# Patient Record
Sex: Male | Born: 1935
Health system: Southern US, Community
[De-identification: ages and names within clinical notes are randomized; demographics above are authoritative.]

## PROBLEM LIST (undated history)

## (undated) DIAGNOSIS — J9819 Other pulmonary collapse: Secondary | ICD-10-CM

## (undated) DIAGNOSIS — T4145XA Adverse effect of unspecified anesthetic, initial encounter: Secondary | ICD-10-CM

## (undated) DIAGNOSIS — Z9289 Personal history of other medical treatment: Secondary | ICD-10-CM

## (undated) DIAGNOSIS — L97929 Non-pressure chronic ulcer of unspecified part of left lower leg with unspecified severity: Secondary | ICD-10-CM

## (undated) DIAGNOSIS — D649 Anemia, unspecified: Secondary | ICD-10-CM

## (undated) DIAGNOSIS — G629 Polyneuropathy, unspecified: Secondary | ICD-10-CM

## (undated) DIAGNOSIS — I2510 Atherosclerotic heart disease of native coronary artery without angina pectoris: Secondary | ICD-10-CM

## (undated) DIAGNOSIS — K219 Gastro-esophageal reflux disease without esophagitis: Secondary | ICD-10-CM

## (undated) DIAGNOSIS — R32 Unspecified urinary incontinence: Secondary | ICD-10-CM

## (undated) DIAGNOSIS — Z8719 Personal history of other diseases of the digestive system: Secondary | ICD-10-CM

## (undated) DIAGNOSIS — C61 Malignant neoplasm of prostate: Secondary | ICD-10-CM

## (undated) DIAGNOSIS — H3530 Unspecified macular degeneration: Secondary | ICD-10-CM

## (undated) DIAGNOSIS — M359 Systemic involvement of connective tissue, unspecified: Secondary | ICD-10-CM

## (undated) DIAGNOSIS — G709 Myoneural disorder, unspecified: Secondary | ICD-10-CM

## (undated) DIAGNOSIS — I10 Essential (primary) hypertension: Secondary | ICD-10-CM

## (undated) DIAGNOSIS — I2699 Other pulmonary embolism without acute cor pulmonale: Secondary | ICD-10-CM

## (undated) DIAGNOSIS — I499 Cardiac arrhythmia, unspecified: Secondary | ICD-10-CM

## (undated) DIAGNOSIS — I495 Sick sinus syndrome: Secondary | ICD-10-CM

## (undated) DIAGNOSIS — M5137 Other intervertebral disc degeneration, lumbosacral region: Secondary | ICD-10-CM

## (undated) DIAGNOSIS — M51379 Other intervertebral disc degeneration, lumbosacral region without mention of lumbar back pain or lower extremity pain: Secondary | ICD-10-CM

## (undated) DIAGNOSIS — I219 Acute myocardial infarction, unspecified: Secondary | ICD-10-CM

## (undated) DIAGNOSIS — I209 Angina pectoris, unspecified: Secondary | ICD-10-CM

## (undated) DIAGNOSIS — F329 Major depressive disorder, single episode, unspecified: Secondary | ICD-10-CM

## (undated) DIAGNOSIS — R600 Localized edema: Secondary | ICD-10-CM

## (undated) DIAGNOSIS — R296 Repeated falls: Secondary | ICD-10-CM

## (undated) DIAGNOSIS — IMO0001 Reserved for inherently not codable concepts without codable children: Secondary | ICD-10-CM

## (undated) DIAGNOSIS — T8859XA Other complications of anesthesia, initial encounter: Secondary | ICD-10-CM

## (undated) DIAGNOSIS — F3289 Other specified depressive episodes: Secondary | ICD-10-CM

## (undated) DIAGNOSIS — Z9989 Dependence on other enabling machines and devices: Secondary | ICD-10-CM

## (undated) DIAGNOSIS — I509 Heart failure, unspecified: Secondary | ICD-10-CM

## (undated) DIAGNOSIS — Z950 Presence of cardiac pacemaker: Secondary | ICD-10-CM

## (undated) DIAGNOSIS — E7800 Pure hypercholesterolemia, unspecified: Secondary | ICD-10-CM

## (undated) DIAGNOSIS — L97919 Non-pressure chronic ulcer of unspecified part of right lower leg with unspecified severity: Secondary | ICD-10-CM

## (undated) DIAGNOSIS — G4733 Obstructive sleep apnea (adult) (pediatric): Secondary | ICD-10-CM

## (undated) DIAGNOSIS — C8590 Non-Hodgkin lymphoma, unspecified, unspecified site: Secondary | ICD-10-CM

## (undated) HISTORY — PX: PACEMAKER INSERTION: SHX728

## (undated) HISTORY — PX: BACK SURGERY: SHX140

## (undated) HISTORY — PX: ENDARTERECTOMY: SHX5162

## (undated) HISTORY — DX: Other intervertebral disc degeneration, lumbosacral region: M51.37

## (undated) HISTORY — PX: EXPLORATORY LAPAROTOMY: SUR591

## (undated) HISTORY — DX: Major depressive disorder, single episode, unspecified: F32.9

## (undated) HISTORY — DX: Sick sinus syndrome: I49.5

## (undated) HISTORY — DX: Pure hypercholesterolemia, unspecified: E78.00

## (undated) HISTORY — DX: Essential (primary) hypertension: I10

## (undated) HISTORY — DX: Other specified depressive episodes: F32.89

## (undated) HISTORY — PX: ROTATOR CUFF REPAIR: SHX139

## (undated) HISTORY — PX: CORONARY ANGIOPLASTY WITH STENT PLACEMENT: SHX49

## (undated) HISTORY — DX: Atherosclerotic heart disease of native coronary artery without angina pectoris: I25.10

## (undated) HISTORY — PX: APPENDECTOMY: SHX54

## (undated) HISTORY — PX: TONSILLECTOMY: SUR1361

## (undated) HISTORY — PX: CORONARY ARTERY BYPASS GRAFT: SHX141

## (undated) HISTORY — PX: LUMBAR FUSION: SHX111

## (undated) HISTORY — DX: Other intervertebral disc degeneration, lumbosacral region without mention of lumbar back pain or lower extremity pain: M51.379

---

## 1997-11-20 ENCOUNTER — Other Ambulatory Visit: Admission: RE | Admit: 1997-11-20 | Discharge: 1997-11-20 | Payer: Self-pay | Admitting: Geriatric Medicine

## 1997-12-06 ENCOUNTER — Ambulatory Visit (HOSPITAL_COMMUNITY): Admission: RE | Admit: 1997-12-06 | Discharge: 1997-12-06 | Payer: Self-pay | Admitting: Neurosurgery

## 1998-01-22 ENCOUNTER — Ambulatory Visit (HOSPITAL_COMMUNITY): Admission: RE | Admit: 1998-01-22 | Discharge: 1998-01-22 | Payer: Self-pay | Admitting: Neurosurgery

## 1998-11-11 ENCOUNTER — Ambulatory Visit (HOSPITAL_COMMUNITY): Admission: AD | Admit: 1998-11-11 | Discharge: 1998-11-12 | Payer: Self-pay | Admitting: Cardiology

## 1999-11-06 ENCOUNTER — Ambulatory Visit (HOSPITAL_COMMUNITY): Admission: RE | Admit: 1999-11-06 | Discharge: 1999-11-06 | Payer: Self-pay | Admitting: Geriatric Medicine

## 1999-11-22 ENCOUNTER — Encounter: Payer: Self-pay | Admitting: *Deleted

## 1999-11-22 ENCOUNTER — Inpatient Hospital Stay (HOSPITAL_COMMUNITY): Admission: EM | Admit: 1999-11-22 | Discharge: 1999-11-26 | Payer: Self-pay | Admitting: *Deleted

## 1999-11-26 ENCOUNTER — Encounter: Payer: Self-pay | Admitting: Cardiology

## 2000-01-01 ENCOUNTER — Ambulatory Visit (HOSPITAL_COMMUNITY): Admission: RE | Admit: 2000-01-01 | Discharge: 2000-01-01 | Payer: Self-pay | Admitting: Psychiatry

## 2000-03-25 ENCOUNTER — Ambulatory Visit (HOSPITAL_COMMUNITY): Admission: RE | Admit: 2000-03-25 | Discharge: 2000-03-25 | Payer: Self-pay | Admitting: Cardiology

## 2000-03-25 ENCOUNTER — Encounter: Payer: Self-pay | Admitting: Cardiology

## 2000-04-27 ENCOUNTER — Ambulatory Visit (HOSPITAL_COMMUNITY): Admission: RE | Admit: 2000-04-27 | Discharge: 2000-04-28 | Payer: Self-pay | Admitting: Cardiology

## 2000-07-07 ENCOUNTER — Other Ambulatory Visit: Admission: RE | Admit: 2000-07-07 | Discharge: 2000-07-07 | Payer: Self-pay | Admitting: Urology

## 2000-07-07 ENCOUNTER — Encounter (INDEPENDENT_AMBULATORY_CARE_PROVIDER_SITE_OTHER): Payer: Self-pay

## 2000-08-05 ENCOUNTER — Ambulatory Visit (HOSPITAL_BASED_OUTPATIENT_CLINIC_OR_DEPARTMENT_OTHER): Admission: RE | Admit: 2000-08-05 | Discharge: 2000-08-05 | Payer: Self-pay | Admitting: Cardiology

## 2000-11-15 ENCOUNTER — Ambulatory Visit (HOSPITAL_COMMUNITY): Admission: RE | Admit: 2000-11-15 | Discharge: 2000-11-15 | Payer: Self-pay | Admitting: Cardiology

## 2001-04-01 ENCOUNTER — Encounter: Admission: RE | Admit: 2001-04-01 | Discharge: 2001-04-01 | Payer: Self-pay | Admitting: Geriatric Medicine

## 2001-04-01 ENCOUNTER — Encounter: Payer: Self-pay | Admitting: Geriatric Medicine

## 2001-09-19 ENCOUNTER — Encounter: Payer: Self-pay | Admitting: Urology

## 2001-09-19 ENCOUNTER — Encounter: Admission: RE | Admit: 2001-09-19 | Discharge: 2001-09-19 | Payer: Self-pay | Admitting: Urology

## 2002-01-13 ENCOUNTER — Encounter (INDEPENDENT_AMBULATORY_CARE_PROVIDER_SITE_OTHER): Payer: Self-pay | Admitting: *Deleted

## 2002-01-13 ENCOUNTER — Inpatient Hospital Stay (HOSPITAL_COMMUNITY): Admission: RE | Admit: 2002-01-13 | Discharge: 2002-01-14 | Payer: Self-pay | Admitting: Vascular Surgery

## 2002-01-13 ENCOUNTER — Encounter: Payer: Self-pay | Admitting: Vascular Surgery

## 2002-05-30 ENCOUNTER — Encounter: Payer: Self-pay | Admitting: Geriatric Medicine

## 2002-05-30 ENCOUNTER — Encounter: Admission: RE | Admit: 2002-05-30 | Discharge: 2002-05-30 | Payer: Self-pay | Admitting: Geriatric Medicine

## 2002-06-20 ENCOUNTER — Encounter: Payer: Self-pay | Admitting: Neurological Surgery

## 2002-06-20 ENCOUNTER — Inpatient Hospital Stay (HOSPITAL_COMMUNITY): Admission: RE | Admit: 2002-06-20 | Discharge: 2002-06-29 | Payer: Self-pay | Admitting: Neurological Surgery

## 2003-08-07 ENCOUNTER — Ambulatory Visit (HOSPITAL_COMMUNITY): Admission: RE | Admit: 2003-08-07 | Discharge: 2003-08-07 | Payer: Self-pay | Admitting: Geriatric Medicine

## 2004-01-14 ENCOUNTER — Ambulatory Visit: Admission: RE | Admit: 2004-01-14 | Discharge: 2004-02-29 | Payer: Self-pay | Admitting: Radiation Oncology

## 2004-01-17 ENCOUNTER — Ambulatory Visit (HOSPITAL_COMMUNITY): Admission: RE | Admit: 2004-01-17 | Discharge: 2004-01-17 | Payer: Self-pay | Admitting: Urology

## 2004-08-14 ENCOUNTER — Encounter: Admission: RE | Admit: 2004-08-14 | Discharge: 2004-08-14 | Payer: Self-pay | Admitting: Neurology

## 2004-11-06 ENCOUNTER — Ambulatory Visit (HOSPITAL_COMMUNITY): Admission: RE | Admit: 2004-11-06 | Discharge: 2004-11-06 | Payer: Self-pay | Admitting: Urology

## 2004-11-28 ENCOUNTER — Encounter (HOSPITAL_COMMUNITY): Admission: RE | Admit: 2004-11-28 | Discharge: 2004-12-05 | Payer: Self-pay | Admitting: Urology

## 2004-12-03 ENCOUNTER — Ambulatory Visit: Payer: Self-pay | Admitting: Oncology

## 2004-12-09 ENCOUNTER — Ambulatory Visit: Admission: RE | Admit: 2004-12-09 | Discharge: 2005-01-30 | Payer: Self-pay | Admitting: Radiation Oncology

## 2005-11-23 ENCOUNTER — Inpatient Hospital Stay (HOSPITAL_COMMUNITY): Admission: EM | Admit: 2005-11-23 | Discharge: 2005-11-24 | Payer: Self-pay | Admitting: Emergency Medicine

## 2006-04-20 ENCOUNTER — Ambulatory Visit: Payer: Self-pay | Admitting: Oncology

## 2006-04-22 LAB — CBC WITH DIFFERENTIAL/PLATELET
BASO%: 0.3 % (ref 0.0–2.0)
HCT: 32.7 % — ABNORMAL LOW (ref 38.7–49.9)
MCHC: 34.5 g/dL (ref 32.0–35.9)
MONO#: 0.5 10*3/uL (ref 0.1–0.9)
NEUT#: 3.6 10*3/uL (ref 1.5–6.5)
NEUT%: 60 % (ref 40.0–75.0)
WBC: 6 10*3/uL (ref 4.0–10.0)
lymph#: 1.6 10*3/uL (ref 0.9–3.3)

## 2006-04-23 LAB — CK: Total CK: 341 U/L — ABNORMAL HIGH (ref 7–232)

## 2006-04-26 LAB — FERRITIN: Ferritin: 43 ng/mL (ref 22–322)

## 2006-04-26 LAB — COMPREHENSIVE METABOLIC PANEL
Albumin: 4.2 g/dL (ref 3.5–5.2)
BUN: 34 mg/dL — ABNORMAL HIGH (ref 6–23)
CO2: 28 mEq/L (ref 19–32)
Glucose, Bld: 92 mg/dL (ref 70–99)
Sodium: 140 mEq/L (ref 135–145)
Total Bilirubin: 0.5 mg/dL (ref 0.3–1.2)
Total Protein: 7.1 g/dL (ref 6.0–8.3)

## 2006-04-26 LAB — IMMUNOFIXATION ELECTROPHORESIS
IgA: 584 mg/dL — ABNORMAL HIGH (ref 68–378)
Total Protein, Serum Electrophoresis: 7.1 g/dL (ref 6.0–8.3)

## 2006-04-26 LAB — LACTATE DEHYDROGENASE: LDH: 209 U/L (ref 94–250)

## 2006-05-03 ENCOUNTER — Encounter: Admission: RE | Admit: 2006-05-03 | Discharge: 2006-05-03 | Payer: Self-pay | Admitting: Geriatric Medicine

## 2006-05-06 LAB — COMPREHENSIVE METABOLIC PANEL
ALT: 24 U/L (ref 0–40)
BUN: 24 mg/dL — ABNORMAL HIGH (ref 6–23)
CO2: 29 mEq/L (ref 19–32)
Calcium: 10 mg/dL (ref 8.4–10.5)
Chloride: 106 mEq/L (ref 96–112)
Creatinine, Ser: 1.36 mg/dL (ref 0.40–1.50)
Glucose, Bld: 108 mg/dL — ABNORMAL HIGH (ref 70–99)

## 2006-05-06 LAB — CBC WITH DIFFERENTIAL/PLATELET
BASO%: 0.3 % (ref 0.0–2.0)
Basophils Absolute: 0 10*3/uL (ref 0.0–0.1)
Eosinophils Absolute: 0.3 10*3/uL (ref 0.0–0.5)
HCT: 31.1 % — ABNORMAL LOW (ref 38.7–49.9)
HGB: 10.6 g/dL — ABNORMAL LOW (ref 13.0–17.1)
MONO#: 0.5 10*3/uL (ref 0.1–0.9)
NEUT#: 3.3 10*3/uL (ref 1.5–6.5)
NEUT%: 61.6 % (ref 40.0–75.0)
Platelets: 152 10*3/uL (ref 145–400)
WBC: 5.4 10*3/uL (ref 4.0–10.0)
lymph#: 1.2 10*3/uL (ref 0.9–3.3)

## 2006-05-18 ENCOUNTER — Encounter (INDEPENDENT_AMBULATORY_CARE_PROVIDER_SITE_OTHER): Payer: Self-pay | Admitting: Specialist

## 2006-05-18 ENCOUNTER — Encounter (INDEPENDENT_AMBULATORY_CARE_PROVIDER_SITE_OTHER): Payer: Self-pay | Admitting: Interventional Radiology

## 2006-05-18 ENCOUNTER — Ambulatory Visit (HOSPITAL_COMMUNITY): Admission: RE | Admit: 2006-05-18 | Discharge: 2006-05-18 | Payer: Self-pay | Admitting: Oncology

## 2006-05-25 ENCOUNTER — Encounter (INDEPENDENT_AMBULATORY_CARE_PROVIDER_SITE_OTHER): Payer: Self-pay | Admitting: Specialist

## 2006-05-25 ENCOUNTER — Other Ambulatory Visit: Admission: RE | Admit: 2006-05-25 | Discharge: 2006-05-25 | Payer: Self-pay | Admitting: Oncology

## 2006-05-25 ENCOUNTER — Encounter: Payer: Self-pay | Admitting: Oncology

## 2006-06-07 ENCOUNTER — Ambulatory Visit: Payer: Self-pay | Admitting: Oncology

## 2006-07-06 ENCOUNTER — Encounter (INDEPENDENT_AMBULATORY_CARE_PROVIDER_SITE_OTHER): Payer: Self-pay | Admitting: Interventional Radiology

## 2006-07-06 ENCOUNTER — Encounter (INDEPENDENT_AMBULATORY_CARE_PROVIDER_SITE_OTHER): Payer: Self-pay | Admitting: Specialist

## 2006-07-06 ENCOUNTER — Ambulatory Visit (HOSPITAL_COMMUNITY): Admission: RE | Admit: 2006-07-06 | Discharge: 2006-07-06 | Payer: Self-pay | Admitting: Oncology

## 2006-07-14 LAB — COMPREHENSIVE METABOLIC PANEL
BUN: 37 mg/dL — ABNORMAL HIGH (ref 6–23)
CO2: 29 mEq/L (ref 19–32)
Calcium: 10.2 mg/dL (ref 8.4–10.5)
Chloride: 104 mEq/L (ref 96–112)
Creatinine, Ser: 1.52 mg/dL — ABNORMAL HIGH (ref 0.40–1.50)
Total Bilirubin: 0.5 mg/dL (ref 0.3–1.2)

## 2006-07-14 LAB — CBC WITH DIFFERENTIAL/PLATELET
Basophils Absolute: 0 10*3/uL (ref 0.0–0.1)
HCT: 31.4 % — ABNORMAL LOW (ref 38.7–49.9)
HGB: 10.7 g/dL — ABNORMAL LOW (ref 13.0–17.1)
LYMPH%: 29.4 % (ref 14.0–48.0)
MCH: 30.6 pg (ref 28.0–33.4)
MCHC: 34.1 g/dL (ref 32.0–35.9)
MONO#: 0.7 10*3/uL (ref 0.1–0.9)
NEUT%: 55.2 % (ref 40.0–75.0)
Platelets: 145 10*3/uL (ref 145–400)
lymph#: 1.7 10*3/uL (ref 0.9–3.3)

## 2006-07-28 ENCOUNTER — Ambulatory Visit (HOSPITAL_COMMUNITY): Admission: RE | Admit: 2006-07-28 | Discharge: 2006-07-28 | Payer: Self-pay | Admitting: Oncology

## 2006-07-29 ENCOUNTER — Ambulatory Visit: Payer: Self-pay | Admitting: Oncology

## 2006-08-03 LAB — CBC WITH DIFFERENTIAL/PLATELET
Eosinophils Absolute: 0.2 10*3/uL (ref 0.0–0.5)
HCT: 32.2 % — ABNORMAL LOW (ref 38.7–49.9)
LYMPH%: 25.8 % (ref 14.0–48.0)
MONO#: 0.5 10*3/uL (ref 0.1–0.9)
NEUT#: 3.7 10*3/uL (ref 1.5–6.5)
Platelets: 135 10*3/uL — ABNORMAL LOW (ref 145–400)
RBC: 3.61 10*6/uL — ABNORMAL LOW (ref 4.20–5.71)
WBC: 5.9 10*3/uL (ref 4.0–10.0)
lymph#: 1.5 10*3/uL (ref 0.9–3.3)

## 2006-08-03 LAB — COMPREHENSIVE METABOLIC PANEL
ALT: 16 U/L (ref 0–53)
Albumin: 4.2 g/dL (ref 3.5–5.2)
CO2: 28 mEq/L (ref 19–32)
Calcium: 10.4 mg/dL (ref 8.4–10.5)
Chloride: 102 mEq/L (ref 96–112)
Glucose, Bld: 123 mg/dL — ABNORMAL HIGH (ref 70–99)
Potassium: 4.3 mEq/L (ref 3.5–5.3)
Sodium: 139 mEq/L (ref 135–145)
Total Bilirubin: 0.4 mg/dL (ref 0.3–1.2)
Total Protein: 6.9 g/dL (ref 6.0–8.3)

## 2006-10-19 ENCOUNTER — Inpatient Hospital Stay (HOSPITAL_COMMUNITY): Admission: AD | Admit: 2006-10-19 | Discharge: 2006-10-23 | Payer: Self-pay | Admitting: Cardiology

## 2006-10-20 ENCOUNTER — Ambulatory Visit: Payer: Self-pay | Admitting: Cardiothoracic Surgery

## 2006-10-28 ENCOUNTER — Ambulatory Visit: Payer: Self-pay | Admitting: Oncology

## 2006-11-02 LAB — CBC WITH DIFFERENTIAL/PLATELET
BASO%: 0.2 % (ref 0.0–2.0)
Eosinophils Absolute: 0.2 10*3/uL (ref 0.0–0.5)
MCHC: 35.2 g/dL (ref 32.0–35.9)
MONO#: 0.6 10*3/uL (ref 0.1–0.9)
NEUT#: 2.9 10*3/uL (ref 1.5–6.5)
RBC: 3.43 10*6/uL — ABNORMAL LOW (ref 4.20–5.71)
RDW: 14.9 % — ABNORMAL HIGH (ref 11.2–14.6)
WBC: 5.1 10*3/uL (ref 4.0–10.0)
lymph#: 1.4 10*3/uL (ref 0.9–3.3)

## 2006-11-02 LAB — COMPREHENSIVE METABOLIC PANEL
ALT: 15 U/L (ref 0–53)
Albumin: 4 g/dL (ref 3.5–5.2)
CO2: 24 mEq/L (ref 19–32)
Glucose, Bld: 94 mg/dL (ref 70–99)
Potassium: 4.6 mEq/L (ref 3.5–5.3)
Sodium: 140 mEq/L (ref 135–145)
Total Bilirubin: 0.4 mg/dL (ref 0.3–1.2)
Total Protein: 6.2 g/dL (ref 6.0–8.3)

## 2007-01-27 ENCOUNTER — Ambulatory Visit: Payer: Self-pay | Admitting: Oncology

## 2007-02-01 LAB — CBC WITH DIFFERENTIAL/PLATELET
BASO%: 0.3 % (ref 0.0–2.0)
Basophils Absolute: 0 10*3/uL (ref 0.0–0.1)
EOS%: 3 % (ref 0.0–7.0)
HGB: 11.1 g/dL — ABNORMAL LOW (ref 13.0–17.1)
MCH: 31 pg (ref 28.0–33.4)
MCHC: 35.1 g/dL (ref 32.0–35.9)
MONO%: 11.3 % (ref 0.0–13.0)
RBC: 3.59 10*6/uL — ABNORMAL LOW (ref 4.20–5.71)
RDW: 15.2 % — ABNORMAL HIGH (ref 11.2–14.6)
lymph#: 1.3 10*3/uL (ref 0.9–3.3)

## 2007-02-01 LAB — BASIC METABOLIC PANEL
Chloride: 106 mEq/L (ref 96–112)
Potassium: 4.1 mEq/L (ref 3.5–5.3)
Sodium: 140 mEq/L (ref 135–145)

## 2007-02-01 LAB — LACTATE DEHYDROGENASE: LDH: 209 U/L (ref 94–250)

## 2007-03-23 ENCOUNTER — Ambulatory Visit: Payer: Self-pay | Admitting: Oncology

## 2007-03-25 LAB — COMPREHENSIVE METABOLIC PANEL
ALT: 17 U/L (ref 0–53)
CO2: 26 mEq/L (ref 19–32)
Calcium: 10.3 mg/dL (ref 8.4–10.5)
Chloride: 102 mEq/L (ref 96–112)
Creatinine, Ser: 1.66 mg/dL — ABNORMAL HIGH (ref 0.40–1.50)
Glucose, Bld: 103 mg/dL — ABNORMAL HIGH (ref 70–99)
Sodium: 138 mEq/L (ref 135–145)
Total Bilirubin: 0.6 mg/dL (ref 0.3–1.2)
Total Protein: 6.6 g/dL (ref 6.0–8.3)

## 2007-03-25 LAB — CBC WITH DIFFERENTIAL/PLATELET
BASO%: 0.3 % (ref 0.0–2.0)
Eosinophils Absolute: 0.2 10*3/uL (ref 0.0–0.5)
HCT: 32.1 % — ABNORMAL LOW (ref 38.7–49.9)
HGB: 11.2 g/dL — ABNORMAL LOW (ref 13.0–17.1)
LYMPH%: 24.5 % (ref 14.0–48.0)
MONO#: 0.5 10*3/uL (ref 0.1–0.9)
NEUT#: 3 10*3/uL (ref 1.5–6.5)
NEUT%: 61.7 % (ref 40.0–75.0)
Platelets: 125 10*3/uL — ABNORMAL LOW (ref 145–400)
WBC: 4.8 10*3/uL (ref 4.0–10.0)
lymph#: 1.2 10*3/uL (ref 0.9–3.3)

## 2007-03-30 ENCOUNTER — Ambulatory Visit (HOSPITAL_COMMUNITY): Admission: RE | Admit: 2007-03-30 | Discharge: 2007-03-30 | Payer: Self-pay | Admitting: Oncology

## 2007-05-09 ENCOUNTER — Ambulatory Visit (HOSPITAL_COMMUNITY): Admission: RE | Admit: 2007-05-09 | Discharge: 2007-05-09 | Payer: Self-pay | Admitting: Cardiology

## 2007-05-12 ENCOUNTER — Ambulatory Visit: Payer: Self-pay | Admitting: Oncology

## 2007-05-17 LAB — CBC WITH DIFFERENTIAL/PLATELET
Basophils Absolute: 0 10*3/uL (ref 0.0–0.1)
Eosinophils Absolute: 0.1 10*3/uL (ref 0.0–0.5)
HGB: 10.7 g/dL — ABNORMAL LOW (ref 13.0–17.1)
MCV: 88.7 fL (ref 81.6–98.0)
MONO#: 0.5 10*3/uL (ref 0.1–0.9)
MONO%: 9.6 % (ref 0.0–13.0)
NEUT#: 3.4 10*3/uL (ref 1.5–6.5)
Platelets: 125 10*3/uL — ABNORMAL LOW (ref 145–400)
RDW: 15.1 % — ABNORMAL HIGH (ref 11.2–14.6)
WBC: 5.2 10*3/uL (ref 4.0–10.0)

## 2007-05-17 LAB — BASIC METABOLIC PANEL
BUN: 26 mg/dL — ABNORMAL HIGH (ref 6–23)
CO2: 27 mEq/L (ref 19–32)
Glucose, Bld: 81 mg/dL (ref 70–99)
Potassium: 4.6 mEq/L (ref 3.5–5.3)
Sodium: 142 mEq/L (ref 135–145)

## 2007-06-21 ENCOUNTER — Ambulatory Visit: Payer: Self-pay | Admitting: Oncology

## 2007-06-21 LAB — COMPREHENSIVE METABOLIC PANEL
ALT: 19 U/L (ref 0–53)
Alkaline Phosphatase: 58 U/L (ref 39–117)
CO2: 31 mEq/L (ref 19–32)
Creatinine, Ser: 1.47 mg/dL (ref 0.40–1.50)
Sodium: 138 mEq/L (ref 135–145)
Total Bilirubin: 0.7 mg/dL (ref 0.3–1.2)
Total Protein: 6.3 g/dL (ref 6.0–8.3)

## 2007-06-21 LAB — CBC WITH DIFFERENTIAL/PLATELET
BASO%: 0.7 % (ref 0.0–2.0)
EOS%: 3.9 % (ref 0.0–7.0)
HCT: 32 % — ABNORMAL LOW (ref 38.7–49.9)
LYMPH%: 27.7 % (ref 14.0–48.0)
MCH: 31.1 pg (ref 28.0–33.4)
MCHC: 34.9 g/dL (ref 32.0–35.9)
MCV: 89.2 fL (ref 81.6–98.0)
MONO#: 0.5 10*3/uL (ref 0.1–0.9)
MONO%: 9.3 % (ref 0.0–13.0)
NEUT%: 58.4 % (ref 40.0–75.0)
Platelets: 116 10*3/uL — ABNORMAL LOW (ref 145–400)
RBC: 3.59 10*6/uL — ABNORMAL LOW (ref 4.20–5.71)
WBC: 5.2 10*3/uL (ref 4.0–10.0)

## 2007-06-24 LAB — LACTATE DEHYDROGENASE: LDH: 174 U/L (ref 94–250)

## 2007-06-24 LAB — COMPREHENSIVE METABOLIC PANEL
AST: 31 U/L (ref 0–37)
Alkaline Phosphatase: 58 U/L (ref 39–117)
BUN: 30 mg/dL — ABNORMAL HIGH (ref 6–23)
Calcium: 10.4 mg/dL (ref 8.4–10.5)
Chloride: 105 mEq/L (ref 96–112)
Creatinine, Ser: 1.71 mg/dL — ABNORMAL HIGH (ref 0.40–1.50)

## 2007-06-29 LAB — PTH RELATED PEPTIDE: PTH-related peptide: <0.2 pmol/L (ref <1.4)

## 2007-07-11 LAB — CBC WITH DIFFERENTIAL/PLATELET
BASO%: 0.3 % (ref 0.0–2.0)
HCT: 31.7 % — ABNORMAL LOW (ref 38.7–49.9)
MCHC: 35.3 g/dL (ref 32.0–35.9)
MONO#: 0.3 10*3/uL (ref 0.1–0.9)
NEUT%: 60.6 % (ref 40.0–75.0)
WBC: 3.2 10*3/uL — ABNORMAL LOW (ref 4.0–10.0)
lymph#: 0.9 10*3/uL (ref 0.9–3.3)

## 2007-07-20 LAB — COMPREHENSIVE METABOLIC PANEL
ALT: 27 U/L (ref 0–53)
Albumin: 4 g/dL (ref 3.5–5.2)
Alkaline Phosphatase: 57 U/L (ref 39–117)
Glucose, Bld: 101 mg/dL — ABNORMAL HIGH (ref 70–99)
Potassium: 4.4 mEq/L (ref 3.5–5.3)
Sodium: 141 mEq/L (ref 135–145)
Total Protein: 6.5 g/dL (ref 6.0–8.3)

## 2007-07-20 LAB — CBC WITH DIFFERENTIAL/PLATELET
Basophils Absolute: 0 10*3/uL (ref 0.0–0.1)
Eosinophils Absolute: 0.1 10*3/uL (ref 0.0–0.5)
HCT: 31.4 % — ABNORMAL LOW (ref 38.7–49.9)
HGB: 11 g/dL — ABNORMAL LOW (ref 13.0–17.1)
MCH: 30.8 pg (ref 28.0–33.4)
MONO#: 0.6 10*3/uL (ref 0.1–0.9)
NEUT#: 1.4 10*3/uL — ABNORMAL LOW (ref 1.5–6.5)
NEUT%: 37 % — ABNORMAL LOW (ref 40.0–75.0)
WBC: 3.8 10*3/uL — ABNORMAL LOW (ref 4.0–10.0)
lymph#: 1.6 10*3/uL (ref 0.9–3.3)

## 2007-07-22 LAB — CBC WITH DIFFERENTIAL/PLATELET
BASO%: 0.7 % (ref 0.0–2.0)
EOS%: 3.6 % (ref 0.0–7.0)
Eosinophils Absolute: 0.1 10*3/uL (ref 0.0–0.5)
LYMPH%: 33.2 % (ref 14.0–48.0)
MCH: 30.8 pg (ref 28.0–33.4)
MCHC: 34.9 g/dL (ref 32.0–35.9)
MCV: 88.2 fL (ref 81.6–98.0)
MONO%: 11.9 % (ref 0.0–13.0)
NEUT#: 2.1 10*3/uL (ref 1.5–6.5)
Platelets: 143 10*3/uL — ABNORMAL LOW (ref 145–400)
RBC: 3.42 10*6/uL — ABNORMAL LOW (ref 4.20–5.71)
RDW: 12.1 % (ref 11.2–14.6)

## 2007-08-08 ENCOUNTER — Ambulatory Visit: Payer: Self-pay | Admitting: Oncology

## 2007-08-09 ENCOUNTER — Ambulatory Visit: Payer: Self-pay | Admitting: Dentistry

## 2007-08-09 ENCOUNTER — Encounter: Admission: AD | Admit: 2007-08-09 | Discharge: 2007-08-09 | Payer: Self-pay | Admitting: Dentistry

## 2007-08-10 LAB — COMPREHENSIVE METABOLIC PANEL
ALT: 23 U/L (ref 0–53)
Albumin: 3.9 g/dL (ref 3.5–5.2)
Alkaline Phosphatase: 54 U/L (ref 39–117)
CO2: 26 mEq/L (ref 19–32)
Glucose, Bld: 136 mg/dL — ABNORMAL HIGH (ref 70–99)
Potassium: 4.2 mEq/L (ref 3.5–5.3)
Sodium: 141 mEq/L (ref 135–145)
Total Bilirubin: 0.4 mg/dL (ref 0.3–1.2)
Total Protein: 6.4 g/dL (ref 6.0–8.3)

## 2007-08-10 LAB — CBC WITH DIFFERENTIAL/PLATELET
BASO%: 0.5 % (ref 0.0–2.0)
Eosinophils Absolute: 0.2 10*3/uL (ref 0.0–0.5)
LYMPH%: 39.5 % (ref 14.0–48.0)
MCHC: 34.7 g/dL (ref 32.0–35.9)
MONO#: 0.6 10*3/uL (ref 0.1–0.9)
NEUT#: 1.6 10*3/uL (ref 1.5–6.5)
RBC: 3.6 10*6/uL — ABNORMAL LOW (ref 4.20–5.71)
RDW: 16 % — ABNORMAL HIGH (ref 11.2–14.6)
WBC: 3.9 10*3/uL — ABNORMAL LOW (ref 4.0–10.0)

## 2007-08-11 ENCOUNTER — Ambulatory Visit (HOSPITAL_COMMUNITY): Admission: RE | Admit: 2007-08-11 | Discharge: 2007-08-11 | Payer: Self-pay | Admitting: Oncology

## 2007-09-01 LAB — COMPREHENSIVE METABOLIC PANEL
ALT: 27 U/L (ref 0–53)
Albumin: 4.1 g/dL (ref 3.5–5.2)
Alkaline Phosphatase: 64 U/L (ref 39–117)
Glucose, Bld: 107 mg/dL — ABNORMAL HIGH (ref 70–99)
Potassium: 4.1 mEq/L (ref 3.5–5.3)
Sodium: 139 mEq/L (ref 135–145)
Total Protein: 6.6 g/dL (ref 6.0–8.3)

## 2007-09-01 LAB — CBC WITH DIFFERENTIAL/PLATELET
BASO%: 0.2 % (ref 0.0–2.0)
Eosinophils Absolute: 0.2 10*3/uL (ref 0.0–0.5)
MCHC: 35.8 g/dL (ref 32.0–35.9)
MONO#: 0.8 10*3/uL (ref 0.1–0.9)
MONO%: 16 % — ABNORMAL HIGH (ref 0.0–13.0)
NEUT#: 2.5 10*3/uL (ref 1.5–6.5)
RBC: 3.45 10*6/uL — ABNORMAL LOW (ref 4.20–5.71)
RDW: 17.7 % — ABNORMAL HIGH (ref 11.2–14.6)
WBC: 5.3 10*3/uL (ref 4.0–10.0)

## 2007-09-19 ENCOUNTER — Ambulatory Visit (HOSPITAL_COMMUNITY): Admission: RE | Admit: 2007-09-19 | Discharge: 2007-09-19 | Payer: Self-pay | Admitting: Oncology

## 2007-09-20 ENCOUNTER — Ambulatory Visit: Payer: Self-pay | Admitting: Oncology

## 2007-09-22 LAB — COMPREHENSIVE METABOLIC PANEL
ALT: 22 U/L (ref 0–53)
AST: 26 U/L (ref 0–37)
BUN: 26 mg/dL — ABNORMAL HIGH (ref 6–23)
Calcium: 10 mg/dL (ref 8.4–10.5)
Chloride: 102 mEq/L (ref 96–112)
Creatinine, Ser: 1.43 mg/dL (ref 0.40–1.50)
Total Bilirubin: 0.5 mg/dL (ref 0.3–1.2)

## 2007-09-22 LAB — CBC WITH DIFFERENTIAL/PLATELET
BASO%: 0.2 % (ref 0.0–2.0)
EOS%: 5.7 % (ref 0.0–7.0)
Eosinophils Absolute: 0.3 10*3/uL (ref 0.0–0.5)
LYMPH%: 26.4 % (ref 14.0–48.0)
MCH: 31.6 pg (ref 28.0–33.4)
MCHC: 35.4 g/dL (ref 32.0–35.9)
MCV: 89.3 fL (ref 81.6–98.0)
MONO%: 7.9 % (ref 0.0–13.0)
NEUT#: 3.3 10*3/uL (ref 1.5–6.5)
Platelets: 245 10*3/uL (ref 145–400)
RBC: 3.23 10*6/uL — ABNORMAL LOW (ref 4.20–5.71)
RDW: 17.2 % — ABNORMAL HIGH (ref 11.2–14.6)

## 2007-11-16 ENCOUNTER — Ambulatory Visit: Payer: Self-pay | Admitting: Oncology

## 2007-11-18 LAB — CBC WITH DIFFERENTIAL/PLATELET
Eosinophils Absolute: 0.3 10*3/uL (ref 0.0–0.5)
MONO#: 0.5 10*3/uL (ref 0.1–0.9)
NEUT#: 3.4 10*3/uL (ref 1.5–6.5)
Platelets: 163 10*3/uL (ref 145–400)
RBC: 3.08 10*6/uL — ABNORMAL LOW (ref 4.20–5.71)
RDW: 17.1 % — ABNORMAL HIGH (ref 11.2–14.6)
WBC: 5.1 10*3/uL (ref 4.0–10.0)
lymph#: 0.9 10*3/uL (ref 0.9–3.3)

## 2007-11-18 LAB — COMPREHENSIVE METABOLIC PANEL
Albumin: 4 g/dL (ref 3.5–5.2)
CO2: 24 mEq/L (ref 19–32)
Chloride: 106 mEq/L (ref 96–112)
Glucose, Bld: 107 mg/dL — ABNORMAL HIGH (ref 70–99)
Potassium: 3.9 mEq/L (ref 3.5–5.3)
Sodium: 139 mEq/L (ref 135–145)
Total Protein: 6.4 g/dL (ref 6.0–8.3)

## 2008-01-04 ENCOUNTER — Ambulatory Visit: Payer: Self-pay | Admitting: Oncology

## 2008-01-09 LAB — CBC WITH DIFFERENTIAL/PLATELET
EOS%: 5.3 % (ref 0.0–7.0)
Eosinophils Absolute: 0.2 10*3/uL (ref 0.0–0.5)
LYMPH%: 34.7 % (ref 14.0–48.0)
MCH: 30.8 pg (ref 28.0–33.4)
MCV: 88.3 fL (ref 81.6–98.0)
MONO%: 17 % — ABNORMAL HIGH (ref 0.0–13.0)
NEUT#: 1.4 10*3/uL — ABNORMAL LOW (ref 1.5–6.5)
Platelets: 138 10*3/uL — ABNORMAL LOW (ref 145–400)
RBC: 3.44 10*6/uL — ABNORMAL LOW (ref 4.20–5.71)

## 2008-01-24 ENCOUNTER — Inpatient Hospital Stay (HOSPITAL_COMMUNITY): Admission: EM | Admit: 2008-01-24 | Discharge: 2008-01-27 | Payer: Self-pay | Admitting: Emergency Medicine

## 2008-01-26 ENCOUNTER — Ambulatory Visit: Payer: Self-pay | Admitting: Oncology

## 2008-01-31 LAB — CBC WITH DIFFERENTIAL/PLATELET
BASO%: 0.8 % (ref 0.0–2.0)
EOS%: 11.2 % — ABNORMAL HIGH (ref 0.0–7.0)
HCT: 29.5 % — ABNORMAL LOW (ref 38.7–49.9)
MCH: 30.8 pg (ref 28.0–33.4)
MCHC: 35.4 g/dL (ref 32.0–35.9)
MONO#: 0.5 10*3/uL (ref 0.1–0.9)
RDW: 14.9 % — ABNORMAL HIGH (ref 11.2–14.6)
WBC: 1.9 10*3/uL — ABNORMAL LOW (ref 4.0–10.0)
lymph#: 0.9 10*3/uL (ref 0.9–3.3)

## 2008-02-08 ENCOUNTER — Ambulatory Visit: Payer: Self-pay | Admitting: Oncology

## 2008-02-10 LAB — CBC WITH DIFFERENTIAL/PLATELET
Basophils Absolute: 0 10*3/uL (ref 0.0–0.1)
EOS%: 4.1 % (ref 0.0–7.0)
Eosinophils Absolute: 0.3 10*3/uL (ref 0.0–0.5)
HCT: 31 % — ABNORMAL LOW (ref 38.7–49.9)
HGB: 10.8 g/dL — ABNORMAL LOW (ref 13.0–17.1)
MONO#: 0.7 10*3/uL (ref 0.1–0.9)
NEUT#: 4.8 10*3/uL (ref 1.5–6.5)
NEUT%: 68.8 % (ref 40.0–75.0)
RDW: 15 % — ABNORMAL HIGH (ref 11.2–14.6)
WBC: 6.9 10*3/uL (ref 4.0–10.0)
lymph#: 1.2 10*3/uL (ref 0.9–3.3)

## 2008-02-10 LAB — BASIC METABOLIC PANEL
BUN: 29 mg/dL — ABNORMAL HIGH (ref 6–23)
CO2: 25 mEq/L (ref 19–32)
Chloride: 103 mEq/L (ref 96–112)
Glucose, Bld: 105 mg/dL — ABNORMAL HIGH (ref 70–99)
Potassium: 4.4 mEq/L (ref 3.5–5.3)

## 2008-03-15 LAB — CBC WITH DIFFERENTIAL/PLATELET
BASO%: 0.1 % (ref 0.0–2.0)
EOS%: 3.8 % (ref 0.0–7.0)
HCT: 32 % — ABNORMAL LOW (ref 38.7–49.9)
LYMPH%: 18.5 % (ref 14.0–48.0)
MCH: 30.9 pg (ref 28.0–33.4)
MCHC: 34.7 g/dL (ref 32.0–35.9)
MONO#: 0.7 10*3/uL (ref 0.1–0.9)
NEUT%: 66.4 % (ref 40.0–75.0)
Platelets: 117 10*3/uL — ABNORMAL LOW (ref 145–400)
RBC: 3.6 10*6/uL — ABNORMAL LOW (ref 4.20–5.71)
WBC: 5.9 10*3/uL (ref 4.0–10.0)
lymph#: 1.1 10*3/uL (ref 0.9–3.3)

## 2008-04-13 ENCOUNTER — Ambulatory Visit: Payer: Self-pay | Admitting: Oncology

## 2008-04-17 LAB — CBC WITH DIFFERENTIAL/PLATELET
Basophils Absolute: 0 10*3/uL (ref 0.0–0.1)
Eosinophils Absolute: 0.3 10*3/uL (ref 0.0–0.5)
HCT: 32.9 % — ABNORMAL LOW (ref 38.7–49.9)
HGB: 11.5 g/dL — ABNORMAL LOW (ref 13.0–17.1)
LYMPH%: 22.5 % (ref 14.0–48.0)
MCV: 89.4 fL (ref 81.6–98.0)
MONO#: 0.6 10*3/uL (ref 0.1–0.9)
MONO%: 10.2 % (ref 0.0–13.0)
NEUT#: 3.6 10*3/uL (ref 1.5–6.5)
NEUT%: 62.7 % (ref 40.0–75.0)
Platelets: 128 10*3/uL — ABNORMAL LOW (ref 145–400)
WBC: 5.8 10*3/uL (ref 4.0–10.0)

## 2008-04-17 LAB — BASIC METABOLIC PANEL
BUN: 34 mg/dL — ABNORMAL HIGH (ref 6–23)
CO2: 26 mEq/L (ref 19–32)
Calcium: 10.3 mg/dL (ref 8.4–10.5)
Glucose, Bld: 111 mg/dL — ABNORMAL HIGH (ref 70–99)
Sodium: 140 mEq/L (ref 135–145)

## 2008-07-05 ENCOUNTER — Ambulatory Visit: Payer: Self-pay | Admitting: Oncology

## 2008-07-09 LAB — CBC WITH DIFFERENTIAL/PLATELET
BASO%: 0.3 % (ref 0.0–2.0)
Basophils Absolute: 0 10*3/uL (ref 0.0–0.1)
EOS%: 4.1 % (ref 0.0–7.0)
HGB: 11.1 g/dL — ABNORMAL LOW (ref 13.0–17.1)
MCH: 32.2 pg (ref 28.0–33.4)
MONO%: 9 % (ref 0.0–13.0)
RBC: 3.44 10*6/uL — ABNORMAL LOW (ref 4.20–5.71)
RDW: 15.6 % — ABNORMAL HIGH (ref 11.2–14.6)
lymph#: 1.4 10*3/uL (ref 0.9–3.3)

## 2008-07-09 LAB — BASIC METABOLIC PANEL
BUN: 27 mg/dL — ABNORMAL HIGH (ref 6–23)
Chloride: 108 mEq/L (ref 96–112)
Potassium: 4 mEq/L (ref 3.5–5.3)
Sodium: 140 mEq/L (ref 135–145)

## 2008-10-05 ENCOUNTER — Ambulatory Visit: Payer: Self-pay | Admitting: Oncology

## 2008-10-09 LAB — CBC WITH DIFFERENTIAL/PLATELET
BASO%: 0 % (ref 0.0–2.0)
Basophils Absolute: 0 10*3/uL (ref 0.0–0.1)
EOS%: 3.9 % (ref 0.0–7.0)
HCT: 34.2 % — ABNORMAL LOW (ref 38.4–49.9)
LYMPH%: 22.4 % (ref 14.0–49.0)
MCH: 32 pg (ref 27.2–33.4)
MCHC: 34.1 g/dL (ref 32.0–36.0)
MONO#: 0.7 10*3/uL (ref 0.1–0.9)
NEUT%: 64.2 % (ref 39.0–75.0)
Platelets: 129 10*3/uL — ABNORMAL LOW (ref 140–400)

## 2008-10-09 LAB — BASIC METABOLIC PANEL
BUN: 23 mg/dL (ref 6–23)
CO2: 28 mEq/L (ref 19–32)
Calcium: 10.5 mg/dL (ref 8.4–10.5)
Creatinine, Ser: 1.29 mg/dL (ref 0.40–1.50)

## 2009-01-03 ENCOUNTER — Ambulatory Visit: Payer: Self-pay | Admitting: Oncology

## 2009-01-07 LAB — BASIC METABOLIC PANEL
CO2: 25 mEq/L (ref 19–32)
Glucose, Bld: 105 mg/dL — ABNORMAL HIGH (ref 70–99)
Potassium: 4.2 mEq/L (ref 3.5–5.3)
Sodium: 140 mEq/L (ref 135–145)

## 2009-01-07 LAB — CBC WITH DIFFERENTIAL/PLATELET
Eosinophils Absolute: 0.2 10*3/uL (ref 0.0–0.5)
MONO#: 0.5 10*3/uL (ref 0.1–0.9)
NEUT#: 3.3 10*3/uL (ref 1.5–6.5)
Platelets: 114 10*3/uL — ABNORMAL LOW (ref 140–400)
RBC: 3.42 10*6/uL — ABNORMAL LOW (ref 4.20–5.82)
RDW: 14.3 % (ref 11.0–14.6)
WBC: 5.4 10*3/uL (ref 4.0–10.3)
lymph#: 1.5 10*3/uL (ref 0.9–3.3)

## 2009-04-05 ENCOUNTER — Ambulatory Visit: Payer: Self-pay | Admitting: Oncology

## 2009-04-09 LAB — BASIC METABOLIC PANEL
BUN: 31 mg/dL — ABNORMAL HIGH (ref 6–23)
Creatinine, Ser: 1.38 mg/dL (ref 0.40–1.50)
Potassium: 4.2 mEq/L (ref 3.5–5.3)

## 2009-04-09 LAB — CBC WITH DIFFERENTIAL/PLATELET
Basophils Absolute: 0 10*3/uL (ref 0.0–0.1)
EOS%: 4.4 % (ref 0.0–7.0)
HGB: 11.7 g/dL — ABNORMAL LOW (ref 13.0–17.1)
MCH: 32.2 pg (ref 27.2–33.4)
MCV: 93.9 fL (ref 79.3–98.0)
MONO%: 9.7 % (ref 0.0–14.0)
RDW: 15.3 % — ABNORMAL HIGH (ref 11.0–14.6)

## 2009-08-07 ENCOUNTER — Ambulatory Visit: Payer: Self-pay | Admitting: Oncology

## 2009-08-09 LAB — CBC WITH DIFFERENTIAL/PLATELET
BASO%: 0.4 % (ref 0.0–2.0)
Eosinophils Absolute: 0.3 10*3/uL (ref 0.0–0.5)
HCT: 34.1 % — ABNORMAL LOW (ref 38.4–49.9)
MCHC: 33.4 g/dL (ref 32.0–36.0)
MONO#: 0.6 10*3/uL (ref 0.1–0.9)
NEUT#: 4.2 10*3/uL (ref 1.5–6.5)
RBC: 3.62 10*6/uL — ABNORMAL LOW (ref 4.20–5.82)
WBC: 7.1 10*3/uL (ref 4.0–10.3)
lymph#: 1.9 10*3/uL (ref 0.9–3.3)

## 2009-08-09 LAB — BASIC METABOLIC PANEL
CO2: 22 mEq/L (ref 19–32)
Chloride: 104 mEq/L (ref 96–112)
Sodium: 140 mEq/L (ref 135–145)

## 2009-12-04 ENCOUNTER — Ambulatory Visit: Payer: Self-pay | Admitting: Oncology

## 2009-12-05 LAB — CBC WITH DIFFERENTIAL/PLATELET
Eosinophils Absolute: 0.3 10*3/uL (ref 0.0–0.5)
LYMPH%: 26.3 % (ref 14.0–49.0)
MCHC: 34.6 g/dL (ref 32.0–36.0)
MCV: 93.2 fL (ref 79.3–98.0)
MONO%: 9.3 % (ref 0.0–14.0)
NEUT#: 3.1 10*3/uL (ref 1.5–6.5)
Platelets: 115 10*3/uL — ABNORMAL LOW (ref 140–400)
RBC: 3.34 10*6/uL — ABNORMAL LOW (ref 4.20–5.82)

## 2010-04-02 ENCOUNTER — Ambulatory Visit: Payer: Self-pay | Admitting: Oncology

## 2010-04-07 LAB — CBC WITH DIFFERENTIAL/PLATELET
Basophils Absolute: 0 10*3/uL (ref 0.0–0.1)
Eosinophils Absolute: 0.2 10*3/uL (ref 0.0–0.5)
HCT: 33.1 % — ABNORMAL LOW (ref 38.4–49.9)
HGB: 11.4 g/dL — ABNORMAL LOW (ref 13.0–17.1)
LYMPH%: 23.2 % (ref 14.0–49.0)
MCV: 91.8 fL (ref 79.3–98.0)
MONO%: 9.2 % (ref 0.0–14.0)
NEUT#: 3.5 10*3/uL (ref 1.5–6.5)
NEUT%: 63.6 % (ref 39.0–75.0)
Platelets: 131 10*3/uL — ABNORMAL LOW (ref 140–400)

## 2010-06-17 ENCOUNTER — Ambulatory Visit: Payer: Self-pay | Admitting: Cardiology

## 2010-06-27 ENCOUNTER — Ambulatory Visit (HOSPITAL_COMMUNITY)
Admission: RE | Admit: 2010-06-27 | Discharge: 2010-06-27 | Payer: Self-pay | Source: Home / Self Care | Admitting: Geriatric Medicine

## 2010-07-03 ENCOUNTER — Inpatient Hospital Stay (HOSPITAL_COMMUNITY): Admission: EM | Admit: 2010-07-03 | Discharge: 2010-06-20 | Payer: Self-pay | Admitting: Emergency Medicine

## 2010-07-11 ENCOUNTER — Ambulatory Visit: Payer: Self-pay | Admitting: Oncology

## 2010-08-15 ENCOUNTER — Other Ambulatory Visit: Payer: Self-pay | Admitting: Oncology

## 2010-08-15 DIAGNOSIS — J984 Other disorders of lung: Secondary | ICD-10-CM

## 2010-08-16 ENCOUNTER — Encounter: Payer: Self-pay | Admitting: Urology

## 2010-09-08 ENCOUNTER — Encounter (HOSPITAL_BASED_OUTPATIENT_CLINIC_OR_DEPARTMENT_OTHER): Payer: Medicare Other | Admitting: Oncology

## 2010-09-08 ENCOUNTER — Ambulatory Visit (HOSPITAL_COMMUNITY)
Admission: RE | Admit: 2010-09-08 | Discharge: 2010-09-08 | Disposition: A | Discharge status: Home / Self Care | Payer: Medicare Other | Source: Ambulatory Visit | Attending: Oncology | Admitting: Oncology

## 2010-09-08 DIAGNOSIS — R05 Cough: Secondary | ICD-10-CM | POA: Insufficient documentation

## 2010-09-08 DIAGNOSIS — Z8546 Personal history of malignant neoplasm of prostate: Secondary | ICD-10-CM | POA: Insufficient documentation

## 2010-09-08 DIAGNOSIS — C8299 Follicular lymphoma, unspecified, extranodal and solid organ sites: Secondary | ICD-10-CM

## 2010-09-08 DIAGNOSIS — C61 Malignant neoplasm of prostate: Secondary | ICD-10-CM

## 2010-09-08 DIAGNOSIS — D696 Thrombocytopenia, unspecified: Secondary | ICD-10-CM

## 2010-09-08 DIAGNOSIS — Z87898 Personal history of other specified conditions: Secondary | ICD-10-CM | POA: Insufficient documentation

## 2010-09-08 DIAGNOSIS — R059 Cough, unspecified: Secondary | ICD-10-CM | POA: Insufficient documentation

## 2010-09-08 DIAGNOSIS — R0602 Shortness of breath: Secondary | ICD-10-CM | POA: Insufficient documentation

## 2010-09-08 DIAGNOSIS — J984 Other disorders of lung: Secondary | ICD-10-CM

## 2010-09-08 DIAGNOSIS — Z950 Presence of cardiac pacemaker: Secondary | ICD-10-CM | POA: Insufficient documentation

## 2010-10-06 LAB — GLUCOSE, CAPILLARY: Glucose-Capillary: 92 mg/dL (ref 70–99)

## 2010-10-07 LAB — CBC
HCT: 31.7 % — ABNORMAL LOW (ref 39.0–52.0)
HCT: 33.6 % — ABNORMAL LOW (ref 39.0–52.0)
Hemoglobin: 10.5 g/dL — ABNORMAL LOW (ref 13.0–17.0)
Hemoglobin: 11.1 g/dL — ABNORMAL LOW (ref 13.0–17.0)
MCH: 30.7 pg (ref 26.0–34.0)
MCH: 30.7 pg (ref 26.0–34.0)
MCHC: 33.1 g/dL (ref 30.0–36.0)
MCV: 92.1 fL (ref 78.0–100.0)
MCV: 92.7 fL (ref 78.0–100.0)
MCV: 92.8 fL (ref 78.0–100.0)
Platelets: 133 10*3/uL — ABNORMAL LOW (ref 150–400)
RBC: 3.42 MIL/uL — ABNORMAL LOW (ref 4.22–5.81)
RDW: 14.5 % (ref 11.5–15.5)
WBC: 6.8 10*3/uL (ref 4.0–10.5)
WBC: 7 10*3/uL (ref 4.0–10.5)

## 2010-10-07 LAB — DIFFERENTIAL
Basophils Absolute: 0 10*3/uL (ref 0.0–0.1)
Basophils Relative: 0 % (ref 0–1)
Eosinophils Absolute: 0.9 10*3/uL — ABNORMAL HIGH (ref 0.0–0.7)
Eosinophils Relative: 13 % — ABNORMAL HIGH (ref 0–5)
Lymphocytes Relative: 32 % (ref 12–46)

## 2010-10-07 LAB — CARDIAC PANEL(CRET KIN+CKTOT+MB+TROPI)
CK, MB: 4.3 ng/mL — ABNORMAL HIGH (ref 0.3–4.0)
CK, MB: 6.8 ng/mL (ref 0.3–4.0)
Relative Index: 3.5 — ABNORMAL HIGH (ref 0.0–2.5)
Relative Index: INVALID (ref 0.0–2.5)
Total CK: 192 U/L (ref 7–232)
Total CK: 97 U/L (ref 7–232)

## 2010-10-07 LAB — BASIC METABOLIC PANEL WITH GFR
BUN: 19 mg/dL (ref 6–23)
BUN: 28 mg/dL — ABNORMAL HIGH (ref 6–23)
CO2: 21 meq/L (ref 19–32)
CO2: 24 meq/L (ref 19–32)
Calcium: 9.2 mg/dL (ref 8.4–10.5)
Calcium: 9.7 mg/dL (ref 8.4–10.5)
Chloride: 107 meq/L (ref 96–112)
Chloride: 107 meq/L (ref 96–112)
Creatinine, Ser: 1.1 mg/dL (ref 0.4–1.5)
Creatinine, Ser: 1.22 mg/dL (ref 0.4–1.5)
GFR calc non Af Amer: 57 mL/min — ABNORMAL LOW
GFR calc non Af Amer: >60 mL/min
Glucose, Bld: 115 mg/dL — ABNORMAL HIGH (ref 70–99)
Glucose, Bld: 125 mg/dL — ABNORMAL HIGH (ref 70–99)
Potassium: 4.1 meq/L (ref 3.5–5.1)
Potassium: 4.4 meq/L (ref 3.5–5.1)
Sodium: 136 meq/L (ref 135–145)
Sodium: 137 meq/L (ref 135–145)

## 2010-10-07 LAB — POCT CARDIAC MARKERS
CKMB, poc: 3.8 ng/mL (ref 1.0–8.0)
Myoglobin, poc: 175 ng/mL (ref 12–200)
Troponin i, poc: <0.05 ng/mL (ref 0.00–0.09)

## 2010-10-07 LAB — CK TOTAL AND CKMB (NOT AT ARMC)
CK, MB: 7.6 ng/mL (ref 0.3–4.0)
Total CK: 231 U/L (ref 7–232)

## 2010-10-07 LAB — COMPREHENSIVE METABOLIC PANEL
ALT: 17 U/L (ref 0–53)
AST: 23 U/L (ref 0–37)
Calcium: 9.5 mg/dL (ref 8.4–10.5)
Creatinine, Ser: 1.21 mg/dL (ref 0.4–1.5)
GFR calc Af Amer: >60 mL/min (ref >60)
Sodium: 136 mEq/L (ref 135–145)
Total Protein: 6.2 g/dL (ref 6.0–8.3)

## 2010-10-07 LAB — D-DIMER, QUANTITATIVE

## 2010-10-07 LAB — LIPID PANEL
Cholesterol: 123 mg/dL (ref 0–200)
Total CHOL/HDL Ratio: 3.1 RATIO

## 2010-10-07 LAB — TROPONIN I

## 2010-10-07 LAB — TSH: TSH: 2.283 u[IU]/mL (ref 0.350–4.500)

## 2010-10-07 LAB — HEPARIN LEVEL (UNFRACTIONATED): Heparin Unfractionated: 0.46 IU/mL (ref 0.30–0.70)

## 2010-10-07 LAB — MRSA PCR SCREENING
MRSA by PCR: NEGATIVE
MRSA by PCR: NEGATIVE

## 2010-10-27 ENCOUNTER — Other Ambulatory Visit (HOSPITAL_COMMUNITY): Payer: Self-pay | Admitting: Urology

## 2010-10-27 DIAGNOSIS — C61 Malignant neoplasm of prostate: Secondary | ICD-10-CM

## 2010-11-06 ENCOUNTER — Encounter (HOSPITAL_COMMUNITY): Payer: Self-pay

## 2010-11-06 ENCOUNTER — Ambulatory Visit (HOSPITAL_COMMUNITY)
Admission: RE | Admit: 2010-11-06 | Discharge: 2010-11-06 | Disposition: A | Discharge status: Home / Self Care | Payer: Medicare Other | Source: Ambulatory Visit | Attending: Urology | Admitting: Urology

## 2010-11-06 ENCOUNTER — Encounter (HOSPITAL_COMMUNITY)
Admission: RE | Admit: 2010-11-06 | Discharge: 2010-11-06 | Disposition: A | Discharge status: Home / Self Care | Payer: Medicare Other | Source: Ambulatory Visit | Attending: Urology | Admitting: Urology

## 2010-11-06 DIAGNOSIS — C61 Malignant neoplasm of prostate: Secondary | ICD-10-CM | POA: Insufficient documentation

## 2010-11-06 DIAGNOSIS — M545 Low back pain, unspecified: Secondary | ICD-10-CM | POA: Insufficient documentation

## 2010-11-06 HISTORY — DX: Malignant neoplasm of prostate: C61

## 2010-11-06 MED ORDER — TECHNETIUM TC 99M MEDRONATE IV KIT
23.7000 | PACK | Freq: Once | INTRAVENOUS | Status: AC | PRN
  Administered 2010-11-06: 24 via INTRAVENOUS

## 2010-12-09 ENCOUNTER — Encounter (HOSPITAL_BASED_OUTPATIENT_CLINIC_OR_DEPARTMENT_OTHER): Payer: Medicare Other | Admitting: Oncology

## 2010-12-09 ENCOUNTER — Other Ambulatory Visit: Payer: Self-pay | Admitting: Oncology

## 2010-12-09 DIAGNOSIS — J984 Other disorders of lung: Secondary | ICD-10-CM

## 2010-12-09 DIAGNOSIS — D696 Thrombocytopenia, unspecified: Secondary | ICD-10-CM

## 2010-12-09 DIAGNOSIS — C61 Malignant neoplasm of prostate: Secondary | ICD-10-CM

## 2010-12-09 DIAGNOSIS — T451X5A Adverse effect of antineoplastic and immunosuppressive drugs, initial encounter: Secondary | ICD-10-CM

## 2010-12-09 DIAGNOSIS — C8299 Follicular lymphoma, unspecified, extranodal and solid organ sites: Secondary | ICD-10-CM

## 2010-12-09 LAB — CBC WITH DIFFERENTIAL/PLATELET
BASO%: 0.2 % (ref 0.0–2.0)
EOS%: 4 % (ref 0.0–7.0)
HCT: 32.2 % — ABNORMAL LOW (ref 38.4–49.9)
LYMPH%: 22.1 % (ref 14.0–49.0)
MCH: 31.8 pg (ref 27.2–33.4)
MCHC: 34.3 g/dL (ref 32.0–36.0)
MCV: 92.6 fL (ref 79.3–98.0)
MONO#: 0.5 10*3/uL (ref 0.1–0.9)
MONO%: 6.9 % (ref 0.0–14.0)
NEUT%: 66.8 % (ref 39.0–75.0)
Platelets: 131 10*3/uL — ABNORMAL LOW (ref 140–400)

## 2010-12-09 NOTE — Cardiovascular Report (Signed)
NAME:  Edward Woodward, Edward Woodward NO.:  [id number]   MEDICAL RECORD NO.:  [id number]         PATIENT TYPE:  CINP   LOCATION:                                 FACILITY:   PHYSICIAN:  Francisca December, M.D.  DATE OF BIRTH:  [birth year]   DATE OF PROCEDURE:  DATE OF DISCHARGE:                            CARDIAC CATHETERIZATION   PROCEDURES PERFORMED:  1. Left heart catheterization.  2. Left ventriculogram.  3. Coronary angiography.  4. Saphenous vein graft angiography.  5. Intravascular ultrasound.  6. PCI/drug-eluting stent implantation, proximal saphenous vein graft      to diagonal.   INDICATIONS:  [name] is a pleasant 77 year old man with long and  extensive history of atherosclerotic cardiovascular disease status post  CABG in 1985.  At that time, he had a saphenous vein graft placed to a  diagonal and sequentially to OM1 and OM2.  The LAD has subsequently  completely occluded and the saphenous vein graft to the diagonal  supplies retrograde filling to the LAD.   He has had multiple previous interventions to include the following;  bypass surgery as noted above in 1985.  He had a stent placed in the  right coronary in October 2001 and then a stent placed in the protected  left main extending into the left circumflex also in October 2001.  In  April 2007, he had a stent which was a bare metal device placed in the  body/midportion of the SVG to the diagonal and LAD.  In April 2008, he  had a bare metal stent placed in the proximal portion of the body of the  saphenous vein graft to the LAD diagonal.   He has represented at this time with progressive angina and a non-ST  elevated myocardial infarction by minimally elevated CK-MB and troponin.  He is brought to catheterization laboratory at this time to identify the  extent disease and provide further therapeutic options.   PROCEDURE NOTE:  Please see the previously completed electronically  generated procedure  note.  This is an addendum to that report to further  clarify the patient's history and report on intravascular ultrasound.  The diagnostic study is described in that report and demonstrated wide  patency of the stent in the midbody of the saphenous vein graft to the  diagonal, a widely patent saphenous vein graft to the obtuse marginals  sequentially, a completely occluded LAD, which is not new, diffuse  disease in the left circumflex with complete occlusion of the proximal  portion of OM1 and OM2, and finally a severe and progressive disease in  the right coronary with a 99% stenosis in the proximal portion and an  80% stenosis in the midportion.  The previously placed stent in the  right coronary has approximately 30-50% diffuse in-stent restenosis.   After completion of the diagnostic study, which was challenging due to  tortuosity in the pelvic arteries and also in the thoracic aorta, I  elected to proceed with intravascular ultrasound of the saphenous vein  graft, previously placed stent in the proximal portion, which appeared  to be 50-70% restenotic.  It should be noted that a 23-cm long 6-French stent was placed in the  right femoral artery to facilitate manipulation of the catheters.  The  left main was finally cannulated in a JR-5 device.  The right coronary  was cannulated with a standard right Judkins #4 and the saphenous vein  grafts were also successfully cannulated with right Judkins catheters.  A LCB guiding catheter though was required to cannulate the saphenous  vein graft to the diagonal for intravascular ultrasound.  The patient  did receive 40 units per kg of heparin prior to initiation of the  ultrasound.  This resulted in ACT of 260 seconds.  The Atlantis  intravascular ultrasound device was used.  A single pullback was  recorded.   Intravascular ultrasound results demonstrated a distal reference vessel  in the 4.5 x 4.25 range.  The smallest luminal diameter  within the  severely restenotic segment was 2 mm x 2.5 mm.   Following balloon dilatation and stent implantation as described in the  electronic report, there was a luminal diameter of 3.5 x 3.0 mm within  the most tightly stenotic portion of the stented segment after drug-  eluting stent implantation and high pressure balloon dilatation at 18-20  atmospheres using a 5.0 mm in diameter and 8 mm in length device.  There  was good stent strut apposition throughout the stented segment,   As mentioned, remainder of the findings of the results of the study are  reported elsewhere.  This was an addendum to that report.  The patient  tolerated the procedure well.  A total of 260 mL of contrast were  utilized.  He was transported to the recovery area in stable condition  after removal of the sheath and placement of the Angio-Seal femoral  arteriotomy occluding device.      Francisca December, M.D.  Electronically Signed     JHE/MEDQ  D:  01/27/2008  T:  01/27/2008  Job:  [id number]

## 2010-12-09 NOTE — Op Note (Signed)
NAMEDINK, CREPS              ACCOUNT NO.:  [id number]   MEDICAL RECORD NO.:  [id number]          PATIENT TYPE:  OIB   LOCATION:  2899                         FACILITY:  [facility]   PHYSICIAN:  Francisca December, M.D.  DATE OF BIRTH:  1926/09/21   DATE OF PROCEDURE:  05/09/2007  DATE OF DISCHARGE:  05/09/2007                               OPERATIVE REPORT   PROCEDURE PERFORMED:  1. Explant old pacing generator.  2. Implant new dual-chamber pacing generator.   INDICATIONS:  The implanted device is a Guidant pulse R-1 model number  1270, serial number [id number] implanted on Nov 25, 1999 now at elective  replacement interval.   PROCEDURAL NOTE:  The patient is brought to cardiac catheterization  laboratory in THE fasting state.  The left prepectoral region was  prepped and draped in the usual sterile fashion.  Local anesthesia was  obtained with the infiltration of 1% lidocaine with epinephrine over the  previous pacemaker insertion site.   A 6-7 cm incision was then made over the old incision, and this was  carried down by sharp dissection sharp-and-blunt dissection to the  prepacemaker capsule.  The capsule was incised and the pacemaker  delivered without extensive difficulty.  The leads were then detached  from the pacing generator, and each was tested for adequate pacing  parameters.  This is reported below.  The pocket was then copiously  irrigated using 1% kanamycin solution.   The leads were then attached to the attached to the new pacing generator  carefully identifying each by its serial number, and placing each into  the appropriate receptacle.  Each lead was tightened into place and  tested for security.  The device was then placed into the previous  pocket and an #O silk anchoring suture was applied to the pectoralis  muscle.  The wound was then closed using 2-0 Vicryl in a running fashion  for the subcutaneous layer.  The skin was approximated using 4-0 Vicryl  in a  running subcuticular fashion.  Steri-Strips and a sterile dressing  were applied, and the patient was transported to the recovery area in  stable condition.   PACING DATA:  The ventricular lead detected a 13.6 mV R wave.  The  pacing threshold was 1.1 volts at 0.5 milliseconds pulse width.  The  impedance was 430 ohms, resulting in a current at capture threshold of  1.6 MA.  The atrial lead detected a 2.1 mV P wave.  The pacing threshold  was 0.6 volts at 0.5 milliseconds pulse width.  The impedance was 474  ohms resulting in a current at capture threshold of 1.1 MA.   The implanted pacing generator is a [employer] 1+ DR  model number [id number], serial number [id number].      Francisca December, M.D.  Electronically Signed     JHE/MEDQ  D:  05/09/2007  T:  05/10/2007  Job:  [id number]

## 2010-12-09 NOTE — Discharge Summary (Signed)
NAMEVEARL, ALLBAUGH              ACCOUNT NO.:  [id number]   MEDICAL RECORD NO.:  [id number]          PATIENT TYPE:  INP   LOCATION:  2630                         FACILITY:  [facility]   PHYSICIAN:  Francisca December, M.D.  DATE OF BIRTH:  1926/08/22   DATE OF ADMISSION:  01/24/2008  DATE OF DISCHARGE:  01/27/2008                               DISCHARGE SUMMARY   DISCHARGE DIAGNOSES:  1. Coronary artery disease status post drug-eluting stent through the      saphenous vein graft to the diagonal, drug-eluting stent to the      right coronary artery.  2. Peripheral vascular disease status post carotid endarterectomy in      the past.  3. History of hypertension.  4. Gastroesophageal reflux disease.  5. Dyslipidemia.  6. Metastatic prostrate cancer.  7. Known coronary artery disease.  8. History of coronary artery bypass grafting in 1985.  9. History of pacemaker insertion in the past.   HOSPITAL COURSE:  Mr. Hlavaty is a 77 year old male patient with known  coronary artery disease who was having increasing angina symptoms over  the past month.  He states he is having left chest pressure that  radiates into the back and sometimes into the arm associated with  shortness of breath and besides probably with activity, but it has also  has been happening at rest.   The patient was admitted to the hospital.  He underwent cardiac  catheterization and was found to have saphenous vein graft to the  diagonal with 70% stenosis.  A drug-eluting stent was implanted.  He was  defined with a right coronary artery disease and this was done as a  staged procedure on January 26, 2008.  This vessel was implanted with a drug-  eluting stent.  On January 27, 2008, he was discharged home in stable but  improved condition.   The patient was followed by Dr. Truett Perna and the Oncology team because  of leukocytopenia/neutropenia, but this was secondary to  rituximab  given in March of 2009, and it was felt that this  should spontaneously  resolve over the next several months.   LABORATORY STUDIES:  Hemoglobin 10.3, hematocrit 29.6, white count 3.5,  platelets 107, sodium 129, potassium 4.4, BUN 12, creatinine 1.16,  maximum troponin 0.15, maximum CK 387 with an MB fraction of 10.7.   DISCHARGE MEDICATIONS:  1. Lisinopril 20 mg a day.  2. Imdur 30 mg a day.  3. Simvastatin 20 mg a day.  4. Norvasc 5 mg a day.  5. Metoprolol ER 100 mg a day.  6. Citalopram 20 mg a day.  7. Lasix 40 mg a day.  8. Fish oil 1 g twice a day.  9. Sublingual nitroglycerine p.r.n. chest pain.  10.Plavix 75 mg a day.  11.Enteric-coated aspirin 325 mg a day.   Remain on a low-sodium, heart-healthy diet.  Clean cath site gently with  soap and water.  No scrubbing.  No driving for two days.  No lifting  over 10 pounds for 1 week.  Follow up with Dr. Amil Amen in 2 weeks.  Office will  call for an appointment.      Guy Franco, P.A.      Francisca December, M.D.  Electronically Signed    LB/MEDQ  D:  01/27/2008  T:  01/27/2008  Job:  [id number]   cc:   Leighton Roach. Truett Perna, M.D.

## 2010-12-09 NOTE — H&P (Signed)
NAME:  Edward Woodward, Edward Woodward NO.:  [id number]   MEDICAL RECORD NO.:  [id number]           PATIENT TYPE:   LOCATION:                                 FACILITY:   PHYSICIAN:  Francisca December, M.D.  DATE OF BIRTH:  [birth year]   DATE OF ADMISSION:  DATE OF DISCHARGE:                              HISTORY & PHYSICAL   HISTORY AND PHYSICAL EXAMINATION   PHYSICIAN:  Francisca December, MD   CHIEF COMPLAINT:  Chest pain.   Mr. Darling is a pleasant 77 year old gentleman with known CAD status  post CABG in 1985 with multiple interventions since then.  He states  that over the last month he has had daily chest discomfort consistent  with the angina he has had in the past.  He describes it is left chest  pressure that radiates to the back and some time into the arm associated  with shortness of breath, but no sweats or nausea.  Episodes occur with  activity, but he has also noticed some rest.  Symptoms are relieved  typically with 2 nitroglycerin sprays.  Sometimes if he stops and rest  the discomfort will go away on its own.  Episodes in the last couple of  weeks have been occurring frequently throughout the day and he has had  to use more and more nitroglycerin.  This is compared with no angina  symptoms at all a few months ago.  Due to his escalating symptoms, he is  being admitted for unstable angina and possible cardiac catheterization  in the morning.   REVIEW OF SYSTEMS:  GENERAL:  Has been more fatigued in the last month.  No fever, chills, or weight loss or gain.  HEENT:  No vision problems.  He does have some hearing loss.  No epistaxis, sinus congestion, and  sore throat.  NECK:  No unusual stiffness.  CHEST:  Shortness of breath  with exertion as above.  Denies PND and orthopnea.  Denies any recent  cough or wheezing.  CARDIOVASCULAR:  As above.  No palpitations,  tachyarrhythmias, or pleuritic chest discomfort.  ABDOMEN:  No nausea,  vomiting, abdominal pain,  problems with stools, hematochezia, or melena.  GU:  History of metastatic prostate cancer with recent increase in PSA.  Denies any urinary tract infection symptomatology.  EXTREMITIES:  No  unusual joint stiffness or pain.  No peripheral edema.  NEURO:  No  numbness, tingling, headaches, seizures, memory loss, dizziness,  lightheadedness, or syncope.   PAST MEDICAL HISTORY:  1. ASCVD, three-vessel status post CABG in 1985.      a.     Status post PCI bare metal stent implantation, SVG to       diagonal on November 23, 2005.      b.     Status post recent PCI/BM stent implantation, SVG to       diagonal on October 22, 2006.      c.     Basal inferolateral, mid inferolateral, mid inferior and       basal inferior ischemia,  GSPECT adenosine low level walk, 12/  2007, LVEF 60%, question RCA or LCx.      d.     Known occluded PDA with collaterals from LAD, cath 10/2005      e.     Stress Cardiolite, August 30, 2007, mod inferior perfusion       defect due to infarct/scar with moderate peri-infarct ischemia       basal inferior, mid inferior, and basal inferolateral regions, no       change from prior stress test.  2. ASPVD, status post carotid endarterectomy.  3. Hypertension.  4. Cholecystitis.  5. Spinal stenosis, status post surgical intervention.  6. Peripheral neuropathy status post extensive workup Rowan Blase.  7. Dyslipidemia  8. GERD.  9. Sleep apnea.  10.Sick sinus syndrome, status post permanent pacemaker, Nov 25, 1999,      battery change October 2008.  11.Metastatic prostate carcinoma on Lupron.  12.Follicular lymphoma status post recent biopsy on neck lymph node.  13.Gallstones on abdominal ultrasound, January 2009.   PAST SURGICAL HISTORY:  1. Status post CABG, 1985.      a.     Multiple revascularizations as above.  2. Status post carotid endarterectomy  3. Surgical intervention for spinal stenosis.  4. Status post PTVT, Nov 25, 1999, for sick sinus syndrome with  battery      change in October 2008.   MEDICATIONS:  1. Lisinopril 20 mg a day.  2. Isosorbide 30 mg daily.  3. Simvastatin 20 mg daily.  4. Amlodipine 5 mg a day.  5. Metoprolol ER 100 mg daily.  6. Citalopram 20 mg daily.  7. Furosemide 40 mg daily.  8. Aspirin 325 mg daily.  9. Multivitamin daily.  10.Fish oil 1 g b.i.d.  11.Calcium and vitamin D daily.   ALLERGIES:  PENICILLIN causes swelling, also allergic to VYTORIN and  TUSSIONEX.   SOCIAL HISTORY:  Married.  Retired.  Has a 20-pack-year history of  smoking, discontinued 25+ years ago.  No history of alcohol use.   FAMILY HISTORY:  Mother died of an unknown cancer.  Father died of CAD.  He has one brother living with CAD and one brother deceased due to  coronary artery disease.   OBJECTIVE:  Weight is 218, pulse 76, blood pressure 130/80.  Pleasant gentleman in no acute distress.  SKIN:  Warm and dry.  HEENT:  Unremarkable.  NECK:  No JVD.  No bruit auscultated.  No adenopathy.  Thyroid gland is  not enlarged.  CHEST:  Good excursion without rales, wheezing, or rhonchi.  HEART:  Regular rate and rhythm, normal S1 and S2 without murmur.  ABDOMEN:  Soft and nondistended.  Bowel sounds present.  No abdominal  tenderness or bruits.  No hepatosplenomegaly or masses palpated.  GU/RECTAL:  Deferred.  EXTREMITIES:  MAU x4.  Peripheral pulses +2.  No clubbing, cyanosis, or  edema.  NEURO:  Nonfocal.   EKG reveals atrial pacing, incomplete right bundle branch block, heart  rate at 65 beats per minute, and no ST-segment changes.   IMPRESSION:  1. Unstable and progressive angina in a patient with known coronary      artery disease, status post coronary artery bypass graft and      multiple interventions.  2. Hypertension.  3. Metastatic prostate cancer.  4. Hyperlipidemia.  5. Sleep apnea.  6. Peripheral neuropathy.  7. Status post pacemaker insertion.   PLAN:  Admit to monitored bed.  Check cardiac markers.  Begin  Lovenox  subcu.  N.p.o. after midnight for  possible catheterization in the a.m.  add IV NTG, bedrest  Counseled regarding goals, risks, alternatives of cardiac cath/possible  PCI.  Pt states his understanding, has had questions answered, and  wishes to proceed.      Tamera C. Lianne Cure      Francisca December, M.D.  Electronically Signed    TCL/MEDQ  D:  01/24/2008  T:  01/25/2008  Job:  [id number]

## 2010-12-12 NOTE — Consult Note (Signed)
[name]  COURTNEY, FENLON NO.:  [id number]   MEDICAL RECORD NO.:  [id number]          PATIENT TYPE:  INP   LOCATION:  2022                         FACILITY:  [facility]   PHYSICIAN:  Kerin Perna, M.D.  DATE OF BIRTH:  03/09/1927   DATE OF CONSULTATION:  10/20/2006  DATE OF DISCHARGE:                                 CONSULTATION   REFERRING PHYSICIAN:  Lyn Records, M.D.   PRIMARY CARDIOLOGIST:  Francisca December, M.D.   PRIMARY CARE PHYSICIAN:  Hal T. Pete Glatter, M.D.   ONCOLOGIST:  Leighton Roach. Truett Perna, M.D.   REASON FOR CONSULTATION:  Severe recurrent coronary artery disease with  Class I stable angina and subendocardial MI.   CHIEF COMPLAINT:  Chest pain.   HISTORY OF PRESENT ILLNESS:  I was asked to evaluate this 77 year old  white male nondiabetic for potential redo coronary revascularization for  recently diagnosed severe recurrent coronary disease.  The patient had  CABG x4 in 1985 at [place], [place] with sequential saphenous vein  graft to the diagonal/LAD and a sequential saphenous vein graft to the  OM1 and OM2.  Since that time, he has had multiple percutaneous  interventions and stents to his native vessels and the saphenous vein  graft.  His most recent stent was late in 2007 when a stent to the  diagonal/LAD vein graft was placed by Dr. Amil Amen.  He was admitted on  October 18, 2005 with an episode of exertional chest pain which progressed  to resting chest pain and he presented to the emergency department where  his cardiac enzymes were mildly elevated.  He was treated with Lovenox  and was given Plavix load this morning and a diagnostic cath was  performed.  This showed a new stenosis, high-grade 90% in the proximal  vein graft to the LAD.  The vein graft to the OM1 and OM2 is patent and  the distal vein graft to the diagonal/LAD is patent.  His EF remains  normal and he has moderate disease in the native right.   PAST MEDICAL HISTORY:  1.  Metastatic carcinoma of the prostate on Lupron therapy with a low      PSA currently.  2. Status post right carotid endarterectomy.  3. Neuropathy-spinal stenosis, status post lumbar laminectomy and      fusion.  4. Hypertension.  5. Sleep apnea.  6. Allergy to penicillin.  7. Chronic anemia.  8. Subclinical lymph node by retroperitoneal lymph node biopsy, not      requiring chemotherapy.   CURRENT MEDICATIONS:  1. Norvasc 5 mg.  2. Lasix 40 mg.  3. Aspirin 325 mg.  4. Toprol XL 100 mg.  5. Melatonin 3 mg.  6. Zetia 10 mg.  7. Simvastatin 20 mg.  8. Fosinopril 20 mg.  9. Imdur 60 mg.  10.Lupron 6.5 mg implant.  11.Nitroglycerin spray p.r.n.  12.He is also on citalopram 20 mg daily.   ALLERGIES:  PENICILLIN causes rash and swelling.   SOCIAL HISTORY:  He is married and has an adult daughter who is a Engineer, civil (consulting)  at [employer].  He does not smoke or use alcohol.   FAMILY HISTORY:  Positive for coronary artery disease, cancer and  prostate cancer.   REVIEW OF SYSTEMS:  No major weight loss.  No upper respiratory  infection this winter.  He has a total upper dental plate and a partial  lower plate.  He goes to the gym 2-3 times a week, works on the  treadmill and lifts weight.  He has some difficulty walking due to his  neuropathy but does not use a walker.  He states he had no specific  difficulty with the bypass surgery in 1985 regarding hemorrhage or wound  infection.  Vein was harvested from the left thigh and no vein was  removed from the right leg.  His right carotid endarterectomy performed  by Dr. Arbie Cookey went uneventfully and he is followed by Dr. Darvin Neighbours for  his prostate cancer.  He denies stroke, seizure, diabetes or thyroid  disease.   PHYSICAL EXAMINATION:  The patient is 5 foot 11.  Weighs 218 pounds.  Blood pressure is 110/60, pulse 70, respirations 18.  GENERAL APPEARANCE:  That of an elderly, somewhat chronically ill,  debilitated appearing male in  no distress after cardiac catheterization.  HEENT:  Exam is normocephalic.  Dentition good.  Neck is without JVD,  mass or carotid bruit.  LYMPHATIC:  No palpable cervical or supraclavicular adenopathy.  Breath  sounds are clear.  He has a well-healed sternal incision.  CARDIAC:  Exam is regular without S3, gallop or murmur.  ABDOMINAL:  Exam is soft.  He has an old paramedian surgical incision  from an exploratory laparotomy in 1958 for partial colectomy due to  bleeding problems.  EXTREMITIES:  No clubbing, cyanosis, or edema.  Peripheral pulses are 1+  in the extremities and he is right-hand dominant.  NEUROLOGIC EXAM:  Alert and oriented without focal motor deficit.  His  gait is not assessed because he is at bedrest following cardiac  catheterization.   LABORATORY DATA:  I reviewed the coronary arteriogram and read Dr.  Michaelle Copas interpretation of severe recurrent coronary disease with a high-  grade 90% stenosis of the proximal saphenous vein graft to the  diagonal/LAD sequential graft.  Overall, LV function is preserved.   IMPRESSION/RECOMMENDATIONS:  The patient needs revascularization of his  left anterior descending artery graft.  Percutaneous intervention would  probably be the best direct approach.  The patient would, however, be a  potential candidate for redo bypass surgery using mammary artery and new  vein grafts but at increased risks.  At this point, the patient is  inclined to proceed with an additional percutaneous intervention.  I  would be happy to discuss redo definitive surgery with the patient in  the future if the need arises.      Kerin Perna, M.D.  Electronically Signed     PV/MEDQ  D:  10/20/2006  T:  10/21/2006  Job:  [id number]

## 2011-04-23 LAB — CBC
HCT: 30.8 — ABNORMAL LOW
HCT: 32.5 — ABNORMAL LOW
Hemoglobin: 10.6 — ABNORMAL LOW
Hemoglobin: 10.7 — ABNORMAL LOW
Hemoglobin: 11.3 — ABNORMAL LOW
MCHC: 34.3
MCHC: 34.5
MCHC: 34.6
MCHC: 34.8
MCHC: 35.2
MCV: 89.6
Platelets: 139 — ABNORMAL LOW
Platelets: 107 — ABNORMAL LOW
RBC: 3.6 — ABNORMAL LOW
RBC: 3.26 — ABNORMAL LOW
RDW: 14.7
RDW: 14.9
RDW: 15
RDW: 15.1
WBC: 1.9 — ABNORMAL LOW

## 2011-04-23 LAB — CK TOTAL AND CKMB (NOT AT ARMC)
CK, MB: 10.7 — ABNORMAL HIGH
CK, MB: 3.6
CK, MB: 8.3 — ABNORMAL HIGH
Total CK: 387 — ABNORMAL HIGH
Total CK: 109

## 2011-04-23 LAB — COMPREHENSIVE METABOLIC PANEL
ALT: 20
AST: 33
Alkaline Phosphatase: 90
CO2: 27
Calcium: 10.1
Chloride: 103
GFR calc Af Amer: >60
GFR calc non Af Amer: 56 — ABNORMAL LOW
Glucose, Bld: 112 — ABNORMAL HIGH
Potassium: 4.2
Sodium: 138
Total Bilirubin: 0.8

## 2011-04-23 LAB — DIFFERENTIAL
Band Neutrophils: 0
Basophils Absolute: 0
Basophils Absolute: 0
Basophils Relative: 0
Basophils Relative: 1
Eosinophils Absolute: 0.2
Lymphs Abs: 1
Lymphs Abs: 0.9
Metamyelocytes Relative: 0
Monocytes Absolute: 0.6
Monocytes Absolute: 0.6
Monocytes Absolute: 0.7
Monocytes Relative: 29 — ABNORMAL HIGH
Myelocytes: 0
Neutro Abs: 0.1 — ABNORMAL LOW
Neutrophils Relative %: 10 — ABNORMAL LOW
Neutrophils Relative %: 5 — ABNORMAL LOW
Neutrophils Relative %: 6 — ABNORMAL LOW
Promyelocytes Absolute: 0

## 2011-04-23 LAB — BASIC METABOLIC PANEL
BUN: 12
CO2: 26
CO2: 26
Calcium: 9.3
Creatinine, Ser: 1.16
GFR calc Af Amer: >60
Glucose, Bld: 102 — ABNORMAL HIGH
Potassium: 4.1
Sodium: 139

## 2011-04-23 LAB — B-NATRIURETIC PEPTIDE (CONVERTED LAB): Pro B Natriuretic peptide (BNP): 52

## 2011-04-23 LAB — TROPONIN I: Troponin I: 0.07 — ABNORMAL HIGH

## 2011-04-23 LAB — CARDIAC PANEL(CRET KIN+CKTOT+MB+TROPI)
CK, MB: 10.3 — ABNORMAL HIGH
Troponin I: 0.07 — ABNORMAL HIGH

## 2011-04-23 LAB — PROTIME-INR: Prothrombin Time: 13.8

## 2011-05-07 LAB — CBC
HCT: 29.2 — ABNORMAL LOW
MCHC: 34
MCV: 91.3
Platelets: 121 — ABNORMAL LOW

## 2011-05-07 LAB — BASIC METABOLIC PANEL
BUN: 20
CO2: 27
Chloride: 104
Creatinine, Ser: 1.36
Glucose, Bld: 85

## 2011-05-07 LAB — PROTIME-INR: Prothrombin Time: 14.4

## 2011-05-30 ENCOUNTER — Telehealth: Payer: Self-pay | Admitting: Oncology

## 2011-05-30 NOTE — Telephone Encounter (Signed)
Could not leave a message on this pt's phone due to no vm available. Mailed the pt a dec appt calerdar. Had to cancel the pt's nov appts due to the new system conversion

## 2011-07-03 ENCOUNTER — Telehealth: Payer: Self-pay | Admitting: *Deleted

## 2011-07-03 ENCOUNTER — Other Ambulatory Visit: Payer: Self-pay | Admitting: Oncology

## 2011-07-03 ENCOUNTER — Ambulatory Visit (HOSPITAL_BASED_OUTPATIENT_CLINIC_OR_DEPARTMENT_OTHER): Payer: Medicare Other | Admitting: Oncology

## 2011-07-03 ENCOUNTER — Other Ambulatory Visit (HOSPITAL_BASED_OUTPATIENT_CLINIC_OR_DEPARTMENT_OTHER): Payer: Medicare Other | Admitting: Lab

## 2011-07-03 VITALS — BP 116/66 | HR 67 | Temp 99.0°F | Ht 70.0 in | Wt 212.1 lb

## 2011-07-03 DIAGNOSIS — C61 Malignant neoplasm of prostate: Secondary | ICD-10-CM

## 2011-07-03 DIAGNOSIS — J984 Other disorders of lung: Secondary | ICD-10-CM

## 2011-07-03 DIAGNOSIS — C8589 Other specified types of non-Hodgkin lymphoma, extranodal and solid organ sites: Secondary | ICD-10-CM

## 2011-07-03 DIAGNOSIS — C8299 Follicular lymphoma, unspecified, extranodal and solid organ sites: Secondary | ICD-10-CM | POA: Insufficient documentation

## 2011-07-03 DIAGNOSIS — D696 Thrombocytopenia, unspecified: Secondary | ICD-10-CM

## 2011-07-03 DIAGNOSIS — D649 Anemia, unspecified: Secondary | ICD-10-CM

## 2011-07-03 LAB — CBC WITH DIFFERENTIAL/PLATELET
Basophils Absolute: 0 10*3/uL (ref 0.0–0.1)
EOS%: 5.2 % (ref 0.0–7.0)
Eosinophils Absolute: 0.4 10*3/uL (ref 0.0–0.5)
HCT: 34.9 % — ABNORMAL LOW (ref 38.4–49.9)
HGB: 11.8 g/dL — ABNORMAL LOW (ref 13.0–17.1)
MCH: 31.8 pg (ref 27.2–33.4)
MCV: 94.5 fL (ref 79.3–98.0)
MONO%: 9.4 % (ref 0.0–14.0)
NEUT#: 3.7 10*3/uL (ref 1.5–6.5)
NEUT%: 49.3 % (ref 39.0–75.0)
Platelets: 129 10*3/uL — ABNORMAL LOW (ref 140–400)
RDW: 14.8 % — ABNORMAL HIGH (ref 11.0–14.6)

## 2011-07-03 NOTE — Telephone Encounter (Signed)
S/w the pt regarding his ct scan appt in jan 2013

## 2011-07-03 NOTE — Progress Notes (Signed)
OFFICE PROGRESS NOTE   INTERVAL HISTORY:   He returns as scheduled. His chief complaint is "neuropathy" affecting the legs and feet. He has occasional shortness of breath. He denies fever, anorexia, and palpable lymph nodes. He continues followup with Dr. Earlene Plater for prostate cancer. He is currently maintained off of hormonal therapy.  Objective:  Vital signs in last 24 hours:  Blood pressure 116/66, pulse 67, temperature 99 F (37.2 C), temperature source Oral, height 5\' 10"  (1.778 m), weight 212 lb 1.6 oz (96.208 kg).    HEENT: Neck without mass Lymphatics: No cervical, supraclavicular, axillary, or inguinal lymph nodes. Prominent right greater than left axillary fat pad. Resp: Lungs clear bilaterally Cardio: Regular rate and rhythm GI: No hepatosplenomegaly Vascular: No leg edema       Lab Results:  Lab Results  Component Value Date   WBC 7.5 07/03/2011   HGB 11.8* 07/03/2011   HCT 34.9* 07/03/2011   MCV 94.5 07/03/2011   PLT 129* 07/03/2011      Medications: I have reviewed the patient's current medications.  Assessment/Plan: 1. Non-Hodgkin's lymphoma (follicular grade 3 lymphoma) - status post 6 cycles of Cytoxan/prednisone/rituximab 07/01/2007 through 10/21/2007.  He remains in clinical remission. 2. Prostate cancer - maintained on intermittent hormonal therapy.  He is followed by Dr. Earlene Plater.   3. History of a normocytic anemia secondary to non-Hodgkin's lymphoma, chemotherapy, and renal insufficiency - stable. 4. Mild thrombocytopenia - chronic. 5. History of bilateral hydronephrosis. 6. Coronary artery disease - status post coronary artery stent placement. 7. History of noncalcified lung nodules. 8. History of severe neutropenia July 2009 - likely related to delayed toxicity from rituximab. 9. Macular degeneration. 10. Right middle lobe density on a CT of the chest 06/18/2010 measuring 2.5 x 1.6 cm with mildly increased metabolic activity (SUV max 2.5)  on a PET scan  06/27/2010.  The area of increased density was less confluent and appeared smaller on a restaging CT 09/08/2010. 11. Mild increased metabolic activity associated with several lymph nodes on the PET scan 06/27/2010 - likely related to lymphoma.   Disposition:  He appears stable. He remains in clinical remission from the non-Hodgkin's lymphoma. He continues followup with Dr. Earlene Plater for treatment of prostate cancer. He will return for an office visit in 6 months.   Lucile Shutters, MD  07/03/2011  1:09 PM

## 2011-07-03 NOTE — Telephone Encounter (Signed)
Left message on voicemail for pt to call office. Dr. Truett Perna suggests non contrast CT scan to follow up previous lung nodule. Schedulers to call with appt.

## 2011-07-09 ENCOUNTER — Encounter: Payer: Self-pay | Admitting: *Deleted

## 2011-08-04 ENCOUNTER — Ambulatory Visit (HOSPITAL_COMMUNITY)
Admission: RE | Admit: 2011-08-04 | Discharge: 2011-08-04 | Disposition: A | Discharge status: Home / Self Care | Payer: Medicare Other | Source: Ambulatory Visit | Attending: Oncology | Admitting: Oncology

## 2011-08-04 DIAGNOSIS — R911 Solitary pulmonary nodule: Secondary | ICD-10-CM | POA: Diagnosis not present

## 2011-08-04 DIAGNOSIS — I712 Thoracic aortic aneurysm, without rupture, unspecified: Secondary | ICD-10-CM | POA: Insufficient documentation

## 2011-08-04 DIAGNOSIS — Z8546 Personal history of malignant neoplasm of prostate: Secondary | ICD-10-CM | POA: Insufficient documentation

## 2011-08-04 DIAGNOSIS — I2510 Atherosclerotic heart disease of native coronary artery without angina pectoris: Secondary | ICD-10-CM | POA: Insufficient documentation

## 2011-08-04 DIAGNOSIS — J984 Other disorders of lung: Secondary | ICD-10-CM | POA: Diagnosis not present

## 2011-08-04 DIAGNOSIS — I700 Atherosclerosis of aorta: Secondary | ICD-10-CM | POA: Insufficient documentation

## 2011-08-04 DIAGNOSIS — Z951 Presence of aortocoronary bypass graft: Secondary | ICD-10-CM | POA: Insufficient documentation

## 2011-08-04 DIAGNOSIS — Z950 Presence of cardiac pacemaker: Secondary | ICD-10-CM | POA: Diagnosis not present

## 2011-08-07 ENCOUNTER — Telehealth: Payer: Self-pay | Admitting: *Deleted

## 2011-08-07 NOTE — Telephone Encounter (Signed)
Called pt with CT results. Negative, per Dr. Truett Perna. Pt verbalized understanding.

## 2011-08-24 DIAGNOSIS — E782 Mixed hyperlipidemia: Secondary | ICD-10-CM | POA: Diagnosis not present

## 2011-08-24 DIAGNOSIS — Z0000 Encounter for general adult medical examination without abnormal findings: Secondary | ICD-10-CM | POA: Diagnosis not present

## 2011-08-24 DIAGNOSIS — F321 Major depressive disorder, single episode, moderate: Secondary | ICD-10-CM | POA: Diagnosis not present

## 2011-08-24 DIAGNOSIS — Z79899 Other long term (current) drug therapy: Secondary | ICD-10-CM | POA: Diagnosis not present

## 2011-08-24 DIAGNOSIS — G609 Hereditary and idiopathic neuropathy, unspecified: Secondary | ICD-10-CM | POA: Diagnosis not present

## 2011-08-25 DIAGNOSIS — E782 Mixed hyperlipidemia: Secondary | ICD-10-CM | POA: Diagnosis not present

## 2011-08-25 DIAGNOSIS — Z79899 Other long term (current) drug therapy: Secondary | ICD-10-CM | POA: Diagnosis not present

## 2011-10-07 DIAGNOSIS — C61 Malignant neoplasm of prostate: Secondary | ICD-10-CM | POA: Diagnosis not present

## 2011-10-28 DIAGNOSIS — Z950 Presence of cardiac pacemaker: Secondary | ICD-10-CM | POA: Diagnosis not present

## 2011-11-02 DIAGNOSIS — R609 Edema, unspecified: Secondary | ICD-10-CM | POA: Diagnosis not present

## 2011-11-23 DIAGNOSIS — R5381 Other malaise: Secondary | ICD-10-CM | POA: Diagnosis not present

## 2011-11-23 DIAGNOSIS — H35359 Cystoid macular degeneration, unspecified eye: Secondary | ICD-10-CM | POA: Diagnosis not present

## 2011-11-23 DIAGNOSIS — I129 Hypertensive chronic kidney disease with stage 1 through stage 4 chronic kidney disease, or unspecified chronic kidney disease: Secondary | ICD-10-CM | POA: Diagnosis not present

## 2011-11-23 DIAGNOSIS — R609 Edema, unspecified: Secondary | ICD-10-CM | POA: Diagnosis not present

## 2011-12-25 ENCOUNTER — Telehealth: Payer: Self-pay | Admitting: Oncology

## 2011-12-25 NOTE — Telephone Encounter (Signed)
called pt Edward Woodward that his appt on 06/07 was moved to 06/14 and to rtn call to confirm appt

## 2012-01-01 ENCOUNTER — Ambulatory Visit: Payer: Medicare Other | Admitting: Oncology

## 2012-01-01 ENCOUNTER — Other Ambulatory Visit: Payer: Medicare Other | Admitting: Lab

## 2012-01-04 DIAGNOSIS — E782 Mixed hyperlipidemia: Secondary | ICD-10-CM | POA: Diagnosis not present

## 2012-01-04 DIAGNOSIS — I872 Venous insufficiency (chronic) (peripheral): Secondary | ICD-10-CM | POA: Diagnosis not present

## 2012-01-04 DIAGNOSIS — I129 Hypertensive chronic kidney disease with stage 1 through stage 4 chronic kidney disease, or unspecified chronic kidney disease: Secondary | ICD-10-CM | POA: Diagnosis not present

## 2012-01-04 DIAGNOSIS — Z79899 Other long term (current) drug therapy: Secondary | ICD-10-CM | POA: Diagnosis not present

## 2012-01-04 DIAGNOSIS — J309 Allergic rhinitis, unspecified: Secondary | ICD-10-CM | POA: Diagnosis not present

## 2012-01-08 ENCOUNTER — Ambulatory Visit: Payer: Medicare Other | Admitting: Oncology

## 2012-01-08 ENCOUNTER — Telehealth: Payer: Self-pay | Admitting: *Deleted

## 2012-01-08 ENCOUNTER — Other Ambulatory Visit: Payer: Medicare Other | Admitting: Lab

## 2012-01-08 ENCOUNTER — Telehealth: Payer: Self-pay | Admitting: Oncology

## 2012-01-08 NOTE — Telephone Encounter (Signed)
called pt and r/s missed appt today to 06/27

## 2012-01-08 NOTE — Telephone Encounter (Signed)
Called patient to ensure he was OK--FTKA today, which is unusual. He was not aware of the appointment. Told him we will reschedule and call him. POF to scheduler.

## 2012-01-11 DIAGNOSIS — R972 Elevated prostate specific antigen [PSA]: Secondary | ICD-10-CM | POA: Diagnosis not present

## 2012-01-21 ENCOUNTER — Ambulatory Visit (HOSPITAL_BASED_OUTPATIENT_CLINIC_OR_DEPARTMENT_OTHER): Payer: Medicare Other | Admitting: Oncology

## 2012-01-21 ENCOUNTER — Telehealth: Payer: Self-pay | Admitting: Oncology

## 2012-01-21 ENCOUNTER — Other Ambulatory Visit: Payer: Medicare Other | Admitting: Lab

## 2012-01-21 VITALS — BP 148/82 | HR 74 | Temp 97.6°F | Ht 70.0 in | Wt 217.3 lb

## 2012-01-21 DIAGNOSIS — D696 Thrombocytopenia, unspecified: Secondary | ICD-10-CM | POA: Diagnosis not present

## 2012-01-21 DIAGNOSIS — D638 Anemia in other chronic diseases classified elsewhere: Secondary | ICD-10-CM | POA: Diagnosis not present

## 2012-01-21 DIAGNOSIS — C8589 Other specified types of non-Hodgkin lymphoma, extranodal and solid organ sites: Secondary | ICD-10-CM | POA: Diagnosis not present

## 2012-01-21 DIAGNOSIS — C61 Malignant neoplasm of prostate: Secondary | ICD-10-CM

## 2012-01-21 DIAGNOSIS — D649 Anemia, unspecified: Secondary | ICD-10-CM

## 2012-01-21 LAB — CBC WITH DIFFERENTIAL/PLATELET
Basophils Absolute: 0 10*3/uL (ref 0.0–0.1)
EOS%: 10.6 % — ABNORMAL HIGH (ref 0.0–7.0)
Eosinophils Absolute: 0.9 10*3/uL — ABNORMAL HIGH (ref 0.0–0.5)
HGB: 11.9 g/dL — ABNORMAL LOW (ref 13.0–17.1)
LYMPH%: 30.2 % (ref 14.0–49.0)
MCH: 31.7 pg (ref 27.2–33.4)
MCV: 93.6 fL (ref 79.3–98.0)
MONO%: 10 % (ref 0.0–14.0)
NEUT#: 4 10*3/uL (ref 1.5–6.5)
Platelets: 131 10*3/uL — ABNORMAL LOW (ref 140–400)
RBC: 3.76 10*6/uL — ABNORMAL LOW (ref 4.20–5.82)
RDW: 15.1 % — ABNORMAL HIGH (ref 11.0–14.6)

## 2012-01-21 NOTE — Telephone Encounter (Signed)
Gave pt appt calendar for September 2013 lab and MD °

## 2012-01-21 NOTE — Progress Notes (Signed)
   [facility]    OFFICE PROGRESS NOTE   INTERVAL HISTORY:   He returns after missing a scheduled visit. He continues androgen depravation therapy under the direction of Dr. Earlene Plater. He has hot flashes. No fever, anorexia, or night sweats. He reports decreased energy. He continues to play golf. He is walking less do 2 neuropathy. Leg swelling has improved with support stockings.  His wife reports that he has developed mild confusion.  Objective:  Vital signs in last 24 hours:  Blood pressure 148/82, pulse 74, temperature 97.6 F (36.4 C), temperature source Oral, height 5\' 10"  (1.778 m), weight 217 lb 4.8 oz (98.567 kg).    HEENT: Neck without mass Lymphatics: No cervical, supraclavicular, or inguinal nodes. Prominent right axillary fat pad versus a 2-3 cm soft mobile lymph node Resp: And inspiratory rhonchi at the right base. No respiratory distress Cardio: Regular rate and rhythm GI: Nontender, no hepatosplenomegaly Vascular: Support stockings in place bilaterally, no edema Neuro: Alert and oriented     Lab Results:  Lab Results  Component Value Date   WBC 8.1 01/21/2012   HGB 11.9* 01/21/2012   HCT 35.2* 01/21/2012   MCV 93.6 01/21/2012   PLT 131* 01/21/2012      Medications: I have reviewed the patient's current medications.  Assessment/Plan: 1. Non-Hodgkin's lymphoma (follicular grade 3 lymphoma) - status post 6 cycles of Cytoxan/prednisone/rituximab 07/01/2007 through 10/21/2007. He remains in clinical remission. 2. Prostate cancer - maintained on intermittent hormonal therapy. He is followed by Dr. Earlene Plater.  3. History of a normocytic anemia secondary to non-Hodgkin's lymphoma, chemotherapy, and renal insufficiency - stable. 4. Mild thrombocytopenia - chronic. 5. History of bilateral hydronephrosis. 6. Coronary artery disease - status post coronary artery stent placement. 7. History of noncalcified lung nodules. 8. History of severe neutropenia July  2009 - likely related to delayed toxicity from rituximab. 9. Macular degeneration. 10. Right middle lobe density on a CT of the chest 06/18/2010 measuring 2.5 x 1.6 cm with mildly increased metabolic activity (SUV max 2.5) on a PET scan 06/27/2010. The area of increased density was less confluent and appeared smaller on a restaging CT 09/08/2010. Right middle lobe "scarring "with no new or suspicious pulmonary nodule on a CT 08/04/2011. 11. Mild increased metabolic activity associated with several lymph nodes on the PET scan 06/27/2010 - likely related to lymphoma. No lymphadenopathy in the mediastinum or axillary areas on the CT 08/04/2011. 12. Malaise-likely multifactorial 13. "Confusion "-? Secondary to age related dementia versus polypharmacy  Disposition:  I have a low suspicion for clinical progression of the non-Hodgkin's lymphoma. The right axillary finding is most likely related to a prominent fat pad. He will return for an office visit and lymph node examination in 3 months.  Edward Woodward will followup with Dr. Pete Woodward for evaluation of the malaise and altered mental status. The patient or his wife will contact us in the interim if he develops new symptoms.  He has a history of hydronephrosis and the creatinine has not been checked here since November of 2011 (1.1)  Thornton Papas, MD  01/21/2012  12:29 PM

## 2012-02-01 DIAGNOSIS — E782 Mixed hyperlipidemia: Secondary | ICD-10-CM | POA: Diagnosis not present

## 2012-02-01 DIAGNOSIS — Z79899 Other long term (current) drug therapy: Secondary | ICD-10-CM | POA: Diagnosis not present

## 2012-02-01 DIAGNOSIS — J309 Allergic rhinitis, unspecified: Secondary | ICD-10-CM | POA: Diagnosis not present

## 2012-02-01 DIAGNOSIS — I129 Hypertensive chronic kidney disease with stage 1 through stage 4 chronic kidney disease, or unspecified chronic kidney disease: Secondary | ICD-10-CM | POA: Diagnosis not present

## 2012-02-08 DIAGNOSIS — Z950 Presence of cardiac pacemaker: Secondary | ICD-10-CM | POA: Diagnosis not present

## 2012-02-08 DIAGNOSIS — I10 Essential (primary) hypertension: Secondary | ICD-10-CM | POA: Diagnosis not present

## 2012-02-08 DIAGNOSIS — R0609 Other forms of dyspnea: Secondary | ICD-10-CM | POA: Diagnosis not present

## 2012-02-08 DIAGNOSIS — I495 Sick sinus syndrome: Secondary | ICD-10-CM | POA: Diagnosis not present

## 2012-02-08 DIAGNOSIS — I2510 Atherosclerotic heart disease of native coronary artery without angina pectoris: Secondary | ICD-10-CM | POA: Diagnosis not present

## 2012-02-15 DIAGNOSIS — I129 Hypertensive chronic kidney disease with stage 1 through stage 4 chronic kidney disease, or unspecified chronic kidney disease: Secondary | ICD-10-CM | POA: Diagnosis not present

## 2012-02-15 DIAGNOSIS — G609 Hereditary and idiopathic neuropathy, unspecified: Secondary | ICD-10-CM | POA: Diagnosis not present

## 2012-02-15 DIAGNOSIS — E782 Mixed hyperlipidemia: Secondary | ICD-10-CM | POA: Diagnosis not present

## 2012-04-21 ENCOUNTER — Ambulatory Visit (HOSPITAL_BASED_OUTPATIENT_CLINIC_OR_DEPARTMENT_OTHER): Payer: Medicare Other | Admitting: Oncology

## 2012-04-21 ENCOUNTER — Other Ambulatory Visit (HOSPITAL_BASED_OUTPATIENT_CLINIC_OR_DEPARTMENT_OTHER): Payer: Medicare Other | Admitting: Lab

## 2012-04-21 ENCOUNTER — Telehealth: Payer: Self-pay | Admitting: *Deleted

## 2012-04-21 ENCOUNTER — Telehealth: Payer: Self-pay | Admitting: Oncology

## 2012-04-21 VITALS — BP 137/76 | HR 77 | Temp 97.3°F | Resp 18 | Ht 70.0 in | Wt 216.5 lb

## 2012-04-21 DIAGNOSIS — C61 Malignant neoplasm of prostate: Secondary | ICD-10-CM | POA: Diagnosis not present

## 2012-04-21 DIAGNOSIS — C8589 Other specified types of non-Hodgkin lymphoma, extranodal and solid organ sites: Secondary | ICD-10-CM | POA: Diagnosis not present

## 2012-04-21 DIAGNOSIS — D696 Thrombocytopenia, unspecified: Secondary | ICD-10-CM | POA: Diagnosis not present

## 2012-04-21 DIAGNOSIS — D509 Iron deficiency anemia, unspecified: Secondary | ICD-10-CM

## 2012-04-21 LAB — CBC WITH DIFFERENTIAL/PLATELET
BASO%: 0.3 % (ref 0.0–2.0)
EOS%: 7.1 % — ABNORMAL HIGH (ref 0.0–7.0)
LYMPH%: 30.2 % (ref 14.0–49.0)
MCHC: 34.3 g/dL (ref 32.0–36.0)
MCV: 94.2 fL (ref 79.3–98.0)
MONO%: 10.4 % (ref 0.0–14.0)
Platelets: 140 10*3/uL (ref 140–400)
RBC: 3.75 10*6/uL — ABNORMAL LOW (ref 4.20–5.82)
RDW: 14.3 % (ref 11.0–14.6)

## 2012-04-21 LAB — BASIC METABOLIC PANEL (CC13)
Calcium: 10.5 mg/dL — ABNORMAL HIGH (ref 8.4–10.4)
Potassium: 4.3 mEq/L (ref 3.5–5.1)
Sodium: 138 mEq/L (ref 136–145)

## 2012-04-21 NOTE — Telephone Encounter (Signed)
gv and printed schedule for pt. °

## 2012-04-21 NOTE — Progress Notes (Signed)
    [facility]    OFFICE PROGRESS NOTE   INTERVAL HISTORY:   He returns as scheduled. No fever, night sweats, or anorexia. He reports an improved energy level. He has intermittent hot flashes.  Objective:  Vital signs in last 24 hours:  Blood pressure 137/76, pulse 77, temperature 97.3 F (36.3 C), temperature source Oral, resp. rate 18, height 5\' 10"  (1.778 m), weight 216 lb 8 oz (98.204 kg).    HEENT: Neck without mass Lymphatics: No cervical, supraclavicular, or inguinal nodes. 3 cm soft mobile right axillary node versus a prominent fat pad. No left axillary nodes Resp: End inspiratory rhonchi at the bases bilaterally, no respiratory distress Cardio: Regular rate and rhythm GI: No hepatosplenomegaly Vascular: No leg edema   Lab Results:  Lab Results  Component Value Date   WBC 7.9 04/21/2012   HGB 12.1* 04/21/2012   HCT 35.3* 04/21/2012   MCV 94.2 04/21/2012   PLT 140 04/21/2012      Medications: I have reviewed the patient's current medications.  Assessment/Plan: 1. Non-Hodgkin's lymphoma (follicular grade 3 lymphoma) - status post 6 cycles of Cytoxan/prednisone/rituximab 07/01/2007 through 10/21/2007. He remains in clinical remission. 2. Prostate cancer - maintained on intermittent hormonal therapy. He is followed by Dr. Earlene Plater.  3. History of a normocytic anemia secondary to non-Hodgkin's lymphoma, chemotherapy, and renal insufficiency - stable. 4. Mild thrombocytopenia - chronic. 5. History of bilateral hydronephrosis. 6. Coronary artery disease - status post coronary artery stent placement. 7. History of noncalcified lung nodules. 8. History of severe neutropenia July 2009 - likely related to delayed toxicity from rituximab. 9. Macular degeneration. 10. Right middle lobe density on a CT of the chest 06/18/2010 measuring 2.5 x 1.6 cm with mildly increased metabolic activity (SUV max 2.5) on a PET scan 06/27/2010. The area of increased density was  less confluent and appeared smaller on a restaging CT 09/08/2010. Right middle lobe "scarring "with no new or suspicious pulmonary nodule on a CT 08/04/2011. 11. Mild increased metabolic activity associated with several lymph nodes on the PET scan 06/27/2010 - likely related to lymphoma. No lymphadenopathy in the mediastinum or axillary areas on the CT 08/04/2011. Disposition:  He has an improved performance status compared to when he was here in June. The right axillary fullness is stable. He will contact us if this area changes.  Mr. Golliher will return for an office visit in 6 months.    Thornton Papas, MD  04/21/2012  2:07 PM

## 2012-04-21 NOTE — Telephone Encounter (Signed)
Notified patient that kidney functions are elevated, but have been in past per Dr. Truett Perna. Copy forwarded to Dr. Pete Glatter. Will recheck Cmet in 2 weeks. POF to scheduler. Instructed him to be sure he hydrates himself well.

## 2012-04-22 ENCOUNTER — Telehealth: Payer: Self-pay | Admitting: *Deleted

## 2012-04-22 NOTE — Telephone Encounter (Signed)
Patient has been given the six month appointment

## 2012-04-25 DIAGNOSIS — R3915 Urgency of urination: Secondary | ICD-10-CM | POA: Insufficient documentation

## 2012-04-25 DIAGNOSIS — R972 Elevated prostate specific antigen [PSA]: Secondary | ICD-10-CM | POA: Diagnosis not present

## 2012-04-25 DIAGNOSIS — N476 Balanoposthitis: Secondary | ICD-10-CM | POA: Diagnosis not present

## 2012-04-25 DIAGNOSIS — C61 Malignant neoplasm of prostate: Secondary | ICD-10-CM | POA: Diagnosis not present

## 2012-04-25 DIAGNOSIS — N481 Balanitis: Secondary | ICD-10-CM | POA: Insufficient documentation

## 2012-04-26 ENCOUNTER — Other Ambulatory Visit: Payer: Self-pay | Admitting: *Deleted

## 2012-04-26 ENCOUNTER — Telehealth: Payer: Self-pay | Admitting: *Deleted

## 2012-04-26 DIAGNOSIS — C61 Malignant neoplasm of prostate: Secondary | ICD-10-CM

## 2012-04-26 DIAGNOSIS — C8589 Other specified types of non-Hodgkin lymphoma, extranodal and solid organ sites: Secondary | ICD-10-CM

## 2012-04-26 NOTE — Progress Notes (Signed)
Per Dr. Truett Perna, will schedule in office with PSA in 1 month.

## 2012-04-26 NOTE — Telephone Encounter (Signed)
DR.RON DAVIS AT [facility] WOULD LIKE DR.SHERRILL TO SEE PT. FOR TREATMENT. THIS NOTE TO DR.SHERRILL.

## 2012-04-27 ENCOUNTER — Telehealth: Payer: Self-pay | Admitting: *Deleted

## 2012-04-27 DIAGNOSIS — C61 Malignant neoplasm of prostate: Secondary | ICD-10-CM | POA: Diagnosis not present

## 2012-04-27 NOTE — Telephone Encounter (Signed)
Left voice message on home phone the new date and time of the one month appointment with the midlevel

## 2012-05-02 DIAGNOSIS — R269 Unspecified abnormalities of gait and mobility: Secondary | ICD-10-CM | POA: Diagnosis not present

## 2012-05-02 DIAGNOSIS — I129 Hypertensive chronic kidney disease with stage 1 through stage 4 chronic kidney disease, or unspecified chronic kidney disease: Secondary | ICD-10-CM | POA: Diagnosis not present

## 2012-05-02 DIAGNOSIS — Z23 Encounter for immunization: Secondary | ICD-10-CM | POA: Diagnosis not present

## 2012-05-24 DIAGNOSIS — Z961 Presence of intraocular lens: Secondary | ICD-10-CM | POA: Diagnosis not present

## 2012-05-24 DIAGNOSIS — H35359 Cystoid macular degeneration, unspecified eye: Secondary | ICD-10-CM | POA: Diagnosis not present

## 2012-05-24 DIAGNOSIS — H353290 Exudative age-related macular degeneration, unspecified eye, stage unspecified: Secondary | ICD-10-CM | POA: Diagnosis not present

## 2012-05-24 DIAGNOSIS — H26499 Other secondary cataract, unspecified eye: Secondary | ICD-10-CM | POA: Diagnosis not present

## 2012-05-31 ENCOUNTER — Ambulatory Visit (HOSPITAL_BASED_OUTPATIENT_CLINIC_OR_DEPARTMENT_OTHER): Payer: Medicare Other | Admitting: Nurse Practitioner

## 2012-05-31 ENCOUNTER — Other Ambulatory Visit (HOSPITAL_BASED_OUTPATIENT_CLINIC_OR_DEPARTMENT_OTHER): Payer: Medicare Other | Admitting: Lab

## 2012-05-31 VITALS — BP 141/71 | HR 74 | Temp 97.0°F | Resp 20 | Ht 70.0 in | Wt 222.0 lb

## 2012-05-31 DIAGNOSIS — C8589 Other specified types of non-Hodgkin lymphoma, extranodal and solid organ sites: Secondary | ICD-10-CM | POA: Diagnosis not present

## 2012-05-31 DIAGNOSIS — C61 Malignant neoplasm of prostate: Secondary | ICD-10-CM

## 2012-05-31 DIAGNOSIS — R948 Abnormal results of function studies of other organs and systems: Secondary | ICD-10-CM

## 2012-05-31 DIAGNOSIS — R972 Elevated prostate specific antigen [PSA]: Secondary | ICD-10-CM | POA: Diagnosis not present

## 2012-05-31 DIAGNOSIS — Z950 Presence of cardiac pacemaker: Secondary | ICD-10-CM | POA: Diagnosis not present

## 2012-05-31 LAB — COMPREHENSIVE METABOLIC PANEL (CC13)
AST: 22 U/L (ref 5–34)
Alkaline Phosphatase: 68 U/L (ref 40–150)
BUN: 24 mg/dL (ref 7.0–26.0)
Creatinine: 1.4 mg/dL — ABNORMAL HIGH (ref 0.7–1.3)
Glucose: 85 mg/dl (ref 70–99)
Potassium: 4 mEq/L (ref 3.5–5.1)
Sodium: 141 mEq/L (ref 136–145)

## 2012-05-31 NOTE — Progress Notes (Signed)
OFFICE PROGRESS NOTE  Interval history:  Mr. Grabel returns prior to scheduled followup. Our office was contacted by Dr. Earlene Plater, urology, requesting a sooner appointment due to a recent increase in the PSA. We do not have the PSA value or Dr. Ailene Ravel note. Mr. Pecore reports the PSA was "5 or 6" and he received a Lupron injection recently.  He denies fevers, night sweats. He continues to have occasional hot flashes. He has a good appetite. He has occasional pain at the right hip. He has bilateral lower leg pain, mainly in the mornings. He attributes the lower leg pain to neuropathy.   Objective: Blood pressure 141/71, pulse 74, temperature 97 F (36.1 C), temperature source Oral, resp. rate 20, height 5\' 10"  (1.778 m), weight 222 lb (100.699 kg).  Oropharynx is without thrush or ulceration. No palpable cervical, supraclavicular or inguinal lymph nodes. Question biaxillary nodes versus prominent fat pads, right greater than left. Lungs are clear. Regular cardiac rhythm. Abdomen soft and nontender. No organomegaly. Pitting pretibial edema.  Lab Results: Lab Results  Component Value Date   WBC 7.9 04/21/2012   HGB 12.1* 04/21/2012   HCT 35.3* 04/21/2012   MCV 94.2 04/21/2012   PLT 140 04/21/2012    Chemistry:    Chemistry      Component Value Date/Time   NA 138 04/21/2012 1135   NA 136 06/19/2010 0415   K 4.3 04/21/2012 1135   K 4.1 06/19/2010 0415   CL 104 04/21/2012 1135   CL 107 06/19/2010 0415   CO2 25 04/21/2012 1135   CO2 24 06/19/2010 0415   BUN 29.0* 04/21/2012 1135   BUN 19 06/19/2010 0415   CREATININE 1.4* 04/21/2012 1135   CREATININE 1.10 06/19/2010 0415      Component Value Date/Time   CALCIUM 10.5* 04/21/2012 1135   CALCIUM 9.2 06/19/2010 0415   CALCIUM 10.4 06/24/2007 0855   ALKPHOS 64 06/18/2010 0043   AST 23 06/18/2010 0043   ALT 17 06/18/2010 0043   BILITOT 0.4 06/18/2010 0043       Studies/Results: No results found.  Medications: I have reviewed the  patient's current medications.  Assessment/Plan:  1. Non-Hodgkin's lymphoma (follicular grade 3 lymphoma) - status post 6 cycles of Cytoxan/prednisone/rituximab 07/01/2007 through 10/21/2007. He remains in clinical remission. 2. Prostate cancer - maintained on intermittent hormonal therapy. He is followed by Dr. Earlene Plater.  3. History of a normocytic anemia secondary to non-Hodgkin's lymphoma, chemotherapy, and renal insufficiency - stable on labs 04/21/2012. 4. Mild thrombocytopenia - chronic. 5. History of bilateral hydronephrosis. 6. Coronary artery disease - status post coronary artery stent placement. 7. History of noncalcified lung nodules. 8. History of severe neutropenia July 2009 - likely related to delayed toxicity from rituximab. 9. Macular degeneration. 10. Right middle lobe density on a CT of the chest 06/18/2010 measuring 2.5 x 1.6 cm with mildly increased metabolic activity (SUV max 2.5) on a PET scan 06/27/2010. The area of increased density was less confluent and appeared smaller on a restaging CT 09/08/2010. Right middle lobe "scarring "with no new or suspicious pulmonary nodule on a CT 08/04/2011. 11. Mild increased metabolic activity associated with several lymph nodes on the PET scan 06/27/2010 - likely related to lymphoma. No lymphadenopathy in the mediastinum or axillary areas on the CT 08/04/2011. 12. Recent increase in the PSA.  Disposition-we will contact Dr. Earlene Plater' office for records. Dr. Truett Perna will make a recommendation pending review. We did have a preliminary discussion regarding initiation of Casodex. We will  contact Mr. Dorris once the records are received/reviewed.  Patient seen with Dr. Truett Perna.  Lonna Cobb ANP/GNP-BC

## 2012-07-04 DIAGNOSIS — Z9221 Personal history of antineoplastic chemotherapy: Secondary | ICD-10-CM | POA: Diagnosis not present

## 2012-07-04 DIAGNOSIS — G609 Hereditary and idiopathic neuropathy, unspecified: Secondary | ICD-10-CM | POA: Diagnosis not present

## 2012-07-04 DIAGNOSIS — R29898 Other symptoms and signs involving the musculoskeletal system: Secondary | ICD-10-CM | POA: Diagnosis not present

## 2012-07-04 DIAGNOSIS — Z880 Allergy status to penicillin: Secondary | ICD-10-CM | POA: Diagnosis not present

## 2012-07-04 DIAGNOSIS — C8589 Other specified types of non-Hodgkin lymphoma, extranodal and solid organ sites: Secondary | ICD-10-CM | POA: Diagnosis not present

## 2012-07-04 DIAGNOSIS — I2510 Atherosclerotic heart disease of native coronary artery without angina pectoris: Secondary | ICD-10-CM | POA: Diagnosis not present

## 2012-07-04 DIAGNOSIS — Z87891 Personal history of nicotine dependence: Secondary | ICD-10-CM | POA: Diagnosis not present

## 2012-07-04 DIAGNOSIS — R51 Headache: Secondary | ICD-10-CM | POA: Diagnosis not present

## 2012-07-04 DIAGNOSIS — Z9889 Other specified postprocedural states: Secondary | ICD-10-CM | POA: Diagnosis not present

## 2012-07-04 DIAGNOSIS — IMO0002 Reserved for concepts with insufficient information to code with codable children: Secondary | ICD-10-CM | POA: Diagnosis not present

## 2012-07-04 DIAGNOSIS — C61 Malignant neoplasm of prostate: Secondary | ICD-10-CM | POA: Diagnosis not present

## 2012-07-04 DIAGNOSIS — G589 Mononeuropathy, unspecified: Secondary | ICD-10-CM | POA: Diagnosis not present

## 2012-07-04 DIAGNOSIS — R259 Unspecified abnormal involuntary movements: Secondary | ICD-10-CM | POA: Diagnosis not present

## 2012-07-04 DIAGNOSIS — G4733 Obstructive sleep apnea (adult) (pediatric): Secondary | ICD-10-CM | POA: Diagnosis not present

## 2012-07-04 DIAGNOSIS — Z951 Presence of aortocoronary bypass graft: Secondary | ICD-10-CM | POA: Diagnosis not present

## 2012-07-11 DIAGNOSIS — C61 Malignant neoplasm of prostate: Secondary | ICD-10-CM | POA: Diagnosis not present

## 2012-07-25 DIAGNOSIS — E291 Testicular hypofunction: Secondary | ICD-10-CM | POA: Diagnosis not present

## 2012-07-25 DIAGNOSIS — C61 Malignant neoplasm of prostate: Secondary | ICD-10-CM | POA: Diagnosis not present

## 2012-08-24 ENCOUNTER — Telehealth: Payer: Self-pay | Admitting: *Deleted

## 2012-08-24 NOTE — Telephone Encounter (Signed)
Requests appointment with Dr. Truett Perna soon. Dr. Earlene Plater said the Lupron injections no longer working and PSA is going higher. Was last seen 5-6 weeks ago with labs and last injection. Will have HIM get records from that visit and Dr. Truett Perna will review to get appointment scheduled.

## 2012-08-26 NOTE — Telephone Encounter (Signed)
Called patient to inform him appointment scheduled for 2/12 at 1030.  Pt repeated appt date and time and confirmed.

## 2012-09-01 DIAGNOSIS — M25559 Pain in unspecified hip: Secondary | ICD-10-CM | POA: Diagnosis not present

## 2012-09-06 DIAGNOSIS — G4733 Obstructive sleep apnea (adult) (pediatric): Secondary | ICD-10-CM | POA: Diagnosis not present

## 2012-09-06 DIAGNOSIS — E7800 Pure hypercholesterolemia, unspecified: Secondary | ICD-10-CM | POA: Diagnosis not present

## 2012-09-06 DIAGNOSIS — I2510 Atherosclerotic heart disease of native coronary artery without angina pectoris: Secondary | ICD-10-CM | POA: Diagnosis not present

## 2012-09-06 DIAGNOSIS — Z950 Presence of cardiac pacemaker: Secondary | ICD-10-CM | POA: Diagnosis not present

## 2012-09-06 DIAGNOSIS — I10 Essential (primary) hypertension: Secondary | ICD-10-CM | POA: Diagnosis not present

## 2012-09-06 DIAGNOSIS — I495 Sick sinus syndrome: Secondary | ICD-10-CM | POA: Diagnosis not present

## 2012-09-06 DIAGNOSIS — I209 Angina pectoris, unspecified: Secondary | ICD-10-CM | POA: Diagnosis not present

## 2012-09-07 ENCOUNTER — Telehealth: Payer: Self-pay | Admitting: Oncology

## 2012-09-07 ENCOUNTER — Ambulatory Visit (HOSPITAL_BASED_OUTPATIENT_CLINIC_OR_DEPARTMENT_OTHER): Payer: Medicare Other | Admitting: Oncology

## 2012-09-07 ENCOUNTER — Ambulatory Visit (HOSPITAL_BASED_OUTPATIENT_CLINIC_OR_DEPARTMENT_OTHER): Payer: Medicare Other | Admitting: Lab

## 2012-09-07 VITALS — BP 163/87 | HR 72 | Temp 98.7°F | Resp 18 | Ht 70.0 in | Wt 221.3 lb

## 2012-09-07 DIAGNOSIS — C8589 Other specified types of non-Hodgkin lymphoma, extranodal and solid organ sites: Secondary | ICD-10-CM

## 2012-09-07 DIAGNOSIS — M25559 Pain in unspecified hip: Secondary | ICD-10-CM | POA: Diagnosis not present

## 2012-09-07 DIAGNOSIS — C61 Malignant neoplasm of prostate: Secondary | ICD-10-CM

## 2012-09-07 DIAGNOSIS — D696 Thrombocytopenia, unspecified: Secondary | ICD-10-CM

## 2012-09-07 LAB — PSA: PSA: 5.45 ng/mL — ABNORMAL HIGH (ref <=4.00)

## 2012-09-07 NOTE — Progress Notes (Signed)
[facility]    OFFICE PROGRESS NOTE   INTERVAL HISTORY:   He returns as scheduled. When he saw Dr. Earlene Plater this fall the PSA was higher. Lupron was discontinued. He is referred to consider treatment of hormone refractory prostate cancer.  Edward Woodward he reports feeling "weak". He continues to have neuropathy symptoms in the legs. No fever, night sweats, or anorexia.  He has pain in the right "hip ". The pain is worse  with standing. The pain improved after he begins moving.He has discomfort at the right low posterior chest.  He has urinary urgency. No significant nocturia.  Objective:  Vital signs in last 24 hours:  Blood pressure 163/87, pulse 72, temperature 98.7 F (37.1 C), temperature source Oral, resp. rate 18, height 5\' 10"  (1.778 m), weight 221 lb 4.8 oz (100.381 kg).    HEENT: neck without mass Lymphatics: no cervical, supraclavicular, axillary, or inguinal nodes Resp: lungs clear bilaterally Cardio: regular rate and rhythm GI: no hepatomegaly, nontender Vascular: no leg edema   Lab Results:  Lab Results  Component Value Date   WBC 7.9 04/21/2012   HGB 12.1* 04/21/2012   HCT 35.3* 04/21/2012   MCV 94.2 04/21/2012   PLT 140 04/21/2012    PSA on 07/11/2012-6.63  Medications: I have reviewed the patient's current medications.  Assessment/Plan: 1. Non-Hodgkin's lymphoma (follicular grade 3 lymphoma) - status post 6 cycles of Cytoxan/prednisone/rituximab 07/01/2007 through 10/21/2007. He remains in clinical remission. 2. Prostate cancer - maintained on intermittent hormonal therapy. the PSA was higher in the fall of 2013. Hormonal therapy was discontinued. 3. History of a normocytic anemia secondary to non-Hodgkin's lymphoma, chemotherapy, and renal insufficiency - stable on labs 04/21/2012. 4. Mild thrombocytopenia - chronic. 5. History of bilateral hydronephrosis. 6. Coronary artery disease - status post coronary artery stent  placement. 7. History of noncalcified lung nodules. 8. History of severe neutropenia July 2009 - likely related to delayed toxicity from rituximab. 9. Macular degeneration. 10. Right middle lobe density on a CT of the chest 06/18/2010 measuring 2.5 x 1.6 cm with mildly increased metabolic activity (SUV max 2.5) on a PET scan 06/27/2010. The area of increased density was less confluent and appeared smaller on a restaging CT 09/08/2010. Right middle lobe "scarring "with no new or suspicious pulmonary nodule on a CT 08/04/2011. 11. Mild increased metabolic activity associated with several lymph nodes on the PET scan 06/27/2010 - likely related to lymphoma. No lymphadenopathy in the mediastinum or axillary areas on the CT 08/04/2011. 12. Right hip discomfort-he received a steroid injection by his primary physician without improvement. He has been referred to orthopedics   Disposition:  He has a history of low-grade non-Hodgkin,. There is no clinical evidence for progression of the lymphoma.  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 I doubt the hip discomfort is related to cancer. The pain improves with ambulation and is not constant.  We discussed treatment options for prostate cancer that is refractory to Lupron. We discussed other hormonal maneuvers and systemic chemotherapy. He appears asymptomatic from the prostate cancer present. We decided to obtain a bone scan. If the bone scan is negative we will continue an observation approach. The PSA will be checked today.  Edward Woodward is scheduled to see Dr. Earlene Plater next month. He will return for an office visit here in 2 months. He plans to followup with [facility] orthopedics to evaluate the hip discomfort.   Thornton Papas, MD  09/07/2012  7:02 PM

## 2012-09-07 NOTE — Telephone Encounter (Signed)
gv and printed appt shedule for pt for Feb, March and April

## 2012-09-08 ENCOUNTER — Telehealth: Payer: Self-pay | Admitting: *Deleted

## 2012-09-08 DIAGNOSIS — I495 Sick sinus syndrome: Secondary | ICD-10-CM | POA: Diagnosis not present

## 2012-09-08 DIAGNOSIS — Z950 Presence of cardiac pacemaker: Secondary | ICD-10-CM | POA: Diagnosis not present

## 2012-09-08 NOTE — Telephone Encounter (Signed)
Message copied by Wandalee Ferdinand on Thu Sep 08, 2012 11:22 AM ------      Message from: Thornton Papas B      Created: Thu Sep 08, 2012  8:51 AM       Please call patient, psa is stable ------

## 2012-09-10 ENCOUNTER — Other Ambulatory Visit: Payer: Self-pay

## 2012-09-14 ENCOUNTER — Other Ambulatory Visit: Payer: Medicare Other | Admitting: Lab

## 2012-09-14 DIAGNOSIS — M25559 Pain in unspecified hip: Secondary | ICD-10-CM | POA: Diagnosis not present

## 2012-09-20 DIAGNOSIS — Z79899 Other long term (current) drug therapy: Secondary | ICD-10-CM | POA: Diagnosis not present

## 2012-09-20 DIAGNOSIS — Z1331 Encounter for screening for depression: Secondary | ICD-10-CM | POA: Diagnosis not present

## 2012-09-20 DIAGNOSIS — Z0000 Encounter for general adult medical examination without abnormal findings: Secondary | ICD-10-CM | POA: Diagnosis not present

## 2012-09-20 DIAGNOSIS — E782 Mixed hyperlipidemia: Secondary | ICD-10-CM | POA: Diagnosis not present

## 2012-09-21 ENCOUNTER — Encounter (HOSPITAL_COMMUNITY)
Admission: RE | Admit: 2012-09-21 | Discharge: 2012-09-21 | Disposition: A | Discharge status: Home / Self Care | Payer: Medicare Other | Source: Ambulatory Visit | Attending: Oncology | Admitting: Oncology

## 2012-09-21 DIAGNOSIS — R972 Elevated prostate specific antigen [PSA]: Secondary | ICD-10-CM | POA: Insufficient documentation

## 2012-09-21 DIAGNOSIS — C61 Malignant neoplasm of prostate: Secondary | ICD-10-CM | POA: Insufficient documentation

## 2012-09-21 DIAGNOSIS — Z981 Arthrodesis status: Secondary | ICD-10-CM | POA: Insufficient documentation

## 2012-09-21 MED ORDER — TECHNETIUM TC 99M MEDRONATE IV KIT
25.0000 | PACK | Freq: Once | INTRAVENOUS | Status: AC | PRN
  Administered 2012-09-21: 25 via INTRAVENOUS

## 2012-09-22 ENCOUNTER — Telehealth: Payer: Self-pay | Admitting: *Deleted

## 2012-09-22 NOTE — Telephone Encounter (Signed)
Called patient and informed him per Dr Truett Perna that Bone Scan negative for osseous disease.

## 2012-09-22 NOTE — Telephone Encounter (Signed)
Message copied by Laroy Apple E on Thu Sep 22, 2012  4:11 PM ------      Message from: [place], [place] P      Created: Thu Sep 22, 2012  3:43 PM                   ----- Message -----         From: Ladene Artist, MD         Sent: 09/21/2012   8:59 PM           To: Wandalee Ferdinand, RN, Glori Luis, RN, #            Please call patient, negative for metastatic disease ------

## 2012-09-22 NOTE — Telephone Encounter (Signed)
Message copied by Laroy Apple E on Thu Sep 22, 2012  4:05 PM ------      Message from: [place], [place] P      Created: Thu Sep 22, 2012  3:43 PM                   ----- Message -----         From: Ladene Artist, MD         Sent: 09/21/2012   8:59 PM           To: Wandalee Ferdinand, RN, Glori Luis, RN, #            Please call patient, negative for metastatic disease ------

## 2012-10-14 ENCOUNTER — Emergency Department (HOSPITAL_BASED_OUTPATIENT_CLINIC_OR_DEPARTMENT_OTHER): Payer: Medicare Other

## 2012-10-14 ENCOUNTER — Emergency Department (HOSPITAL_BASED_OUTPATIENT_CLINIC_OR_DEPARTMENT_OTHER)
Admission: EM | Admit: 2012-10-14 | Discharge: 2012-10-14 | Disposition: A | Discharge status: Home / Self Care | Payer: Medicare Other | Attending: Emergency Medicine | Admitting: Emergency Medicine

## 2012-10-14 ENCOUNTER — Encounter (HOSPITAL_BASED_OUTPATIENT_CLINIC_OR_DEPARTMENT_OTHER): Payer: Self-pay

## 2012-10-14 DIAGNOSIS — Z8546 Personal history of malignant neoplasm of prostate: Secondary | ICD-10-CM | POA: Insufficient documentation

## 2012-10-14 DIAGNOSIS — M542 Cervicalgia: Secondary | ICD-10-CM | POA: Diagnosis not present

## 2012-10-14 DIAGNOSIS — Z23 Encounter for immunization: Secondary | ICD-10-CM | POA: Diagnosis not present

## 2012-10-14 DIAGNOSIS — S199XXA Unspecified injury of neck, initial encounter: Secondary | ICD-10-CM | POA: Diagnosis not present

## 2012-10-14 DIAGNOSIS — S139XXA Sprain of joints and ligaments of unspecified parts of neck, initial encounter: Secondary | ICD-10-CM | POA: Diagnosis not present

## 2012-10-14 DIAGNOSIS — W19XXXA Unspecified fall, initial encounter: Secondary | ICD-10-CM

## 2012-10-14 DIAGNOSIS — Z7902 Long term (current) use of antithrombotics/antiplatelets: Secondary | ICD-10-CM | POA: Insufficient documentation

## 2012-10-14 DIAGNOSIS — Z8669 Personal history of other diseases of the nervous system and sense organs: Secondary | ICD-10-CM | POA: Insufficient documentation

## 2012-10-14 DIAGNOSIS — S0993XA Unspecified injury of face, initial encounter: Secondary | ICD-10-CM | POA: Diagnosis not present

## 2012-10-14 DIAGNOSIS — W1809XA Striking against other object with subsequent fall, initial encounter: Secondary | ICD-10-CM | POA: Insufficient documentation

## 2012-10-14 DIAGNOSIS — S161XXA Strain of muscle, fascia and tendon at neck level, initial encounter: Secondary | ICD-10-CM

## 2012-10-14 DIAGNOSIS — S0190XA Unspecified open wound of unspecified part of head, initial encounter: Secondary | ICD-10-CM | POA: Diagnosis not present

## 2012-10-14 DIAGNOSIS — Z7982 Long term (current) use of aspirin: Secondary | ICD-10-CM | POA: Diagnosis not present

## 2012-10-14 DIAGNOSIS — S0101XA Laceration without foreign body of scalp, initial encounter: Secondary | ICD-10-CM

## 2012-10-14 DIAGNOSIS — Z79899 Other long term (current) drug therapy: Secondary | ICD-10-CM | POA: Diagnosis not present

## 2012-10-14 DIAGNOSIS — S0990XA Unspecified injury of head, initial encounter: Secondary | ICD-10-CM | POA: Insufficient documentation

## 2012-10-14 DIAGNOSIS — Y9389 Activity, other specified: Secondary | ICD-10-CM | POA: Insufficient documentation

## 2012-10-14 DIAGNOSIS — S0100XA Unspecified open wound of scalp, initial encounter: Secondary | ICD-10-CM | POA: Insufficient documentation

## 2012-10-14 DIAGNOSIS — Y92009 Unspecified place in unspecified non-institutional (private) residence as the place of occurrence of the external cause: Secondary | ICD-10-CM | POA: Insufficient documentation

## 2012-10-14 HISTORY — DX: Polyneuropathy, unspecified: G62.9

## 2012-10-14 MED ORDER — TETANUS-DIPHTH-ACELL PERTUSSIS 5-2.5-18.5 LF-MCG/0.5 IM SUSP
0.5000 mL | Freq: Once | INTRAMUSCULAR | Status: AC
  Administered 2012-10-14: 0.5 mL via INTRAMUSCULAR
  Filled 2012-10-14: qty 0.5

## 2012-10-14 MED ORDER — HYDROCODONE-ACETAMINOPHEN 5-325 MG PO TABS
1.0000 | ORAL_TABLET | Freq: Once | ORAL | Status: AC
  Administered 2012-10-14: 1 via ORAL
  Filled 2012-10-14: qty 1

## 2012-10-14 NOTE — ED Provider Notes (Signed)
History     CSN: [id number]  Arrival date & time 10/14/12  1310   First MD Initiated Contact with Patient 10/14/12 1329      Chief Complaint  Patient presents with  . Fall  . Head Laceration  . Neck Pain    (Consider location/radiation/quality/duration/timing/severity/associated sxs/prior treatment) HPI Pt presents with c/o fall last night. He states he was getting back into bed, his room was dark and he missed the bed.  Fell onto the floor, hit the right side of his head.  Today c/o neck pain as well. Had bleeding from right scalp.  Pt is on plavix.  Denies LOC, no chest pain/palpitations or sob preceding fall.  Sounds mechanical in nature.  Following fall had no LOC, no seizure activity, no hheadache, no vomiting.  Pain in neck is worse on right side- worse with movement and palpation.  Pt states his family was concerned and wanted him to be checked out.  There are no other associated systemic symptoms, there are no other alleviating or modifying factors.   Past Medical History  Diagnosis Date  . Cancer   . Prostate cancer   . Neuropathy     Past Surgical History  Procedure Laterality Date  . Coronary artery bypass graft    . Exploratory laparotomy    . Endarterectomy    . Rotator cuff repair    . Lumbar fusion    . Coronary angioplasty with stent placement    . Pacemaker insertion      No family history on file.  History  Substance Use Topics  . Smoking status: Never Smoker   . Smokeless tobacco: Not on file  . Alcohol Use: No      Review of Systems ROS reviewed and all otherwise negative except for mentioned in HPI  Allergies  Penicillins  Home Medications   Current Outpatient Rx  Name  Route  Sig  Dispense  Refill  . aspirin 325 MG tablet   Oral   Take 325 mg by mouth daily.           . ATORVASTATIN CALCIUM PO   Oral   Take by mouth daily. Unsure of dose         . b complex vitamins tablet   Oral   Take 1 tablet by mouth daily.           . carbidopa-levodopa (SINEMET IR) 25-100 MG per tablet   Oral   Take 1 tablet by mouth 3 (three) times daily.         . citalopram (CELEXA) 20 MG tablet   Oral   Take 20 mg by mouth daily.           . clopidogrel (PLAVIX) 75 MG tablet   Oral   Take 75 mg by mouth daily.           Marland Kitchen docusate sodium (COLACE) 100 MG capsule   Oral   Take 100 mg by mouth 2 (two) times daily.           . furosemide (LASIX) 40 MG tablet   Oral   Take 40 mg by mouth daily. Takes 40 mg alternates with 80mg          . gabapentin (NEURONTIN) 300 MG capsule   Oral   Take 300 mg by mouth daily.          Marland Kitchen HYDROcodone-acetaminophen (NORCO/VICODIN) 5-325 MG per tablet   Oral   Take 1 tablet by mouth as needed.         Marland Kitchen  isosorbide mononitrate (IMDUR) 60 MG 24 hr tablet   Oral   Take 30 mg by mouth daily.         . Nutritional Supplements (MELATONIN PO)   Oral   Take 1 tablet by mouth at bedtime.           Marland Kitchen UBIQUINOL PO   Oral   Take 1 tablet by mouth daily.           BP 136/67  Pulse 65  Temp(Src) 98.1 F (36.7 C) (Oral)  Resp 18  Ht 5\' 11"  (1.803 m)  Wt 211 lb (95.709 kg)  BMI 29.44 kg/m2  SpO2 95% Vitals reviewed Physical Exam Physical Examination: General appearance - alert, well appearing, and in no distress Mental status - alert, oriented to person, place, and time Eyes - pupils equal and reactive, extraocular eye movements intact Mouth - mucous membranes moist, pharynx normal without lesions Neck - mild midline cervical spine tenderness, ttp over right paraspinous muscles Chest - clear to auscultation, no wheezes, rales or rhonchi, symmetric air entry Heart - normal rate, regular rhythm, normal S1, S2, no murmurs, rubs, clicks or gallops Abdomen - soft, nontender, nondistended, no masses or organomegaly Back exam - no midline tenderness to palpation, no CVA tenderness Neurological - alert, oriented, normal speech, cranial nerves grossly intact, strength 5/5  in extremities x 4, sensation intact Musculoskeletal - no joint tenderness, deformity or swelling Extremities - peripheral pulses normal, no pedal edema, no clubbing or cyanosis Skin - normal coloration and turgor, no rashes  ED Course  Procedures (including critical care time)  Labs Reviewed - No data to display Dg Cervical Spine Complete  10/14/2012  *RADIOLOGY REPORT*  Clinical Data: 79 year old male status post fall.  Head laceration and neck pain.  CERVICAL SPINE - COMPLETE 4+ VIEW  Comparison: Neck CT 09/19/2007.  Findings: Chronic surgical clips in the right neck and left supraclavicular region.  Stable cervical vertebral height and alignment. Cervicothoracic junction alignment is within normal limits.  C1-C2 alignment and odontoid within normal limits.  AP alignment and lung apices within normal limits.  Left chest cardiac pacemaker re-identified. Bilateral posterior element alignment is within normal limits.  IMPRESSION: No acute fracture or listhesis identified in the cervical spine. Ligamentous injury is not excluded.   Original Report Authenticated By: Erskine Speed, M.D.    Ct Head Wo Contrast  10/14/2012  *RADIOLOGY REPORT*  Clinical Data: Fall, neck pain, head laceration  CT HEAD WITHOUT CONTRAST  Technique:  Contiguous axial images were obtained from the base of the skull through the vertex without contrast.  Comparison: None.  Findings: No skull fracture is noted.  Paranasal sinuses and mastoid air cells are unremarkable.  No intracranial hemorrhage, mass effect or midline shift.  Mild cerebral atrophy. Periventricular and patchy subcortical white matter decreased attenuation probable due to chronic small vessel ischemic changes. No acute infarction.  No mass lesion is noted on this unenhanced scan.  IMPRESSION:  1.  No acute intracranial abnormality.  Mild cerebral atrophy. Periventricular and patchy subcortical white matter decreased attenuation probable due to chronic small vessel  ischemic changes. No definite acute cortical infarction.   Original Report Authenticated By: Natasha Mead, M.D.      1. Head injury, initial encounter   2. Laceration of scalp, initial encounter   3. Fall, initial encounter   4. Cervical strain, initial encounter       MDM  Pt presenting with c/o fall, resultant head injury, and right sided  neck pain.  Head ct and cervical spine xrays reassuring.  Pt declines to have wound irrigated to see if staples are needed- tetanus updated.  Discharged with strict return precautions.  Pt agreeable with plan.        Ethelda Chick, MD 10/17/12 [phone]

## 2012-10-14 NOTE — ED Notes (Signed)
Patient transported to CT 

## 2012-10-17 DIAGNOSIS — C61 Malignant neoplasm of prostate: Secondary | ICD-10-CM | POA: Diagnosis not present

## 2012-10-18 ENCOUNTER — Other Ambulatory Visit (HOSPITAL_BASED_OUTPATIENT_CLINIC_OR_DEPARTMENT_OTHER): Payer: Medicare Other | Admitting: Lab

## 2012-10-18 ENCOUNTER — Ambulatory Visit (HOSPITAL_BASED_OUTPATIENT_CLINIC_OR_DEPARTMENT_OTHER): Payer: Medicare Other | Admitting: Oncology

## 2012-10-18 ENCOUNTER — Telehealth: Payer: Self-pay | Admitting: Oncology

## 2012-10-18 VITALS — BP 150/70 | HR 64 | Temp 97.2°F | Resp 20 | Ht 71.0 in | Wt 217.0 lb

## 2012-10-18 DIAGNOSIS — C8589 Other specified types of non-Hodgkin lymphoma, extranodal and solid organ sites: Secondary | ICD-10-CM

## 2012-10-18 DIAGNOSIS — D696 Thrombocytopenia, unspecified: Secondary | ICD-10-CM | POA: Diagnosis not present

## 2012-10-18 DIAGNOSIS — C61 Malignant neoplasm of prostate: Secondary | ICD-10-CM | POA: Diagnosis not present

## 2012-10-18 DIAGNOSIS — M25559 Pain in unspecified hip: Secondary | ICD-10-CM | POA: Diagnosis not present

## 2012-10-18 DIAGNOSIS — D649 Anemia, unspecified: Secondary | ICD-10-CM

## 2012-10-18 LAB — CBC WITH DIFFERENTIAL/PLATELET
BASO%: 0.4 % (ref 0.0–2.0)
EOS%: 3.3 % (ref 0.0–7.0)
HCT: 35 % — ABNORMAL LOW (ref 38.4–49.9)
LYMPH%: 26.5 % (ref 14.0–49.0)
MCH: 31.4 pg (ref 27.2–33.4)
MCHC: 34.3 g/dL (ref 32.0–36.0)
MONO#: 0.6 10*3/uL (ref 0.1–0.9)
MONO%: 7.7 % (ref 0.0–14.0)
NEUT%: 62.1 % (ref 39.0–75.0)
Platelets: 127 10*3/uL — ABNORMAL LOW (ref 140–400)
RBC: 3.83 10*6/uL — ABNORMAL LOW (ref 4.20–5.82)
WBC: 8 10*3/uL (ref 4.0–10.3)

## 2012-10-18 LAB — PSA: PSA: 5.8 ng/mL — ABNORMAL HIGH (ref <=4.00)

## 2012-10-18 NOTE — Progress Notes (Signed)
   [facility]    OFFICE PROGRESS NOTE   INTERVAL HISTORY:   He returns as scheduled. He fell while getting into bed on 10/14/2012 and suffered a scalp laceration. He was seen in the emergency room.  Edward Woodward reports intermittent pain at the low back and right hip. The pain improves with ambulation. The pain is relieved with ibuprofen. A bone scan on 09/21/2012 revealed changes of degenerative disease in the spine. No evidence of osseous metastatic disease.    Objective:  Vital signs in last 24 hours:  Blood pressure 150/70, pulse 64, temperature 97.2 F (36.2 C), temperature source Oral, resp. rate 20, height 5\' 11"  (1.803 m), weight 217 lb (98.431 kg).    HEENT: Neck without mass Lymphatics: No cervical, supraclavicular, axillary, or inguinal nodes Resp: Lungs clear bilaterally Cardio: Regular rate and rhythm GI: No hepatosplenomegaly Vascular: No leg edema Musculoskeletal: No tenderness at the spine or iliac    Portacath/PICC-without erythema  Lab Results:  Lab Results  Component Value Date   WBC 8.0 10/18/2012   HGB 12.0* 10/18/2012   HCT 35.0* 10/18/2012   MCV 91.5 10/18/2012   PLT 127* 10/18/2012   ANC 5.0 PSA on 09/07/2012-5.45   Medications: I have reviewed the patient's current medications.  Assessment/Plan: 1. Non-Hodgkin's lymphoma (follicular grade 3 lymphoma) - status post 6 cycles of Cytoxan/prednisone/rituximab 07/01/2007 through 10/21/2007. He remains in clinical remission. 2. Prostate cancer - maintained on intermittent hormonal therapy. the PSA was higher in the fall of 2013. Hormonal therapy was discontinued. The PSA was stable on 09/07/2012. 3. History of a normocytic anemia secondary to non-Hodgkin's lymphoma, chemotherapy, and renal insufficiency - stable  4. Mild thrombocytopenia - chronic. 5. History of bilateral hydronephrosis. 6. Coronary artery disease - status post coronary artery stent placement. 7. History of  noncalcified lung nodules. 8. History of severe neutropenia July 2009 - likely related to delayed toxicity from rituximab. 9. Macular degeneration. 10. Right middle lobe density on a CT of the chest 06/18/2010 measuring 2.5 x 1.6 cm with mildly increased metabolic activity (SUV max 2.5) on a PET scan 06/27/2010. The area of increased density was less confluent and appeared smaller on a restaging CT 09/08/2010. Right middle lobe "scarring "with no new or suspicious pulmonary nodule on a CT 08/04/2011. 11. Mild increased metabolic activity associated with several lymph nodes on the PET scan 06/27/2010 - likely related to lymphoma. No lymphadenopathy in the mediastinum or axillary areas on the CT 08/04/2011. 12. Right hip discomfort-he received a steroid injection by his primary physician without improvement. He saw Dr. Leslee Home and there was a question of a lytic process in the right pelvis. A bone scan on 09/21/2012 revealed no evidence of metastatic disease.  Disposition:  He remains in clinical remission from the non-Hodgkin's lymphoma. Edward Woodward has hormone refractory prostate cancer. The PSA is stable and a bone scan revealed no evidence of metastatic disease. He is comfortable with an observation approach. I suspect the pain in the low back and right hip is related to degenerative spine disease. He will contact us for increased or consistent pain. Edward Woodward will return for an office visit in 3 months. He is scheduled to see Dr. Earlene Plater next week.   Edward Papas, MD  10/18/2012  2:14 PM

## 2012-10-18 NOTE — Telephone Encounter (Signed)
Gave pt appt for June 2014 lab and MD , cancelled April 2014 appt MD only

## 2012-10-24 DIAGNOSIS — R972 Elevated prostate specific antigen [PSA]: Secondary | ICD-10-CM | POA: Diagnosis not present

## 2012-10-24 DIAGNOSIS — C61 Malignant neoplasm of prostate: Secondary | ICD-10-CM | POA: Diagnosis not present

## 2012-10-26 DIAGNOSIS — C61 Malignant neoplasm of prostate: Secondary | ICD-10-CM | POA: Diagnosis not present

## 2012-10-31 ENCOUNTER — Ambulatory Visit: Payer: Medicare Other | Admitting: Oncology

## 2012-11-22 DIAGNOSIS — H353290 Exudative age-related macular degeneration, unspecified eye, stage unspecified: Secondary | ICD-10-CM | POA: Diagnosis not present

## 2012-11-22 DIAGNOSIS — Z961 Presence of intraocular lens: Secondary | ICD-10-CM | POA: Diagnosis not present

## 2012-11-22 DIAGNOSIS — H35359 Cystoid macular degeneration, unspecified eye: Secondary | ICD-10-CM | POA: Diagnosis not present

## 2012-11-22 DIAGNOSIS — H43819 Vitreous degeneration, unspecified eye: Secondary | ICD-10-CM | POA: Diagnosis not present

## 2012-11-28 DIAGNOSIS — M25559 Pain in unspecified hip: Secondary | ICD-10-CM | POA: Diagnosis not present

## 2012-12-14 DIAGNOSIS — R29898 Other symptoms and signs involving the musculoskeletal system: Secondary | ICD-10-CM | POA: Diagnosis not present

## 2012-12-14 DIAGNOSIS — I129 Hypertensive chronic kidney disease with stage 1 through stage 4 chronic kidney disease, or unspecified chronic kidney disease: Secondary | ICD-10-CM | POA: Diagnosis not present

## 2012-12-14 DIAGNOSIS — M79609 Pain in unspecified limb: Secondary | ICD-10-CM | POA: Diagnosis not present

## 2012-12-24 DIAGNOSIS — H26499 Other secondary cataract, unspecified eye: Secondary | ICD-10-CM | POA: Diagnosis not present

## 2012-12-24 DIAGNOSIS — H353190 Nonexudative age-related macular degeneration, unspecified eye, stage unspecified: Secondary | ICD-10-CM | POA: Diagnosis not present

## 2013-01-23 ENCOUNTER — Other Ambulatory Visit (HOSPITAL_BASED_OUTPATIENT_CLINIC_OR_DEPARTMENT_OTHER): Payer: Medicare Other | Admitting: Lab

## 2013-01-23 ENCOUNTER — Ambulatory Visit (HOSPITAL_BASED_OUTPATIENT_CLINIC_OR_DEPARTMENT_OTHER): Payer: Medicare Other | Admitting: Oncology

## 2013-01-23 ENCOUNTER — Telehealth: Payer: Self-pay | Admitting: Oncology

## 2013-01-23 VITALS — BP 144/75 | HR 70 | Temp 98.7°F | Resp 20 | Ht 71.0 in | Wt 221.2 lb

## 2013-01-23 DIAGNOSIS — D649 Anemia, unspecified: Secondary | ICD-10-CM

## 2013-01-23 DIAGNOSIS — C8589 Other specified types of non-Hodgkin lymphoma, extranodal and solid organ sites: Secondary | ICD-10-CM

## 2013-01-23 DIAGNOSIS — C61 Malignant neoplasm of prostate: Secondary | ICD-10-CM

## 2013-01-23 LAB — BASIC METABOLIC PANEL (CC13)
Calcium: 9.8 mg/dL (ref 8.4–10.4)
Glucose: 97 mg/dl (ref 70–140)
Potassium: 4 mEq/L (ref 3.5–5.1)
Sodium: 139 mEq/L (ref 136–145)

## 2013-01-23 LAB — CBC WITH DIFFERENTIAL/PLATELET
BASO%: 0.4 % (ref 0.0–2.0)
Basophils Absolute: 0 10*3/uL (ref 0.0–0.1)
EOS%: 2.3 % (ref 0.0–7.0)
HGB: 11.2 g/dL — ABNORMAL LOW (ref 13.0–17.1)
MCH: 31.4 pg (ref 27.2–33.4)
MCHC: 34.4 g/dL (ref 32.0–36.0)
MCV: 91.4 fL (ref 79.3–98.0)
MONO%: 7.6 % (ref 0.0–14.0)
RBC: 3.57 10*6/uL — ABNORMAL LOW (ref 4.20–5.82)
RDW: 14.8 % — ABNORMAL HIGH (ref 11.0–14.6)
lymph#: 2.1 10*3/uL (ref 0.9–3.3)

## 2013-01-23 NOTE — Telephone Encounter (Signed)
Gave pt appt for lab and MD on November 2014 , date per pt rqst

## 2013-01-23 NOTE — Progress Notes (Signed)
   [facility]    OFFICE PROGRESS NOTE   INTERVAL HISTORY:   He returns as scheduled. He complains of increased pain in the legs. This has partially improved with an increased dose of Neurontin.  Objective:  Vital signs in last 24 hours:  Blood pressure 144/75, pulse 70, temperature 98.7 F (37.1 C), temperature source Oral, resp. rate 20, height 5\' 11"  (1.803 m), weight 221 lb 3.2 oz (100.336 kg).    HEENT: neck without mass Lymphatics: no cervical, supraclavicular, axillary, or inguinal nodes Resp: lungs clear bilaterally Cardio: regular rate and rhythm GI: no hepatosplenomegaly Vascular: trace pretibial edema bilaterally   Lab Results:  Lab Results  Component Value Date   WBC 8.9 01/23/2013   HGB 11.2* 01/23/2013   HCT 32.7* 01/23/2013   MCV 91.4 01/23/2013   PLT 141 01/23/2013  ANC 5.8 Potassium 4.0, creatinine 1.3 PSA pending  Medications: I have reviewed the patient's current medications.  Assessment/Plan: 1. Non-Hodgkin's lymphoma (follicular grade 3 lymphoma) - status post 6 cycles of Cytoxan/prednisone/rituximab 07/01/2007 through 10/21/2007. He remains in clinical remission. 2. Prostate cancer - maintained on intermittent hormonal therapy. the PSA was higher in the fall of 2013. Hormonal therapy was discontinued. The PSA was stable on 10/18/2012. 3. History of a normocytic anemia secondary to non-Hodgkin's lymphoma, chemotherapy, and renal insufficiency - stable  4. Mild thrombocytopenia - chronic. 5. History of bilateral hydronephrosis. 6. Coronary artery disease - status post coronary artery stent placement. 7. History of noncalcified lung nodules. 8. History of severe neutropenia July 2009 - likely related to delayed toxicity from rituximab. 9. Macular degeneration. 10. Right middle lobe density on a CT of the chest 06/18/2010 measuring 2.5 x 1.6 cm with mildly increased metabolic activity (SUV max 2.5) on a PET scan 06/27/2010. The area of  increased density was less confluent and appeared smaller on a restaging CT 09/08/2010. Right middle lobe "scarring "with no new or suspicious pulmonary nodule on a CT 08/04/2011. 11. Mild increased metabolic activity associated with several lymph nodes on the PET scan 06/27/2010 - likely related to lymphoma. No lymphadenopathy in the mediastinum or axillary areas on the CT 08/04/2011. 12. Right hip discomfort-he received a steroid injection by his primary physician without improvement. He saw Dr. Leslee Home and there was a question of a lytic process in the right pelvis. A bone scan on 09/21/2012 revealed no evidence of metastatic disease.   Disposition:  Edward Woodward remains in clinical remission from non-Hodgkin's lymphoma. He appears asymptomatic from prostate cancer. We will followup on the PSA from today. He is scheduled to see Dr. Earlene Plater next week. Edward Woodward will return for an office and lab visit in 4 months.  The leg pain is most likely related to neuropathy and vascular disease.   Thornton Papas, MD  01/23/2013  7:28 PM

## 2013-01-25 ENCOUNTER — Telehealth: Payer: Self-pay | Admitting: *Deleted

## 2013-01-25 ENCOUNTER — Encounter: Payer: Self-pay | Admitting: *Deleted

## 2013-01-25 NOTE — Telephone Encounter (Signed)
Called pt with lab results. He voiced understanding.

## 2013-01-25 NOTE — Telephone Encounter (Signed)
Message copied by Caleb Popp on Wed Jan 25, 2013 11:51 AM ------      Message from: Ladene Artist      Created: Tue Jan 24, 2013  8:18 AM       Please call patient, stable,please call him ------

## 2013-02-23 ENCOUNTER — Other Ambulatory Visit: Payer: Self-pay | Admitting: Dermatology

## 2013-02-23 DIAGNOSIS — L821 Other seborrheic keratosis: Secondary | ICD-10-CM | POA: Diagnosis not present

## 2013-02-23 DIAGNOSIS — D485 Neoplasm of uncertain behavior of skin: Secondary | ICD-10-CM | POA: Diagnosis not present

## 2013-02-23 DIAGNOSIS — L259 Unspecified contact dermatitis, unspecified cause: Secondary | ICD-10-CM | POA: Diagnosis not present

## 2013-02-23 DIAGNOSIS — L820 Inflamed seborrheic keratosis: Secondary | ICD-10-CM | POA: Diagnosis not present

## 2013-02-23 DIAGNOSIS — L570 Actinic keratosis: Secondary | ICD-10-CM | POA: Diagnosis not present

## 2013-02-23 DIAGNOSIS — Z85828 Personal history of other malignant neoplasm of skin: Secondary | ICD-10-CM | POA: Diagnosis not present

## 2013-03-01 ENCOUNTER — Other Ambulatory Visit: Payer: Self-pay

## 2013-03-21 DIAGNOSIS — E7800 Pure hypercholesterolemia, unspecified: Secondary | ICD-10-CM | POA: Diagnosis not present

## 2013-03-21 DIAGNOSIS — I129 Hypertensive chronic kidney disease with stage 1 through stage 4 chronic kidney disease, or unspecified chronic kidney disease: Secondary | ICD-10-CM | POA: Diagnosis not present

## 2013-03-21 DIAGNOSIS — Z79899 Other long term (current) drug therapy: Secondary | ICD-10-CM | POA: Diagnosis not present

## 2013-03-21 DIAGNOSIS — G479 Sleep disorder, unspecified: Secondary | ICD-10-CM | POA: Diagnosis not present

## 2013-05-01 DIAGNOSIS — R972 Elevated prostate specific antigen [PSA]: Secondary | ICD-10-CM | POA: Diagnosis not present

## 2013-05-01 DIAGNOSIS — C61 Malignant neoplasm of prostate: Secondary | ICD-10-CM | POA: Diagnosis not present

## 2013-05-09 DIAGNOSIS — Z23 Encounter for immunization: Secondary | ICD-10-CM | POA: Diagnosis not present

## 2013-06-01 ENCOUNTER — Other Ambulatory Visit: Payer: Self-pay

## 2013-06-02 ENCOUNTER — Telehealth: Payer: Self-pay | Admitting: Oncology

## 2013-06-02 ENCOUNTER — Ambulatory Visit (HOSPITAL_BASED_OUTPATIENT_CLINIC_OR_DEPARTMENT_OTHER): Payer: Medicare Other | Admitting: Oncology

## 2013-06-02 ENCOUNTER — Other Ambulatory Visit (HOSPITAL_BASED_OUTPATIENT_CLINIC_OR_DEPARTMENT_OTHER): Payer: Medicare Other | Admitting: Lab

## 2013-06-02 VITALS — BP 147/57 | HR 65 | Temp 97.6°F | Resp 20 | Ht 71.0 in | Wt 224.0 lb

## 2013-06-02 DIAGNOSIS — C8589 Other specified types of non-Hodgkin lymphoma, extranodal and solid organ sites: Secondary | ICD-10-CM

## 2013-06-02 DIAGNOSIS — D649 Anemia, unspecified: Secondary | ICD-10-CM

## 2013-06-02 DIAGNOSIS — M79609 Pain in unspecified limb: Secondary | ICD-10-CM

## 2013-06-02 DIAGNOSIS — C61 Malignant neoplasm of prostate: Secondary | ICD-10-CM

## 2013-06-02 DIAGNOSIS — C8299 Follicular lymphoma, unspecified, extranodal and solid organ sites: Secondary | ICD-10-CM | POA: Diagnosis not present

## 2013-06-02 DIAGNOSIS — D6481 Anemia due to antineoplastic chemotherapy: Secondary | ICD-10-CM | POA: Diagnosis not present

## 2013-06-02 DIAGNOSIS — D696 Thrombocytopenia, unspecified: Secondary | ICD-10-CM

## 2013-06-02 DIAGNOSIS — M545 Low back pain, unspecified: Secondary | ICD-10-CM | POA: Diagnosis not present

## 2013-06-02 DIAGNOSIS — R911 Solitary pulmonary nodule: Secondary | ICD-10-CM | POA: Diagnosis not present

## 2013-06-02 DIAGNOSIS — N289 Disorder of kidney and ureter, unspecified: Secondary | ICD-10-CM | POA: Diagnosis not present

## 2013-06-02 LAB — CBC WITH DIFFERENTIAL/PLATELET
BASO%: 0.5 % (ref 0.0–2.0)
Eosinophils Absolute: 0.3 10*3/uL (ref 0.0–0.5)
HCT: 34.7 % — ABNORMAL LOW (ref 38.4–49.9)
MCHC: 33.6 g/dL (ref 32.0–36.0)
MONO#: 0.5 10*3/uL (ref 0.1–0.9)
NEUT#: 4.2 10*3/uL (ref 1.5–6.5)
NEUT%: 52.2 % (ref 39.0–75.0)
RBC: 3.79 10*6/uL — ABNORMAL LOW (ref 4.20–5.82)
WBC: 8.1 10*3/uL (ref 4.0–10.3)
lymph#: 3 10*3/uL (ref 0.9–3.3)

## 2013-06-02 LAB — BASIC METABOLIC PANEL (CC13)
CO2: 25 mEq/L (ref 22–29)
Chloride: 106 mEq/L (ref 98–109)
Glucose: 112 mg/dl (ref 70–140)
Sodium: 142 mEq/L (ref 136–145)

## 2013-06-02 NOTE — Progress Notes (Signed)
   [facility]    OFFICE PROGRESS NOTE   INTERVAL HISTORY:   He returns for scheduled visit. He reports receiving a Lupron injection by Dr. Earlene Plater in October. His chief complaint is pain in the legs related to neuropathy. Chronic low back pain. He has noted a new fullness in the left posterior neck. No fever or night sweats.  Objective:  Vital signs in last 24 hours:  Blood pressure 147/57, pulse 65, temperature 97.6 F (36.4 C), temperature source Oral, resp. rate 20, height 5\' 11"  (1.803 m), weight 224 lb (101.606 kg).    HEENT: Oropharynx without visible mass, at the left low posterior neck and anterior to the trapezius musculature there is a 1-1.5 cm rounded fullness. This is deep in the cutaneous tissue overlying the deep neck musculature Lymphatics: No cervical, supraclavicular, axillary, or inguinal nodes Resp: Bilateral and inspiratory rhonchi at the bases, no respiratory distress Cardio: Regular rate and rhythm GI: No hepatomegaly Vascular: Trace pitting edema at the low leg and ankle bilaterally on the left greater than right   Lab Results:  Lab Results  Component Value Date   WBC 8.1 06/02/2013   HGB 11.7* 06/02/2013   HCT 34.7* 06/02/2013   MCV 91.6 06/02/2013   PLT 144 06/02/2013   ANC 4.2  Potassium 3.9, creatinine 1.3  PSA on 01/23/2013-7.18    Medications: I have reviewed the patient's current medications.  Assessment/Plan: 1. Non-Hodgkin's lymphoma (follicular grade 3 lymphoma) - status post 6 cycles of Cytoxan/prednisone/rituximab 07/01/2007 through 10/21/2007. He remains in clinical remission. 2. Prostate cancer - maintained on intermittent hormonal therapy. the PSA was higher in the fall of 2013. He continues hormonal therapy with Dr. Earlene Plater.  3. History of a normocytic anemia secondary to non-Hodgkin's lymphoma, chemotherapy, and renal insufficiency - stable  4. Mild thrombocytopenia - chronic. 5. History of bilateral  hydronephrosis. 6. Coronary artery disease - status post coronary artery stent placement. 7. History of noncalcified lung nodules. 8. History of severe neutropenia July 2009 - likely related to delayed toxicity from rituximab. 9. Macular degeneration. 10. Right middle lobe density on a CT of the chest 06/18/2010 measuring 2.5 x 1.6 cm with mildly increased metabolic activity (SUV max 2.5) on a PET scan 06/27/2010. The area of increased density was less confluent and appeared smaller on a restaging CT 09/08/2010. Right middle lobe "scarring "with no new or suspicious pulmonary nodule on a CT 08/04/2011. 11. Mild increased metabolic activity associated with several lymph nodes on the PET scan 06/27/2010 - likely related to lymphoma. No lymphadenopathy in the mediastinum or axillary areas on the CT 08/04/2011. 12. Right hip discomfort-he received a steroid injection by his primary physician without improvement. He saw Dr. Leslee Home and there was a question of a lytic process in the right pelvis. A bone scan on 09/21/2012 revealed no evidence of metastatic disease. He did not complain of hip pain today 13. Rounded fullness in the left low posterior neck-? Cyst,? Lymphadenopathy  Disposition:  His overall status appears unchanged. The fullness in the left lower neck could be related to a cyst or lymphoma. This could less likely represent metastatic prostate cancer or another malignancy. Mr. Kozar and his wife will monitor this area and contact us if he notes growth. He will return for an office and lab visit in 4 months. We will followup on the PSA from today.   Thornton Papas, MD  06/02/2013  9:44 AM

## 2013-06-02 NOTE — Telephone Encounter (Signed)
gv and printed appt sched and avs forpt for March 2015  °

## 2013-06-03 ENCOUNTER — Encounter: Payer: Self-pay | Admitting: *Deleted

## 2013-06-03 ENCOUNTER — Encounter: Payer: Self-pay | Admitting: Interventional Cardiology

## 2013-06-07 ENCOUNTER — Encounter: Payer: Self-pay | Admitting: Interventional Cardiology

## 2013-06-07 ENCOUNTER — Ambulatory Visit (INDEPENDENT_AMBULATORY_CARE_PROVIDER_SITE_OTHER): Payer: Medicare Other | Admitting: Interventional Cardiology

## 2013-06-07 VITALS — BP 154/80 | HR 65 | Ht 71.0 in | Wt 222.0 lb

## 2013-06-07 DIAGNOSIS — I25810 Atherosclerosis of coronary artery bypass graft(s) without angina pectoris: Secondary | ICD-10-CM | POA: Insufficient documentation

## 2013-06-07 DIAGNOSIS — E785 Hyperlipidemia, unspecified: Secondary | ICD-10-CM | POA: Insufficient documentation

## 2013-06-07 DIAGNOSIS — I739 Peripheral vascular disease, unspecified: Secondary | ICD-10-CM

## 2013-06-07 DIAGNOSIS — I2510 Atherosclerotic heart disease of native coronary artery without angina pectoris: Secondary | ICD-10-CM

## 2013-06-07 DIAGNOSIS — I10 Essential (primary) hypertension: Secondary | ICD-10-CM

## 2013-06-07 NOTE — Patient Instructions (Signed)
Your physician recommends that you continue on your current medications as directed. Please refer to the Current Medication list given to you today.  Your physician has requested that you have a lower extremity arterial exercise duplex. During this test, exercise and ultrasound are used to evaluate arterial blood flow in the legs. Allow one hour for this exam. There are no restrictions or special instructions.  Your physician wants you to follow-up in: 8-12 months You will receive a reminder letter in the mail two months in advance. If you don't receive a letter, please call our office to schedule the follow-up appointment.

## 2013-06-07 NOTE — Progress Notes (Signed)
Patient ID: TAJAI IHDE, male   DOB: 1927/05/31, 79 y.o.   MRN: [id number]    [address] [place], [place]  [address] Phone: [phone] Fax:  [phone]  Date:  06/07/2013   ID:  SAMWISE ECKARDT, DOB 05-03-1927, MRN [id number]  PCP:  Ginette Otto, MD   ASSESSMENT:  1. Coronary atherosclerotic heart disease, stable without angina 2. Bilateral lower extremity pain on exertion consistent with claudication. Previous documentation of moderate obstructive disease bilateral 2010 3. Hypertension 4 hyperlipidemia  PLAN:  1. Bilateral lower extremity Doppler study 2. Clinical followup for coronary disease in 8-12 months.   SUBJECTIVE: DEO MEHRINGER is a 79 y.o. male who is doing well with the exception of marked limitation and exertion do to bilateral lower extremity pain. He has known history of peripheral vascular disease and also spinal stenosis. He denies angina and dyspnea. He has not had syncope and falls. There is also lower extremity edema but this is been chronically present for several years. Edema has not significantly changed.   Wt Readings from Last 3 Encounters:  06/07/13 222 lb (100.699 kg)  06/02/13 224 lb (101.606 kg)  01/23/13 221 lb 3.2 oz (100.336 kg)     Past Medical History  Diagnosis Date  . Cancer   . Prostate cancer   . Neuropathy   . Hypercholesteremia   . Depressive disorder, not elsewhere classified   . Angina pectoris   . Sinoatrial node dysfunction   . Degeneration of lumbar or lumbosacral intervertebral disc   . Dyspnea   . CAD (coronary artery disease)      NYHA Class 1B, CABG 1985  . HTN (hypertension)     Current Outpatient Prescriptions  Medication Sig Dispense Refill  . aspirin 325 MG tablet Take 325 mg by mouth daily.        . ATORVASTATIN CALCIUM PO Take by mouth every other day. Unsure of dose      . b complex vitamins tablet Take 1 tablet by mouth daily.        . citalopram (CELEXA) 20 MG tablet  Take 20 mg by mouth daily.        . clopidogrel (PLAVIX) 75 MG tablet Take 75 mg by mouth daily.        Marland Kitchen docusate sodium (COLACE) 100 MG capsule Take 100 mg by mouth 2 (two) times daily.        . furosemide (LASIX) 40 MG tablet Take 40 mg by mouth daily. Takes 40 mg alternates with 80mg       . gabapentin (NEURONTIN) 300 MG capsule Take 300 mg by mouth 3 (three) times daily.       . isosorbide mononitrate (IMDUR) 60 MG 24 hr tablet Take 30 mg by mouth daily.      . Nutritional Supplements (MELATONIN PO) Take 1 tablet by mouth at bedtime.        Marland Kitchen UBIQUINOL PO Take 1 tablet by mouth daily. CoQ10      . zolpidem (AMBIEN) 5 MG tablet Take 5 mg by mouth at bedtime as needed.       No current facility-administered medications for this visit.    Allergies:    Allergies  Allergen Reactions  . Penicillins Swelling    Social History:  The patient  reports that he has never smoked. He does not have any smokeless tobacco history on file. He reports that he does not drink alcohol or use illicit drugs.   ROS:  Please see the history of present illness.   Appetite is good. No nitroglycerin use. Denies skin changes and ulcers on legs.   All other systems reviewed and negative.   OBJECTIVE: VS:  BP 154/80  Pulse 65  Ht 5\' 11"  (1.803 m)  Wt 222 lb (100.699 kg)  BMI 30.98 kg/m2  SpO2 95% Well nourished, well developed, in no acute distress, elderly and appears his stated age HEENT: normal Neck: JVD flat. Carotid bruit faint left bruit  Cardiac:  normal S1, S2; RRR; no murmur Lungs:  clear to auscultation bilaterally, no wheezing, rhonchi or rales Abd: soft, nontender, no hepatomegaly Ext: Edema 2+ bilateral ankle edema. Pulses absent pulses Skin: warm and dry Neuro:  CNs 2-12 intact, no focal abnormalities noted  EKG:  Normal sinus rhythm with incomplete right bundle branch block, LVH, nonspecific ST abnormality. Evidence of septal infarct is also noted. No change from chronic baseline        Signed, Darci Needle III, MD 06/07/2013 3:10 PM

## 2013-06-12 ENCOUNTER — Ambulatory Visit (HOSPITAL_COMMUNITY): Payer: Medicare Other | Attending: Cardiology

## 2013-06-12 ENCOUNTER — Encounter: Payer: Self-pay | Admitting: Cardiology

## 2013-06-12 DIAGNOSIS — M25569 Pain in unspecified knee: Secondary | ICD-10-CM | POA: Insufficient documentation

## 2013-06-12 DIAGNOSIS — I2510 Atherosclerotic heart disease of native coronary artery without angina pectoris: Secondary | ICD-10-CM | POA: Diagnosis not present

## 2013-06-12 DIAGNOSIS — I739 Peripheral vascular disease, unspecified: Secondary | ICD-10-CM

## 2013-06-12 DIAGNOSIS — E785 Hyperlipidemia, unspecified: Secondary | ICD-10-CM | POA: Diagnosis not present

## 2013-06-12 DIAGNOSIS — I70209 Unspecified atherosclerosis of native arteries of extremities, unspecified extremity: Secondary | ICD-10-CM | POA: Diagnosis not present

## 2013-06-12 DIAGNOSIS — I70219 Atherosclerosis of native arteries of extremities with intermittent claudication, unspecified extremity: Secondary | ICD-10-CM | POA: Insufficient documentation

## 2013-06-12 DIAGNOSIS — I10 Essential (primary) hypertension: Secondary | ICD-10-CM | POA: Insufficient documentation

## 2013-06-16 ENCOUNTER — Telehealth: Payer: Self-pay

## 2013-06-16 NOTE — Telephone Encounter (Signed)
pt given results of lower extremity dopp.Only mild lower extremity plaque build up and should not be causing symptoms.pt verbalized understanding.

## 2013-06-16 NOTE — Telephone Encounter (Signed)
Message copied by Jarvis Newcomer on Fri Jun 16, 2013  4:33 PM ------      Message from: Verdis Prime      Created: Fri Jun 16, 2013  2:07 PM       Only mild lower extremity plaque build up and should not be causing symptoms. ------

## 2013-07-10 ENCOUNTER — Inpatient Hospital Stay (HOSPITAL_COMMUNITY): Payer: Medicare Other

## 2013-07-10 ENCOUNTER — Ambulatory Visit
Admission: RE | Admit: 2013-07-10 | Discharge: 2013-07-10 | Disposition: A | Discharge status: Home / Self Care | Payer: Medicare Other | Source: Ambulatory Visit | Attending: Interventional Cardiology | Admitting: Interventional Cardiology

## 2013-07-10 ENCOUNTER — Ambulatory Visit (INDEPENDENT_AMBULATORY_CARE_PROVIDER_SITE_OTHER): Payer: Medicare Other | Admitting: Interventional Cardiology

## 2013-07-10 ENCOUNTER — Emergency Department (HOSPITAL_COMMUNITY): Payer: Medicare Other

## 2013-07-10 ENCOUNTER — Encounter (HOSPITAL_COMMUNITY): Payer: Self-pay | Admitting: Emergency Medicine

## 2013-07-10 ENCOUNTER — Telehealth: Payer: Self-pay

## 2013-07-10 ENCOUNTER — Inpatient Hospital Stay (HOSPITAL_COMMUNITY)
Admission: EM | Admit: 2013-07-10 | Discharge: 2013-07-13 | DRG: 200 | Disposition: A | Discharge status: Home Health | Payer: Medicare Other | Attending: Surgery | Admitting: Surgery

## 2013-07-10 ENCOUNTER — Encounter: Payer: Self-pay | Admitting: Interventional Cardiology

## 2013-07-10 ENCOUNTER — Telehealth: Payer: Self-pay | Admitting: Interventional Cardiology

## 2013-07-10 VITALS — BP 150/86 | HR 96 | Ht 70.0 in | Wt 210.0 lb

## 2013-07-10 DIAGNOSIS — J95811 Postprocedural pneumothorax: Secondary | ICD-10-CM | POA: Diagnosis not present

## 2013-07-10 DIAGNOSIS — R0602 Shortness of breath: Secondary | ICD-10-CM | POA: Diagnosis not present

## 2013-07-10 DIAGNOSIS — Z951 Presence of aortocoronary bypass graft: Secondary | ICD-10-CM

## 2013-07-10 DIAGNOSIS — J984 Other disorders of lung: Secondary | ICD-10-CM | POA: Diagnosis not present

## 2013-07-10 DIAGNOSIS — I739 Peripheral vascular disease, unspecified: Secondary | ICD-10-CM | POA: Diagnosis present

## 2013-07-10 DIAGNOSIS — R079 Chest pain, unspecified: Secondary | ICD-10-CM | POA: Diagnosis not present

## 2013-07-10 DIAGNOSIS — J9383 Other pneumothorax: Secondary | ICD-10-CM | POA: Diagnosis not present

## 2013-07-10 DIAGNOSIS — R0609 Other forms of dyspnea: Secondary | ICD-10-CM | POA: Diagnosis not present

## 2013-07-10 DIAGNOSIS — I2510 Atherosclerotic heart disease of native coronary artery without angina pectoris: Secondary | ICD-10-CM

## 2013-07-10 DIAGNOSIS — W19XXXA Unspecified fall, initial encounter: Secondary | ICD-10-CM

## 2013-07-10 DIAGNOSIS — M51379 Other intervertebral disc degeneration, lumbosacral region without mention of lumbar back pain or lower extremity pain: Secondary | ICD-10-CM | POA: Diagnosis present

## 2013-07-10 DIAGNOSIS — Z4682 Encounter for fitting and adjustment of non-vascular catheter: Secondary | ICD-10-CM | POA: Diagnosis not present

## 2013-07-10 DIAGNOSIS — G5790 Unspecified mononeuropathy of unspecified lower limb: Secondary | ICD-10-CM | POA: Diagnosis present

## 2013-07-10 DIAGNOSIS — E7800 Pure hypercholesterolemia, unspecified: Secondary | ICD-10-CM | POA: Diagnosis present

## 2013-07-10 DIAGNOSIS — D62 Acute posthemorrhagic anemia: Secondary | ICD-10-CM | POA: Diagnosis present

## 2013-07-10 DIAGNOSIS — F329 Major depressive disorder, single episode, unspecified: Secondary | ICD-10-CM | POA: Diagnosis present

## 2013-07-10 DIAGNOSIS — D649 Anemia, unspecified: Secondary | ICD-10-CM

## 2013-07-10 DIAGNOSIS — R0600 Dyspnea, unspecified: Secondary | ICD-10-CM

## 2013-07-10 DIAGNOSIS — G4733 Obstructive sleep apnea (adult) (pediatric): Secondary | ICD-10-CM | POA: Diagnosis not present

## 2013-07-10 DIAGNOSIS — S270XXA Traumatic pneumothorax, initial encounter: Secondary | ICD-10-CM

## 2013-07-10 DIAGNOSIS — Z7902 Long term (current) use of antithrombotics/antiplatelets: Secondary | ICD-10-CM | POA: Diagnosis not present

## 2013-07-10 DIAGNOSIS — Z9861 Coronary angioplasty status: Secondary | ICD-10-CM

## 2013-07-10 DIAGNOSIS — M5137 Other intervertebral disc degeneration, lumbosacral region: Secondary | ICD-10-CM | POA: Diagnosis present

## 2013-07-10 DIAGNOSIS — I10 Essential (primary) hypertension: Secondary | ICD-10-CM

## 2013-07-10 DIAGNOSIS — R0989 Other specified symptoms and signs involving the circulatory and respiratory systems: Secondary | ICD-10-CM | POA: Diagnosis not present

## 2013-07-10 DIAGNOSIS — Z950 Presence of cardiac pacemaker: Secondary | ICD-10-CM

## 2013-07-10 DIAGNOSIS — Z981 Arthrodesis status: Secondary | ICD-10-CM

## 2013-07-10 DIAGNOSIS — S298XXA Other specified injuries of thorax, initial encounter: Secondary | ICD-10-CM | POA: Diagnosis not present

## 2013-07-10 DIAGNOSIS — R9389 Abnormal findings on diagnostic imaging of other specified body structures: Secondary | ICD-10-CM | POA: Diagnosis not present

## 2013-07-10 DIAGNOSIS — Z8546 Personal history of malignant neoplasm of prostate: Secondary | ICD-10-CM

## 2013-07-10 DIAGNOSIS — J939 Pneumothorax, unspecified: Secondary | ICD-10-CM

## 2013-07-10 DIAGNOSIS — F3289 Other specified depressive episodes: Secondary | ICD-10-CM | POA: Diagnosis present

## 2013-07-10 HISTORY — DX: Dependence on other enabling machines and devices: Z99.89

## 2013-07-10 HISTORY — DX: Obstructive sleep apnea (adult) (pediatric): G47.33

## 2013-07-10 LAB — CBC
Hemoglobin: 11.8 g/dL — ABNORMAL LOW (ref 13.0–17.0)
MCH: 30.4 pg (ref 26.0–34.0)
MCHC: 33.3 g/dL (ref 30.0–36.0)

## 2013-07-10 LAB — COMPREHENSIVE METABOLIC PANEL
ALT: 18 U/L (ref 0–53)
Calcium: 10.2 mg/dL (ref 8.4–10.5)
GFR calc Af Amer: 58 mL/min — ABNORMAL LOW (ref >90)
Glucose, Bld: 112 mg/dL — ABNORMAL HIGH (ref 70–99)
Sodium: 140 mEq/L (ref 135–145)
Total Protein: 7.4 g/dL (ref 6.0–8.3)

## 2013-07-10 LAB — CBC WITH DIFFERENTIAL/PLATELET
Basophils Relative: 0.4 % (ref 0.0–3.0)
Eosinophils Absolute: 0.4 10*3/uL (ref 0.0–0.7)
HCT: 35.6 % — ABNORMAL LOW (ref 39.0–52.0)
Hemoglobin: 11.8 g/dL — ABNORMAL LOW (ref 13.0–17.0)
Lymphocytes Relative: 28 % (ref 12.0–46.0)
Lymphs Abs: 2.2 10*3/uL (ref 0.7–4.0)
MCHC: 33.2 g/dL (ref 30.0–36.0)
MCV: 92.3 fl (ref 78.0–100.0)
Monocytes Absolute: 0.6 10*3/uL (ref 0.1–1.0)
Neutro Abs: 4.6 10*3/uL (ref 1.4–7.7)
RBC: 3.86 Mil/uL — ABNORMAL LOW (ref 4.22–5.81)

## 2013-07-10 LAB — BASIC METABOLIC PANEL
CO2: 28 mEq/L (ref 19–32)
Chloride: 104 mEq/L (ref 96–112)
Sodium: 140 mEq/L (ref 135–145)

## 2013-07-10 MED ORDER — POTASSIUM CHLORIDE IN NACL 20-0.9 MEQ/L-% IV SOLN
INTRAVENOUS | Status: DC
  Administered 2013-07-10: 23:00:00 via INTRAVENOUS
  Filled 2013-07-10 (×2): qty 1000

## 2013-07-10 MED ORDER — POLYVINYL ALCOHOL 1.4 % OP SOLN
1.0000 [drp] | Freq: Three times a day (TID) | OPHTHALMIC | Status: DC | PRN
  Filled 2013-07-10: qty 15

## 2013-07-10 MED ORDER — FENTANYL CITRATE 0.05 MG/ML IJ SOLN
INTRAMUSCULAR | Status: AC | PRN
  Administered 2013-07-10 (×3): 25 ug via INTRAVENOUS

## 2013-07-10 MED ORDER — MIDAZOLAM HCL 2 MG/2ML IJ SOLN
INTRAMUSCULAR | Status: AC | PRN
  Administered 2013-07-10: 0.5 mg via INTRAVENOUS
  Administered 2013-07-10: 1 mg via INTRAVENOUS

## 2013-07-10 MED ORDER — PANTOPRAZOLE SODIUM 40 MG IV SOLR
40.0000 mg | Freq: Every day | INTRAVENOUS | Status: DC
  Filled 2013-07-10 (×2): qty 40

## 2013-07-10 MED ORDER — DOCUSATE SODIUM 100 MG PO CAPS
100.0000 mg | ORAL_CAPSULE | Freq: Two times a day (BID) | ORAL | Status: DC
  Administered 2013-07-10 – 2013-07-13 (×6): 100 mg via ORAL
  Filled 2013-07-10 (×5): qty 1

## 2013-07-10 MED ORDER — FENTANYL CITRATE 0.05 MG/ML IJ SOLN
INTRAMUSCULAR | Status: AC
  Filled 2013-07-10: qty 2

## 2013-07-10 MED ORDER — HYDROCODONE-ACETAMINOPHEN 5-325 MG PO TABS
0.5000 | ORAL_TABLET | ORAL | Status: DC | PRN
  Filled 2013-07-10: qty 1

## 2013-07-10 MED ORDER — FUROSEMIDE 80 MG PO TABS
80.0000 mg | ORAL_TABLET | ORAL | Status: DC
  Administered 2013-07-11 – 2013-07-13 (×2): 80 mg via ORAL
  Filled 2013-07-10: qty 1
  Filled 2013-07-10: qty 2

## 2013-07-10 MED ORDER — MIDAZOLAM HCL 2 MG/2ML IJ SOLN: 4.0000 mg | Freq: Once | INTRAMUSCULAR | Status: DC

## 2013-07-10 MED ORDER — FENTANYL CITRATE 0.05 MG/ML IJ SOLN: 100.0000 ug | Freq: Once | INTRAMUSCULAR | Status: DC

## 2013-07-10 MED ORDER — MORPHINE SULFATE 2 MG/ML IJ SOLN
1.0000 mg | INTRAMUSCULAR | Status: DC | PRN
  Administered 2013-07-10 (×2): 1 mg via INTRAVENOUS
  Filled 2013-07-10 (×2): qty 1

## 2013-07-10 MED ORDER — ONDANSETRON HCL 4 MG/2ML IJ SOLN: 4.0000 mg | Freq: Four times a day (QID) | INTRAMUSCULAR | Status: DC | PRN

## 2013-07-10 MED ORDER — MIDAZOLAM HCL 2 MG/2ML IJ SOLN
INTRAMUSCULAR | Status: AC
  Filled 2013-07-10: qty 2

## 2013-07-10 MED ORDER — ISOSORBIDE MONONITRATE ER 30 MG PO TB24
30.0000 mg | ORAL_TABLET | Freq: Every day | ORAL | Status: DC
  Administered 2013-07-10 – 2013-07-13 (×4): 30 mg via ORAL
  Filled 2013-07-10 (×4): qty 1

## 2013-07-10 MED ORDER — FUROSEMIDE 40 MG PO TABS: 40.0000 mg | ORAL_TABLET | ORAL | Status: DC

## 2013-07-10 MED ORDER — HYDROCODONE-ACETAMINOPHEN 5-325 MG PO TABS
1.0000 | ORAL_TABLET | ORAL | Status: DC | PRN
  Administered 2013-07-10 – 2013-07-11 (×2): 1 via ORAL
  Filled 2013-07-10: qty 1

## 2013-07-10 MED ORDER — ONDANSETRON HCL 4 MG PO TABS: 4.0000 mg | ORAL_TABLET | Freq: Four times a day (QID) | ORAL | Status: DC | PRN

## 2013-07-10 MED ORDER — PANTOPRAZOLE SODIUM 40 MG PO TBEC
40.0000 mg | DELAYED_RELEASE_TABLET | Freq: Every day | ORAL | Status: DC
  Administered 2013-07-10: 40 mg via ORAL
  Filled 2013-07-10: qty 1

## 2013-07-10 MED ORDER — HYPROMELLOSE (GONIOSCOPIC) 2.5 % OP SOLN: 1.0000 [drp] | Freq: Three times a day (TID) | OPHTHALMIC | Status: DC | PRN

## 2013-07-10 MED ORDER — CITALOPRAM HYDROBROMIDE 20 MG PO TABS
20.0000 mg | ORAL_TABLET | Freq: Every day | ORAL | Status: DC
  Administered 2013-07-11 – 2013-07-13 (×3): 20 mg via ORAL
  Filled 2013-07-10 (×3): qty 1

## 2013-07-10 MED ORDER — NITROGLYCERIN 0.4 MG SL SUBL: 0.4000 mg | SUBLINGUAL_TABLET | SUBLINGUAL | Status: DC | PRN

## 2013-07-10 MED ORDER — HEPARIN SODIUM (PORCINE) 5000 UNIT/ML IJ SOLN
5000.0000 [IU] | Freq: Three times a day (TID) | INTRAMUSCULAR | Status: DC
  Administered 2013-07-11: 5000 [IU] via SUBCUTANEOUS
  Filled 2013-07-10 (×4): qty 1

## 2013-07-10 MED ORDER — GABAPENTIN 300 MG PO CAPS
300.0000 mg | ORAL_CAPSULE | Freq: Three times a day (TID) | ORAL | Status: DC
Start: 2013-07-10 — End: 2013-07-13
  Administered 2013-07-10 – 2013-07-13 (×8): 300 mg via ORAL
  Filled 2013-07-10 (×11): qty 1

## 2013-07-10 NOTE — ED Notes (Signed)
Pt was having SOB and HTN and went to see cardiologist. Had CXR and was called back in stating that he had a R sided pneumothorax. Pt denies pain. Alert and oriented. Lives at home.

## 2013-07-10 NOTE — Patient Instructions (Signed)
Labs Today: Bmet, Bnp, Cbc  A chest x-ray takes a picture of the organs and structures inside the chest, including the heart, lungs, and blood vessels. This test can show several things, including, whether the heart is enlarges; whether fluid is building up in the lungs; and whether pacemaker / defibrillator leads are still in place.  Follow up pending results of chest xray and labs

## 2013-07-10 NOTE — Progress Notes (Addendum)
Right Chest Tube Insertion and IV conscious sedation (30 minutes) Procedure Note  Indications:  Clinically significant Pneumothorax -Right  Pre-operative Diagnosis: Right Pneumothorax  Post-operative Diagnosis: same  Anesthesia: 50 micrograms IV fentanyl and 1.5mg  versed and 30cc 1% xylocaine with epi  Procedure Details  Informed consent was obtained for the procedure, including sedation.  Risks of lung perforation, hemorrhage, arrhythmia, and adverse drug reaction were discussed.   After sterile skin prep, using standard technique, a 20 French tube was placed in the right lateral 4th rib space. There was a positive rush of air when I entered the pleural space.  Immediate post procedure chest x-ray Demonstrated that the chest tube was not in the thoracic cavity but in fact subcutaneous along the rib cage. The pneumothorax have improved; however, because it made a defect in the pleural lining I recommended repositioning of the chest tube into the thoracic cavity to the patient and his daughter.  The patient was redraped and prepped with ChloraPrep and Betadine. The previous sutures securing the chest tube were cut and removed. The chest tube was repositioned to go through the fourth rib space. There is condensation within the tube. The tube was secured at the skin at 18 cm. The remaining skin defect was closed with interrupted 0 silk sutures. A sterile dressing was reapplied after triple antibiotic ointment was applied.  A followup chest x-ray demonstrated that the chest tube is in the right thoracic cavity Findings: See above  Estimated Blood Loss:  Minimal         Specimens:  None              Complications:  See above         Disposition: emergency department for monitoring and then transferred to step down unit         Condition: stable  Attending Attestation: I performed the procedure.  Mary Sella. Andrey Campanile, MD, FACS General, Bariatric, & Minimally Invasive Surgery [facility]  Surgery, [place]

## 2013-07-10 NOTE — H&P (Signed)
History   MAOR MECKEL is an 79 y.o. male.   Chief Complaint: fall  79 year old Caucasian male who was at a concert on Friday night when his legs gave out from underneath him and he partially fell. Part of his right chest landed against a staircase banister. The following morning he awoke with shortness of breath. He denies LOC. He was caught by bystanders so didn't completely fall. His shortness of breath persisted so he contacted his cardiologist's office who ordered an outpatient chest x-ray which demonstrated a right pneumothorax. He was sent to [facility] long and was transferred to [facility] for chest tube placement and admission. He is on Plavix. He has neuropathy in bilateral lower extremities due to spinal stenosis. He uses CPAP at night for obstructive sleep apnea Fall The current episode started in the past 7 days. The problem has been gradually worsening. Associated symptoms include neck pain (chronic). Pertinent negatives include no abdominal pain, chest pain, chills, coughing, fever, headaches, myalgias, nausea or vomiting.    Past Medical History  Diagnosis Date  . Cancer   . Prostate cancer   . Neuropathy   . Hypercholesteremia   . Depressive disorder, not elsewhere classified   . Angina pectoris   . Sinoatrial node dysfunction   . Degeneration of lumbar or lumbosacral intervertebral disc   . Dyspnea   . CAD (coronary artery disease)      NYHA Class 1B, CABG 1985  . HTN (hypertension)   . OSA on CPAP     Past Surgical History  Procedure Laterality Date  . Coronary artery bypass graft    . Exploratory laparotomy    . Endarterectomy    . Rotator cuff repair    . Lumbar fusion    . Coronary angioplasty with stent placement      s/p bare metal stent implant SVG-diagonal 11/23/05, s/p BM stent implantation SVG diagonal 10/22/06 (feeds LAD), and 05/2010 with DES to left circumflex  . Pacemaker insertion      History reviewed. No pertinent family history. Social  History:  reports that he has never smoked. He does not have any smokeless tobacco history on file. He reports that he does not drink alcohol or use illicit drugs.  Allergies   Allergies  Allergen Reactions  . Penicillins Swelling    Home Medications   (Not in a hospital admission)  Trauma Course   Results for orders placed during the hospital encounter of 07/10/13 (from the past 48 hour(s))  CBC     Status: Abnormal   Collection Time    07/10/13  4:00 PM      Result Value Range   WBC 7.9  4.0 - 10.5 K/uL   RBC 3.88 (*) 4.22 - 5.81 MIL/uL   Hemoglobin 11.8 (*) 13.0 - 17.0 g/dL   HCT 82.9 (*) 56.2 - 13.0 %   MCV 91.2  78.0 - 100.0 fL   MCH 30.4  26.0 - 34.0 pg   MCHC 33.3  30.0 - 36.0 g/dL   RDW 86.5  78.4 - 69.6 %   Platelets 168  150 - 400 K/uL  COMPREHENSIVE METABOLIC PANEL     Status: Abnormal   Collection Time    07/10/13  4:00 PM      Result Value Range   Sodium 140  135 - 145 mEq/L   Potassium 3.5  3.5 - 5.1 mEq/L   Chloride 102  96 - 112 mEq/L   CO2 27  19 - 32 mEq/L  Glucose, Bld 112 (*) 70 - 99 mg/dL   BUN 26 (*) 6 - 23 mg/dL   Creatinine, Ser 1.61  0.50 - 1.35 mg/dL   Calcium 09.6  8.4 - 04.5 mg/dL   Total Protein 7.4  6.0 - 8.3 g/dL   Albumin 3.6  3.5 - 5.2 g/dL   AST 20  0 - 37 U/L   ALT 18  0 - 53 U/L   Alkaline Phosphatase 70  39 - 117 U/L   Total Bilirubin 0.3  0.3 - 1.2 mg/dL   GFR calc non Af Amer 50 (*) >90 mL/min   GFR calc Af Amer 58 (*) >90 mL/min   Comment: (NOTE)     The eGFR has been calculated using the CKD EPI equation.     This calculation has not been validated in all clinical situations.     eGFR's persistently <90 mL/min signify possible Chronic Kidney     Disease.   Dg Chest 2 View  07/10/2013   CLINICAL DATA:  Recent fall with right-sided pain and dyspnea. Pacemaker.  EXAM: CHEST  2 VIEW  COMPARISON:  Chest CT 08/04/2011. Most recent radiograph of 06/17/2010  FINDINGS: Prior median sternotomy. No displaced rib fracture  identified. Prior right rotator cuff repair with surgical clips in the left supraclavicular region.  Midline trachea. Normal heart size. Pacer with leads at right atrium and right ventricle. Aortic atherosclerosis. No pleural fluid. A moderate right-sided pneumothorax. Visceral pleural line maximally 3.5 cm from chest wall. Underlying calcified granulomas in the right lung base. Left lung clear.  IMPRESSION: 1. Moderate right-sided pneumothorax. Approximately 35%. These results were called by telephone at the time of interpretation on 07/10/2013 at 1:29 PM to [facility], Dr. Michaelle Copas assistant, who verbally acknowledged these results. 2. No definite rib fracture identified.   Electronically Signed   By: Jeronimo Greaves M.D.   On: 07/10/2013 13:40    Review of Systems  Constitutional: Negative for fever, chills and weight loss.  HENT: Negative for ear discharge, ear pain, hearing loss and tinnitus.   Eyes: Negative for blurred vision, double vision, photophobia and pain.  Respiratory: Positive for shortness of breath. Negative for cough and sputum production.   Cardiovascular: Positive for leg swelling. Negative for chest pain and orthopnea.  Gastrointestinal: Negative for nausea, vomiting and abdominal pain.  Genitourinary: Negative for dysuria and flank pain.  Musculoskeletal: Positive for back pain (chronic), falls (fall in Spring 2014) and neck pain (chronic). Negative for joint pain and myalgias.  Neurological: Negative for dizziness, tingling, sensory change, focal weakness, seizures, loss of consciousness and headaches.       Has neuropathy in b/l LE from spinal stenosis  Endo/Heme/Allergies: Does not bruise/bleed easily.  Psychiatric/Behavioral: Negative for depression, memory loss and substance abuse. The patient is not nervous/anxious.     Blood pressure 155/90, pulse 110, temperature 98 F (36.7 C), temperature source Oral, resp. rate 20, SpO2 100.00%. Physical Exam  Vitals  reviewed. Constitutional: He is oriented to person, place, and time. He appears well-developed and well-nourished. No distress.  Some HTN  HENT:  Head: Normocephalic and atraumatic.  Right Ear: External ear normal.  Left Ear: External ear normal.  Eyes: Conjunctivae are normal. Pupils are equal, round, and reactive to light.  Neck: Normal range of motion. Neck supple. No tracheal deviation present.  Cardiovascular: Normal rate and intact distal pulses.   pacemaker  Respiratory: Effort normal. No accessory muscle usage or stridor. Not tachypneic. No respiratory distress. He has decreased  breath sounds in the left upper field and the left middle field. He has no wheezes.    GI: Soft. Normal appearance. There is no tenderness. There is no rebound and no guarding.    Musculoskeletal: Normal range of motion. He exhibits no edema and no tenderness.  Lymphadenopathy:    He has no cervical adenopathy.  Neurological: He is alert and oriented to person, place, and time. He exhibits normal muscle tone.  Skin: Skin is warm and dry. No rash noted. He is not diaphoretic. No erythema.  Psychiatric: He has a normal mood and affect. His behavior is normal. Judgment and thought content normal.     Assessment/Plan Fall Right ptx OSA on CPAP Anticoagulation on plavix Neuropathy CAD HTN Anemia  Place R chest tube Admit SDU pulm toilet, IS O2 andCPAP at night PT/OT in am Repeat CXR in am Cont home meds except for plavix  Mary Sella. Andrey Campanile, MD, FACS General, Bariatric, & Minimally Invasive Surgery [facility] Surgery, [place]   [facility] M 07/10/2013, 8:32 PM   Procedures

## 2013-07-10 NOTE — ED Notes (Signed)
Pt arrived to bed D31 by care link from [facility] long ED.

## 2013-07-10 NOTE — Progress Notes (Signed)
Patient ID: JED KUTCH, male   DOB: 1926-08-09, 79 y.o.   MRN: [id number]    [address] [place], [place]  [address] Phone: [phone] Fax:  [phone]  Date:  07/10/2013   ID:  Edward Woodward, DOB 1927/05/31, MRN [id number]  PCP:  Ginette Otto, MD   ASSESSMENT:  1. Dyspnea since falling on his right side on Friday evening. There is a component of orthopnea. Rule out pneumothorax 2. Coronary artery disease 3. CAD, without active symptoms  PLAN:  1. Chest x-ray PA and lateral to rule out effusion and/or pneumothorax 2. BNP, Basic metabolic panel, and CBC   SUBJECTIVE: Edward Woodward is a 79 y.o. male who since Friday evening has had dyspnea on exertion and at rest with some orthopnea. Prior to developing dyspnea he fell on his right side. He has some chest soreness but no focal discomfort. There is no lower extremity swelling and he denies angina.   Wt Readings from Last 3 Encounters:  07/10/13 210 lb (95.255 kg)  06/07/13 222 lb (100.699 kg)  06/02/13 224 lb (101.606 kg)     Past Medical History  Diagnosis Date  . Cancer   . Prostate cancer   . Neuropathy   . Hypercholesteremia   . Depressive disorder, not elsewhere classified   . Angina pectoris   . Sinoatrial node dysfunction   . Degeneration of lumbar or lumbosacral intervertebral disc   . Dyspnea   . CAD (coronary artery disease)      NYHA Class 1B, CABG 1985  . HTN (hypertension)     Current Outpatient Prescriptions  Medication Sig Dispense Refill  . aspirin 325 MG tablet Take 325 mg by mouth daily.        . ATORVASTATIN CALCIUM PO Take by mouth every other day. Unsure of dose      . b complex vitamins tablet Take 1 tablet by mouth daily.        . citalopram (CELEXA) 20 MG tablet Take 20 mg by mouth daily.        . clopidogrel (PLAVIX) 75 MG tablet Take 75 mg by mouth daily.        Marland Kitchen docusate sodium (COLACE) 100 MG capsule Take 100 mg by mouth 2 (two) times daily.          . furosemide (LASIX) 40 MG tablet Take 40 mg by mouth daily. Takes 40 mg alternates with 80mg       . gabapentin (NEURONTIN) 300 MG capsule Take 300 mg by mouth 3 (three) times daily.       . isosorbide mononitrate (IMDUR) 60 MG 24 hr tablet Take 30 mg by mouth daily.      . nitroGLYCERIN (NITROSTAT) 0.4 MG SL tablet Place 0.4 mg under the tongue every 5 (five) minutes as needed for chest pain.      . Nutritional Supplements (MELATONIN PO) Take 1 tablet by mouth at bedtime.        Marland Kitchen UBIQUINOL PO Take 1 tablet by mouth daily. CoQ10      . zolpidem (AMBIEN) 5 MG tablet Take 5 mg by mouth at bedtime as needed.       No current facility-administered medications for this visit.    Allergies:    Allergies  Allergen Reactions  . Penicillins Swelling    Social History:  The patient  reports that he has never smoked. He does not have any smokeless tobacco history on file. He reports that he  does not drink alcohol or use illicit drugs.   ROS:  Please see the history of present illness.      All other systems reviewed and negative.   OBJECTIVE: VS:  BP 150/86  Pulse 96  Ht 5\' 10"  (1.778 m)  Wt 210 lb (95.255 kg)  BMI 30.13 kg/m2 Well nourished, well developed, in no acute distress, appears frightened HEENT: normal Neck: JVD difficult to assess. Carotid bruit faint the left  Cardiac:  normal S1, S2; RRR; no murmur Lungs:   markedly decreased breath sounds on the right compared to left. No dullness to percussion.  Abd: soft, nontender, no hepatomegaly Ext: Edema  Absent . Pulses 2+ bilateral  Skin: warm and dry Neuro:  CNs 2-12 intact, no focal abnormalities noted  EKG:  Normal sinus rhythm with nonspecific ST abnormality.       Signed, Darci Needle III, MD 07/10/2013 10:36 AM

## 2013-07-10 NOTE — ED Notes (Signed)
Chest tube tray and sahara at bedside per RN request

## 2013-07-10 NOTE — ED Provider Notes (Signed)
CSN: [id number]     Arrival date & time 07/10/13  1532 History   First MD Initiated Contact with Patient 07/10/13 1555     No chief complaint on file.  (Consider location/radiation/quality/duration/timing/severity/associated sxs/prior Treatment) HPI Comments: 79 year old male comes by EMS with a right-sided pneumothorax. He states that he was at a concert tonight's ago in his left leg gave out due to his neuropathy. He started to fall but was caught. He states that he does not specifically remember injuring himself but may have fallen into someone else. He is having some right sided posterior/axillary chest pain.    Past Medical History  Diagnosis Date  . Cancer   . Prostate cancer   . Neuropathy   . Hypercholesteremia   . Depressive disorder, not elsewhere classified   . Angina pectoris   . Sinoatrial node dysfunction   . Degeneration of lumbar or lumbosacral intervertebral disc   . Dyspnea   . CAD (coronary artery disease)      NYHA Class 1B, CABG 1985  . HTN (hypertension)    Past Surgical History  Procedure Laterality Date  . Coronary artery bypass graft    . Exploratory laparotomy    . Endarterectomy    . Rotator cuff repair    . Lumbar fusion    . Coronary angioplasty with stent placement      s/p bare metal stent implant SVG-diagonal 11/23/05, s/p BM stent implantation SVG diagonal 10/22/06 (feeds LAD), and 05/2010 with DES to left circumflex  . Pacemaker insertion     History reviewed. No pertinent family history. History  Substance Use Topics  . Smoking status: Never Smoker   . Smokeless tobacco: Not on file  . Alcohol Use: No    Review of Systems  Respiratory: Positive for shortness of breath.   Cardiovascular: Positive for chest pain (right sided/back).  Gastrointestinal: Negative for vomiting and abdominal pain.  Musculoskeletal: Positive for back pain.  Skin: Negative for wound.  Neurological: Negative for weakness.  All other systems reviewed and are  negative.    Allergies  Penicillins  Home Medications   Current Outpatient Rx  Name  Route  Sig  Dispense  Refill  . aspirin 325 MG tablet   Oral   Take 325 mg by mouth daily.           . ATORVASTATIN CALCIUM PO   Oral   Take by mouth every other day. Unsure of dose         . b complex vitamins tablet   Oral   Take 1 tablet by mouth daily.           . citalopram (CELEXA) 20 MG tablet   Oral   Take 20 mg by mouth daily.           . clopidogrel (PLAVIX) 75 MG tablet   Oral   Take 75 mg by mouth daily.           Marland Kitchen docusate sodium (COLACE) 100 MG capsule   Oral   Take 100 mg by mouth 2 (two) times daily.           . furosemide (LASIX) 40 MG tablet   Oral   Take 40 mg by mouth daily. Takes 40 mg alternates with 80mg          . gabapentin (NEURONTIN) 300 MG capsule   Oral   Take 300 mg by mouth 3 (three) times daily.          Marland Kitchen  isosorbide mononitrate (IMDUR) 60 MG 24 hr tablet   Oral   Take 30 mg by mouth daily.         . nitroGLYCERIN (NITROSTAT) 0.4 MG SL tablet   Sublingual   Place 0.4 mg under the tongue every 5 (five) minutes as needed for chest pain.         . Nutritional Supplements (MELATONIN PO)   Oral   Take 1 tablet by mouth at bedtime.           Marland Kitchen UBIQUINOL PO   Oral   Take 1 tablet by mouth daily. CoQ10         . zolpidem (AMBIEN) 5 MG tablet   Oral   Take 5 mg by mouth at bedtime as needed.          BP 168/77  Pulse 76  Temp(Src) 98.2 F (36.8 C) (Oral)  Resp 20  SpO2 96% Physical Exam  Nursing note and vitals reviewed. Constitutional: He is oriented to person, place, and time. He appears well-developed and well-nourished.  HENT:  Head: Normocephalic and atraumatic.  Right Ear: External ear normal.  Left Ear: External ear normal.  Nose: Nose normal.  Eyes: Right eye exhibits no discharge. Left eye exhibits no discharge.  Neck: Neck supple.  Cardiovascular: Normal rate, regular rhythm, normal heart sounds  and intact distal pulses.   Pulmonary/Chest: Effort normal. He has decreased breath sounds (right sided). He exhibits tenderness (right sided).  Abdominal: Soft. There is no tenderness.  Musculoskeletal: He exhibits no edema.  Neurological: He is alert and oriented to person, place, and time.  Skin: Skin is warm and dry.    ED Course  Procedures (including critical care time) Labs Review Labs Reviewed  CBC  COMPREHENSIVE METABOLIC PANEL   Imaging Review Dg Chest 2 View  07/10/2013   CLINICAL DATA:  Recent fall with right-sided pain and dyspnea. Pacemaker.  EXAM: CHEST  2 VIEW  COMPARISON:  Chest CT 08/04/2011. Most recent radiograph of 06/17/2010  FINDINGS: Prior median sternotomy. No displaced rib fracture identified. Prior right rotator cuff repair with surgical clips in the left supraclavicular region.  Midline trachea. Normal heart size. Pacer with leads at right atrium and right ventricle. Aortic atherosclerosis. No pleural fluid. A moderate right-sided pneumothorax. Visceral pleural line maximally 3.5 cm from chest wall. Underlying calcified granulomas in the right lung base. Left lung clear.  IMPRESSION: 1. Moderate right-sided pneumothorax. Approximately 35%. These results were called by telephone at the time of interpretation on 07/10/2013 at 1:29 PM to [facility], Dr. Michaelle Copas assistant, who verbally acknowledged these results. 2. No definite rib fracture identified.   Electronically Signed   By: Jeronimo Greaves M.D.   On: 07/10/2013 13:40    EKG Interpretation   None       MDM   1. Pneumothorax, right    Patient appears well, has mild tachypnea but does not appear as per stress. He is not hypoxic was placed on 2 L. Given the short of breath started shortly after a fall where he may pump and other people in his chest wall pain there is concern that this was a traumatic episode. I discussed with Dr. Lindie Spruce, the trauma surgeon on call, who accepts patient in transfer to [facility] ER.  This is been gone for 48 hours and there is no sign of decompensation or respiratory distress I feel that he be transferred over there or trauma to place a chest tube. Discussed this with the patient  and family as well as risks of transfer and possible worsening and they're okay with transfer for thoracostomy in the ED    Audree Camel, MD 07/10/13 [phone]

## 2013-07-10 NOTE — ED Notes (Signed)
MD (trauma surgery consult) at bedside.

## 2013-07-10 NOTE — ED Provider Notes (Signed)
CSN: [id number]     Arrival date & time 07/10/13  1532 History   First MD Initiated Contact with Patient 07/10/13 1555     No chief complaint on file.  (Consider location/radiation/quality/duration/timing/severity/associated sxs/prior Treatment) HPI Edward Woodward is a 79 y.o. male who presents to the emergency department as a transfer from [facility] for concern fo a pneuothorax.  Patient reports that last night he was at a concert and had a fall where he bumped his R chest on a railing.  He didn't fall to the ground or lose consciousness.  He had a little bit of bruising at the site but did not think anything of it.  Then, this AM at roughly 0300 he had an episode where the R sided chest pain was worse and he was SOB.  This subsided and he went back to bed.  Went in to the OSH today and was transferred here for further evaluation.  Past Medical History  Diagnosis Date  . Cancer   . Prostate cancer   . Neuropathy   . Hypercholesteremia   . Depressive disorder, not elsewhere classified   . Angina pectoris   . Sinoatrial node dysfunction   . Degeneration of lumbar or lumbosacral intervertebral disc   . Dyspnea   . CAD (coronary artery disease)      NYHA Class 1B, CABG 1985  . HTN (hypertension)    Past Surgical History  Procedure Laterality Date  . Coronary artery bypass graft    . Exploratory laparotomy    . Endarterectomy    . Rotator cuff repair    . Lumbar fusion    . Coronary angioplasty with stent placement      s/p bare metal stent implant SVG-diagonal 11/23/05, s/p BM stent implantation SVG diagonal 10/22/06 (feeds LAD), and 05/2010 with DES to left circumflex  . Pacemaker insertion     History reviewed. No pertinent family history. History  Substance Use Topics  . Smoking status: Never Smoker   . Smokeless tobacco: Not on file  . Alcohol Use: No    Review of Systems  Constitutional: Negative for fever and chills.  HENT: Negative for congestion and sore throat.    Respiratory: Positive for shortness of breath. Negative for cough.   Cardiovascular: Positive for chest pain.  Gastrointestinal: Negative for nausea, vomiting, abdominal pain, diarrhea and constipation.  Endocrine: Negative for polyuria.  Genitourinary: Negative for dysuria and hematuria.  Musculoskeletal: Negative for neck pain.  Skin: Negative for rash.  Neurological: Negative for headaches.  Psychiatric/Behavioral: Negative.   All other systems reviewed and are negative.    Allergies  Penicillins  Home Medications   Current Outpatient Rx  Name  Route  Sig  Dispense  Refill  . aspirin 325 MG tablet   Oral   Take 325 mg by mouth daily.           . ATORVASTATIN CALCIUM PO   Oral   Take by mouth every other day. Unsure of dose         . b complex vitamins tablet   Oral   Take 1 tablet by mouth daily.           . citalopram (CELEXA) 20 MG tablet   Oral   Take 20 mg by mouth daily.           . clopidogrel (PLAVIX) 75 MG tablet   Oral   Take 75 mg by mouth daily.           Marland Kitchen  docusate sodium (COLACE) 100 MG capsule   Oral   Take 100 mg by mouth 2 (two) times daily.           . furosemide (LASIX) 40 MG tablet   Oral   Take 40-80 mg by mouth daily. Take 40 mg or  80 mg every day. Alternate doses.         Marland Kitchen gabapentin (NEURONTIN) 300 MG capsule   Oral   Take 300 mg by mouth 3 (three) times daily.          . hydroxypropyl methylcellulose (ISOPTO TEARS) 2.5 % ophthalmic solution      1 drop.         . isosorbide mononitrate (IMDUR) 30 MG 24 hr tablet   Oral   Take 30 mg by mouth daily.         . nitroGLYCERIN (NITROSTAT) 0.4 MG SL tablet   Sublingual   Place 0.4 mg under the tongue every 5 (five) minutes as needed for chest pain.         . Nutritional Supplements (MELATONIN PO)   Oral   Take 1 tablet by mouth at bedtime.           Marland Kitchen UBIQUINOL PO   Oral   Take 1 tablet by mouth daily. CoQ10          BP 171/72  Pulse 74   Temp(Src) 98 F (36.7 C) (Oral)  Resp 20  SpO2 98% Physical Exam  Nursing note and vitals reviewed. Constitutional: He is oriented to person, place, and time. He appears well-developed and well-nourished. No distress.  HENT:  Head: Normocephalic and atraumatic.  Right Ear: External ear normal.  Left Ear: External ear normal.  Mouth/Throat: Oropharynx is clear and moist. No oropharyngeal exudate.  Eyes: Conjunctivae are normal. Pupils are equal, round, and reactive to light. Right eye exhibits no discharge.  Neck: Normal range of motion. Neck supple. No tracheal deviation present.  Cardiovascular: Normal rate, regular rhythm and intact distal pulses.   Pulmonary/Chest: Effort normal. No respiratory distress. He has decreased breath sounds (R sided). He has no wheezes. He has no rales.  Abdominal: Soft. He exhibits no distension. There is no tenderness. There is no rebound and no guarding.  Musculoskeletal: Normal range of motion.  Neurological: He is alert and oriented to person, place, and time.  Skin: Skin is warm and dry. No rash noted. He is not diaphoretic.  Psychiatric: He has a normal mood and affect.    ED Course  Procedures (including critical care time) Labs Review Labs Reviewed  CBC - Abnormal; Notable for the following:    RBC 3.88 (*)    Hemoglobin 11.8 (*)    HCT 35.4 (*)    All other components within normal limits  COMPREHENSIVE METABOLIC PANEL - Abnormal; Notable for the following:    Glucose, Bld 112 (*)    BUN 26 (*)    GFR calc non Af Amer 50 (*)    GFR calc Af Amer 58 (*)    All other components within normal limits   Imaging Review Dg Chest 2 View  07/10/2013   CLINICAL DATA:  Recent fall with right-sided pain and dyspnea. Pacemaker.  EXAM: CHEST  2 VIEW  COMPARISON:  Chest CT 08/04/2011. Most recent radiograph of 06/17/2010  FINDINGS: Prior median sternotomy. No displaced rib fracture identified. Prior right rotator cuff repair with surgical clips in  the left supraclavicular region.  Midline trachea. Normal heart size. Pacer with  leads at right atrium and right ventricle. Aortic atherosclerosis. No pleural fluid. A moderate right-sided pneumothorax. Visceral pleural line maximally 3.5 cm from chest wall. Underlying calcified granulomas in the right lung base. Left lung clear.  IMPRESSION: 1. Moderate right-sided pneumothorax. Approximately 35%. These results were called by telephone at the time of interpretation on 07/10/2013 at 1:29 PM to [facility], Dr. Michaelle Copas assistant, who verbally acknowledged these results. 2. No definite rib fracture identified.   Electronically Signed   By: Jeronimo Greaves M.D.   On: 07/10/2013 13:40    EKG Interpretation    Date/Time:  Monday July 10 2013 17:31:44 EST Ventricular Rate:  71 PR Interval:  215 QRS Duration: 119 QT Interval:  416 QTC Calculation: 452 R Axis:   21 Text Interpretation:  Sinus rhythm Borderline prolonged PR interval Nonspecific intraventricular conduction delay Probable anteroseptal infarct, old Borderline repolarization abnormality Subtle ST depression lateral Confirmed by ZAVITZ  MD, JOSHUA ([id number]) on 07/10/2013 5:52:59 PM            MDM   1. Pneumothorax, right    DEMETRIO LEIGHTY is a 79 y.o. male who presents to the emergency department for concern of a R sided ptx.  Clinically stable on presentation.  No tension physiology.  No distress.  Trauma surgery consulted for chest tube placement and admission.  All questions answered.  No other acute concerns.  Patient admitted.    Arloa Koh, MD 07/10/13 2027

## 2013-07-10 NOTE — Telephone Encounter (Signed)
Per Dr.Smith instructions 911 called EMS dispatched to pt home. called and spoke with pt he is alert and will open door for ems.instruction given to 911 pt to be transported to [facility] ED if safe to do so, if not pt is to be taken to [facility] ED.spoke with pt wife she was out running errands, pt left home alone,she will return home immediately.

## 2013-07-10 NOTE — ED Notes (Signed)
Dr. Criss Alvine moving pt to [facility] ED for insertion of Chest tube. Dr. Denton Lank EDP and Dr. Lindie Spruce Trauma aware. Report given to Freight forwarder at [employer].

## 2013-07-10 NOTE — Telephone Encounter (Signed)
received a call in the office form Dr.Talbot pt has right moderate pneumothorax and he is sending pt to the ED. Dr.Smith aware and agreeable with plan

## 2013-07-10 NOTE — Telephone Encounter (Signed)
Follow up     [facility] [place] [facility] [employer] [place] on [address] today please.

## 2013-07-11 ENCOUNTER — Inpatient Hospital Stay (HOSPITAL_COMMUNITY): Payer: Medicare Other

## 2013-07-11 ENCOUNTER — Encounter (HOSPITAL_COMMUNITY): Payer: Self-pay | Admitting: *Deleted

## 2013-07-11 DIAGNOSIS — I2510 Atherosclerotic heart disease of native coronary artery without angina pectoris: Secondary | ICD-10-CM

## 2013-07-11 DIAGNOSIS — R0989 Other specified symptoms and signs involving the circulatory and respiratory systems: Secondary | ICD-10-CM | POA: Diagnosis not present

## 2013-07-11 DIAGNOSIS — S270XXA Traumatic pneumothorax, initial encounter: Secondary | ICD-10-CM

## 2013-07-11 DIAGNOSIS — W19XXXA Unspecified fall, initial encounter: Secondary | ICD-10-CM

## 2013-07-11 DIAGNOSIS — D62 Acute posthemorrhagic anemia: Secondary | ICD-10-CM | POA: Diagnosis not present

## 2013-07-11 LAB — BASIC METABOLIC PANEL
CO2: 22 mEq/L (ref 19–32)
Calcium: 9.2 mg/dL (ref 8.4–10.5)
Chloride: 104 mEq/L (ref 96–112)
Creatinine, Ser: 1.08 mg/dL (ref 0.50–1.35)
GFR calc Af Amer: 71 mL/min — ABNORMAL LOW (ref >90)
GFR calc non Af Amer: 61 mL/min — ABNORMAL LOW (ref >90)
Glucose, Bld: 122 mg/dL — ABNORMAL HIGH (ref 70–99)
Potassium: 3.8 mEq/L (ref 3.5–5.1)

## 2013-07-11 LAB — CBC
Hemoglobin: 10.7 g/dL — ABNORMAL LOW (ref 13.0–17.0)
MCH: 30.5 pg (ref 26.0–34.0)
MCV: 93.2 fL (ref 78.0–100.0)
Platelets: 151 10*3/uL (ref 150–400)
RBC: 3.51 MIL/uL — ABNORMAL LOW (ref 4.22–5.81)
RDW: 14 % (ref 11.5–15.5)
WBC: 7.5 10*3/uL (ref 4.0–10.5)

## 2013-07-11 LAB — BRAIN NATRIURETIC PEPTIDE: Pro B Natriuretic peptide (BNP): 119 pg/mL — ABNORMAL HIGH (ref 0.0–100.0)

## 2013-07-11 LAB — MRSA PCR SCREENING: MRSA by PCR: NEGATIVE

## 2013-07-11 MED ORDER — ASPIRIN 325 MG PO TABS: 325.0000 mg | ORAL_TABLET | Freq: Every day | ORAL | Status: DC

## 2013-07-11 MED ORDER — MORPHINE SULFATE 2 MG/ML IJ SOLN
2.0000 mg | INTRAMUSCULAR | Status: DC | PRN
  Administered 2013-07-13: 2 mg via INTRAVENOUS
  Filled 2013-07-11: qty 1

## 2013-07-11 MED ORDER — ASPIRIN EC 325 MG PO TBEC
325.0000 mg | DELAYED_RELEASE_TABLET | Freq: Every day | ORAL | Status: DC
  Administered 2013-07-11 – 2013-07-13 (×3): 325 mg via ORAL
  Filled 2013-07-11 (×3): qty 1

## 2013-07-11 MED ORDER — TRAMADOL HCL 50 MG PO TABS
50.0000 mg | ORAL_TABLET | Freq: Four times a day (QID) | ORAL | Status: DC | PRN
  Administered 2013-07-11 – 2013-07-12 (×2): 50 mg via ORAL
  Filled 2013-07-11: qty 1
  Filled 2013-07-11: qty 2

## 2013-07-11 MED ORDER — POLYETHYLENE GLYCOL 3350 17 G PO PACK
17.0000 g | PACK | Freq: Every day | ORAL | Status: DC
  Administered 2013-07-11 – 2013-07-12 (×2): 17 g via ORAL
  Filled 2013-07-11 (×3): qty 1

## 2013-07-11 MED ORDER — ENOXAPARIN SODIUM 30 MG/0.3ML ~~LOC~~ SOLN
30.0000 mg | Freq: Two times a day (BID) | SUBCUTANEOUS | Status: DC
  Administered 2013-07-11 – 2013-07-13 (×5): 30 mg via SUBCUTANEOUS
  Filled 2013-07-11 (×7): qty 0.3

## 2013-07-11 MED ORDER — CLOPIDOGREL BISULFATE 75 MG PO TABS
75.0000 mg | ORAL_TABLET | Freq: Every day | ORAL | Status: DC
  Administered 2013-07-11 – 2013-07-13 (×3): 75 mg via ORAL
  Filled 2013-07-11 (×6): qty 1

## 2013-07-11 NOTE — Progress Notes (Signed)
Patient ID: Edward Woodward, male   DOB: Dec 21, 1926, 79 y.o.   MRN: [id number]   LOS: 1 day   Subjective: No c/o.   Objective: Vital signs in last 24 hours: Temp:  [97.5 F (36.4 C)-98.2 F (36.8 C)] 97.8 F (36.6 C) (12/16 0516) Pulse Rate:  [64-111] 65 (12/16 0516) Resp:  [11-23] 19 (12/16 0516) BP: (135-181)/(10-109) 135/64 mmHg (12/16 0516) SpO2:  [95 %-100 %] 98 % (12/16 0516) Weight:  [210 lb (95.255 kg)-215 lb 2.7 oz (97.6 kg)] 215 lb 2.7 oz (97.6 kg) (12/15 2139) Last BM Date: 07/10/13   CT No air leak Minimal OP  Laboratory  CBC  Recent Labs  07/10/13 1600 07/11/13 0545  WBC 7.9 7.5  HGB 11.8* 10.7*  HCT 35.4* 32.7*  PLT 168 151   BMET  Recent Labs  07/10/13 1600 07/11/13 0545  NA 140 139  K 3.5 3.8  CL 102 104  CO2 27 22  GLUCOSE 112* 122*  BUN 26* 22  CREATININE 1.28 1.08  CALCIUM 10.2 9.2    Radiology Results PORTABLE CHEST - 1 VIEW  COMPARISON: 07/10/2013.  FINDINGS:  Right chest tube noted in stable anatomic position. Very  questionable tiny right apical pneumothorax is present on today's  exam. Poor inspiration with basilar atelectasis. Prior CABG. Cardiac  pacer noted with lead tips in the right atrium and right ventricle.  Mild pulmonary venous congestion and a questionable tiny left  pleural effusion noted on today's exam. This merits follow-up to  exclude developing congestive heart failure. Degenerative changes  lumbar spine. Prior rotator cuff repair on right.  IMPRESSION:  1. Right chest tube in stable position. A very tiny right apical  pneumothorax cannot be excluded on today's exam.  2. Cardiomegaly with mild pulmonary venous congestion and  questionable tiny left pleural effusion. This merits follow-up to  exclude developing congestive heart failure. No pulmonary edema.  3. Prior CABG. Cardiac pacer noted.  Electronically Signed  By: Maisie Fus Register  On: 07/11/2013 07:29   Physical Exam General appearance: alert  and no distress Resp: Some mild adventitia on right, otherwise clear Cardio: regular rate and rhythm GI: normal findings: bowel sounds normal and soft, non-tender   Assessment/Plan: Fall  Right PTX s/p CT -- Continue suction today, repeat CXR in am ABL anemia -- Mild, follow Multiple medical problems -- Home meds FEN -- SL IV, change pain meds to non-narcotic given age VTE -- SCD's, change SQH to Lovenox Dispo -- Transfer to floor, PT/OT evals    Freeman Caldron, PA-C Pager: [phone] General Trauma PA Pager: [phone]   07/11/2013

## 2013-07-11 NOTE — Clinical Social Work Note (Signed)
Clinical Social Work Department BRIEF PSYCHOSOCIAL ASSESSMENT 07/11/2013  Patient:  Edward Woodward, Edward Woodward     Account Number:  [id number]     Admit date:  07/10/2013  Clinical Social Worker:  Verl Blalock  Date/Time:  07/11/2013 03:00 PM  Referred by:  Physician  Date Referred:  07/11/2013 Referred for  Psychosocial assessment   Other Referral:   Interview type:  Patient Other interview type:   Patient daughter at bedside    PSYCHOSOCIAL DATA Living Status:  WIFE Admitted from facility:   Level of care:   Primary support name:  Anvay, Tennis  [phone] / [phone] Primary support relationship to patient:  SPOUSE Degree of support available:   Strong    CURRENT CONCERNS Current Concerns  None Noted   Other Concerns:    SOCIAL WORK ASSESSMENT / PLAN Clinical Social Worker met with patient and patient daughter at bedside to offer support and discuss patient needs at discharge.  Patient states that he lives at home with his wife who is available 24 hours a day to assist as needed.  Patient states that he was at a concert on Friday night when his legs gave out from underneath him and he partially fell. Part of his right chest landed against a staircase banister.  Patient plans to return home with his wife.  CSW spoke with patient and patient daughter about possible home health needs - patient would like to further discuss with his wife and will update CM regarding agency choice.  CSW to update CM.    Clinical Social Worker unable to complete SBIRT with patient due to patient daughter presence during assessment. CSW to follow up with patient to complete SBIRT at a later time.  CSW remains available for support and to assist with discharge planning as needed.   Assessment/plan status:  Psychosocial Support/Ongoing Assessment of Needs Other assessment/ plan:   Information/referral to community resources:   Clinical Social Worker will refer patient to CM for home health  arrangements.    PATIENT'S/FAMILY'S RESPONSE TO PLAN OF CARE: Patient alert and oriented x3 sitting up in the chair. Patient with limited engagement in assessment process. Patient daughter is employed with [employer] and is going to further discuss with patient wife regarding the possibility of home health.  Patient seems to have a good family support who will assist as needed in patient care.  Patient and family understanding of social work role.

## 2013-07-11 NOTE — Progress Notes (Signed)
No air leakage.  Lung almost completely expanded.  Will keep on suction for next 24 hours and then put on waterseat.  This patient has been seen and I agree with the findings and treatment plan.  Marta Lamas. Gae Bon, MD, FACS [phone] (pager) [phone] (direct pager) Trauma Surgeon

## 2013-07-11 NOTE — Evaluation (Signed)
Physical Therapy Evaluation Patient Details Name: Edward Woodward MRN: [id number] DOB: 16-Jul-1927 Today's Date: 07/11/2013 Time: 4098-1191 PT Time Calculation (min): 27 min  PT Assessment / Plan / Recommendation History of Present Illness  Pt s/p fall sustaining R PNX. pt now with chest tube  Clinical Impression  Pt tolerating OOB mobility well. Pt with generalized weakness and deconditioning. Pt to benefit from HHPT to transition from amb with RW to cane as PTA.    PT Assessment  Patient needs continued PT services    Follow Up Recommendations  Home health PT;Supervision/Assistance - 24 hour    Does the patient have the potential to tolerate intense rehabilitation      Barriers to Discharge        Equipment Recommendations  None recommended by PT    Recommendations for Other Services     Frequency Min 3X/week    Precautions / Restrictions Precautions Precautions: Fall Precaution Comments: R chest tube Restrictions Weight Bearing Restrictions: No   Pertinent Vitals/Pain 4/10 R flank pain at chest tube site      Mobility  Bed Mobility Bed Mobility: Supine to Sit;Sitting - Scoot to Edge of Bed Supine to Sit: 4: Min assist;HOB elevated;With rails Sitting - Scoot to Edge of Bed: 4: Min assist Details for Bed Mobility Assistance: pt with sleep number at home Transfers Transfers: Sit to Stand;Stand to Sit Sit to Stand: 4: Min assist;With upper extremity assist;From bed Stand to Sit: 4: Min assist;With upper extremity assist;To chair/3-in-1 Details for Transfer Assistance: v/c's for anterior weight-shift Ambulation/Gait Ambulation/Gait Assistance: 4: Min assist Ambulation Distance (Feet): 120 Feet Assistive device: Rolling walker Ambulation/Gait Assistance Details: no episodes of LOB, mild R flank pain Gait Pattern: Step-through pattern;Decreased stride length Gait velocity: slow Stairs: No Wheelchair Mobility Wheelchair Mobility: No    Exercises     PT  Diagnosis: Difficulty walking;Generalized weakness  PT Problem List: Decreased strength;Decreased mobility;Decreased activity tolerance PT Treatment Interventions: DME instruction;Gait training;Stair training;Functional mobility training;Therapeutic exercise;Therapeutic activities     PT Goals(Current goals can be found in the care plan section) Acute Rehab PT Goals Patient Stated Goal: home PT Goal Formulation: Patient unable to participate in goal setting Time For Goal Achievement: 07/25/13 Potential to Achieve Goals: Good  Visit Information  Last PT Received On: 07/11/13 Assistance Needed: +1 History of Present Illness: Pt s/p fall sustaining R PNX. pt now with chest tube       Prior Functioning  Home Living Family/patient expects to be discharged to:: Private residence Living Arrangements: Spouse/significant other Available Help at Discharge: Family;Available 24 hours/day Type of Home: House Home Access: Stairs to enter [employer] of Steps: 1 Home Layout: One level Home Equipment: Walker - 2 wheels;Cane - single point Prior Function Level of Independence: Independent with assistive device(s) Comments: uses cane Communication Communication: No difficulties Dominant Hand: Right    Cognition  Cognition Arousal/Alertness: Awake/alert Behavior During Therapy: WFL for tasks assessed/performed Overall Cognitive Status: Within Functional Limits for tasks assessed    Extremity/Trunk Assessment Upper Extremity Assessment Upper Extremity Assessment: Generalized weakness Lower Extremity Assessment Lower Extremity Assessment: Generalized weakness Cervical / Trunk Assessment Cervical / Trunk Assessment: Normal   Balance    End of Session PT - End of Session Equipment Utilized During Treatment: Gait belt Activity Tolerance: Patient tolerated treatment well Patient left: in chair;with call bell/phone within reach;with family/visitor present Nurse Communication:  Mobility status  GP     Marcene Brawn 07/11/2013, 11:17 AM   Lewis Shock, PT, DPT  Pager #: [phone] Office #: [phone]

## 2013-07-11 NOTE — Progress Notes (Signed)
Patient arrived to 4N, patient placed on 2L 02 and RR RN provided suction tubing to floor RN for chest tube, suction set up at 20. Patient with mild soreness to ride side, alert and oriented, wife and daughter at bedside, bed alarm on and urinal and call light at bedside.

## 2013-07-11 NOTE — ED Provider Notes (Signed)
Medical screening examination/treatment/procedure(s) were conducted as a shared visit with non-physician practitioner(s) or resident  and myself.    I have personally reviewed any xrays and/ or EKG's with the provider and I agree with interpretation.   Pneumothorax transferred from [facility].  No distress.  No deviation of trachea or retractions. RRR. Trauma consulted for chest tube placement and admission. No resp distress Filed Vitals:   07/10/13 2100 07/10/13 2139 07/10/13 2230 07/10/13 2315  BP: 160/89 181/98  180/75  Pulse: 110 110 64 64  Temp:  97.5 F (36.4 C)  97.7 F (36.5 C)  TempSrc:  Oral  Oral  Resp: 11 17 19 19   Height:  5\' 10"  (1.778 m)    Weight:  215 lb 2.7 oz (97.6 kg)    SpO2: 100% 100% 99% 100%   Pneumothorax, right  Enid Skeens, MD 07/11/13 [phone]

## 2013-07-11 NOTE — Progress Notes (Signed)
Pt transferred from ED with RN on monitor, Ax4, able to move all extremities. Chest tube placed to 20cm suction, pt on 2L O2. Pt VSS, stated he is having 9/10 pain to right thoracic area near chest tube, will given pain med and reassess. Family at bedside and updated.

## 2013-07-11 NOTE — Progress Notes (Signed)
Patient has been transferred to 4N bed 13, via wheelchair. Phone report has been called to [employer]. Patient and wife are aware of the transfer.

## 2013-07-11 NOTE — Progress Notes (Signed)
UR completed.  Shanda Cadotte, RN BSN M[facility] CCM Trauma/Neuro ICU Case Manager [phone]  

## 2013-07-12 ENCOUNTER — Inpatient Hospital Stay (HOSPITAL_COMMUNITY): Payer: Medicare Other

## 2013-07-12 DIAGNOSIS — J9383 Other pneumothorax: Secondary | ICD-10-CM | POA: Diagnosis not present

## 2013-07-12 DIAGNOSIS — D62 Acute posthemorrhagic anemia: Secondary | ICD-10-CM | POA: Diagnosis not present

## 2013-07-12 DIAGNOSIS — S270XXA Traumatic pneumothorax, initial encounter: Secondary | ICD-10-CM | POA: Diagnosis not present

## 2013-07-12 LAB — CBC
HCT: 33.4 % — ABNORMAL LOW (ref 39.0–52.0)
Hemoglobin: 11.1 g/dL — ABNORMAL LOW (ref 13.0–17.0)
MCHC: 33.2 g/dL (ref 30.0–36.0)
MCV: 94.1 fL (ref 78.0–100.0)
Platelets: 143 10*3/uL — ABNORMAL LOW (ref 150–400)
RBC: 3.55 MIL/uL — ABNORMAL LOW (ref 4.22–5.81)
RDW: 13.9 % (ref 11.5–15.5)
WBC: 6.8 10*3/uL (ref 4.0–10.5)

## 2013-07-12 MED ORDER — BISACODYL 5 MG PO TBEC
10.0000 mg | DELAYED_RELEASE_TABLET | Freq: Every day | ORAL | Status: DC | PRN
  Administered 2013-07-12 – 2013-07-13 (×2): 10 mg via ORAL
  Filled 2013-07-12 (×2): qty 2

## 2013-07-12 NOTE — Progress Notes (Signed)
Patient wearing home machine with 2lpm 02 bleed in

## 2013-07-12 NOTE — Clinical Social Work Note (Signed)
Clinical Social Worker continuing to follow patient and family for support and discharge planning needs.  CSW attempted to meet with patient again regarding SBIRT completion - patient sound asleep and patient wife requested not to wake patient at this time.  CSW to attempt again to complete SBIRT with patient tomorrow.  CSW remains available for support as needed.  Macario Golds, [place] [phone]

## 2013-07-12 NOTE — Progress Notes (Signed)
This patient has been seen and I agree with the findings and treatment plan.  Anelle Parlow O. Jalyn Dutta, III, MD, FACS [phone] (pager) [phone] (direct pager) Trauma Surgeon  

## 2013-07-12 NOTE — Progress Notes (Signed)
Patient ID: Edward Woodward, male   DOB: 1927/07/13, 79 y.o.   MRN: [id number]   LOS: 2 days   Subjective: No c/o.   Objective: Vital signs in last 24 hours: Temp:  [97.9 F (36.6 C)-98.9 F (37.2 C)] 98.6 F (37 C) (12/17 0558) Pulse Rate:  [65-68] 68 (12/17 0558) Resp:  [18-20] 18 (12/17 0558) BP: (124-148)/(62-71) 148/71 mmHg (12/17 0558) SpO2:  [96 %-99 %] 97 % (12/17 0558) Last BM Date: 07/09/13   CT  No air leak  Minimal OP   Laboratory  CBC  Recent Labs  07/11/13 0545 07/12/13 0605  WBC 7.5 6.8  HGB 10.7* 11.1*  HCT 32.7* 33.4*  PLT 151 143*    Radiology Results PORTABLE CHEST - 1 VIEW  COMPARISON: July 11, 2013  FINDINGS:  Chest tube is again noted on the right. Tiny right apical  pneumothorax persists without change.  There is no edema or consolidation. There is evidence of a degree of  underlying emphysema. Heart is mildly enlarged with normal pulmonary  vascularity. Pacemaker leads are attached to the right atrium and  right ventricle. Patient is status post coronary artery bypass  grafting.  IMPRESSION:  Stable tiny apical pneumothorax on the right. Chest tube on the  right remains in place. No edema or consolidation. Evidence of a  degree of underlying emphysematous change.  Electronically Signed  By: Bretta Bang M.D.  On: 07/12/2013 08:01   Physical Exam General appearance: alert and no distress Resp: clear to auscultation bilaterally Cardio: regular rate and rhythm GI: normal findings: bowel sounds normal and soft, non-tender   Assessment/Plan: Fall  Right PTX s/p CT -- Water seal today, repeat CXR in am  ABL anemia -- Improved Multiple medical problems -- Home meds  FEN -- No issues VTE -- SCD's, Lovenox  Dispo -- CT    Freeman Caldron, PA-C Pager: [phone] General Trauma PA Pager: [phone]   07/12/2013

## 2013-07-12 NOTE — Progress Notes (Signed)
Physical Therapy Treatment Patient Details Name: Edward Woodward MRN: [id number] DOB: [birth year] Today's Date: 07/12/2013 Time: 1308-6578 PT Time Calculation (min): 28 min  PT Assessment / Plan / Recommendation  History of Present Illness Pt s/p fall sustaining R PNX. pt now with chest tube   PT Comments   Pt very motivated to increase ambulation distance.  Pt indicates only pain around chest tube.  Pt set-up with basin to wash up with wife presents and RN aware.    Follow Up Recommendations  Home health PT;Supervision/Assistance - 24 hour     Does the patient have the potential to tolerate intense rehabilitation     Barriers to Discharge        Equipment Recommendations  None recommended by PT    Recommendations for Other Services    Frequency Min 3X/week   Progress towards PT Goals Progress towards PT goals: Progressing toward goals  Plan Current plan remains appropriate    Precautions / Restrictions Precautions Precautions: Fall Precaution Comments: R chest tube Restrictions Weight Bearing Restrictions: No   Pertinent Vitals/Pain Pain around chest tube 5/10 during mobility.      Mobility  Bed Mobility Bed Mobility: Supine to Sit;Sitting - Scoot to Edge of Bed Supine to Sit: 4: Min assist;With rails;HOB elevated Sitting - Scoot to [place] of Bed: 5: Supervision Details for Bed Mobility Assistance: pt attempted to come to sitting on his own, but needed MinA to bring trunk up to sitting.   Transfers Transfers: Sit to Stand;Stand to Sit Sit to Stand: 4: Min guard;With upper extremity assist;From bed Stand to Sit: 4: Min guard;With upper extremity assist;To chair/3-in-1 Details for Transfer Assistance: cues for UE use and controlling descent to chair.   Ambulation/Gait Ambulation/Gait Assistance: 4: Min guard Ambulation Distance (Feet): 500 Feet Assistive device: Rolling walker Ambulation/Gait Assistance Details: pt very motivated to increase ambulation distance.  pt  does rely on RW, but states he feels he is moving easier today.   Gait Pattern: Step-through pattern;Decreased stride length Stairs: No Wheelchair Mobility Wheelchair Mobility: No    Exercises     PT Diagnosis:    PT Problem List:   PT Treatment Interventions:     PT Goals (current goals can now be found in the care plan section) Acute Rehab PT Goals Patient Stated Goal: home Time For Goal Achievement: 07/25/13 Potential to Achieve Goals: Good  Visit Information  Last PT Received On: 07/12/13 Assistance Needed: +1 History of Present Illness: Pt s/p fall sustaining R PNX. pt now with chest tube    Subjective Data  Patient Stated Goal: home   Cognition  Cognition Arousal/Alertness: Awake/alert Behavior During Therapy: WFL for tasks assessed/performed Overall Cognitive Status: Within Functional Limits for tasks assessed    Balance  Balance Balance Assessed: No  End of Session PT - End of Session Equipment Utilized During Treatment: Oxygen Activity Tolerance: Patient tolerated treatment well Patient left: in chair;with call bell/phone within reach;with family/visitor present Nurse Communication: Mobility status   GP     Sunny Schlein, [place] [phone] 07/12/2013, 10:17 AM

## 2013-07-12 NOTE — Evaluation (Addendum)
Occupational Therapy Evaluation Patient Details Name: Edward Woodward MRN: [id number] DOB: 1927/05/22 Today's Date: 07/12/2013 Time: 9629-5284 OT Time Calculation (min): 24 min  OT Assessment / Plan / Recommendation History of present illness Pt s/p fall sustaining R PNX. pt now with chest tube   Clinical Impression   Pt presents with below problem list. Pt will benefit from acute OT to increase independence prior to d/c. Recommending HHOT upon d/c.     OT Assessment  Patient needs continued OT Services    Follow Up Recommendations  Home health OT;Supervision/Assistance - 24 hour    Barriers to Discharge      Equipment Recommendations  may benefit from 2 wheeled walker (has rollator)   Recommendations for Other Services    Frequency  Min 2X/week    Precautions / Restrictions Precautions Precautions: Fall Precaution Comments: R chest tube Restrictions Weight Bearing Restrictions: No   Pertinent Vitals/Pain Pain 10/10 in chest area while moving. Increased activity. No pain at end of session.     ADL  Upper Body Dressing: Set up;Supervision/safety Where Assessed - Upper Body Dressing: Unsupported sitting Lower Body Dressing: Minimal assistance Where Assessed - Lower Body Dressing: Supported sit to stand Toilet Transfer: Minimal assistance Toilet Transfer Method: Sit to Barista: Comfort height toilet;Grab bars Tub/Shower Transfer Method: Not assessed Equipment Used: Gait belt;Reacher;Long-handled sponge;Long-handled shoe horn;Rolling walker;Sock aid Transfers/Ambulation Related to ADLs: Min guard for ambulation and Min A/Min guard for sit <> stand transfers. ADL Comments: Educated on AE for LB ADLs as well as dressing technique. Educated to sit to bathe and also to sit for LB dressing and to stand in front of chair/bed with walker in front when pulling up LB clothing.  Recommended wife pick up rugs in house (non skid in bathroom). Educated on not  trying to carry items but to have hands on walker (could use bag on walker to carry items). OT demonstrated shower transfer technique.    OT Diagnosis: Acute pain;Generalized weakness  OT Problem List: Decreased strength;Decreased activity tolerance;Impaired balance (sitting and/or standing);Decreased range of motion;Decreased knowledge of precautions;Decreased knowledge of use of DME or AE;Pain OT Treatment Interventions: Self-care/ADL training;DME and/or AE instruction;Therapeutic activities;Balance training;Patient/family education   OT Goals(Current goals can be found in the care plan section) Acute Rehab OT Goals Patient Stated Goal: not stated OT Goal Formulation: With patient Time For Goal Achievement: 07/19/13 Potential to Achieve Goals: Good ADL Goals Pt Will Perform Lower Body Bathing: with modified independence;sit to/from stand Pt Will Perform Lower Body Dressing: with modified independence;sit to/from stand Pt Will Transfer to Toilet: with modified independence;ambulating;grab bars (comfort height toilet) Pt Will Perform Toileting - Clothing Manipulation and hygiene: with modified independence;sit to/from stand Pt Will Perform Tub/Shower Transfer: with supervision;ambulating;rolling walker;shower seat Additional ADL Goal #1: Pt will perform bed mobility at Mod I level as precursor for ADLs.    Visit Information  Last OT Received On: 07/12/13 Assistance Needed: +1 History of Present Illness: Pt s/p fall sustaining R PNX. pt now with chest tube       Prior Functioning     Home Living Family/patient expects to be discharged to:: Private residence Living Arrangements: Spouse/significant other Available Help at Discharge: Family;Available 24 hours/day Type of Home: House Home Access: Stairs to enter [employer] of Steps: 1 Home Layout: One level Home Equipment: Cane - single point;Shower seat;Grab bars - toilet;Walker - 4 wheels Prior Function Level of  Independence: Needs assistance ADL's / Homemaking Assistance Needed: wife assisted some  with LB dressing Communication Communication: No difficulties Dominant Hand: Right         Vision/Perception     Cognition  Cognition Arousal/Alertness: Awake/alert Behavior During Therapy: WFL for tasks assessed/performed Overall Cognitive Status: Within Functional Limits for tasks assessed    Extremity/Trunk Assessment Upper Extremity Assessment Upper Extremity Assessment: Overall WFL for tasks assessed Lower Extremity Assessment Lower Extremity Assessment: Defer to PT evaluation     Mobility Bed Mobility Bed Mobility: Supine to Sit;Sitting - Scoot to Edge of Bed Supine to Sit: With rails;3: Mod assist Sitting - Scoot to Edge of Bed: 4: Min guard Details for Bed Mobility Assistance: Assisted with trunk Transfers Transfers: Sit to Stand;Stand to Sit Sit to Stand: With upper extremity assist;From chair/3-in-1;From bed;From toilet;4: Min assist Stand to Sit: 4: Min assist;To toilet;To chair/3-in-1;4: Min guard Details for Transfer Assistance: Assist to come to standing. Cues for hand placement.     Exercise     Balance     End of Session OT - End of Session Equipment Utilized During Treatment: Gait belt;Rolling walker;Other (comment) (chest tube) Activity Tolerance: Patient tolerated treatment well;Patient limited by pain Patient left: in chair;with call bell/phone within reach;with family/visitor present Nurse Communication: Other (comment) (assisted with chest tube)  GO     Earlie Raveling OTR/L [phone] 07/12/2013, 5:45 PM

## 2013-07-13 ENCOUNTER — Inpatient Hospital Stay (HOSPITAL_COMMUNITY): Payer: Medicare Other

## 2013-07-13 DIAGNOSIS — Z950 Presence of cardiac pacemaker: Secondary | ICD-10-CM | POA: Diagnosis not present

## 2013-07-13 DIAGNOSIS — J984 Other disorders of lung: Secondary | ICD-10-CM | POA: Diagnosis not present

## 2013-07-13 DIAGNOSIS — D62 Acute posthemorrhagic anemia: Secondary | ICD-10-CM | POA: Diagnosis not present

## 2013-07-13 DIAGNOSIS — R9389 Abnormal findings on diagnostic imaging of other specified body structures: Secondary | ICD-10-CM | POA: Diagnosis not present

## 2013-07-13 DIAGNOSIS — S270XXA Traumatic pneumothorax, initial encounter: Secondary | ICD-10-CM | POA: Diagnosis not present

## 2013-07-13 MED ORDER — TRAMADOL HCL 50 MG PO TABS: 50.0000 mg | ORAL_TABLET | Freq: Four times a day (QID) | ORAL | Status: DC | PRN

## 2013-07-13 NOTE — Progress Notes (Signed)
Unfortunately the CXR ordered for 1100 was done at 0940.  Ask the tech to come back at noon to repeat.  First film was okay, but not enough time after CT was pulled..  Possibly can go home today.  This patient has been seen and I agree with the findings and treatment plan.  Marta Lamas. Gae Bon, MD, FACS [phone] (pager) [phone] (direct pager) Trauma Surgeon

## 2013-07-13 NOTE — Progress Notes (Signed)
Patient discharge education and paperwork completed.  IV removed.  Patient driven home via wife.  Lance Bosch, RN

## 2013-07-13 NOTE — Discharge Summary (Signed)
Physician Discharge Summary  Patient ID: Edward Woodward MRN: [id number] DOB/AGE: 03/13/1927 79 y.o.  Admit date: 07/10/2013 Discharge date: 07/13/2013  Discharge Diagnoses Patient Active Problem List   Diagnosis Date Noted  . Fall 07/11/2013  . Pneumothorax, traumatic 07/11/2013  . Dyspnea 07/10/2013  . OSA on CPAP 07/10/2013  . Peripheral vascular disease 06/07/2013  . Coronary atherosclerosis of native coronary artery 06/07/2013  . Other and unspecified hyperlipidemia 06/07/2013  . Essential hypertension, benign 06/07/2013  . Other malignant lymphomas, unspecified site, extranodal and solid organ sites 07/03/2011  . Acute blood loss anemia 07/03/2011    Consultants None   Procedures Right tube thoracostomy by Dr. Gaynelle Adu   HPI: Edward Woodward was at a concert on Friday night when his legs gave out from underneath him and he partially fell. He was caught by bystanders so didn't completely fall. Part of his right chest landed against a staircase banister. The following morning he awoke with shortness of breath. He denied loss of consciousness.  His shortness of breath persisted so he contacted his cardiologist's office who ordered an outpatient chest x-ray which demonstrated a right pneumothorax. He was sent to [facility] and was transferred to [facility] for chest tube placement and admission.    Hospital Course: The patient's chest tube was able to be weaned and removed without complication. His pain was controlled on oral medications. He was mobilized with physical and occupational therapies who recommended home with home health. He did suffer a fall off of the toilet on the day of discharge but was a bit of a freak accident and in discussion with the patient and family did not feel it needed to impact his discharge plan. He was discharged in stable condition.      Medication List         aspirin 325 MG tablet  Take 325 mg by mouth daily.     ATORVASTATIN CALCIUM PO   Take by mouth every other day. Unsure of dose     b complex vitamins tablet  Take 1 tablet by mouth daily.     citalopram 20 MG tablet  Commonly known as:  CELEXA  Take 20 mg by mouth daily.     clopidogrel 75 MG tablet  Commonly known as:  PLAVIX  Take 75 mg by mouth daily.     docusate sodium 100 MG capsule  Commonly known as:  COLACE  Take 100 mg by mouth 2 (two) times daily.     furosemide 40 MG tablet  Commonly known as:  LASIX  Take 40-80 mg by mouth daily. Take 40 mg or  80 mg every day. Alternate doses.     gabapentin 300 MG capsule  Commonly known as:  NEURONTIN  Take 300 mg by mouth 3 (three) times daily.     hydroxypropyl methylcellulose 2.5 % ophthalmic solution  Commonly known as:  ISOPTO TEARS  1 drop.     isosorbide mononitrate 30 MG 24 hr tablet  Commonly known as:  IMDUR  Take 30 mg by mouth daily.     MELATONIN PO  Take 1 tablet by mouth at bedtime.     nitroGLYCERIN 0.4 MG SL tablet  Commonly known as:  NITROSTAT  Place 0.4 mg under the tongue every 5 (five) minutes as needed for chest pain.     traMADol 50 MG tablet  Commonly known as:  ULTRAM  Take 1-2 tablets (50-100 mg total) by mouth every 6 (six) hours as needed (Pain).  UBIQUINOL PO  Take 1 tablet by mouth daily. CoQ10             Follow-up Information   Follow up with [facility] On 07/25/2013. (10:00AM for suture removal)    Contact information:   [address] [place] [address] [phone]       Signed: Freeman Caldron, PA-C Pager: [phone] General Trauma PA Pager: [phone]  07/13/2013, 3:31 PM

## 2013-07-13 NOTE — Progress Notes (Addendum)
Upon this RN's intial assessment of pt., pt. Found on the floor in the bathroom. Pt.'s daughter who is an Charity fundraiser here and the pt.'s wife took pt. to the bathroom and per the patient he decided to adjust his bottom on the toilet and fell off the side of the toilet; small skin tear noted to right elbow and right leg; no bleeding noted. Pt. Denies hitting his head. Denies pain. Re-educated family that only staff is to assist the patient to the bathroom. No other injuries noted. Earney Hamburg, PA notified.

## 2013-07-13 NOTE — Progress Notes (Signed)
Social visit

## 2013-07-13 NOTE — Progress Notes (Signed)
Patient ID: Edward Woodward, male   DOB: 27-Jul-1927, 79 y.o.   MRN: [id number]   LOS: 3 days   Subjective: No new c/o.   Objective: Vital signs in last 24 hours: Temp:  [97.5 F (36.4 C)-98 F (36.7 C)] 97.5 F (36.4 C) (12/18 0601) Pulse Rate:  [65-68] 67 (12/18 0601) Resp:  [18] 18 (12/18 0601) BP: (115-173)/(56-93) 171/70 mmHg (12/18 0601) SpO2:  [95 %-98 %] 95 % (12/18 0601) Last BM Date: 07/09/13   CT  No air leak  Minimal OP   Radiology Results PORTABLE CHEST - 1 VIEW  COMPARISON: 07/12/2013  FINDINGS:  Cardiac shadow is stable. A pacing device is again noted. A right  chest tube is again seen and stable. The previously seen tiny right  apical pneumothorax has resolved in the interval. No focal  infiltrate or sizable effusion is seen. No bony abnormality is  noted.  IMPRESSION:  Resolution of tiny apical pneumothorax.  Electronically Signed  By: Alcide Clever M.D.  On: 07/13/2013 07:49   Physical Exam General appearance: alert and no distress Resp: clear to auscultation bilaterally Cardio: regular rate and rhythm GI: normal findings: bowel sounds normal and soft, non-tender   Assessment/Plan: Fall  Right PTX s/p CT -- D/C chest tube ABL anemia -- Improved  Multiple medical problems -- Home meds  FEN -- No issues  VTE -- SCD's, Lovenox  Dispo -- Home this afternoon if CXR ok    Freeman Caldron, PA-C Pager: [phone] General Trauma PA Pager: [phone]   07/13/2013

## 2013-07-13 NOTE — Progress Notes (Signed)
Spoke with pt about HH needs and DME needs. He reports having a rolling walker at home to use and no further DME needs.  He wishes to have HHPT but NOT occupational therapy.  He said he was uncomfortable having multiple people come into the home.HHPT arranged with [facility] per pt choice as he already uses them for the facemasks for his breathing machine.   He confirmed that the address and phone number on the facesheet are correct.

## 2013-07-15 DIAGNOSIS — IMO0001 Reserved for inherently not codable concepts without codable children: Secondary | ICD-10-CM | POA: Diagnosis not present

## 2013-07-15 DIAGNOSIS — F329 Major depressive disorder, single episode, unspecified: Secondary | ICD-10-CM | POA: Diagnosis not present

## 2013-07-15 DIAGNOSIS — M48061 Spinal stenosis, lumbar region without neurogenic claudication: Secondary | ICD-10-CM | POA: Diagnosis not present

## 2013-07-15 DIAGNOSIS — I10 Essential (primary) hypertension: Secondary | ICD-10-CM | POA: Diagnosis not present

## 2013-07-15 DIAGNOSIS — I2510 Atherosclerotic heart disease of native coronary artery without angina pectoris: Secondary | ICD-10-CM | POA: Diagnosis not present

## 2013-07-15 DIAGNOSIS — Z9181 History of falling: Secondary | ICD-10-CM | POA: Diagnosis not present

## 2013-07-15 DIAGNOSIS — G609 Hereditary and idiopathic neuropathy, unspecified: Secondary | ICD-10-CM | POA: Diagnosis not present

## 2013-07-18 ENCOUNTER — Telehealth: Payer: Self-pay | Admitting: *Deleted

## 2013-07-18 DIAGNOSIS — IMO0001 Reserved for inherently not codable concepts without codable children: Secondary | ICD-10-CM | POA: Diagnosis not present

## 2013-07-18 DIAGNOSIS — G609 Hereditary and idiopathic neuropathy, unspecified: Secondary | ICD-10-CM | POA: Diagnosis not present

## 2013-07-18 DIAGNOSIS — I2510 Atherosclerotic heart disease of native coronary artery without angina pectoris: Secondary | ICD-10-CM | POA: Diagnosis not present

## 2013-07-18 DIAGNOSIS — M48061 Spinal stenosis, lumbar region without neurogenic claudication: Secondary | ICD-10-CM | POA: Diagnosis not present

## 2013-07-18 DIAGNOSIS — I10 Essential (primary) hypertension: Secondary | ICD-10-CM | POA: Diagnosis not present

## 2013-07-18 DIAGNOSIS — F329 Major depressive disorder, single episode, unspecified: Secondary | ICD-10-CM | POA: Diagnosis not present

## 2013-07-18 NOTE — Telephone Encounter (Signed)
Concerned that his 06/02/13 PSA went up to 9.04 and is asking if it should be checked sooner than 4 months (March)? Asking if we need to take any action? Asking to see Dr. Truett Perna sooner if he feels it is appropriate. Thought his urologist said his PSA needs to stay < 2.0. Made him aware this may have been a past goal, but is not a realistic expectation at this point. Will make Dr. Truett Perna aware of his concern and be sure Dr. Earlene Plater (urology) gets copy of result also.

## 2013-07-19 ENCOUNTER — Telehealth: Payer: Self-pay

## 2013-07-19 ENCOUNTER — Other Ambulatory Visit: Payer: Self-pay

## 2013-07-19 ENCOUNTER — Telehealth: Payer: Self-pay | Admitting: Interventional Cardiology

## 2013-07-19 MED ORDER — NITROGLYCERIN 0.4 MG SL SUBL: 0.4000 mg | SUBLINGUAL_TABLET | SUBLINGUAL | Status: DC | PRN

## 2013-07-19 NOTE — Telephone Encounter (Signed)
I spoke with pt. He is feeling better since discharge from the hospital with a pneumothorax. Advised pt he could take [place], plain Robitussin for his cold. Mylo Red RN

## 2013-07-19 NOTE — Telephone Encounter (Signed)
New problem    Pt's wife called about prescription for Ntrolingual pump spray [address] per spray to be called into  [facility] on [address]. [place].

## 2013-07-19 NOTE — Telephone Encounter (Signed)
error 

## 2013-07-19 NOTE — Telephone Encounter (Signed)
Notified wife that Dr. Truett Perna said he needs to follow up with Dr. Earlene Plater for management of PSA. He will be glad to see him prior to March if he wishes. She will follow up again with Dr. Earlene Plater and let Dr. Truett Perna know if she needs to see him sooner.

## 2013-07-19 NOTE — Telephone Encounter (Signed)
New problem    Pt's wife called about pt having a cold and would like to know what decongestants she can get for him.  Plus pt has a collapsed lung.   Please give her a call back.

## 2013-07-24 ENCOUNTER — Ambulatory Visit (INDEPENDENT_AMBULATORY_CARE_PROVIDER_SITE_OTHER): Payer: Medicare Other | Admitting: Family Medicine

## 2013-07-24 ENCOUNTER — Ambulatory Visit: Payer: Medicare Other

## 2013-07-24 VITALS — BP 152/72 | HR 69 | Temp 98.0°F | Resp 16 | Ht 70.0 in | Wt 216.0 lb

## 2013-07-24 DIAGNOSIS — Z8709 Personal history of other diseases of the respiratory system: Secondary | ICD-10-CM

## 2013-07-24 DIAGNOSIS — R05 Cough: Secondary | ICD-10-CM

## 2013-07-24 DIAGNOSIS — R509 Fever, unspecified: Secondary | ICD-10-CM | POA: Diagnosis not present

## 2013-07-24 DIAGNOSIS — J209 Acute bronchitis, unspecified: Secondary | ICD-10-CM | POA: Diagnosis not present

## 2013-07-24 LAB — POCT CBC
Granulocyte percent: 58 %G (ref 37–80)
HCT, POC: 40.6 % — AB (ref 43.5–53.7)
MCH, POC: 30.1 pg (ref 27–31.2)
MCV: 96.9 fL (ref 80–97)
MID (cbc): 0.8 (ref 0–0.9)
POC Granulocyte: 5 (ref 2–6.9)
POC LYMPH PERCENT: 32.8 %L (ref 10–50)
Platelet Count, POC: 178 10*3/uL (ref 142–424)
RDW, POC: 15.2 %

## 2013-07-24 LAB — POCT INFLUENZA A/B: Influenza B, POC: NEGATIVE

## 2013-07-24 MED ORDER — DOXYCYCLINE HYCLATE 100 MG PO CAPS: 100.0000 mg | ORAL_CAPSULE | Freq: Two times a day (BID) | ORAL | Status: DC

## 2013-07-24 MED ORDER — BENZONATATE 100 MG PO CAPS: 100.0000 mg | ORAL_CAPSULE | Freq: Three times a day (TID) | ORAL | Status: DC | PRN

## 2013-07-24 NOTE — Progress Notes (Signed)
[facility] [address] [place] [address] [phone]  Date:  07/24/2013   Name:  Edward Woodward   DOB:  11-16-1926   MRN:  [id number]  PCP:  Ginette Otto, MD    Chief Complaint: Cough   History of Present Illness:  Edward Woodward is a 79 y.o. very pleasant male patient who presents with the following:  This gentleman was admitted from 12/15- 12/18 with a pneumothorax following a fall. He had a right chest tube which was removed prior to discharge.  His last CXR showed resolution of the pneumothorax 12/18.    PORTABLE CHEST - 1 VIEW  COMPARISON: Chest x-ray of earlier today at 5:58 a.m.  FINDINGS:  The chest tube on the right has been removed. There is no evidence  of a pneumothorax nor pleural effusion. The right lung is  well-expanded. There are coarse lung markings in the infrahilar  region on the right which are not clearly new. There are stable tiny  nodules noted at the right lung base measuring up to 2 mm in  diameter. The cardiac silhouette is top-normal in size. The  pulmonary vascularity is not engorged. The permanent pacemaker is  unchanged in position. The bony thorax exhibits no acute  abnormality.  IMPRESSION:  1. There is no evidence of a pneumothorax or pleural effusion post  right-sided chest tube removal.  2. There is no evidence of pneumonia nor CHF.  3. Coarse lung markings and tiny nodule at the right lung base are  stable.   Following his discharge he did well until he got ill on 07/18/13 with cough and fever.  His temp has been up to 101.4 when they have checked it at home.   He has noted a fever every night up until last night. However last night he was up coughing all night.  He has not been able to use his CPAP machine very easily He has noted a runny nose, aches, fatigue and chills  He is using OTC medications as needed for his cold   No history of CHF, no orthopnea.  He has some baseline sweling of his LE  but nothing unusual He does history of CAD/ CABG and pacemaker in place.  Patient Active Problem List   Diagnosis Date Noted  . Fall 07/11/2013  . Pneumothorax, traumatic 07/11/2013  . Dyspnea 07/10/2013  . OSA on CPAP 07/10/2013  . Peripheral vascular disease 06/07/2013  . Coronary atherosclerosis of native coronary artery 06/07/2013  . Other and unspecified hyperlipidemia 06/07/2013  . Essential hypertension, benign 06/07/2013  . Other malignant lymphomas, unspecified site, extranodal and solid organ sites 07/03/2011  . Acute blood loss anemia 07/03/2011    Past Medical History  Diagnosis Date  . Cancer   . Prostate cancer   . Neuropathy   . Hypercholesteremia   . Depressive disorder, not elsewhere classified   . Angina pectoris   . Sinoatrial node dysfunction   . Degeneration of lumbar or lumbosacral intervertebral disc   . Dyspnea   . CAD (coronary artery disease)      NYHA Class 1B, CABG 1985  . HTN (hypertension)   . OSA on CPAP     Past Surgical History  Procedure Laterality Date  . Coronary artery bypass graft    . Exploratory laparotomy    . Endarterectomy    . Rotator cuff repair    . Lumbar fusion    . Coronary angioplasty with stent placement  s/p bare metal stent implant SVG-diagonal 11/23/05, s/p BM stent implantation SVG diagonal 10/22/06 (feeds LAD), and 05/2010 with DES to left circumflex  . Pacemaker insertion      History  Substance Use Topics  . Smoking status: Never Smoker   . Smokeless tobacco: Not on file  . Alcohol Use: No    No family history on file.  Allergies  Allergen Reactions  . Penicillins Swelling    Medication list has been reviewed and updated.  Current Outpatient Prescriptions on File Prior to Visit  Medication Sig Dispense Refill  . aspirin 325 MG tablet Take 325 mg by mouth daily.        . ATORVASTATIN CALCIUM PO Take by mouth every other day. Unsure of dose      . b complex vitamins tablet Take 1 tablet by mouth  daily.        . citalopram (CELEXA) 20 MG tablet Take 20 mg by mouth daily.        . clopidogrel (PLAVIX) 75 MG tablet Take 75 mg by mouth daily.        Marland Kitchen docusate sodium (COLACE) 100 MG capsule Take 100 mg by mouth 2 (two) times daily.        . furosemide (LASIX) 40 MG tablet Take 40-80 mg by mouth daily. Take 40 mg or  80 mg every day. Alternate doses.      Marland Kitchen gabapentin (NEURONTIN) 300 MG capsule Take 300 mg by mouth 3 (three) times daily.       . hydroxypropyl methylcellulose (ISOPTO TEARS) 2.5 % ophthalmic solution 1 drop.      . isosorbide mononitrate (IMDUR) 30 MG 24 hr tablet Take 30 mg by mouth daily.      . nitroGLYCERIN (NITROSTAT) 0.4 MG SL tablet Place 1 tablet (0.4 mg total) under the tongue every 5 (five) minutes as needed for chest pain.  25 tablet  3  . Nutritional Supplements (MELATONIN PO) Take 1 tablet by mouth at bedtime.        . traMADol (ULTRAM) 50 MG tablet Take 1-2 tablets (50-100 mg total) by mouth every 6 (six) hours as needed (Pain).  30 tablet  0  . UBIQUINOL PO Take 1 tablet by mouth daily. CoQ10       No current facility-administered medications on file prior to visit.    Review of Systems:  As per HPI- otherwise negative.   Physical Examination: Filed Vitals:   07/24/13 0946  BP: 152/72  Pulse: 69  Temp: 98 F (36.7 C)  Resp: 16   Filed Vitals:   07/24/13 0946  Height: 5\' 10"  (1.778 m)  Weight: 216 lb (97.977 kg)   Body mass index is 30.99 kg/(m^2). Ideal Body Weight: Weight in (lb) to have BMI = 25: 173.9  GEN: WDWN, NAD, Non-toxic, A & O x 3, obese, here today with his wife HEENT: Atraumatic, Normocephalic. Neck supple. No masses, No LAD.  Bilateral TM wnl, oropharynx normal.  PEERL,EOMI.   Ears and Nose: No external deformity. CV: RRR, No M/G/R. No JVD. No thrill. No extra heart sounds. PULM: CTA B, no wheezes, crackles, rhonchi. No retractions. No resp. distress. No accessory muscle use. ABD: S, NT, ND, +BS. No rebound. No HSM. EXTR: No  c/c/e NEURO Normal gait.  PSYCH: Normally interactive. Conversant. Not depressed or anxious appearing.  Calm demeanor.  Well- healing area on right side of chest from chest tube  [facility] reading (PRIMARY) by  Dr. Patsy Lager. CXR: negative, pacemaker in  place  CHEST 2 VIEW  COMPARISON: 07/13/2013  FINDINGS: A left-sided pacing device is again seen. Postsurgical changes are noted. The cardiac shadow is stable. The lungs are well aerated bilaterally without evidence of pneumothorax. Multiple calcified granulomas are again identified. No focal infiltrate is seen.  IMPRESSION: Old granulomatous disease.  No recurrent pneumothorax is noted.  Results for orders placed in visit on 07/24/13  POCT INFLUENZA A/B      Result Value Range   Influenza A, POC Negative     Influenza B, POC Negative    POCT CBC      Result Value Range   WBC 8.6  4.6 - 10.2 K/uL   Lymph, poc 2.8  0.6 - 3.4   POC LYMPH PERCENT 32.8  10 - 50 %L   MID (cbc) 0.8  0 - 0.9   POC MID % 9.2  0 - 12 %M   POC Granulocyte 5.0  2 - 6.9   Granulocyte percent 58.0  37 - 80 %G   RBC 4.19 (*) 4.69 - 6.13 M/uL   Hemoglobin 12.6 (*) 14.1 - 18.1 g/dL   HCT, POC 47.8 (*) 29.5 - 53.7 %   MCV 96.9  80 - 97 fL   MCH, POC 30.1  27 - 31.2 pg   MCHC 31.0 (*) 31.8 - 35.4 g/dL   RDW, POC 62.1     Platelet Count, POC 178  142 - 424 K/uL   MPV 9.7  0 - 99.8 fL    Assessment and Plan: Acute bronchitis - Plan: doxycycline (VIBRAMYCIN) 100 MG capsule, benzonatate (TESSALON) 100 MG capsule  Cough - Plan: POCT Influenza A/B, DG Chest 2 View, POCT CBC  Fever - Plan: POCT Influenza A/B, DG Chest 2 View, POCT CBC  History of pneumothorax  Treat with doxycycline and tessalon for persistent cough.  He can also try his tramadol which he was given from his hospital stay; as an opoid, it may be helpful for cough.  He does not want anything stronger such as hydrocodone.   See patient instructions for more details.   He will follow-up his  anemia with Dr. Melina Copa   Signed Abbe Amsterdam, MD

## 2013-07-24 NOTE — Patient Instructions (Signed)
Use the doxycycline antibiotic as directed, and the tessalon as needed for cough.  Let me know if you are not feeling better in the next few days- Sooner if worse.   You do have persistent anemia, but your numbers are better than they have been in a while.

## 2013-07-24 NOTE — Telephone Encounter (Signed)
error 

## 2013-07-25 ENCOUNTER — Ambulatory Visit (INDEPENDENT_AMBULATORY_CARE_PROVIDER_SITE_OTHER): Payer: Medicare Other

## 2013-07-25 VITALS — BP 132/84 | HR 71 | Temp 97.9°F | Resp 16 | Ht 70.0 in | Wt 219.6 lb

## 2013-07-25 DIAGNOSIS — Z4802 Encounter for removal of sutures: Secondary | ICD-10-CM

## 2013-07-25 NOTE — Progress Notes (Signed)
Patient comes into office today for suture removal from Right Chest Tube Placement on 07/10/13 (Pneumothorax, Trauma) Reviewed with Dr. Lindie Spruce and ask to check incision before proceeding to remove sutures.  After given verbal okay to remove I proceeded with suture removal.  Incision intact, placed steri-strips and dry gauze over incision.  No redness, swelling or drainage at the time of suture removal.  Patient tolerated well.

## 2013-07-31 DIAGNOSIS — F329 Major depressive disorder, single episode, unspecified: Secondary | ICD-10-CM | POA: Diagnosis not present

## 2013-07-31 DIAGNOSIS — I2510 Atherosclerotic heart disease of native coronary artery without angina pectoris: Secondary | ICD-10-CM | POA: Diagnosis not present

## 2013-07-31 DIAGNOSIS — F3289 Other specified depressive episodes: Secondary | ICD-10-CM | POA: Diagnosis not present

## 2013-07-31 DIAGNOSIS — M48061 Spinal stenosis, lumbar region without neurogenic claudication: Secondary | ICD-10-CM | POA: Diagnosis not present

## 2013-07-31 DIAGNOSIS — G609 Hereditary and idiopathic neuropathy, unspecified: Secondary | ICD-10-CM | POA: Diagnosis not present

## 2013-07-31 DIAGNOSIS — I10 Essential (primary) hypertension: Secondary | ICD-10-CM | POA: Diagnosis not present

## 2013-07-31 DIAGNOSIS — IMO0001 Reserved for inherently not codable concepts without codable children: Secondary | ICD-10-CM | POA: Diagnosis not present

## 2013-08-03 DIAGNOSIS — M48061 Spinal stenosis, lumbar region without neurogenic claudication: Secondary | ICD-10-CM | POA: Diagnosis not present

## 2013-08-03 DIAGNOSIS — IMO0001 Reserved for inherently not codable concepts without codable children: Secondary | ICD-10-CM | POA: Diagnosis not present

## 2013-08-03 DIAGNOSIS — I2510 Atherosclerotic heart disease of native coronary artery without angina pectoris: Secondary | ICD-10-CM | POA: Diagnosis not present

## 2013-08-03 DIAGNOSIS — G609 Hereditary and idiopathic neuropathy, unspecified: Secondary | ICD-10-CM | POA: Diagnosis not present

## 2013-08-03 DIAGNOSIS — F329 Major depressive disorder, single episode, unspecified: Secondary | ICD-10-CM | POA: Diagnosis not present

## 2013-08-03 DIAGNOSIS — F3289 Other specified depressive episodes: Secondary | ICD-10-CM | POA: Diagnosis not present

## 2013-08-03 DIAGNOSIS — I10 Essential (primary) hypertension: Secondary | ICD-10-CM | POA: Diagnosis not present

## 2013-08-07 DIAGNOSIS — F329 Major depressive disorder, single episode, unspecified: Secondary | ICD-10-CM | POA: Diagnosis not present

## 2013-08-07 DIAGNOSIS — M48061 Spinal stenosis, lumbar region without neurogenic claudication: Secondary | ICD-10-CM | POA: Diagnosis not present

## 2013-08-07 DIAGNOSIS — G609 Hereditary and idiopathic neuropathy, unspecified: Secondary | ICD-10-CM | POA: Diagnosis not present

## 2013-08-07 DIAGNOSIS — F3289 Other specified depressive episodes: Secondary | ICD-10-CM | POA: Diagnosis not present

## 2013-08-07 DIAGNOSIS — IMO0001 Reserved for inherently not codable concepts without codable children: Secondary | ICD-10-CM | POA: Diagnosis not present

## 2013-08-07 DIAGNOSIS — I2510 Atherosclerotic heart disease of native coronary artery without angina pectoris: Secondary | ICD-10-CM | POA: Diagnosis not present

## 2013-08-07 DIAGNOSIS — I10 Essential (primary) hypertension: Secondary | ICD-10-CM | POA: Diagnosis not present

## 2013-08-08 DIAGNOSIS — F3289 Other specified depressive episodes: Secondary | ICD-10-CM | POA: Diagnosis not present

## 2013-08-08 DIAGNOSIS — I10 Essential (primary) hypertension: Secondary | ICD-10-CM | POA: Diagnosis not present

## 2013-08-08 DIAGNOSIS — G609 Hereditary and idiopathic neuropathy, unspecified: Secondary | ICD-10-CM | POA: Diagnosis not present

## 2013-08-08 DIAGNOSIS — M48061 Spinal stenosis, lumbar region without neurogenic claudication: Secondary | ICD-10-CM | POA: Diagnosis not present

## 2013-08-08 DIAGNOSIS — F329 Major depressive disorder, single episode, unspecified: Secondary | ICD-10-CM | POA: Diagnosis not present

## 2013-08-08 DIAGNOSIS — IMO0001 Reserved for inherently not codable concepts without codable children: Secondary | ICD-10-CM | POA: Diagnosis not present

## 2013-08-08 DIAGNOSIS — I2510 Atherosclerotic heart disease of native coronary artery without angina pectoris: Secondary | ICD-10-CM | POA: Diagnosis not present

## 2013-08-17 DIAGNOSIS — H35359 Cystoid macular degeneration, unspecified eye: Secondary | ICD-10-CM | POA: Diagnosis not present

## 2013-08-21 ENCOUNTER — Encounter: Payer: Medicare Other | Admitting: Internal Medicine

## 2013-08-21 DIAGNOSIS — C61 Malignant neoplasm of prostate: Secondary | ICD-10-CM | POA: Diagnosis not present

## 2013-08-21 DIAGNOSIS — R972 Elevated prostate specific antigen [PSA]: Secondary | ICD-10-CM | POA: Diagnosis not present

## 2013-08-23 ENCOUNTER — Encounter: Payer: Self-pay | Admitting: Internal Medicine

## 2013-08-23 ENCOUNTER — Ambulatory Visit (INDEPENDENT_AMBULATORY_CARE_PROVIDER_SITE_OTHER): Payer: Medicare Other | Admitting: Internal Medicine

## 2013-08-23 VITALS — BP 134/72 | HR 68 | Ht 70.0 in | Wt 216.0 lb

## 2013-08-23 DIAGNOSIS — I480 Paroxysmal atrial fibrillation: Secondary | ICD-10-CM | POA: Insufficient documentation

## 2013-08-23 DIAGNOSIS — I495 Sick sinus syndrome: Secondary | ICD-10-CM | POA: Diagnosis not present

## 2013-08-23 DIAGNOSIS — Z950 Presence of cardiac pacemaker: Secondary | ICD-10-CM

## 2013-08-23 DIAGNOSIS — Z45018 Encounter for adjustment and management of other part of cardiac pacemaker: Secondary | ICD-10-CM

## 2013-08-23 DIAGNOSIS — I2510 Atherosclerotic heart disease of native coronary artery without angina pectoris: Secondary | ICD-10-CM | POA: Diagnosis not present

## 2013-08-23 DIAGNOSIS — I4891 Unspecified atrial fibrillation: Secondary | ICD-10-CM

## 2013-08-23 LAB — MDC_IDC_ENUM_SESS_TYPE_INCLINIC
Brady Statistic RV Percent Paced: 2 %
Date Time Interrogation Session: 20150128050000
Lead Channel Pacing Threshold Amplitude: 0.5 V
Lead Channel Pacing Threshold Amplitude: 1.2 V
Lead Channel Pacing Threshold Amplitude: 1.3 V
Lead Channel Pacing Threshold Amplitude: 1.4 V
Lead Channel Pacing Threshold Pulse Width: 0.4 ms
Lead Channel Pacing Threshold Pulse Width: 0.4 ms
Lead Channel Pacing Threshold Pulse Width: 0.8 ms
Lead Channel Setting Pacing Amplitude: 2 V
Lead Channel Setting Pacing Amplitude: 2.5 V
Lead Channel Setting Pacing Pulse Width: 0.8 ms
Lead Channel Setting Sensing Sensitivity: 2.5 mV
MDC IDC MSMT LEADCHNL RA IMPEDANCE VALUE: 580 Ohm
MDC IDC MSMT LEADCHNL RA PACING THRESHOLD PULSEWIDTH: 0.4 ms
MDC IDC MSMT LEADCHNL RA SENSING INTR AMPL: 2.8 mV
MDC IDC MSMT LEADCHNL RV IMPEDANCE VALUE: 470 Ohm
MDC IDC MSMT LEADCHNL RV PACING THRESHOLD AMPLITUDE: 1.2 V
MDC IDC MSMT LEADCHNL RV PACING THRESHOLD PULSEWIDTH: 0.6 ms
MDC IDC MSMT LEADCHNL RV SENSING INTR AMPL: 10.4 mV
MDC IDC PG SERIAL: 317986
MDC IDC STAT BRADY RA PERCENT PACED: 95 %

## 2013-08-23 NOTE — Assessment & Plan Note (Signed)
Stable with good heart rate excursion

## 2013-08-23 NOTE — Patient Instructions (Signed)

## 2013-08-23 NOTE — Assessment & Plan Note (Signed)
An isolated episode of atrial fibrillation was noted the time of his pneumothorax. We will not initiate anticoagulation was received recurrences. There is no history of prior stroke

## 2013-08-23 NOTE — Assessment & Plan Note (Signed)
The patient's device was interrogated.  The information was reviewed. No changes were made in the programming.    

## 2013-08-23 NOTE — Assessment & Plan Note (Signed)
Currently he is on 325 aspirin and Plavix. I will followup with Dr. Tamala Julian to see whether his aspirin dose can be reduced

## 2013-08-23 NOTE — Progress Notes (Signed)
Patient Care Team: Lajean Manes, MD as PCP - General (Internal Medicine) Ladell Pier, MD as Attending Physician (Hematology and Oncology) Myrlene Broker, MD as Attending Physician (Urology)   HPI  Edward Woodward is a 80 y.o. male Seen to establish pacemaker followup needed number of years ago for sinus node dysfunction and presyncope. He has alleviated his symptoms.  He also has a history of coronary artery disease with mild left ventricular dysfunction with EF of 50% 2011.  He has no history of atrial fibrillation but was documented time of his pneumothorax that was a consequence of a fall 12/14 .  Functional status is stable.    Past Medical History  Diagnosis Date  . Cancer   . Prostate cancer   . Neuropathy   . Hypercholesteremia   . Depressive disorder, not elsewhere classified   . Angina pectoris   . Sinoatrial node dysfunction   . Degeneration of lumbar or lumbosacral intervertebral disc   . Dyspnea   . CAD (coronary artery disease)      NYHA Class 1B, CABG 1985  . HTN (hypertension)   . OSA on CPAP     Past Surgical History  Procedure Laterality Date  . Coronary artery bypass graft    . Exploratory laparotomy    . Endarterectomy    . Rotator cuff repair    . Lumbar fusion    . Coronary angioplasty with stent placement      s/p bare metal stent implant SVG-diagonal 11/23/05, s/p BM stent implantation SVG diagonal 10/22/06 (feeds LAD), and 05/2010 with DES to left circumflex  . Pacemaker insertion      Current Outpatient Prescriptions  Medication Sig Dispense Refill  . aspirin 325 MG tablet Take 325 mg by mouth daily.        . ATORVASTATIN CALCIUM PO Take by mouth every other day. Unsure of dose      . b complex vitamins tablet Take 1 tablet by mouth daily.        . citalopram (CELEXA) 20 MG tablet Take 20 mg by mouth daily.        . clopidogrel (PLAVIX) 75 MG tablet Take 75 mg by mouth daily.        Marland Kitchen docusate sodium (COLACE) 100 MG  capsule Take 100 mg by mouth 2 (two) times daily.        . furosemide (LASIX) 40 MG tablet Take 40-80 mg by mouth daily. Take 40 mg or  80 mg every day. Alternate doses.      Marland Kitchen gabapentin (NEURONTIN) 300 MG capsule Take 300 mg by mouth 3 (three) times daily.       . hydroxypropyl methylcellulose (ISOPTO TEARS) 2.5 % ophthalmic solution 1 drop.      . isosorbide mononitrate (IMDUR) 30 MG 24 hr tablet Take 30 mg by mouth daily.      . nitroGLYCERIN (NITROSTAT) 0.4 MG SL tablet Place 1 tablet (0.4 mg total) under the tongue every 5 (five) minutes as needed for chest pain.  25 tablet  3  . Nutritional Supplements (MELATONIN PO) Take 1 tablet by mouth at bedtime.        Marland Kitchen UBIQUINOL PO Take 1 tablet by mouth daily. CoQ10      . zolpidem (AMBIEN) 5 MG tablet at bedtime as needed.        No current facility-administered medications for this visit.    Allergies  Allergen Reactions  . Penicillins Swelling  Review of Systems negative except from HPI and PMH  Physical Exam BP 134/72  Pulse 68  Ht 5\' 10"  (1.778 m)  Wt 216 lb (97.977 kg)  BMI 30.99 kg/m2 Well developed and nourished in no acute distress HENT normal Neck supple with JVP-flat Clear Regular rate and rhythm, no murmurs or gallops Abd-soft with active BS No Clubbing cyanosis edema Skin-warm and dry A & Oriented  Grossly normal sensory and motor function    Assessment and  Plan

## 2013-08-24 DIAGNOSIS — L219 Seborrheic dermatitis, unspecified: Secondary | ICD-10-CM | POA: Diagnosis not present

## 2013-08-24 DIAGNOSIS — L821 Other seborrheic keratosis: Secondary | ICD-10-CM | POA: Diagnosis not present

## 2013-08-24 DIAGNOSIS — Z85828 Personal history of other malignant neoplasm of skin: Secondary | ICD-10-CM | POA: Diagnosis not present

## 2013-08-24 DIAGNOSIS — L570 Actinic keratosis: Secondary | ICD-10-CM | POA: Diagnosis not present

## 2013-09-22 DIAGNOSIS — N183 Chronic kidney disease, stage 3 unspecified: Secondary | ICD-10-CM | POA: Diagnosis not present

## 2013-09-22 DIAGNOSIS — Z0000 Encounter for general adult medical examination without abnormal findings: Secondary | ICD-10-CM | POA: Diagnosis not present

## 2013-09-22 DIAGNOSIS — Z1331 Encounter for screening for depression: Secondary | ICD-10-CM | POA: Diagnosis not present

## 2013-09-22 DIAGNOSIS — I129 Hypertensive chronic kidney disease with stage 1 through stage 4 chronic kidney disease, or unspecified chronic kidney disease: Secondary | ICD-10-CM | POA: Diagnosis not present

## 2013-09-22 DIAGNOSIS — Z79899 Other long term (current) drug therapy: Secondary | ICD-10-CM | POA: Diagnosis not present

## 2013-09-22 DIAGNOSIS — E7800 Pure hypercholesterolemia, unspecified: Secondary | ICD-10-CM | POA: Diagnosis not present

## 2013-09-22 DIAGNOSIS — Z23 Encounter for immunization: Secondary | ICD-10-CM | POA: Diagnosis not present

## 2013-09-25 ENCOUNTER — Ambulatory Visit: Payer: Medicare Other | Admitting: Oncology

## 2013-09-25 ENCOUNTER — Other Ambulatory Visit (HOSPITAL_BASED_OUTPATIENT_CLINIC_OR_DEPARTMENT_OTHER): Payer: Medicare Other

## 2013-09-25 ENCOUNTER — Telehealth: Payer: Self-pay | Admitting: Oncology

## 2013-09-25 DIAGNOSIS — C61 Malignant neoplasm of prostate: Secondary | ICD-10-CM | POA: Diagnosis not present

## 2013-09-25 DIAGNOSIS — C8589 Other specified types of non-Hodgkin lymphoma, extranodal and solid organ sites: Secondary | ICD-10-CM

## 2013-09-25 LAB — CBC WITH DIFFERENTIAL/PLATELET
BASO%: 0.5 % (ref 0.0–2.0)
Basophils Absolute: 0 10*3/uL (ref 0.0–0.1)
EOS%: 3.1 % (ref 0.0–7.0)
Eosinophils Absolute: 0.3 10*3/uL (ref 0.0–0.5)
HEMATOCRIT: 37.3 % — AB (ref 38.4–49.9)
HGB: 12.2 g/dL — ABNORMAL LOW (ref 13.0–17.1)
LYMPH%: 37 % (ref 14.0–49.0)
MCH: 29.8 pg (ref 27.2–33.4)
MCHC: 32.6 g/dL (ref 32.0–36.0)
MCV: 91.6 fL (ref 79.3–98.0)
MONO#: 0.7 10*3/uL (ref 0.1–0.9)
MONO%: 8 % (ref 0.0–14.0)
NEUT#: 4.2 10*3/uL (ref 1.5–6.5)
NEUT%: 51.4 % (ref 39.0–75.0)
PLATELETS: 167 10*3/uL (ref 140–400)
RBC: 4.07 10*6/uL — ABNORMAL LOW (ref 4.20–5.82)
RDW: 15.4 % — ABNORMAL HIGH (ref 11.0–14.6)
WBC: 8.2 10*3/uL (ref 4.0–10.3)
lymph#: 3 10*3/uL (ref 0.9–3.3)

## 2013-09-25 LAB — BASIC METABOLIC PANEL (CC13)
Anion Gap: 8 mEq/L (ref 3–11)
BUN: 27.3 mg/dL — ABNORMAL HIGH (ref 7.0–26.0)
CO2: 28 meq/L (ref 22–29)
CREATININE: 1.3 mg/dL (ref 0.7–1.3)
Calcium: 10.3 mg/dL (ref 8.4–10.4)
Chloride: 107 mEq/L (ref 98–109)
Glucose: 103 mg/dl (ref 70–140)
Potassium: 4.1 mEq/L (ref 3.5–5.1)
Sodium: 143 mEq/L (ref 136–145)

## 2013-09-25 NOTE — Telephone Encounter (Signed)
Gave pt appt for MD for 3/17 r/s from 3/2 due to MD's PAL

## 2013-09-26 LAB — PSA: PSA: 11.07 ng/mL — AB (ref <=4.00)

## 2013-10-10 ENCOUNTER — Ambulatory Visit (HOSPITAL_BASED_OUTPATIENT_CLINIC_OR_DEPARTMENT_OTHER): Payer: Medicare Other | Admitting: Oncology

## 2013-10-10 ENCOUNTER — Telehealth: Payer: Self-pay | Admitting: Oncology

## 2013-10-10 VITALS — BP 136/62 | HR 67 | Temp 97.3°F | Resp 18 | Ht 70.0 in | Wt 218.4 lb

## 2013-10-10 DIAGNOSIS — C61 Malignant neoplasm of prostate: Secondary | ICD-10-CM | POA: Diagnosis not present

## 2013-10-10 DIAGNOSIS — M25559 Pain in unspecified hip: Secondary | ICD-10-CM

## 2013-10-10 DIAGNOSIS — C8589 Other specified types of non-Hodgkin lymphoma, extranodal and solid organ sites: Secondary | ICD-10-CM

## 2013-10-10 NOTE — Progress Notes (Signed)
Edward Woodward    OFFICE PROGRESS NOTE   INTERVAL HISTORY:   Edward Woodward returns for scheduled followup of non-Hodgkin's lymphoma and prostate cancer. He continues to have leg pain with ambulation. He has pain at the right hip and femur with weightbearing. No fever or night sweats. Questionable palpable lymph node at the left lower neck. He is no longer receiving Lupron injections with Dr. Rosana Hoes. The PSA is higher.  Objective:  Vital signs in last 24 hours:  Blood pressure 136/62, pulse 67, temperature 97.3 F (36.3 C), temperature source Oral, resp. rate 18, height 5\' 10"  (1.778 m), weight 218 lb 6.4 oz (99.066 kg), SpO2 97.00%.    HEENT: Neck without mass Lymphatics: No cervical, supraclavicular, axillary, or inguinal nodes. Slight fullness at the left lower posterior neck without a discrete lymph node. Resp: Lungs cure bilaterally Cardio: Regular rate and rhythm GI: No hepatosplenomegaly Vascular: Trace edema at the left greater than right lower leg.  Musculoskeletal: Pain with motion at the right hip     Lab Results:  Lab Results  Component Value Date   WBC 8.2 09/25/2013   HGB 12.2* 09/25/2013   HCT 37.3* 09/25/2013   MCV 91.6 09/25/2013   PLT 167 09/25/2013   NEUTROABS 4.2 09/25/2013   09/25/2013-PSA 11.07, creatinine 1.3   Medications: I have reviewed the patient's current medications.  Assessment/Plan: 1. Non-Hodgkin's lymphoma (follicular grade 3 lymphoma) - status post 6 cycles of Cytoxan/prednisone/rituximab 07/01/2007 through 10/21/2007. He remains in clinical remission. 2. Prostate cancer - he has been treated with hormonal therapy, the PSA is slowly rising  3. History of a normocytic anemia secondary to non-Hodgkin's lymphoma, chemotherapy, and renal insufficiency - stable  4. History of Mild thrombocytopenia  5. History of bilateral hydronephrosis. 6. Coronary artery disease - status post coronary artery stent placement. 7. History of noncalcified  lung nodules. 8. History of severe neutropenia July 2009 - likely related to delayed toxicity from rituximab. 9. Macular degeneration. 10. Right middle lobe density on a CT of the chest 06/18/2010 measuring 2.5 x 1.6 cm with mildly increased metabolic activity (SUV max 2.5) on a PET scan 06/27/2010. The area of increased density was less confluent and appeared smaller on a restaging CT 09/08/2010. Right middle lobe "scarring "with no new or suspicious pulmonary nodule on a CT 08/04/2011. 11. Mild increased metabolic activity associated with several lymph nodes on the PET scan 06/27/2010 - likely related to lymphoma. No lymphadenopathy in the mediastinum or axillary areas on the CT 08/04/2011. 12. Right hip discomfort-he received a steroid injection by his primary physician without improvement. He saw Dr. Collier Woodward and there was a question of a lytic process in the right pelvis. A bone scan on 09/21/2012 revealed no evidence of metastatic disease.  13. Rounded fullness in the left low posterior neck-? Cyst,? Lymphadenopathy  Disposition:  [name] remains in clinical remission from non-Hodgkins lymphoma. The PSA is slowly rising. He most likely has developed hormone refractory prostate cancer. We will check a bone scan to be sure he has not developed metastatic bone lesions. I doubt the right hip discomfort is related to prostate cancer given the chronicity. If the bone scan is negative I recommend continued observation. We discussed second line hormonal therapy and systemic chemotherapy options. We can consider second line hormonal therapy if he develops symptomatic progression of the prostate cancer.  [name] will return for an office and lab visit in 4 months.   Edward Coder, MD  10/10/2013  11:22 AM

## 2013-10-10 NOTE — Telephone Encounter (Signed)
gv adn printed aptp sched and avs for pt for July °

## 2013-10-19 ENCOUNTER — Encounter (HOSPITAL_COMMUNITY)
Admission: RE | Admit: 2013-10-19 | Discharge: 2013-10-19 | Disposition: A | Discharge status: Home / Self Care | Payer: Medicare Other | Source: Ambulatory Visit | Attending: Oncology | Admitting: Oncology

## 2013-10-19 ENCOUNTER — Ambulatory Visit (HOSPITAL_COMMUNITY)
Admission: RE | Admit: 2013-10-19 | Discharge: 2013-10-19 | Disposition: A | Discharge status: Home / Self Care | Payer: Medicare Other | Source: Ambulatory Visit | Attending: Oncology | Admitting: Oncology

## 2013-10-19 DIAGNOSIS — M25559 Pain in unspecified hip: Secondary | ICD-10-CM | POA: Diagnosis not present

## 2013-10-19 DIAGNOSIS — M169 Osteoarthritis of hip, unspecified: Secondary | ICD-10-CM | POA: Diagnosis not present

## 2013-10-19 DIAGNOSIS — M1610 Unilateral primary osteoarthritis, unspecified hip: Secondary | ICD-10-CM | POA: Diagnosis not present

## 2013-10-19 DIAGNOSIS — C61 Malignant neoplasm of prostate: Secondary | ICD-10-CM | POA: Diagnosis not present

## 2013-10-19 MED ORDER — TECHNETIUM TC 99M MEDRONATE IV KIT
25.3000 | PACK | Freq: Once | INTRAVENOUS | Status: AC | PRN
  Administered 2013-10-19: 25.3 via INTRAVENOUS

## 2013-10-20 ENCOUNTER — Telehealth: Payer: Self-pay | Admitting: *Deleted

## 2013-10-20 NOTE — Telephone Encounter (Signed)
Called pt with bone scan results. Consistent with arthritis at R hip, per Dr. Benay Spice. Areas of increased uptake in spine likely degenerative arthritis. Follow up as scheduled. Pt asks if he should see a rheumatologist, recommended he discuss with PCP.

## 2013-10-20 NOTE — Telephone Encounter (Signed)
Message copied by Brien Few on Fri Oct 20, 2013  4:37 PM ------      Message from: [name] B      Created: Thu Oct 19, 2013  9:27 PM       Please call patient, hip xray and bone scan consistent with arthritis at rt. Hip,  Other areas of increased uptake in the spine most likely degenerative arthritis, f/u as scheduled ------

## 2013-10-30 ENCOUNTER — Other Ambulatory Visit: Payer: Self-pay | Admitting: *Deleted

## 2013-10-30 DIAGNOSIS — R972 Elevated prostate specific antigen [PSA]: Secondary | ICD-10-CM | POA: Diagnosis not present

## 2013-10-30 DIAGNOSIS — C61 Malignant neoplasm of prostate: Secondary | ICD-10-CM | POA: Diagnosis not present

## 2013-10-30 MED ORDER — ISOSORBIDE MONONITRATE ER 30 MG PO TB24: 30.0000 mg | ORAL_TABLET | Freq: Every day | ORAL | Status: DC

## 2014-02-08 ENCOUNTER — Ambulatory Visit (HOSPITAL_BASED_OUTPATIENT_CLINIC_OR_DEPARTMENT_OTHER): Payer: Medicare Other | Admitting: Oncology

## 2014-02-08 ENCOUNTER — Other Ambulatory Visit (HOSPITAL_BASED_OUTPATIENT_CLINIC_OR_DEPARTMENT_OTHER): Payer: Medicare Other

## 2014-02-08 ENCOUNTER — Telehealth: Payer: Self-pay | Admitting: Oncology

## 2014-02-08 VITALS — BP 156/62 | HR 65 | Temp 98.0°F | Resp 18 | Ht 70.0 in | Wt 219.3 lb

## 2014-02-08 DIAGNOSIS — C61 Malignant neoplasm of prostate: Secondary | ICD-10-CM | POA: Diagnosis not present

## 2014-02-08 DIAGNOSIS — M25559 Pain in unspecified hip: Secondary | ICD-10-CM | POA: Diagnosis not present

## 2014-02-08 DIAGNOSIS — R0989 Other specified symptoms and signs involving the circulatory and respiratory systems: Secondary | ICD-10-CM

## 2014-02-08 DIAGNOSIS — C8299 Follicular lymphoma, unspecified, extranodal and solid organ sites: Secondary | ICD-10-CM

## 2014-02-08 DIAGNOSIS — C8589 Other specified types of non-Hodgkin lymphoma, extranodal and solid organ sites: Secondary | ICD-10-CM

## 2014-02-08 DIAGNOSIS — R0609 Other forms of dyspnea: Secondary | ICD-10-CM

## 2014-02-08 DIAGNOSIS — R197 Diarrhea, unspecified: Secondary | ICD-10-CM

## 2014-02-08 LAB — CBC WITH DIFFERENTIAL/PLATELET
BASO%: 0.5 % (ref 0.0–2.0)
Basophils Absolute: 0 10*3/uL (ref 0.0–0.1)
EOS%: 2.9 % (ref 0.0–7.0)
Eosinophils Absolute: 0.2 10*3/uL (ref 0.0–0.5)
HCT: 34.3 % — ABNORMAL LOW (ref 38.4–49.9)
HGB: 11.3 g/dL — ABNORMAL LOW (ref 13.0–17.1)
LYMPH%: 35.2 % (ref 14.0–49.0)
MCH: 30.1 pg (ref 27.2–33.4)
MCHC: 32.9 g/dL (ref 32.0–36.0)
MCV: 91.3 fL (ref 79.3–98.0)
MONO#: 0.7 10*3/uL (ref 0.1–0.9)
MONO%: 9.6 % (ref 0.0–14.0)
NEUT#: 3.9 10*3/uL (ref 1.5–6.5)
NEUT%: 51.8 % (ref 39.0–75.0)
PLATELETS: 143 10*3/uL (ref 140–400)
RBC: 3.76 10*6/uL — AB (ref 4.20–5.82)
RDW: 15.3 % — AB (ref 11.0–14.6)
WBC: 7.5 10*3/uL (ref 4.0–10.3)
lymph#: 2.6 10*3/uL (ref 0.9–3.3)

## 2014-02-08 LAB — BASIC METABOLIC PANEL (CC13)
ANION GAP: 10 meq/L (ref 3–11)
BUN: 33.6 mg/dL — ABNORMAL HIGH (ref 7.0–26.0)
CO2: 25 meq/L (ref 22–29)
Calcium: 10 mg/dL (ref 8.4–10.4)
Chloride: 106 mEq/L (ref 98–109)
Creatinine: 1.6 mg/dL — ABNORMAL HIGH (ref 0.7–1.3)
Glucose: 100 mg/dl (ref 70–140)
Potassium: 4.2 mEq/L (ref 3.5–5.1)
SODIUM: 141 meq/L (ref 136–145)

## 2014-02-08 NOTE — Telephone Encounter (Signed)
Pt confirmed labs/ov per 07/16 POF, gave pt AVS......KJ °

## 2014-02-08 NOTE — Progress Notes (Signed)
[facility] OFFICE PROGRESS NOTE   Diagnosis: Prostate cancer, non-Hodgkin's lymphoma  INTERVAL HISTORY:   Edward Woodward returns as scheduled. He has multiple complaints including persistent neuropathy symptoms in the feet that limits his ability to ambulate. He also complains of exertional dyspnea. Pain at the right hip with certain movements. No consistent pain. No back pain. He reports diarrhea for 3-4 days with abdominal discomfort last night. No fever. He felt "cold "last night.  Objective:  Vital signs in last 24 hours:  Blood pressure 156/62, pulse 65, temperature 98 F (36.7 C), temperature source Oral, resp. rate 18, height 5\' 10"  (1.778 m), weight 219 lb 4.8 oz (99.474 kg).    HEENT: Neck without mass Lymphatics: Soft 1 cm left posterior scalene node, no other cervical, supraclavicular, axillary, or inguinal node Resp: Decreased breath sounds with coarse rhonchi at the right base, no respiratory distress Cardio: Regular rate and rhythm GI: No hepatosplenomegaly, nontender, no mass Vascular: Trace to 1+ pitting edema at the left greater than right lower leg  Musculoskeletal: No spine tenderness     Lab Results:  Lab Results  Component Value Date   WBC 7.5 02/08/2014   HGB 11.3* 02/08/2014   HCT 34.3* 02/08/2014   MCV 91.3 02/08/2014   PLT 143 02/08/2014   NEUTROABS 3.9 02/08/2014   PSA on 09/25/2013-11.07  Imaging:  Bone scan 10/19/2013, compared to 09/21/2012-single focus of uptake at the right lateral aspect of the T9 is slightly larger and a metastasis cannot be excluded. Uptake at the right hip joint compatible with osteoarthritis. Medications: I have reviewed the patient's current medications.  Assessment/Plan: 1. Non-Hodgkin's lymphoma (follicular grade 3 lymphoma) - status post 6 cycles of Cytoxan/prednisone/rituximab 07/01/2007 through 10/21/2007. He remains in clinical remission. 2. Prostate cancer - he has been treated with hormonal therapy,  the PSA is slowly rising  3. History of a normocytic anemia secondary to non-Hodgkin's lymphoma, chemotherapy, and renal insufficiency - stable  4. History of Mild thrombocytopenia  5. History of bilateral hydronephrosis. 6. Coronary artery disease - status post coronary artery stent placement. 7. History of noncalcified lung nodules. 8. History of severe neutropenia July 2009 - likely related to delayed toxicity from rituximab. 9. Macular degeneration. 10. Right middle lobe density on a CT of the chest 06/18/2010 measuring 2.5 x 1.6 cm with mildly increased metabolic activity (SUV max 2.5) on a PET scan 06/27/2010. The area of increased density was less confluent and appeared smaller on a restaging CT 09/08/2010. Right middle lobe "scarring "with no new or suspicious pulmonary nodule on a CT 08/04/2011. 11. Mild increased metabolic activity associated with several lymph nodes on the PET scan 06/27/2010 - likely related to lymphoma. No lymphadenopathy in the mediastinum or axillary areas on the CT 08/04/2011. 12. Right hip discomfort-he received a steroid injection by his primary physician without improvement. He saw Dr. Collier [place] and there was a question of a lytic process in the right pelvis. A bone scan on 09/21/2012 revealed no evidence of metastatic disease. 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 ? Small left posterior scalene node   Disposition:  Edward Woodward has prostate cancer, a history of non-Hodgkin's lymphoma, and multiple medical conditions. I doubt his exertional dyspnea is related to the prostate cancer or lymphoma. He plans to schedule an appointment with Dr. Tamala Julian for a cardiac evaluation. He continues Lupron therapy with Dr. Rosana Hoes. We will followup on the PSA from today.  He will return for an  office and lab visit in 4 months. We will consider salvage hormonal therapy for prostate cancer if he develops clear disease progression.  [name],  MD  02/08/2014  4:36 PM

## 2014-02-09 ENCOUNTER — Telehealth: Payer: Self-pay | Admitting: Interventional Cardiology

## 2014-02-09 LAB — PSA: PSA: 11.15 ng/mL — ABNORMAL HIGH (ref <=4.00)

## 2014-02-09 MED ORDER — FUROSEMIDE 40 MG PO TABS: 80.0000 mg | ORAL_TABLET | Freq: Every day | ORAL | Status: DC

## 2014-02-09 NOTE — Telephone Encounter (Signed)
The pt states that he has been having SOB with exertion for the past couple of weeks and wants an appointment to see Dr Tamala Julian soon. He denies chest pain or dizziness at this time.  Per Dr Tamala Julian the pt is advised to increase his Furosemide to 80 mg daily and to call the office on Monday 7/20 or Tuesday 7/21 to let us know if his SOB with exertion has improved. Also, per Dr Tamala Julian, I scheduled an appointment for the pt to f/u with Dr Tamala Julian on 04/04/14, the pt is aware and agrees with plan.

## 2014-02-09 NOTE — Telephone Encounter (Signed)
New Message  Pt called reports that he has Experienced SOB since 8 am today. Please assist.

## 2014-02-13 ENCOUNTER — Telehealth: Payer: Self-pay | Admitting: *Deleted

## 2014-02-13 NOTE — Telephone Encounter (Signed)
Patient called in to report that the increase of furosemide per Dr. Tamala Julian has helped to decrease his SOB/dyspnea. Patient states that he continues to have the swelling to his legs, however denies coughing and his BP is 114/60. Rx for furosemide sent in to pharmacy. Message forwarded to Dr. Tamala Julian as Juluis Rainier.

## 2014-02-15 DIAGNOSIS — H35359 Cystoid macular degeneration, unspecified eye: Secondary | ICD-10-CM | POA: Diagnosis not present

## 2014-02-15 DIAGNOSIS — H43819 Vitreous degeneration, unspecified eye: Secondary | ICD-10-CM | POA: Diagnosis not present

## 2014-02-15 DIAGNOSIS — H353290 Exudative age-related macular degeneration, unspecified eye, stage unspecified: Secondary | ICD-10-CM | POA: Diagnosis not present

## 2014-02-15 DIAGNOSIS — Z961 Presence of intraocular lens: Secondary | ICD-10-CM | POA: Diagnosis not present

## 2014-02-21 ENCOUNTER — Encounter: Payer: Self-pay | Admitting: Cardiovascular Disease

## 2014-02-21 ENCOUNTER — Ambulatory Visit (INDEPENDENT_AMBULATORY_CARE_PROVIDER_SITE_OTHER): Payer: Medicare Other | Admitting: Cardiovascular Disease

## 2014-02-21 ENCOUNTER — Ambulatory Visit (INDEPENDENT_AMBULATORY_CARE_PROVIDER_SITE_OTHER): Payer: Medicare Other | Admitting: *Deleted

## 2014-02-21 VITALS — BP 116/76 | HR 65 | Ht 70.0 in | Wt 214.0 lb

## 2014-02-21 DIAGNOSIS — Z950 Presence of cardiac pacemaker: Secondary | ICD-10-CM | POA: Diagnosis not present

## 2014-02-21 DIAGNOSIS — I209 Angina pectoris, unspecified: Secondary | ICD-10-CM | POA: Diagnosis not present

## 2014-02-21 DIAGNOSIS — R0602 Shortness of breath: Secondary | ICD-10-CM | POA: Diagnosis not present

## 2014-02-21 DIAGNOSIS — R079 Chest pain, unspecified: Secondary | ICD-10-CM | POA: Diagnosis not present

## 2014-02-21 DIAGNOSIS — I509 Heart failure, unspecified: Secondary | ICD-10-CM

## 2014-02-21 DIAGNOSIS — R5381 Other malaise: Secondary | ICD-10-CM | POA: Diagnosis not present

## 2014-02-21 DIAGNOSIS — I2510 Atherosclerotic heart disease of native coronary artery without angina pectoris: Secondary | ICD-10-CM

## 2014-02-21 DIAGNOSIS — R5383 Other fatigue: Secondary | ICD-10-CM | POA: Diagnosis not present

## 2014-02-21 DIAGNOSIS — I495 Sick sinus syndrome: Secondary | ICD-10-CM

## 2014-02-21 DIAGNOSIS — I25119 Atherosclerotic heart disease of native coronary artery with unspecified angina pectoris: Secondary | ICD-10-CM

## 2014-02-21 LAB — MDC_IDC_ENUM_SESS_TYPE_INCLINIC
Implantable Pulse Generator Model: 1297
Implantable Pulse Generator Serial Number: 317986
Lead Channel Impedance Value: 480 Ohm
Lead Channel Impedance Value: 590 Ohm
Lead Channel Pacing Threshold Amplitude: 1.2 V
Lead Channel Pacing Threshold Pulse Width: 0.4 ms
Lead Channel Pacing Threshold Pulse Width: 0.8 ms
Lead Channel Sensing Intrinsic Amplitude: 9.6 mV
Lead Channel Setting Pacing Amplitude: 2 V
Lead Channel Setting Pacing Amplitude: 2.5 V
Lead Channel Setting Pacing Pulse Width: 0.8 ms
Lead Channel Setting Sensing Sensitivity: 2.5 mV
MDC IDC MSMT LEADCHNL RA PACING THRESHOLD AMPLITUDE: 0.5 V
MDC IDC MSMT LEADCHNL RA SENSING INTR AMPL: 1.6 mV
MDC IDC SESS DTM: 20150729040000
MDC IDC STAT BRADY RA PERCENT PACED: 95 %
MDC IDC STAT BRADY RV PERCENT PACED: 3 %

## 2014-02-21 MED ORDER — ASPIRIN EC 81 MG PO TBEC: 81.0000 mg | DELAYED_RELEASE_TABLET | Freq: Every day | ORAL | Status: DC

## 2014-02-21 NOTE — Assessment & Plan Note (Signed)
The patient presents today with multiple complaints-not feeling well. He's been having some vague chest discomfort but he states is not similar to his previous episodes of angina. He gets very short of breath with minimal exertion. His Lasix was doubled several weeks ago but this did not really help. He's also had  mild leg edema. This lady and we'll was helped only minimally with the increased Lasix.  He is at least moderate chronic kidney disease.  At this point I'm not sure exactly whether his issues are related to congestive heart failure or unstable angina. We'll get labs including TSH, CBC, basic metabolic profile, B. natruretic peptide, and liver enzymes. We'll schedule him an echocardiogram to evaluate his left systolic function and valvular function. I would also like to schedule him for a Lexiscan myoview to rule out ischemia.  His EKG is nonacute I don't think that he's having acute MI at this point.  Incursion that he keep his appointment with Dr. Tamala Julian in early September. He is to call back if he has worsening problems.

## 2014-02-21 NOTE — Progress Notes (Signed)
Patient ID: Edward Woodward, male   DOB: [birth year], 80 y.o.   MRN: [id number]    [address], [address], [place]  [address] Phone: [phone] Fax:  [phone]  Date:  02/21/2014   ID:  Edward Woodward, DOB 1926-09-03, MRN [id number]  PCP:  Mathews Argyle, MD    Problem List 1.  CAD 2.  Prostate cancer 3.      SUBJECTIVE: Edward Woodward is a 80 y.o. male who since Friday evening has had dyspnea on exertion and at rest with some orthopnea. Prior to developing dyspnea he fell on his right side. He has some chest soreness but no focal discomfort. There is no lower extremity swelling and he denies angina.  February 21, 2014:  Edward Woodward is an 80 yo who is seen today for work - in visit ( DOD visit)  He has been having significant dyspnea - especially with exertion.  This is associated with shoulder pain. He has had some leg edema   He has a hx of coronary stenting- years ago     Wt Readings from Last 3 Encounters:  02/08/14 219 lb 4.8 oz (99.474 kg)  10/10/13 218 lb 6.4 oz (99.066 kg)  08/23/13 216 lb (97.977 kg)     Past Medical History  Diagnosis Date  . Cancer   . Prostate cancer   . Neuropathy   . Hypercholesteremia   . Depressive disorder, not elsewhere classified   . Angina pectoris   . Sinoatrial node dysfunction   . Degeneration of lumbar or lumbosacral intervertebral disc   . Dyspnea   . CAD (coronary artery disease)      NYHA Class 1B, CABG 1985  . HTN (hypertension)   . OSA on CPAP     Current Outpatient Prescriptions  Medication Sig Dispense Refill  . aspirin 325 MG tablet Take 325 mg by mouth daily.        . ATORVASTATIN CALCIUM PO Take 20 mg by mouth every other day. Unsure of dose      . b complex vitamins tablet Take 1 tablet by mouth daily.        . Cholecalciferol (VITAMIN D3) 2000 UNITS TABS Take 1 tablet by mouth daily.      . citalopram (CELEXA) 20 MG tablet Take 20 mg by mouth daily.        . clopidogrel (PLAVIX) 75  MG tablet Take 75 mg by mouth daily.        Marland Kitchen docusate sodium (COLACE) 100 MG capsule Take 100 mg by mouth 2 (two) times daily.        . furosemide (LASIX) 40 MG tablet Take 2 tablets (80 mg total) by mouth daily.  60 tablet  3  . gabapentin (NEURONTIN) 300 MG capsule Take 300 mg by mouth 2 (two) times daily. 2 times daily w/ additional dose PRN, not to exceed TID      . hydroxypropyl methylcellulose (ISOPTO TEARS) 2.5 % ophthalmic solution 1 drop.      . isosorbide mononitrate (IMDUR) 30 MG 24 hr tablet Take 1 tablet (30 mg total) by mouth daily.  90 tablet  1  . Multiple Vitamin (MULTIVITAMIN) capsule Take 1 capsule by mouth daily.      . nitroGLYCERIN (NITROSTAT) 0.4 MG SL tablet Place 1 tablet (0.4 mg total) under the tongue every 5 (five) minutes as needed for chest pain.  25 tablet  3  . Nutritional Supplements (MELATONIN PO) Take 1 tablet  by mouth at bedtime.        Marland Kitchen UBIQUINOL PO Take 1 tablet by mouth daily. CoQ10      . zolpidem (AMBIEN) 5 MG tablet at bedtime as needed.        No current facility-administered medications for this visit.    Allergies:    Allergies  Allergen Reactions  . Penicillins Swelling  . Simvastatin Other (See Comments)    Muscle weakness in legs ?    Social History:  The patient  reports that he has never smoked. He does not have any smokeless tobacco history on file. He reports that he does not drink alcohol or use illicit drugs.   ROS:  Please see the history of present illness.      All other systems reviewed and negative.   OBJECTIVE: VS:  BP 116/76  Pulse 65  Ht 5\' 10"  (1.778 m)  SpO2 94% Well nourished, well developed, in no acute distress, appears frightened HEENT: normal Neck: JVD difficult to assess. Carotid bruit faint the left  Cardiac:  normal S1, S2; RRR; no murmur Lungs:   markedly decreased breath sounds on the right compared to left. No dullness to percussion.  Abd: soft, nontender, no hepatomegaly Ext: Edema  Absent . Pulses 2+  bilateral  Skin: warm and dry Neuro:  CNs 2-12 intact, no focal abnormalities noted  EKG:   February 21, 2014:  A pacing at 65. Inc. RBBB    Signed, Illene Labrador III, MD 02/21/2014 3:19 PM

## 2014-02-21 NOTE — Patient Instructions (Signed)
Your physician has recommended you make the following change in your medication:  DECREASE Aspirin to 81 mg once daily  Your physician recommends that you have lab work:  TODAY  Your physician has requested that you have a [place]. For further information please visit [email or web address]. Please follow instruction sheet, as given.  Your physician has requested that you have an echocardiogram. Echocardiography is a painless test that uses sound waves to create images of your heart. It provides your doctor with information about the size and shape of your heart and how well your heart's chambers and valves are working. This procedure takes approximately one hour. There are no restrictions for this procedure.   Your physician recommends that you keep your follow-up appointment with Dr. Tamala Julian on September 9.  Call our office before this date with worsening symptoms or concerns ([phone])

## 2014-02-21 NOTE — Progress Notes (Signed)
Pacemaker check in clinic. Normal device function. Thresholds, sensing, impedances consistent with previous measurements. Device programmed to maximize longevity. No mode switch or high ventricular rates noted. Device programmed at appropriate safety margins. Histogram distribution appropriate for patient activity level. Device programmed to optimize intrinsic conduction. Estimated longevity 2.78yrs. ROV w/ Dr. Caryl Comes Jan/2016.    Pt seeing DOD due to chest pain x15mo & peripheral edema although furosemide was increased 02/09/14.

## 2014-02-22 ENCOUNTER — Ambulatory Visit (HOSPITAL_COMMUNITY): Payer: Medicare Other | Attending: Cardiovascular Disease | Admitting: Radiology

## 2014-02-22 ENCOUNTER — Ambulatory Visit (HOSPITAL_BASED_OUTPATIENT_CLINIC_OR_DEPARTMENT_OTHER): Payer: Medicare Other | Admitting: Radiology

## 2014-02-22 VITALS — BP 139/77 | HR 65 | Ht 70.0 in | Wt 214.0 lb

## 2014-02-22 DIAGNOSIS — I209 Angina pectoris, unspecified: Secondary | ICD-10-CM

## 2014-02-22 DIAGNOSIS — R0600 Dyspnea, unspecified: Secondary | ICD-10-CM

## 2014-02-22 DIAGNOSIS — G4733 Obstructive sleep apnea (adult) (pediatric): Secondary | ICD-10-CM | POA: Diagnosis not present

## 2014-02-22 DIAGNOSIS — R0602 Shortness of breath: Secondary | ICD-10-CM | POA: Diagnosis not present

## 2014-02-22 DIAGNOSIS — I708 Atherosclerosis of other arteries: Secondary | ICD-10-CM | POA: Diagnosis not present

## 2014-02-22 DIAGNOSIS — I517 Cardiomegaly: Secondary | ICD-10-CM | POA: Diagnosis not present

## 2014-02-22 DIAGNOSIS — R5383 Other fatigue: Secondary | ICD-10-CM

## 2014-02-22 DIAGNOSIS — R609 Edema, unspecified: Secondary | ICD-10-CM | POA: Diagnosis not present

## 2014-02-22 DIAGNOSIS — E669 Obesity, unspecified: Secondary | ICD-10-CM | POA: Diagnosis not present

## 2014-02-22 DIAGNOSIS — R51 Headache: Secondary | ICD-10-CM

## 2014-02-22 DIAGNOSIS — I509 Heart failure, unspecified: Secondary | ICD-10-CM

## 2014-02-22 DIAGNOSIS — Z8249 Family history of ischemic heart disease and other diseases of the circulatory system: Secondary | ICD-10-CM | POA: Insufficient documentation

## 2014-02-22 DIAGNOSIS — I359 Nonrheumatic aortic valve disorder, unspecified: Secondary | ICD-10-CM | POA: Insufficient documentation

## 2014-02-22 DIAGNOSIS — I2510 Atherosclerotic heart disease of native coronary artery without angina pectoris: Secondary | ICD-10-CM | POA: Diagnosis not present

## 2014-02-22 DIAGNOSIS — I4891 Unspecified atrial fibrillation: Secondary | ICD-10-CM

## 2014-02-22 DIAGNOSIS — I25119 Atherosclerotic heart disease of native coronary artery with unspecified angina pectoris: Secondary | ICD-10-CM

## 2014-02-22 DIAGNOSIS — R079 Chest pain, unspecified: Secondary | ICD-10-CM

## 2014-02-22 LAB — CBC WITH DIFFERENTIAL/PLATELET
BASOS PCT: 0.2 % (ref 0.0–3.0)
Basophils Absolute: 0 10*3/uL (ref 0.0–0.1)
EOS PCT: 2.2 % (ref 0.0–5.0)
Eosinophils Absolute: 0.2 10*3/uL (ref 0.0–0.7)
HCT: 35.1 % — ABNORMAL LOW (ref 39.0–52.0)
HEMOGLOBIN: 11.6 g/dL — AB (ref 13.0–17.0)
LYMPHS PCT: 30.8 % (ref 12.0–46.0)
Lymphs Abs: 2.9 10*3/uL (ref 0.7–4.0)
MCHC: 33.2 g/dL (ref 30.0–36.0)
MCV: 92.7 fl (ref 78.0–100.0)
Monocytes Absolute: 0.5 10*3/uL (ref 0.1–1.0)
Monocytes Relative: 5.5 % (ref 3.0–12.0)
NEUTROS ABS: 5.7 10*3/uL (ref 1.4–7.7)
Neutrophils Relative %: 61.3 % (ref 43.0–77.0)
Platelets: 150 10*3/uL (ref 150.0–400.0)
RBC: 3.79 Mil/uL — AB (ref 4.22–5.81)
RDW: 14.5 % (ref 11.5–15.5)
WBC: 9.4 10*3/uL (ref 4.0–10.5)

## 2014-02-22 LAB — HEPATIC FUNCTION PANEL
ALT: 19 U/L (ref 0–53)
AST: 24 U/L (ref 0–37)
Albumin: 3.8 g/dL (ref 3.5–5.2)
Alkaline Phosphatase: 62 U/L (ref 39–117)
BILIRUBIN DIRECT: 0.1 mg/dL (ref 0.0–0.3)
BILIRUBIN TOTAL: 0.4 mg/dL (ref 0.2–1.2)
TOTAL PROTEIN: 7.2 g/dL (ref 6.0–8.3)

## 2014-02-22 LAB — BASIC METABOLIC PANEL
BUN: 38 mg/dL — ABNORMAL HIGH (ref 6–23)
CALCIUM: 10.2 mg/dL (ref 8.4–10.5)
CO2: 29 mEq/L (ref 19–32)
Chloride: 102 mEq/L (ref 96–112)
Creatinine, Ser: 1.6 mg/dL — ABNORMAL HIGH (ref 0.4–1.5)
GFR: 44.54 mL/min — AB (ref >60.00)
Glucose, Bld: 101 mg/dL — ABNORMAL HIGH (ref 70–99)
Potassium: 4.2 mEq/L (ref 3.5–5.1)
SODIUM: 139 meq/L (ref 135–145)

## 2014-02-22 LAB — BRAIN NATRIURETIC PEPTIDE: Pro B Natriuretic peptide (BNP): 72 pg/mL (ref 0.0–100.0)

## 2014-02-22 LAB — TSH: TSH: 1.47 u[IU]/mL (ref 0.35–4.50)

## 2014-02-22 MED ORDER — TECHNETIUM TC 99M SESTAMIBI GENERIC - CARDIOLITE
10.0000 | Freq: Once | INTRAVENOUS | Status: AC | PRN
  Administered 2014-02-22: 10 via INTRAVENOUS

## 2014-02-22 MED ORDER — REGADENOSON 0.4 MG/5ML IV SOLN
0.4000 mg | Freq: Once | INTRAVENOUS | Status: AC
  Administered 2014-02-22: 0.4 mg via INTRAVENOUS

## 2014-02-22 MED ORDER — AMINOPHYLLINE 25 MG/ML IV SOLN
75.0000 mg | Freq: Once | INTRAVENOUS | Status: AC
  Administered 2014-02-22: 75 mg via INTRAVENOUS

## 2014-02-22 MED ORDER — TECHNETIUM TC 99M SESTAMIBI GENERIC - CARDIOLITE
30.0000 | Freq: Once | INTRAVENOUS | Status: AC | PRN
  Administered 2014-02-22: 30 via INTRAVENOUS

## 2014-02-22 NOTE — Progress Notes (Signed)
[facility] [address] [place], [place] [address] [phone]    Cardiology Nuclear Med Study  OISIN YOAKUM is a 80 y.o. male     MRN : [id number]     DOB: 1927-02-25  Procedure Date: 02/22/2014  Nuclear Med Background Indication for Stress Test:  Evaluation for Ischemia, Graft Patency and Stent Patency History:  CAD, MI, CABG, Stent,PTVP, and 2011 Myocardial Perfusion Imaging-Normal, EF=62% Cardiac Risk Factors: Carotid Disease, Strong Family History - CAD, Hypertension, Lipids, and PVD  Symptoms:  Dizziness, DOE, SOB and Pain in upper back and shoulders   Nuclear Pre-Procedure Caffeine/Decaff Intake:  None> 12 hrs NPO After: 6:30am   Lungs:  clear O2 Sat: 96% on room air. IV 0.9% NS with Angio Cath:  22g  IV Site: R Wrist x 1, tolerated well IV Started by:  Irven Baltimore, RN  Chest Size (in):  44 Cup Size: n/a  Height: 5\' 10"  (1.778 m)  Weight:  214 lb (97.07 kg)  BMI:  Body mass index is 30.71 kg/(m^2). Tech Comments:  N/A    Nuclear Med Study 1 or 2 day study: 1 day  Stress Test Type:  Lexiscan  Reading MD: N/A  Order Authorizing Provider:  Mertie Moores, MD, and Daneen Schick III, MD  Resting Radionuclide: Technetium 58m Sestamibi  Resting Radionuclide Dose: 11.0 mCi   Stress Radionuclide:  Technetium 56m Sestamibi  Stress Radionuclide Dose: 33.0 mCi           Stress Protocol Rest HR: 65 Stress HR: 69  Rest BP: 139/77 Stress BP: 155/80  Exercise Time (min): n/a METS: n/a   Predicted Max HR: 136 bpm % Max HR: 50.74 bpm Rate Pressure Product: 10695   Dose of Adenosine (mg):  n/a Dose of Lexiscan: 0.4 mg  Dose of Atropine (mg): n/a Dose of Dobutamine: n/a mcg/kg/min (at max HR)  Stress Test Technologist: Irven Baltimore, RN  Nuclear Technologist:  Annye Rusk, CNMT     Rest Procedure:  Myocardial perfusion imaging was performed at rest 45 minutes following the intravenous administration of Technetium 72m Sestamibi. Rest ECG:  NSR, nonspecific IVCD  Stress Procedure:  The patient received IV Lexiscan 0.4 mg over 15-seconds.  Technetium 58m Sestamibi injected at 30-seconds.  The patient complained of SOB, Lightheadedness, weakness in legs, stomach pain, nausea, and headache with lexiscan.Quantitative spect images were obtained after a 45 minute delay. Aminophylline 75 mg IVP given post recovery at 1402 due to persistent headache, and nausea with quick resolution of symptoms.  Stress ECG: No significant ST segment change suggestive of ischemia. and There are scattered PVCs.  QPS Raw Data Images:  Normal; no motion artifact; normal heart/lung ratio. Stress Images:  There is a moderate size , mderate severity inferior wall defect. There is a very small, moderate severity apical "cap" defect. Rest Images:  Comparison with the stress images reveals no significant change. Subtraction (SDS):  There are fixed defects that are most consistent with previous infarction/scar. Transient Ischemic Dilatation (Normal <1.22):  1.15 Lung/Heart Ratio (Normal <0.45):  0.28  Quantitative Gated Spect Images QGS EDV:  163 ml QGS ESV:  84 ml  Impression Exercise Capacity:  Lexiscan with no exercise. BP Response:  Normal blood pressure response. Clinical Symptoms:  No significant symptoms noted. ECG Impression:  No significant ECG changes with Lexiscan. Comparison with Prior Nuclear Study: No images to compare  Overall Impression:  Low risk stress nuclear study with a moderate inferior wall scar and a  very small apical scar. There is no significant reversible ischemia.  LV Ejection Fraction: 48%.  LV Wall Motion:  mildly depressed overall LV systolic function, mild inferior wall hypokinesis  Sanda Klein, MD, [facility] [phone] office [phone] pager

## 2014-02-22 NOTE — Progress Notes (Signed)
Echocardiogram performed.  

## 2014-02-27 ENCOUNTER — Telehealth: Payer: Self-pay | Admitting: Cardiovascular Disease

## 2014-02-27 NOTE — Telephone Encounter (Signed)
New message ° ° ° ° °Want stress test results °

## 2014-02-27 NOTE — Telephone Encounter (Signed)
Pt advised lexiscan myoview results 02/22/14 not reviewed by MD, but no evidence of significant reversible ischemia.

## 2014-03-06 ENCOUNTER — Encounter (HOSPITAL_COMMUNITY): Payer: Medicare Other

## 2014-03-06 ENCOUNTER — Other Ambulatory Visit (HOSPITAL_COMMUNITY): Payer: Medicare Other

## 2014-03-23 ENCOUNTER — Other Ambulatory Visit: Payer: Self-pay | Admitting: Geriatric Medicine

## 2014-03-23 ENCOUNTER — Encounter: Payer: Self-pay | Admitting: Internal Medicine

## 2014-03-23 DIAGNOSIS — M545 Low back pain, unspecified: Secondary | ICD-10-CM | POA: Diagnosis not present

## 2014-03-23 DIAGNOSIS — G4730 Sleep apnea, unspecified: Secondary | ICD-10-CM | POA: Diagnosis not present

## 2014-03-23 DIAGNOSIS — R0602 Shortness of breath: Secondary | ICD-10-CM | POA: Diagnosis not present

## 2014-03-23 DIAGNOSIS — R609 Edema, unspecified: Secondary | ICD-10-CM | POA: Diagnosis not present

## 2014-03-27 ENCOUNTER — Ambulatory Visit
Admission: RE | Admit: 2014-03-27 | Discharge: 2014-03-27 | Disposition: A | Discharge status: Home / Self Care | Payer: Medicare Other | Source: Ambulatory Visit | Attending: Geriatric Medicine | Admitting: Geriatric Medicine

## 2014-03-27 DIAGNOSIS — N189 Chronic kidney disease, unspecified: Secondary | ICD-10-CM | POA: Diagnosis not present

## 2014-03-27 DIAGNOSIS — M545 Low back pain, unspecified: Secondary | ICD-10-CM

## 2014-04-04 ENCOUNTER — Ambulatory Visit: Payer: Medicare Other | Admitting: Cardiovascular Disease

## 2014-04-04 ENCOUNTER — Ambulatory Visit (INDEPENDENT_AMBULATORY_CARE_PROVIDER_SITE_OTHER): Payer: Medicare Other | Admitting: Interventional Cardiology

## 2014-04-04 ENCOUNTER — Encounter: Payer: Self-pay | Admitting: Interventional Cardiology

## 2014-04-04 ENCOUNTER — Ambulatory Visit: Payer: Medicare Other | Admitting: Interventional Cardiology

## 2014-04-04 VITALS — BP 142/70 | HR 70 | Ht 73.0 in | Wt 217.0 lb

## 2014-04-04 DIAGNOSIS — I482 Chronic atrial fibrillation, unspecified: Secondary | ICD-10-CM

## 2014-04-04 DIAGNOSIS — E785 Hyperlipidemia, unspecified: Secondary | ICD-10-CM

## 2014-04-04 DIAGNOSIS — I495 Sick sinus syndrome: Secondary | ICD-10-CM | POA: Diagnosis not present

## 2014-04-04 DIAGNOSIS — I2510 Atherosclerotic heart disease of native coronary artery without angina pectoris: Secondary | ICD-10-CM | POA: Diagnosis not present

## 2014-04-04 DIAGNOSIS — I4891 Unspecified atrial fibrillation: Secondary | ICD-10-CM

## 2014-04-04 DIAGNOSIS — I10 Essential (primary) hypertension: Secondary | ICD-10-CM

## 2014-04-04 DIAGNOSIS — Z45018 Encounter for adjustment and management of other part of cardiac pacemaker: Secondary | ICD-10-CM

## 2014-04-04 MED ORDER — NITROGLYCERIN 0.4 MG/SPRAY TL SOLN: 1.0000 | Status: DC | PRN

## 2014-04-04 NOTE — Progress Notes (Signed)
Patient ID: Edward Woodward, male   DOB: 1926/10/21, 80 y.o.   MRN: [id number]    [address] [place],   [address] Phone: [phone] Fax:  [phone]  Date:  04/04/2014   ID:  Edward Woodward, DOB October 28, 1926, MRN [id number]  PCP:  Mathews Argyle, MD   ASSESSMENT:  1. Coronary artery disease with recent low risk myocardial perfusion study. Since perfusion study he has had 2 additional episodes. Each lasted less than 15 minutes. 2. Hypertension 3. Obstructive sleep apnea  PLAN:  1. Nitroglycerin sublingual spray of recurring episodes of chest and shoulder discomfort 2. Clinical observation   SUBJECTIVE: Edward Woodward is a 80 y.o. male is here in followup. He had had several episodes of neck shoulder and upper chest discomfort. He was seen by Dr. Dub Mikes her in July in my absence. He had a low risk myocardial perfusion study and an echocardiogram was also performed that revealed normal LV function. Since that evaluation the patient has had perhaps 2 episodes that have lasted up to 10 minutes. The episodes resolve spontaneously. They are not exertion related. He has not tried nitroglycerin. He feels fine today. He denies dyspnea. No significant lower extremity swelling.   Wt Readings from Last 3 Encounters:  04/04/14 217 lb (98.431 kg)  02/22/14 214 lb (97.07 kg)  02/21/14 214 lb (97.07 kg)     Past Medical History  Diagnosis Date  . Cancer   . Prostate cancer   . Neuropathy   . Hypercholesteremia   . Depressive disorder, not elsewhere classified   . Angina pectoris   . Sinoatrial node dysfunction   . Degeneration of lumbar or lumbosacral intervertebral disc   . Dyspnea   . CAD (coronary artery disease)      NYHA Class 1B, CABG 1985  . HTN (hypertension)   . OSA on CPAP     Current Outpatient Prescriptions  Medication Sig Dispense Refill  . aspirin EC 81 MG tablet Take 1 tablet (81 mg total) by mouth daily.      . ATORVASTATIN CALCIUM PO  Take 20 mg by mouth every other day. Unsure of dose      . b complex vitamins tablet Take 1 tablet by mouth daily.        . Cholecalciferol (VITAMIN D3) 2000 UNITS TABS Take 1 tablet by mouth daily.      . citalopram (CELEXA) 20 MG tablet Take 20 mg by mouth daily.        . clopidogrel (PLAVIX) 75 MG tablet Take 75 mg by mouth daily.        Marland Kitchen docusate sodium (COLACE) 100 MG capsule Take 100 mg by mouth 2 (two) times daily.        . furosemide (LASIX) 40 MG tablet Take 2 tablets (80 mg total) by mouth daily.  60 tablet  3  . gabapentin (NEURONTIN) 300 MG capsule Take 300 mg by mouth 2 (two) times daily. 2 times daily w/ additional dose PRN, not to exceed TID      . hydroxypropyl methylcellulose (ISOPTO TEARS) 2.5 % ophthalmic solution 1 drop.      . isosorbide mononitrate (IMDUR) 30 MG 24 hr tablet Take 1 tablet (30 mg total) by mouth daily.  90 tablet  1  . Multiple Vitamin (MULTIVITAMIN) capsule Take 1 capsule by mouth daily.      . nitroGLYCERIN (NITROSTAT) 0.4 MG SL tablet Place 1 tablet (0.4 mg total) under the tongue every  5 (five) minutes as needed for chest pain.  25 tablet  3  . Nutritional Supplements (MELATONIN PO) Take 1 tablet by mouth at bedtime.        Marland Kitchen UBIQUINOL PO Take 1 tablet by mouth daily. CoQ10       No current facility-administered medications for this visit.    Allergies:    Allergies  Allergen Reactions  . Penicillins Swelling  . Simvastatin Other (See Comments)    Muscle weakness in legs ?    Social History:  The patient  reports that he has never smoked. He does not have any smokeless tobacco history on file. He reports that he does not drink alcohol or use illicit drugs.   ROS:  Please see the history of present illness.   There is no orthopnea. He denies claudication. He has not had palpitations or syncope.   All other systems reviewed and negative.   OBJECTIVE: VS:  BP 142/70  Pulse 70  Ht 6\' 1"  (1.854 m)  Wt 217 lb (98.431 kg)  BMI 28.64 kg/m2 Well  nourished, well developed, in no acute distress, elderly and in no acute discomfort HEENT: normal Neck: JVD flat. Carotid bruit absent  Cardiac:  normal S1, S2; RRR; no murmur Lungs:  clear to auscultation bilaterally, no wheezing, rhonchi or rales Abd: soft, nontender, no hepatomegaly Ext: Edema trace bilateral lower extremity. Pulses 2+ Skin: warm and dry Neuro:  CNs 2-12 intact, no focal abnormalities noted  EKG:  EKG is not repeated       Signed, Illene Labrador III, MD 04/04/2014 4:16 PM

## 2014-04-04 NOTE — Patient Instructions (Signed)
Your physician has recommended you make the following change in your medication:  1) An RX for Nitro Sublingual spray has been sent to your pharmacy. Use as directed  Take all other medications as prescribed  Your physician wants you to follow-up in: 6 months with Dr.Smith You will receive a reminder letter in the mail two months in advance. If you don't receive a letter, please call our office to schedule the follow-up appointment.

## 2014-04-20 DIAGNOSIS — R0602 Shortness of breath: Secondary | ICD-10-CM | POA: Diagnosis not present

## 2014-04-23 DIAGNOSIS — Z23 Encounter for immunization: Secondary | ICD-10-CM | POA: Diagnosis not present

## 2014-04-26 DEATH — deceased

## 2014-04-30 DIAGNOSIS — C8590 Non-Hodgkin lymphoma, unspecified, unspecified site: Secondary | ICD-10-CM | POA: Diagnosis not present

## 2014-04-30 DIAGNOSIS — N3941 Urge incontinence: Secondary | ICD-10-CM | POA: Insufficient documentation

## 2014-04-30 DIAGNOSIS — E291 Testicular hypofunction: Secondary | ICD-10-CM | POA: Diagnosis not present

## 2014-04-30 DIAGNOSIS — R32 Unspecified urinary incontinence: Secondary | ICD-10-CM | POA: Diagnosis not present

## 2014-04-30 DIAGNOSIS — R972 Elevated prostate specific antigen [PSA]: Secondary | ICD-10-CM | POA: Diagnosis not present

## 2014-04-30 DIAGNOSIS — C61 Malignant neoplasm of prostate: Secondary | ICD-10-CM | POA: Diagnosis not present

## 2014-05-03 DIAGNOSIS — C61 Malignant neoplasm of prostate: Secondary | ICD-10-CM | POA: Diagnosis not present

## 2014-05-16 DIAGNOSIS — R0600 Dyspnea, unspecified: Secondary | ICD-10-CM | POA: Diagnosis not present

## 2014-05-16 DIAGNOSIS — N183 Chronic kidney disease, stage 3 (moderate): Secondary | ICD-10-CM | POA: Diagnosis not present

## 2014-05-16 DIAGNOSIS — G4733 Obstructive sleep apnea (adult) (pediatric): Secondary | ICD-10-CM | POA: Diagnosis not present

## 2014-05-16 DIAGNOSIS — I129 Hypertensive chronic kidney disease with stage 1 through stage 4 chronic kidney disease, or unspecified chronic kidney disease: Secondary | ICD-10-CM | POA: Diagnosis not present

## 2014-06-01 ENCOUNTER — Other Ambulatory Visit: Payer: Self-pay | Admitting: Interventional Cardiology

## 2014-06-01 MED ORDER — ISOSORBIDE MONONITRATE ER 30 MG PO TB24: 30.0000 mg | ORAL_TABLET | Freq: Every day | ORAL | Status: DC

## 2014-06-05 ENCOUNTER — Ambulatory Visit: Payer: Medicare Other | Admitting: Oncology

## 2014-06-05 ENCOUNTER — Other Ambulatory Visit: Payer: Medicare Other

## 2014-06-08 ENCOUNTER — Telehealth: Payer: Self-pay | Admitting: *Deleted

## 2014-06-08 ENCOUNTER — Other Ambulatory Visit: Payer: Self-pay | Admitting: *Deleted

## 2014-06-08 NOTE — Telephone Encounter (Signed)
Called pt with change in appt. He agreed to 11/17 at 3:45 & wrote appt down.

## 2014-06-11 ENCOUNTER — Other Ambulatory Visit: Payer: Medicare Other

## 2014-06-11 ENCOUNTER — Ambulatory Visit: Payer: Medicare Other | Admitting: Oncology

## 2014-06-11 ENCOUNTER — Telehealth: Payer: Self-pay | Admitting: Oncology

## 2014-06-11 NOTE — Telephone Encounter (Signed)
s.w. pt and advised on todays appt cx and moved to tomorrow....pt ok and aware of new d.t

## 2014-06-12 ENCOUNTER — Telehealth: Payer: Self-pay | Admitting: Oncology

## 2014-06-12 ENCOUNTER — Ambulatory Visit (HOSPITAL_BASED_OUTPATIENT_CLINIC_OR_DEPARTMENT_OTHER): Payer: Medicare Other | Admitting: Oncology

## 2014-06-12 ENCOUNTER — Other Ambulatory Visit (HOSPITAL_BASED_OUTPATIENT_CLINIC_OR_DEPARTMENT_OTHER): Payer: Medicare Other

## 2014-06-12 VITALS — BP 174/73 | HR 65 | Temp 97.8°F | Resp 22 | Ht 73.0 in | Wt 221.8 lb

## 2014-06-12 DIAGNOSIS — C61 Malignant neoplasm of prostate: Secondary | ICD-10-CM

## 2014-06-12 DIAGNOSIS — C8590 Non-Hodgkin lymphoma, unspecified, unspecified site: Secondary | ICD-10-CM

## 2014-06-12 DIAGNOSIS — C8599 Non-Hodgkin lymphoma, unspecified, extranodal and solid organ sites: Secondary | ICD-10-CM

## 2014-06-12 LAB — CBC WITH DIFFERENTIAL/PLATELET
BASO%: 0.4 % (ref 0.0–2.0)
Basophils Absolute: 0 10*3/uL (ref 0.0–0.1)
EOS%: 3.1 % (ref 0.0–7.0)
Eosinophils Absolute: 0.2 10*3/uL (ref 0.0–0.5)
HEMATOCRIT: 35.6 % — AB (ref 38.4–49.9)
HGB: 11.5 g/dL — ABNORMAL LOW (ref 13.0–17.1)
LYMPH%: 36.3 % (ref 14.0–49.0)
MCH: 29.9 pg (ref 27.2–33.4)
MCHC: 32.3 g/dL (ref 32.0–36.0)
MCV: 92.5 fL (ref 79.3–98.0)
MONO#: 0.6 10*3/uL (ref 0.1–0.9)
MONO%: 7 % (ref 0.0–14.0)
NEUT#: 4.2 10*3/uL (ref 1.5–6.5)
NEUT%: 53.2 % (ref 39.0–75.0)
PLATELETS: 147 10*3/uL (ref 140–400)
RBC: 3.85 10*6/uL — AB (ref 4.20–5.82)
RDW: 15.3 % — ABNORMAL HIGH (ref 11.0–14.6)
WBC: 8 10*3/uL (ref 4.0–10.3)
lymph#: 2.9 10*3/uL (ref 0.9–3.3)

## 2014-06-12 LAB — BASIC METABOLIC PANEL (CC13)
ANION GAP: 9 meq/L (ref 3–11)
BUN: 33.3 mg/dL — ABNORMAL HIGH (ref 7.0–26.0)
CALCIUM: 10.4 mg/dL (ref 8.4–10.4)
CO2: 26 mEq/L (ref 22–29)
CREATININE: 1.6 mg/dL — AB (ref 0.7–1.3)
Chloride: 106 mEq/L (ref 98–109)
Glucose: 125 mg/dl (ref 70–140)
Potassium: 4.2 mEq/L (ref 3.5–5.1)
SODIUM: 141 meq/L (ref 136–145)

## 2014-06-12 NOTE — Telephone Encounter (Signed)
Gave avs & cal for March 2016. °

## 2014-06-12 NOTE — Progress Notes (Signed)
  [place] OFFICE PROGRESS NOTE   Diagnosis: non-Hodgkin's lymphoma, prostate cancer  INTERVAL HISTORY:   Edward Woodward returns as scheduled. He reports 2 recent episodes of chest pain. He reports a negative cardiology evaluation. He is concerned that his weight has been fluctuating on his home scale. He reports a good appetite.he has joined an exercise gym.  Objective:  Vital signs in last 24 hours:  Blood pressure 174/73, pulse 65, temperature 97.8 F (36.6 C), temperature source Oral, resp. rate 22, height 6\' 1"  (1.854 m), weight 221 lb 12.8 oz (100.608 kg), SpO2 98 %.    HEENT: neck without mass Lymphatics: no cervical, supraclavicular, axillary, or inguinal nodes Resp: coarse rhonchi at the posterior base bilaterally, no respiratory distress Cardio: regular rate and rhythm GI: no hepatomegaly, nontender Vascular: trace edema at the left greater than the right lower leg      Lab Results:  Lab Results  Component Value Date   WBC 8.0 06/12/2014   HGB 11.5* 06/12/2014   HCT 35.6* 06/12/2014   MCV 92.5 06/12/2014   PLT 147 06/12/2014   NEUTROABS 4.2 06/12/2014     Imaging:  No results found.  Medications: I have reviewed the patient's current medications.  Assessment/Plan: 1. Non-Hodgkin's lymphoma (follicular grade 3 lymphoma) - status post 6 cycles of Cytoxan/prednisone/rituximab 07/01/2007 through 10/21/2007. He remains in clinical remission. 2. Prostate cancer - he has been treated with hormonal therapy, the PSA has been slowly rising, we will follow up on the PSA from today 3. History of a normocytic anemia secondary to non-Hodgkin's lymphoma, chemotherapy, and renal insufficiency - stable  4. History of Mild thrombocytopenia  5. History of bilateral hydronephrosis. 6. Coronary artery disease - status post coronary artery stent placement. 7. History of noncalcified lung nodules. 8. History of severe neutropenia July 2009 - likely related to  delayed toxicity from rituximab. 9. Macular degeneration. 10. Right middle lobe density on a CT of the chest 06/18/2010 measuring 2.5 x 1.6 cm with mildly increased metabolic activity (SUV max 2.5) on a PET scan 06/27/2010. The area of increased density was less confluent and appeared smaller on a restaging CT 09/08/2010. Right middle lobe "scarring "with no new or suspicious pulmonary nodule on a CT 08/04/2011. 11. Mild increased metabolic activity associated with several lymph nodes on the PET scan 06/27/2010 - likely related to lymphoma. No lymphadenopathy in the mediastinum or axillary areas on the CT 08/04/2011. 12. Right hip discomfort-he received a steroid injection by his primary physician without improvement. He saw Edward Woodward and there was a question of a lytic process in the right pelvis. A bone scan on 09/21/2012 revealed no evidence of metastatic disease. Bone scan 10/19/2013 consistent with osteoarthritis at the right hip   Disposition:  Edward Woodward appears stable. There is no clinical evidence for progression of lymphoma or prostate cancer. He will return for an office and lab visit in 4 months. He continues Lupron therapy with Edward Woodward.  Edward Coder, MD  06/12/2014  4:50 PM

## 2014-06-13 LAB — PSA: PSA: 14.98 ng/mL — ABNORMAL HIGH (ref <=4.00)

## 2014-06-29 ENCOUNTER — Other Ambulatory Visit: Payer: Self-pay

## 2014-06-29 MED ORDER — FUROSEMIDE 40 MG PO TABS: 80.0000 mg | ORAL_TABLET | Freq: Every day | ORAL | Status: DC

## 2014-07-09 ENCOUNTER — Telehealth: Payer: Self-pay | Admitting: Interventional Cardiology

## 2014-07-09 MED ORDER — METOPROLOL SUCCINATE ER 25 MG PO TB24: 25.0000 mg | ORAL_TABLET | Freq: Every day | ORAL | Status: DC

## 2014-07-09 NOTE — Telephone Encounter (Signed)
Pt given Dayna Dunn,PA recommendation Due to advanced age and dx Non-Hodkins lymphoma We should treat conservatively with meds Pt should start Metoprolol Succ 25mg  daily. Pt should continue to monitor bp daily, pt is to call if bp is consistently elevated. Pt may take Tylenol prn for headache Rx sent to pt pharmacy. Pt agreeable and verbalized understanding.

## 2014-07-09 NOTE — Telephone Encounter (Signed)
Returned pt call. Pt reports that his bp has been consistently elevated over the last couple of weeks. 7 day bp's are as followed 190/95 157/80 188/94 109/75 122/94 149/70 161/88 121/64 188/91 yesterday 153/86 this morning Pt denies chest pain, sob, dizziness, swelling, fever,chills. He reports that he has had a headache the last couple of days He has had a intermittent tingle in his left elbow over the last couple weeks. It does not radiate. He has not needed to use Nitro. Adv pt that Dr.Smith is currently out of the office. I will talk with another provider in the office and call back with their recommendation. Pt verbalized understanding.

## 2014-07-09 NOTE — Telephone Encounter (Signed)
New message      Pt says his bp was 183/91 last night; now 152/86 this am.  No dizziness or headache.  Please advise

## 2014-08-24 DIAGNOSIS — L304 Erythema intertrigo: Secondary | ICD-10-CM | POA: Diagnosis not present

## 2014-08-24 DIAGNOSIS — D1801 Hemangioma of skin and subcutaneous tissue: Secondary | ICD-10-CM | POA: Diagnosis not present

## 2014-08-24 DIAGNOSIS — Z85828 Personal history of other malignant neoplasm of skin: Secondary | ICD-10-CM | POA: Diagnosis not present

## 2014-08-24 DIAGNOSIS — D225 Melanocytic nevi of trunk: Secondary | ICD-10-CM | POA: Diagnosis not present

## 2014-08-24 DIAGNOSIS — L821 Other seborrheic keratosis: Secondary | ICD-10-CM | POA: Diagnosis not present

## 2014-08-24 DIAGNOSIS — L218 Other seborrheic dermatitis: Secondary | ICD-10-CM | POA: Diagnosis not present

## 2014-08-28 ENCOUNTER — Ambulatory Visit (INDEPENDENT_AMBULATORY_CARE_PROVIDER_SITE_OTHER): Payer: Medicare Other | Admitting: Internal Medicine

## 2014-08-28 ENCOUNTER — Encounter: Payer: Self-pay | Admitting: Internal Medicine

## 2014-08-28 VITALS — BP 109/70 | HR 67 | Ht 70.0 in | Wt 222.2 lb

## 2014-08-28 DIAGNOSIS — I2510 Atherosclerotic heart disease of native coronary artery without angina pectoris: Secondary | ICD-10-CM | POA: Diagnosis not present

## 2014-08-28 DIAGNOSIS — I4891 Unspecified atrial fibrillation: Secondary | ICD-10-CM | POA: Diagnosis not present

## 2014-08-28 DIAGNOSIS — Z45010 Encounter for checking and testing of cardiac pacemaker pulse generator [battery]: Secondary | ICD-10-CM

## 2014-08-28 DIAGNOSIS — I495 Sick sinus syndrome: Secondary | ICD-10-CM | POA: Diagnosis not present

## 2014-08-28 DIAGNOSIS — I10 Essential (primary) hypertension: Secondary | ICD-10-CM | POA: Diagnosis not present

## 2014-08-28 DIAGNOSIS — Z45018 Encounter for adjustment and management of other part of cardiac pacemaker: Secondary | ICD-10-CM

## 2014-08-28 LAB — MDC_IDC_ENUM_SESS_TYPE_INCLINIC
Brady Statistic RV Percent Paced: 3 %
Date Time Interrogation Session: 20160202050000
Lead Channel Impedance Value: 580 Ohm
Lead Channel Pacing Threshold Amplitude: 1.1 V
Lead Channel Pacing Threshold Pulse Width: 0.4 ms
Lead Channel Sensing Intrinsic Amplitude: 12 mV
Lead Channel Setting Pacing Pulse Width: 0.8 ms
Lead Channel Setting Sensing Sensitivity: 2.5 mV
MDC IDC MSMT LEADCHNL RA PACING THRESHOLD AMPLITUDE: 0.9 V
MDC IDC MSMT LEADCHNL RA SENSING INTR AMPL: 2 mV
MDC IDC MSMT LEADCHNL RV IMPEDANCE VALUE: 500 Ohm
MDC IDC MSMT LEADCHNL RV PACING THRESHOLD PULSEWIDTH: 0.8 ms
MDC IDC PG SERIAL: 317986
MDC IDC SET LEADCHNL RA PACING AMPLITUDE: 2 V
MDC IDC SET LEADCHNL RV PACING AMPLITUDE: 2.4 V
MDC IDC STAT BRADY RA PERCENT PACED: 96 %

## 2014-08-28 NOTE — Patient Instructions (Signed)
Your physician has recommended you make the following change in your medication:  1) Take your Metoprolol succinate at supper time  Your physician wants you to follow-up in: 6 months with device clinic.  You will receive a reminder letter in the mail two months in advance. If you don't receive a letter, please call our office to schedule the follow-up appointment.  Your physician wants you to follow-up in: 1 year with Dr. Caryl Comes.  You will receive a reminder letter in the mail two months in advance. If you don't receive a letter, please call our office to schedule the follow-up appointment.

## 2014-08-28 NOTE — Progress Notes (Signed)
Patient Care Team: Lajean Manes, MD as PCP - General (Internal Medicine) Ladell Pier, MD as Attending Physician (Hematology and Oncology) Myrlene Broker, MD as Attending Physician (Urology)   HPI  Edward Woodward is a 81 y.o. male Seen to establish pacemaker followup needed number of years ago for sinus node dysfunction and presyncope. He has alleviated his symptoms.  He also has a history of coronary artery disease with mild left ventricular dysfunction with EF of 50% 2011.  He has no history of atrial fibrillation but was documented time of his pneumothorax that was a consequence of a fall 12/14 .  Functional status is stable.    Past Medical History  Diagnosis Date  . Cancer   . Prostate cancer   . Neuropathy   . Hypercholesteremia   . Depressive disorder, not elsewhere classified   . Angina pectoris   . Sinoatrial node dysfunction   . Degeneration of lumbar or lumbosacral intervertebral disc   . Dyspnea   . CAD (coronary artery disease)      NYHA Class 1B, CABG 1985  . HTN (hypertension)   . OSA on CPAP     Past Surgical History  Procedure Laterality Date  . Coronary artery bypass graft    . Exploratory laparotomy    . Endarterectomy    . Rotator cuff repair    . Lumbar fusion    . Coronary angioplasty with stent placement      s/p bare metal stent implant SVG-diagonal 11/23/05, s/p BM stent implantation SVG diagonal 10/22/06 (feeds LAD), and 05/2010 with DES to left circumflex  . Pacemaker insertion      Current Outpatient Prescriptions  Medication Sig Dispense Refill  . aspirin EC 81 MG tablet Take 1 tablet (81 mg total) by mouth daily.    . ATORVASTATIN CALCIUM PO Take 20 mg by mouth every other day. Unsure of dose    . b complex vitamins tablet Take 1 tablet by mouth daily.      . Cholecalciferol (VITAMIN D3) 2000 UNITS TABS Take 2 tablets by mouth daily.     . citalopram (CELEXA) 20 MG tablet Take 20 mg by mouth daily.      .  clopidogrel (PLAVIX) 75 MG tablet Take 75 mg by mouth daily.      Marland Kitchen docusate sodium (COLACE) 100 MG capsule Take 100 mg by mouth daily.     . furosemide (LASIX) 40 MG tablet Take 2 tablets (80 mg total) by mouth daily. 60 tablet 3  . gabapentin (NEURONTIN) 300 MG capsule Take 300 mg by mouth 2 (two) times daily. 2 times daily w/ additional dose PRN, not to exceed TID    . hydroxypropyl methylcellulose (ISOPTO TEARS) 2.5 % ophthalmic solution 1 drop.    . isosorbide mononitrate (IMDUR) 30 MG 24 hr tablet Take 1 tablet (30 mg total) by mouth daily. 90 tablet 1  . metoprolol succinate (TOPROL XL) 25 MG 24 hr tablet Take 1 tablet (25 mg total) by mouth daily. 30 tablet 5  . Multiple Vitamin (MULTIVITAMIN) capsule Take 1 capsule by mouth daily.    . nitroGLYCERIN (NITROLINGUAL) 0.4 MG/SPRAY spray Place 1 spray under the tongue every 5 (five) minutes x 3 doses as needed for chest pain. 12 g 4  . Nutritional Supplements (MELATONIN PO) Take 1 tablet by mouth at bedtime.      Marland Kitchen UBIQUINOL PO Take 1 tablet by mouth daily. CoQ10     No  current facility-administered medications for this visit.    Allergies  Allergen Reactions  . Penicillins Swelling  . Simvastatin Other (See Comments)    Muscle weakness in legs ?    Review of Systems negative except from HPI and PMH  Physical Exam BP 109/70 mmHg  Pulse 67  Ht 5\' 10"  (1.778 m)  Wt 222 lb 3.2 oz (100.789 kg)  BMI 31.88 kg/m2 Well developed and nourished in no acute distress HENT normal Neck supple with 7-8 cm Clear Regular rate and rhythm, decreased S1 2/6 murmur  Abd-soft with active BS No Clubbing cyanosis L greater than the right 2+ edema Skin-warm and dry A & Oriented  Grossly normal sensory and motor function    Assessment and  Plan  Sinus node dysfunction  Atrial fibrillation   Coronary artery disease  Hypertension    Pacemaker-[employer]  Blood pressure is elevated at night. We will have him take his metoprolol  and amiodarone. I'm surprised that his systolic blood pressures run 190 evening when they're 1 10 in the morning.  Without symptoms of ischemia  He is modestly volume overloaded; however, he says this is for him and in fact somewhat better. My inclination would have been to push his diuretics a little bit. However, he feels like he is doing well.  100% atrial pacing with reasonable heart rate excursion.  There is no detected atrial fibrillation

## 2014-09-26 ENCOUNTER — Ambulatory Visit
Admission: RE | Admit: 2014-09-26 | Discharge: 2014-09-26 | Disposition: A | Discharge status: Home / Self Care | Payer: Medicare Other | Source: Ambulatory Visit | Attending: Geriatric Medicine | Admitting: Geriatric Medicine

## 2014-09-26 ENCOUNTER — Other Ambulatory Visit: Payer: Self-pay | Admitting: Geriatric Medicine

## 2014-09-26 DIAGNOSIS — C8590 Non-Hodgkin lymphoma, unspecified, unspecified site: Secondary | ICD-10-CM | POA: Diagnosis not present

## 2014-09-26 DIAGNOSIS — G4733 Obstructive sleep apnea (adult) (pediatric): Secondary | ICD-10-CM | POA: Diagnosis not present

## 2014-09-26 DIAGNOSIS — Z1389 Encounter for screening for other disorder: Secondary | ICD-10-CM | POA: Diagnosis not present

## 2014-09-26 DIAGNOSIS — Z79899 Other long term (current) drug therapy: Secondary | ICD-10-CM | POA: Diagnosis not present

## 2014-09-26 DIAGNOSIS — N281 Cyst of kidney, acquired: Secondary | ICD-10-CM

## 2014-09-26 DIAGNOSIS — N183 Chronic kidney disease, stage 3 (moderate): Secondary | ICD-10-CM | POA: Diagnosis not present

## 2014-09-26 DIAGNOSIS — E780 Pure hypercholesterolemia: Secondary | ICD-10-CM | POA: Diagnosis not present

## 2014-09-26 DIAGNOSIS — I2510 Atherosclerotic heart disease of native coronary artery without angina pectoris: Secondary | ICD-10-CM | POA: Diagnosis not present

## 2014-09-26 DIAGNOSIS — Z0000 Encounter for general adult medical examination without abnormal findings: Secondary | ICD-10-CM | POA: Diagnosis not present

## 2014-09-26 DIAGNOSIS — I129 Hypertensive chronic kidney disease with stage 1 through stage 4 chronic kidney disease, or unspecified chronic kidney disease: Secondary | ICD-10-CM | POA: Diagnosis not present

## 2014-09-26 DIAGNOSIS — G609 Hereditary and idiopathic neuropathy, unspecified: Secondary | ICD-10-CM | POA: Diagnosis not present

## 2014-10-02 ENCOUNTER — Ambulatory Visit: Payer: Medicare Other | Admitting: Interventional Cardiology

## 2014-10-15 ENCOUNTER — Other Ambulatory Visit (HOSPITAL_BASED_OUTPATIENT_CLINIC_OR_DEPARTMENT_OTHER): Payer: Medicare Other

## 2014-10-15 ENCOUNTER — Ambulatory Visit (HOSPITAL_BASED_OUTPATIENT_CLINIC_OR_DEPARTMENT_OTHER): Payer: Medicare Other | Admitting: Oncology

## 2014-10-15 ENCOUNTER — Telehealth: Payer: Self-pay | Admitting: Oncology

## 2014-10-15 VITALS — BP 158/76 | HR 65 | Temp 97.4°F | Resp 19 | Ht 70.0 in | Wt 222.2 lb

## 2014-10-15 DIAGNOSIS — D6481 Anemia due to antineoplastic chemotherapy: Secondary | ICD-10-CM

## 2014-10-15 DIAGNOSIS — C61 Malignant neoplasm of prostate: Secondary | ICD-10-CM

## 2014-10-15 DIAGNOSIS — N289 Disorder of kidney and ureter, unspecified: Secondary | ICD-10-CM

## 2014-10-15 DIAGNOSIS — Z8572 Personal history of non-Hodgkin lymphomas: Secondary | ICD-10-CM

## 2014-10-15 DIAGNOSIS — C8590 Non-Hodgkin lymphoma, unspecified, unspecified site: Secondary | ICD-10-CM

## 2014-10-15 LAB — BASIC METABOLIC PANEL (CC13)
ANION GAP: 12 meq/L — AB (ref 3–11)
BUN: 36.7 mg/dL — AB (ref 7.0–26.0)
CHLORIDE: 106 meq/L (ref 98–109)
CO2: 27 mEq/L (ref 22–29)
CREATININE: 1.6 mg/dL — AB (ref 0.7–1.3)
Calcium: 9.9 mg/dL (ref 8.4–10.4)
EGFR: 40 mL/min/{1.73_m2} — ABNORMAL LOW (ref >90)
Glucose: 110 mg/dl (ref 70–140)
POTASSIUM: 4.4 meq/L (ref 3.5–5.1)
Sodium: 144 mEq/L (ref 136–145)

## 2014-10-15 LAB — CBC WITH DIFFERENTIAL/PLATELET
BASO%: 0.6 % (ref 0.0–2.0)
BASOS ABS: 0 10*3/uL (ref 0.0–0.1)
EOS ABS: 0.2 10*3/uL (ref 0.0–0.5)
EOS%: 4.1 % (ref 0.0–7.0)
HEMATOCRIT: 35.1 % — AB (ref 38.4–49.9)
HGB: 11.3 g/dL — ABNORMAL LOW (ref 13.0–17.1)
LYMPH%: 32.9 % (ref 14.0–49.0)
MCH: 29.4 pg (ref 27.2–33.4)
MCHC: 32.2 g/dL (ref 32.0–36.0)
MCV: 91.4 fL (ref 79.3–98.0)
MONO#: 0.5 10*3/uL (ref 0.1–0.9)
MONO%: 9.1 % (ref 0.0–14.0)
NEUT#: 3.1 10*3/uL (ref 1.5–6.5)
NEUT%: 53.3 % (ref 39.0–75.0)
Platelets: 132 10*3/uL — ABNORMAL LOW (ref 140–400)
RBC: 3.84 10*6/uL — ABNORMAL LOW (ref 4.20–5.82)
RDW: 15.5 % — AB (ref 11.0–14.6)
WBC: 5.9 10*3/uL (ref 4.0–10.3)
lymph#: 1.9 10*3/uL (ref 0.9–3.3)

## 2014-10-15 NOTE — Progress Notes (Signed)
  [facility] OFFICE PROGRESS NOTE   Diagnosis: Non-Hodgkin's,, prostate cancer  INTERVAL HISTORY:   Edward Woodward returns as scheduled. He reports the blood pressure is sometimes low in the mornings. His activity level is limited by neuropathy in the legs and feet. He denies fever, anorexia, and pain. He will see Dr. Rosana Hoes next month.  Objective:  Vital signs in last 24 hours:  Blood pressure 172/62, pulse 65, temperature 97.4 F (36.3 C), temperature source Oral, resp. rate 19, height 5\' 10"  (1.778 m), weight 222 lb 3.2 oz (100.789 kg), SpO2 97 %.    HEENT: Neck without mass Lymphatics: No cervical, supraclavicular, axillary, or inguinal nodes Resp: Lungs clear bilaterally Cardio: Regular rate and rhythm GI: No hepatosplenomegaly Vascular: Trace edema at the left greater than right lower leg   Lab Results:  Lab Results  Component Value Date   WBC 5.9 10/15/2014   HGB 11.3* 10/15/2014   HCT 35.1* 10/15/2014   MCV 91.4 10/15/2014   PLT 132* 10/15/2014   NEUTROABS 3.1 10/15/2014   Creatinine 1.6, potassium 4.4, BUN 36.7, calcium 9.9  06/12/2014-PSA 14.98  Medications: I have reviewed the patient's current medications.  Assessment/Plan: 1. Non-Hodgkin's lymphoma (follicular grade 3 lymphoma) - status post 6 cycles of Cytoxan/prednisone/rituximab 07/01/2007 through 10/21/2007. He remains in clinical remission. 2. Prostate cancer - he has been treated with hormonal therapy, the PSA has been slowly rising, we will follow up on the PSA from today 3. History of a normocytic anemia secondary to non-Hodgkin's lymphoma, chemotherapy, and renal insufficiency - stable  4. History of Mild thrombocytopenia  5. History of bilateral hydronephrosis. Renal ultrasound 09/26/2014 consistent with medical renal disease. No hydronephrosis. 6. Coronary artery disease - status post coronary artery stent placement. 7. History of noncalcified lung nodules. 8. History of severe  neutropenia July 2009 - likely related to delayed toxicity from rituximab. 9. Macular degeneration. 10. Right middle lobe density on a CT of the chest 06/18/2010 measuring 2.5 x 1.6 cm with mildly increased metabolic activity (SUV max 2.5) on a PET scan 06/27/2010. The area of increased density was less confluent and appeared smaller on a restaging CT 09/08/2010. Right middle lobe "scarring "with no new or suspicious pulmonary nodule on a CT 08/04/2011. 11. Mild increased metabolic activity associated with several lymph nodes on the PET scan 06/27/2010 - likely related to lymphoma. No lymphadenopathy in the mediastinum or axillary areas on the CT 08/04/2011. 12. Right hip discomfort-he received a steroid injection by his primary physician without improvement. He saw Dr. Collier Salina and there was a question of a lytic process in the right pelvis. A bone scan on 09/21/2012 revealed no evidence of metastatic disease. 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 Renal insufficiency   Disposition:  There is no clinical evidence for progression of lymphoma. The PSA is slowly rising. We will follow-up on the PSA from today. He is scheduled to see Dr. Rosana Hoes next month. Edward Woodward will return for an office and lab visit in 3 months.  He takes his blood pressure medications at night. This could account for the a.m. hypotension. I recommended he discuss this with Dr. Felipa Eth.  [name], MD  10/15/2014  4:18 PM

## 2014-10-15 NOTE — Telephone Encounter (Signed)
Gave avs & calendar for June. °

## 2014-10-16 DIAGNOSIS — H35353 Cystoid macular degeneration, bilateral: Secondary | ICD-10-CM | POA: Diagnosis not present

## 2014-10-16 LAB — PSA: PSA: 14.63 ng/mL — ABNORMAL HIGH (ref <=4.00)

## 2014-10-18 ENCOUNTER — Telehealth: Payer: Self-pay | Admitting: Interventional Cardiology

## 2014-10-18 NOTE — Telephone Encounter (Signed)
Unable to lmom. Pt phone rings out.Called to give pt Dr.Varanasi (DOD) recommendation Change the way pt currently take his medication. He should take Imdur and Toprol separately. He should take Metoprolol in the morning, and see if symptoms and bp readings improve

## 2014-10-18 NOTE — Telephone Encounter (Signed)
New message    Pt c/o BP issue: STAT if pt c/o blurred vision, one-sided weakness or slurred speech  1. What are your last 5 BP readings? Today  100/55 @ 9 am /  10 am  93/57   2. Are you having any other symptoms (ex. Dizziness, headache, blurred vision, passed out)? No  , just very tired   3. What is your BP issue? Discuss metoprolol.

## 2014-10-18 NOTE — Telephone Encounter (Signed)
2nd attempt to reach pt .pt phone rings out

## 2014-10-18 NOTE — Telephone Encounter (Signed)
Pt reports his bp this morning @ 8am was 100/55, @ 1pm 142/74. His bpm runs in the 60's-70's. Later in the evening his bp tends to run high. Adv pt that Dr.Smith is out of the office, I will talk with another physician and call back with their recommendation Pt agreeable with plan

## 2014-10-18 NOTE — Telephone Encounter (Signed)
Returned pt call. Pt sts that his bp has been running low and high at times. He is fatigued and becomes dizzy when his bp is low. Pt denies chest pain, swelling, fever, chills, cough. He sts that he does have some sob when his bp is low. He is taking all medications as prescribed, including toprol xl 25mg  and imdur 30mg  at bedtime (12am)

## 2014-10-18 NOTE — Telephone Encounter (Signed)
F/U ° ° ° ° ° ° ° ° °Pt is returning call. Please call back. °

## 2014-10-19 NOTE — Telephone Encounter (Signed)
Spoke w/pt this AM.  Gave him instructions per Dr. Irish Lack to take Metoprolol in AM.  States BP this AM is still low.  103/60 at 7 AM; 91/52 at 7:30 AM.  Is not drinking fluids except coffee.  Advised him to increase fluid intake.  He will try taking medications as suggested and increase fluid intake. Will call back if BP continues to be low.

## 2014-10-29 ENCOUNTER — Other Ambulatory Visit: Payer: Self-pay | Admitting: Interventional Cardiology

## 2014-10-29 DIAGNOSIS — N3941 Urge incontinence: Secondary | ICD-10-CM | POA: Diagnosis not present

## 2014-10-29 DIAGNOSIS — C61 Malignant neoplasm of prostate: Secondary | ICD-10-CM | POA: Diagnosis not present

## 2014-10-29 DIAGNOSIS — R972 Elevated prostate specific antigen [PSA]: Secondary | ICD-10-CM | POA: Diagnosis not present

## 2014-11-02 ENCOUNTER — Encounter: Payer: Self-pay | Admitting: Interventional Cardiology

## 2014-11-02 ENCOUNTER — Ambulatory Visit (INDEPENDENT_AMBULATORY_CARE_PROVIDER_SITE_OTHER): Payer: Medicare Other | Admitting: Interventional Cardiology

## 2014-11-02 VITALS — BP 156/80 | HR 69 | Ht 70.0 in | Wt 222.4 lb

## 2014-11-02 DIAGNOSIS — I25118 Atherosclerotic heart disease of native coronary artery with other forms of angina pectoris: Secondary | ICD-10-CM

## 2014-11-02 DIAGNOSIS — I482 Chronic atrial fibrillation, unspecified: Secondary | ICD-10-CM

## 2014-11-02 DIAGNOSIS — E785 Hyperlipidemia, unspecified: Secondary | ICD-10-CM

## 2014-11-02 DIAGNOSIS — Z45018 Encounter for adjustment and management of other part of cardiac pacemaker: Secondary | ICD-10-CM

## 2014-11-02 DIAGNOSIS — I495 Sick sinus syndrome: Secondary | ICD-10-CM

## 2014-11-02 DIAGNOSIS — I10 Essential (primary) hypertension: Secondary | ICD-10-CM

## 2014-11-02 DIAGNOSIS — I739 Peripheral vascular disease, unspecified: Secondary | ICD-10-CM

## 2014-11-02 NOTE — Patient Instructions (Signed)
Your physician has recommended you make the following change in your medication:  1) STOP IMDUR  Take all other medications as prescribed  Call the office in 2-3 weeks with bp readings   Your physician has requested that you have a lower extremity arterial  duplex. During this test, ultrasound are used to evaluate arterial blood flow in the legs. Allow one hour for this exam. There are no restrictions or special instructions.  Your physician wants you to follow-up in:  6 months with Dr.Smith.You will receive a reminder letter in the mail two months in advance. If you don't receive a letter, please call our office to schedule the follow-up appointment.

## 2014-11-02 NOTE — Progress Notes (Signed)
Cardiology Office Note   Date:  11/02/2014   ID:  Edward Woodward, DOB 02/08/1927, MRN [id number]  PCP:  Mathews Argyle, MD  Cardiologist:   Sinclair Grooms, MD   No chief complaint on file.     History of Present Illness: Edward Woodward is a 81 y.o. male who presents for peripheral vascular disease and coronary disease. He also has hypertension. He has multiple concerns. The snake extended office visit because of multiple questions.  His concern by lower extremity (calf discomfort) that prevent him from exercising. States that he is walking less and less because of pain in the calves.  He is concerned that blood pressures are very low each morning. He feels terrible when his blood pressures are low. His multiple recordings of systolic pressures less than 100 mmHg. He takes Imdur at 12:30 or 1:00 each morning. Sure why he takes it that late other than the fact that he is still awake at that time. He denies angina. He has not fainted.  He is very concerned that when he calls I'm never in the office. He states that he doesn't know who to speak to..      Past Medical History  Diagnosis Date  . Cancer   . Prostate cancer   . Neuropathy   . Hypercholesteremia   . Depressive disorder, not elsewhere classified   . Angina pectoris   . Sinoatrial node dysfunction   . Degeneration of lumbar or lumbosacral intervertebral disc   . Dyspnea   . CAD (coronary artery disease)      NYHA Class 1B, CABG 1985  . HTN (hypertension)   . OSA on CPAP     Past Surgical History  Procedure Laterality Date  . Coronary artery bypass graft    . Exploratory laparotomy    . Endarterectomy    . Rotator cuff repair    . Lumbar fusion    . Coronary angioplasty with stent placement      s/p bare metal stent implant SVG-diagonal 11/23/05, s/p BM stent implantation SVG diagonal 10/22/06 (feeds LAD), and 05/2010 with DES to left circumflex  . Pacemaker insertion       Current Outpatient  Prescriptions  Medication Sig Dispense Refill  . aspirin EC 81 MG tablet Take 1 tablet (81 mg total) by mouth daily.    . ATORVASTATIN CALCIUM PO Take 20 mg by mouth every other day. Unsure of dose    . b complex vitamins tablet Take 1 tablet by mouth daily.      . Cholecalciferol (VITAMIN D3) 2000 UNITS TABS Take 2 tablets by mouth daily.     . citalopram (CELEXA) 20 MG tablet Take 20 mg by mouth daily.      . clopidogrel (PLAVIX) 75 MG tablet Take 75 mg by mouth daily.      Marland Kitchen docusate sodium (COLACE) 100 MG capsule Take 100 mg by mouth daily.     . furosemide (LASIX) 40 MG tablet TAKE TWO TABLETS BY MOUTH ONCE DAILY 60 tablet 0  . gabapentin (NEURONTIN) 300 MG capsule Take 300 mg by mouth 2 (two) times daily. 2 times daily w/ additional dose PRN, not to exceed TID    . hydroxypropyl methylcellulose (ISOPTO TEARS) 2.5 % ophthalmic solution 1 drop.    . metoprolol succinate (TOPROL XL) 25 MG 24 hr tablet Take 1 tablet (25 mg total) by mouth daily. 30 tablet 5  . Multiple Vitamin (MULTIVITAMIN) capsule Take 1 capsule by mouth daily.    Marland Kitchen  nitroGLYCERIN (NITROLINGUAL) 0.4 MG/SPRAY spray Place 1 spray under the tongue every 5 (five) minutes x 3 doses as needed for chest pain. 12 g 4  . Nutritional Supplements (MELATONIN PO) Take 1 tablet by mouth at bedtime.      Marland Kitchen UBIQUINOL PO Take 1 tablet by mouth daily. CoQ10     No current facility-administered medications for this visit.    Allergies:   Penicillins and Simvastatin    Social History:  The patient  reports that he has never smoked. He does not have any smokeless tobacco history on file. He reports that he does not drink alcohol or use illicit drugs.   Family History:  The patient's family history includes Heart disease in his father.    ROS:  Please see the history of present illness.   Otherwise, review of systems are positive for weakness, difficulty sleeping, dyspnea on exertion, cough, abdominal pain, back pain, muscle pain, snoring,  joint swelling, balance difficulty, and excessive fatigue. Also concerned about excessive/easy bruising..   All other systems are reviewed and negative.    PHYSICAL EXAM: VS:  BP 156/80 mmHg  Pulse 69  Ht 5\' 10"  (1.778 m)  Wt 222 lb 6.4 oz (100.88 kg)  BMI 31.91 kg/m2  SpO2 95% , BMI Body mass index is 31.91 kg/(m^2). GEN: Well nourished, well developed, in no acute distress HEENT: normal Neck: no JVD, carotid bruits, or masses Cardiac: RRR; 2/6 systolic murmur right upper sternal border. No rubs, or gallops,no edema  Respiratory:  clear to auscultation bilaterally, normal work of breathing GI: soft, nontender, nondistended, + BS MS: no deformity or atrophy Skin: warm and dry, no rash Neuro:  Strength and sensation are intact Psych: euthymic mood, full affect   EKG:  EKG is not ordered today. The ekg ordered today demonstrates    Recent Labs: 02/21/2014: ALT 19; Pro B Natriuretic peptide (BNP) 72.0; TSH 1.47 10/15/2014: BUN 36.7*; Creatinine 1.6*; Hemoglobin 11.3*; Platelets 132*; Potassium 4.4; Sodium 144    Lipid Panel    Component Value Date/Time   CHOL  06/18/2010 0824    123        ATP III CLASSIFICATION:  <200     mg/dL   Desirable  200-239  mg/dL   Borderline High  >=240    mg/dL   High          TRIG 93 06/18/2010 0824   HDL 40 06/18/2010 0824   CHOLHDL 3.1 06/18/2010 0824   VLDL 19 06/18/2010 0824   LDLCALC  06/18/2010 0824    64        Total Cholesterol/HDL:CHD Risk Coronary Heart Disease Risk Table                     Men   Women  1/2 Average Risk   3.4   3.3  Average Risk       5.0   4.4  2 X Average Risk   9.6   7.1  3 X Average Risk  23.4   11.0        Use the calculated Patient Ratio above and the CHD Risk Table to determine the patient's CHD Risk.        ATP III CLASSIFICATION (LDL):  <100     mg/dL   Optimal  100-129  mg/dL   Near or Above                    Optimal  130-159  mg/dL  Borderline  160-189  mg/dL   High  >190     mg/dL    Very High      Wt Readings from Last 3 Encounters:  11/02/14 222 lb 6.4 oz (100.88 kg)  10/15/14 222 lb 3.2 oz (100.789 kg)  08/28/14 222 lb 3.2 oz (100.789 kg)      Other studies Reviewed: Additional studies/ records that were reviewed today include: Reviewed 2014 Doppler and duplex studies.. Review of the above records demonstrates: Doppler studies revealed ABI of 0.5 bilaterally.   ASSESSMENT AND PLAN:  Chronic atrial fibrillation: Controlled rate  Exertional lower extremity pain felt to represent claudication: Need to discern whether leg pain as claudication or neuropathic.  Atherosclerosis of native coronary artery with other form of angina pectoris: Absent  Essential hypertension, benign  Cardiac pacemaker -[employer]  Claudication - see above     Current medicines are reviewed at length with the patient today.  The patient has concerns regarding medicines.  The following changes have been made:  I've advised the patient to discontinue isosorbide mononitrate. If he has recurring angina I will resume at but have him take it between 5 and 8 PM each evening rather than at 12 -12:30 AM. We will also perform bilateral lower extremity Doppler evaluation to determine if there is progression in obstructive disease. If significant I will refer her for PVD consult with Dr. Fletcher Anon or Gwenlyn Found  Labs/ tests ordered today include:  Orders Placed This Encounter  Procedures  . Lower Extremity Arterial Duplex Bilateral     Disposition:   FU with Linard Millers in 6 months   Signed, Sinclair Grooms, MD  11/02/2014 12:42 PM    [facility], [place], [place]  [address] Phone: [phone]; Fax: [phone]

## 2014-11-05 ENCOUNTER — Other Ambulatory Visit (HOSPITAL_COMMUNITY): Payer: Self-pay | Admitting: Cardiology

## 2014-11-05 DIAGNOSIS — I739 Peripheral vascular disease, unspecified: Secondary | ICD-10-CM

## 2014-11-07 ENCOUNTER — Ambulatory Visit (HOSPITAL_COMMUNITY): Payer: Medicare Other | Attending: Cardiology | Admitting: Cardiology

## 2014-11-07 DIAGNOSIS — R200 Anesthesia of skin: Secondary | ICD-10-CM

## 2014-11-07 DIAGNOSIS — I739 Peripheral vascular disease, unspecified: Secondary | ICD-10-CM | POA: Insufficient documentation

## 2014-11-07 NOTE — Progress Notes (Signed)
ABI performed  

## 2014-11-08 DIAGNOSIS — C61 Malignant neoplasm of prostate: Secondary | ICD-10-CM | POA: Diagnosis not present

## 2014-11-12 ENCOUNTER — Telehealth: Payer: Self-pay

## 2014-11-12 NOTE — Telephone Encounter (Signed)
-----   Message from Belva Crome, MD sent at 11/08/2014  8:19 AM EDT ----- Extremity Doppler studies are stable compared with prior. No further evaluation is necessary at this time

## 2014-11-12 NOTE — Telephone Encounter (Signed)
Pt aware of [place] do[facility] results. Extremity Doppler studies are stable compared with prior. No further evaluation is necessary at this time  Pt verbalized understanding.

## 2014-11-12 NOTE — Telephone Encounter (Signed)
Called to give pt Le [email or web address]

## 2014-11-12 NOTE — Telephone Encounter (Signed)
Follow up      Returning Edward Woodward's call to get test results

## 2014-12-15 ENCOUNTER — Other Ambulatory Visit: Payer: Self-pay | Admitting: Interventional Cardiology

## 2015-01-15 ENCOUNTER — Telehealth: Payer: Self-pay | Admitting: Oncology

## 2015-01-15 ENCOUNTER — Other Ambulatory Visit (HOSPITAL_BASED_OUTPATIENT_CLINIC_OR_DEPARTMENT_OTHER): Payer: Medicare Other

## 2015-01-15 ENCOUNTER — Ambulatory Visit (HOSPITAL_BASED_OUTPATIENT_CLINIC_OR_DEPARTMENT_OTHER): Payer: Medicare Other | Admitting: Oncology

## 2015-01-15 VITALS — BP 146/65 | HR 79 | Temp 97.0°F | Resp 18 | Ht 70.0 in | Wt 218.2 lb

## 2015-01-15 DIAGNOSIS — N189 Chronic kidney disease, unspecified: Secondary | ICD-10-CM

## 2015-01-15 DIAGNOSIS — D630 Anemia in neoplastic disease: Secondary | ICD-10-CM | POA: Diagnosis not present

## 2015-01-15 DIAGNOSIS — Z8572 Personal history of non-Hodgkin lymphomas: Secondary | ICD-10-CM

## 2015-01-15 DIAGNOSIS — C61 Malignant neoplasm of prostate: Secondary | ICD-10-CM | POA: Diagnosis not present

## 2015-01-15 DIAGNOSIS — N289 Disorder of kidney and ureter, unspecified: Secondary | ICD-10-CM | POA: Diagnosis not present

## 2015-01-15 DIAGNOSIS — C8590 Non-Hodgkin lymphoma, unspecified, unspecified site: Secondary | ICD-10-CM

## 2015-01-15 LAB — CBC WITH DIFFERENTIAL/PLATELET
BASO%: 0.4 % (ref 0.0–2.0)
Basophils Absolute: 0 10*3/uL (ref 0.0–0.1)
EOS ABS: 0.2 10*3/uL (ref 0.0–0.5)
EOS%: 2.8 % (ref 0.0–7.0)
HCT: 34.9 % — ABNORMAL LOW (ref 38.4–49.9)
HEMOGLOBIN: 11.6 g/dL — AB (ref 13.0–17.1)
LYMPH%: 30.8 % (ref 14.0–49.0)
MCH: 29.9 pg (ref 27.2–33.4)
MCHC: 33.4 g/dL (ref 32.0–36.0)
MCV: 89.5 fL (ref 79.3–98.0)
MONO#: 0.6 10*3/uL (ref 0.1–0.9)
MONO%: 10.4 % (ref 0.0–14.0)
NEUT%: 55.6 % (ref 39.0–75.0)
NEUTROS ABS: 3.4 10*3/uL (ref 1.5–6.5)
Platelets: 108 10*3/uL — ABNORMAL LOW (ref 140–400)
RBC: 3.9 10*6/uL — AB (ref 4.20–5.82)
RDW: 14.8 % — AB (ref 11.0–14.6)
WBC: 6.1 10*3/uL (ref 4.0–10.3)
lymph#: 1.9 10*3/uL (ref 0.9–3.3)

## 2015-01-15 LAB — BASIC METABOLIC PANEL (CC13)
Anion Gap: 7 mEq/L (ref 3–11)
BUN: 28.2 mg/dL — ABNORMAL HIGH (ref 7.0–26.0)
CALCIUM: 10.1 mg/dL (ref 8.4–10.4)
CHLORIDE: 105 meq/L (ref 98–109)
CO2: 30 mEq/L — ABNORMAL HIGH (ref 22–29)
Creatinine: 1.6 mg/dL — ABNORMAL HIGH (ref 0.7–1.3)
EGFR: 40 mL/min/{1.73_m2} — AB (ref >90)
Glucose: 97 mg/dl (ref 70–140)
Potassium: 4.4 mEq/L (ref 3.5–5.1)
SODIUM: 142 meq/L (ref 136–145)

## 2015-01-15 NOTE — Telephone Encounter (Signed)
Appointments made and avs printed for patient °

## 2015-01-15 NOTE — Progress Notes (Signed)
  [facility] OFFICE PROGRESS NOTE   Diagnosis: Non-Hodgkin's lymphoma, prostate cancer  INTERVAL HISTORY:   Edward Woodward returns as scheduled. He reports his blood pressure was low recently. His cardiac medical regimen was adjusted. Good appetite. No hot flashes. No hip pain. No fever or night sweats.  Objective:  Vital signs in last 24 hours:  Blood pressure 146/65, pulse 79, temperature 97 F (36.1 C), temperature source Oral, resp. rate 18, height 5\' 10"  (1.778 m), weight 218 lb 3.2 oz (98.975 kg), SpO2 97 %.    HEENT: Slight fullness at the left lower posterior scalene area without a discrete mass Lymphatics: No cervical, supra-clavicular, axillary, or inguinal nodes Resp: Distant breath sounds, no respiratory distress Cardio: Regular rate and rhythm GI: No hepatosplenomegaly Vascular: The left lower leg is larger than the right side, no edema   Lab Results:  Lab Results  Component Value Date   WBC 6.1 01/15/2015   HGB 11.6* 01/15/2015   HCT 34.9* 01/15/2015   MCV 89.5 01/15/2015   PLT 108* 01/15/2015   NEUTROABS 3.4 01/15/2015   PSA 14.63 on 10/15/2014  Medications: I have reviewed the patient's current medications.  Assessment/Plan: 1. Non-Hodgkin's lymphoma (follicular grade 3 lymphoma) - status post 6 cycles of Cytoxan/prednisone/rituximab 07/01/2007 through 10/21/2007. He remains in clinical remission. 2. Prostate cancer - he has been treated with hormonal therapy, the PSA was stable on 10/15/2014  3. History of a normocytic anemia secondary to non-Hodgkin's lymphoma, chemotherapy, and renal insufficiency - stable  4. History of Mild thrombocytopenia  5. History of bilateral hydronephrosis. Renal ultrasound 09/26/2014 consistent with medical renal disease. No hydronephrosis. 6. Coronary artery disease - status post coronary artery stent placement. 7. History of noncalcified lung nodules. 8. History of severe neutropenia July 2009 - likely  related to delayed toxicity from rituximab. 9. Macular degeneration. 10. Right middle lobe density on a CT of the chest 06/18/2010 measuring 2.5 x 1.6 cm with mildly increased metabolic activity (SUV max 2.5) on a PET scan 06/27/2010. The area of increased density was less confluent and appeared smaller on a restaging CT 09/08/2010. Right middle lobe "scarring "with no new or suspicious pulmonary nodule on a CT 08/04/2011. 11. Mild increased metabolic activity associated with several lymph nodes on the PET scan 06/27/2010 - likely related to lymphoma. No lymphadenopathy in the mediastinum or axillary areas on the CT 08/04/2011. 12. Right hip discomfort-he received a steroid injection by his primary physician without improvement. He saw Dr. Collier Salina and there was a question of a lytic process in the right pelvis. A bone scan on 09/21/2012 revealed no evidence of metastatic disease. 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 Renal insufficiency  Disposition:  Edward Woodward appears stable. There is no clinical evidence for progression of non-Hodgkin's lymphoma, or prostate cancer. He continues follow-up with Dr. Rosana Hoes for treatment of prostate cancer. He will return for an office and lab visit in 4 months.  [name], MD  01/15/2015  12:33 PM

## 2015-01-16 ENCOUNTER — Telehealth: Payer: Self-pay | Admitting: Interventional Cardiology

## 2015-01-16 LAB — PSA: PSA: 13.94 ng/mL — AB (ref <=4.00)

## 2015-01-16 NOTE — Telephone Encounter (Signed)
New message      Blood pressure is irratic. This afternoon reading 99/53 Bedtime reading last night 187/75.  Please advise

## 2015-01-17 NOTE — Telephone Encounter (Signed)
Returned pt call. Pt sts that he is currently taking all medications as prescribed. Pt is currently asymptomatic. He reports that his bp has been running low at times. Pt sts that his bp on 01/16/15 @ 1:30pm was 99/53. He usually takes his meds around 9am. He is unable to provide addiitonal readings, he does not keep a bp log. He feels "washed out" when his bp is lower. Adv pt to continue to monitor his bp daily over th next wk and call the office with reading. When pt was seen in April 2016 by Dr.Smith he was suppose to call back in 2-3 wks with readings. Adv pt I will fwd an update to Dr.Smith and call back if he has additional recommendations. Pt agreeable with plan and verbalized understanding.

## 2015-01-21 ENCOUNTER — Other Ambulatory Visit: Payer: Self-pay

## 2015-02-13 ENCOUNTER — Other Ambulatory Visit: Payer: Self-pay | Admitting: Physician Assistant

## 2015-03-12 ENCOUNTER — Telehealth: Payer: Self-pay | Admitting: Interventional Cardiology

## 2015-03-12 NOTE — Telephone Encounter (Signed)
Agree with advice. Decrease salt in diet. Continue to monitor.

## 2015-03-12 NOTE — Telephone Encounter (Signed)
Called patient about his BP readings. Patient stated his BP has been higher than normal. Asked patient about when he takes his medications. Patient stated he takes them at different times and it all depends on when he gets up. Advised patient to try to take his medications at the same time every day. Will send message to Dr. Tamala Julian for further advisement.

## 2015-03-12 NOTE — Telephone Encounter (Signed)
New message     Pt c/o BP issue: STAT if pt c/o blurred vision, one-sided weakness or slurred speech  1. What are your last 5 BP reading?  Yesterday @ 12am 187/91; 11pm 175/96; 3:30pm 126/81; 3pm 156/82; 11am 137/76  2. Are you having any other symptoms (ex. Dizziness, headache, blurred vision, passed out)? No  3. What is your BP issue? Running higher than normal  Please call to discuss

## 2015-03-13 NOTE — Telephone Encounter (Signed)
Left message to call back  

## 2015-03-13 NOTE — Telephone Encounter (Signed)
Patient called back for Dr. Thompson Caul advisement . Patient agreed to decrease salt in his diet, take medications at the same time daily, and will continue to monitor BPs.

## 2015-03-22 ENCOUNTER — Encounter: Payer: Self-pay | Admitting: *Deleted

## 2015-03-28 DIAGNOSIS — I2699 Other pulmonary embolism without acute cor pulmonale: Secondary | ICD-10-CM

## 2015-03-28 HISTORY — DX: Other pulmonary embolism without acute cor pulmonale: I26.99

## 2015-03-31 ENCOUNTER — Emergency Department (HOSPITAL_COMMUNITY): Payer: Medicare Other

## 2015-03-31 ENCOUNTER — Inpatient Hospital Stay (HOSPITAL_COMMUNITY)
Admission: EM | Admit: 2015-03-31 | Discharge: 2015-04-02 | DRG: 175 | Disposition: A | Discharge status: Home Health | Payer: Medicare Other | Attending: Internal Medicine | Admitting: Internal Medicine

## 2015-03-31 ENCOUNTER — Encounter (HOSPITAL_COMMUNITY): Payer: Self-pay | Admitting: *Deleted

## 2015-03-31 DIAGNOSIS — I129 Hypertensive chronic kidney disease with stage 1 through stage 4 chronic kidney disease, or unspecified chronic kidney disease: Secondary | ICD-10-CM | POA: Diagnosis not present

## 2015-03-31 DIAGNOSIS — N183 Chronic kidney disease, stage 3 unspecified: Secondary | ICD-10-CM | POA: Diagnosis present

## 2015-03-31 DIAGNOSIS — J9601 Acute respiratory failure with hypoxia: Secondary | ICD-10-CM | POA: Diagnosis not present

## 2015-03-31 DIAGNOSIS — Z45018 Encounter for adjustment and management of other part of cardiac pacemaker: Secondary | ICD-10-CM

## 2015-03-31 DIAGNOSIS — Z79818 Long term (current) use of other agents affecting estrogen receptors and estrogen levels: Secondary | ICD-10-CM

## 2015-03-31 DIAGNOSIS — G4733 Obstructive sleep apnea (adult) (pediatric): Secondary | ICD-10-CM | POA: Diagnosis present

## 2015-03-31 DIAGNOSIS — I739 Peripheral vascular disease, unspecified: Secondary | ICD-10-CM | POA: Diagnosis present

## 2015-03-31 DIAGNOSIS — E780 Pure hypercholesterolemia: Secondary | ICD-10-CM | POA: Diagnosis present

## 2015-03-31 DIAGNOSIS — Z951 Presence of aortocoronary bypass graft: Secondary | ICD-10-CM

## 2015-03-31 DIAGNOSIS — Z86718 Personal history of other venous thrombosis and embolism: Secondary | ICD-10-CM

## 2015-03-31 DIAGNOSIS — Z9989 Dependence on other enabling machines and devices: Secondary | ICD-10-CM

## 2015-03-31 DIAGNOSIS — I2510 Atherosclerotic heart disease of native coronary artery without angina pectoris: Secondary | ICD-10-CM | POA: Diagnosis present

## 2015-03-31 DIAGNOSIS — I495 Sick sinus syndrome: Secondary | ICD-10-CM | POA: Diagnosis present

## 2015-03-31 DIAGNOSIS — Z8572 Personal history of non-Hodgkin lymphomas: Secondary | ICD-10-CM

## 2015-03-31 DIAGNOSIS — R52 Pain, unspecified: Secondary | ICD-10-CM

## 2015-03-31 DIAGNOSIS — I10 Essential (primary) hypertension: Secondary | ICD-10-CM | POA: Diagnosis present

## 2015-03-31 DIAGNOSIS — R0602 Shortness of breath: Secondary | ICD-10-CM | POA: Diagnosis not present

## 2015-03-31 DIAGNOSIS — Z8546 Personal history of malignant neoplasm of prostate: Secondary | ICD-10-CM

## 2015-03-31 DIAGNOSIS — I2699 Other pulmonary embolism without acute cor pulmonale: Principal | ICD-10-CM | POA: Diagnosis present

## 2015-03-31 DIAGNOSIS — D649 Anemia, unspecified: Secondary | ICD-10-CM | POA: Diagnosis not present

## 2015-03-31 DIAGNOSIS — Z950 Presence of cardiac pacemaker: Secondary | ICD-10-CM

## 2015-03-31 DIAGNOSIS — Z66 Do not resuscitate: Secondary | ICD-10-CM | POA: Diagnosis present

## 2015-03-31 DIAGNOSIS — Z8249 Family history of ischemic heart disease and other diseases of the circulatory system: Secondary | ICD-10-CM

## 2015-03-31 DIAGNOSIS — I480 Paroxysmal atrial fibrillation: Secondary | ICD-10-CM | POA: Diagnosis present

## 2015-03-31 DIAGNOSIS — I4891 Unspecified atrial fibrillation: Secondary | ICD-10-CM | POA: Diagnosis not present

## 2015-03-31 HISTORY — DX: Other complications of anesthesia, initial encounter: T88.59XA

## 2015-03-31 HISTORY — DX: Adverse effect of unspecified anesthetic, initial encounter: T41.45XA

## 2015-03-31 HISTORY — DX: Presence of cardiac pacemaker: Z95.0

## 2015-03-31 HISTORY — DX: Myoneural disorder, unspecified: G70.9

## 2015-03-31 HISTORY — DX: Systemic involvement of connective tissue, unspecified: M35.9

## 2015-03-31 HISTORY — DX: Other pulmonary embolism without acute cor pulmonale: I26.99

## 2015-03-31 HISTORY — DX: Gastro-esophageal reflux disease without esophagitis: K21.9

## 2015-03-31 LAB — CBC
HCT: 36.3 % — ABNORMAL LOW (ref 39.0–52.0)
HEMOGLOBIN: 12.1 g/dL — AB (ref 13.0–17.0)
MCH: 30.6 pg (ref 26.0–34.0)
MCHC: 33.3 g/dL (ref 30.0–36.0)
MCV: 91.7 fL (ref 78.0–100.0)
PLATELETS: 123 10*3/uL — AB (ref 150–400)
RBC: 3.96 MIL/uL — AB (ref 4.22–5.81)
RDW: 13.9 % (ref 11.5–15.5)
WBC: 8.4 10*3/uL (ref 4.0–10.5)

## 2015-03-31 LAB — D-DIMER, QUANTITATIVE: D-Dimer, Quant: 5.15 ug/mL-FEU — ABNORMAL HIGH (ref 0.00–0.48)

## 2015-03-31 LAB — BASIC METABOLIC PANEL
ANION GAP: 10 (ref 5–15)
BUN: 29 mg/dL — ABNORMAL HIGH (ref 6–20)
CALCIUM: 10.1 mg/dL (ref 8.9–10.3)
CO2: 27 mmol/L (ref 22–32)
Chloride: 103 mmol/L (ref 101–111)
Creatinine, Ser: 1.78 mg/dL — ABNORMAL HIGH (ref 0.61–1.24)
GFR, EST AFRICAN AMERICAN: 38 mL/min — AB (ref >60)
GFR, EST NON AFRICAN AMERICAN: 33 mL/min — AB (ref >60)
GLUCOSE: 144 mg/dL — AB (ref 65–99)
POTASSIUM: 3.8 mmol/L (ref 3.5–5.1)
Sodium: 140 mmol/L (ref 135–145)

## 2015-03-31 LAB — TROPONIN I: Troponin I: <0.03 ng/mL (ref <0.031)

## 2015-03-31 LAB — BRAIN NATRIURETIC PEPTIDE: B NATRIURETIC PEPTIDE 5: 193.2 pg/mL — AB (ref 0.0–100.0)

## 2015-03-31 NOTE — ED Notes (Signed)
Pt has been experiencing dyspnea on exertion for several days.  Denies chest pain or cough, but c/o periumbilical discomfort and weakness.

## 2015-03-31 NOTE — ED Provider Notes (Signed)
CSN: [id number]     Arrival date & time 03/31/15  1824 History   First MD Initiated Contact with Patient 03/31/15 2012     Chief Complaint  Patient presents with  . Shortness of Breath     (Consider location/radiation/quality/duration/timing/severity/associated sxs/prior Treatment) HPI Comments: Patient presents to the ER for evaluation of shortness of breath. Patient reports that he has had progressively worsening shortness of breath for at least one week, but in the last 1 or 2 days symptoms have significantly worsened. Patient not expressing any chest pain associated with the symptoms. He has not had any cough or fever. Patient reports generalized weakness. Shortness of breath is all the time, including at rest, but significantly worsens with exertion.  Patient is a 81 y.o. male presenting with shortness of breath.  Shortness of Breath   Past Medical History  Diagnosis Date  . Cancer   . Prostate cancer   . Neuropathy   . Hypercholesteremia   . Depressive disorder, not elsewhere classified   . Angina pectoris   . Sinoatrial node dysfunction   . Degeneration of lumbar or lumbosacral intervertebral disc   . Dyspnea   . CAD (coronary artery disease)      NYHA Class 1B, CABG 1985  . HTN (hypertension)   . OSA on CPAP   . Collagen vascular disease   . Complication of anesthesia     " I get confused afterwords  "  . PE (pulmonary embolism) 03/2015  . Neuromuscular disorder     neuropathy  . GERD (gastroesophageal reflux disease)   . Presence of permanent cardiac pacemaker    Past Surgical History  Procedure Laterality Date  . Coronary artery bypass graft    . Exploratory laparotomy    . Endarterectomy    . Rotator cuff repair    . Lumbar fusion    . Coronary angioplasty with stent placement      s/p bare metal stent implant SVG-diagonal 11/23/05, s/p BM stent implantation SVG diagonal 10/22/06 (feeds LAD), and 05/2010 with DES to left circumflex  . Pacemaker insertion      Family History  Problem Relation Age of Onset  . Heart disease Father    Social History  Substance Use Topics  . Smoking status: Never Smoker   . Smokeless tobacco: Never Used  . Alcohol Use: No    Review of Systems  Constitutional: Positive for fatigue.  Respiratory: Positive for shortness of breath.   All other systems reviewed and are negative.     Allergies  Penicillins and Simvastatin  Home Medications   Prior to Admission medications   Medication Sig Start Date End Date Taking? Authorizing Provider  aspirin EC 81 MG tablet Take 1 tablet (81 mg total) by mouth daily. Patient taking differently: Take 81 mg by mouth at bedtime.  02/21/14  Yes [name], MD  atorvastatin (LIPITOR) 20 MG tablet Take 20 mg by mouth every other day.   Yes Historical Provider, MD  b complex vitamins tablet Take 1 tablet by mouth daily.     Yes Historical Provider, MD  Cholecalciferol (VITAMIN D3) 2000 UNITS TABS Take 2 tablets by mouth daily.    Yes Historical Provider, MD  citalopram (CELEXA) 20 MG tablet Take 20 mg by mouth daily.     Yes Historical Provider, MD  clopidogrel (PLAVIX) 75 MG tablet Take 75 mg by mouth daily.     Yes Historical Provider, MD  furosemide (LASIX) 40 MG tablet TAKE TWO TABLETS  BY MOUTH ONCE DAILY 12/17/14  Yes Belva Crome, MD  gabapentin (NEURONTIN) 300 MG capsule Take 300 mg by mouth 2 (two) times daily. 2 times daily w/ additional dose PRN, not to exceed TID   Yes Historical Provider, MD  Leuprolide Acetate, 6 Month, (LUPRON) 45 MG injection Inject 45 mg into the muscle daily. 11/08/14 10/28/16 Yes Historical Provider, MD  Melatonin 3 MG TABS Take 3 mg by mouth at bedtime.   Yes Historical Provider, MD  metoprolol succinate (TOPROL-XL) 25 MG 24 hr tablet TAKE ONE TABLET BY MOUTH ONCE DAILY 02/13/15  Yes Dayna N Dunn, PA-C  Multiple Vitamin (MULTIVITAMIN) capsule Take 1 capsule by mouth daily.   Yes Historical Provider, MD  nitroGLYCERIN (NITROLINGUAL) 0.4  MG/SPRAY spray Place 1 spray under the tongue every 5 (five) minutes x 3 doses as needed for chest pain. 04/04/14  Yes Belva Crome, MD  Nutritional Supplements (MELATONIN PO) Take 1 tablet by mouth at bedtime.     Yes Historical Provider, MD  OMEGA-3 KRILL OIL PO Take 2 capsules by mouth daily.   Yes Historical Provider, MD  Polyethyl Glycol-Propyl Glycol (SYSTANE) 0.4-0.3 % SOLN Apply 1 drop to eye at bedtime.   Yes Historical Provider, MD  UBIQUINOL PO Take 1 tablet by mouth daily. CoQ10   Yes Historical Provider, MD  zolpidem (AMBIEN) 5 MG tablet Take 5 mg by mouth at bedtime as needed for sleep.  02/28/15  Yes Historical Provider, MD   BP 142/67 mmHg  Pulse 65  Temp(Src) 99.1 F (37.3 C) (Oral)  Resp 26  Ht 5\' 10"  (1.778 m)  Wt 210 lb 4.8 oz (95.391 kg)  BMI 30.17 kg/m2  SpO2 95% Physical Exam  Constitutional: He is oriented to person, place, and time. He appears well-developed and well-nourished. No distress.  HENT:  Head: Normocephalic and atraumatic.  Right Ear: Hearing normal.  Left Ear: Hearing normal.  Nose: Nose normal.  Mouth/Throat: Oropharynx is clear and moist and mucous membranes are normal.  Eyes: Conjunctivae and EOM are normal. Pupils are equal, round, and reactive to light.  Neck: Normal range of motion. Neck supple.  Cardiovascular: Regular rhythm, S1 normal and S2 normal.  Exam reveals no gallop and no friction rub.   No murmur heard. Pulmonary/Chest: Effort normal and breath sounds normal. No respiratory distress. He exhibits no tenderness.  Abdominal: Soft. Normal appearance and bowel sounds are normal. There is no hepatosplenomegaly. There is no tenderness. There is no rebound, no guarding, no tenderness at McBurney's point and negative Murphy's sign. No hernia.  Musculoskeletal: Normal range of motion.  Neurological: He is alert and oriented to person, place, and time. He has normal strength. No cranial nerve deficit or sensory deficit. Coordination normal. GCS  eye subscore is 4. GCS verbal subscore is 5. GCS motor subscore is 6.  Skin: Skin is warm, dry and intact. No rash noted. No cyanosis.  Psychiatric: He has a normal mood and affect. His speech is normal and behavior is normal. Thought content normal.  Nursing note and vitals reviewed.   ED Course  Procedures (including critical care time) Labs Review Labs Reviewed  BASIC METABOLIC PANEL - Abnormal; Notable for the following:    Glucose, Bld 144 (*)    BUN 29 (*)    Creatinine, Ser 1.78 (*)    GFR calc non Af Amer 33 (*)    GFR calc Af Amer 38 (*)    All other components within normal limits  CBC -  Abnormal; Notable for the following:    RBC 3.96 (*)    Hemoglobin 12.1 (*)    HCT 36.3 (*)    Platelets 123 (*)    All other components within normal limits  BRAIN NATRIURETIC PEPTIDE - Abnormal; Notable for the following:    B Natriuretic Peptide 193.2 (*)    All other components within normal limits  D-DIMER, QUANTITATIVE (NOT AT [facility]) - Abnormal; Notable for the following:    D-Dimer, Quant 5.15 (*)    All other components within normal limits  HEPARIN LEVEL (UNFRACTIONATED) - Abnormal; Notable for the following:    Heparin Unfractionated 0.16 (*)    All other components within normal limits  TROPONIN I - Abnormal; Notable for the following:    Troponin I 0.04 (*)    All other components within normal limits  TROPONIN I - Abnormal; Notable for the following:    Troponin I 0.06 (*)    All other components within normal limits  TROPONIN I - Abnormal; Notable for the following:    Troponin I 0.06 (*)    All other components within normal limits  COMPREHENSIVE METABOLIC PANEL - Abnormal; Notable for the following:    Glucose, Bld 118 (*)    BUN 25 (*)    Creatinine, Ser 1.54 (*)    Total Protein 5.8 (*)    Albumin 3.3 (*)    ALT 11 (*)    GFR calc non Af Amer 39 (*)    GFR calc Af Amer 46 (*)    All other components within normal limits  CBC WITH DIFFERENTIAL/PLATELET -  Abnormal; Notable for the following:    RBC 3.58 (*)    Hemoglobin 10.9 (*)    HCT 32.9 (*)    Platelets 102 (*)    All other components within normal limits  CBC - Abnormal; Notable for the following:    RBC 3.52 (*)    Hemoglobin 10.4 (*)    HCT 32.3 (*)    Platelets 103 (*)    All other components within normal limits  BASIC METABOLIC PANEL - Abnormal; Notable for the following:    Glucose, Bld 103 (*)    Creatinine, Ser 1.47 (*)    GFR calc non Af Amer 42 (*)    GFR calc Af Amer 48 (*)    All other components within normal limits  MRSA PCR SCREENING  TROPONIN I    Imaging Review Dg Chest 2 View  03/31/2015   CLINICAL DATA:  81 year old male with shortness of Breath 1 week. Personal history of collapsed lung. Initial encounter.  EXAM: CHEST  2 VIEW  COMPARISON:  07/24/2013.  FINDINGS: Stable lung volumes. Stable left chest cardiac pacemaker and sequelae of median sternotomy. Stable cardiac size and mediastinal contours. Chronic calcified atherosclerosis of the aorta. No pneumothorax, pulmonary edema, pleural effusion or acute pulmonary opacity. Stable small calcified granulomas in the right lung. No acute osseous abnormality identified.  IMPRESSION: No acute cardiopulmonary abnormality.   Electronically Signed   By: Genevie Ann M.D.   On: 03/31/2015 19:52   Ct Angio Chest Pe W/cm &/or Wo Cm  04/01/2015   CLINICAL DATA:  81 year old male with shortness of breath. History of lymphoma and prostate cancer.  EXAM: CT ANGIOGRAPHY CHEST WITH CONTRAST  TECHNIQUE: Multidetector CT imaging of the chest was performed using the standard protocol during bolus administration of intravenous contrast. Multiplanar CT image reconstructions and MIPs were obtained to evaluate the vascular anatomy.  CONTRAST:  70mL OMNIPAQUE IOHEXOL 350 MG/ML SOLN  COMPARISON:  Chest radiograph dated 03/31/2015  FINDINGS: Mild emphysema. No focal consolidation or pneumothorax. Small scattered right lung calcified nodules  compatible with old granuloma. Small right pleural effusion. The central airways are patent.  There is atherosclerotic calcification of the thoracic aorta. There is a large pulmonary embolus involving the central and lobar right pulmonary artery and extending into the segmental branches of the right lower and right upper lobes. Small left upper lobe pulmonary artery embolus also noted. There is extensive coronary vascular calcification. Top-normal cardiac size. No pericardial effusion. No definite evidence of right cardiac strain. Clinical correlation is recommended. There is a left pectoral pacemaker device. No lymphadenopathy. The visualized thyroid gland appears unremarkable.  There is no axillary adenopathy. Median sternotomy wires noted. There is degenerative changes of the spine with lower thoracic disc desiccation and vacuum phenomenon. No acute fracture. Gallstones noted. There is distention of the gallbladder. Multiple bilateral renal hypodense lesions, incompletely characterized likely corresponding to the cyst seen on the prior ultrasound. Follow-up with ultrasound as previously recommended.  Review of the MIP images confirms the above findings.  IMPRESSION: Large right central pulmonary artery embolus with smaller left upper lobe pulmonary artery embolus.  Cholelithiasis.  Incompletely characterized renal hypodense lesions. Follow-up with ultrasound recommended.  Critical Value/emergent results were called by telephone at the time of interpretation on 04/01/2015 at 2:34 am to the ER physician , who verbally acknowledged these results.   Electronically Signed   By: Anner Crete M.D.   On: 04/01/2015 02:44   I have personally reviewed and evaluated these images and lab results as part of my medical decision-making.   EKG Interpretation   Date/Time:  Sunday March 31 2015 20:28:46 EDT Ventricular Rate:  107 PR Interval:  27 QRS Duration: 128 QT Interval:  420 QTC Calculation: 560 R Axis:    -66 Text Interpretation:  VENTRICULAR PACED RHYTHM Confirmed by Betsey Holiday  MD,  CHRISTOPHER [phone]) on 03/31/2015 8:38:45 PM      MDM   Final diagnoses:  Pulmonary embolus    Patient presented to the ER for evaluation of shortness of breath. Patient reports progressive worsening shortness of breath with significant worsening over the last 1-2 days. Patient was initially short of breath with exertion, now short of breath. He did not have any significant hypoxia. Cardiac workup did not suggest congestive heart failure. Based on previous history of DVT with some swelling of left leg, PE was strongly considered. CT angiography performed. Case was signed out at that time to oncoming ER physician.    Orpah Greek, MD 04/02/15 [phone]

## 2015-03-31 NOTE — ED Notes (Signed)
Patient is obviously weak getting out of wheelchair, having two person assist.  Family noted that patient has been weak over the last few days and has not been steady on his feet.  Patient states that the shortness of breath has been increasing in the last week.  Slight edema noted in left ankle.

## 2015-04-01 ENCOUNTER — Emergency Department (HOSPITAL_COMMUNITY): Payer: Medicare Other

## 2015-04-01 ENCOUNTER — Encounter (HOSPITAL_COMMUNITY): Payer: Self-pay | Admitting: Radiology

## 2015-04-01 DIAGNOSIS — J9601 Acute respiratory failure with hypoxia: Secondary | ICD-10-CM | POA: Diagnosis present

## 2015-04-01 DIAGNOSIS — D649 Anemia, unspecified: Secondary | ICD-10-CM | POA: Diagnosis present

## 2015-04-01 DIAGNOSIS — I495 Sick sinus syndrome: Secondary | ICD-10-CM | POA: Diagnosis not present

## 2015-04-01 DIAGNOSIS — I2699 Other pulmonary embolism without acute cor pulmonale: Secondary | ICD-10-CM | POA: Diagnosis present

## 2015-04-01 DIAGNOSIS — N183 Chronic kidney disease, stage 3 unspecified: Secondary | ICD-10-CM | POA: Diagnosis present

## 2015-04-01 DIAGNOSIS — I4891 Unspecified atrial fibrillation: Secondary | ICD-10-CM | POA: Diagnosis present

## 2015-04-01 DIAGNOSIS — I10 Essential (primary) hypertension: Secondary | ICD-10-CM | POA: Diagnosis not present

## 2015-04-01 DIAGNOSIS — I739 Peripheral vascular disease, unspecified: Secondary | ICD-10-CM | POA: Diagnosis present

## 2015-04-01 DIAGNOSIS — I2510 Atherosclerotic heart disease of native coronary artery without angina pectoris: Secondary | ICD-10-CM | POA: Diagnosis present

## 2015-04-01 DIAGNOSIS — Z950 Presence of cardiac pacemaker: Secondary | ICD-10-CM | POA: Diagnosis not present

## 2015-04-01 DIAGNOSIS — Z86718 Personal history of other venous thrombosis and embolism: Secondary | ICD-10-CM | POA: Diagnosis not present

## 2015-04-01 DIAGNOSIS — I129 Hypertensive chronic kidney disease with stage 1 through stage 4 chronic kidney disease, or unspecified chronic kidney disease: Secondary | ICD-10-CM | POA: Diagnosis present

## 2015-04-01 DIAGNOSIS — Z8546 Personal history of malignant neoplasm of prostate: Secondary | ICD-10-CM | POA: Diagnosis not present

## 2015-04-01 DIAGNOSIS — G4733 Obstructive sleep apnea (adult) (pediatric): Secondary | ICD-10-CM | POA: Diagnosis present

## 2015-04-01 DIAGNOSIS — I482 Chronic atrial fibrillation: Secondary | ICD-10-CM

## 2015-04-01 DIAGNOSIS — E780 Pure hypercholesterolemia: Secondary | ICD-10-CM | POA: Diagnosis present

## 2015-04-01 DIAGNOSIS — Z951 Presence of aortocoronary bypass graft: Secondary | ICD-10-CM | POA: Diagnosis not present

## 2015-04-01 DIAGNOSIS — Z66 Do not resuscitate: Secondary | ICD-10-CM | POA: Diagnosis present

## 2015-04-01 DIAGNOSIS — Z8572 Personal history of non-Hodgkin lymphomas: Secondary | ICD-10-CM | POA: Diagnosis not present

## 2015-04-01 DIAGNOSIS — R52 Pain, unspecified: Secondary | ICD-10-CM | POA: Diagnosis not present

## 2015-04-01 DIAGNOSIS — Z8249 Family history of ischemic heart disease and other diseases of the circulatory system: Secondary | ICD-10-CM | POA: Diagnosis not present

## 2015-04-01 DIAGNOSIS — Z79818 Long term (current) use of other agents affecting estrogen receptors and estrogen levels: Secondary | ICD-10-CM | POA: Diagnosis not present

## 2015-04-01 LAB — TROPONIN I
TROPONIN I: 0.04 ng/mL — AB (ref <0.031)
TROPONIN I: 0.06 ng/mL — AB (ref <0.031)
TROPONIN I: 0.06 ng/mL — AB (ref <0.031)

## 2015-04-01 LAB — CBC WITH DIFFERENTIAL/PLATELET
BASOS ABS: 0 10*3/uL (ref 0.0–0.1)
Basophils Relative: 0 % (ref 0–1)
EOS PCT: 2 % (ref 0–5)
Eosinophils Absolute: 0.2 10*3/uL (ref 0.0–0.7)
HEMATOCRIT: 32.9 % — AB (ref 39.0–52.0)
HEMOGLOBIN: 10.9 g/dL — AB (ref 13.0–17.0)
LYMPHS ABS: 1.7 10*3/uL (ref 0.7–4.0)
LYMPHS PCT: 25 % (ref 12–46)
MCH: 30.4 pg (ref 26.0–34.0)
MCHC: 33.1 g/dL (ref 30.0–36.0)
MCV: 91.9 fL (ref 78.0–100.0)
Monocytes Absolute: 0.6 10*3/uL (ref 0.1–1.0)
Monocytes Relative: 8 % (ref 3–12)
NEUTROS ABS: 4.3 10*3/uL (ref 1.7–7.7)
Neutrophils Relative %: 64 % (ref 43–77)
PLATELETS: 102 10*3/uL — AB (ref 150–400)
RBC: 3.58 MIL/uL — AB (ref 4.22–5.81)
RDW: 14 % (ref 11.5–15.5)
WBC: 6.7 10*3/uL (ref 4.0–10.5)

## 2015-04-01 LAB — COMPREHENSIVE METABOLIC PANEL
ALBUMIN: 3.3 g/dL — AB (ref 3.5–5.0)
ALT: 11 U/L — ABNORMAL LOW (ref 17–63)
ANION GAP: 9 (ref 5–15)
AST: 23 U/L (ref 15–41)
Alkaline Phosphatase: 52 U/L (ref 38–126)
BUN: 25 mg/dL — AB (ref 6–20)
CHLORIDE: 106 mmol/L (ref 101–111)
CO2: 27 mmol/L (ref 22–32)
Calcium: 9.5 mg/dL (ref 8.9–10.3)
Creatinine, Ser: 1.54 mg/dL — ABNORMAL HIGH (ref 0.61–1.24)
GFR calc Af Amer: 46 mL/min — ABNORMAL LOW (ref >60)
GFR, EST NON AFRICAN AMERICAN: 39 mL/min — AB (ref >60)
Glucose, Bld: 118 mg/dL — ABNORMAL HIGH (ref 65–99)
POTASSIUM: 4.2 mmol/L (ref 3.5–5.1)
Sodium: 142 mmol/L (ref 135–145)
Total Bilirubin: 0.5 mg/dL (ref 0.3–1.2)
Total Protein: 5.8 g/dL — ABNORMAL LOW (ref 6.5–8.1)

## 2015-04-01 LAB — MRSA PCR SCREENING: MRSA by PCR: NEGATIVE

## 2015-04-01 LAB — HEPARIN LEVEL (UNFRACTIONATED): Heparin Unfractionated: 0.16 IU/mL — ABNORMAL LOW (ref 0.30–0.70)

## 2015-04-01 MED ORDER — CETYLPYRIDINIUM CHLORIDE 0.05 % MT LIQD
7.0000 mL | Freq: Two times a day (BID) | OROMUCOSAL | Status: DC
  Administered 2015-04-01 – 2015-04-02 (×2): 7 mL via OROMUCOSAL

## 2015-04-01 MED ORDER — HEPARIN (PORCINE) IN NACL 100-0.45 UNIT/ML-% IJ SOLN
1750.0000 [IU]/h | INTRAMUSCULAR | Status: AC
  Administered 2015-04-01: 1400 [IU]/h via INTRAVENOUS
  Filled 2015-04-01 (×3): qty 250

## 2015-04-01 MED ORDER — RIVAROXABAN 20 MG PO TABS: 20.0000 mg | ORAL_TABLET | Freq: Every day | ORAL | Status: DC

## 2015-04-01 MED ORDER — ONDANSETRON HCL 4 MG/2ML IJ SOLN: 4.0000 mg | Freq: Four times a day (QID) | INTRAMUSCULAR | Status: DC | PRN

## 2015-04-01 MED ORDER — HEPARIN BOLUS VIA INFUSION
4000.0000 [IU] | Freq: Once | INTRAVENOUS | Status: AC
  Administered 2015-04-01: 4000 [IU] via INTRAVENOUS
  Filled 2015-04-01: qty 4000

## 2015-04-01 MED ORDER — ASPIRIN EC 81 MG PO TBEC
81.0000 mg | DELAYED_RELEASE_TABLET | Freq: Every day | ORAL | Status: DC
  Administered 2015-04-01: 81 mg via ORAL
  Filled 2015-04-01: qty 1

## 2015-04-01 MED ORDER — ACETAMINOPHEN 325 MG PO TABS: 650.0000 mg | ORAL_TABLET | Freq: Four times a day (QID) | ORAL | Status: DC | PRN

## 2015-04-01 MED ORDER — ADULT MULTIVITAMIN W/MINERALS CH
1.0000 | ORAL_TABLET | Freq: Every day | ORAL | Status: DC
  Administered 2015-04-01 – 2015-04-02 (×2): 1 via ORAL
  Filled 2015-04-01 (×2): qty 1

## 2015-04-01 MED ORDER — HEPARIN BOLUS VIA INFUSION
3000.0000 [IU] | Freq: Once | INTRAVENOUS | Status: AC
  Administered 2015-04-01: 3000 [IU] via INTRAVENOUS
  Filled 2015-04-01: qty 3000

## 2015-04-01 MED ORDER — ZOLPIDEM TARTRATE 5 MG PO TABS: 5.0000 mg | ORAL_TABLET | Freq: Every evening | ORAL | Status: DC | PRN

## 2015-04-01 MED ORDER — ONDANSETRON HCL 4 MG PO TABS: 4.0000 mg | ORAL_TABLET | Freq: Four times a day (QID) | ORAL | Status: DC | PRN

## 2015-04-01 MED ORDER — MELATONIN 3 MG PO TABS: 3.0000 mg | ORAL_TABLET | Freq: Every day | ORAL | Status: DC

## 2015-04-01 MED ORDER — METOPROLOL SUCCINATE ER 25 MG PO TB24
25.0000 mg | ORAL_TABLET | Freq: Every day | ORAL | Status: DC
  Administered 2015-04-01 – 2015-04-02 (×2): 25 mg via ORAL
  Filled 2015-04-01 (×2): qty 1

## 2015-04-01 MED ORDER — GABAPENTIN 300 MG PO CAPS
300.0000 mg | ORAL_CAPSULE | Freq: Two times a day (BID) | ORAL | Status: DC
  Administered 2015-04-01 – 2015-04-02 (×3): 300 mg via ORAL
  Filled 2015-04-01 (×3): qty 1

## 2015-04-01 MED ORDER — VITAMIN D3 25 MCG (1000 UNIT) PO TABS
2000.0000 [IU] | ORAL_TABLET | Freq: Every day | ORAL | Status: DC
  Administered 2015-04-01 – 2015-04-02 (×2): 2000 [IU] via ORAL
  Filled 2015-04-01 (×4): qty 2

## 2015-04-01 MED ORDER — NITROGLYCERIN 0.4 MG/SPRAY TL SOLN: 1.0000 | Status: DC | PRN

## 2015-04-01 MED ORDER — RIVAROXABAN 15 MG PO TABS
15.0000 mg | ORAL_TABLET | Freq: Two times a day (BID) | ORAL | Status: DC
  Administered 2015-04-01 – 2015-04-02 (×2): 15 mg via ORAL
  Filled 2015-04-01 (×2): qty 1

## 2015-04-01 MED ORDER — CLOPIDOGREL BISULFATE 75 MG PO TABS
75.0000 mg | ORAL_TABLET | Freq: Every day | ORAL | Status: DC
  Administered 2015-04-01 – 2015-04-02 (×2): 75 mg via ORAL
  Filled 2015-04-01 (×2): qty 1

## 2015-04-01 MED ORDER — IOHEXOL 350 MG/ML SOLN
75.0000 mL | Freq: Once | INTRAVENOUS | Status: AC | PRN
  Administered 2015-04-01: 75 mL via INTRAVENOUS

## 2015-04-01 MED ORDER — INFLUENZA VAC SPLIT QUAD 0.5 ML IM SUSY
0.5000 mL | PREFILLED_SYRINGE | INTRAMUSCULAR | Status: DC
  Filled 2015-04-01: qty 0.5

## 2015-04-01 MED ORDER — CITALOPRAM HYDROBROMIDE 20 MG PO TABS
20.0000 mg | ORAL_TABLET | Freq: Every day | ORAL | Status: DC
  Administered 2015-04-01 – 2015-04-02 (×2): 20 mg via ORAL
  Filled 2015-04-01 (×2): qty 1

## 2015-04-01 MED ORDER — SODIUM CHLORIDE 0.9 % IV SOLN
INTRAVENOUS | Status: DC
  Administered 2015-04-01: 10:00:00 via INTRAVENOUS

## 2015-04-01 MED ORDER — MULTIVITAMINS PO CAPS: 1.0000 | ORAL_CAPSULE | Freq: Every day | ORAL | Status: DC

## 2015-04-01 MED ORDER — ACETAMINOPHEN 650 MG RE SUPP: 650.0000 mg | Freq: Four times a day (QID) | RECTAL | Status: DC | PRN

## 2015-04-01 MED ORDER — POLYETHYL GLYCOL-PROPYL GLYCOL 0.4-0.3 % OP SOLN: 1.0000 [drp] | Freq: Every day | OPHTHALMIC | Status: DC

## 2015-04-01 MED ORDER — ATORVASTATIN CALCIUM 20 MG PO TABS
20.0000 mg | ORAL_TABLET | ORAL | Status: DC
  Administered 2015-04-01: 20 mg via ORAL
  Filled 2015-04-01 (×2): qty 1

## 2015-04-01 NOTE — Progress Notes (Signed)
PT Cancellation Note  Patient Details Name: Edward Woodward MRN: [id number] DOB: 04-Nov-1926   Cancelled Treatment:    Reason Eval/Treat Not Completed: Medical issues which prohibited therapy.  Pt just started on heparin.  First draw was sub therapeutic and RN reports switching to xarelto this PM.  Per PT department guidelines, we will await either therapeutic heparin level (which won't be drawn again until after 1700) or after starting xarelto x 2 hours (which also won't happen until later today).  PT will evaluate in AM.    Thanks,    Wells Guiles B. [place], [place], DPT [phone]   04/01/2015, 3:16 PM

## 2015-04-01 NOTE — Progress Notes (Signed)
Came to assess pt, pt is on CPAP from Home at this time, no distress noted. Pt O2 saturations are acceptable presenting at 96%. No complications noted. Family member at bedside.

## 2015-04-01 NOTE — ED Notes (Signed)
Patient returned from CT

## 2015-04-01 NOTE — ED Notes (Signed)
Hospitalist at the bedside 

## 2015-04-01 NOTE — Progress Notes (Signed)
PROGRESS NOTE  Edward Woodward XMI:[id number] DOB: November 14, 1926 DOA: 03/31/2015 PCP: Mathews Argyle, MD  HPI/Recap of past 24 hours: 81 yr old male w/past med hx of afib, CKD & PAD admitted in the early morning of 9/5 for acute respiratory failure secondary to PE.  Started on oxygen & IV heparin.  Seen after arrival to floor.  Assessment/Plan: Principal Problem:   Acute respiratory failure with hypoxia secondary to Pulmonary embolus: On oxygen.  Pt with mild CKD, but not severe.  Pharmacy to help dose Xarelto.  PT to see.  Will check ambulatory oxygen sats Active Problems:   Peripheral vascular disease: will stop asa & plavix.  (This was the blockage referred to in prev med hx, pt does NOT have prev hx of DVT)    Essential hypertension, benign: Continue home meds   OSA on CPAP      Cardiac pacemaker -[employer]   Atrial fibrillation/hx of sinus node dysfxn: s/p pacemaker.  Rate controlled.    CKD (chronic kidney disease), stage III: As above.  Monitor Crt closely given need to start Xarelto.   Chronic anemia: appears stable     Code Status: DNR  Family Communication: Wife at the bedside  Disposition Plan: Home in next 1-2 days   Consultants:  None  Procedures:  None  Antibiotics:  None   Objective: BP 145/65 mmHg  Pulse 78  Temp(Src) 98.8 F (37.1 C) (Oral)  Resp 26  Ht 5\' 10"  (1.778 m)  Wt 95.391 kg (210 lb 4.8 oz)  BMI 30.17 kg/m2  SpO2 97%  Intake/Output Summary (Last 24 hours) at 04/01/15 1239 Last data filed at 04/01/15 1140  Gross per 24 hour  Intake      0 ml  Output    200 ml  Net   -200 ml   Filed Weights   03/31/15 1842 04/01/15 0910  Weight: 92.534 kg (204 lb) 95.391 kg (210 lb 4.8 oz)    Exam:   General:  alrt & oriented, fatigued  Cardiovascular: RRR S1, S2, paced rhythm  Respiratory: rales  Abdomen: soft, non-tender, non-distended +BS  Musculoskeletal: Trace edema   Data Reviewed: Basic Metabolic  Panel:  Recent Labs Lab 03/31/15 1846 04/01/15 1000  NA 140 142  K 3.8 4.2  CL 103 106  CO2 27 27  GLUCOSE 144* 118*  BUN 29* 25*  CREATININE 1.78* 1.54*  CALCIUM 10.1 9.5   Liver Function Tests:  Recent Labs Lab 04/01/15 1000  AST 23  ALT 11*  ALKPHOS 52  BILITOT 0.5  PROT 5.8*  ALBUMIN 3.3*   No results for input(s): LIPASE, AMYLASE in the last 168 hours. No results for input(s): AMMONIA in the last 168 hours. CBC:  Recent Labs Lab 03/31/15 1846 04/01/15 1000  WBC 8.4 6.7  NEUTROABS  --  4.3  HGB 12.1* 10.9*  HCT 36.3* 32.9*  MCV 91.7 91.9  PLT 123* 102*   Cardiac Enzymes:    Recent Labs Lab 03/31/15 1846 04/01/15 1000  TROPONINI <0.03 0.04*   BNP (last 3 results)  Recent Labs  03/31/15 2045  BNP 193.2*    ProBNP (last 3 results) No results for input(s): PROBNP in the last 8760 hours.  CBG: No results for input(s): GLUCAP in the last 168 hours.  Recent Results (from the past 240 hour(s))  MRSA PCR Screening     Status: None   Collection Time: 04/01/15  9:08 AM  Result Value Ref Range Status   MRSA by PCR  NEGATIVE NEGATIVE Final    Comment:        The GeneXpert MRSA Assay (FDA approved for NASAL specimens only), is one component of a comprehensive MRSA colonization surveillance program. It is not intended to diagnose MRSA infection nor to guide or monitor treatment for MRSA infections.      Studies: Dg Chest 2 View  03/31/2015   CLINICAL DATA:  81 year old male with shortness of Breath 1 week. Personal history of collapsed lung. Initial encounter.  EXAM: CHEST  2 VIEW  COMPARISON:  07/24/2013.  FINDINGS: Stable lung volumes. Stable left chest cardiac pacemaker and sequelae of median sternotomy. Stable cardiac size and mediastinal contours. Chronic calcified atherosclerosis of the aorta. No pneumothorax, pulmonary edema, pleural effusion or acute pulmonary opacity. Stable small calcified granulomas in the right lung. No acute osseous  abnormality identified.  IMPRESSION: No acute cardiopulmonary abnormality.   Electronically Signed   By: Genevie Ann M.D.   On: 03/31/2015 19:52   Ct Angio Chest Pe W/cm &/or Wo Cm  04/01/2015   CLINICAL DATA:  81 year old male with shortness of breath. History of lymphoma and prostate cancer.  EXAM: CT ANGIOGRAPHY CHEST WITH CONTRAST  TECHNIQUE: Multidetector CT imaging of the chest was performed using the standard protocol during bolus administration of intravenous contrast. Multiplanar CT image reconstructions and MIPs were obtained to evaluate the vascular anatomy.  CONTRAST:  110mL OMNIPAQUE IOHEXOL 350 MG/ML SOLN  COMPARISON:  Chest radiograph dated 03/31/2015  FINDINGS: Mild emphysema. No focal consolidation or pneumothorax. Small scattered right lung calcified nodules compatible with old granuloma. Small right pleural effusion. The central airways are patent.  There is atherosclerotic calcification of the thoracic aorta. There is a large pulmonary embolus involving the central and lobar right pulmonary artery and extending into the segmental branches of the right lower and right upper lobes. Small left upper lobe pulmonary artery embolus also noted. There is extensive coronary vascular calcification. Top-normal cardiac size. No pericardial effusion. No definite evidence of right cardiac strain. Clinical correlation is recommended. There is a left pectoral pacemaker device. No lymphadenopathy. The visualized thyroid gland appears unremarkable.  There is no axillary adenopathy. Median sternotomy wires noted. There is degenerative changes of the spine with lower thoracic disc desiccation and vacuum phenomenon. No acute fracture. Gallstones noted. There is distention of the gallbladder. Multiple bilateral renal hypodense lesions, incompletely characterized likely corresponding to the cyst seen on the prior ultrasound. Follow-up with ultrasound as previously recommended.  Review of the MIP images confirms the above  findings.  IMPRESSION: Large right central pulmonary artery embolus with smaller left upper lobe pulmonary artery embolus.  Cholelithiasis.  Incompletely characterized renal hypodense lesions. Follow-up with ultrasound recommended.  Critical Value/emergent results were called by telephone at the time of interpretation on 04/01/2015 at 2:34 am to the ER physician , who verbally acknowledged these results.   Electronically Signed   By: Anner Crete M.D.   On: 04/01/2015 02:44    Scheduled Meds: . aspirin EC  81 mg Oral QHS  . atorvastatin  20 mg Oral QODAY  . cholecalciferol  2,000 Units Oral Daily  . citalopram  20 mg Oral Daily  . clopidogrel  75 mg Oral Daily  . gabapentin  300 mg Oral BID  . metoprolol succinate  25 mg Oral Daily  . multivitamin with minerals  1 tablet Oral Daily  . rivaroxaban  15 mg Oral BID WC   Followed by  . [START ON 04/23/2015] rivaroxaban  20 mg Oral  Q supper    Continuous Infusions: . sodium chloride 10 mL/hr at 04/01/15 1028  . heparin 1,750 Units/hr (04/01/15 1140)     Time spent: 25 minutes  [facility] Pager [phone]. If 7PM-7AM, please contact night-coverage at [email or web address], password [facility] 04/01/2015, 12:39 PM  LOS: 0 days

## 2015-04-01 NOTE — ED Notes (Signed)
Patient transported to CT 

## 2015-04-01 NOTE — H&P (Signed)
[facility] History and Physical  Edward Woodward RJJ:[id number] DOB: August 17, 1926 DOA: 03/31/2015  Referring physician: Dr. Leonides Schanz. PCP: Mathews Argyle, MD  Specialists: Dr. Learta Codding. Oncologist.  Chief Complaint: Shortness of breath.  HPI: Edward Woodward is a 81 y.o. male with history of CAD status post CABG status post stenting, OSA on C Pap, chronic kidney disease stage III, non-Hodgkin's lymphoma in remission. History of prostate cancer, chronic anemia presents to the ER because of worsening shortness of breath over the last 5 days. Denies any chest pain or productive cough fever chills. CT angiographic chest shows large central pulmonary embolism. On-call pulmonologist was consulted by ER physician and patient has been admitted for further management. On exam patient is hemodynamically stable but mildly tachycardic. Denies any recent surgery or long distance travel. Patient states he may have had a previous history of right lower extremities DVT couple of years ago.   Review of Systems: As presented in the history of presenting illness, rest negative.  Past Medical History  Diagnosis Date  . Cancer   . Prostate cancer   . Neuropathy   . Hypercholesteremia   . Depressive disorder, not elsewhere classified   . Angina pectoris   . Sinoatrial node dysfunction   . Degeneration of lumbar or lumbosacral intervertebral disc   . Dyspnea   . CAD (coronary artery disease)      NYHA Class 1B, CABG 1985  . HTN (hypertension)   . OSA on CPAP   . Collagen vascular disease    Past Surgical History  Procedure Laterality Date  . Coronary artery bypass graft    . Exploratory laparotomy    . Endarterectomy    . Rotator cuff repair    . Lumbar fusion    . Coronary angioplasty with stent placement      s/p bare metal stent implant SVG-diagonal 11/23/05, s/p BM stent implantation SVG diagonal 10/22/06 (feeds LAD), and 05/2010 with DES to left circumflex  . Pacemaker insertion      Social History:  reports that he has never smoked. He does not have any smokeless tobacco history on file. He reports that he does not drink alcohol or use illicit drugs. Where does patient live home. Can patient participate in ADLs? Yes.  Allergies  Allergen Reactions  . Penicillins Swelling  . Simvastatin Other (See Comments)    Muscle weakness in legs ?    Family History:  Family History  Problem Relation Age of Onset  . Heart disease Father       Prior to Admission medications   Medication Sig Start Date End Date Taking? Authorizing Provider  aspirin EC 81 MG tablet Take 1 tablet (81 mg total) by mouth daily. Patient taking differently: Take 81 mg by mouth at bedtime.  02/21/14  Yes [name], MD  atorvastatin (LIPITOR) 20 MG tablet Take 20 mg by mouth every other day.   Yes Historical Provider, MD  b complex vitamins tablet Take 1 tablet by mouth daily.     Yes Historical Provider, MD  Cholecalciferol (VITAMIN D3) 2000 UNITS TABS Take 2 tablets by mouth daily.    Yes Historical Provider, MD  citalopram (CELEXA) 20 MG tablet Take 20 mg by mouth daily.     Yes Historical Provider, MD  clopidogrel (PLAVIX) 75 MG tablet Take 75 mg by mouth daily.     Yes Historical Provider, MD  furosemide (LASIX) 40 MG tablet TAKE TWO TABLETS BY MOUTH ONCE DAILY 12/17/14  Yes Lynnell Dike  Tamala Julian, MD  gabapentin (NEURONTIN) 300 MG capsule Take 300 mg by mouth 2 (two) times daily. 2 times daily w/ additional dose PRN, not to exceed TID   Yes Historical Provider, MD  Leuprolide Acetate, 6 Month, (LUPRON) 45 MG injection Inject 45 mg into the muscle daily. 11/08/14 10/28/16 Yes Historical Provider, MD  Melatonin 3 MG TABS Take 3 mg by mouth at bedtime.   Yes Historical Provider, MD  metoprolol succinate (TOPROL-XL) 25 MG 24 hr tablet TAKE ONE TABLET BY MOUTH ONCE DAILY 02/13/15  Yes Dayna N Dunn, PA-C  Multiple Vitamin (MULTIVITAMIN) capsule Take 1 capsule by mouth daily.   Yes Historical Provider, MD   nitroGLYCERIN (NITROLINGUAL) 0.4 MG/SPRAY spray Place 1 spray under the tongue every 5 (five) minutes x 3 doses as needed for chest pain. 04/04/14  Yes Belva Crome, MD  Nutritional Supplements (MELATONIN PO) Take 1 tablet by mouth at bedtime.     Yes Historical Provider, MD  OMEGA-3 KRILL OIL PO Take 2 capsules by mouth daily.   Yes Historical Provider, MD  Polyethyl Glycol-Propyl Glycol (SYSTANE) 0.4-0.3 % SOLN Apply 1 drop to eye at bedtime.   Yes Historical Provider, MD  UBIQUINOL PO Take 1 tablet by mouth daily. CoQ10   Yes Historical Provider, MD  zolpidem (AMBIEN) 5 MG tablet Take 5 mg by mouth at bedtime as needed for sleep.  02/28/15  Yes Historical Provider, MD    Physical Exam: Filed Vitals:   04/01/15 0430 04/01/15 0445 04/01/15 0500 04/01/15 0530  BP: 108/44 130/69 107/50 114/49  Pulse: 110 105 108 108  Temp:      TempSrc:      Resp: 22 23 15 22   Height:      Weight:      SpO2: 95% 97% 98% 97%     General:  Moderately built and nourished.  Eyes: Anicteric no pallor.  ENT: No discharge from the ears eyes nose or mouth.  Neck: No mass felt. No JVD appreciated.  Cardiovascular: S1-S2 heard.  Respiratory: No rhonchi or crepitations.  Abdomen: Soft nontender bowel sounds present.  Skin: No rash.  Musculoskeletal: No edema.  Psychiatric: Appears normal.  Neurologic: Alert awake oriented to time place and person. Moves all extremities.  Labs on Admission:  Basic Metabolic Panel:  Recent Labs Lab 03/31/15 1846  NA 140  K 3.8  CL 103  CO2 27  GLUCOSE 144*  BUN 29*  CREATININE 1.78*  CALCIUM 10.1   Liver Function Tests: No results for input(s): AST, ALT, ALKPHOS, BILITOT, PROT, ALBUMIN in the last 168 hours. No results for input(s): LIPASE, AMYLASE in the last 168 hours. No results for input(s): AMMONIA in the last 168 hours. CBC:  Recent Labs Lab 03/31/15 1846  WBC 8.4  HGB 12.1*  HCT 36.3*  MCV 91.7  PLT 123*   Cardiac Enzymes:  Recent  Labs Lab 03/31/15 1846  TROPONINI <0.03    BNP (last 3 results)  Recent Labs  03/31/15 2045  BNP 193.2*    ProBNP (last 3 results) No results for input(s): PROBNP in the last 8760 hours.  CBG: No results for input(s): GLUCAP in the last 168 hours.  Radiological Exams on Admission: Dg Chest 2 View  03/31/2015   CLINICAL DATA:  81 year old male with shortness of Breath 1 week. Personal history of collapsed lung. Initial encounter.  EXAM: CHEST  2 VIEW  COMPARISON:  07/24/2013.  FINDINGS: Stable lung volumes. Stable left chest cardiac pacemaker and sequelae of median  sternotomy. Stable cardiac size and mediastinal contours. Chronic calcified atherosclerosis of the aorta. No pneumothorax, pulmonary edema, pleural effusion or acute pulmonary opacity. Stable small calcified granulomas in the right lung. No acute osseous abnormality identified.  IMPRESSION: No acute cardiopulmonary abnormality.   Electronically Signed   By: Genevie Ann M.D.   On: 03/31/2015 19:52   Ct Angio Chest Pe W/cm &/or Wo Cm  04/01/2015   CLINICAL DATA:  81 year old male with shortness of breath. History of lymphoma and prostate cancer.  EXAM: CT ANGIOGRAPHY CHEST WITH CONTRAST  TECHNIQUE: Multidetector CT imaging of the chest was performed using the standard protocol during bolus administration of intravenous contrast. Multiplanar CT image reconstructions and MIPs were obtained to evaluate the vascular anatomy.  CONTRAST:  18mL OMNIPAQUE IOHEXOL 350 MG/ML SOLN  COMPARISON:  Chest radiograph dated 03/31/2015  FINDINGS: Mild emphysema. No focal consolidation or pneumothorax. Small scattered right lung calcified nodules compatible with old granuloma. Small right pleural effusion. The central airways are patent.  There is atherosclerotic calcification of the thoracic aorta. There is a large pulmonary embolus involving the central and lobar right pulmonary artery and extending into the segmental branches of the right lower and right  upper lobes. Small left upper lobe pulmonary artery embolus also noted. There is extensive coronary vascular calcification. Top-normal cardiac size. No pericardial effusion. No definite evidence of right cardiac strain. Clinical correlation is recommended. There is a left pectoral pacemaker device. No lymphadenopathy. The visualized thyroid gland appears unremarkable.  There is no axillary adenopathy. Median sternotomy wires noted. There is degenerative changes of the spine with lower thoracic disc desiccation and vacuum phenomenon. No acute fracture. Gallstones noted. There is distention of the gallbladder. Multiple bilateral renal hypodense lesions, incompletely characterized likely corresponding to the cyst seen on the prior ultrasound. Follow-up with ultrasound as previously recommended.  Review of the MIP images confirms the above findings.  IMPRESSION: Large right central pulmonary artery embolus with smaller left upper lobe pulmonary artery embolus.  Cholelithiasis.  Incompletely characterized renal hypodense lesions. Follow-up with ultrasound recommended.  Critical Value/emergent results were called by telephone at the time of interpretation on 04/01/2015 at 2:34 am to the ER physician , who verbally acknowledged these results.   Electronically Signed   By: Anner Crete M.D.   On: 04/01/2015 02:44    EKG: Independently reviewed. Ventricular Paced rhythm.  Assessment/Plan Principal Problem:   Pulmonary embolus Active Problems:   Essential hypertension, benign   OSA on CPAP   Sinus node dysfunction chronotropic incompetence   Cardiac pacemaker -[employer]   CKD (chronic kidney disease), stage III   Chronic anemia   1. Acute pulmonary embolism unprovoked presently hemodynamically stable - patient has been started on heparin infusion which will be continued. Once patient is hemodynamically stable and may change to oral anticoagulents. Check Dopplers of the lower extremity to rule out  DVT. Cycle cardiac markers. I have advised patient to follow-up with patient's oncologist for duration of oral anti-coagulation. 2. Hypertension and history of LV dysfunction last EF measured was 60-65% in July 2015 - continue metoprolol but I'm holding of Lasix for now. If patient continues to be relatively stable may restart Lasix. 3. OSA on C Pap. 4. Chronic kidney disease stage III creatinine appears to be at baseline. 5. Chronic anemia - follow CBC. 6. History of non-Hodgkin's lymphoma in remission and is following with oncologist. 7. History of prostate cancer on Lupron. 8. CAD status post CABG and stenting - denies any chest  pain.  I have reviewed patient's old charts labs. Personally reviewed patient's EKG and chest x-ray.   DVT Prophylaxis heparin infusion.  Code Status: DO NOT RESUSCITATE.  Family Communication: Discussed with patient's wife.  Disposition Plan: Admit to inpatient.    Nancey Kreitz N. [facility] Pager [phone].  If 7PM-7AM, please contact night-coverage [email or web address] Password [email or web address] 04/01/2015, 5:53 AM

## 2015-04-01 NOTE — Progress Notes (Addendum)
ANTICOAGULATION CONSULT NOTE - Follow Up Consult  Pharmacy Consult for Heparin>>Xarelto Indication: pulmonary embolus  Allergies  Allergen Reactions  . Penicillins Swelling  . Simvastatin Other (See Comments)    Muscle weakness in legs ?    Patient Measurements: Height: 5\' 10"  (177.8 cm) Weight: 210 lb 4.8 oz (95.391 kg) IBW/kg (Calculated) : 73 Heparin Dosing Weight: 95 kg  Vital Signs: Temp: 98.8 F (37.1 C) (09/05 0125) Temp Source: Oral (09/05 0125) BP: 145/65 mmHg (09/05 0855) Pulse Rate: 78 (09/05 0855)  Labs:  Recent Labs  03/31/15 1846 04/01/15 0900 04/01/15 1000  HGB 12.1*  --  10.9*  HCT 36.3*  --  32.9*  PLT 123*  --  PENDING  HEPARINUNFRC  --  0.16*  --   CREATININE 1.78*  --   --   TROPONINI <0.03  --   --     Estimated Creatinine Clearance: 35.2 mL/min (by C-G formula based on Cr of 1.78).   Medications:  Infusions:  . sodium chloride 10 mL/hr at 04/01/15 1028  . heparin 1,400 Units/hr (04/01/15 0305)    Assessment: 81 year old male with a history of DVT admitted with bilateral pulmonary emboli.  He is receiving anticoagulation with heparin and his first heparin level is subtherapeutic.  Platelet count somewhat low at 123K at baseline, Hgb ok. No bleeding complications noted.  Goal of Therapy:  Heparin level 0.3-0.7 units/ml Monitor platelets by anticoagulation protocol: Yes   Plan:  Give Heparin 3000 units IV bolus Increase Heparin infusion to 1750 units/hr Check Heparin level in 6 hours  Legrand Como, Pharm.D., BCPS, AAHIVP Clinical Pharmacist Phone: [phone] or [phone] 04/01/2015, 10:54 AM  Addendum: Consult request received to change patient to Xarelto. CrCl ~40 ml/min.  Xarelto 15mg  PO BID with food x 21 days, then 20mg  PO qday with food. Will start with the evening meal tonight. Stop Heparin at the same time the first dose of Xarelto is adminstered.  Legrand Como, Pharm.D., BCPS, AAHIVP Clinical Pharmacist Phone:  [phone] or [phone] 04/01/2015, 11:27 AM

## 2015-04-01 NOTE — Care Management Note (Signed)
Case Management Note  Patient Details  Name: Edward Woodward MRN: [id number] Date of Birth: 04/01/1927  Subjective/Objective:     Adm w pul embolus               Action/Plan:lives w fam, pcp dr Christiane Ha [place]   Expected Discharge Date:                  Expected Discharge Plan:  Home/Self Care  In-House Referral:     Discharge planning Services     Post Acute Care Choice:    Choice offered to:     DME Arranged:    DME Agency:     HH Arranged:    [place] Agency:     Status of Service:     Medicare Important Message Given:    Date Medicare IM Given:    Medicare IM give by:    Date Additional Medicare IM Given:    Additional Medicare Important Message give by:     If discussed at [place] of Stay Meetings, dates discussed:    Additional Comments: ur review done  Lacretia Leigh, RN 04/01/2015, 11:28 AM

## 2015-04-01 NOTE — ED Notes (Signed)
Updated with CT department estimated time of exam.

## 2015-04-01 NOTE — ED Provider Notes (Signed)
12:00 AM  Assumed care from Dr. Betsey Holiday.  Pt is a 81 y.o. male with shortness of breath for one week. Has had a previous left lower extremity DVT. Is on Plavix and aspirin but no anticoagulation. Patient has been tachycardic. He is on 2 L of oxygen and sats > 95%.  CXR clear.  BNP less than 200.  Troponin negative.  CT chest pending.  He will need admission.   3:00 AM  CT of patient's chest shows a large right pulmonary embolus and a small left pulmonary embolus. No sign of RV strain. Discussed with pulmonology on call Dr. Lamonte Sakai. He is reviewed patient's imaging. Agrees that patient can receive heparin and be admitted to stepdown by hospitalist. Discussed with hospitalist on-call for admission. Updated patient and family. He is currently very comfortable.    EKG Interpretation  Date/Time:  Sunday March 31 2015 20:28:46 EDT Ventricular Rate:  107 PR Interval:  27 QRS Duration: 128 QT Interval:  420 QTC Calculation: 560 R Axis:   -66 Text Interpretation:  VENTRICULAR PACED RHYTHM Confirmed by Betsey Holiday  MD, CHRISTOPHER [phone]) on 03/31/2015 8:38:45 PM        [place], DO 04/01/15 2122

## 2015-04-01 NOTE — Progress Notes (Signed)
ANTICOAGULATION CONSULT NOTE - Initial Consult  Pharmacy Consult for Heparin Indication: pulmonary embolus  Allergies  Allergen Reactions  . Penicillins Swelling  . Simvastatin Other (See Comments)    Muscle weakness in legs ?    Patient Measurements: Height: 5\' 10"  (177.8 cm) Weight: 204 lb (92.534 kg) IBW/kg (Calculated) : 73 Heparin Dosing Weight: 90 kg  Vital Signs: Temp: 98.8 F (37.1 C) (09/05 0125) Temp Source: Oral (09/05 0125) BP: 134/91 mmHg (09/05 0230) Pulse Rate: 109 (09/05 0230)  Labs:  Recent Labs  03/31/15 1846  HGB 12.1*  HCT 36.3*  PLT 123*  CREATININE 1.78*  TROPONINI <0.03    Estimated Creatinine Clearance: 34.7 mL/min (by C-G formula based on Cr of 1.78).   Medical History: Past Medical History  Diagnosis Date  . Cancer   . Prostate cancer   . Neuropathy   . Hypercholesteremia   . Depressive disorder, not elsewhere classified   . Angina pectoris   . Sinoatrial node dysfunction   . Degeneration of lumbar or lumbosacral intervertebral disc   . Dyspnea   . CAD (coronary artery disease)      NYHA Class 1B, CABG 1985  . HTN (hypertension)   . OSA on CPAP   . Collagen vascular disease     Medications:  ASA  Lasix Toprol XL  Ntg  Lipitor  Vit B  Vit D  Celexa  Plavix  Neurontin  Melatonin  Ambien  Assessment: 81 y.o. male with PE for heparin  Goal of Therapy:  Heparin level 0.3-0.7 units/ml Monitor platelets by anticoagulation protocol: Yes   Plan:  Heparin 4000 units IV bolus, then start heparin 1400 units/hr Check heparin level in 8 hours.  Salif Tay, [place] Curb 04/01/2015,2:44 AM

## 2015-04-02 ENCOUNTER — Telehealth: Payer: Self-pay | Admitting: Interventional Cardiology

## 2015-04-02 ENCOUNTER — Inpatient Hospital Stay (HOSPITAL_COMMUNITY): Payer: Medicare Other

## 2015-04-02 DIAGNOSIS — I2699 Other pulmonary embolism without acute cor pulmonale: Principal | ICD-10-CM

## 2015-04-02 DIAGNOSIS — N183 Chronic kidney disease, stage 3 (moderate): Secondary | ICD-10-CM

## 2015-04-02 DIAGNOSIS — D649 Anemia, unspecified: Secondary | ICD-10-CM

## 2015-04-02 DIAGNOSIS — R52 Pain, unspecified: Secondary | ICD-10-CM

## 2015-04-02 LAB — CBC
HEMATOCRIT: 32.3 % — AB (ref 39.0–52.0)
HEMOGLOBIN: 10.4 g/dL — AB (ref 13.0–17.0)
MCH: 29.5 pg (ref 26.0–34.0)
MCHC: 32.2 g/dL (ref 30.0–36.0)
MCV: 91.8 fL (ref 78.0–100.0)
Platelets: 103 10*3/uL — ABNORMAL LOW (ref 150–400)
RBC: 3.52 MIL/uL — AB (ref 4.22–5.81)
RDW: 14.1 % (ref 11.5–15.5)
WBC: 6.8 10*3/uL (ref 4.0–10.5)

## 2015-04-02 LAB — BASIC METABOLIC PANEL
ANION GAP: 8 (ref 5–15)
BUN: 19 mg/dL (ref 6–20)
CHLORIDE: 106 mmol/L (ref 101–111)
CO2: 27 mmol/L (ref 22–32)
Calcium: 9.4 mg/dL (ref 8.9–10.3)
Creatinine, Ser: 1.47 mg/dL — ABNORMAL HIGH (ref 0.61–1.24)
GFR calc non Af Amer: 42 mL/min — ABNORMAL LOW (ref >60)
GFR, EST AFRICAN AMERICAN: 48 mL/min — AB (ref >60)
Glucose, Bld: 103 mg/dL — ABNORMAL HIGH (ref 65–99)
POTASSIUM: 3.6 mmol/L (ref 3.5–5.1)
Sodium: 141 mmol/L (ref 135–145)

## 2015-04-02 MED ORDER — RIVAROXABAN (XARELTO) VTE STARTER PACK (15 & 20 MG): ORAL_TABLET | ORAL | Status: DC

## 2015-04-02 NOTE — Progress Notes (Signed)
*  PRELIMINARY RESULTS* Vascular Ultrasound Lower extremity venous duplex has been completed.  Preliminary findings: DVT noted in the Left common femoral and femoral veins. No DVT RLE.    Landry Mellow, RDMS, RVT  04/02/2015, 3:56 PM

## 2015-04-02 NOTE — Care Management Note (Addendum)
Case Management Note  Patient Details  Name: Edward Woodward MRN: [id number] Date of Birth: 09/22/1926 r Subjective/Objective: Pt admitted for PE. Pt is from home with wife. Plan is to return home with [facility] services.                     Action/Plan: Both pt and wife agreeable for [facility] services with [facility]. CM did make referral with [facility] and SOC to begin within 24-48 hrs post d/c. No further needs from CM at this time. Xarelto 30 day free card provided to pt. Pt will need a Rx for the 30 day free no refills and the original Rx. 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 CM has benefits check in process will make pt aware once completed. No further needs at this time.    Expected Discharge Date:                  Expected Discharge Plan:  [place]  In-House Referral:  NA  Discharge planning Services  CM Consult  Post Acute Care Choice:  Home Health Choice offered to:  Patient  DME Arranged:  N/A DME Agency:  NA  HH Arranged:  [place] Agency:  [place]  Status of Service:  Completed, signed off  Medicare Important Message Given:    Date Medicare IM Given:    Medicare IM give by:    Date Additional Medicare IM Given:    Additional Medicare Important Message give by:     If discussed at [place] of Stay Meetings, dates discussed:    Additional Comments:  Co pay for Xarelto will be $45.00. CM will make pt aware of cost. No further needs from CM at this time.  Bethena Roys, RN 04/02/2015, 11:10 AM

## 2015-04-02 NOTE — Telephone Encounter (Signed)
Returned pt wife call. Adv her that Dr.Smith is aware of pt hospitalization and is in agreement with the physicians taking care of the pt. Pt is not under cardiology care. If they feel pt is having cardiac issues, they will rqst cardiology round on pt Pt wife appreciative for the call back and verbalized understanding.

## 2015-04-02 NOTE — Discharge Summary (Addendum)
Physician Discharge Summary  Edward Woodward ZOX:[id number] DOB: 22-Jun-1927 DOA: 03/31/2015  PCP: Mathews Argyle, MD  Admit date: 03/31/2015 Discharge date: 04/02/2015  Time spent: 35 minutes  Recommendations for Outpatient Follow-up:  1. CBC/BMP 1 week- started on xarelto  Discharge Diagnoses:  Principal Problem:   Pulmonary embolus Active Problems:   Peripheral vascular disease   Essential hypertension, benign   OSA on CPAP   Sinus node dysfunction chronotropic incompetence   Cardiac pacemaker -[employer]   Atrial fibrillation   CKD (chronic kidney disease), stage III   Chronic anemia   Acute respiratory failure with hypoxia DVT  Discharge Condition: improved  Diet recommendation: cardiac  Filed Weights   03/31/15 1842 04/01/15 0910  Weight: 92.534 kg (204 lb) 95.391 kg (210 lb 4.8 oz)    History of present illness:  Edward Woodward is a 81 y.o. male with history of CAD status post CABG status post stenting, OSA on C Pap, chronic kidney disease stage III, non-Hodgkin's lymphoma in remission. History of prostate cancer, chronic anemia presents to the ER because of worsening shortness of breath over the last 5 days. Denies any chest pain or productive cough fever chills. CT angiographic chest shows large central pulmonary embolism. On-call pulmonologist was consulted by ER physician and patient has been admitted for further management. On exam patient is hemodynamically stable but mildly tachycardic. Denies any recent surgery or long distance travel. Patient states he may have had a previous history of right lower extremities DVT couple of years ago.    Hospital Course:  Acute respiratory failure with hypoxia secondary to Pulmonary embolus: On oxygen. Pt with mild CKD, but not severe. Pharmacy to help dose Xarelto. PT- O2 sats > 90 through out -wife insistant on duplex: DVT noted in the Left common femoral and femoral veins.   Peripheral vascular disease: will  stop asa & plavix. (This was the blockage referred to in prev med hx, pt does NOT have prev hx of DVT)   Essential hypertension, benign: Continue home meds  OSA on CPAP    Cardiac pacemaker -[employer]  Atrial fibrillation/hx of sinus node dysfxn: s/p pacemaker. Rate controlled.   CKD (chronic kidney disease), stage III: As above. Monitor Crt closely given need to start Xarelto.  Chronic anemia: appears stable  Procedures:    Consultations:    Discharge Exam: Filed Vitals:   04/02/15 1410  BP: 146/65  Pulse: 65  Temp: 99.5 F (37.5 C)  Resp: 18    General: A+Ox3, NAD   Discharge Instructions   Discharge Instructions    Diet - low sodium heart healthy    Complete by:  As directed      Discharge instructions    Complete by:  As directed   Cbc 1 week     Increase activity slowly    Complete by:  As directed           Current Discharge Medication List    START taking these medications   Details  Rivaroxaban (XARELTO STARTER PACK) 15 & 20 MG TBPK Take as directed on package: Start with one 15mg  tablet by mouth twice a day with food. On Day 22, switch to one 20mg  tablet once a day with food. Qty: 51 each, Refills: 0      CONTINUE these medications which have NOT CHANGED   Details  atorvastatin (LIPITOR) 20 MG tablet Take 20 mg by mouth every other day.    b complex vitamins tablet Take 1 tablet  by mouth daily.     Associated Diagnoses: Other malignant lymphomas, unspecified site, extranodal and solid organ sites; Anemia, unspecified    Cholecalciferol (VITAMIN D3) 2000 UNITS TABS Take 2 tablets by mouth daily.    Associated Diagnoses: Other malignant lymphomas, unspecified site, extranodal and solid organ sites    citalopram (CELEXA) 20 MG tablet Take 20 mg by mouth daily.     Associated Diagnoses: Other malignant lymphomas, unspecified site, extranodal and solid organ sites; Anemia, unspecified    furosemide (LASIX) 40 MG tablet TAKE TWO  TABLETS BY MOUTH ONCE DAILY Qty: 60 tablet, Refills: 5    gabapentin (NEURONTIN) 300 MG capsule Take 300 mg by mouth 2 (two) times daily. 2 times daily w/ additional dose PRN, not to exceed TID   Associated Diagnoses: Other malignant lymphomas, unspecified site, extranodal and solid organ sites; Anemia, unspecified    Melatonin 3 MG TABS Take 3 mg by mouth at bedtime.    metoprolol succinate (TOPROL-XL) 25 MG 24 hr tablet TAKE ONE TABLET BY MOUTH ONCE DAILY Qty: 30 tablet, Refills: 3    Multiple Vitamin (MULTIVITAMIN) capsule Take 1 capsule by mouth daily.   Associated Diagnoses: Other malignant lymphomas, unspecified site, extranodal and solid organ sites    nitroGLYCERIN (NITROLINGUAL) 0.4 MG/SPRAY spray Place 1 spray under the tongue every 5 (five) minutes x 3 doses as needed for chest pain. Qty: 12 g, Refills: 4   Associated Diagnoses: Atherosclerosis of native coronary artery of native heart without angina pectoris    OMEGA-3 KRILL OIL PO Take 2 capsules by mouth daily.    Polyethyl Glycol-Propyl Glycol (SYSTANE) 0.4-0.3 % SOLN Apply 1 drop to eye at bedtime.    UBIQUINOL PO Take 1 tablet by mouth daily. CoQ10    zolpidem (AMBIEN) 5 MG tablet Take 5 mg by mouth at bedtime as needed for sleep.       STOP taking these medications     aspirin EC 81 MG tablet      clopidogrel (PLAVIX) 75 MG tablet      Leuprolide Acetate, 6 Month, (LUPRON) 45 MG injection        Allergies  Allergen Reactions  . Penicillins Swelling  . Simvastatin Other (See Comments)    Muscle weakness in legs ?   Follow-up Information    Follow up with [facility].   Why:  Physical Thearpy   Contact information:   [address] [place] [address] [phone]       Follow up with Mathews Argyle, MD In 1 week.   Specialty:  Internal Medicine   Contact information:   [address] [place] [address] [phone]        The results of  significant diagnostics from this hospitalization (including imaging, microbiology, ancillary and laboratory) are listed below for reference.    Significant Diagnostic Studies: Dg Chest 2 View  03/31/2015   CLINICAL DATA:  81 year old male with shortness of Breath 1 week. Personal history of collapsed lung. Initial encounter.  EXAM: CHEST  2 VIEW  COMPARISON:  07/24/2013.  FINDINGS: Stable lung volumes. Stable left chest cardiac pacemaker and sequelae of median sternotomy. Stable cardiac size and mediastinal contours. Chronic calcified atherosclerosis of the aorta. No pneumothorax, pulmonary edema, pleural effusion or acute pulmonary opacity. Stable small calcified granulomas in the right lung. No acute osseous abnormality identified.  IMPRESSION: No acute cardiopulmonary abnormality.   Electronically Signed   By: Genevie Ann M.D.   On:  03/31/2015 19:52   Ct Angio Chest Pe W/cm &/or Wo Cm  04/01/2015   CLINICAL DATA:  81 year old male with shortness of breath. History of lymphoma and prostate cancer.  EXAM: CT ANGIOGRAPHY CHEST WITH CONTRAST  TECHNIQUE: Multidetector CT imaging of the chest was performed using the standard protocol during bolus administration of intravenous contrast. Multiplanar CT image reconstructions and MIPs were obtained to evaluate the vascular anatomy.  CONTRAST:  70mL OMNIPAQUE IOHEXOL 350 MG/ML SOLN  COMPARISON:  Chest radiograph dated 03/31/2015  FINDINGS: Mild emphysema. No focal consolidation or pneumothorax. Small scattered right lung calcified nodules compatible with old granuloma. Small right pleural effusion. The central airways are patent.  There is atherosclerotic calcification of the thoracic aorta. There is a large pulmonary embolus involving the central and lobar right pulmonary artery and extending into the segmental branches of the right lower and right upper lobes. Small left upper lobe pulmonary artery embolus also noted. There is extensive coronary vascular calcification.  Top-normal cardiac size. No pericardial effusion. No definite evidence of right cardiac strain. Clinical correlation is recommended. There is a left pectoral pacemaker device. No lymphadenopathy. The visualized thyroid gland appears unremarkable.  There is no axillary adenopathy. Median sternotomy wires noted. There is degenerative changes of the spine with lower thoracic disc desiccation and vacuum phenomenon. No acute fracture. Gallstones noted. There is distention of the gallbladder. Multiple bilateral renal hypodense lesions, incompletely characterized likely corresponding to the cyst seen on the prior ultrasound. Follow-up with ultrasound as previously recommended.  Review of the MIP images confirms the above findings.  IMPRESSION: Large right central pulmonary artery embolus with smaller left upper lobe pulmonary artery embolus.  Cholelithiasis.  Incompletely characterized renal hypodense lesions. Follow-up with ultrasound recommended.  Critical Value/emergent results were called by telephone at the time of interpretation on 04/01/2015 at 2:34 am to the ER physician , who verbally acknowledged these results.   Electronically Signed   By: Anner Crete M.D.   On: 04/01/2015 02:44    Microbiology: Recent Results (from the past 240 hour(s))  MRSA PCR Screening     Status: None   Collection Time: 04/01/15  9:08 AM  Result Value Ref Range Status   MRSA by PCR NEGATIVE NEGATIVE Final    Comment:        The GeneXpert MRSA Assay (FDA approved for NASAL specimens only), is one component of a comprehensive MRSA colonization surveillance program. It is not intended to diagnose MRSA infection nor to guide or monitor treatment for MRSA infections.      Labs: Basic Metabolic Panel:  Recent Labs Lab 03/31/15 1846 04/01/15 1000 04/02/15 0541  NA 140 142 141  K 3.8 4.2 3.6  CL 103 106 106  CO2 27 27 27   GLUCOSE 144* 118* 103*  BUN 29* 25* 19  CREATININE 1.78* 1.54* 1.47*  CALCIUM 10.1 9.5  9.4   Liver Function Tests:  Recent Labs Lab 04/01/15 1000  AST 23  ALT 11*  ALKPHOS 52  BILITOT 0.5  PROT 5.8*  ALBUMIN 3.3*   No results for input(s): LIPASE, AMYLASE in the last 168 hours. No results for input(s): AMMONIA in the last 168 hours. CBC:  Recent Labs Lab 03/31/15 1846 04/01/15 1000 04/02/15 0541  WBC 8.4 6.7 6.8  NEUTROABS  --  4.3  --   HGB 12.1* 10.9* 10.4*  HCT 36.3* 32.9* 32.3*  MCV 91.7 91.9 91.8  PLT 123* 102* 103*   Cardiac Enzymes:  Recent Labs Lab 03/31/15 1846 04/01/15  1000 04/01/15 1440 04/01/15 2030  TROPONINI <0.03 0.04* 0.06* 0.06*   BNP: BNP (last 3 results)  Recent Labs  03/31/15 2045  BNP 193.2*    ProBNP (last 3 results) No results for input(s): PROBNP in the last 8760 hours.  CBG: No results for input(s): GLUCAP in the last 168 hours.     SignedEulogio Bear  [facility] 04/02/2015, 5:09 PM

## 2015-04-02 NOTE — Telephone Encounter (Signed)
New message     Pt wife calling; pt is in hospital  Pt wife has questions regarding if pt has clots in legs   Pt wife states they were to do scan and now they have changed and said they would not be doing scans for clots Pt wife does not feel comfortable taking him home if he has clots

## 2015-04-02 NOTE — Evaluation (Signed)
Physical Therapy Evaluation Patient Details Name: Edward Woodward MRN: [id number] DOB: Sep 11, 1926 Today's Date: 04/02/2015   History of Present Illness  81 y.o. male admitted to [facility] on 03/31/15 for SOBThe pt was found to have PE.  Pt with significant PMHx of neuropathy, angina, lumbar spine degeneration, CAD, HTN, pacemaker, s/p CABG, and bil RTC repair.  Clinical Impression  Pt, at baseline, uses a cane and is now weaker with decreased activity tolerance, having to use a RW.  His O2 sats, despite 2/4 DOE with gait and HR remained stable on RA during gait.  He would benefit from HHPT to help progress his strength and endurance while a skilled practitioner monitors his O2 sats, HR, and BP response to activity.   PT to follow acutely for deficits listed below.       Follow Up Recommendations Home health PT;Supervision for mobility/OOB    Equipment Recommendations  None recommended by PT    Recommendations for Other Services   NA    Precautions / Restrictions Precautions Precautions: Fall      Mobility  Bed Mobility Overal bed mobility: Needs Assistance Bed Mobility: Supine to Sit     Supine to sit: Min assist     General bed mobility comments: Min hand held assist to help pt pull up to sitting.   Transfers Overall transfer level: Needs assistance Equipment used: Rolling walker (2 wheeled) Transfers: Sit to/from Stand Sit to Stand: Min assist         General transfer comment: Min assist to support trunk during transitions.  Verbal cues for safe hand placement.  Uncontrolled descent to sit.   Ambulation/Gait Ambulation/Gait assistance: Min assist Ambulation Distance (Feet): 180 Feet Assistive device: Rolling walker (2 wheeled) Gait Pattern/deviations: Step-through pattern;Shuffle;Trunk flexed Gait velocity: decreased   General Gait Details: Pt with slow gait pattern, DOE 2/4 at the end of gait, O2 sats remained 90% or higher on RA during gait.  HR controlled.           Balance Overall balance assessment: Needs assistance Sitting-balance support: Feet supported;No upper extremity supported Sitting balance-Leahy Scale: Good     Standing balance support: Bilateral upper extremity supported Standing balance-Leahy Scale: Poor                               Pertinent Vitals/Pain Pain Assessment: No/denies pain    Home Living Family/patient expects to be discharged to:: Private residence Living Arrangements: Spouse/significant other Available Help at Discharge: Family;Available 24 hours/day Type of Home: House Home Access: Stairs to enter   [employer] of Steps: 1 Home Layout: One level Home Equipment: Cane - single point;Walker - 4 wheels;Shower seat Additional Comments: Doesn't wear O2 at home    Prior Function Level of Independence: Independent with assistive device(s)               Hand Dominance   Dominant Hand: Right    Extremity/Trunk Assessment   Upper Extremity Assessment: Overall WFL for tasks assessed           Lower Extremity Assessment: Generalized weakness      Cervical / Trunk Assessment: Normal  Communication   Communication: HOH  Cognition Arousal/Alertness: Awake/alert Behavior During Therapy: WFL for tasks assessed/performed Overall Cognitive Status: Within Functional Limits for tasks assessed  Assessment/Plan    PT Assessment Patient needs continued PT services  PT Diagnosis Difficulty walking;Abnormality of gait;Generalized weakness   PT Problem List Decreased strength;Decreased activity tolerance;Decreased balance;Decreased mobility;Decreased knowledge of use of DME;Cardiopulmonary status limiting activity  PT Treatment Interventions DME instruction;Gait training;Functional mobility training;Stair training;Therapeutic activities;Therapeutic exercise;Balance training;Neuromuscular re-education;Patient/family education   PT Goals  (Current goals can be found in the Care Plan section) Acute Rehab PT Goals Patient Stated Goal: to go home PT Goal Formulation: With patient Time For Goal Achievement: 04/16/15 Potential to Achieve Goals: Good    Frequency Min 3X/week        End of Session Equipment Utilized During Treatment: Gait belt Activity Tolerance: Patient limited by fatigue Patient left: in chair;with call bell/phone within reach;with family/visitor present Nurse Communication: Mobility status        Time: 8337-4451 PT Time Calculation (min) (ACUTE ONLY): 38 min   Charges:   PT Evaluation $Initial PT Evaluation Tier I: 1 Procedure PT Treatments $Gait Training: 8-22 mins $Therapeutic Activity: 8-22 mins        Sophee Mckimmy B. Trumbull, [place], DPT [phone]   04/02/2015, 5:02 PM

## 2015-04-02 NOTE — Discharge Instructions (Signed)
Information on my medicine - XARELTO (rivaroxaban)  This medication education was reviewed with me or my healthcare representative as part of my discharge preparation.  The pharmacist that spoke with me during my hospital stay was:  Darryel Diodato, [place], [place]? Xarelto was prescribed to treat blood clots that may have been found in the veins of your legs (deep vein thrombosis) or in your lungs (pulmonary embolism) and to reduce the risk of them occurring again.  What do you need to know about Xarelto? The starting dose is one 15 mg tablet taken TWICE daily with food for the FIRST 21 DAYS then on 04/23/15  the dose is changed to one 20 mg tablet taken ONCE A DAY with your evening meal.  DO NOT stop taking Xarelto without talking to the health care provider who prescribed the medication.  Refill your prescription for 20 mg tablets before you run out.  After discharge, you should have regular check-up appointments with your healthcare provider that is prescribing your Xarelto.  In the future your dose may need to be changed if your kidney function changes by a significant amount.  What do you do if you miss a dose? If you are taking Xarelto TWICE DAILY and you miss a dose, take it as soon as you remember. You may take two 15 mg tablets (total 30 mg) at the same time then resume your regularly scheduled 15 mg twice daily the next day.  If you are taking Xarelto ONCE DAILY and you miss a dose, take it as soon as you remember on the same day then continue your regularly scheduled once daily regimen the next day. Do not take two doses of Xarelto at the same time.   Important Safety Information Xarelto is a blood thinner medicine that can cause bleeding. You should call your healthcare provider right away if you experience any of the following: ? Bleeding from an injury or your nose that does not stop. ? Unusual colored urine (red or dark brown) or  unusual colored stools (red or black). ? Unusual bruising for unknown reasons. ? A serious fall or if you hit your head (even if there is no bleeding).  Some medicines may interact with Xarelto and might increase your risk of bleeding while on Xarelto. To help avoid this, consult your healthcare provider or pharmacist prior to using any new prescription or non-prescription medications, including herbals, vitamins, non-steroidal anti-inflammatory drugs (NSAIDs) and supplements.  This website has more information on Xarelto: [email or web address]

## 2015-04-03 DIAGNOSIS — N183 Chronic kidney disease, stage 3 (moderate): Secondary | ICD-10-CM | POA: Diagnosis not present

## 2015-04-03 DIAGNOSIS — I2510 Atherosclerotic heart disease of native coronary artery without angina pectoris: Secondary | ICD-10-CM | POA: Diagnosis not present

## 2015-04-03 DIAGNOSIS — I2699 Other pulmonary embolism without acute cor pulmonale: Secondary | ICD-10-CM | POA: Diagnosis not present

## 2015-04-03 DIAGNOSIS — I129 Hypertensive chronic kidney disease with stage 1 through stage 4 chronic kidney disease, or unspecified chronic kidney disease: Secondary | ICD-10-CM | POA: Diagnosis not present

## 2015-04-03 DIAGNOSIS — I739 Peripheral vascular disease, unspecified: Secondary | ICD-10-CM | POA: Diagnosis not present

## 2015-04-03 DIAGNOSIS — Z8572 Personal history of non-Hodgkin lymphomas: Secondary | ICD-10-CM | POA: Diagnosis not present

## 2015-04-03 DIAGNOSIS — I4891 Unspecified atrial fibrillation: Secondary | ICD-10-CM | POA: Diagnosis not present

## 2015-04-03 DIAGNOSIS — G629 Polyneuropathy, unspecified: Secondary | ICD-10-CM | POA: Diagnosis not present

## 2015-04-03 DIAGNOSIS — F329 Major depressive disorder, single episode, unspecified: Secondary | ICD-10-CM | POA: Diagnosis not present

## 2015-04-03 DIAGNOSIS — Z950 Presence of cardiac pacemaker: Secondary | ICD-10-CM | POA: Diagnosis not present

## 2015-04-03 DIAGNOSIS — Z7901 Long term (current) use of anticoagulants: Secondary | ICD-10-CM | POA: Diagnosis not present

## 2015-04-03 DIAGNOSIS — Z951 Presence of aortocoronary bypass graft: Secondary | ICD-10-CM | POA: Diagnosis not present

## 2015-04-03 DIAGNOSIS — Z8546 Personal history of malignant neoplasm of prostate: Secondary | ICD-10-CM | POA: Diagnosis not present

## 2015-04-03 DIAGNOSIS — G4733 Obstructive sleep apnea (adult) (pediatric): Secondary | ICD-10-CM | POA: Diagnosis not present

## 2015-04-03 DIAGNOSIS — I495 Sick sinus syndrome: Secondary | ICD-10-CM | POA: Diagnosis not present

## 2015-04-06 ENCOUNTER — Encounter: Payer: Self-pay | Admitting: Oncology

## 2015-04-08 ENCOUNTER — Ambulatory Visit (INDEPENDENT_AMBULATORY_CARE_PROVIDER_SITE_OTHER): Payer: Medicare Other | Admitting: *Deleted

## 2015-04-08 DIAGNOSIS — I495 Sick sinus syndrome: Secondary | ICD-10-CM | POA: Diagnosis not present

## 2015-04-08 LAB — CUP PACEART INCLINIC DEVICE CHECK
Brady Statistic RV Percent Paced: 5 %
Date Time Interrogation Session: 20160912040000
Lead Channel Impedance Value: 470 Ohm
Lead Channel Impedance Value: 580 Ohm
Lead Channel Pacing Threshold Amplitude: 0.5 V
Lead Channel Pacing Threshold Pulse Width: 0.4 ms
Lead Channel Sensing Intrinsic Amplitude: 1.8 mV
Lead Channel Setting Pacing Amplitude: 2 V
MDC IDC MSMT LEADCHNL RV PACING THRESHOLD AMPLITUDE: 1 V
MDC IDC MSMT LEADCHNL RV PACING THRESHOLD PULSEWIDTH: 0.4 ms
MDC IDC MSMT LEADCHNL RV SENSING INTR AMPL: 12 mV — AB
MDC IDC PG SERIAL: 317986
MDC IDC SET LEADCHNL RV PACING AMPLITUDE: 2.4 V
MDC IDC SET LEADCHNL RV PACING PULSEWIDTH: 0.8 ms
MDC IDC SET LEADCHNL RV SENSING SENSITIVITY: 2.5 mV
MDC IDC STAT BRADY RA PERCENT PACED: 97 %

## 2015-04-08 NOTE — Progress Notes (Signed)
Pacemaker check in clinic. Normal device function. Thresholds, sensing, impedances consistent with previous measurements. Device programmed to maximize longevity. 2 ATR episodes- longest 1:38, pk A 380bpm +xarelto. No high ventricular rates noted. Device programmed at appropriate safety margins. Histogram distribution appropriate for patient activity level. Device programmed to optimize intrinsic conduction. Estimated longevity 2 years. ROV with SK in February.

## 2015-04-09 ENCOUNTER — Telehealth: Payer: Self-pay | Admitting: Oncology

## 2015-04-09 DIAGNOSIS — I129 Hypertensive chronic kidney disease with stage 1 through stage 4 chronic kidney disease, or unspecified chronic kidney disease: Secondary | ICD-10-CM | POA: Diagnosis not present

## 2015-04-09 DIAGNOSIS — I495 Sick sinus syndrome: Secondary | ICD-10-CM | POA: Diagnosis not present

## 2015-04-09 DIAGNOSIS — N183 Chronic kidney disease, stage 3 (moderate): Secondary | ICD-10-CM | POA: Diagnosis not present

## 2015-04-09 DIAGNOSIS — I739 Peripheral vascular disease, unspecified: Secondary | ICD-10-CM | POA: Diagnosis not present

## 2015-04-09 DIAGNOSIS — I2699 Other pulmonary embolism without acute cor pulmonale: Secondary | ICD-10-CM | POA: Diagnosis not present

## 2015-04-09 DIAGNOSIS — I4891 Unspecified atrial fibrillation: Secondary | ICD-10-CM | POA: Diagnosis not present

## 2015-04-09 NOTE — Telephone Encounter (Signed)
Pt called to r/s due to hospital f/u, pt confirmed new D/T... KJ

## 2015-04-11 DIAGNOSIS — I739 Peripheral vascular disease, unspecified: Secondary | ICD-10-CM | POA: Diagnosis not present

## 2015-04-11 DIAGNOSIS — N183 Chronic kidney disease, stage 3 (moderate): Secondary | ICD-10-CM | POA: Diagnosis not present

## 2015-04-11 DIAGNOSIS — I4891 Unspecified atrial fibrillation: Secondary | ICD-10-CM | POA: Diagnosis not present

## 2015-04-11 DIAGNOSIS — I129 Hypertensive chronic kidney disease with stage 1 through stage 4 chronic kidney disease, or unspecified chronic kidney disease: Secondary | ICD-10-CM | POA: Diagnosis not present

## 2015-04-11 DIAGNOSIS — I2699 Other pulmonary embolism without acute cor pulmonale: Secondary | ICD-10-CM | POA: Diagnosis not present

## 2015-04-11 DIAGNOSIS — I495 Sick sinus syndrome: Secondary | ICD-10-CM | POA: Diagnosis not present

## 2015-04-15 ENCOUNTER — Emergency Department (HOSPITAL_BASED_OUTPATIENT_CLINIC_OR_DEPARTMENT_OTHER): Payer: Medicare Other

## 2015-04-15 ENCOUNTER — Encounter (HOSPITAL_BASED_OUTPATIENT_CLINIC_OR_DEPARTMENT_OTHER): Payer: Self-pay | Admitting: Emergency Medicine

## 2015-04-15 ENCOUNTER — Emergency Department (HOSPITAL_BASED_OUTPATIENT_CLINIC_OR_DEPARTMENT_OTHER)
Admission: EM | Admit: 2015-04-15 | Discharge: 2015-04-15 | Disposition: A | Discharge status: Home / Self Care | Payer: Medicare Other | Attending: Emergency Medicine | Admitting: Emergency Medicine

## 2015-04-15 DIAGNOSIS — Z86711 Personal history of pulmonary embolism: Secondary | ICD-10-CM | POA: Insufficient documentation

## 2015-04-15 DIAGNOSIS — Y998 Other external cause status: Secondary | ICD-10-CM | POA: Diagnosis not present

## 2015-04-15 DIAGNOSIS — Z79899 Other long term (current) drug therapy: Secondary | ICD-10-CM | POA: Insufficient documentation

## 2015-04-15 DIAGNOSIS — Z8546 Personal history of malignant neoplasm of prostate: Secondary | ICD-10-CM | POA: Diagnosis not present

## 2015-04-15 DIAGNOSIS — Y9289 Other specified places as the place of occurrence of the external cause: Secondary | ICD-10-CM | POA: Insufficient documentation

## 2015-04-15 DIAGNOSIS — I25119 Atherosclerotic heart disease of native coronary artery with unspecified angina pectoris: Secondary | ICD-10-CM | POA: Insufficient documentation

## 2015-04-15 DIAGNOSIS — Z950 Presence of cardiac pacemaker: Secondary | ICD-10-CM | POA: Diagnosis not present

## 2015-04-15 DIAGNOSIS — I10 Essential (primary) hypertension: Secondary | ICD-10-CM | POA: Insufficient documentation

## 2015-04-15 DIAGNOSIS — G4733 Obstructive sleep apnea (adult) (pediatric): Secondary | ICD-10-CM | POA: Insufficient documentation

## 2015-04-15 DIAGNOSIS — G629 Polyneuropathy, unspecified: Secondary | ICD-10-CM | POA: Insufficient documentation

## 2015-04-15 DIAGNOSIS — S199XXA Unspecified injury of neck, initial encounter: Secondary | ICD-10-CM | POA: Insufficient documentation

## 2015-04-15 DIAGNOSIS — E780 Pure hypercholesterolemia: Secondary | ICD-10-CM | POA: Diagnosis not present

## 2015-04-15 DIAGNOSIS — Y9389 Activity, other specified: Secondary | ICD-10-CM | POA: Insufficient documentation

## 2015-04-15 DIAGNOSIS — Z9981 Dependence on supplemental oxygen: Secondary | ICD-10-CM | POA: Diagnosis not present

## 2015-04-15 DIAGNOSIS — W19XXXA Unspecified fall, initial encounter: Secondary | ICD-10-CM

## 2015-04-15 DIAGNOSIS — Z8719 Personal history of other diseases of the digestive system: Secondary | ICD-10-CM | POA: Insufficient documentation

## 2015-04-15 DIAGNOSIS — R2689 Other abnormalities of gait and mobility: Secondary | ICD-10-CM | POA: Diagnosis not present

## 2015-04-15 DIAGNOSIS — F329 Major depressive disorder, single episode, unspecified: Secondary | ICD-10-CM | POA: Diagnosis not present

## 2015-04-15 DIAGNOSIS — S0990XA Unspecified injury of head, initial encounter: Secondary | ICD-10-CM | POA: Diagnosis not present

## 2015-04-15 DIAGNOSIS — W01198A Fall on same level from slipping, tripping and stumbling with subsequent striking against other object, initial encounter: Secondary | ICD-10-CM | POA: Insufficient documentation

## 2015-04-15 DIAGNOSIS — Z880 Allergy status to penicillin: Secondary | ICD-10-CM | POA: Diagnosis not present

## 2015-04-15 NOTE — ED Provider Notes (Signed)
CSN: [id number]     Arrival date & time 04/15/15  1735 History  This chart was scribed for Malvin Johns, MD by Meriel Pica, ED Scribe. This patient was seen in room MH12/MH12 and the patient's care was started 6:44 PM.   Chief Complaint  Patient presents with  . Head Injury   The history is provided by the patient and a relative. No language interpreter was used.   HPI Comments: Edward Woodward is a 81 y.o. male, with a significant PMhx, who presents to the Emergency Department complaining of a head injury s/p fall that occurred PTA. Pt reports he lost his balance and fell backwards and hit the back of his head on a wall. He ambulates with a walker at baseline. The pt was admitted 15 days ago for PE and discharged 2 days after admittance date (03/31/15) with prescription for Xarelto started pack; started Xarelto on 9/5. Pt reports pain with ROM of neck at baseline. He denies feeling dizzy or light-headed prior to the fall, LOC, headache, new or worsening neck pain, or any other arthralgias or wounds s/p fall.   Past Medical History  Diagnosis Date  . Cancer   . Prostate cancer   . Neuropathy   . Hypercholesteremia   . Depressive disorder, not elsewhere classified   . Angina pectoris   . Sinoatrial node dysfunction   . Degeneration of lumbar or lumbosacral intervertebral disc   . Dyspnea   . CAD (coronary artery disease)      NYHA Class 1B, CABG 1985  . HTN (hypertension)   . OSA on CPAP   . Collagen vascular disease   . Complication of anesthesia     " I get confused afterwords  "  . PE (pulmonary embolism) 03/2015  . Neuromuscular disorder     neuropathy  . GERD (gastroesophageal reflux disease)   . Presence of permanent cardiac pacemaker    Past Surgical History  Procedure Laterality Date  . Coronary artery bypass graft    . Exploratory laparotomy    . Endarterectomy    . Rotator cuff repair    . Lumbar fusion    . Coronary angioplasty with stent placement      s/p  bare metal stent implant SVG-diagonal 11/23/05, s/p BM stent implantation SVG diagonal 10/22/06 (feeds LAD), and 05/2010 with DES to left circumflex  . Pacemaker insertion     Family History  Problem Relation Age of Onset  . Heart disease Father    Social History  Substance Use Topics  . Smoking status: Never Smoker   . Smokeless tobacco: Never Used  . Alcohol Use: No    Review of Systems  Constitutional: Negative for fever, chills, diaphoresis and fatigue.  HENT: Negative for congestion, rhinorrhea and sneezing.   Eyes: Negative.   Respiratory: Negative for cough, chest tightness and shortness of breath.   Cardiovascular: Negative for chest pain and leg swelling.  Gastrointestinal: Negative for nausea, vomiting, abdominal pain, diarrhea and blood in stool.  Genitourinary: Negative for frequency, hematuria, flank pain and difficulty urinating.  Musculoskeletal: Positive for neck pain. Negative for back pain and arthralgias.  Skin: Negative for rash.  Neurological: Negative for dizziness, speech difficulty, weakness, numbness and headaches.   Allergies  Penicillins and Simvastatin  Home Medications   Prior to Admission medications   Medication Sig Start Date End Date Taking? Authorizing Provider  atorvastatin (LIPITOR) 20 MG tablet Take 20 mg by mouth every other day.    Historical Provider,  MD  b complex vitamins tablet Take 1 tablet by mouth daily.      Historical Provider, MD  Cholecalciferol (VITAMIN D3) 2000 UNITS TABS Take 2 tablets by mouth daily.     Historical Provider, MD  citalopram (CELEXA) 20 MG tablet Take 20 mg by mouth daily.      Historical Provider, MD  furosemide (LASIX) 40 MG tablet TAKE TWO TABLETS BY MOUTH ONCE DAILY 12/17/14   Belva Crome, MD  gabapentin (NEURONTIN) 300 MG capsule Take 300 mg by mouth 2 (two) times daily. 2 times daily w/ additional dose PRN, not to exceed TID    Historical Provider, MD  Melatonin 3 MG TABS Take 3 mg by mouth at bedtime.     Historical Provider, MD  metoprolol succinate (TOPROL-XL) 25 MG 24 hr tablet TAKE ONE TABLET BY MOUTH ONCE DAILY 02/13/15   Dayna N Dunn, PA-C  Multiple Vitamin (MULTIVITAMIN) capsule Take 1 capsule by mouth daily.    Historical Provider, MD  nitroGLYCERIN (NITROLINGUAL) 0.4 MG/SPRAY spray Place 1 spray under the tongue every 5 (five) minutes x 3 doses as needed for chest pain. 04/04/14   Belva Crome, MD  OMEGA-3 KRILL OIL PO Take 2 capsules by mouth daily.    Historical Provider, MD  Polyethyl Glycol-Propyl Glycol (SYSTANE) 0.4-0.3 % SOLN Apply 1 drop to eye at bedtime.    Historical Provider, MD  Rivaroxaban (XARELTO STARTER PACK) 15 & 20 MG TBPK Take as directed on package: Start with one 15mg  tablet by mouth twice a day with food. On Day 22, switch to one 20mg  tablet once a day with food. 04/02/15   Geradine Girt, DO  UBIQUINOL PO Take 1 tablet by mouth daily. CoQ10    Historical Provider, MD  zolpidem (AMBIEN) 5 MG tablet Take 5 mg by mouth at bedtime as needed for sleep.  02/28/15   Historical Provider, MD   BP 132/57 mmHg  Pulse 64  Temp(Src) 98.4 F (36.9 C) (Oral)  Resp 18  Ht 5\' 10"  (1.778 m)  Wt 202 lb (91.627 kg)  BMI 28.98 kg/m2  SpO2 100% Physical Exam  Constitutional: He is oriented to person, place, and time. He appears well-developed and well-nourished.  HENT:  Head: Normocephalic and atraumatic.  Eyes: Pupils are equal, round, and reactive to light.  Neck: Normal range of motion. Neck supple.  No pain on palpation of the cervical spine although he has some minor discomfort on range of motion of his neck. There is no pain to the thoracic or lumbosacral spine.  Cardiovascular: Normal rate, regular rhythm and normal heart sounds.   Pulmonary/Chest: Effort normal and breath sounds normal. No respiratory distress. He has no wheezes. He has no rales. He exhibits no tenderness.  Abdominal: Soft. Bowel sounds are normal. There is no tenderness. There is no rebound and no guarding.   Musculoskeletal: Normal range of motion. He exhibits no edema.  No pain on palpation or range of motion extremities  Lymphadenopathy:    He has no cervical adenopathy.  Neurological: He is alert and oriented to person, place, and time.  Skin: Skin is warm and dry. No rash noted.  Psychiatric: He has a normal mood and affect.    ED Course  Procedures  DIAGNOSTIC STUDIES: Oxygen Saturation is 100% on RA, normal by my interpretation.    COORDINATION OF CARE: 6:47 PM Discussed treatment plan with pt at bedside and pt agreed to plan.   Labs Review Labs Reviewed - No  data to display  Imaging Review Ct Head Wo Contrast  04/15/2015   CLINICAL DATA:  81 year old male with acute fall and head injury today. Currently on Xarelto.  EXAM: CT HEAD WITHOUT CONTRAST  TECHNIQUE: Contiguous axial images were obtained from the base of the skull through the vertex without intravenous contrast.  COMPARISON:  10/14/2012  FINDINGS: Atrophy and chronic small-vessel white matter ischemic changes are again noted.  No acute intracranial abnormalities are identified, including mass lesion or mass effect, hydrocephalus, extra-axial fluid collection, midline shift, hemorrhage, or acute infarction.  The visualized bony calvarium is unremarkable.  IMPRESSION: No evidence of acute intracranial abnormality.  Atrophy and chronic small-vessel white matter ischemic changes.   Electronically Signed   By: Margarette Canada M.D.   On: 04/15/2015 19:08   Ct Cervical Spine Wo Contrast  04/15/2015   CLINICAL DATA:  81 year old who lost his balance and fell backwards earlier today while at home, striking the back of his head on a wall. Initial encounter.  EXAM: CT CERVICAL SPINE WITHOUT CONTRAST  TECHNIQUE: Multidetector CT imaging of the cervical spine was performed without intravenous contrast. Multiplanar CT image reconstructions were also generated.  COMPARISON:  Cervical spine x-rays 10/14/2012.  FINDINGS: No acute fractures  identified involving the cervical spine. Sagittal reconstructed images demonstrate anatomic posterior alignment. Facet joints intact throughout with severe diffuse degenerative changes. Sagittal images demonstrate disc space narrowing and endplate hypertrophic changes at every cervical level, greatest at C5-6 where there is mild multifactorial spinal stenosis. Coronal images demonstrate an intact craniocervical junction, intact dens and intact lateral masses. Degenerative changes at the C1-C2 articulation. Combination of facet and uncinate hypertrophy account for multilevel foraminal stenoses including severe bilateral C2-3, severe bilateral C3-4, severe bilateral C4-5, severe right and moderate left C5-6, severe bilateral C6-7 and mild left C7-T1.  IMPRESSION: 1. No acute fractures identified involving the cervical spine. 2. Multilevel degenerative disc disease, spondylosis, and facet degenerative changes resulting in multilevel foraminal stenoses as detailed above. Mild multifactorial spinal stenosis at C5-6.   Electronically Signed   By: Evangeline Dakin M.D.   On: 04/15/2015 19:57   I have personally reviewed and evaluated these images and lab results as part of my medical decision-making.   EKG Interpretation None      MDM   Final diagnoses:  Fall, initial encounter  Head injury, initial encounter    Patient presents after a mechanical fall. He had no symptoms preceding the fall. He denies any pain although he did have some pain on range of motion of his neck although he says he has chronic neck issues. There is no evidence of cervical fracture or intracranial hemorrhage. He was discharged home in good condition. He was given head injury precautions. He is advised to return as needed for any worsening symptoms or follow up with his PCP as needed.  I personally performed the services described in this documentation, which was scribed in my presence.  The recorded information has been reviewed  and considered.    Malvin Johns, MD 04/15/15 2002

## 2015-04-15 NOTE — ED Notes (Signed)
Patient started on xarelto about 1 week ago. The patient fell backwards and hit head at home.

## 2015-04-15 NOTE — ED Notes (Signed)
Patient transported to CT 

## 2015-04-15 NOTE — ED Notes (Signed)
Family has a WNL neuro check, equal reactive pupils and equal grips. Family denies any LOC or confusion post fall

## 2015-04-15 NOTE — Discharge Instructions (Signed)

## 2015-04-17 ENCOUNTER — Other Ambulatory Visit: Payer: Self-pay | Admitting: Geriatric Medicine

## 2015-04-17 ENCOUNTER — Other Ambulatory Visit: Payer: Self-pay | Admitting: *Deleted

## 2015-04-17 ENCOUNTER — Ambulatory Visit
Admission: RE | Admit: 2015-04-17 | Discharge: 2015-04-17 | Disposition: A | Discharge status: Home / Self Care | Payer: Medicare Other | Source: Ambulatory Visit | Attending: Geriatric Medicine | Admitting: Geriatric Medicine

## 2015-04-17 DIAGNOSIS — I129 Hypertensive chronic kidney disease with stage 1 through stage 4 chronic kidney disease, or unspecified chronic kidney disease: Secondary | ICD-10-CM | POA: Diagnosis not present

## 2015-04-17 DIAGNOSIS — N281 Cyst of kidney, acquired: Secondary | ICD-10-CM

## 2015-04-17 DIAGNOSIS — N183 Chronic kidney disease, stage 3 (moderate): Secondary | ICD-10-CM | POA: Diagnosis not present

## 2015-04-17 DIAGNOSIS — Z79899 Other long term (current) drug therapy: Secondary | ICD-10-CM | POA: Diagnosis not present

## 2015-04-17 DIAGNOSIS — Z23 Encounter for immunization: Secondary | ICD-10-CM | POA: Diagnosis not present

## 2015-04-17 DIAGNOSIS — I2699 Other pulmonary embolism without acute cor pulmonale: Secondary | ICD-10-CM | POA: Diagnosis not present

## 2015-04-17 NOTE — Telephone Encounter (Signed)
Per Dr Benay Spice, pt to continue Xarelto and keep 9/30 appt as scheduled. Both daughter, [name], and [name], wife, made aware of these instructions, [name] via telephone and [name] via [place].

## 2015-04-19 DIAGNOSIS — I4891 Unspecified atrial fibrillation: Secondary | ICD-10-CM | POA: Diagnosis not present

## 2015-04-19 DIAGNOSIS — I739 Peripheral vascular disease, unspecified: Secondary | ICD-10-CM | POA: Diagnosis not present

## 2015-04-19 DIAGNOSIS — N183 Chronic kidney disease, stage 3 (moderate): Secondary | ICD-10-CM | POA: Diagnosis not present

## 2015-04-19 DIAGNOSIS — I2699 Other pulmonary embolism without acute cor pulmonale: Secondary | ICD-10-CM | POA: Diagnosis not present

## 2015-04-19 DIAGNOSIS — I129 Hypertensive chronic kidney disease with stage 1 through stage 4 chronic kidney disease, or unspecified chronic kidney disease: Secondary | ICD-10-CM | POA: Diagnosis not present

## 2015-04-19 DIAGNOSIS — I495 Sick sinus syndrome: Secondary | ICD-10-CM | POA: Diagnosis not present

## 2015-04-22 DIAGNOSIS — I2699 Other pulmonary embolism without acute cor pulmonale: Secondary | ICD-10-CM | POA: Diagnosis not present

## 2015-04-22 DIAGNOSIS — I739 Peripheral vascular disease, unspecified: Secondary | ICD-10-CM | POA: Diagnosis not present

## 2015-04-22 DIAGNOSIS — N183 Chronic kidney disease, stage 3 (moderate): Secondary | ICD-10-CM | POA: Diagnosis not present

## 2015-04-22 DIAGNOSIS — I129 Hypertensive chronic kidney disease with stage 1 through stage 4 chronic kidney disease, or unspecified chronic kidney disease: Secondary | ICD-10-CM | POA: Diagnosis not present

## 2015-04-22 DIAGNOSIS — I495 Sick sinus syndrome: Secondary | ICD-10-CM | POA: Diagnosis not present

## 2015-04-22 DIAGNOSIS — I4891 Unspecified atrial fibrillation: Secondary | ICD-10-CM | POA: Diagnosis not present

## 2015-04-23 DIAGNOSIS — H35353 Cystoid macular degeneration, bilateral: Secondary | ICD-10-CM | POA: Diagnosis not present

## 2015-04-23 DIAGNOSIS — H3532 Exudative age-related macular degeneration: Secondary | ICD-10-CM | POA: Diagnosis not present

## 2015-04-23 DIAGNOSIS — Z961 Presence of intraocular lens: Secondary | ICD-10-CM | POA: Diagnosis not present

## 2015-04-23 DIAGNOSIS — H43813 Vitreous degeneration, bilateral: Secondary | ICD-10-CM | POA: Diagnosis not present

## 2015-04-25 DIAGNOSIS — I2699 Other pulmonary embolism without acute cor pulmonale: Secondary | ICD-10-CM | POA: Diagnosis not present

## 2015-04-25 DIAGNOSIS — I739 Peripheral vascular disease, unspecified: Secondary | ICD-10-CM | POA: Diagnosis not present

## 2015-04-25 DIAGNOSIS — I495 Sick sinus syndrome: Secondary | ICD-10-CM | POA: Diagnosis not present

## 2015-04-25 DIAGNOSIS — N183 Chronic kidney disease, stage 3 (moderate): Secondary | ICD-10-CM | POA: Diagnosis not present

## 2015-04-25 DIAGNOSIS — I129 Hypertensive chronic kidney disease with stage 1 through stage 4 chronic kidney disease, or unspecified chronic kidney disease: Secondary | ICD-10-CM | POA: Diagnosis not present

## 2015-04-25 DIAGNOSIS — I4891 Unspecified atrial fibrillation: Secondary | ICD-10-CM | POA: Diagnosis not present

## 2015-04-26 ENCOUNTER — Ambulatory Visit (HOSPITAL_BASED_OUTPATIENT_CLINIC_OR_DEPARTMENT_OTHER): Payer: Medicare Other | Admitting: Oncology

## 2015-04-26 ENCOUNTER — Other Ambulatory Visit (HOSPITAL_BASED_OUTPATIENT_CLINIC_OR_DEPARTMENT_OTHER): Payer: Medicare Other

## 2015-04-26 ENCOUNTER — Ambulatory Visit (HOSPITAL_BASED_OUTPATIENT_CLINIC_OR_DEPARTMENT_OTHER): Payer: Medicare Other

## 2015-04-26 ENCOUNTER — Telehealth: Payer: Self-pay | Admitting: Oncology

## 2015-04-26 VITALS — BP 131/66 | HR 65 | Temp 97.9°F | Resp 17 | Ht 70.0 in | Wt 212.4 lb

## 2015-04-26 DIAGNOSIS — D649 Anemia, unspecified: Secondary | ICD-10-CM

## 2015-04-26 DIAGNOSIS — M1611 Unilateral primary osteoarthritis, right hip: Secondary | ICD-10-CM

## 2015-04-26 DIAGNOSIS — Z8572 Personal history of non-Hodgkin lymphomas: Secondary | ICD-10-CM

## 2015-04-26 DIAGNOSIS — N289 Disorder of kidney and ureter, unspecified: Secondary | ICD-10-CM

## 2015-04-26 DIAGNOSIS — C61 Malignant neoplasm of prostate: Secondary | ICD-10-CM

## 2015-04-26 DIAGNOSIS — C8590 Non-Hodgkin lymphoma, unspecified, unspecified site: Secondary | ICD-10-CM

## 2015-04-26 DIAGNOSIS — I2699 Other pulmonary embolism without acute cor pulmonale: Secondary | ICD-10-CM

## 2015-04-26 DIAGNOSIS — C8589 Other specified types of non-Hodgkin lymphoma, extranodal and solid organ sites: Secondary | ICD-10-CM | POA: Diagnosis not present

## 2015-04-26 DIAGNOSIS — I82402 Acute embolism and thrombosis of unspecified deep veins of left lower extremity: Secondary | ICD-10-CM

## 2015-04-26 LAB — BASIC METABOLIC PANEL (CC13)
ANION GAP: 8 meq/L (ref 3–11)
BUN: 27.6 mg/dL — AB (ref 7.0–26.0)
CALCIUM: 11.8 mg/dL — AB (ref 8.4–10.4)
CO2: 29 mEq/L (ref 22–29)
Chloride: 104 mEq/L (ref 98–109)
Creatinine: 1.6 mg/dL — ABNORMAL HIGH (ref 0.7–1.3)
EGFR: 38 mL/min/{1.73_m2} — ABNORMAL LOW (ref >90)
GLUCOSE: 112 mg/dL (ref 70–140)
POTASSIUM: 4.1 meq/L (ref 3.5–5.1)
Sodium: 141 mEq/L (ref 136–145)

## 2015-04-26 LAB — CBC WITH DIFFERENTIAL/PLATELET
BASO%: 0.2 % (ref 0.0–2.0)
BASOS ABS: 0 10*3/uL (ref 0.0–0.1)
EOS ABS: 0.2 10*3/uL (ref 0.0–0.5)
EOS%: 3.2 % (ref 0.0–7.0)
HEMATOCRIT: 33.9 % — AB (ref 38.4–49.9)
HGB: 11.3 g/dL — ABNORMAL LOW (ref 13.0–17.1)
LYMPH#: 1.7 10*3/uL (ref 0.9–3.3)
LYMPH%: 26.8 % (ref 14.0–49.0)
MCH: 30.5 pg (ref 27.2–33.4)
MCHC: 33.3 g/dL (ref 32.0–36.0)
MCV: 91.4 fL (ref 79.3–98.0)
MONO#: 0.7 10*3/uL (ref 0.1–0.9)
MONO%: 10.7 % (ref 0.0–14.0)
NEUT#: 3.7 10*3/uL (ref 1.5–6.5)
NEUT%: 59.1 % (ref 39.0–75.0)
PLATELETS: 114 10*3/uL — AB (ref 140–400)
RBC: 3.71 10*6/uL — ABNORMAL LOW (ref 4.20–5.82)
RDW: 14.2 % (ref 11.0–14.6)
WBC: 6.2 10*3/uL (ref 4.0–10.3)

## 2015-04-26 MED ORDER — ZOLEDRONIC ACID 4 MG/100ML IV SOLN
4.0000 mg | Freq: Once | INTRAVENOUS | Status: AC
  Administered 2015-04-26: 4 mg via INTRAVENOUS
  Filled 2015-04-26: qty 100

## 2015-04-26 NOTE — Telephone Encounter (Signed)
Gave and pritned appt sched and avs fo rpt for OCT....gv barium

## 2015-04-26 NOTE — Progress Notes (Signed)
[facility] OFFICE PROGRESS NOTE   Diagnosis: Non-Hodgkin's,, prostate cancer  INTERVAL HISTORY:   Mr. Kitt returns prior to a scheduled visit. He was admitted 04/01/2015 with dyspnea. He was diagnosed with a pulmonary embolism and left lower extremities DVT. He is now maintained on Xarelto. The dyspnea has improved. Mr. Barfoot has increased generalized weakness, anorexia, and limited activity since discharge from the hospital. He complains of left leg pain and weakness.  He was noted to have a mildly elevated calcium at 10.8 when he saw Dr. Felipa Eth on 04/17/2015.  Objective:  Vital signs in last 24 hours:  Blood pressure 131/66, pulse 65, temperature 97.9 F (36.6 C), temperature source Oral, resp. rate 17, height 5\' 10"  (1.778 m), weight 212 lb 6.4 oz (96.344 kg), SpO2 96 %.    HEENT: Neck without mass Lymphatics: No cervical, supraclavicular, axillary, or inguinal nodes Resp: Lungs clear bilaterally Cardio: Regular rate and rhythm GI: No hepatosplenomegaly, no mass Vascular: Trace edema at the left lower leg Neuro: Alert and oriented, follows commands      Lab Results:  Lab Results  Component Value Date   WBC 6.2 04/26/2015   HGB 11.3* 04/26/2015   HCT 33.9* 04/26/2015   MCV 91.4 04/26/2015   PLT 114* 04/26/2015   NEUTROABS 3.7 04/26/2015   Potassium 4.1, BUN 27.6, creatinine 1.6, calcium 11.8   Medications: I have reviewed the patient's current medications.  Assessment/Plan: 1. Non-Hodgkin's lymphoma (follicular grade 3 lymphoma) - status post 6 cycles of Cytoxan/prednisone/rituximab 07/01/2007 through 10/21/2007. He remains in clinical remission. 2. Prostate cancer - he has been treated with hormonal therapy, the PSA was stable on 10/15/2014  3. History of a normocytic anemia secondary to non-Hodgkin's lymphoma, chemotherapy, and renal insufficiency - stable  4. History of Mild thrombocytopenia  5. History of bilateral hydronephrosis.  Renal ultrasound 09/26/2014 consistent with medical renal disease. No hydronephrosis. 6. Coronary artery disease - status post coronary artery stent placement. 7. History of noncalcified lung nodules. 8. History of severe neutropenia July 2009 - likely related to delayed toxicity from rituximab. 9. Macular degeneration. 10. Right middle lobe density on a CT of the chest 06/18/2010 measuring 2.5 x 1.6 cm with mildly increased metabolic activity (SUV max 2.5) on a PET scan 06/27/2010. The area of increased density was less confluent and appeared smaller on a restaging CT 09/08/2010. Right middle lobe "scarring "with no new or suspicious pulmonary nodule on a CT 08/04/2011. 11. Mild increased metabolic activity associated with several lymph nodes on the PET scan 06/27/2010 - likely related to lymphoma. No lymphadenopathy in the mediastinum or axillary areas on the CT 08/04/2011. 12. Right hip discomfort-he received a steroid injection by his primary physician without improvement. He saw Dr. Collier Salina and there was a question of a lytic process in the right pelvis. A bone scan on 09/21/2012 revealed no evidence of metastatic disease. 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 Renal insufficiency 14. Left leg DVT and pulmonary embolism 04/01/2015-maintained on xarelto 15. Hypercalcemia  Disposition: Mr. Oren was diagnosed with a left lower extremity DVT and pulmonary embolism earlier this month when he presented with dyspnea. He now has failure to thrive and hypercalcemia. It is possible his lethargy is related to hypercalcemia. The hypercalcemia could be due to prostate cancer, lymphoma, or another etiology. I discussed the current situation with Mr. Corporan and his family. He agrees to Zometa therapy today. We reviewed the potential side effects associated with Zometa including the  chance of osteonecrosis.  He will be scheduled for a CT of the abdomen/pelvis to look for  evidence of progressive prostate cancer or lymphoma. He will continue Xarelto anticoagulation.  Mr. Quezada will return for an office and lab visit on 05/06/2015. We will check a PTH level today.   [name], MD  04/26/2015  3:00 PM

## 2015-04-27 LAB — PSA: PSA: 19.55 ng/mL — ABNORMAL HIGH (ref <=4.00)

## 2015-04-30 ENCOUNTER — Telehealth: Payer: Self-pay | Admitting: Oncology

## 2015-04-30 DIAGNOSIS — I2699 Other pulmonary embolism without acute cor pulmonale: Secondary | ICD-10-CM | POA: Diagnosis not present

## 2015-04-30 DIAGNOSIS — I495 Sick sinus syndrome: Secondary | ICD-10-CM | POA: Diagnosis not present

## 2015-04-30 DIAGNOSIS — I739 Peripheral vascular disease, unspecified: Secondary | ICD-10-CM | POA: Diagnosis not present

## 2015-04-30 DIAGNOSIS — N183 Chronic kidney disease, stage 3 (moderate): Secondary | ICD-10-CM | POA: Diagnosis not present

## 2015-04-30 DIAGNOSIS — I4891 Unspecified atrial fibrillation: Secondary | ICD-10-CM | POA: Diagnosis not present

## 2015-04-30 DIAGNOSIS — I129 Hypertensive chronic kidney disease with stage 1 through stage 4 chronic kidney disease, or unspecified chronic kidney disease: Secondary | ICD-10-CM | POA: Diagnosis not present

## 2015-04-30 NOTE — Telephone Encounter (Signed)
pt daughter cld stated pt was not sch for labs per Dr Bernette Redbird orders-add lab and gave daughter time & date

## 2015-05-01 ENCOUNTER — Ambulatory Visit (HOSPITAL_COMMUNITY)
Admission: RE | Admit: 2015-05-01 | Discharge: 2015-05-01 | Disposition: A | Discharge status: Home / Self Care | Payer: Medicare Other | Source: Ambulatory Visit | Attending: Oncology | Admitting: Oncology

## 2015-05-01 ENCOUNTER — Other Ambulatory Visit (HOSPITAL_BASED_OUTPATIENT_CLINIC_OR_DEPARTMENT_OTHER): Payer: Medicare Other

## 2015-05-01 DIAGNOSIS — Z8572 Personal history of non-Hodgkin lymphomas: Secondary | ICD-10-CM

## 2015-05-01 DIAGNOSIS — R599 Enlarged lymph nodes, unspecified: Secondary | ICD-10-CM | POA: Diagnosis not present

## 2015-05-01 DIAGNOSIS — C61 Malignant neoplasm of prostate: Secondary | ICD-10-CM

## 2015-05-01 DIAGNOSIS — D630 Anemia in neoplastic disease: Secondary | ICD-10-CM | POA: Diagnosis not present

## 2015-05-01 DIAGNOSIS — N189 Chronic kidney disease, unspecified: Secondary | ICD-10-CM | POA: Diagnosis not present

## 2015-05-01 DIAGNOSIS — Z8546 Personal history of malignant neoplasm of prostate: Secondary | ICD-10-CM | POA: Diagnosis not present

## 2015-05-01 LAB — COMPREHENSIVE METABOLIC PANEL (CC13)
ALT: 20 U/L (ref 0–55)
AST: 23 U/L (ref 5–34)
Albumin: 3.2 g/dL — ABNORMAL LOW (ref 3.5–5.0)
Alkaline Phosphatase: 56 U/L (ref 40–150)
Anion Gap: 7 mEq/L (ref 3–11)
BILIRUBIN TOTAL: 0.4 mg/dL (ref 0.20–1.20)
BUN: 22.4 mg/dL (ref 7.0–26.0)
CO2: 28 meq/L (ref 22–29)
CREATININE: 1.5 mg/dL — AB (ref 0.7–1.3)
Calcium: 10.5 mg/dL — ABNORMAL HIGH (ref 8.4–10.4)
Chloride: 104 mEq/L (ref 98–109)
EGFR: 42 mL/min/{1.73_m2} — ABNORMAL LOW (ref >90)
Glucose: 95 mg/dl (ref 70–140)
Potassium: 4.7 mEq/L (ref 3.5–5.1)
SODIUM: 138 meq/L (ref 136–145)
TOTAL PROTEIN: 6 g/dL — AB (ref 6.4–8.3)

## 2015-05-02 DIAGNOSIS — I495 Sick sinus syndrome: Secondary | ICD-10-CM | POA: Diagnosis not present

## 2015-05-02 DIAGNOSIS — I129 Hypertensive chronic kidney disease with stage 1 through stage 4 chronic kidney disease, or unspecified chronic kidney disease: Secondary | ICD-10-CM | POA: Diagnosis not present

## 2015-05-02 DIAGNOSIS — I4891 Unspecified atrial fibrillation: Secondary | ICD-10-CM | POA: Diagnosis not present

## 2015-05-02 DIAGNOSIS — I739 Peripheral vascular disease, unspecified: Secondary | ICD-10-CM | POA: Diagnosis not present

## 2015-05-02 DIAGNOSIS — I2699 Other pulmonary embolism without acute cor pulmonale: Secondary | ICD-10-CM | POA: Diagnosis not present

## 2015-05-02 DIAGNOSIS — N183 Chronic kidney disease, stage 3 (moderate): Secondary | ICD-10-CM | POA: Diagnosis not present

## 2015-05-06 ENCOUNTER — Encounter: Payer: Self-pay | Admitting: Pharmacist

## 2015-05-06 ENCOUNTER — Other Ambulatory Visit: Payer: Self-pay | Admitting: *Deleted

## 2015-05-06 ENCOUNTER — Encounter (HOSPITAL_BASED_OUTPATIENT_CLINIC_OR_DEPARTMENT_OTHER): Payer: Medicare Other | Admitting: Oncology

## 2015-05-06 ENCOUNTER — Ambulatory Visit (HOSPITAL_BASED_OUTPATIENT_CLINIC_OR_DEPARTMENT_OTHER): Payer: Medicare Other | Admitting: Oncology

## 2015-05-06 ENCOUNTER — Telehealth: Payer: Self-pay | Admitting: Oncology

## 2015-05-06 VITALS — BP 123/53 | HR 65 | Temp 98.2°F | Resp 21 | Ht 70.0 in | Wt 217.7 lb

## 2015-05-06 DIAGNOSIS — D630 Anemia in neoplastic disease: Secondary | ICD-10-CM | POA: Diagnosis not present

## 2015-05-06 DIAGNOSIS — I82402 Acute embolism and thrombosis of unspecified deep veins of left lower extremity: Secondary | ICD-10-CM | POA: Diagnosis not present

## 2015-05-06 DIAGNOSIS — Z8572 Personal history of non-Hodgkin lymphomas: Secondary | ICD-10-CM

## 2015-05-06 DIAGNOSIS — N189 Chronic kidney disease, unspecified: Secondary | ICD-10-CM

## 2015-05-06 DIAGNOSIS — I2699 Other pulmonary embolism without acute cor pulmonale: Secondary | ICD-10-CM | POA: Diagnosis not present

## 2015-05-06 DIAGNOSIS — C61 Malignant neoplasm of prostate: Secondary | ICD-10-CM

## 2015-05-06 DIAGNOSIS — D6481 Anemia due to antineoplastic chemotherapy: Secondary | ICD-10-CM

## 2015-05-06 DIAGNOSIS — D631 Anemia in chronic kidney disease: Secondary | ICD-10-CM

## 2015-05-06 DIAGNOSIS — Z5111 Encounter for antineoplastic chemotherapy: Secondary | ICD-10-CM | POA: Insufficient documentation

## 2015-05-06 DIAGNOSIS — Z5181 Encounter for therapeutic drug level monitoring: Secondary | ICD-10-CM

## 2015-05-06 DIAGNOSIS — E785 Hyperlipidemia, unspecified: Secondary | ICD-10-CM

## 2015-05-06 LAB — BASIC METABOLIC PANEL (CC13)
Anion Gap: 6 mEq/L (ref 3–11)
BUN: 23.7 mg/dL (ref 7.0–26.0)
CHLORIDE: 106 meq/L (ref 98–109)
CO2: 26 meq/L (ref 22–29)
Calcium: 10.2 mg/dL (ref 8.4–10.4)
Creatinine: 1.6 mg/dL — ABNORMAL HIGH (ref 0.7–1.3)
EGFR: 38 mL/min/{1.73_m2} — ABNORMAL LOW (ref >90)
Glucose: 93 mg/dl (ref 70–140)
POTASSIUM: 4.6 meq/L (ref 3.5–5.1)
Sodium: 138 mEq/L (ref 136–145)

## 2015-05-06 MED ORDER — ABIRATERONE ACETATE 250 MG PO TABS: 1000.0000 mg | ORAL_TABLET | Freq: Every day | ORAL | Status: DC

## 2015-05-06 MED ORDER — PREDNISONE 5 MG PO TABS: 5.0000 mg | ORAL_TABLET | Freq: Two times a day (BID) | ORAL | Status: DC

## 2015-05-06 NOTE — Telephone Encounter (Signed)
Gave relative avs report and appointments for October. Central to call re bone scan.

## 2015-05-06 NOTE — Progress Notes (Signed)
[facility] OFFICE PROGRESS NOTE   Diagnosis: Non-Hodgkin's lymphoma, prostate cancer  INTERVAL HISTORY:   Edward Woodward returns as scheduled. He was treated with Zometa on 04/26/2015. He developed a fever and arthralgias for the next few days. His family report Edward Woodward is more alert. He continues to have malaise. He complains of pain with weightbearing at the right upper leg.  Objective:  Vital signs in last 24 hours:  Blood pressure 123/53, pulse 65, temperature 98.2 F (36.8 C), temperature source Oral, resp. rate 21, height 5' 10" (1.778 m), weight 217 lb 11.2 oz (98.748 kg), SpO2 96 %.  Resp: Coarse rhonchi at the left greater than right posterior chest, no respiratory distress Cardio: Regular rate and rhythm Vascular: Trace left greater than right low leg edema Musculoskeletal: No pain with motion at the right hip, no tenderness at the right trochanter or right thigh     Portacath/PICC-without erythema  Lab Results:  Lab Results  Component Value Date   WBC 6.2 04/26/2015   HGB 11.3* 04/26/2015   HCT 33.9* 04/26/2015   MCV 91.4 04/26/2015   PLT 114* 04/26/2015   NEUTROABS 3.7 04/26/2015   PSA 19.55  05/06/2015: Calcium 10.2  Images: CT of the abdomen and pelvis on 05/01/2015 enlarged lymph nodes include a 1.8 cm portacaval node, confluent soft tissue surrounding the aorta and IVC, and several mildly enlarged pelvic lymph nodes, no bone lesions., Degenerative changes at the right hip  Medications: I have reviewed the patient's current medications.  Assessment/Plan: 1. Non-Hodgkin's lymphoma (follicular grade 3 lymphoma) - status post 6 cycles of Cytoxan/prednisone/rituximab 07/01/2007 through 10/21/2007. He remains in clinical remission. 2. CT chest 04/01/2015 with no lymphadenopathy  CT abdomen/pelvis 05/01/2015 with mild progression of abdomen/pelvic lymphadenopathy 3. Prostate cancer - he has been treated with hormonal therapy, the PSA was  stable on 10/15/2014  4. History of a normocytic anemia secondary to non-Hodgkin's lymphoma, chemotherapy, and renal insufficiency - stable  5. History of Mild thrombocytopenia  6. History of bilateral hydronephrosis. Renal ultrasound 09/26/2014 consistent with medical renal disease. No hydronephrosis. 7. Coronary artery disease - status post coronary artery stent placement. 8. History of noncalcified lung nodules. 9. History of severe neutropenia July 2009 - likely related to delayed toxicity from rituximab. 10. Macular degeneration. 11. Right middle lobe density on a CT of the chest 06/18/2010 measuring 2.5 x 1.6 cm with mildly increased metabolic activity (SUV max 2.5) on a PET scan 06/27/2010. The area of increased density was less confluent and appeared smaller on a restaging CT 09/08/2010. Right middle lobe "scarring "with no new or suspicious pulmonary nodule on a CT 08/04/2011. 12. Mild increased metabolic activity associated with several lymph nodes on the PET scan 06/27/2010 - likely related to lymphoma. No lymphadenopathy in the mediastinum or axillary areas on the CT 08/04/2011. 13. Right hip discomfort-he received a steroid injection by his primary physician without improvement. He saw Dr. Collier Salina and there was a question of a lytic process in the right pelvis. A bone scan on 09/21/2012 revealed no evidence of metastatic disease. 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 Renal insufficiency 15. Left leg DVT and pulmonary embolism 04/01/2015-maintained on xarelto 16. Hypercalcemia-status post Zometa 04/26/2015, improved  Disposition: Edward Woodward appears more alert today. He had a flu like reaction with Zometa on 04/26/2015. The hypercalcemia has improved.  I discussed the restaging CT findings with Edward Woodward and his family. I reviewed the images in radiology. There are  small pelvic, retroperitoneal, and abdominal lymph nodes-potentially related to  progression of lymphoma or prostate cancer. The PSA is higher. I suspect the hypercalcemia is related to progressive prostate cancer. He will be referred for a staging bone scan.  We decided against a lymph node biopsy for now. I discussed treatment options with Edward Woodward and his family. He will continue Lupron and he will begin abiraterone/prednisone. I reviewed the potential toxicities associated with abiraterone and he agrees to proceed. He met with the [facility] oral chemotherapy pharmacist today.  Edward Woodward will be scheduled for a Lupron injection and bone scan 05/10/2015. The plan is to begin abiraerone and prednisone on 05/11/2015. He will return for an office visit 05/17/2015.    [name], MD  05/06/2015  2:50 PM

## 2015-05-06 NOTE — Progress Notes (Signed)
Oral Chemotherapy Pharmacist Encounter  I spoke with patient for overview of new oral chemotherapy medication for prostate cancer: Abiraterone (zytiga) and prednisone). Pt is doing well. The prescriptions have been sent to the [facility] for benefit analysis and approval.   Written informed consent obtained  Counseled patient on administration, dosing, side effects, safe handling, and monitoring. Side effects include but not limited to: hypertension, fatigue, edema, muscle pain.  Edward Woodward voiced understanding and appreciation.   All questions answered.  Will follow up with patient regarding insurance and pharmacy. Will follow up in 1-2 weeks for adherence and toxicity management.   Thank you,  Montel Clock, PharmD, [facility]

## 2015-05-06 NOTE — Progress Notes (Signed)
thanks

## 2015-05-07 ENCOUNTER — Telehealth: Payer: Self-pay | Admitting: Pharmacist

## 2015-05-07 DIAGNOSIS — I739 Peripheral vascular disease, unspecified: Secondary | ICD-10-CM | POA: Diagnosis not present

## 2015-05-07 DIAGNOSIS — I4891 Unspecified atrial fibrillation: Secondary | ICD-10-CM | POA: Diagnosis not present

## 2015-05-07 DIAGNOSIS — I2699 Other pulmonary embolism without acute cor pulmonale: Secondary | ICD-10-CM | POA: Diagnosis not present

## 2015-05-07 DIAGNOSIS — I129 Hypertensive chronic kidney disease with stage 1 through stage 4 chronic kidney disease, or unspecified chronic kidney disease: Secondary | ICD-10-CM | POA: Diagnosis not present

## 2015-05-07 DIAGNOSIS — N183 Chronic kidney disease, stage 3 (moderate): Secondary | ICD-10-CM | POA: Diagnosis not present

## 2015-05-07 DIAGNOSIS — I495 Sick sinus syndrome: Secondary | ICD-10-CM | POA: Diagnosis not present

## 2015-05-07 NOTE — Telephone Encounter (Signed)
New Rx for Zytiga faxed to [facility] on 10/10. Medication requires prior auth. PA sent to [facility] at [phone].

## 2015-05-08 NOTE — Telephone Encounter (Signed)
Pt approved but has high copay (> $2000/month). Called patient and made him aware of this. No patient assistance funds available currently. Pt will come in Friday to sign paperwork for assistance through [employer] support. We also have a Zytiga sample available for the patient that will cover him for a month. This will help. He will pick this up on Friday 10/14.   Sample: Zytiga 250 mg x 120 tablets LOT: [id number] Exp: 02/2016

## 2015-05-09 ENCOUNTER — Other Ambulatory Visit: Payer: Self-pay | Admitting: Oncology

## 2015-05-09 ENCOUNTER — Encounter (HOSPITAL_COMMUNITY)
Admission: RE | Admit: 2015-05-09 | Discharge: 2015-05-09 | Disposition: A | Discharge status: Home / Self Care | Payer: Medicare Other | Source: Ambulatory Visit | Attending: Oncology | Admitting: Oncology

## 2015-05-09 DIAGNOSIS — Z5181 Encounter for therapeutic drug level monitoring: Secondary | ICD-10-CM | POA: Insufficient documentation

## 2015-05-09 DIAGNOSIS — C61 Malignant neoplasm of prostate: Secondary | ICD-10-CM | POA: Insufficient documentation

## 2015-05-09 DIAGNOSIS — E785 Hyperlipidemia, unspecified: Secondary | ICD-10-CM | POA: Diagnosis not present

## 2015-05-09 MED ORDER — FLUDEOXYGLUCOSE F - 18 (FDG) INJECTION
8.6000 | Freq: Once | INTRAVENOUS | Status: DC | PRN
  Filled 2015-05-09: qty 8.6

## 2015-05-09 MED ORDER — TECHNETIUM TC 99M MEDRONATE IV KIT
26.8000 | PACK | Freq: Once | INTRAVENOUS | Status: AC | PRN
  Administered 2015-05-09: 26.8 via INTRAVENOUS

## 2015-05-10 ENCOUNTER — Ambulatory Visit (HOSPITAL_BASED_OUTPATIENT_CLINIC_OR_DEPARTMENT_OTHER): Payer: Medicare Other

## 2015-05-10 VITALS — BP 162/69 | HR 65 | Temp 97.7°F | Resp 18

## 2015-05-10 DIAGNOSIS — C61 Malignant neoplasm of prostate: Secondary | ICD-10-CM

## 2015-05-10 DIAGNOSIS — Z5111 Encounter for antineoplastic chemotherapy: Secondary | ICD-10-CM | POA: Diagnosis present

## 2015-05-10 MED ORDER — LEUPROLIDE ACETATE (6 MONTH) 45 MG IM KIT
45.0000 mg | PACK | INTRAMUSCULAR | Status: DC
  Administered 2015-05-10: 45 mg via INTRAMUSCULAR
  Filled 2015-05-10: qty 45

## 2015-05-10 NOTE — Patient Instructions (Signed)
Leuprolide depot injection What is this medicine? LEUPROLIDE (loo PROE lide) is a man-made protein that acts like a natural hormone in the body. It decreases testosterone in men and decreases estrogen in women. In men, this medicine is used to treat advanced prostate cancer. In women, some forms of this medicine may be used to treat endometriosis, uterine fibroids, or other male hormone-related problems. This medicine may be used for other purposes; ask your health care provider or pharmacist if you have questions. What should I tell my health care provider before I take this medicine? They need to know if you have any of these conditions: -diabetes -heart disease or previous heart attack -high blood pressure -high cholesterol -osteoporosis -pain or difficulty passing urine -spinal cord metastasis -stroke -tobacco smoker -unusual vaginal bleeding (women) -an unusual or allergic reaction to leuprolide, benzyl alcohol, other medicines, foods, dyes, or preservatives -pregnant or trying to get pregnant -breast-feeding How should I use this medicine? This medicine is for injection into a muscle or for injection under the skin. It is given by a health care professional in a hospital or clinic setting. The specific product will determine how it will be given to you. Make sure you understand which product you receive and how often you will receive it. Talk to your pediatrician regarding the use of this medicine in children. Special care may be needed. Overdosage: If you think you have taken too much of this medicine contact a poison control center or emergency room at once. NOTE: This medicine is only for you. Do not share this medicine with others. What if I miss a dose? It is important not to miss a dose. Call your doctor or health care professional if you are unable to keep an appointment. Depot injections: Depot injections are given either once-monthly, every 12 weeks, every 16 weeks, or  every 24 weeks depending on the product you are prescribed. The product you are prescribed will be based on if you are male or male, and your condition. Make sure you understand your product and dosing. What may interact with this medicine? Do not take this medicine with any of the following medications: -chasteberry This medicine may also interact with the following medications: -herbal or dietary supplements, like black cohosh or DHEA -male hormones, like estrogens or progestins and birth control pills, patches, rings, or injections -male hormones, like testosterone This list may not describe all possible interactions. Give your health care provider a list of all the medicines, herbs, non-prescription drugs, or dietary supplements you use. Also tell them if you smoke, drink alcohol, or use illegal drugs. Some items may interact with your medicine. What should I watch for while using this medicine? Visit your doctor or health care professional for regular checks on your progress. During the first weeks of treatment, your symptoms may get worse, but then will improve as you continue your treatment. You may get hot flashes, increased bone pain, increased difficulty passing urine, or an aggravation of nerve symptoms. Discuss these effects with your doctor or health care professional, some of them may improve with continued use of this medicine. Male patients may experience a menstrual cycle or spotting during the first months of therapy with this medicine. If this continues, contact your doctor or health care professional. What side effects may I notice from receiving this medicine? Side effects that you should report to your doctor or health care professional as soon as possible: -allergic reactions like skin rash, itching or hives, swelling of the   face, lips, or tongue -breathing problems -chest pain -depression or memory disorders -pain in your legs or groin -pain at site where injected or  implanted -severe headache -swelling of the feet and legs -visual changes -vomiting Side effects that usually do not require medical attention (report to your doctor or health care professional if they continue or are bothersome): -breast swelling or tenderness -decrease in sex drive or performance -diarrhea -hot flashes -loss of appetite -muscle, joint, or bone pains -nausea -redness or irritation at site where injected or implanted -skin problems or acne This list may not describe all possible side effects. Call your doctor for medical advice about side effects. You may report side effects to FDA at 1-800-FDA-1088. Where should I keep my medicine? This drug is given in a hospital or clinic and will not be stored at home. NOTE: This sheet is a summary. It may not cover all possible information. If you have questions about this medicine, talk to your doctor, pharmacist, or health care provider.    2016, Elsevier/Gold Standard. (2014-04-06 14:16:23)  

## 2015-05-13 ENCOUNTER — Other Ambulatory Visit: Payer: Medicare Other

## 2015-05-13 ENCOUNTER — Ambulatory Visit: Payer: Medicare Other | Admitting: Oncology

## 2015-05-14 ENCOUNTER — Encounter: Payer: Self-pay | Admitting: Oncology

## 2015-05-14 DIAGNOSIS — N183 Chronic kidney disease, stage 3 (moderate): Secondary | ICD-10-CM | POA: Diagnosis not present

## 2015-05-14 DIAGNOSIS — I129 Hypertensive chronic kidney disease with stage 1 through stage 4 chronic kidney disease, or unspecified chronic kidney disease: Secondary | ICD-10-CM | POA: Diagnosis not present

## 2015-05-14 DIAGNOSIS — I2699 Other pulmonary embolism without acute cor pulmonale: Secondary | ICD-10-CM | POA: Diagnosis not present

## 2015-05-14 DIAGNOSIS — I4891 Unspecified atrial fibrillation: Secondary | ICD-10-CM | POA: Diagnosis not present

## 2015-05-14 DIAGNOSIS — I739 Peripheral vascular disease, unspecified: Secondary | ICD-10-CM | POA: Diagnosis not present

## 2015-05-14 DIAGNOSIS — I495 Sick sinus syndrome: Secondary | ICD-10-CM | POA: Diagnosis not present

## 2015-05-14 NOTE — Progress Notes (Signed)
Per [employer] zytiga approved 02/06/15-05/06/18. I sent to medical records

## 2015-05-16 DIAGNOSIS — N183 Chronic kidney disease, stage 3 (moderate): Secondary | ICD-10-CM | POA: Diagnosis not present

## 2015-05-16 DIAGNOSIS — I4891 Unspecified atrial fibrillation: Secondary | ICD-10-CM | POA: Diagnosis not present

## 2015-05-16 DIAGNOSIS — I2699 Other pulmonary embolism without acute cor pulmonale: Secondary | ICD-10-CM | POA: Diagnosis not present

## 2015-05-16 DIAGNOSIS — I495 Sick sinus syndrome: Secondary | ICD-10-CM | POA: Diagnosis not present

## 2015-05-16 DIAGNOSIS — I129 Hypertensive chronic kidney disease with stage 1 through stage 4 chronic kidney disease, or unspecified chronic kidney disease: Secondary | ICD-10-CM | POA: Diagnosis not present

## 2015-05-16 DIAGNOSIS — I739 Peripheral vascular disease, unspecified: Secondary | ICD-10-CM | POA: Diagnosis not present

## 2015-05-17 ENCOUNTER — Telehealth: Payer: Self-pay | Admitting: Oncology

## 2015-05-17 ENCOUNTER — Ambulatory Visit (HOSPITAL_BASED_OUTPATIENT_CLINIC_OR_DEPARTMENT_OTHER): Payer: Medicare Other | Admitting: Nurse Practitioner

## 2015-05-17 ENCOUNTER — Ambulatory Visit (HOSPITAL_BASED_OUTPATIENT_CLINIC_OR_DEPARTMENT_OTHER): Payer: Medicare Other

## 2015-05-17 VITALS — BP 159/56 | HR 70 | Temp 97.0°F | Resp 18 | Ht 70.0 in | Wt 211.2 lb

## 2015-05-17 DIAGNOSIS — Z8572 Personal history of non-Hodgkin lymphomas: Secondary | ICD-10-CM

## 2015-05-17 DIAGNOSIS — C61 Malignant neoplasm of prostate: Secondary | ICD-10-CM | POA: Diagnosis not present

## 2015-05-17 DIAGNOSIS — Z5181 Encounter for therapeutic drug level monitoring: Secondary | ICD-10-CM

## 2015-05-17 DIAGNOSIS — E785 Hyperlipidemia, unspecified: Secondary | ICD-10-CM | POA: Diagnosis not present

## 2015-05-17 DIAGNOSIS — E784 Other hyperlipidemia: Secondary | ICD-10-CM | POA: Diagnosis not present

## 2015-05-17 LAB — CBC WITH DIFFERENTIAL/PLATELET
BASO%: 0.3 % (ref 0.0–2.0)
BASOS ABS: 0 10*3/uL (ref 0.0–0.1)
EOS ABS: 0.1 10*3/uL (ref 0.0–0.5)
EOS%: 0.6 % (ref 0.0–7.0)
HCT: 35 % — ABNORMAL LOW (ref 38.4–49.9)
HGB: 11.5 g/dL — ABNORMAL LOW (ref 13.0–17.1)
LYMPH%: 22.6 % (ref 14.0–49.0)
MCH: 29.7 pg (ref 27.2–33.4)
MCHC: 33 g/dL (ref 32.0–36.0)
MCV: 90.1 fL (ref 79.3–98.0)
MONO#: 0.5 10*3/uL (ref 0.1–0.9)
MONO%: 5.6 % (ref 0.0–14.0)
NEUT#: 6.6 10*3/uL — ABNORMAL HIGH (ref 1.5–6.5)
NEUT%: 70.9 % (ref 39.0–75.0)
Platelets: 240 10*3/uL (ref 140–400)
RBC: 3.89 10*6/uL — AB (ref 4.20–5.82)
RDW: 15.5 % — ABNORMAL HIGH (ref 11.0–14.6)
WBC: 9.3 10*3/uL (ref 4.0–10.3)
lymph#: 2.1 10*3/uL (ref 0.9–3.3)

## 2015-05-17 LAB — COMPREHENSIVE METABOLIC PANEL (CC13)
ALK PHOS: 59 U/L (ref 40–150)
ALT: 12 U/L (ref 0–55)
AST: 16 U/L (ref 5–34)
Albumin: 3.8 g/dL (ref 3.5–5.0)
Anion Gap: 9 mEq/L (ref 3–11)
BUN: 38.5 mg/dL — ABNORMAL HIGH (ref 7.0–26.0)
CHLORIDE: 103 meq/L (ref 98–109)
CO2: 27 meq/L (ref 22–29)
Calcium: 10.5 mg/dL — ABNORMAL HIGH (ref 8.4–10.4)
Creatinine: 1.4 mg/dL — ABNORMAL HIGH (ref 0.7–1.3)
EGFR: 44 mL/min/{1.73_m2} — ABNORMAL LOW (ref >90)
GLUCOSE: 102 mg/dL (ref 70–140)
POTASSIUM: 4.8 meq/L (ref 3.5–5.1)
SODIUM: 140 meq/L (ref 136–145)
Total Bilirubin: 0.46 mg/dL (ref 0.20–1.20)
Total Protein: 7.1 g/dL (ref 6.4–8.3)

## 2015-05-17 LAB — TRIGLYCERIDES: Triglycerides: 170 mg/dL — ABNORMAL HIGH (ref <150)

## 2015-05-17 LAB — PHOSPHORUS: Phosphorus: 2.8 mg/dL (ref 2.1–4.3)

## 2015-05-17 LAB — MAGNESIUM (CC13): MAGNESIUM: 2.5 mg/dL (ref 1.5–2.5)

## 2015-05-17 NOTE — Telephone Encounter (Signed)
per po fto sch pt appt-gave pt copy of avs-sent back to lab °

## 2015-05-17 NOTE — Progress Notes (Addendum)
[place] OFFICE PROGRESS NOTE   Diagnosis:  Non-Hodgkin's lymphoma, prostate cancer  INTERVAL HISTORY:   Edward Woodward returns as scheduled. He began abiraterone and prednisone 05/11/2015. He denies nausea/vomiting. No diarrhea. He denies pain. He is having periodic hot flashes. His wife feels his mental status continues to improve. He continues Xarelto. No bleeding.  Objective:  Vital signs in last 24 hours:  Blood pressure 159/56, pulse 70, temperature 97 F (36.1 C), temperature source Oral, resp. rate 18, height 5\' 10"  (1.778 m), weight 211 lb 3.2 oz (95.8 kg), SpO2 98 %.    HEENT: No thrush or ulcers. Lymphatics: No palpable cervical or supraclavicular lymph nodes. Resp: Lungs clear bilaterally. Cardio: Regular rate and rhythm. GI: Abdomen soft and nontender. No organomegaly. Vascular: No edema. Neuro: Alert and oriented. Follows commands.    Lab Results:  Lab Results  Component Value Date   WBC 6.2 04/26/2015   HGB 11.3* 04/26/2015   HCT 33.9* 04/26/2015   MCV 91.4 04/26/2015   PLT 114* 04/26/2015   NEUTROABS 3.7 04/26/2015    Imaging:  No results found.  Medications: I have reviewed the patient's current medications.  Assessment/Plan: 1. Non-Hodgkin's lymphoma (follicular grade 3 lymphoma) - status post 6 cycles of Cytoxan/prednisone/rituximab 07/01/2007 through 10/21/2007. He remains in clinical remission. 2. CT chest 04/01/2015 with no lymphadenopathy  CT abdomen/pelvis 05/01/2015 with mild progression of abdomen/pelvic lymphadenopathy 3. Prostate cancer - he has been treated with hormonal therapy, the PSA was stable on 10/15/2014  PSA increased 04/26/2015   Bone scan 05/09/2015 with unchanged increased activity in the thoracic and lumbar spine felt to most likely be degenerative.   Initiation of Abiraterone and prednisone 05/11/2015. 4. History of a normocytic anemia secondary to non-Hodgkin's lymphoma, chemotherapy, and renal  insufficiency - stable  5. History of Mild thrombocytopenia  6. History of bilateral hydronephrosis. Renal ultrasound 09/26/2014 consistent with medical renal disease. No hydronephrosis. 7. Coronary artery disease - status post coronary artery stent placement. 8. History of noncalcified lung nodules. 9. History of severe neutropenia July 2009 - likely related to delayed toxicity from rituximab. 10. Macular degeneration. 11. Right middle lobe density on a CT of the chest 06/18/2010 measuring 2.5 x 1.6 cm with mildly increased metabolic activity (SUV max 2.5) on a PET scan 06/27/2010. The area of increased density was less confluent and appeared smaller on a restaging CT 09/08/2010. Right middle lobe "scarring" with no new or suspicious pulmonary nodule on a CT 08/04/2011. 12. Mild increased metabolic activity associated with several lymph nodes on the PET scan 06/27/2010 - likely related to lymphoma. No lymphadenopathy in the mediastinum or axillary areas on the CT 08/04/2011. 13. Right hip discomfort-he received a steroid injection by his primary physician without improvement. He saw Dr. Collier Salina and there was a question of a lytic process in the right pelvis. A bone scan on 09/21/2012 revealed no evidence of metastatic disease. 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 Renal insufficiency 15. Left leg DVT and pulmonary embolism 04/01/2015-maintained on xarelto 16. Hypercalcemia-status post Zometa 04/26/2015, improved 05/06/2015   Disposition: Edward Woodward appears stable. He will continue Abiraterone and prednisone. We will repeat a PSA in approximately 2 months.   His mental status is better. We will follow-up on the calcium from today.  He will return for labs in 2 weeks and a follow-up visit in 4 weeks. He will contact the office in the interim with any problems.  Patient seen with Dr. Benay Spice.  [name] ANP/GNP-BC   05/17/2015  2:01 PM  This  was a shared visit with [name]. The bone scan shows no evidence of disease progression. He will continue abiraterone for treatment of prostate cancer. We will need to reconsider treatment for non-Hodgkin's lymphoma if the hypercalcemia does not resolve.  [name], M.D.

## 2015-05-20 ENCOUNTER — Encounter: Payer: Self-pay | Admitting: Internal Medicine

## 2015-05-28 DIAGNOSIS — I129 Hypertensive chronic kidney disease with stage 1 through stage 4 chronic kidney disease, or unspecified chronic kidney disease: Secondary | ICD-10-CM | POA: Diagnosis not present

## 2015-05-28 DIAGNOSIS — N183 Chronic kidney disease, stage 3 (moderate): Secondary | ICD-10-CM | POA: Diagnosis not present

## 2015-05-28 DIAGNOSIS — I4891 Unspecified atrial fibrillation: Secondary | ICD-10-CM | POA: Diagnosis not present

## 2015-05-28 DIAGNOSIS — I739 Peripheral vascular disease, unspecified: Secondary | ICD-10-CM | POA: Diagnosis not present

## 2015-05-28 DIAGNOSIS — I2699 Other pulmonary embolism without acute cor pulmonale: Secondary | ICD-10-CM | POA: Diagnosis not present

## 2015-05-28 DIAGNOSIS — I495 Sick sinus syndrome: Secondary | ICD-10-CM | POA: Diagnosis not present

## 2015-05-30 DIAGNOSIS — I2699 Other pulmonary embolism without acute cor pulmonale: Secondary | ICD-10-CM | POA: Diagnosis not present

## 2015-05-30 DIAGNOSIS — I4891 Unspecified atrial fibrillation: Secondary | ICD-10-CM | POA: Diagnosis not present

## 2015-05-30 DIAGNOSIS — N183 Chronic kidney disease, stage 3 (moderate): Secondary | ICD-10-CM | POA: Diagnosis not present

## 2015-05-30 DIAGNOSIS — I129 Hypertensive chronic kidney disease with stage 1 through stage 4 chronic kidney disease, or unspecified chronic kidney disease: Secondary | ICD-10-CM | POA: Diagnosis not present

## 2015-05-30 DIAGNOSIS — I495 Sick sinus syndrome: Secondary | ICD-10-CM | POA: Diagnosis not present

## 2015-05-30 DIAGNOSIS — I739 Peripheral vascular disease, unspecified: Secondary | ICD-10-CM | POA: Diagnosis not present

## 2015-06-03 ENCOUNTER — Encounter: Payer: Self-pay | Admitting: Interventional Cardiology

## 2015-06-13 NOTE — Progress Notes (Signed)
Not seen

## 2015-06-14 ENCOUNTER — Other Ambulatory Visit: Payer: Self-pay | Admitting: Physician Assistant

## 2015-06-14 ENCOUNTER — Ambulatory Visit (HOSPITAL_BASED_OUTPATIENT_CLINIC_OR_DEPARTMENT_OTHER): Payer: Medicare Other | Admitting: Oncology

## 2015-06-14 ENCOUNTER — Telehealth: Payer: Self-pay | Admitting: Oncology

## 2015-06-14 ENCOUNTER — Other Ambulatory Visit (HOSPITAL_BASED_OUTPATIENT_CLINIC_OR_DEPARTMENT_OTHER): Payer: Medicare Other

## 2015-06-14 VITALS — BP 157/71 | HR 64 | Temp 97.8°F | Resp 18 | Ht 70.0 in | Wt 215.8 lb

## 2015-06-14 DIAGNOSIS — C61 Malignant neoplasm of prostate: Secondary | ICD-10-CM

## 2015-06-14 LAB — COMPREHENSIVE METABOLIC PANEL (CC13)
ALK PHOS: 49 U/L (ref 40–150)
ALT: 14 U/L (ref 0–55)
ANION GAP: 6 meq/L (ref 3–11)
AST: 16 U/L (ref 5–34)
Albumin: 3.5 g/dL (ref 3.5–5.0)
BILIRUBIN TOTAL: 0.55 mg/dL (ref 0.20–1.20)
BUN: 23.2 mg/dL (ref 7.0–26.0)
CALCIUM: 10 mg/dL (ref 8.4–10.4)
CO2: 28 mEq/L (ref 22–29)
Chloride: 106 mEq/L (ref 98–109)
Creatinine: 1.3 mg/dL (ref 0.7–1.3)
EGFR: 51 mL/min/{1.73_m2} — ABNORMAL LOW (ref >90)
GLUCOSE: 89 mg/dL (ref 70–140)
Potassium: 4.8 mEq/L (ref 3.5–5.1)
Sodium: 140 mEq/L (ref 136–145)
TOTAL PROTEIN: 6.2 g/dL — AB (ref 6.4–8.3)

## 2015-06-14 LAB — CBC WITH DIFFERENTIAL/PLATELET
BASO%: 0.3 % (ref 0.0–2.0)
BASOS ABS: 0 10*3/uL (ref 0.0–0.1)
EOS ABS: 0.1 10*3/uL (ref 0.0–0.5)
EOS%: 1.5 % (ref 0.0–7.0)
HEMATOCRIT: 35.5 % — AB (ref 38.4–49.9)
HEMOGLOBIN: 11.5 g/dL — AB (ref 13.0–17.1)
LYMPH#: 2.3 10*3/uL (ref 0.9–3.3)
LYMPH%: 30.7 % (ref 14.0–49.0)
MCH: 29.9 pg (ref 27.2–33.4)
MCHC: 32.5 g/dL (ref 32.0–36.0)
MCV: 92 fL (ref 79.3–98.0)
MONO#: 0.5 10*3/uL (ref 0.1–0.9)
MONO%: 6.4 % (ref 0.0–14.0)
NEUT#: 4.7 10*3/uL (ref 1.5–6.5)
NEUT%: 61.1 % (ref 39.0–75.0)
Platelets: 131 10*3/uL — ABNORMAL LOW (ref 140–400)
RBC: 3.85 10*6/uL — ABNORMAL LOW (ref 4.20–5.82)
RDW: 17.3 % — AB (ref 11.0–14.6)
WBC: 7.6 10*3/uL (ref 4.0–10.3)

## 2015-06-14 NOTE — Progress Notes (Signed)
  [facility] OFFICE PROGRESS NOTE   Diagnosis: Prostate cancer, non-Hodgkin's lymphoma  INTERVAL HISTORY:   Edward Woodward returns as scheduled. He reports feeling well. Improved appetite and energy level. No apparent side effects from the prednisone or abiraterone. He had a recent fall in his closet. He says this was a syncope event without warning.  Objective:  Vital signs in last 24 hours:  Blood pressure 157/71, pulse 64, temperature 97.8 F (36.6 C), temperature source Oral, resp. rate 18, height 5\' 10"  (1.778 m), weight 215 lb 12.8 oz (97.886 kg), SpO2 97 %.    HEENT: No thrush Resp: Lungs clear bilaterally Cardio: Regular rate and rhythm GI: No hepatomegaly, no mass, no splenomegaly Vascular: Trace edema at the left greater than right lower leg and ankle Neuro: Alert and oriented, follows commands       Lab Results:  Lab Results  Component Value Date   WBC 7.6 06/14/2015   HGB 11.5* 06/14/2015   HCT 35.5* 06/14/2015   MCV 92.0 06/14/2015   PLT 131* 06/14/2015   NEUTROABS 4.7 06/14/2015    BUN 23.2, creatinine 1.3, glucose 89, calcium 10, albumin 3.5   Medications: I have reviewed the patient's current medications.  Assessment/Plan: 1. Non-Hodgkin's lymphoma (follicular grade 3 lymphoma) - status post 6 cycles of Cytoxan/prednisone/rituximab 07/01/2007 through 10/21/2007. He remains in clinical remission. 2. CT chest 04/01/2015 with no lymphadenopathy  CT abdomen/pelvis 05/01/2015 with mild progression of abdomen/pelvic lymphadenopathy 3. Prostate cancer - he has been treated with hormonal therapy, the PSA was stable on 10/15/2014  PSA increased 04/26/2015   Bone scan 05/09/2015 with unchanged increased activity in the thoracic and lumbar spine felt to most likely be degenerative.   Initiation of Abiraterone and prednisone 05/11/2015. 4. History of a normocytic anemia secondary to non-Hodgkin's lymphoma, chemotherapy, and renal  insufficiency - stable  5. History of Mild thrombocytopenia  6. History of bilateral hydronephrosis. Renal ultrasound 09/26/2014 consistent with medical renal disease. No hydronephrosis. 7. Coronary artery disease - status post coronary artery stent placement. 8. History of noncalcified lung nodules. 9. History of severe neutropenia July 2009 - likely related to delayed toxicity from rituximab. 10. Macular degeneration. 11. Right middle lobe density on a CT of the chest 06/18/2010 measuring 2.5 x 1.6 cm with mildly increased metabolic activity (SUV max 2.5) on a PET scan 06/27/2010. The area of increased density was less confluent and appeared smaller on a restaging CT 09/08/2010. Right middle lobe "scarring" with no new or suspicious pulmonary nodule on a CT 08/04/2011. 12. Mild increased metabolic activity associated with several lymph nodes on the PET scan 06/27/2010 - likely related to lymphoma. No lymphadenopathy in the mediastinum or axillary areas on the CT 08/04/2011. 13. Right hip discomfort-he received a steroid injection by his primary physician without improvement. He saw Dr. Collier Salina and there was a question of a lytic process in the right pelvis. A bone scan on 09/21/2012 revealed no evidence of metastatic disease. 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 Renal insufficiency 15. Left leg DVT and pulmonary embolism 04/01/2015-maintained on xarelto 16. Hypercalcemia-status post Zometa 04/26/2015, improved   Disposition:  Edward Woodward has an improved performance status since beginning abiraterone/prednisone. The calcium has improved. He will return for an office visit and repeat PSA in one month.  [name], MD  06/14/2015  2:59 PM

## 2015-06-14 NOTE — Telephone Encounter (Signed)
Gave and printed appts ched and avs for pt for DEC  °

## 2015-06-18 ENCOUNTER — Encounter: Payer: Self-pay | Admitting: Pharmacist

## 2015-06-18 NOTE — Progress Notes (Signed)
Oral Chemotherapy Follow-Up Form  Original Start date of oral chemotherapy: _10/15/16______   Called patient today to follow up regarding patient's oral chemotherapy medication: _Zytiga___  Pt is doing well today. He has been on Zytiga and prednisone for a little over a month now. He is feeling great with more energy. No issues to report. No side effects. Pt receives free drug through [place] and [place] patient assistance foundation. But he has Zytiga filled through [facility] pharmacy.   Pt reports _0___ tablets/doses missed in the last week/month.      Will follow up and call patient again in _3 weeks___   Thank you,  Montel Clock, PharmD, [facility]

## 2015-07-09 ENCOUNTER — Telehealth: Payer: Self-pay | Admitting: Oncology

## 2015-07-09 ENCOUNTER — Ambulatory Visit (HOSPITAL_BASED_OUTPATIENT_CLINIC_OR_DEPARTMENT_OTHER): Payer: Medicare Other | Admitting: Oncology

## 2015-07-09 ENCOUNTER — Telehealth: Payer: Self-pay | Admitting: *Deleted

## 2015-07-09 ENCOUNTER — Other Ambulatory Visit (HOSPITAL_COMMUNITY)
Admission: RE | Admit: 2015-07-09 | Discharge: 2015-07-09 | Disposition: A | Discharge status: Home / Self Care | Payer: Medicare Other | Source: Ambulatory Visit | Attending: Oncology | Admitting: Oncology

## 2015-07-09 ENCOUNTER — Other Ambulatory Visit (HOSPITAL_BASED_OUTPATIENT_CLINIC_OR_DEPARTMENT_OTHER): Payer: Medicare Other

## 2015-07-09 VITALS — BP 159/73 | HR 64 | Temp 98.2°F | Resp 18 | Ht 70.0 in | Wt 220.2 lb

## 2015-07-09 DIAGNOSIS — C61 Malignant neoplasm of prostate: Secondary | ICD-10-CM

## 2015-07-09 DIAGNOSIS — Z8572 Personal history of non-Hodgkin lymphomas: Secondary | ICD-10-CM

## 2015-07-09 DIAGNOSIS — Z7901 Long term (current) use of anticoagulants: Secondary | ICD-10-CM | POA: Diagnosis not present

## 2015-07-09 DIAGNOSIS — D6481 Anemia due to antineoplastic chemotherapy: Secondary | ICD-10-CM | POA: Diagnosis not present

## 2015-07-09 DIAGNOSIS — I82402 Acute embolism and thrombosis of unspecified deep veins of left lower extremity: Secondary | ICD-10-CM | POA: Diagnosis not present

## 2015-07-09 LAB — COMPREHENSIVE METABOLIC PANEL
ALBUMIN: 3.8 g/dL (ref 3.5–5.0)
ALT: 15 U/L — ABNORMAL LOW (ref 17–63)
ANION GAP: 7 (ref 5–15)
AST: 17 U/L (ref 15–41)
Alkaline Phosphatase: 42 U/L (ref 38–126)
BUN: 26 mg/dL — ABNORMAL HIGH (ref 6–20)
CO2: 25 mmol/L (ref 22–32)
Calcium: 9.5 mg/dL (ref 8.9–10.3)
Chloride: 107 mmol/L (ref 101–111)
Creatinine, Ser: 1.24 mg/dL (ref 0.61–1.24)
GFR calc Af Amer: 59 mL/min — ABNORMAL LOW (ref >60)
GFR calc non Af Amer: 51 mL/min — ABNORMAL LOW (ref >60)
GLUCOSE: 88 mg/dL (ref 65–99)
POTASSIUM: 4.3 mmol/L (ref 3.5–5.1)
SODIUM: 139 mmol/L (ref 135–145)
Total Bilirubin: 0.4 mg/dL (ref 0.3–1.2)
Total Protein: 6.5 g/dL (ref 6.5–8.1)

## 2015-07-09 LAB — CBC WITH DIFFERENTIAL/PLATELET
BASO%: 0.3 % (ref 0.0–2.0)
Basophils Absolute: 0 10*3/uL (ref 0.0–0.1)
EOS%: 3.7 % (ref 0.0–7.0)
Eosinophils Absolute: 0.3 10*3/uL (ref 0.0–0.5)
HCT: 34 % — ABNORMAL LOW (ref 38.4–49.9)
HEMOGLOBIN: 11.3 g/dL — AB (ref 13.0–17.1)
LYMPH%: 33.3 % (ref 14.0–49.0)
MCH: 30.7 pg (ref 27.2–33.4)
MCHC: 33.2 g/dL (ref 32.0–36.0)
MCV: 92.4 fL (ref 79.3–98.0)
MONO#: 0.7 10*3/uL (ref 0.1–0.9)
MONO%: 8 % (ref 0.0–14.0)
NEUT%: 54.7 % (ref 39.0–75.0)
NEUTROS ABS: 4.9 10*3/uL (ref 1.5–6.5)
Platelets: 138 10*3/uL — ABNORMAL LOW (ref 140–400)
RBC: 3.68 10*6/uL — ABNORMAL LOW (ref 4.20–5.82)
RDW: 15.6 % — AB (ref 11.0–14.6)
WBC: 8.9 10*3/uL (ref 4.0–10.3)
lymph#: 3 10*3/uL (ref 0.9–3.3)

## 2015-07-09 NOTE — Progress Notes (Signed)
  [place] OFFICE PROGRESS NOTE   Diagnosis: Prostate cancer, non-Hodgkin's lymphoma  INTERVAL HISTORY:   Mr. Hedinger returns for scheduled visit. He continues abiraterone/prednisone. Good appetite. He feels well. No specific complaint.  Objective:  Vital signs in last 24 hours:  Blood pressure 159/73, pulse 64, temperature 98.2 F (36.8 C), temperature source Oral, resp. rate 18, height 5\' 10"  (1.778 m), weight 220 lb 3.2 oz (99.882 kg), SpO2 97 %.    HEENT: No thrush Lymphatics: No cervical, supra-clavicular, axillary, or inguinal nodes. Soft fullness in the posterior left scalene area Resp: Lungs clear bilaterally Cardio: Regular rate and rhythm GI: No hepatosplenomegaly, no mass, nontender Vascular: Trace edema at the left lower leg     Lab Results:  Lab Results  Component Value Date   WBC 8.9 07/09/2015   HGB 11.3* 07/09/2015   HCT 34.0* 07/09/2015   MCV 92.4 07/09/2015   PLT 138* 07/09/2015   NEUTROABS 4.9 07/09/2015     Medications: I have reviewed the patient's current medications.  Assessment/Plan: 1. Non-Hodgkin's lymphoma (follicular grade 3 lymphoma) - status post 6 cycles of Cytoxan/prednisone/rituximab 07/01/2007 through 10/21/2007. He remains in clinical remission. 2. CT chest 04/01/2015 with no lymphadenopathy  CT abdomen/pelvis 05/01/2015 with mild progression of abdomen/pelvic lymphadenopathy 3. Prostate cancer - he has been treated with hormonal therapy, the PSA was stable on 10/15/2014  PSA increased 04/26/2015   Bone scan 05/09/2015 with unchanged increased activity in the thoracic and lumbar spine felt to most likely be degenerative.   Initiation of Abiraterone and prednisone 05/11/2015. 4. History of a normocytic anemia secondary to non-Hodgkin's lymphoma, chemotherapy, and renal insufficiency - stable  5. History of Mild thrombocytopenia  6. History of bilateral hydronephrosis. Renal ultrasound 09/26/2014 consistent  with medical renal disease. No hydronephrosis. 7. Coronary artery disease - status post coronary artery stent placement. 8. History of noncalcified lung nodules. 9. History of severe neutropenia July 2009 - likely related to delayed toxicity from rituximab. 10. Macular degeneration. 11. Right middle lobe density on a CT of the chest 06/18/2010 measuring 2.5 x 1.6 cm with mildly increased metabolic activity (SUV max 2.5) on a PET scan 06/27/2010. The area of increased density was less confluent and appeared smaller on a restaging CT 09/08/2010. Right middle lobe "scarring" with no new or suspicious pulmonary nodule on a CT 08/04/2011. 12. Mild increased metabolic activity associated with several lymph nodes on the PET scan 06/27/2010 - likely related to lymphoma. No lymphadenopathy in the mediastinum or axillary areas on the CT 08/04/2011. 13. Right hip discomfort-he received a steroid injection by his primary physician without improvement. He saw Dr. Collier Salina and there was a question of a lytic process in the right pelvis. A bone scan on 09/21/2012 revealed no evidence of metastatic disease. 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 Renal insufficiency 15. Left leg DVT and pulmonary embolism 04/01/2015-maintained on xarelto 16. Hypercalcemia-status post Zometa 04/26/2015, improved   Disposition:  Mr. Malsch appears stable. The plan is to continue abiraterone/prednisone. We will follow-up on the calcium level and PSA from today. He will return for an office and lab visit in one month. We will consider decreasing the prednisone dose if he continues to gain weight.  [name], MD  07/09/2015  4:16 PM

## 2015-07-09 NOTE — Telephone Encounter (Signed)
per pof to sch pt appt-gave pt copy of avs °

## 2015-07-09 NOTE — Telephone Encounter (Signed)
per pof to sch pt appt-pt is here-

## 2015-07-09 NOTE — Telephone Encounter (Signed)
Called pt's wife with an appointment for this afternoon. APP out of the office 12/15. They agree to come in today, reports they were to pick up a new Zytiga Rx today anyway. Order to schedulers.

## 2015-07-10 ENCOUNTER — Ambulatory Visit: Payer: Medicare Other | Admitting: Interventional Cardiology

## 2015-07-10 LAB — PSA: PSA: 3.42 ng/mL (ref <=4.00)

## 2015-07-11 ENCOUNTER — Ambulatory Visit: Payer: Medicare Other | Admitting: Nurse Practitioner

## 2015-07-11 ENCOUNTER — Other Ambulatory Visit: Payer: Medicare Other

## 2015-08-05 ENCOUNTER — Encounter: Payer: Self-pay | Admitting: Interventional Cardiology

## 2015-08-05 ENCOUNTER — Ambulatory Visit (INDEPENDENT_AMBULATORY_CARE_PROVIDER_SITE_OTHER): Payer: Medicare Other | Admitting: Interventional Cardiology

## 2015-08-05 VITALS — BP 146/76 | HR 68 | Ht 70.0 in | Wt 224.0 lb

## 2015-08-05 DIAGNOSIS — Z45018 Encounter for adjustment and management of other part of cardiac pacemaker: Secondary | ICD-10-CM

## 2015-08-05 DIAGNOSIS — I482 Chronic atrial fibrillation, unspecified: Secondary | ICD-10-CM

## 2015-08-05 DIAGNOSIS — I495 Sick sinus syndrome: Secondary | ICD-10-CM

## 2015-08-05 DIAGNOSIS — I25119 Atherosclerotic heart disease of native coronary artery with unspecified angina pectoris: Secondary | ICD-10-CM | POA: Diagnosis not present

## 2015-08-05 DIAGNOSIS — I739 Peripheral vascular disease, unspecified: Secondary | ICD-10-CM

## 2015-08-05 MED FILL — ZYTIGA 250 MG TABLET: 250 | 30 days supply | Qty: 120 | Fill #2

## 2015-08-05 NOTE — Progress Notes (Signed)
Cardiology Office Note   Date:  08/05/2015   ID:  Edward Woodward, DOB Jul 17, 1927, MRN [id number]  PCP:  Mathews Argyle, MD  Cardiologist:  Sinclair Grooms, MD   Chief Complaint  Patient presents with  . Congestive Heart Failure      History of Present Illness: Edward Woodward is a 82 y.o. male who presents for coronary artery disease, bypass surgery 1985, history of pulmonary emboli, lymphoma, prostate cancer, sinus node dysfunction with permanent pacemaker, and essential hypertension.  [name] denies specific cardiopulmonary complaints. He has not had syncope, chest pain, orthopnea, or PND. He recently had bilateral pulmonary emboli (September 2016). He is currently on anticoagulation therapy with Xarelto and chemotherapy has also been started for prostate cancer/lymphoma(Zytiga). Plavix was discontinued when anticoagulation was started. Since discharge from the hospital, the patient is taking furosemide as needed for lower extremity swelling rather than daily. With this regimen he has not had specific shortness of breath or excessive swelling. He has been followed closely by [name].  Past Medical History  Diagnosis Date  . Cancer   . Prostate cancer   . Neuropathy   . Hypercholesteremia   . Depressive disorder, not elsewhere classified   . Angina pectoris   . Sinoatrial node dysfunction   . Degeneration of lumbar or lumbosacral intervertebral disc   . Dyspnea   . CAD (coronary artery disease)      NYHA Class 1B, CABG 1985  . HTN (hypertension)   . OSA on CPAP   . Collagen vascular disease   . Complication of anesthesia     " I get confused afterwords  "  . PE (pulmonary embolism) 03/2015  . Neuromuscular disorder     neuropathy  . GERD (gastroesophageal reflux disease)   . Presence of permanent cardiac pacemaker     Past Surgical History  Procedure Laterality Date  . Coronary artery bypass graft    . Exploratory laparotomy    .  Endarterectomy    . Rotator cuff repair    . Lumbar fusion    . Coronary angioplasty with stent placement      s/p bare metal stent implant SVG-diagonal 11/23/05, s/p BM stent implantation SVG diagonal 10/22/06 (feeds LAD), and 05/2010 with DES to left circumflex  . Pacemaker insertion       Current Outpatient Prescriptions  Medication Sig Dispense Refill  . abiraterone Acetate (ZYTIGA) 250 MG tablet Take 4 tablets (1,000 mg total) by mouth daily. Take on an empty stomach 1 hour before or 2 hours after a meal 120 tablet 3  . atorvastatin (LIPITOR) 20 MG tablet Take 20 mg by mouth every other day.    . b complex vitamins tablet Take 1 tablet by mouth daily.      . Cholecalciferol (VITAMIN D3) 2000 UNITS TABS Take 2 tablets by mouth daily.     . citalopram (CELEXA) 20 MG tablet Take 20 mg by mouth daily.      . Cyanocobalamin (VITAMIN B-12 CR) 1000 MCG TBCR Take 1 tablet by mouth daily.    . furosemide (LASIX) 40 MG tablet Take one to two (1-2) tablets (40 mg to 80 mg total) by mouth daily as needed for edema.    . gabapentin (NEURONTIN) 300 MG capsule Take 300 mg by mouth 2 (two) times daily. 2 times daily w/ additional dose PRN, not to exceed TID    . Melatonin 3 MG TABS Take 3 mg by mouth at bedtime.    Marland Kitchen  metoprolol succinate (TOPROL-XL) 25 MG 24 hr tablet TAKE ONE TABLET BY MOUTH ONCE DAILY 30 tablet 2  . Multiple Vitamin (MULTIVITAMIN) capsule Take 1 capsule by mouth daily.    . nitroGLYCERIN (NITROLINGUAL) 0.4 MG/SPRAY spray Place 1 spray under the tongue every 5 (five) minutes x 3 doses as needed for chest pain. 12 g 4  . Polyethyl Glycol-Propyl Glycol (SYSTANE) 0.4-0.3 % SOLN Apply 1 drop to eye at bedtime.    . predniSONE (DELTASONE) 5 MG tablet Take 1 tablet (5 mg total) by mouth 2 (two) times daily. 60 tablet 3  . UBIQUINOL PO Take 1 tablet by mouth daily. CoQ10    . XARELTO 20 MG TABS tablet Take 20 mg by mouth daily.     No current facility-administered medications for this  visit.    Allergies:   Penicillins and Simvastatin    Social History:  The patient  reports that he has never smoked. He has never used smokeless tobacco. He reports that he does not drink alcohol or use illicit drugs.   Family History:  The patient's family history includes Heart disease in his father.    ROS:  Please see the history of present illness.   Otherwise, review of systems are positive for difficulty with balance, sleep apnea, back discomfort, easy bruising, hearing loss, and vision disturbance..   All other systems are reviewed and negative.    PHYSICAL EXAM: VS:  BP 146/76 mmHg  Pulse 68  Ht 5\' 10"  (1.778 m)  Wt 224 lb (101.606 kg)  BMI 32.14 kg/m2 , BMI Body mass index is 32.14 kg/(m^2). GEN: Well nourished, well developed, in no acute distress. Elderly and frail. HEENT: normal Neck: no JVD, carotid bruits, or masses Cardiac: RRR.  There is no murmur, rub, or gallop. There is 2+ edema. Respiratory:  clear to auscultation bilaterally, normal work of breathing. GI: soft, nontender, nondistended, + BS MS: no deformity or atrophy Skin: warm and dry, no rash Neuro:  Strength and sensation are intact Psych: euthymic mood, full affect   EKG:  EKG is not ordered today. The ekg reveals none   Recent Labs: 03/31/2015: B Natriuretic Peptide 193.2* 05/17/2015: Magnesium 2.5 07/09/2015: ALT 15*; BUN 26*; Creatinine, Ser 1.24; HGB 11.3*; Platelets 138*; Potassium 4.3; Sodium 139    Lipid Panel    Component Value Date/Time   CHOL  06/18/2010 0824    123        ATP III CLASSIFICATION:  <200     mg/dL   Desirable  200-239  mg/dL   Borderline High  >=240    mg/dL   High          TRIG 170* 05/17/2015 1529   HDL 40 06/18/2010 0824   CHOLHDL 3.1 06/18/2010 0824   VLDL 19 06/18/2010 0824   LDLCALC  06/18/2010 0824    64        Total Cholesterol/HDL:CHD Risk Coronary Heart Disease Risk Table                     Men   Women  1/2 Average Risk   3.4   3.3  Average  Risk       5.0   4.4  2 X Average Risk   9.6   7.1  3 X Average Risk  23.4   11.0        Use the calculated Patient Ratio above and the CHD Risk Table to determine the patient's CHD Risk.  ATP III CLASSIFICATION (LDL):  <100     mg/dL   Optimal  100-129  mg/dL   Near or Above                    Optimal  130-159  mg/dL   Borderline  160-189  mg/dL   High  >190     mg/dL   Very High      Wt Readings from Last 3 Encounters:  08/05/15 224 lb (101.606 kg)  07/09/15 220 lb 3.2 oz (99.882 kg)  06/14/15 215 lb 12.8 oz (97.886 kg)      Other studies Reviewed: Additional studies/ records that were reviewed today include: None. The findings include none.    ASSESSMENT AND PLAN:  1. Sinus node dysfunction (HCC) Stable  2. Cardiac pacemaker -[employer] Normal function  3. Atherosclerosis of native coronary artery of native heart with angina pectoris ([place]) Asymptomatic  4. Chronic atrial fibrillation (HCC) Presumed present but not documented by ECG today  5. PAD (peripheral artery disease) (HCC) None  6. Recent history of bilateral pulmonary emboli, currently being treated with anticoagulation therapy and no complications   Current medicines are reviewed at length with the patient today.  The patient has the following concerns regarding medicines: None.  The following changes/actions have been instituted:    Diuretic therapy will now be used as needed  No specific blood testing is ordered  Labs/ tests ordered today include:  No orders of the defined types were placed in this encounter.     Disposition:   FU with HS in 9 months  Signed, Sinclair Grooms, MD  08/05/2015 4:22 PM    [facility], [place], [place]  [address] Phone: [phone]; Fax: [phone]

## 2015-08-05 NOTE — Patient Instructions (Signed)
Medication Instructions:  Your physician recommends that you continue on your current medications as directed. Please refer to the Current Medication list given to you today.   Labwork: None ordered  Testing/Procedures: None ordered  Follow-Up: Your physician wants you to follow-up in: 9 months with Dr.Smith You will receive a reminder letter in the mail two months in advance. If you don't receive a letter, please call our office to schedule the follow-up appointment.   Any Other Special Instructions Will Be Listed Below (If Applicable). If you begin to gain weight or have swelling. Please resume taking Furosemide daily, and call the office to give [place] an update    If you need a refill on your cardiac medications before your next appointment, please call your pharmacy.

## 2015-08-07 ENCOUNTER — Other Ambulatory Visit: Payer: Medicare Other

## 2015-08-07 ENCOUNTER — Ambulatory Visit: Payer: Medicare Other | Admitting: Nurse Practitioner

## 2015-08-08 ENCOUNTER — Other Ambulatory Visit (HOSPITAL_BASED_OUTPATIENT_CLINIC_OR_DEPARTMENT_OTHER): Payer: Medicare Other

## 2015-08-08 DIAGNOSIS — C61 Malignant neoplasm of prostate: Secondary | ICD-10-CM

## 2015-08-08 LAB — CBC WITH DIFFERENTIAL/PLATELET
BASO%: 0.3 % (ref 0.0–2.0)
BASOS ABS: 0 10*3/uL (ref 0.0–0.1)
EOS ABS: 0.1 10*3/uL (ref 0.0–0.5)
EOS%: 1.5 % (ref 0.0–7.0)
HCT: 33 % — ABNORMAL LOW (ref 38.4–49.9)
HGB: 10.9 g/dL — ABNORMAL LOW (ref 13.0–17.1)
LYMPH%: 19.5 % (ref 14.0–49.0)
MCH: 30.4 pg (ref 27.2–33.4)
MCHC: 33 g/dL (ref 32.0–36.0)
MCV: 91.9 fL (ref 79.3–98.0)
MONO#: 0.7 10*3/uL (ref 0.1–0.9)
MONO%: 6.7 % (ref 0.0–14.0)
NEUT#: 7.3 10*3/uL — ABNORMAL HIGH (ref 1.5–6.5)
NEUT%: 72 % (ref 39.0–75.0)
PLATELETS: 164 10*3/uL (ref 140–400)
RBC: 3.59 10*6/uL — AB (ref 4.20–5.82)
RDW: 16.2 % — ABNORMAL HIGH (ref 11.0–14.6)
WBC: 10.2 10*3/uL (ref 4.0–10.3)
lymph#: 2 10*3/uL (ref 0.9–3.3)

## 2015-08-08 LAB — COMPREHENSIVE METABOLIC PANEL
ALT: 12 U/L (ref 0–55)
ANION GAP: 7 meq/L (ref 3–11)
AST: 15 U/L (ref 5–34)
Albumin: 3.4 g/dL — ABNORMAL LOW (ref 3.5–5.0)
Alkaline Phosphatase: 52 U/L (ref 40–150)
BILIRUBIN TOTAL: 0.5 mg/dL (ref 0.20–1.20)
BUN: 23.2 mg/dL (ref 7.0–26.0)
CO2: 25 meq/L (ref 22–29)
Calcium: 9.6 mg/dL (ref 8.4–10.4)
Chloride: 105 mEq/L (ref 98–109)
Creatinine: 1.2 mg/dL (ref 0.7–1.3)
EGFR: 53 mL/min/{1.73_m2} — AB (ref >90)
Glucose: 85 mg/dl (ref 70–140)
Potassium: 4.7 mEq/L (ref 3.5–5.1)
Sodium: 137 mEq/L (ref 136–145)
TOTAL PROTEIN: 6.4 g/dL (ref 6.4–8.3)

## 2015-08-09 ENCOUNTER — Telehealth: Payer: Self-pay | Admitting: Oncology

## 2015-08-09 ENCOUNTER — Ambulatory Visit (HOSPITAL_BASED_OUTPATIENT_CLINIC_OR_DEPARTMENT_OTHER): Payer: Medicare Other | Admitting: Nurse Practitioner

## 2015-08-09 VITALS — BP 148/71 | HR 69 | Temp 97.7°F | Resp 17 | Ht 70.0 in | Wt 225.3 lb

## 2015-08-09 DIAGNOSIS — C61 Malignant neoplasm of prostate: Secondary | ICD-10-CM

## 2015-08-09 DIAGNOSIS — I82402 Acute embolism and thrombosis of unspecified deep veins of left lower extremity: Secondary | ICD-10-CM

## 2015-08-09 DIAGNOSIS — D649 Anemia, unspecified: Secondary | ICD-10-CM | POA: Diagnosis not present

## 2015-08-09 DIAGNOSIS — Z7901 Long term (current) use of anticoagulants: Secondary | ICD-10-CM | POA: Diagnosis not present

## 2015-08-09 DIAGNOSIS — Z8572 Personal history of non-Hodgkin lymphomas: Secondary | ICD-10-CM

## 2015-08-09 LAB — PSA: PROSTATE SPECIFIC AG, SERUM: 3.4 ng/mL (ref 0.0–4.0)

## 2015-08-09 LAB — PSA (PARALLEL TESTING): PSA: 3.31 ng/mL (ref <=4.00)

## 2015-08-09 NOTE — Progress Notes (Signed)
[place] OFFICE PROGRESS NOTE   Diagnosis:  Prostate cancer, non-Hodgkin's lymphoma  INTERVAL HISTORY:   Edward Woodward returns for follow-up. He continues abiraterone/prednisone. He denies nausea/vomiting. Bowels moving regularly. He has stable mild intermittent lower back pain. No new areas of pain. He reports a good appetite. He continues to gain weight.  Objective:  Vital signs in last 24 hours:  Blood pressure 148/71, pulse 69, temperature 97.7 F (36.5 C), temperature source Oral, resp. rate 17, height 5\' 10"  (1.778 m), weight 225 lb 4.8 oz (102.195 kg), SpO2 98 %.    HEENT: No thrush or ulcers. Lymphatics: No cervical, supra clavicular, axillary or inguinal lymph nodes. Resp: Lungs clear bilaterally. Cardio: Regular rate and rhythm. GI: Abdomen soft and nontender. No organomegaly. No mass. Vascular: Trace lower leg edema bilaterally.    Lab Results:  Lab Results  Component Value Date   WBC 10.2 08/08/2015   HGB 10.9* 08/08/2015   HCT 33.0* 08/08/2015   MCV 91.9 08/08/2015   PLT 164 08/08/2015   NEUTROABS 7.3* 08/08/2015   PSA 3.31 and 3.4 (parallel testing)  Imaging:  No results Woodward.  Medications: I have reviewed the patient's current medications.  Assessment/Plan: 1. Non-Hodgkin's lymphoma (follicular grade 3 lymphoma) - status post 6 cycles of Cytoxan/prednisone/rituximab 07/01/2007 through 10/21/2007. He remains in clinical remission. 2. CT chest 04/01/2015 with no lymphadenopathy  CT abdomen/pelvis 05/01/2015 with mild progression of abdomen/pelvic lymphadenopathy 3. Prostate cancer - he has been treated with hormonal therapy, the PSA was stable on 10/15/2014  PSA increased 04/26/2015   Bone scan 05/09/2015 with unchanged increased activity in the thoracic and lumbar spine felt to most likely be degenerative.   Initiation of Abiraterone and prednisone 05/11/2015. 4. History of a normocytic anemia secondary to non-Hodgkin's  lymphoma, chemotherapy, and renal insufficiency - stable  5. History of Mild thrombocytopenia  6. History of bilateral hydronephrosis. Renal ultrasound 09/26/2014 consistent with medical renal disease. No hydronephrosis. 7. Coronary artery disease - status post coronary artery stent placement. 8. History of noncalcified lung nodules. 9. History of severe neutropenia July 2009 - likely related to delayed toxicity from rituximab. 10. Macular degeneration. 11. Right middle lobe density on a CT of the chest 06/18/2010 measuring 2.5 x 1.6 cm with mildly increased metabolic activity (SUV max 2.5) on a PET scan 06/27/2010. The area of increased density was less confluent and appeared smaller on a restaging CT 09/08/2010. Right middle lobe "scarring" with no new or suspicious pulmonary nodule on a CT 08/04/2011. 12. Mild increased metabolic activity associated with several lymph nodes on the PET scan 06/27/2010 - likely related to lymphoma. No lymphadenopathy in the mediastinum or axillary areas on the CT 08/04/2011. 13. Right hip discomfort-he received a steroid injection by his primary physician without improvement. He saw Dr. Collier Salina and there was a question of a lytic process in the right pelvis. A bone scan on 09/21/2012 revealed no evidence of metastatic disease. 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 Renal insufficiency 15. Left leg DVT and pulmonary embolism 04/01/2015-maintained on xarelto 16. Hypercalcemia-status post Zometa 04/26/2015, improved     Disposition: [name] appears stable. The PSA remains in normal range. The plan is to continue abiraterone/prednisone.  He continues to gain weight. We will decrease prednisone to 5 mg daily.  He will return for a follow-up visit and labs in one month. He will contact the office in the interim with any problems.    [name] ANP/GNP-BC  08/09/2015  11:13 AM

## 2015-08-09 NOTE — Telephone Encounter (Signed)
Gave patient avs report and appointments for Feb.

## 2015-08-23 ENCOUNTER — Inpatient Hospital Stay (HOSPITAL_COMMUNITY)
Admission: EM | Admit: 2015-08-23 | Discharge: 2015-08-28 | DRG: 280 | Disposition: A | Discharge status: Home / Self Care | Payer: Medicare Other | Attending: Cardiovascular Disease | Admitting: Cardiovascular Disease

## 2015-08-23 ENCOUNTER — Emergency Department (HOSPITAL_COMMUNITY): Payer: Medicare Other

## 2015-08-23 ENCOUNTER — Encounter (HOSPITAL_COMMUNITY): Payer: Self-pay | Admitting: Emergency Medicine

## 2015-08-23 ENCOUNTER — Telehealth: Payer: Self-pay

## 2015-08-23 DIAGNOSIS — G4733 Obstructive sleep apnea (adult) (pediatric): Secondary | ICD-10-CM | POA: Diagnosis not present

## 2015-08-23 DIAGNOSIS — F329 Major depressive disorder, single episode, unspecified: Secondary | ICD-10-CM | POA: Diagnosis present

## 2015-08-23 DIAGNOSIS — R0602 Shortness of breath: Secondary | ICD-10-CM | POA: Diagnosis not present

## 2015-08-23 DIAGNOSIS — R079 Chest pain, unspecified: Secondary | ICD-10-CM | POA: Diagnosis not present

## 2015-08-23 DIAGNOSIS — R918 Other nonspecific abnormal finding of lung field: Secondary | ICD-10-CM | POA: Diagnosis present

## 2015-08-23 DIAGNOSIS — Z9989 Dependence on other enabling machines and devices: Secondary | ICD-10-CM

## 2015-08-23 DIAGNOSIS — D123 Benign neoplasm of transverse colon: Secondary | ICD-10-CM | POA: Diagnosis present

## 2015-08-23 DIAGNOSIS — C61 Malignant neoplasm of prostate: Secondary | ICD-10-CM | POA: Diagnosis present

## 2015-08-23 DIAGNOSIS — I5032 Chronic diastolic (congestive) heart failure: Secondary | ICD-10-CM

## 2015-08-23 DIAGNOSIS — K219 Gastro-esophageal reflux disease without esophagitis: Secondary | ICD-10-CM | POA: Diagnosis present

## 2015-08-23 DIAGNOSIS — R0789 Other chest pain: Secondary | ICD-10-CM | POA: Diagnosis not present

## 2015-08-23 DIAGNOSIS — Z7901 Long term (current) use of anticoagulants: Secondary | ICD-10-CM

## 2015-08-23 DIAGNOSIS — D500 Iron deficiency anemia secondary to blood loss (chronic): Secondary | ICD-10-CM | POA: Insufficient documentation

## 2015-08-23 DIAGNOSIS — G629 Polyneuropathy, unspecified: Secondary | ICD-10-CM | POA: Diagnosis not present

## 2015-08-23 DIAGNOSIS — Z7952 Long term (current) use of systemic steroids: Secondary | ICD-10-CM

## 2015-08-23 DIAGNOSIS — E7800 Pure hypercholesterolemia, unspecified: Secondary | ICD-10-CM | POA: Diagnosis present

## 2015-08-23 DIAGNOSIS — D120 Benign neoplasm of cecum: Secondary | ICD-10-CM | POA: Diagnosis present

## 2015-08-23 DIAGNOSIS — Z881 Allergy status to other antibiotic agents status: Secondary | ICD-10-CM

## 2015-08-23 DIAGNOSIS — I5022 Chronic systolic (congestive) heart failure: Secondary | ICD-10-CM

## 2015-08-23 DIAGNOSIS — I255 Ischemic cardiomyopathy: Secondary | ICD-10-CM | POA: Diagnosis present

## 2015-08-23 DIAGNOSIS — Z951 Presence of aortocoronary bypass graft: Secondary | ICD-10-CM

## 2015-08-23 DIAGNOSIS — Z86711 Personal history of pulmonary embolism: Secondary | ICD-10-CM

## 2015-08-23 DIAGNOSIS — I2510 Atherosclerotic heart disease of native coronary artery without angina pectoris: Secondary | ICD-10-CM

## 2015-08-23 DIAGNOSIS — Z8249 Family history of ischemic heart disease and other diseases of the circulatory system: Secondary | ICD-10-CM

## 2015-08-23 DIAGNOSIS — Z45018 Encounter for adjustment and management of other part of cardiac pacemaker: Secondary | ICD-10-CM

## 2015-08-23 DIAGNOSIS — I214 Non-ST elevation (NSTEMI) myocardial infarction: Secondary | ICD-10-CM | POA: Insufficient documentation

## 2015-08-23 DIAGNOSIS — I25110 Atherosclerotic heart disease of native coronary artery with unstable angina pectoris: Secondary | ICD-10-CM | POA: Diagnosis present

## 2015-08-23 DIAGNOSIS — Z79899 Other long term (current) drug therapy: Secondary | ICD-10-CM

## 2015-08-23 DIAGNOSIS — K644 Residual hemorrhoidal skin tags: Secondary | ICD-10-CM | POA: Diagnosis present

## 2015-08-23 DIAGNOSIS — Z9221 Personal history of antineoplastic chemotherapy: Secondary | ICD-10-CM

## 2015-08-23 DIAGNOSIS — Z880 Allergy status to penicillin: Secondary | ICD-10-CM

## 2015-08-23 DIAGNOSIS — I5023 Acute on chronic systolic (congestive) heart failure: Secondary | ICD-10-CM | POA: Diagnosis not present

## 2015-08-23 DIAGNOSIS — K648 Other hemorrhoids: Secondary | ICD-10-CM | POA: Diagnosis present

## 2015-08-23 DIAGNOSIS — H3530 Unspecified macular degeneration: Secondary | ICD-10-CM | POA: Diagnosis present

## 2015-08-23 DIAGNOSIS — Z955 Presence of coronary angioplasty implant and graft: Secondary | ICD-10-CM

## 2015-08-23 DIAGNOSIS — Z950 Presence of cardiac pacemaker: Secondary | ICD-10-CM

## 2015-08-23 DIAGNOSIS — K449 Diaphragmatic hernia without obstruction or gangrene: Secondary | ICD-10-CM | POA: Diagnosis present

## 2015-08-23 DIAGNOSIS — I10 Essential (primary) hypertension: Secondary | ICD-10-CM | POA: Diagnosis present

## 2015-08-23 DIAGNOSIS — E785 Hyperlipidemia, unspecified: Secondary | ICD-10-CM | POA: Diagnosis present

## 2015-08-23 DIAGNOSIS — Z86718 Personal history of other venous thrombosis and embolism: Secondary | ICD-10-CM

## 2015-08-23 DIAGNOSIS — I2699 Other pulmonary embolism without acute cor pulmonale: Secondary | ICD-10-CM | POA: Diagnosis present

## 2015-08-23 DIAGNOSIS — I25810 Atherosclerosis of coronary artery bypass graft(s) without angina pectoris: Secondary | ICD-10-CM | POA: Diagnosis present

## 2015-08-23 LAB — CBC WITH DIFFERENTIAL/PLATELET
BASOS PCT: 1 %
Basophils Absolute: 0.1 10*3/uL (ref 0.0–0.1)
EOS ABS: 0.2 10*3/uL (ref 0.0–0.7)
Eosinophils Relative: 3 %
HEMATOCRIT: 29.2 % — AB (ref 39.0–52.0)
HEMOGLOBIN: 9.1 g/dL — AB (ref 13.0–17.0)
LYMPHS ABS: 1.9 10*3/uL (ref 0.7–4.0)
Lymphocytes Relative: 31 %
MCH: 30.3 pg (ref 26.0–34.0)
MCHC: 31.2 g/dL (ref 30.0–36.0)
MCV: 97.3 fL (ref 78.0–100.0)
MONOS PCT: 8 %
Monocytes Absolute: 0.5 10*3/uL (ref 0.1–1.0)
NEUTROS ABS: 3.6 10*3/uL (ref 1.7–7.7)
NEUTROS PCT: 57 %
Platelets: 178 10*3/uL (ref 150–400)
RBC: 3 MIL/uL — AB (ref 4.22–5.81)
RDW: 15.7 % — ABNORMAL HIGH (ref 11.5–15.5)
WBC: 6.3 10*3/uL (ref 4.0–10.5)

## 2015-08-23 LAB — COMPREHENSIVE METABOLIC PANEL
ALBUMIN: 3.2 g/dL — AB (ref 3.5–5.0)
ALK PHOS: 48 U/L (ref 38–126)
ALT: 14 U/L — AB (ref 17–63)
ANION GAP: 10 (ref 5–15)
AST: 20 U/L (ref 15–41)
BILIRUBIN TOTAL: 1.3 mg/dL — AB (ref 0.3–1.2)
BUN: 27 mg/dL — AB (ref 6–20)
CALCIUM: 9.6 mg/dL (ref 8.9–10.3)
CO2: 28 mmol/L (ref 22–32)
CREATININE: 1.53 mg/dL — AB (ref 0.61–1.24)
Chloride: 103 mmol/L (ref 101–111)
GFR calc Af Amer: 46 mL/min — ABNORMAL LOW (ref >60)
GFR calc non Af Amer: 39 mL/min — ABNORMAL LOW (ref >60)
GLUCOSE: 114 mg/dL — AB (ref 65–99)
Potassium: 4.2 mmol/L (ref 3.5–5.1)
SODIUM: 141 mmol/L (ref 135–145)
TOTAL PROTEIN: 6.1 g/dL — AB (ref 6.5–8.1)

## 2015-08-23 LAB — TROPONIN I
Troponin I: <0.03 ng/mL (ref <0.031)
Troponin I: <0.03 ng/mL (ref <0.031)

## 2015-08-23 LAB — I-STAT TROPONIN, ED: Troponin i, poc: 0.02 ng/mL (ref 0.00–0.08)

## 2015-08-23 MED ORDER — ASPIRIN 81 MG PO CHEW: 324.0000 mg | CHEWABLE_TABLET | Freq: Once | ORAL | Status: DC

## 2015-08-23 MED ORDER — NITROGLYCERIN 0.4 MG SL SUBL
0.4000 mg | SUBLINGUAL_TABLET | SUBLINGUAL | Status: AC | PRN
  Administered 2015-08-23 – 2015-08-26 (×3): 0.4 mg via SUBLINGUAL
  Filled 2015-08-23: qty 1

## 2015-08-23 MED ORDER — MORPHINE SULFATE (PF) 4 MG/ML IV SOLN: 4.0000 mg | INTRAVENOUS | Status: DC | PRN

## 2015-08-23 MED ORDER — MORPHINE SULFATE (PF) 4 MG/ML IV SOLN
4.0000 mg | Freq: Once | INTRAVENOUS | Status: AC
Start: 2015-08-23 — End: 2015-08-23
  Administered 2015-08-23: 4 mg via INTRAVENOUS
  Filled 2015-08-23: qty 1

## 2015-08-23 NOTE — ED Notes (Signed)
MD at bedside. 

## 2015-08-23 NOTE — Telephone Encounter (Signed)
Pt has not been feeling well, and has been weak for last 3-4 days. Today he is feeling funny in his head, dizzy, and SOB just sitting there. O2 sat is 97% at present. BP is 109/68. (he runs 140's and 150's at [facility]) No UE involvement. He was feeling like he would faint when standing up. Sitting on side of bed he does not feel that way. He has not been eating or drinking much. Denies nausea, vomiting, diarrhea, constipation. He admits to mild chest pressure when asked.  He does have nitro sl and told her to give him that.  S/w lisa thomas NP and she said to take him to ER. S/w wife and pt was standing up and dizzy again. The nitro did not make any difference. Told her to take him to ER and if she has any difficulty, if he is too weak and dizzy to get to car,  to call 911. Wife is in agreement.

## 2015-08-23 NOTE — ED Notes (Signed)
Pt to ER via [facility] with complaint of chest tightness and shortness of breath sudden in onset while walking around house. Pt took 2 nitro and was given 324 aspirin in route. Tightness gone at this time. No longer SOB. Pt has significant cardiac hx as well as hx of PE's. VS - 128/74, HR 71, RR 17, SpO2 95% on 2L. CBG 162.

## 2015-08-23 NOTE — ED Notes (Signed)
Pt at this time complaining of left sided chest pain described as a dull ache and radiating into left arm. MD notified.

## 2015-08-23 NOTE — ED Provider Notes (Signed)
CSN: [id number]     Arrival date & time 08/23/15  1708 History   First MD Initiated Contact with Patient 08/23/15 1712     Chief Complaint  Patient presents with  . Chest Pain  . Shortness of Breath     HPI Patient presents the emergency department complaining of chest tightness and shortness of breath while walking around the house.  He also reports that he felt lightheaded and his wife reports that he looked extremely pale.  No preceding palpitations.  Aspirin prior to arrival.  He took some nitroglycerin with partial relief.  On arrival to emergency department he is without any chest tightness.  At time my evaluation reports that the chest pressure has returned in the left chest.  Patient does have a history of coronary artery bypass grafting with the last heart catheter requiring stent in 2011.  No recent illness.  Felt fine earlier today.  Denies nausea vomiting.  Reports he had some mild lightheadedness associated with this.  He denies weakness of his arms or legs.  No headache at this time.   Past Medical History  Diagnosis Date  . Cancer ([place])   . Prostate cancer ([place])   . Neuropathy ([place])   . Hypercholesteremia   . Depressive disorder, not elsewhere classified   . Angina pectoris ([place])   . Sinoatrial node dysfunction (HCC)   . Degeneration of lumbar or lumbosacral intervertebral disc   . Dyspnea   . CAD (coronary artery disease)      NYHA Class 1B, CABG 1985  . HTN (hypertension)   . OSA on CPAP   . Collagen vascular disease ([place])   . Complication of anesthesia     " I get confused afterwords  "  . PE (pulmonary embolism) 03/2015  . Neuromuscular disorder (HCC)     neuropathy  . GERD (gastroesophageal reflux disease)   . Presence of permanent cardiac pacemaker    Past Surgical History  Procedure Laterality Date  . Coronary artery bypass graft    . Exploratory laparotomy    . Endarterectomy    . Rotator cuff repair    . Lumbar fusion    . Coronary angioplasty with  stent placement      s/p bare metal stent implant SVG-diagonal 11/23/05, s/p BM stent implantation SVG diagonal 10/22/06 (feeds LAD), and 05/2010 with DES to left circumflex  . Pacemaker insertion     Family History  Problem Relation Age of Onset  . Heart disease Father    Social History  Substance Use Topics  . Smoking status: Never Smoker   . Smokeless tobacco: Never Used  . Alcohol Use: No    Review of Systems  All other systems reviewed and are negative.     Allergies  Penicillins and Simvastatin  Home Medications   Prior to Admission medications   Medication Sig Start Date End Date Taking? Authorizing Provider  abiraterone Acetate (ZYTIGA) 250 MG tablet Take 4 tablets (1,000 mg total) by mouth daily. Take on an empty stomach 1 hour before or 2 hours after a meal 05/06/15  Yes Ladell Pier, MD  atorvastatin (LIPITOR) 20 MG tablet Take 20 mg by mouth every other day.   Yes Historical Provider, MD  b complex vitamins tablet Take 1 tablet by mouth daily.     Yes Historical Provider, MD  Cholecalciferol (VITAMIN D3) 2000 UNITS TABS Take 2 tablets by mouth daily.    Yes Historical Provider, MD  citalopram (CELEXA) 20 MG tablet  Take 20 mg by mouth daily.     Yes Historical Provider, MD  Cyanocobalamin (VITAMIN B-12 CR) 1000 MCG TBCR Take 1 tablet by mouth daily. 04/08/10  Yes Historical Provider, MD  furosemide (LASIX) 40 MG tablet Take one to two (1-2) tablets (40 mg to 80 mg total) by mouth daily as needed for edema.   Yes Historical Provider, MD  gabapentin (NEURONTIN) 300 MG capsule Take 300 mg by mouth 2 (two) times daily. 2 times daily w/ additional dose PRN, not to exceed TID   Yes Historical Provider, MD  Melatonin 3 MG TABS Take 3 mg by mouth at bedtime.   Yes Historical Provider, MD  metoprolol succinate (TOPROL-XL) 25 MG 24 hr tablet TAKE ONE TABLET BY MOUTH ONCE DAILY 06/14/15  Yes Dayna N Dunn, PA-C  Multiple Vitamin (MULTIVITAMIN) capsule Take 1 capsule by mouth  daily.   Yes Historical Provider, MD  nitroGLYCERIN (NITROLINGUAL) 0.4 MG/SPRAY spray Place 1 spray under the tongue every 5 (five) minutes x 3 doses as needed for chest pain. 04/04/14  Yes Belva Crome, MD  Polyethyl Glycol-Propyl Glycol (SYSTANE) 0.4-0.3 % SOLN Apply 1 drop to eye at bedtime.   Yes Historical Provider, MD  predniSONE (DELTASONE) 5 MG tablet Take 1 tablet (5 mg total) by mouth 2 (two) times daily. Patient taking differently: Take 5 mg by mouth daily with breakfast.  05/06/15  Yes Ladell Pier, MD  UBIQUINOL PO Take 1 tablet by mouth daily. CoQ10   Yes Historical Provider, MD  XARELTO 20 MG TABS tablet Take 20 mg by mouth daily. 04/25/15  Yes Historical Provider, MD   BP 160/85 mmHg  Pulse 110  Resp 19  SpO2 98% Physical Exam  Constitutional: He is oriented to person, place, and time. He appears well-developed and well-nourished.  HENT:  Head: Normocephalic and atraumatic.  Eyes: EOM are normal.  Neck: Normal range of motion.  Cardiovascular: Normal rate, regular rhythm, normal heart sounds and intact distal pulses.   Pulmonary/Chest: Effort normal and breath sounds normal. No respiratory distress.  Abdominal: Soft. He exhibits no distension. There is no tenderness.  Musculoskeletal: Normal range of motion.  Neurological: He is alert and oriented to person, place, and time.  Skin: Skin is warm and dry.  Psychiatric: He has a normal mood and affect. Judgment normal.  Nursing note and vitals reviewed.   ED Course  Procedures (including critical care time) Labs Review Labs Reviewed  CBC WITH DIFFERENTIAL/PLATELET - Abnormal; Notable for the following:    RBC 3.00 (*)    Hemoglobin 9.1 (*)    HCT 29.2 (*)    RDW 15.7 (*)    All other components within normal limits  COMPREHENSIVE METABOLIC PANEL - Abnormal; Notable for the following:    Glucose, Bld 114 (*)    BUN 27 (*)    Creatinine, Ser 1.53 (*)    Total Protein 6.1 (*)    Albumin 3.2 (*)    ALT 14 (*)     Total Bilirubin 1.3 (*)    GFR calc non Af Amer 39 (*)    GFR calc Af Amer 46 (*)    All other components within normal limits  TROPONIN I  TROPONIN I  I-STAT TROPOININ, ED    Imaging Review Dg Chest Portable 1 View  08/23/2015  CLINICAL DATA:  Chest tightness, shortness of breath for 1 day EXAM: PORTABLE CHEST 1 VIEW COMPARISON:  03/31/2015 FINDINGS: Prior CABG. Left pacer in stable position. Heart and  mediastinal contours are within normal limits. No focal opacities or effusions. No acute bony abnormality. IMPRESSION: No active cardiopulmonary disease. Electronically Signed   By: Rolm Baptise M.D.   On: 08/23/2015 17:57   I have personally reviewed and evaluated these images and lab results as part of my medical decision-making.   EKG Interpretation   Date/Time:  Friday August 23 2015 17:59:03 EST Ventricular Rate:  107 PR Interval:  170 QRS Duration: 107 QT Interval:  348 QTC Calculation: 464 R Axis:   -16 Text Interpretation:  Sinus tachycardia Borderline left axis deviation  Anterior infarct, old Repol abnrm suggests ischemia, anterolateral  nonspecific ST changes laterally Confirmed by Tryson Lumley  MD, Lennette Bihari ([id number]) on  08/23/2015 7:00:41 PM      MDM   Final diagnoses:  Chest pain, unspecified chest pain type    Patient is seen and evaluated by cardiology.  Patient be admitted the hospital on the cardiology service.  Cardiology requests heparin drip.  They also request interrogation of his Product/process development scientist.  Both will be completed.    Jola Schmidt, MD 08/24/15 [phone]

## 2015-08-23 NOTE — ED Notes (Signed)
Provided patient with crackers and peanut butter per MD.

## 2015-08-23 NOTE — ED Notes (Signed)
Admitting MD at bedside.

## 2015-08-23 NOTE — H&P (Addendum)
History & Physical    Patient ID: Edward Woodward MRN: [id number], DOB/AGE: 1926/11/05   Admit date: 08/23/2015 Primary Physician: Mathews Argyle, MD Primary Cardiologist: Daneen Schick, MD  CC:  CP  Patient Profile    82 yo M pt of Dr. Daneen Schick w a h/o CAD s/p CABG (1995) s/p subsequent PCI's (BMS to SVG-Diag 2007, BMS to SVG-Diag 2008, DES to LCx 2011), SSS s/p PPM, HTN, HLD, CKD (baseline Cr 1.2-1.5), Non-Hodgkin's lymphoma (s/p Chemo 2008-2009), prostate cancer (on Abiraterone & Prednisone since 04/2015), PE (bilatreral diagnosed 03/2015, started Xarelto, stopped Clopidogrel), & OSA on CPAP presented today at 16:13 pm by EMS with 7/10, non-radiating chest tightness that started ~ 30 minutes prior while ambulating in his home.  He had associated lightheadedness & dyspnea.  He took NTG x 2, & his pain gradually resolved by the time he arrived to the ED.  He was given ASA 324 per EMS. In the ED, he had a recurrent episode of pain, though it radiated into his left arm that time, which resolved with NTG x 1 & Morphine 4 mg IV.  Overall, this felt similar to his prior angina.  He did not experience chest discomfort with his recent pulmonary emboli, only dyspnea.  Notably in the preceding couple of days, he has had increased weakness, fatigue, dyspnea, & lower extremity swelling.  He denied orthopnea or paroxysmal nocturnal dyspnea.  His wife had thought that this fatigue might be related to reduction in his Prednisone from 5 mg PO BID to daily 1.5 weeks prior due to significant weight gain.  He did not have the swelling at that time, having started only a couple of days ago.  Prior to tonight, he had not experienced CP in over a year.  He does not regularly take NTG.  At baseline, he occasionally exercises in his home on an exercise bike, though he has not done this regularly since Christmas.  While in the ED, he received NTG SL x 1, Morphine 4 mg IV, & ASA 325.  Relevant Data - Labs:  Troponin  negative x 2 (so far) - EKG:  NSR, incomplete RBBB, nonspecific ST-T abnormality stable from prior - CXR:  No active cardiopulmonary disease - Lexiscan (01/2014):  Moderate inferior wall scar & very small apical scar.  There is not significant reversible ischemia. EF 48% with mild inferior HK.    Past Medical History   Past Medical History  Diagnosis Date  . Cancer ([place])   . Prostate cancer ([place])   . Neuropathy ([place])   . Hypercholesteremia   . Depressive disorder, not elsewhere classified   . Angina pectoris ([place])   . Sinoatrial node dysfunction (H[facility])   . Degeneration of lumbar or lumbosacral intervertebral disc   . Dyspnea   . CAD (coronary artery disease)      NYHA Class 1B, CABG 1985  . HTN (hypertension)   . OSA on CPAP   . Collagen vascular disease ([place])   . Complication of anesthesia     " I get confused afterwords  "  . PE (pulmonary embolism) 03/2015  . Neuromuscular disorder (HCC)     neuropathy  . GERD (gastroesophageal reflux disease)   . Presence of permanent cardiac pacemaker     Past Surgical History  Procedure Laterality Date  . Coronary artery bypass graft    . Exploratory laparotomy    . Endarterectomy    . Rotator cuff repair    . Lumbar fusion    .  Coronary angioplasty with stent placement      s/p bare metal stent implant SVG-diagonal 11/23/05, s/p BM stent implantation SVG diagonal 10/22/06 (feeds LAD), and 05/2010 with DES to left circumflex  . Pacemaker insertion      Allergies  Allergies  Allergen Reactions  . Penicillins Swelling  . Simvastatin Other (See Comments)    Muscle weakness in legs ?   Home Medications    Prior to Admission medications   Medication Sig Start Date End Date Taking? Authorizing Provider  abiraterone Acetate (ZYTIGA) 250 MG tablet Take 4 tablets (1,000 mg total) by mouth daily. Take on an empty stomach 1 hour before or 2 hours after a meal 05/06/15  Yes Ladell Pier, MD  atorvastatin (LIPITOR) 20 MG tablet Take 20  mg by mouth every other day.   Yes Historical Provider, MD  b complex vitamins tablet Take 1 tablet by mouth daily.     Yes Historical Provider, MD  Cholecalciferol (VITAMIN D3) 2000 UNITS TABS Take 2 tablets by mouth daily.    Yes Historical Provider, MD  citalopram (CELEXA) 20 MG tablet Take 20 mg by mouth daily.     Yes Historical Provider, MD  Cyanocobalamin (VITAMIN B-12 CR) 1000 MCG TBCR Take 1 tablet by mouth daily. 04/08/10  Yes Historical Provider, MD  furosemide (LASIX) 40 MG tablet Take one to two (1-2) tablets (40 mg to 80 mg total) by mouth daily as needed for edema.   Yes Historical Provider, MD  gabapentin (NEURONTIN) 300 MG capsule Take 300 mg by mouth 2 (two) times daily. 2 times daily w/ additional dose PRN, not to exceed TID   Yes Historical Provider, MD  Melatonin 3 MG TABS Take 3 mg by mouth at bedtime.   Yes Historical Provider, MD  metoprolol succinate (TOPROL-XL) 25 MG 24 hr tablet TAKE ONE TABLET BY MOUTH ONCE DAILY 06/14/15  Yes Dayna N Dunn, PA-C  Multiple Vitamin (MULTIVITAMIN) capsule Take 1 capsule by mouth daily.   Yes Historical Provider, MD  nitroGLYCERIN (NITROLINGUAL) 0.4 MG/SPRAY spray Place 1 spray under the tongue every 5 (five) minutes x 3 doses as needed for chest pain. 04/04/14  Yes Belva Crome, MD  Polyethyl Glycol-Propyl Glycol (SYSTANE) 0.4-0.3 % SOLN Apply 1 drop to eye at bedtime.   Yes Historical Provider, MD  predniSONE (DELTASONE) 5 MG tablet Take 1 tablet (5 mg total) by mouth 2 (two) times daily. Patient taking differently: Take 5 mg by mouth daily with breakfast.  05/06/15  Yes Ladell Pier, MD  UBIQUINOL PO Take 1 tablet by mouth daily. CoQ10   Yes Historical Provider, MD  XARELTO 20 MG TABS tablet Take 20 mg by mouth daily. 04/25/15  Yes Historical Provider, MD    Family History    Family History  Problem Relation Age of Onset  . Heart disease Father 51   Social History    Social History   Social History  . Marital Status: Married     Spouse Name: N/A  . Number of Children: 3  . Years of Education: N/A   Occupational History  . Retired Optometrist    Social History Main Topics  . Smoking status: Never Smoker   . Smokeless tobacco: Never Used  . Alcohol Use: No  . Drug Use: No  . Sexual Activity: Not on file   Other Topics Concern  . Not on file   Social History Narrative    Review of Systems    General:  No  chills, fever, night sweats or weight changes.  Cardiovascular:  No chest pain, dyspnea on exertion, edema, orthopnea, palpitations, paroxysmal nocturnal dyspnea. Dermatological: No rash, lesions/masses Respiratory: No cough, dyspnea Urologic: No hematuria, dysuria Abdominal:   No nausea, vomiting, diarrhea, bright red blood per rectum, melena, or hematemesis Neurologic:  No visual changes, wkns, changes in mental status. All other systems reviewed and are otherwise negative except as noted above.  Physical Exam    Blood pressure 122/50, pulse 61, temperature 97.7 F (36.5 C), temperature source Oral, resp. rate 18, height 5\' 10"  (1.778 m), weight 102.2 kg (225 lb 5 oz), SpO2 96 %.  General: Pleasant, NAD Psych: Normal affect. Neuro: Alert and oriented X 3. Moves all extremities spontaneously. HEENT: Normal  Neck: Supple without bruits or JVD. Lungs:  Resp regular and unlabored, CTA. Heart: RRR no s3, s4, or murmurs. Abdomen: Soft, non-tender, non-distended, BS + x 4.  Extremities: No clubbing, cyanosis or edema. DP/PT/Radials 2+ and equal bilaterally.  Labs    Troponin [facility] Test)  Recent Labs  08/23/15 1815  TROPIPOC 0.02    Recent Labs  08/23/15 1810 08/23/15 2004  TROPONINI <0.03 <0.03   Lab Results  Component Value Date   WBC 6.3 08/23/2015   HGB 9.1* 08/23/2015   HCT 29.2* 08/23/2015   MCV 97.3 08/23/2015   PLT 178 08/23/2015    Recent Labs Lab 08/23/15 1810  NA 141  K 4.2  CL 103  CO2 28  BUN 27*  CREATININE 1.53*  CALCIUM 9.6  PROT 6.1*  BILITOT  1.3*  ALKPHOS 48  ALT 14*  AST 20  GLUCOSE 114*   Lab Results  Component Value Date   CHOL  06/18/2010    123        ATP III CLASSIFICATION:  <200     mg/dL   Desirable  200-239  mg/dL   Borderline High  >=240    mg/dL   High          HDL 40 06/18/2010   LDLCALC  06/18/2010    64        Total Cholesterol/HDL:CHD Risk Coronary Heart Disease Risk Table                     Men   Women  1/2 Average Risk   3.4   3.3  Average Risk       5.0   4.4  2 X Average Risk   9.6   7.1  3 X Average Risk  23.4   11.0        Use the calculated Patient Ratio above and the CHD Risk Table to determine the patient's CHD Risk.        ATP III CLASSIFICATION (LDL):  <100     mg/dL   Optimal  100-129  mg/dL   Near or Above                    Optimal  130-159  mg/dL   Borderline  160-189  mg/dL   High  >190     mg/dL   Very High   TRIG 170* 05/17/2015   Lab Results  Component Value Date   DDIMER 5.15* 03/31/2015   Radiology Studies    Dg Chest Portable 1 View  08/23/2015  CLINICAL DATA:  Chest tightness, shortness of breath for 1 day EXAM: PORTABLE CHEST 1 VIEW COMPARISON:  03/31/2015 FINDINGS: Prior CABG. Left pacer in stable position. Heart  and mediastinal contours are within normal limits. No focal opacities or effusions. No acute bony abnormality. IMPRESSION: No active cardiopulmonary disease. Electronically Signed   By: Rolm Baptise M.D.   On: 08/23/2015 17:57   Assessment & Plan    82 yo M pt of Dr. Daneen Schick w a h/o CAD s/p CABG (1995) s/p subsequent PCI's (BMS to SVG-Diag 2007, BMS to SVG-Diag 2008, DES to LCx 2011), SSS s/p PPM, HTN, HLD, CKD (baseline Cr 1.2-1.5), Non-Hodgkin's lymphoma (s/p Chemo 2008-2009), prostate cancer (on Abiraterone & Prednisone since 04/2015), PE (bilatreral diagnosed 03/2015, started Xarelto, stopped Clopidogrel), & OSA on CPAP p/w CP.  # Chest pain - Given his simultaneous discontinuation of ASA & Clopidogrel during the time of Xarelto initiation, his  stent patency is concerning.  Lower on the differential is recurrence/progression of PE (less concerning with Xarelto on board).  He has no significant troponin elevation or EKG abnormalities so far.  It was explained to the patient that we would need to be thoughtful about his workup given his current co-morbidities of metastatic prostate cancer (though PET with positive response to chemo) & recent VTE.  An additional factor to consider will be his wife's report of blood in his stool last week with Hgb now 9 compared to 11 two months prior.   - Will monitor on telemetry with serial cardiac enzymes. - Will treat for unstable angina (as occurred in ED at rest) with [facility], ASA, BB, & Statin.  - Based on above stratification for his co-morbidities, we could decide for medical management versus stress versus cath.   - In the meantime, will plan for a TTE to further explore new wall motion abnormalities or evidence of heart failure with his complaints of volume overload (also f/u BNP).    # ICM (EF 48% per Carlton Adam 01/2014) - He appears mildy volume overloaded. - Will continue his current Metoprolol & consider additional afterload reduction based on the trend of his blood pressure overnight.   - Monitor strict I/O & daily weights. - Follow-up BNP & TTE.   - Will give a single dose of Lasix 20 mg IV x 1 & monitor response.  I expect this will help his renal perfusion pressure by decreasing his CVP, & accordingly his AKI will improve.  # SSS s/p PPM - The ED was in the process of having his device interrogated to investigate whether arrhythmia contributed to his presentation.  We will be sure to follow-up on this.    # HTN - Normotensive in ED. - We will continue his home Metoprolol.    # PE - As per above, we are currently transitioning from Xarelto to Heparin.  # Prostate Cancer - We will continue his home Prednisone & Zytiga.    # OSA on CPAP  # PPx - Heparin  # Full code  Signed, Alfonso Ramus, MD 08/24/2015, 4:34 AM

## 2015-08-24 ENCOUNTER — Encounter (HOSPITAL_COMMUNITY): Payer: Self-pay | Admitting: Internal Medicine

## 2015-08-24 DIAGNOSIS — D62 Acute posthemorrhagic anemia: Secondary | ICD-10-CM | POA: Diagnosis not present

## 2015-08-24 DIAGNOSIS — I5022 Chronic systolic (congestive) heart failure: Secondary | ICD-10-CM | POA: Diagnosis not present

## 2015-08-24 DIAGNOSIS — K921 Melena: Secondary | ICD-10-CM | POA: Diagnosis not present

## 2015-08-24 DIAGNOSIS — E7800 Pure hypercholesterolemia, unspecified: Secondary | ICD-10-CM | POA: Diagnosis present

## 2015-08-24 DIAGNOSIS — I10 Essential (primary) hypertension: Secondary | ICD-10-CM | POA: Diagnosis not present

## 2015-08-24 DIAGNOSIS — Z7901 Long term (current) use of anticoagulants: Secondary | ICD-10-CM | POA: Diagnosis not present

## 2015-08-24 DIAGNOSIS — C61 Malignant neoplasm of prostate: Secondary | ICD-10-CM | POA: Diagnosis not present

## 2015-08-24 DIAGNOSIS — I248 Other forms of acute ischemic heart disease: Secondary | ICD-10-CM | POA: Diagnosis not present

## 2015-08-24 DIAGNOSIS — I209 Angina pectoris, unspecified: Secondary | ICD-10-CM

## 2015-08-24 DIAGNOSIS — R0602 Shortness of breath: Secondary | ICD-10-CM | POA: Diagnosis not present

## 2015-08-24 DIAGNOSIS — D500 Iron deficiency anemia secondary to blood loss (chronic): Secondary | ICD-10-CM | POA: Diagnosis not present

## 2015-08-24 DIAGNOSIS — N183 Chronic kidney disease, stage 3 (moderate): Secondary | ICD-10-CM | POA: Diagnosis not present

## 2015-08-24 DIAGNOSIS — Z86718 Personal history of other venous thrombosis and embolism: Secondary | ICD-10-CM | POA: Diagnosis not present

## 2015-08-24 DIAGNOSIS — D120 Benign neoplasm of cecum: Secondary | ICD-10-CM | POA: Diagnosis present

## 2015-08-24 DIAGNOSIS — I5023 Acute on chronic systolic (congestive) heart failure: Secondary | ICD-10-CM | POA: Diagnosis present

## 2015-08-24 DIAGNOSIS — I25110 Atherosclerotic heart disease of native coronary artery with unstable angina pectoris: Secondary | ICD-10-CM | POA: Diagnosis present

## 2015-08-24 DIAGNOSIS — I82402 Acute embolism and thrombosis of unspecified deep veins of left lower extremity: Secondary | ICD-10-CM | POA: Diagnosis not present

## 2015-08-24 DIAGNOSIS — K219 Gastro-esophageal reflux disease without esophagitis: Secondary | ICD-10-CM | POA: Diagnosis present

## 2015-08-24 DIAGNOSIS — Z9221 Personal history of antineoplastic chemotherapy: Secondary | ICD-10-CM | POA: Diagnosis not present

## 2015-08-24 DIAGNOSIS — I2510 Atherosclerotic heart disease of native coronary artery without angina pectoris: Secondary | ICD-10-CM | POA: Diagnosis not present

## 2015-08-24 DIAGNOSIS — K644 Residual hemorrhoidal skin tags: Secondary | ICD-10-CM | POA: Diagnosis present

## 2015-08-24 DIAGNOSIS — I5032 Chronic diastolic (congestive) heart failure: Secondary | ICD-10-CM

## 2015-08-24 DIAGNOSIS — I255 Ischemic cardiomyopathy: Secondary | ICD-10-CM | POA: Diagnosis present

## 2015-08-24 DIAGNOSIS — Z955 Presence of coronary angioplasty implant and graft: Secondary | ICD-10-CM | POA: Diagnosis not present

## 2015-08-24 DIAGNOSIS — K449 Diaphragmatic hernia without obstruction or gangrene: Secondary | ICD-10-CM | POA: Diagnosis present

## 2015-08-24 DIAGNOSIS — K5790 Diverticulosis of intestine, part unspecified, without perforation or abscess without bleeding: Secondary | ICD-10-CM | POA: Diagnosis not present

## 2015-08-24 DIAGNOSIS — Z79899 Other long term (current) drug therapy: Secondary | ICD-10-CM | POA: Diagnosis not present

## 2015-08-24 DIAGNOSIS — H3530 Unspecified macular degeneration: Secondary | ICD-10-CM | POA: Diagnosis present

## 2015-08-24 DIAGNOSIS — Z7952 Long term (current) use of systemic steroids: Secondary | ICD-10-CM | POA: Diagnosis not present

## 2015-08-24 DIAGNOSIS — E785 Hyperlipidemia, unspecified: Secondary | ICD-10-CM | POA: Diagnosis present

## 2015-08-24 DIAGNOSIS — R195 Other fecal abnormalities: Secondary | ICD-10-CM | POA: Diagnosis not present

## 2015-08-24 DIAGNOSIS — R079 Chest pain, unspecified: Secondary | ICD-10-CM | POA: Diagnosis not present

## 2015-08-24 DIAGNOSIS — Z8249 Family history of ischemic heart disease and other diseases of the circulatory system: Secondary | ICD-10-CM | POA: Diagnosis not present

## 2015-08-24 DIAGNOSIS — I214 Non-ST elevation (NSTEMI) myocardial infarction: Secondary | ICD-10-CM | POA: Diagnosis not present

## 2015-08-24 DIAGNOSIS — I129 Hypertensive chronic kidney disease with stage 1 through stage 4 chronic kidney disease, or unspecified chronic kidney disease: Secondary | ICD-10-CM | POA: Diagnosis not present

## 2015-08-24 DIAGNOSIS — D649 Anemia, unspecified: Secondary | ICD-10-CM | POA: Diagnosis not present

## 2015-08-24 DIAGNOSIS — Z951 Presence of aortocoronary bypass graft: Secondary | ICD-10-CM | POA: Diagnosis not present

## 2015-08-24 DIAGNOSIS — K635 Polyp of colon: Secondary | ICD-10-CM | POA: Diagnosis not present

## 2015-08-24 DIAGNOSIS — G629 Polyneuropathy, unspecified: Secondary | ICD-10-CM | POA: Diagnosis not present

## 2015-08-24 DIAGNOSIS — F329 Major depressive disorder, single episode, unspecified: Secondary | ICD-10-CM | POA: Diagnosis present

## 2015-08-24 DIAGNOSIS — Z8572 Personal history of non-Hodgkin lymphomas: Secondary | ICD-10-CM | POA: Diagnosis not present

## 2015-08-24 DIAGNOSIS — G4733 Obstructive sleep apnea (adult) (pediatric): Secondary | ICD-10-CM | POA: Diagnosis present

## 2015-08-24 DIAGNOSIS — K648 Other hemorrhoids: Secondary | ICD-10-CM | POA: Diagnosis present

## 2015-08-24 DIAGNOSIS — Z950 Presence of cardiac pacemaker: Secondary | ICD-10-CM | POA: Diagnosis not present

## 2015-08-24 DIAGNOSIS — D123 Benign neoplasm of transverse colon: Secondary | ICD-10-CM | POA: Diagnosis present

## 2015-08-24 DIAGNOSIS — R918 Other nonspecific abnormal finding of lung field: Secondary | ICD-10-CM | POA: Diagnosis present

## 2015-08-24 DIAGNOSIS — Z881 Allergy status to other antibiotic agents status: Secondary | ICD-10-CM | POA: Diagnosis not present

## 2015-08-24 DIAGNOSIS — K922 Gastrointestinal hemorrhage, unspecified: Secondary | ICD-10-CM | POA: Diagnosis not present

## 2015-08-24 DIAGNOSIS — Z880 Allergy status to penicillin: Secondary | ICD-10-CM | POA: Diagnosis not present

## 2015-08-24 DIAGNOSIS — Z86711 Personal history of pulmonary embolism: Secondary | ICD-10-CM | POA: Diagnosis not present

## 2015-08-24 LAB — LIPID PANEL
CHOLESTEROL: 139 mg/dL (ref 0–200)
HDL: 32 mg/dL — ABNORMAL LOW (ref >40)
LDL Cholesterol: 80 mg/dL (ref 0–99)
Total CHOL/HDL Ratio: 4.3 RATIO
Triglycerides: 134 mg/dL (ref <150)
VLDL: 27 mg/dL (ref 0–40)

## 2015-08-24 LAB — APTT
APTT: 34 s (ref 24–37)
aPTT: 71 seconds — ABNORMAL HIGH (ref 24–37)

## 2015-08-24 LAB — BASIC METABOLIC PANEL
Anion gap: 11 (ref 5–15)
BUN: 24 mg/dL — AB (ref 6–20)
CHLORIDE: 103 mmol/L (ref 101–111)
CO2: 29 mmol/L (ref 22–32)
CREATININE: 1.41 mg/dL — AB (ref 0.61–1.24)
Calcium: 9.3 mg/dL (ref 8.9–10.3)
GFR calc Af Amer: 50 mL/min — ABNORMAL LOW (ref >60)
GFR calc non Af Amer: 44 mL/min — ABNORMAL LOW (ref >60)
Glucose, Bld: 132 mg/dL — ABNORMAL HIGH (ref 65–99)
Potassium: 3.7 mmol/L (ref 3.5–5.1)
Sodium: 143 mmol/L (ref 135–145)

## 2015-08-24 LAB — TROPONIN I
TROPONIN I: 0.27 ng/mL — AB (ref <0.031)
TROPONIN I: 0.21 ng/mL — AB (ref <0.031)
Troponin I: 0.18 ng/mL — ABNORMAL HIGH (ref <0.031)

## 2015-08-24 LAB — CBC
HEMATOCRIT: 27.2 % — AB (ref 39.0–52.0)
HEMOGLOBIN: 8.7 g/dL — AB (ref 13.0–17.0)
MCH: 31.4 pg (ref 26.0–34.0)
MCHC: 32 g/dL (ref 30.0–36.0)
MCV: 98.2 fL (ref 78.0–100.0)
Platelets: 163 10*3/uL (ref 150–400)
RBC: 2.77 MIL/uL — AB (ref 4.22–5.81)
RDW: 15.8 % — AB (ref 11.5–15.5)
WBC: 6.1 10*3/uL (ref 4.0–10.5)

## 2015-08-24 LAB — HEPARIN LEVEL (UNFRACTIONATED)
HEPARIN UNFRACTIONATED: 0.88 [IU]/mL — AB (ref 0.30–0.70)
Heparin Unfractionated: 1 IU/mL — ABNORMAL HIGH (ref 0.30–0.70)

## 2015-08-24 LAB — BRAIN NATRIURETIC PEPTIDE: B NATRIURETIC PEPTIDE 5: 135.7 pg/mL — AB (ref 0.0–100.0)

## 2015-08-24 LAB — PROTIME-INR
INR: 1.34 (ref 0.00–1.49)
PROTHROMBIN TIME: 16.7 s — AB (ref 11.6–15.2)

## 2015-08-24 MED ORDER — ASPIRIN 81 MG PO CHEW
81.0000 mg | CHEWABLE_TABLET | Freq: Every day | ORAL | Status: DC
  Administered 2015-08-24 – 2015-08-26 (×3): 81 mg via ORAL
  Filled 2015-08-24 (×4): qty 1

## 2015-08-24 MED ORDER — NITROGLYCERIN 2 % TD OINT
0.5000 [in_us] | TOPICAL_OINTMENT | Freq: Four times a day (QID) | TRANSDERMAL | Status: DC
  Administered 2015-08-24 – 2015-08-27 (×13): 0.5 [in_us] via TOPICAL
  Filled 2015-08-24: qty 30

## 2015-08-24 MED ORDER — ONDANSETRON HCL 4 MG/2ML IJ SOLN
4.0000 mg | Freq: Four times a day (QID) | INTRAMUSCULAR | Status: DC | PRN
  Administered 2015-08-25: 4 mg via INTRAVENOUS
  Filled 2015-08-24: qty 2

## 2015-08-24 MED ORDER — HEPARIN (PORCINE) IN NACL 100-0.45 UNIT/ML-% IJ SOLN
1500.0000 [IU]/h | INTRAMUSCULAR | Status: DC
  Administered 2015-08-24 – 2015-08-25 (×2): 1500 [IU]/h via INTRAVENOUS
  Filled 2015-08-24 (×3): qty 250

## 2015-08-24 MED ORDER — MELATONIN 3 MG PO TABS: 3.0000 mg | ORAL_TABLET | Freq: Every day | ORAL | Status: DC

## 2015-08-24 MED ORDER — PREDNISONE 5 MG PO TABS
5.0000 mg | ORAL_TABLET | Freq: Every day | ORAL | Status: DC
  Administered 2015-08-24 – 2015-08-28 (×5): 5 mg via ORAL
  Filled 2015-08-24 (×5): qty 1

## 2015-08-24 MED ORDER — ABIRATERONE ACETATE 250 MG PO TABS
1000.0000 mg | ORAL_TABLET | Freq: Every day | ORAL | Status: DC
  Administered 2015-08-24 – 2015-08-25 (×2): 1000 mg via ORAL
  Filled 2015-08-24 (×3): qty 4

## 2015-08-24 MED ORDER — ONDANSETRON HCL 4 MG PO TABS: 4.0000 mg | ORAL_TABLET | Freq: Four times a day (QID) | ORAL | Status: DC | PRN

## 2015-08-24 MED ORDER — FUROSEMIDE 10 MG/ML IJ SOLN
20.0000 mg | Freq: Once | INTRAMUSCULAR | Status: AC
  Administered 2015-08-24: 20 mg via INTRAVENOUS
  Filled 2015-08-24: qty 2

## 2015-08-24 MED ORDER — METOPROLOL SUCCINATE ER 25 MG PO TB24
25.0000 mg | ORAL_TABLET | Freq: Every day | ORAL | Status: DC
  Administered 2015-08-24 – 2015-08-26 (×3): 25 mg via ORAL
  Filled 2015-08-24 (×3): qty 1

## 2015-08-24 MED ORDER — GABAPENTIN 300 MG PO CAPS
300.0000 mg | ORAL_CAPSULE | Freq: Two times a day (BID) | ORAL | Status: DC
  Administered 2015-08-24 – 2015-08-28 (×8): 300 mg via ORAL
  Filled 2015-08-24 (×8): qty 1

## 2015-08-24 MED ORDER — ATORVASTATIN CALCIUM 20 MG PO TABS
20.0000 mg | ORAL_TABLET | ORAL | Status: DC
  Administered 2015-08-24 – 2015-08-26 (×2): 20 mg via ORAL
  Filled 2015-08-24 (×2): qty 1

## 2015-08-24 MED ORDER — SODIUM CHLORIDE 0.9% FLUSH
3.0000 mL | Freq: Two times a day (BID) | INTRAVENOUS | Status: DC
  Administered 2015-08-24 – 2015-08-26 (×4): 3 mL via INTRAVENOUS

## 2015-08-24 MED ORDER — CITALOPRAM HYDROBROMIDE 20 MG PO TABS
20.0000 mg | ORAL_TABLET | Freq: Every day | ORAL | Status: DC
  Administered 2015-08-24 – 2015-08-28 (×4): 20 mg via ORAL
  Filled 2015-08-24 (×4): qty 1

## 2015-08-24 MED ORDER — POLYETHYL GLYCOL-PROPYL GLYCOL 0.4-0.3 % OP SOLN: 1.0000 [drp] | Freq: Every day | OPHTHALMIC | Status: DC

## 2015-08-24 NOTE — Progress Notes (Signed)
Pt has home CPAP and family in the room said he didn't need any assistance. RT will continue to monitor.

## 2015-08-24 NOTE — Progress Notes (Signed)
Patient Name: Edward Woodward Date of Encounter: 08/24/2015  Active Problems:   Coronary atherosclerosis of native coronary artery   Essential hypertension, benign   OSA on CPAP   Cardiac pacemaker -[employer]   Pulmonary embolus [facility])   Chest pain   Chronic systolic heart failure (HCC)   Pain in the chest   Length of Stay: 0  SUBJECTIVE  No chest pain, but mild SOB.  CURRENT MEDS . abiraterone Acetate  1,000 mg Oral Daily  . aspirin  324 mg Oral Once  . aspirin  81 mg Oral Daily  . atorvastatin  20 mg Oral QODAY  . citalopram  20 mg Oral Daily  . furosemide  20 mg Intravenous Once  . gabapentin  300 mg Oral BID  . metoprolol succinate  25 mg Oral Daily  . nitroGLYCERIN  0.5 inch Topical 4 times per day  . predniSONE  5 mg Oral Q breakfast  . sodium chloride flush  3 mL Intravenous Q12H   . heparin     OBJECTIVE  Filed Vitals:   08/24/15 0030 08/24/15 0045 08/24/15 0142 08/24/15 0431  BP: 137/55 124/58 144/55 122/50  Pulse: 68 67 72 61  Temp:   98 F (36.7 C) 97.7 F (36.5 C)  TempSrc:   Oral Oral  Resp: 18 16 18 18   Height:      Weight:      SpO2: 96% 94% 96% 96%    Intake/Output Summary (Last 24 hours) at 08/24/15 0917 Last data filed at 08/24/15 0900  Gross per 24 hour  Intake    360 ml  Output    250 ml  Net    110 ml   Filed Weights   08/24/15 0015  Weight: 225 lb 5 oz (102.2 kg)    PHYSICAL EXAM  General: Pleasant, NAD. Neuro: Alert and oriented X 3. Moves all extremities spontaneously. Psych: Normal affect. HEENT:  Normal  Neck: Supple without bruits or JVD. Lungs:  Resp regular and unlabored, CTA. Heart: RRR no s3, s4, or murmurs. Abdomen: Soft, non-tender, non-distended, BS + x 4.  Extremities: No clubbing, cyanosis or edema. DP/PT/Radials 2+ and equal bilaterally.  Accessory Clinical Findings  CBC  Recent Labs  08/23/15 1810 08/24/15 0343  WBC 6.3 6.1  NEUTROABS 3.6  --   HGB 9.1* 8.7*  HCT 29.2* 27.2*  MCV  97.3 98.2  PLT 178 [id number]   Basic Metabolic Panel  Recent Labs  08/23/15 1810  NA 141  K 4.2  CL 103  CO2 28  GLUCOSE 114*  BUN 27*  CREATININE 1.53*  CALCIUM 9.6   Liver Function Tests  Recent Labs  08/23/15 1810  AST 20  ALT 14*  ALKPHOS 48  BILITOT 1.3*  PROT 6.1*  ALBUMIN 3.2*    Recent Labs  08/23/15 1810 08/23/15 2004 08/24/15 0343  TROPONINI <0.03 <0.03 0.18*    Recent Labs  08/24/15 0343  CHOL 139  HDL 32*  LDLCALC 80  TRIG 134  CHOLHDL 4.3   Radiology/Studies  Dg Chest Portable 1 View  08/23/2015  CLINICAL DATA:  Chest tightness, shortness of breath for 1 day EXAM: PORTABLE CHEST 1 VIEW COMPARISON:  03/31/2015 FINDINGS: Prior CABG. Left pacer in stable position. Heart and mediastinal contours are within normal limits. No focal opacities or effusions. No acute bony abnormality. IMPRESSION: No active cardiopulmonary disease. Electronically Signed   By: Rolm Baptise M.D.   On: 08/23/2015 17:57   TELE: SR  ASSESSMENT AND PLAN  82 yo M pt of Dr. Daneen Schick w a h/o CAD s/p CABG (1995) s/p subsequent PCI's (BMS to SVG-Diag 2007, BMS to SVG-Diag 2008, DES to LCx 2011), SSS s/p PPM, HTN, HLD, CKD (baseline Cr 1.2-1.5), Non-Hodgkin's lymphoma (s/p Chemo 2008-2009), prostate cancer (on Abiraterone & Prednisone since 04/2015), PE (bilatreral diagnosed 03/2015, started Xarelto, stopped Clopidogrel), & OSA on CPAP p/w CP.  # Chest pain - Given his simultaneous discontinuation of ASA & Clopidogrel during the time of Xarelto initiation, his stent patency is concerning.  His troponin is now elevated at 0.03 --> 0.18 --> 0.27, however worsening anemia since started on anticoagulation 11.5 --> 9.1 --> 8.7.  We will call GI for help to evaluate the source of bleeding.  The last dose of xarelto was yesterday in the am, we will start heparin drip and d/c if overt bleeding. Possible cath on Monday if stable Hb. We would appreciate GI consult. - TTE to further explore  new wall motion abnormalities or evidence of heart failure with his complaints of volume overload is pending  # ICM (EF 48% per Carlton Adam 01/2014) - He appears mildy volume overloaded. - Will continue his current Metoprolol & consider additional afterload reduction based on the trend of his blood pressure overnight.  - Monitor strict I/O & daily weights. - Follow-up BNP & TTE.  - Will give a single dose of Lasix 20 mg IV x 1 & monitor response. I expect this will help his renal perfusion pressure by decreasing his CVP, & accordingly his AKI will improve.  # SSS s/p PPM - The ED was in the process of having his device interrogated to investigate whether arrhythmia contributed to his presentation. We will be sure to follow-up on this.   # HTN - Normotensive in ED. - We will continue his home Metoprolol.   # PE - As per above, we are currently transitioning from Xarelto to Heparin.  # Prostate Cancer - We will continue his home Prednisone & Zytiga.   # OSA on CPAP  # PPx - Heparin  # Full code    Signed, Dorothy Spark MD, [facility] 08/24/2015

## 2015-08-24 NOTE — Progress Notes (Signed)
ANTICOAGULATION CONSULT NOTE - Follow Up Consult  Pharmacy Consult for Heparin Indication: chest pain/ACS  Allergies  Allergen Reactions  . Penicillins Swelling  . Simvastatin Other (See Comments)    Muscle weakness in legs ?    Patient Measurements: Height: 5\' 10"  (177.8 cm) Weight: 225 lb 5 oz (102.2 kg) IBW/kg (Calculated) : 73 Heparin Dosing Weight: 95kg  Vital Signs: Temp: 98.1 F (36.7 C) (01/28 1353) Temp Source: Oral (01/28 1353) BP: 118/48 mmHg (01/28 1353) Pulse Rate: 70 (01/28 1353)  Labs:  Recent Labs  08/23/15 1810  08/24/15 0343 08/24/15 0830 08/24/15 1200 08/24/15 1540 08/24/15 1833  HGB 9.1*  --  8.7*  --   --   --   --   HCT 29.2*  --  27.2*  --   --   --   --   PLT 178  --  163  --   --   --   --   APTT  --   --  34  --   --   --  71*  LABPROT  --   --  16.7*  --   --   --   --   INR  --   --  1.34  --   --   --   --   HEPARINUNFRC  --   --  1.00*  --   --   --  0.88*  CREATININE 1.53*  --   --   --  1.41*  --   --   TROPONINI <0.03  < > 0.18* 0.27*  --  0.21*  --   < > = values in this interval not displayed.  Estimated Creatinine Clearance: 45.1 mL/min (by C-G formula based on Cr of 1.41).   Medications:  Heparin @ 1500 units/hr  Assessment: 88yom transitioned from xarelto to heparin this morning with plans for stress test versus cath. Last xarelto dose 1/27 @ 1100. Initial heparin level is slightly elevated indicating xarelto still on board. APTT is therapeutic at 71. Hgb low but stable. No bleeding issues.  Goal of Therapy:  APTT 66-102 seconds Heparin level 0.3-0.7 units/ml Monitor platelets by anticoagulation protocol: Yes   Plan:  1) Continue heparin at 1500 units/hr 2) Daily heparin level and aPTT  Deboraha Sprang 08/24/2015,7:45 PM

## 2015-08-24 NOTE — Consult Note (Signed)
Referring Provider:  Ena Dawley Primary Care Physician:  Mathews Argyle, MD Primary Gastroenterologist:  Dr. Cristina Gong  Reason for Consultation: Minimal hematochezia  HPI: Edward Woodward is a 82 y.o. male admitted to the hospital yesterday with chest pain and elevated troponins, in the setting of prior CAD status post CABG 1995. He is on Xarelto because of a pulmonary embolism last September. The patient is anticipated to undergo cardiac catheterization in a couple of days, but in the meantime, it was noted that he had had recent rectal bleeding so, in anticipation of possible percutaneous coronary intervention, GI evaluation was felt to be warranted.  Fortunately, it sounds as though the history of rectal bleeding is minimal. The patient has a history of perianal excoriation and, although he himself is not really aware of rectal bleeding episodes, his wife noted a small amount of blood at the bottom of the toilet bowl with a recent bowel movement. That was several days ago. No history of frank hematochezia.  The patient had a negative colonoscopy by me in 2003, none since.  Of some concern is the fact that the patient's current hemoglobin is about 2 g lower than it was back in September and about 2.5 g less than it was 6 weeks ago.  Here in the hospital, the patient is on a heparin infusion and off Xarelto. He was not on any ulcerogenic medication prior to admission, such as aspirin.     Past Medical History  Diagnosis Date  . Cancer ([place])   . Prostate cancer ([place])   . Neuropathy ([place])   . Hypercholesteremia   . Depressive disorder, not elsewhere classified   . Angina pectoris ([place])   . Sinoatrial node dysfunction ([facility])   . Degeneration of lumbar or lumbosacral intervertebral disc   . Dyspnea   . CAD (coronary artery disease)      NYHA Class 1B, CABG 1985  . HTN (hypertension)   . OSA on CPAP   . Collagen vascular disease ([place])   . Complication of anesthesia     " I  get confused afterwords  "  . PE (pulmonary embolism) 03/2015  . Neuromuscular disorder ([facility])     neuropathy  . GERD (gastroesophageal reflux disease)   . Presence of permanent cardiac pacemaker     Past Surgical History  Procedure Laterality Date  . Coronary artery bypass graft    . Exploratory laparotomy    . Endarterectomy    . Rotator cuff repair    . Lumbar fusion    . Coronary angioplasty with stent placement      s/p bare metal stent implant SVG-diagonal 11/23/05, s/p BM stent implantation SVG diagonal 10/22/06 (feeds LAD), and 05/2010 with DES to left circumflex  . Pacemaker insertion      Prior to Admission medications   Medication Sig Start Date End Date Taking? Authorizing Provider  abiraterone Acetate (ZYTIGA) 250 MG tablet Take 4 tablets (1,000 mg total) by mouth daily. Take on an empty stomach 1 hour before or 2 hours after a meal 05/06/15  Yes Ladell Pier, MD  atorvastatin (LIPITOR) 20 MG tablet Take 20 mg by mouth every other day.   Yes Historical Provider, MD  b complex vitamins tablet Take 1 tablet by mouth daily.     Yes Historical Provider, MD  Cholecalciferol (VITAMIN D3) 2000 UNITS TABS Take 2 tablets by mouth daily.    Yes Historical Provider, MD  citalopram (CELEXA) 20 MG tablet Take 20 mg by mouth  daily.     Yes Historical Provider, MD  Cyanocobalamin (VITAMIN B-12 CR) 1000 MCG TBCR Take 1 tablet by mouth daily. 04/08/10  Yes Historical Provider, MD  furosemide (LASIX) 40 MG tablet Take one to two (1-2) tablets (40 mg to 80 mg total) by mouth daily as needed for edema.   Yes Historical Provider, MD  gabapentin (NEURONTIN) 300 MG capsule Take 300 mg by mouth 2 (two) times daily. 2 times daily w/ additional dose PRN, not to exceed TID   Yes Historical Provider, MD  Melatonin 3 MG TABS Take 3 mg by mouth at bedtime.   Yes Historical Provider, MD  metoprolol succinate (TOPROL-XL) 25 MG 24 hr tablet TAKE ONE TABLET BY MOUTH ONCE DAILY 06/14/15  Yes Dayna N Dunn,  PA-C  Multiple Vitamin (MULTIVITAMIN) capsule Take 1 capsule by mouth daily.   Yes Historical Provider, MD  nitroGLYCERIN (NITROLINGUAL) 0.4 MG/SPRAY spray Place 1 spray under the tongue every 5 (five) minutes x 3 doses as needed for chest pain. 04/04/14  Yes Belva Crome, MD  Polyethyl Glycol-Propyl Glycol (SYSTANE) 0.4-0.3 % SOLN Apply 1 drop to eye at bedtime.   Yes Historical Provider, MD  predniSONE (DELTASONE) 5 MG tablet Take 1 tablet (5 mg total) by mouth 2 (two) times daily. Patient taking differently: Take 5 mg by mouth daily with breakfast.  05/06/15  Yes Ladell Pier, MD  UBIQUINOL PO Take 1 tablet by mouth daily. CoQ10   Yes Historical Provider, MD  XARELTO 20 MG TABS tablet Take 20 mg by mouth daily. 04/25/15  Yes Historical Provider, MD    Current Facility-Administered Medications  Medication Dose Route Frequency Provider Last Rate Last Dose  . abiraterone Acetate (ZYTIGA) tablet 1,000 mg  1,000 mg Oral Daily Alfonso Ramus, MD   1,000 mg at 08/24/15 1421  . aspirin chewable tablet 324 mg  324 mg Oral Once Jola Schmidt, MD   Stopped at 08/23/15 1916  . aspirin chewable tablet 81 mg  81 mg Oral Daily Alfonso Ramus, MD   81 mg at 08/24/15 1049  . atorvastatin (LIPITOR) tablet 20 mg  20 mg Oral QODAY Alfonso Ramus, MD   20 mg at 08/24/15 1047  . citalopram (CELEXA) tablet 20 mg  20 mg Oral Daily Alfonso Ramus, MD   20 mg at 08/24/15 1049  . gabapentin (NEURONTIN) capsule 300 mg  300 mg Oral BID Alfonso Ramus, MD   300 mg at 08/24/15 2128  . heparin ADULT infusion 100 units/mL (25000 units/250 mL)  1,500 Units/hr Intravenous Continuous Laren Everts, RPH 15 mL/hr at 08/24/15 1304 1,500 Units/hr at 08/24/15 1304  . metoprolol succinate (TOPROL-XL) 24 hr tablet 25 mg  25 mg Oral Daily Alfonso Ramus, MD   25 mg at 08/24/15 1048  . nitroGLYCERIN (NITROGLYN) 2 % ointment 0.5 inch  0.5 inch Topical 4 times per day Alfonso Ramus, MD   0.5 inch at 08/24/15 1822  .  nitroGLYCERIN (NITROSTAT) SL tablet 0.4 mg  0.4 mg Sublingual Q5 min PRN Jola Schmidt, MD   0.4 mg at 08/23/15 1852  . ondansetron (ZOFRAN) tablet 4 mg  4 mg Oral Q6H PRN Alfonso Ramus, MD       Or  . ondansetron [facility]) injection 4 mg  4 mg Intravenous Q6H PRN Alfonso Ramus, MD      . predniSONE (DELTASONE) tablet 5 mg  5 mg Oral Q breakfast Alfonso Ramus, MD  5 mg at 08/24/15 0845  . sodium chloride flush (NS) 0.9 % injection 3 mL  3 mL Intravenous Q12H Alfonso Ramus, MD   3 mL at 08/24/15 1047    Allergies as of 08/23/2015 - Review Complete 08/23/2015  Allergen Reaction Noted  . Penicillins Swelling 01/21/2012  . Simvastatin Other (See Comments) 09/07/2012    Family History  Problem Relation Age of Onset  . Heart disease Father 36    Social History   Social History  . Marital Status: Married    Spouse Name: N/A  . Number of Children: 3  . Years of Education: N/A   Occupational History  . Retired Optometrist    Social History Main Topics  . Smoking status: Never Smoker   . Smokeless tobacco: Never Used  . Alcohol Use: No  . Drug Use: No  . Sexual Activity: Not on file   Other Topics Concern  . Not on file   Social History Narrative    Review of Systems: Currently free of chest pain  Physical Exam: Vital signs in last 24 hours: Temp:  [97.7 F (36.5 C)-98.1 F (36.7 C)] 98.1 F (36.7 C) (01/28 2002) Pulse Rate:  [61-107] 65 (01/28 2002) Resp:  [12-23] 18 (01/28 2002) BP: (115-144)/(44-74) 115/44 mmHg (01/28 2002) SpO2:  [94 %-99 %] 95 % (01/28 2002) Weight:  [102.2 kg (225 lb 5 oz)] 102.2 kg (225 lb 5 oz) (01/28 0015) Last BM Date: 08/23/15 General:   Alert,  Well-developed, well-nourished, pleasant and cooperative in NAD, sitting on the side of the bed Head:  Normocephalic and atraumatic. Eyes:  Sclera clear, no icterus.   Conjunctiva pink. Mouth:   No ulcerations or lesions.  Oropharynx pink & moist. Neck:   No masses or  thyromegaly. Lungs:  Clear throughout to auscultation.   No wheezes, crackles, or rhonchi. No evident respiratory distress. Perhaps slight decrease in vesicular breath sounds. Heart:   Regular rate and rhythm; no murmurs, clicks, rubs,  or gallops. Abdomen:  Soft, nontender, nontympanitic, and nondistended. No masses, hepatosplenomegaly or ventral hernias noted. Rectal:  No masses. Stool is light tan in color, slightly rust tinged, no visible blood.   Msk:   Symmetrical without gross deformities. Pulses:  Normal radial pulse is noted. Extremities:   Without clubbing, cyanosis, or edema. Neurologic:  Alert and coherent;  grossly normal neurologically. Skin:  Intact without significant lesions or rashes. Cervical Nodes:  No significant cervical adenopathy. Psych:   Alert and cooperative. Normal mood and affect.  Intake/Output from previous day: 01/27 0701 - 01/28 0700 In: -  Out: 250 [Urine:250] Intake/Output this shift: Total I/O In: -  Out: 125 [Urine:125]  Lab Results:  Recent Labs  08/23/15 1810 08/24/15 0343  WBC 6.3 6.1  HGB 9.1* 8.7*  HCT 29.2* 27.2*  PLT 178 163   BMET  Recent Labs  08/23/15 1810 08/24/15 1200  NA 141 143  K 4.2 3.7  CL 103 103  CO2 28 29  GLUCOSE 114* 132*  BUN 27* 24*  CREATININE 1.53* 1.41*  CALCIUM 9.6 9.3   LFT  Recent Labs  08/23/15 1810  PROT 6.1*  ALBUMIN 3.2*  AST 20  ALT 14*  ALKPHOS 48  BILITOT 1.3*   PT/INR  Recent Labs  08/24/15 0343  LABPROT 16.7*  INR 1.34    Studies/Results: Dg Chest Portable 1 View  08/23/2015  CLINICAL DATA:  Chest tightness, shortness of breath for 1 day EXAM: PORTABLE CHEST 1 VIEW COMPARISON:  03/31/2015 FINDINGS: Prior CABG. Left pacer in stable position. Heart and mediastinal contours are within normal limits. No focal opacities or effusions. No acute bony abnormality. IMPRESSION: No active cardiopulmonary disease. Electronically Signed   By: Rolm Baptise M.D.   On: 08/23/2015 17:57     Impression: 1. Minimal hematochezia. There is history, or current sign, of clinically significant hematochezia. I suspect this was rectal outlet bleeding. 2. Normocytic anemia, with mild to moderate decline in hemoglobin over the past one to 2 months 3. Coronary disease with probable current MI 4. Chronic anticoagulation (Xarelto) because of pulmonary embolism last September  Plan: 1. Observation. Given the non-bloody appearance of the stool by current rectal examination, even on heparin infusion, I do not feel that GI evaluation is needed, and in fact, is probably inadvisable in view of possible evolving acute MI. 2. I will check iron studies and request stool Hemoccults. However, even if this patient shows evidence of occult blood loss, I am not sure he would be a good candidate for GI workup, given his cardiac status. 3. I do not see any contraindication to ongoing anticoagulation, including going back on Xarelto.  I would also not be opposed to   the use of aspirin or other antiplatelet agents, although if aspirin is used, I would favor concurrent PPI prophylaxis.   LOS: 0 days   Edward Woodward  08/24/2015, 9:36 PM   Pager [phone] If no answer or after 5 PM call [phone]

## 2015-08-24 NOTE — Progress Notes (Signed)
ANTICOAGULATION CONSULT NOTE - Initial Consult  Pharmacy Consult for heparin Indication: chest pain/ACS and pulmonary embolus  Allergies  Allergen Reactions  . Penicillins Swelling  . Simvastatin Other (See Comments)    Muscle weakness in legs ?    Patient Measurements: Height: 5\' 10"  (177.8 cm) Weight: 225 lb 5 oz (102.2 kg) IBW/kg (Calculated) : 73 Heparin Dosing Weight: 95kg  Vital Signs: BP: 122/55 mmHg (01/28 0000) Pulse Rate: 71 (01/28 0015)  Labs:  Recent Labs  08/23/15 1810 08/23/15 2004  HGB 9.1*  --   HCT 29.2*  --   PLT 178  --   CREATININE 1.53*  --   TROPONINI <0.03 <0.03    Estimated Creatinine Clearance: 41.5 mL/min (by C-G formula based on Cr of 1.53).   Medical History: Past Medical History  Diagnosis Date  . Cancer ([place])   . Prostate cancer ([place])   . Neuropathy ([place])   . Hypercholesteremia   . Depressive disorder, not elsewhere classified   . Angina pectoris ([place])   . Sinoatrial node dysfunction ([facility])   . Degeneration of lumbar or lumbosacral intervertebral disc   . Dyspnea   . CAD (coronary artery disease)      NYHA Class 1B, CABG 1985  . HTN (hypertension)   . OSA on CPAP   . Collagen vascular disease ([place])   . Complication of anesthesia     " I get confused afterwords  "  . PE (pulmonary embolism) 03/2015  . Neuromuscular disorder ([facility])     neuropathy  . GERD (gastroesophageal reflux disease)   . Presence of permanent cardiac pacemaker     Assessment: 82yo male on Xarelto for PE (03/2015) c/o chest tightness and SOB while walking around house, improved w/ NTG x2, troponins negative so far, to begin heparin.  Goal of Therapy:  Heparin level 0.3-0.7 units/ml aPTT 66-102 seconds Monitor platelets by anticoagulation protocol: Yes   Plan:  Pt took Xarelto 1/27 at 1100; will begin heparin gtt at 1100am at rate of 1500 units/hr and monitor heparin levels and PTT (Xarelto will likely affect heparin levels and will need to dose off of  PTT) and CBC.  Wynona Neat, PharmD, BCPS  08/24/2015,12:43 AM

## 2015-08-25 ENCOUNTER — Inpatient Hospital Stay (HOSPITAL_BASED_OUTPATIENT_CLINIC_OR_DEPARTMENT_OTHER): Payer: Medicare Other

## 2015-08-25 DIAGNOSIS — R0602 Shortness of breath: Secondary | ICD-10-CM

## 2015-08-25 DIAGNOSIS — R079 Chest pain, unspecified: Secondary | ICD-10-CM

## 2015-08-25 DIAGNOSIS — I248 Other forms of acute ischemic heart disease: Secondary | ICD-10-CM

## 2015-08-25 DIAGNOSIS — Z8572 Personal history of non-Hodgkin lymphomas: Secondary | ICD-10-CM

## 2015-08-25 DIAGNOSIS — D649 Anemia, unspecified: Secondary | ICD-10-CM

## 2015-08-25 DIAGNOSIS — C61 Malignant neoplasm of prostate: Secondary | ICD-10-CM

## 2015-08-25 DIAGNOSIS — I2510 Atherosclerotic heart disease of native coronary artery without angina pectoris: Secondary | ICD-10-CM

## 2015-08-25 LAB — CBC
HCT: 25.2 % — ABNORMAL LOW (ref 39.0–52.0)
Hemoglobin: 7.9 g/dL — ABNORMAL LOW (ref 13.0–17.0)
MCH: 30.7 pg (ref 26.0–34.0)
MCHC: 31.3 g/dL (ref 30.0–36.0)
MCV: 98.1 fL (ref 78.0–100.0)
PLATELETS: 161 10*3/uL (ref 150–400)
RBC: 2.57 MIL/uL — ABNORMAL LOW (ref 4.22–5.81)
RDW: 16.2 % — AB (ref 11.5–15.5)
WBC: 6.9 10*3/uL (ref 4.0–10.5)

## 2015-08-25 LAB — IRON AND TIBC
IRON: 117 ug/dL (ref 45–182)
Saturation Ratios: 40 % — ABNORMAL HIGH (ref 17.9–39.5)
TIBC: 293 ug/dL (ref 250–450)
UIBC: 176 ug/dL

## 2015-08-25 LAB — APTT: APTT: 88 s — AB (ref 24–37)

## 2015-08-25 LAB — HEPARIN LEVEL (UNFRACTIONATED): HEPARIN UNFRACTIONATED: 0.94 [IU]/mL — AB (ref 0.30–0.70)

## 2015-08-25 LAB — OCCULT BLOOD X 1 CARD TO LAB, STOOL: Fecal Occult Bld: POSITIVE — AB

## 2015-08-25 LAB — TROPONIN I: Troponin I: 0.14 ng/mL — ABNORMAL HIGH (ref <0.031)

## 2015-08-25 LAB — PREPARE RBC (CROSSMATCH)

## 2015-08-25 LAB — ABO/RH: ABO/RH(D): A POS

## 2015-08-25 LAB — FERRITIN: FERRITIN: 116 ng/mL (ref 24–336)

## 2015-08-25 MED ORDER — PANTOPRAZOLE SODIUM 40 MG PO TBEC
40.0000 mg | DELAYED_RELEASE_TABLET | Freq: Two times a day (BID) | ORAL | Status: DC
  Administered 2015-08-26 – 2015-08-28 (×6): 40 mg via ORAL
  Filled 2015-08-25 (×7): qty 1

## 2015-08-25 MED ORDER — FOLIC ACID 1 MG PO TABS
1.0000 mg | ORAL_TABLET | Freq: Every day | ORAL | Status: DC
  Administered 2015-08-26 – 2015-08-28 (×2): 1 mg via ORAL
  Filled 2015-08-25 (×2): qty 1

## 2015-08-25 MED ORDER — SODIUM CHLORIDE 0.9 % IV SOLN: INTRAVENOUS | Status: DC

## 2015-08-25 MED ORDER — ABIRATERONE ACETATE 250 MG PO TABS
1000.0000 mg | ORAL_TABLET | Freq: Every day | ORAL | Status: DC
  Administered 2015-08-26 – 2015-08-28 (×3): 1000 mg via ORAL

## 2015-08-25 MED ORDER — SODIUM CHLORIDE 0.9 % IV SOLN
INTRAVENOUS | Status: DC
  Administered 2015-08-25: 20:00:00 via INTRAVENOUS

## 2015-08-25 MED ORDER — HEPARIN (PORCINE) IN NACL 100-0.45 UNIT/ML-% IJ SOLN
1300.0000 [IU]/h | INTRAMUSCULAR | Status: DC
  Administered 2015-08-25: 1300 [IU]/h via INTRAVENOUS
  Filled 2015-08-25: qty 250

## 2015-08-25 MED ORDER — SODIUM CHLORIDE 0.9 % IV SOLN
Freq: Once | INTRAVENOUS | Status: AC
  Administered 2015-08-25: 15:00:00 via INTRAVENOUS

## 2015-08-25 MED ORDER — PERFLUTREN LIPID MICROSPHERE
INTRAVENOUS | Status: AC
  Administered 2015-08-25: 09:00:00
  Filled 2015-08-25: qty 10

## 2015-08-25 MED ORDER — PEG 3350-KCL-NA BICARB-NACL 420 G PO SOLR
4000.0000 mL | Freq: Once | ORAL | Status: AC
  Administered 2015-08-25: 4000 mL via ORAL
  Filled 2015-08-25: qty 4000

## 2015-08-25 MED ORDER — FUROSEMIDE 10 MG/ML IJ SOLN
20.0000 mg | Freq: Once | INTRAMUSCULAR | Status: AC
  Administered 2015-08-25: 20 mg via INTRAVENOUS
  Filled 2015-08-25 (×3): qty 2

## 2015-08-25 MED ORDER — ACETAMINOPHEN 500 MG PO TABS
500.0000 mg | ORAL_TABLET | Freq: Four times a day (QID) | ORAL | Status: DC | PRN
  Administered 2015-08-25 – 2015-08-26 (×2): 500 mg via ORAL
  Filled 2015-08-25 (×2): qty 1

## 2015-08-25 NOTE — Consult Note (Signed)
[facility]  [phone]) [phone] Fax:(336) [phone]     ID: Edward Woodward DOB: 1926-11-29  Edward#: [id number]  [id number]  Patient Care Team: Lajean Manes, Edward as PCP - General (Internal Medicine) Ladell Pier, Edward as Attending Physician (Hematology and Oncology) Myrlene Broker, Edward as Attending Physician (Urology) PCP: Mathews Argyle, Edward OTHER Edward:  CHIEF COMPLAINT: unstable angina, history of DVT/PE, prostate cancer  CURRENT TREATMENT: abirotarone/ prednisone; [rivaroxaban]   HISTORY OF PRESENT ILLNESS: Edward Woodward is followed by my partner Dr Benay Spice for a history of NHL dating back to 2008. He was treated w/o anthracyclines, with cytoxan, prednisone and rituximab, last treatment March 2009, and there has been no evidence of recurrence. The patient also has a history of prostate cancer, treated initially with hormone therapy. More recently Edward Woodward has has a steady rise in his PSA. In September 2016 he had a DVT/PE and this may have been secondary to his prostate cancer. At that time anti-platelet agents were stopped and NOACs started (rivaroxaban). Workup showed no definitive bone lesions, but increased pelvic and retroperitoneal adenopathy.He was started on abiraterone and prednisone, with a significant PSA response.  Recently his prednisone was decreased to 5 mg in AM. The patient has been feeling weaker partly as a result. However on 08/23/2015 he felt severely short of breath just walking across the room and developed anginal chest pain (unusual for him). 911 was called and he received NTG and ASA prior to transport. In the ED he had recurrence of the anginal symptoms. His troponin did rise and is dropping but not entirely normalizing and his Hb which normally hovers around 11 has dropped to <8.   He has had some BRBPR. Iron studies show a ferritin >100 and high transferrin saturation. GI workup is in process and cardiac catheterization. Edward Woodward  multiple other medical problems are listed below and well summarized in our most recent office note from 08/09/2015  INTERVAL HISTORY: I evaluated the patient in his hospital room, with his wife present.  REVIEW OF SYSTEMS: Currently the patient denies CP or SOB at rest. He is not fully ambulatory because of neuropathy. Denies unusual headaches, N/V, or sudden visual changes. Denies cough, catch in the chest, or pleurisy. BRBPR was minimal, essentially on tissue, no melena. Denies fever, rash, other source of bleeding, or adenopathy. Has been gaining weight. A detailed ROS today was otherwise noncontributory.  PAST MEDICAL HISTORY: Past Medical History  Diagnosis Date  . Cancer ([place])   . Prostate cancer ([place])   . Neuropathy ([place])   . Hypercholesteremia   . Depressive disorder, not elsewhere classified   . Angina pectoris ([place])   . Sinoatrial node dysfunction ([facility])   . Degeneration of lumbar or lumbosacral intervertebral disc   . Dyspnea   . CAD (coronary artery disease)      NYHA Class 1B, CABG 1985  . HTN (hypertension)   . OSA on CPAP   . Collagen vascular disease ([place])   . Complication of anesthesia     " I get confused afterwords  "  . PE (pulmonary embolism) 03/2015  . Neuromuscular disorder ([facility])     neuropathy  . GERD (gastroesophageal reflux disease)   . Presence of permanent cardiac pacemaker     PAST SURGICAL HISTORY: Past Surgical History  Procedure Laterality Date  . Coronary artery bypass graft    . Exploratory laparotomy    . Endarterectomy    . Rotator cuff repair    .  Lumbar fusion    . Coronary angioplasty with stent placement      s/p bare metal stent implant SVG-diagonal 11/23/05, s/p BM stent implantation SVG diagonal 10/22/06 (feeds LAD), and 05/2010 with DES to left circumflex  . Pacemaker insertion      FAMILY HISTORY Family History  Problem Relation Age of Onset  . Heart disease Father 74    HEALTH MAINTENANCE: Social History  Substance Use  Topics  . Smoking status: Never Smoker   . Smokeless tobacco: Never Used  . Alcohol Use: No      Allergies  Allergen Reactions  . Penicillins Swelling  . Simvastatin Other (See Comments)    Muscle weakness in legs ?    Current Facility-Administered Medications  Medication Dose Route Frequency Provider Last Rate Last Dose  . 0.9 %  sodium chloride infusion   Intravenous Once Brett Canales, PA-C      . abiraterone Acetate (ZYTIGA) tablet 1,000 mg  1,000 mg Oral Daily Alfonso Ramus, Edward   1,000 mg at 08/24/15 1421  . acetaminophen (TYLENOL) tablet 500 mg  500 mg Oral Q6H PRN Rafael Bihari, Edward   500 mg at 08/25/15 [id number]  . aspirin chewable tablet 324 mg  324 mg Oral Once Jola Schmidt, Edward   Stopped at 08/23/15 1916  . aspirin chewable tablet 81 mg  81 mg Oral Daily Alfonso Ramus, Edward   81 mg at 08/25/15 1025  . atorvastatin (LIPITOR) tablet 20 mg  20 mg Oral QODAY Alfonso Ramus, Edward   20 mg at 08/24/15 1047  . citalopram (CELEXA) tablet 20 mg  20 mg Oral Daily Alfonso Ramus, Edward   20 mg at 08/25/15 1024  . furosemide (LASIX) injection 20 mg  20 mg Intravenous Once Bryan W Hager, PA-C      . gabapentin (NEURONTIN) capsule 300 mg  300 mg Oral BID Alfonso Ramus, Edward   300 mg at 08/25/15 1025  . heparin ADULT infusion 100 units/mL (25000 units/250 mL)  1,300 Units/hr Intravenous Continuous Ronald Lobo, Edward 13 mL/hr at 08/25/15 1139 1,300 Units/hr at 08/25/15 1139  . metoprolol succinate (TOPROL-XL) 24 hr tablet 25 mg  25 mg Oral Daily Alfonso Ramus, Edward   25 mg at 08/25/15 1024  . nitroGLYCERIN (NITROGLYN) 2 % ointment 0.5 inch  0.5 inch Topical 4 times per day Alfonso Ramus, Edward   0.5 inch at 08/25/15 1308  . nitroGLYCERIN (NITROSTAT) SL tablet 0.4 mg  0.4 mg Sublingual Q5 min PRN Jola Schmidt, Edward   0.4 mg at 08/23/15 1852  . ondansetron (ZOFRAN) tablet 4 mg  4 mg Oral Q6H PRN Alfonso Ramus, Edward       Or  . ondansetron [facility]) injection 4 mg  4 mg Intravenous Q6H PRN  Alfonso Ramus, Edward      . pantoprazole (PROTONIX) EC tablet 40 mg  40 mg Oral BID AC Robert Buccini, Edward      . polyethylene glycol-electrolytes (NuLYTELY/GoLYTELY) solution 4,000 mL  4,000 mL Oral Once Ronald Lobo, Edward      . predniSONE (DELTASONE) tablet 5 mg  5 mg Oral Q breakfast Alfonso Ramus, Edward   5 mg at 08/25/15 0754  . sodium chloride flush (NS) 0.9 % injection 3 mL  3 mL Intravenous Q12H Alfonso Ramus, Edward   3 mL at 08/24/15 1047    OBJECTIVE: elderly White male examined in a chair Filed Vitals:   08/24/15 2157  08/25/15 0438  BP:  123/56  Pulse: 68 65  Temp:  98.9 F (37.2 C)  Resp: 18 16     Body mass index is 32.33 kg/(m^2).    ECOG FS:2 - Symptomatic, <50% confined to bed  Ocular: Sclerae unicteric, EOMs intact Ear-nose-throat: Oropharynx clear, slightly dry Lymphatic: No cervical or supraclavicular adenopathy Lungs no rales or rhonchi, fair excursion bilaterally Heart regular rate and rhythm, no murmur appreciated Abd soft, nontender, positive bowel sounds Neuro: non-focal, well-oriented, appropriate affect   LAB RESULTS:  CMP     Component Value Date/Time   NA 143 08/24/2015 1200   NA 137 08/08/2015 1211   K 3.7 08/24/2015 1200   K 4.7 08/08/2015 1211   CL 103 08/24/2015 1200   CL 105 05/31/2012 1058   CO2 29 08/24/2015 1200   CO2 25 08/08/2015 1211   GLUCOSE 132* 08/24/2015 1200   GLUCOSE 85 08/08/2015 1211   GLUCOSE 85 05/31/2012 1058   BUN 24* 08/24/2015 1200   BUN 23.2 08/08/2015 1211   CREATININE 1.41* 08/24/2015 1200   CREATININE 1.2 08/08/2015 1211   CALCIUM 9.3 08/24/2015 1200   CALCIUM 9.6 08/08/2015 1211   CALCIUM 10.4 06/24/2007 0855   PROT 6.1* 08/23/2015 1810   PROT 6.4 08/08/2015 1211   ALBUMIN 3.2* 08/23/2015 1810   ALBUMIN 3.4* 08/08/2015 1211   AST 20 08/23/2015 1810   AST 15 08/08/2015 1211   ALT 14* 08/23/2015 1810   ALT 12 08/08/2015 1211   ALKPHOS 48 08/23/2015 1810   ALKPHOS 52 08/08/2015 1211   BILITOT 1.3*  08/23/2015 1810   BILITOT 0.50 08/08/2015 1211   GFRNONAA 44* 08/24/2015 1200   GFRAA 50* 08/24/2015 1200    INo results found for: SPEP, UPEP  Lab Results  Component Value Date   WBC 6.9 08/25/2015   NEUTROABS 3.6 08/23/2015   HGB 7.9* 08/25/2015   HCT 25.2* 08/25/2015   MCV 98.1 08/25/2015   PLT 161 08/25/2015    @LASTCHEMISTRY @  No results found for: LABCA2  No components found for: LABCA125   Recent Labs Lab 08/24/15 0343  INR 1.34    Urinalysis No results found for: COLORURINE, APPEARANCEUR, LABSPEC, PHURINE, GLUCOSEU, HGBUR, BILIRUBINUR, KETONESUR, PROTEINUR, UROBILINOGEN, NITRITE, LEUKOCYTESUR  STUDIES: Dg Chest Portable 1 View  08/23/2015  CLINICAL DATA:  Chest tightness, shortness of breath for 1 day EXAM: PORTABLE CHEST 1 VIEW COMPARISON:  03/31/2015 FINDINGS: Prior CABG. Left pacer in stable position. Heart and mediastinal contours are within normal limits. No focal opacities or effusions. No acute bony abnormality. IMPRESSION: No active cardiopulmonary disease. Electronically Signed   By: Rolm Baptise M.D.   On: 08/23/2015 17:57    ASSESSMENT: 82 y.o. [place] man admitted with worsening SOB and angina in the setting of CAD and anemia. Patient was on rivaroxaban started September 2016 for DVT/PE.  (1) history of follicular NHL, high grade, treated with cyclophosphamide, rituximab and prednisone (no anthracyclines) x 6, completed March 2009, w no evidence of recurrence  (2) prostate cancer, with PSA rising to 13.94 June 2016 and 19.55 September 2016; bone scan 05/09/2015 read as no definitive metastases but CT abd/pelvis 10/05 2016 shwing progressive pelvic and retroperitoneal adenopathy; on abiraterone/ prednisone started October 2016 with repeat PSA January 2017 down to 3.4  (3) left common femoral v DVT and large Right central PE September 2016, predisposing factors being prostate cancer and decreased mobility; started on rivaroxaban, stopped this  admission w concern re GIB  (4) normocytic anemia with  baseline 10.9 - 11.5, dropping to 9.1 two days ago on admission down to 7.9 today; iron studies show no deficiency; etiology is multifactorial with anemia of renal failure, history of chemotherapy, drugs, anemia of chronic illness and GIB all likely contributing  (5) CAD, s/p CABG 1995, s/p stenting, now with unstable angina symptoms and a rise in troponin, felt possibly due to demand ischemia from anemia; echo results pending; cath under consideration  (6) possible GIB: workup in progress with scoping scheduled for 08/26/2015  PLAN: Agree with plan for transfusing with goal of maintaining Hb >9. Given association with cardiovascular events and increased mortality in this population would be concerned starting EPO. Given unremarkable iron studies doubt a significant GI bleeding source will be uncovered but agree scoping is step one. Will start folate and add reticulocyte studies.  The patient has been on rivaroxaban >3 months. There was no precipitating factor that I can ascertain from the pt or records other than worsening prostate cancer. That is now better controlled. The question is whether the patient can tolerate double anticoagulation (rivaroxaban for PE and antiplatelets for his stents) and maintain an adequate hemoglobin.  He is now off NOAC and on IV heparin + ASA. Options for discharge are transitioning to rivaroxaban plus ASA, leaving off NOACs and treateting with ASA +/- plavix, and if NOACs stopped whether IVC filter should be placed. I favor the rivaroxaban/ ASA option but it will be helpful to know cath and echo results. Dr Benay Spice will be back on the case as of tomorrow and will participate in that decision.  Appreciate your letting us know of Edward Woodward's admission. Will follow with you.  Chauncey Cruel, Edward   08/25/2015 1:34 PM Medical Oncology and Hematology [facility] [address] [place], [place]  [address] Tel. [phone]    Fax. [phone]

## 2015-08-25 NOTE — Progress Notes (Signed)
Echocardiogram 2D Echocardiogram with Definity has been performed.  Tresa Res 08/25/2015, 10:08 AM

## 2015-08-25 NOTE — Progress Notes (Signed)
Multiple developments since yesterday:  1.  Pt had grossly non-bloody BM that tested positive for occult blood, approximately a week since his observed visible bleeding episode, making contamination from that episode unlikely. 2.  Pt's hgb has fallen further, now 7.9 3.  Iron studies have come back in nl range, going against anemia being the result of chronic long-term GI bld loss, but rasing the question of (?unrecognized) subacute bleeding since being started on Xarelto.  Patient remains on heparin infusion, but aspirin has also been started. In view of this, I have started the patient on PPI prophylaxis.  Patient remains free of shortness of breath or further chest pain.  Lengthy discussion with patient, wife, and Dr. Meda Coffee. Based on that conversation, we have drawn the following conclusions:  1. The patient's mild elevation of troponins is probably reflective of demand ischemia rather than an acute MI (repeat echo reading is pending). 2. The patient's progressive drop in hemoglobin is the probable cause for his chest pain 3. It is not clear that the patient will be taken to cardiac cath, taking into consideration of his coexisting medical problems including PE with possible evolution into triple anticoagulant therapy depending on what was done during the catheterization, GI bleeding risk, and underlying lymphoma (note that CT 4 months ago did not show any overt GI tract involvement). 4. Dr. Meda Coffee feels that endoscopy and colonoscopy are needed, and can be fairly safely tolerated if the patient is transfused before they are performed.  Our plan is as follows:  1. Dr. Meda Coffee will read today's echo and notify me if there has been a significant decrement in pt's left ventricular function compared to earlier. 2. Otherwise, we will plan to proceed to endoscopy and colonoscopy tomorrow, under monitored anesthesia care to optimize safety of sedation. 3. In the meantime, Dr. Meda Coffee  plans to  transfuse the patient 2 units of packed cells, to get his hemoglobin well into an acceptable range (note that, even at baseline, the patient apparently runs a hemoglobin in the 11 range). 4. Dr. Meda Coffee has okayed for me to stop the patient's heparin 6 hours prior to the procedure, which I have ordered. I have also ordered for it to be restarted by pharmacy following completion of the procedures once the patient is back on the floor, unless Dr. Watt Climes indicates otherwise based on the endoscopic/colonoscopic findings. 5. The purpose and risks (anesthesia risk, bleeding, perforation) of endoscopy and colonoscopy were reviewed with the patient and his wife in detail. They understand that he is at somewhat higher risk because of his cardiac status, yet we feel that there is also risk to having unexplained blood loss, which puts him at risk for recurring anemia and hence recurring coronary ischemia. Therefore, Dr. Meda Coffee, the patient, his wife, and I are all in favor of proceeding as planned. It is noted that, on his exams about 13 years ago, the colonoscopy was not particularly technically difficult.  Time at bedside in for care coordination today was 50 minutes.  Cleotis Nipper, M.D. Pager [phone] If no answer or after 5 PM call [phone]

## 2015-08-25 NOTE — Progress Notes (Addendum)
Patient Name: Edward Woodward Date of Encounter: 08/25/2015  Active Problems:   Coronary atherosclerosis of native coronary artery   Essential hypertension, benign   OSA on CPAP   Cardiac pacemaker -[employer]   Pulmonary embolus [facility])   Chest pain   Chronic systolic heart failure (HCC)   Pain in the chest   NSTEMI (non-ST elevated myocardial infarction) (HCC)   Iron deficiency anemia due to chronic blood loss   Length of Stay: 1  SUBJECTIVE  The patient continues to have intermittent shoulder pain. He had an episode of dizziness and felt clammy when he tried to sit up in order to eat breakfast. He had a bright red blood in his stool again.   CURRENT MEDS . abiraterone Acetate  1,000 mg Oral Daily  . aspirin  324 mg Oral Once  . aspirin  81 mg Oral Daily  . atorvastatin  20 mg Oral QODAY  . citalopram  20 mg Oral Daily  . gabapentin  300 mg Oral BID  . metoprolol succinate  25 mg Oral Daily  . nitroGLYCERIN  0.5 inch Topical 4 times per day  . predniSONE  5 mg Oral Q breakfast  . sodium chloride flush  3 mL Intravenous Q12H   . heparin 1,500 Units/hr (08/25/15 0431)   OBJECTIVE  Filed Vitals:   08/24/15 1353 08/24/15 2002 08/24/15 2157 08/25/15 0438  BP: 118/48 115/44  123/56  Pulse: 70 65 68 65  Temp: 98.1 F (36.7 C) 98.1 F (36.7 C)  98.9 F (37.2 C)  TempSrc: Oral Oral  Axillary  Resp: 18 18 18 16   Height:      Weight:      SpO2: 98% 95%  98%    Intake/Output Summary (Last 24 hours) at 08/25/15 1020 Last data filed at 08/25/15 0959  Gross per 24 hour  Intake    555 ml  Output   1150 ml  Net   -595 ml   Filed Weights   08/24/15 0015  Weight: 225 lb 5 oz (102.2 kg)    PHYSICAL EXAM  General: Pleasant, NAD. Neuro: Alert and oriented X 3. Moves all extremities spontaneously. Psych: Normal affect. HEENT:  Normal  Neck: Supple without bruits or JVD. Lungs:  Resp regular and unlabored, CTA. Heart: RRR no s3, s4, or murmurs. Abdomen:  Soft, non-tender, non-distended, BS + x 4.  Extremities: No clubbing, cyanosis or edema. DP/PT/Radials 2+ and equal bilaterally.  Accessory Clinical Findings  CBC  Recent Labs  08/23/15 1810 08/24/15 0343 08/25/15 0034  WBC 6.3 6.1 6.9  NEUTROABS 3.6  --   --   HGB 9.1* 8.7* 7.9*  HCT 29.2* 27.2* 25.2*  MCV 97.3 98.2 98.1  PLT 178 163 [id number]   Basic Metabolic Panel  Recent Labs  08/23/15 1810 08/24/15 1200  NA 141 143  K 4.2 3.7  CL 103 103  CO2 28 29  GLUCOSE 114* 132*  BUN 27* 24*  CREATININE 1.53* 1.41*  CALCIUM 9.6 9.3   Liver Function Tests  Recent Labs  08/23/15 1810  AST 20  ALT 14*  ALKPHOS 48  BILITOT 1.3*  PROT 6.1*  ALBUMIN 3.2*    Recent Labs  08/24/15 0830 08/24/15 1540 08/24/15 2327  TROPONINI 0.27* 0.21* 0.14*    Recent Labs  08/24/15 0343  CHOL 139  HDL 32*  LDLCALC 80  TRIG 134  CHOLHDL 4.3   Radiology/Studies  Dg Chest Portable 1 View  08/23/2015  CLINICAL DATA:  Chest tightness, shortness of breath for 1 day EXAM: PORTABLE CHEST 1 VIEW COMPARISON:  03/31/2015 FINDINGS: Prior CABG. Left pacer in stable position. Heart and mediastinal contours are within normal limits. No focal opacities or effusions. No acute bony abnormality. IMPRESSION: No active cardiopulmonary disease. Electronically Signed   By: Rolm Baptise M.D.   On: 08/23/2015 17:57   TELE: SR    ASSESSMENT AND PLAN  82 yo M pt of Dr. Daneen Schick w a h/o CAD s/p CABG (1995) s/p subsequent PCI's (BMS to SVG-Diag 2007, BMS to SVG-Diag 2008, DES to LCx 2011), SSS s/p PPM, HTN, HLD, CKD (baseline Cr 1.2-1.5), Non-Hodgkin's lymphoma (s/p Chemo 2008-2009), prostate cancer (on Abiraterone & Prednisone since 04/2015), PE (bilatreral diagnosed 03/2015, started Xarelto, stopped Clopidogrel), & OSA on CPAP p/w CP.  # Chest pain - Given his simultaneous discontinuation of ASA & Clopidogrel during the time of Xarelto initiation, his stent patency is concerning. His troponin is now  elevated at 0.03 --> 0.18 --> 0.27--> 0.14, however worsening anemia since started on anticoagulation 11.5 --> 9.1 --> 8.7--> 7.9. Positive FOBT. We haven't transfused yet as we don't want to masquerade bleeding. Ideally Hb > 9 g/L.  This is a very complex situation. We asked GI for help and they concluded that his anemia is not significant, they don't consider further workup and are ok with use of heparin/xarelto and aspirin.  At this point with ongoing bleeding, worsening ischemia, I believe that his troponin elevation is demand ischemia in the settings of known CAD and significant drop in Hg in the last 3 months.  We will try to treat conservatively with improving anemia and antianginal drugs. If he doesn't improve we will consider a cath in the future. However, anemia and bleeding is an ongoing issue and needs to be worked up whether we cath or not.  We will also include patient's oncologist in the management on chronic anticoagulation taht was started in 03/2015 for pulmonary embolism.    Echocardiogram to evaluate possible new wall motion abnormalities is pending.  # ICM (EF 48% per Carlton Adam 01/2014) - He appears mildy volume overloaded. - Will continue his current Metoprolol & consider additional afterload reduction based on the trend of his blood pressure overnight.  - Monitor strict I/O & daily weights. - Follow-up BNP & TTE.  - Will give a single dose of Lasix 20 mg IV x 1 & monitor response. I expect this will help his renal perfusion pressure by decreasing his CVP, & accordingly his AKI will improve.  # SSS s/p PPM - The ED was in the process of having his device interrogated to investigate whether arrhythmia contributed to his presentation. We will be sure to follow-up on this.   # HTN - Normotensive in ED. - We will continue his home Metoprolol.   # PE - As per above, we are currently transitioning from Xarelto to Heparin.  # Prostate Cancer - We will continue his home  Prednisone & Zytiga.   # OSA on CPAP  # PPx - Heparin  # Full code  Signed, Dorothy Spark MD, [facility] 08/25/2015

## 2015-08-26 ENCOUNTER — Inpatient Hospital Stay (HOSPITAL_COMMUNITY): Payer: Medicare Other | Admitting: Anesthesiology

## 2015-08-26 ENCOUNTER — Encounter (HOSPITAL_COMMUNITY): Payer: Self-pay | Admitting: Physician Assistant

## 2015-08-26 ENCOUNTER — Encounter (HOSPITAL_COMMUNITY)
Admission: EM | Disposition: A | Discharge status: Home / Self Care | Payer: Self-pay | Source: Home / Self Care | Attending: Cardiovascular Disease

## 2015-08-26 DIAGNOSIS — I82402 Acute embolism and thrombosis of unspecified deep veins of left lower extremity: Secondary | ICD-10-CM

## 2015-08-26 DIAGNOSIS — K922 Gastrointestinal hemorrhage, unspecified: Secondary | ICD-10-CM

## 2015-08-26 DIAGNOSIS — I2699 Other pulmonary embolism without acute cor pulmonale: Secondary | ICD-10-CM

## 2015-08-26 DIAGNOSIS — Z7901 Long term (current) use of anticoagulants: Secondary | ICD-10-CM

## 2015-08-26 HISTORY — PX: COLONOSCOPY: SHX5424

## 2015-08-26 HISTORY — PX: ESOPHAGOGASTRODUODENOSCOPY (EGD) WITH PROPOFOL: SHX5813

## 2015-08-26 LAB — HEMOGLOBIN A1C
HEMOGLOBIN A1C: 5.2 % (ref 4.8–5.6)
MEAN PLASMA GLUCOSE: 103 mg/dL

## 2015-08-26 LAB — DIFFERENTIAL
Basophils Absolute: 0 10*3/uL (ref 0.0–0.1)
Basophils Relative: 0 %
EOS ABS: 0.2 10*3/uL (ref 0.0–0.7)
EOS PCT: 2 %
LYMPHS ABS: 1.8 10*3/uL (ref 0.7–4.0)
Lymphocytes Relative: 26 %
MONO ABS: 0.6 10*3/uL (ref 0.1–1.0)
Monocytes Relative: 8 %
NEUTROS PCT: 64 %
Neutro Abs: 4.4 10*3/uL (ref 1.7–7.7)

## 2015-08-26 LAB — CBC
HEMATOCRIT: 30.7 % — AB (ref 39.0–52.0)
HEMOGLOBIN: 9.9 g/dL — AB (ref 13.0–17.0)
MCH: 30.7 pg (ref 26.0–34.0)
MCHC: 32.2 g/dL (ref 30.0–36.0)
MCV: 95 fL (ref 78.0–100.0)
Platelets: 165 10*3/uL (ref 150–400)
RBC: 3.23 MIL/uL — ABNORMAL LOW (ref 4.22–5.81)
RDW: 17.1 % — AB (ref 11.5–15.5)
WBC: 7 10*3/uL (ref 4.0–10.5)

## 2015-08-26 LAB — TYPE AND SCREEN
ABO/RH(D): A POS
Antibody Screen: NEGATIVE
UNIT DIVISION: 0
Unit division: 0

## 2015-08-26 LAB — VITAMIN B12: VITAMIN B 12: 3473 pg/mL — AB (ref 180–914)

## 2015-08-26 LAB — BASIC METABOLIC PANEL
Anion gap: 9 (ref 5–15)
BUN: 16 mg/dL (ref 6–20)
CHLORIDE: 106 mmol/L (ref 101–111)
CO2: 28 mmol/L (ref 22–32)
CREATININE: 1.35 mg/dL — AB (ref 0.61–1.24)
Calcium: 8.5 mg/dL — ABNORMAL LOW (ref 8.9–10.3)
GFR calc Af Amer: 53 mL/min — ABNORMAL LOW (ref >60)
GFR calc non Af Amer: 46 mL/min — ABNORMAL LOW (ref >60)
Glucose, Bld: 102 mg/dL — ABNORMAL HIGH (ref 65–99)
Potassium: 3.7 mmol/L (ref 3.5–5.1)
SODIUM: 143 mmol/L (ref 135–145)

## 2015-08-26 LAB — RETICULOCYTES
RBC.: 3.23 MIL/uL — ABNORMAL LOW (ref 4.22–5.81)
RETIC CT PCT: 5.1 % — AB (ref 0.4–3.1)
Retic Count, Absolute: 164.7 10*3/uL (ref 19.0–186.0)

## 2015-08-26 LAB — FOLATE: FOLATE: 30.5 ng/mL (ref >5.9)

## 2015-08-26 LAB — APTT: APTT: 68 s — AB (ref 24–37)

## 2015-08-26 LAB — HEPARIN LEVEL (UNFRACTIONATED): HEPARIN UNFRACTIONATED: 0.36 [IU]/mL (ref 0.30–0.70)

## 2015-08-26 LAB — TROPONIN I: TROPONIN I: 0.07 ng/mL — AB (ref <0.031)

## 2015-08-26 SURGERY — COLONOSCOPY WITH PROPOFOL
Anesthesia: Monitor Anesthesia Care

## 2015-08-26 SURGERY — ESOPHAGOGASTRODUODENOSCOPY (EGD) WITH PROPOFOL
Anesthesia: Monitor Anesthesia Care

## 2015-08-26 MED ORDER — NITROGLYCERIN 0.4 MG SL SUBL
SUBLINGUAL_TABLET | SUBLINGUAL | Status: AC
  Filled 2015-08-26: qty 1

## 2015-08-26 MED ORDER — METOPROLOL SUCCINATE ER 50 MG PO TB24
50.0000 mg | ORAL_TABLET | Freq: Once | ORAL | Status: DC
  Filled 2015-08-26: qty 1

## 2015-08-26 MED ORDER — SODIUM CHLORIDE 0.9 % WEIGHT BASED INFUSION
3.0000 mL/kg/h | INTRAVENOUS | Status: DC
  Administered 2015-08-27: 3 mL/kg/h via INTRAVENOUS

## 2015-08-26 MED ORDER — FENTANYL CITRATE (PF) 100 MCG/2ML IJ SOLN: 25.0000 ug | INTRAMUSCULAR | Status: DC | PRN

## 2015-08-26 MED ORDER — HEPARIN (PORCINE) IN NACL 100-0.45 UNIT/ML-% IJ SOLN
1500.0000 [IU]/h | INTRAMUSCULAR | Status: DC
  Administered 2015-08-26: 1300 [IU]/h via INTRAVENOUS
  Filled 2015-08-26: qty 250

## 2015-08-26 MED ORDER — ONDANSETRON HCL 4 MG/2ML IJ SOLN: 4.0000 mg | Freq: Once | INTRAMUSCULAR | Status: DC | PRN

## 2015-08-26 MED ORDER — SODIUM CHLORIDE 0.9% FLUSH
3.0000 mL | INTRAVENOUS | Status: DC | PRN
Start: 2015-08-26 — End: 2015-08-27

## 2015-08-26 MED ORDER — PROPOFOL 10 MG/ML IV BOLUS
INTRAVENOUS | Status: DC | PRN
  Administered 2015-08-26 (×5): 30 mg via INTRAVENOUS
  Administered 2015-08-26 (×2): 20 mg via INTRAVENOUS
  Administered 2015-08-26: 30 mg via INTRAVENOUS
  Administered 2015-08-26 (×4): 20 mg via INTRAVENOUS
  Administered 2015-08-26: 30 mg via INTRAVENOUS

## 2015-08-26 MED ORDER — SODIUM CHLORIDE 0.9% FLUSH: 3.0000 mL | Freq: Two times a day (BID) | INTRAVENOUS | Status: DC

## 2015-08-26 MED ORDER — METOPROLOL SUCCINATE ER 50 MG PO TB24
50.0000 mg | ORAL_TABLET | Freq: Once | ORAL | Status: AC
  Administered 2015-08-27: 50 mg via ORAL
  Filled 2015-08-26: qty 1

## 2015-08-26 MED ORDER — ASPIRIN 81 MG PO CHEW
81.0000 mg | CHEWABLE_TABLET | ORAL | Status: AC
  Administered 2015-08-27: 81 mg via ORAL
  Filled 2015-08-26: qty 1

## 2015-08-26 MED ORDER — SODIUM CHLORIDE 0.9 % WEIGHT BASED INFUSION: 1.0000 mL/kg/h | INTRAVENOUS | Status: DC

## 2015-08-26 MED ORDER — LACTATED RINGERS IV SOLN
INTRAVENOUS | Status: DC
  Administered 2015-08-26: 12:00:00 via INTRAVENOUS

## 2015-08-26 MED ORDER — MORPHINE SULFATE (PF) 4 MG/ML IV SOLN
1.0000 mg | Freq: Once | INTRAVENOUS | Status: AC
  Administered 2015-08-26: 1 mg via INTRAVENOUS

## 2015-08-26 MED ORDER — SODIUM CHLORIDE 0.9 % IV SOLN: 250.0000 mL | INTRAVENOUS | Status: DC | PRN

## 2015-08-26 MED ORDER — MORPHINE SULFATE (PF) 4 MG/ML IV SOLN
INTRAVENOUS | Status: AC
  Filled 2015-08-26: qty 1

## 2015-08-26 MED ORDER — FUROSEMIDE 20 MG PO TABS
20.0000 mg | ORAL_TABLET | Freq: Every day | ORAL | Status: DC
  Administered 2015-08-28: 20 mg via ORAL
  Filled 2015-08-26: qty 1

## 2015-08-26 MED ORDER — LIDOCAINE HCL (CARDIAC) 20 MG/ML IV SOLN
INTRAVENOUS | Status: DC | PRN
  Administered 2015-08-26: 50 mg via INTRAVENOUS

## 2015-08-26 NOTE — Op Note (Signed)
[facility] [place], [id number]   COLONOSCOPY PROCEDURE REPORT     EXAM DATE: 09-17-15  PATIENT NAME:      Edward Woodward, Edward Woodward           MR #:      [id number]  BIRTHDATE:       10/10/1926      VISIT #:     [phone]  ATTENDING:     Clarene Essex, MD     STATUS:     outpatient ASSISTANT:      William Dalton and Carlyn Reichert  INDICATIONS:  The patient is a 82 yr old male here for a colonoscopy due to PROCEDURE PERFORMED:     Colonoscopy, diagnostic MEDICATIONS:     Propofol 280 mg IV  50 mg IV lidocaine ESTIMATED BLOOD LOSS:     None  CONSENT: The patient understands the risks and benefits of the procedure and understands that these risks include, but are not limited to: sedation, allergic reaction, infection, perforation and/or bleeding. Alternative means of evaluation and treatment include, among others: physical exam, x-rays, and/or surgical intervention. The patient elects to proceed with this endoscopic procedure.  DESCRIPTION OF PROCEDURE: During intra-op preparation period all mechanical & medical equipment was checked for proper function. Hand hygiene and appropriate measures for infection prevention was taken. After the risks, benefits and alternatives of the procedure were thoroughly explained, Informed consent was verified, confirmed and timeout was successfully executed by the treatment team. A digital exam revealed external hemorrhoids. The Pentax Ped Colon [id number] endoscope was introduced through the anus and advanced to the terminal ileum which was intubated for a short distance.the prep was  adequatebut required over a liter of washing and suctioning The instrument was then slowly withdrawn as the colon was fully examined.Estimated blood loss is zero unless otherwise noted in this procedure report. findings are recorded below      Retroflexed views revealed internal hemorrhoids. The scope was then completely  withdrawn from the patient and the procedure terminated. SCOPE WITHDRAWAL TIME: see nurse's note    ADVERSE EVENTS:      There were no immediate complications.  IMPRESSIONS:     1. Internal/external hemorrhoids 2. Few left-sided diverticuli 3. Multiple descending transverse ascending and cecal small polyps not biopsied   4. No blood seen on insertion and exam to the terminal ileum 5. Very small clot in mid descending at 40 cm washed off with minimal oozing which stopped on its own and was reevaluated on re-advancing one more time and appeared to be a nonsteroidal and aspirin-induced erosion and again not bleeding at the end of the procedure  RECOMMENDATIONS:     hold heparin until tomorrow reevaluate blood thinner needs but care with aspirin and nonsteroidals at home if on a  blood thinner and consider further workup like CT or capsule endoscopy if signs of bleeding continue but no blood seen in the terminal ileum okay to hold off for now and continue workup with an endoscopy RECALL:     as needed  _____________________________ Clarene Essex, MD eSigned:  Clarene Essex, MD 2015-09-17 1:31 PM   cc:   CPT CODES: ICD CODES:  The ICD and CPT codes recommended by this software are interpretations from the data that the clinical staff has captured with the software.  The verification of the translation of this report to the ICD and CPT codes and modifiers is the sole responsibility of the health care institution and  practicing physician where this report was generated.  [place]. will not be held responsible for the validity of the ICD and CPT codes included on this report.  AMA assumes no liability for data contained or not contained herein. CPT is a Designer, television/film set of the [employer].   PATIENT NAME:  Edward Woodward, Edward Woodward MR#: [id number]

## 2015-08-26 NOTE — Progress Notes (Signed)
Patient Name: Edward Woodward Date of Encounter: 08/26/2015     Active Problems:   Coronary atherosclerosis of native coronary artery   Essential hypertension, benign   OSA on CPAP   Cardiac pacemaker -[employer]   Pulmonary embolus [facility])   Chest pain   Chronic systolic heart failure (HCC)   Pain in the chest   NSTEMI (non-ST elevated myocardial infarction) (HCC)   Iron deficiency anemia due to chronic blood loss    SUBJECTIVE  No more CP. Awaiting colonoscopy and EGD today.   CURRENT MEDS . abiraterone Acetate  1,000 mg Oral QAC breakfast  . aspirin  81 mg Oral Daily  . atorvastatin  20 mg Oral QODAY  . citalopram  20 mg Oral Daily  . folic acid  1 mg Oral Daily  . gabapentin  300 mg Oral BID  . metoprolol succinate  25 mg Oral Daily  . nitroGLYCERIN  0.5 inch Topical 4 times per day  . pantoprazole  40 mg Oral BID AC  . predniSONE  5 mg Oral Q breakfast  . sodium chloride flush  3 mL Intravenous Q12H    OBJECTIVE  Filed Vitals:   08/25/15 2035 08/25/15 2107 08/25/15 2306 08/26/15 0426  BP: 147/56 136/68 157/65 156/64  Pulse: 65 68 66 65  Temp: 97.7 F (36.5 C) 97.7 F (36.5 C) 97.6 F (36.4 C) 97.8 F (36.6 C)  TempSrc: Oral Oral Oral Oral  Resp: 18 18 16 18   Height:      Weight:      SpO2: 98% 99% 97% 96%    Intake/Output Summary (Last 24 hours) at 08/26/15 0909 Last data filed at 08/25/15 2306  Gross per 24 hour  Intake    480 ml  Output    375 ml  Net    105 ml   Filed Weights   08/24/15 0015  Weight: 225 lb 5 oz (102.2 kg)    PHYSICAL EXAM  General: Pleasant, NAD. Obese, chronically ill appear.  Neuro: Alert and oriented X 3. Moves all extremities spontaneously. Psych: Normal affect. HEENT:  Normal  Neck: Supple without bruits or JVD. Lungs:  Resp regular and unlabored, CTA. Heart: RRR no s3, s4, or murmurs. Abdomen: Soft, non-tender, non-distended, BS + x 4.  Extremities: No clubbing, cyanosis or 1+ bilateral edema.  DP/PT/Radials 2+ and equal bilaterally.  Accessory Clinical Findings  CBC  Recent Labs  08/23/15 1810  08/25/15 0034 08/26/15 0444  WBC 6.3  < > 6.9 7.0  NEUTROABS 3.6  --   --  4.4  HGB 9.1*  < > 7.9* 9.9*  HCT 29.2*  < > 25.2* 30.7*  MCV 97.3  < > 98.1 95.0  PLT 178  < > 161 165  < > = values in this interval not displayed. Basic Metabolic Panel  Recent Labs  08/23/15 1810 08/24/15 1200  NA 141 143  K 4.2 3.7  CL 103 103  CO2 28 29  GLUCOSE 114* 132*  BUN 27* 24*  CREATININE 1.53* 1.41*  CALCIUM 9.6 9.3   Liver Function Tests  Recent Labs  08/23/15 1810  AST 20  ALT 14*  ALKPHOS 48  BILITOT 1.3*  PROT 6.1*  ALBUMIN 3.2*   No results for input(s): LIPASE, AMYLASE in the last 72 hours. Cardiac Enzymes  Recent Labs  08/24/15 0830 08/24/15 1540 08/24/15 2327  TROPONINI 0.27* 0.21* 0.14*   Fasting Lipid Panel  Recent Labs  08/24/15 0343  CHOL 139  HDL  32*  LDLCALC 80  TRIG 134  CHOLHDL 4.3    TELE  A paced with freq PVCS.   Radiology/Studies  Dg Chest Portable 1 View  08/23/2015  CLINICAL DATA:  Chest tightness, shortness of breath for 1 day EXAM: PORTABLE CHEST 1 VIEW COMPARISON:  03/31/2015 FINDINGS: Prior CABG. Left pacer in stable position. Heart and mediastinal contours are within normal limits. No focal opacities or effusions. No acute bony abnormality. IMPRESSION: No active cardiopulmonary disease. Electronically Signed   By: Rolm Baptise M.D.   On: 08/23/2015 17:57   2D ECHO: 08/25/2015 LV EF: 60% -  65% Study Conclusions - Left ventricle: The cavity size was normal. There was moderate concentric hypertrophy. Systolic function was normal. The estimated ejection fraction was in the range of 60% to 65%. Wall motion was normal; there were no regional wall motion abnormalities. Doppler parameters are consistent with abnormal left ventricular relaxation (grade 1 diastolic dysfunction). There was no evidence of elevated  ventricular filling pressure by Doppler parameters. - Aortic valve: There was mild stenosis. There was mild regurgitation. Mean gradient (S): 11 mm Hg. Peak gradient (S): 18 mm Hg. Valve area (VTI): 2.29 cm^2. Valve area (Vmax): 2.09 cm^2. Valve area (Vmean): 1.78 cm^2. - Ascending aorta: The ascending aorta was mildly dilated. - Mitral valve: Structurally normal valve. - Left atrium: The atrium was mildly dilated. - Right ventricle: Systolic function was normal. - Right atrium: The atrium was normal in size. - Tricuspid valve: There was mild regurgitation. - Pulmonary arteries: Systolic pressure was within the normal range. - Inferior vena cava: The vessel was normal in size. - Pericardium, extracardiac: There was no pericardial effusion. Impressions: - Normal LVEF, no new regional wall motion abnormalities are seen.   ASSESSMENT AND PLAN Edward Woodward is a 82 y.o. male with a history of CAD s/p CABG (1995) s/p subsequent PCI's (BMS to SVG-Diag 2007, BMS to SVG-Diag 2008, DES to LCx 2011), SSS s/p PPM, HTN, HLD, CKD (baseline Cr 1.2-1.5), Non-Hodgkin's lymphoma (s/p Chemo 2008-2009), prostate cancer (on Abiraterone & Prednisone since 04/2015), PE (bilateral diagnosed 03/2015, started Xarelto, stopped Clopidogrel), & OSA on CPAP who presented 08/23/15 with dizziness and weakness and later chest pain.   Chest pain/NSTEMI - Given his simultaneous discontinuation of ASA & Clopidogrel during the time of Xarelto initiation, his stent patency is concerning - His troponin was elevated 0.03 --> 0.18 --> 0.27--> 0.21--> 0.14; however worsening anemia since started on anticoagulation 11.5 --> 9.1 --> 8.7. --> 7.9. Troponin elevation is low and flat. This could be c/w demand ischemia in the setting of anemia - GI has seen and he is undergoing EGD and colonoscopy today. The last dose of xarelto was 08/23/15 in the am and he was started on a heparin drip. ASA also restarted ( with PPI).  - TTE  08/25/15 with normal EF and no WMA, which is reassuring  Acute on chronic systolic CHF/ICM: (EF [id number] per Carlton Adam 01/2014) --> now EF completely normalized - Felt to be mildy volume overloaded on admission and given Lasix 20 mg IV x 2. Net neg 474mL. No daily weights done. Will add this.  He is feeling much better after diuresis. His lungs sound clear. I will add back his home lasix ( wife says taking 20mg  daily at home).  Acute anemia: H/H improved after transfusion. FOBT + . Iron studies have come back in normal range, going against anemia being the result of chronic long-term GI bld loss, but rasing the question  of (?unrecognized) subacute bleeding since being started on Xarelto. GI planning EGD/colonoscopy as above.  CKD: creat had improved with diuresis 1.53--> 1.41. No new labs. Will add daily BMETs.   SSS s/p PPM ([employer]) - The ED was in the process of having his device interrogated to investigate whether arrhythmia contributed to his presentation.I have contacted the [facility] rep to get a copy of the interrogation. He is 97% A paced. No Ventricular arrhythmias.   HTN - BP mildly elevated today. Continue home Toprol XL 25.   PE - As per above, transitioned from Xarelto to Heparin.  Prostate Cancer - We will continue his home Prednisone & Zytiga.   OSA on CPAP  Signed, Eileen Stanford PA-C  Pager [phone]

## 2015-08-26 NOTE — Transfer of Care (Signed)
Immediate Anesthesia Transfer of Care Note  Patient: Edward Woodward  Procedure(s) Performed: Procedure(s): ESOPHAGOGASTRODUODENOSCOPY (EGD) WITH PROPOFOL (N/A) COLONOSCOPY (N/A)  Patient Location: Endoscopy Unit  Anesthesia Type:MAC  Level of Consciousness: awake, alert  and oriented  Airway & Oxygen Therapy: Patient Spontanous Breathing and Patient connected to nasal cannula oxygen  Post-op Assessment: Report given to RN and Post -op Vital signs reviewed and stable  Post vital signs: Reviewed and stable  Last Vitals:  Filed Vitals:   08/26/15 1158 08/26/15 1201  BP: 119/91 138/75  Pulse:    Temp:    Resp:      Complications: No apparent anesthesia complications

## 2015-08-26 NOTE — Anesthesia Procedure Notes (Signed)
Procedure Name: MAC Date/Time: 08/26/2015 12:15 PM Performed by: Mariea Clonts Pre-anesthesia Checklist: Patient identified, Timeout performed, Emergency Drugs available, Suction available and Patient being monitored Patient Re-evaluated:Patient Re-evaluated prior to inductionOxygen Delivery Method: Nasal cannula Intubation Type: IV induction

## 2015-08-26 NOTE — Progress Notes (Signed)
Pt received Toprol-XL 25 mg at 1439; Toprol-XL 50 mg was ordered about 5 minutes after the 25 mg dose was administered. Pharmacy was notified and the 50 mg dose was rescheduled.   Grant Fontana RN, BSN

## 2015-08-26 NOTE — Op Note (Signed)
[facility] [place], [id number]   ENDOSCOPY PROCEDURE REPORT  PATIENT: Edward, Woodward  MR#: [id number] BIRTHDATE: [birth year] , 3  yrs. old GENDER: male ENDOSCOPIST: Clarene Essex, MD REFERRED BY: PROCEDURE DATE:  September 21, 2015 PROCEDURE:  EGD, diagnostic ASA CLASS:     Class III INDICATIONS:  hemocult positive stool and acute post hemorrhagic anemia. MEDICATIONS: Propofol 50 mg IV TOPICAL ANESTHETIC: none  DESCRIPTION OF PROCEDURE: After the risks benefits and alternatives of the procedure were thoroughly explained, informed consent was obtained.  The Pentax Gastroscope [id number] endoscope was introduced through the mouth and advanced to the third portion of the duodenum , Without limitations.  The instrument was slowly withdrawn as the mucosa was fully examined. Estimated blood loss is zero unless otherwise noted in this procedure report.    Findings are recorded below       Retroflexed views revealed no abnormalities.     The scope was then withdrawn from the patient and the procedure completed.  COMPLICATIONS: There were no immediate complications.  ENDOSCOPIC IMPRESSION: 1. Small hiatal hernia 2. Otherwise within normal limits to the third portion of the duodenum without signs of GI bleeding  RECOMMENDATIONS: once a day pump inhibitors slowly advance diet see colonoscopy dictation for other recommendations       but okay to hold further GI workup at this time  REPEAT EXAM: as needed  eSigned:  Clarene Essex, MD September 21, 2015 1:35 PM    CC:  CPT CODES: ICD CODES:  The ICD and CPT codes recommended by this software are interpretations from the data that the clinical staff has captured with the software.  The verification of the translation of this report to the ICD and CPT codes and modifiers is the sole responsibility of the health care institution and practicing physician where this report was generated.  [place]. will not be held responsible for the validity of the ICD and CPT codes included on this report.  AMA assumes no liability for data contained or not contained herein. CPT is a Designer, television/film set of the [employer].  PATIENT NAME:  Edward, Woodward MR#: [id number]

## 2015-08-26 NOTE — Anesthesia Preprocedure Evaluation (Addendum)
Anesthesia Evaluation  Patient identified by MRN, date of birth, ID band Patient awake  General Assessment Comment:82 yo M pt of Dr. Daneen Schick w a h/o CAD s/p CABG (1995) s/p subsequent PCI's (BMS to SVG-Diag 2007, BMS to SVG-Diag 2008, DES to LCx 2011), SSS s/p PPM, HTN, HLD, CKD (baseline Cr 1.2-1.5), Non-Hodgkin's lymphoma (s/p Chemo 2008-2009), prostate cancer (on Abiraterone & Prednisone since 04/2015), PE (bilatreral diagnosed 03/2015, started Xarelto, stopped Clopidogrel), & OSA on CPAP p/w CP.  Reviewed: Allergy & Precautions, NPO status , Patient's Chart, lab work & pertinent test results, reviewed documented beta blocker date and time   Airway Mallampati: II  TM Distance: >3 FB Neck ROM: Limited    Dental  (+) Dental Advisory Given, Edentulous Upper, Edentulous Lower, Lower Dentures, Upper Dentures   Pulmonary shortness of breath, sleep apnea and Continuous Positive Airway Pressure Ventilation , PE   Pulmonary exam normal breath sounds clear to auscultation       Cardiovascular hypertension, Pt. on medications and Pt. on home beta blockers + angina + CAD, + Past MI, + Cardiac Stents, + CABG, + Peripheral Vascular Disease, +CHF and + DVT  Normal cardiovascular exam+ pacemaker  Rhythm:Regular Rate:Normal  Echo 08/25/15: Study Conclusions  - Left ventricle: The cavity size was normal. There was moderateconcentric hypertrophy. Systolic function was normal. Theestimated ejection fraction was in the range of 60% to 65%. Wallmotion was normal; there were no regional wall motionabnormalities. Doppler parameters are consistent with abnormalleft ventricular relaxation (grade 1 diastolic dysfunction).There was no evidence of elevated ventricular filling pressure byDoppler parameters. - Aortic valve: There was mild stenosis. There was mild regurgitation. Mean gradient (S): 11 mm Hg. Peak gradient (S): 26mm Hg. Valve area (VTI): 2.29  cm^2. Valve area (Vmax): 2.09 cm^2.Valve area (Vmean): 1.78 cm^2. - Ascending aorta: The ascending aorta was mildly dilated. - Mitral valve: Structurally normal valve. - Left atrium: The atrium was mildly dilated. - Right ventricle: Systolic function was normal. - Right atrium: The atrium was normal in size. - Tricuspid valve: There was mild regurgitation. - Pulmonary arteries: Systolic pressure was within the normalrange. - Inferior vena cava: The vessel was normal in size. - Pericardium, extracardiac: There was no pericardial effusion.  Impressions:  - Normal LVEF, no new regional wall motion abnormalities are seen.   Neuro/Psych PSYCHIATRIC DISORDERS Depression  Neuromuscular disease    GI/Hepatic Neg liver ROS, GERD  Medicated,  Endo/Other  Obesity   Renal/GU Renal InsufficiencyRenal disease     Musculoskeletal  (+) Arthritis , Osteoarthritis,    Abdominal   Peds  Hematology  (+) Blood dyscrasia, anemia ,   Anesthesia Other Findings Day of surgery medications reviewed with the patient.  Reproductive/Obstetrics                          Anesthesia Physical Anesthesia Plan  ASA: IV  Anesthesia Plan: MAC   Post-op Pain Management:    Induction: Intravenous  Airway Management Planned: Nasal Cannula  Additional Equipment:   Intra-op Plan:   Post-operative Plan:   Informed Consent: I have reviewed the patients History and Physical, chart, labs and discussed the procedure including the risks, benefits and alternatives for the proposed anesthesia with the patient or authorized representative who has indicated his/her understanding and acceptance.   Dental advisory given  Plan Discussed with: CRNA  Anesthesia Plan Comments: (Discussed risks/benefits/alternatives to MAC sedation including need for ventilatory support, hypotension, need for conversion to general anesthesia.  All patient questions answered.  Patient wished to proceed.)        Anesthesia Quick Evaluation

## 2015-08-26 NOTE — Progress Notes (Signed)
IP PROGRESS NOTE  Subjective:   Mr. Gilpin was omitted with chest pain and malaise on 08/23/2015. He had noted episodes of rectal bleeding over the preceding 1-2 weeks. He was noted to have a severe anemia on hospital admission. He was transfused packed red blood cells just today. He last took Xarelto on the day of admission.  Objective: Vital signs in last 24 hours: Blood pressure 148/56, pulse 65, temperature 97.6 F (36.4 C), temperature source Axillary, resp. rate 20, height 5\' 10"  (1.778 m), weight 225 lb 5 oz (102.2 kg), SpO2 100 %.  Intake/Output from previous day: 01/29 0701 - 01/30 0700 In: 480 [P.O.:480] Out: 375 [Urine:375]  Physical Exam:  HEENT: Neck without mass Lungs: Clear anteriorly Cardiac: Regular rate and rhythm Abdomen: Soft and nontender, no hepatosplenomegaly Extremities: Trace edema at the left greater than right lower leg Lymph nodes: No cervical, supraclavicular, axillary, or inguinal nodes  Portacath/PICC-without erythema  Lab Results:  Recent Labs  08/25/15 0034 08/26/15 0444  WBC 6.9 7.0  HGB 7.9* 9.9*  HCT 25.2* 30.7*  PLT 161 165    BMET  Recent Labs  08/24/15 1200 08/26/15 0444  NA 143 143  K 3.7 3.7  CL 103 106  CO2 29 28  GLUCOSE 132* 102*  BUN 24* 16  CREATININE 1.41* 1.35*  CALCIUM 9.3 8.5*    Studies/Results: No results found.  Medications: I have reviewed the patient's current medications.  Assessment/Plan:  1. Non-Hodgkin's lymphoma (follicular grade 3 lymphoma) - status post 6 cycles of Cytoxan/prednisone/rituximab 07/01/2007 through 10/21/2007. He remains in clinical remission. 2. CT chest 04/01/2015 with no lymphadenopathy  CT abdomen/pelvis 05/01/2015 with mild progression of abdomen/pelvic lymphadenopathy 3. Prostate cancer - he has been treated with hormonal therapy, the PSA was stable on 10/15/2014  PSA increased 04/26/2015   Bone scan 05/09/2015 with unchanged increased activity in the thoracic and  lumbar spine felt to most likely be degenerative.   Initiation of Abiraterone and prednisone 05/11/2015. Normalization of the PSA on Abiraterone. 4. History of a normocytic anemia secondary to non-Hodgkin's lymphoma, chemotherapy, and renal insufficiency - progressive secondary to GI bleeding 5. History of Mild thrombocytopenia  6. History of bilateral hydronephrosis. Renal ultrasound 09/26/2014 consistent with medical renal disease. No hydronephrosis. 7. Coronary artery disease - status post coronary artery stent placement. 8. History of noncalcified lung nodules. 9. History of severe neutropenia July 2009 - likely related to delayed toxicity from rituximab. 10. Macular degeneration. 11. Right middle lobe density on a CT of the chest 06/18/2010 measuring 2.5 x 1.6 cm with mildly increased metabolic activity (SUV max 2.5) on a PET scan 06/27/2010. The area of increased density was less confluent and appeared smaller on a restaging CT 09/08/2010. Right middle lobe "scarring" with no new or suspicious pulmonary nodule on a CT 08/04/2011. 12. Mild increased metabolic activity associated with several lymph nodes on the PET scan 06/27/2010 - likely related to lymphoma. No lymphadenopathy in the mediastinum or axillary areas on the CT 08/04/2011. 13. Right hip discomfort-he received a steroid injection by his primary physician without improvement. He saw Dr. Collier Salina and there was a question of a lytic process in the right pelvis. A bone scan on 09/21/2012 revealed no evidence of metastatic disease. 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 Renal insufficiency 15. Left leg DVT and pulmonary embolism 04/01/2015-maintained on xarelto 16. Hypercalcemia-status post Zometa 04/26/2015, improved 17. Admission 08/23/2015 with severe anemia and GI bleeding  Upper and lower endoscopy 08/26/2015  without a clear source for bleeding identified  Mr. Ostaszewski remains in clinical  remission from non-Hodgkin's lymphoma. The hormone refractory prostate cancer has improved with Abiraterone/prednisone.  He is admitted with severe anemia secondary to GI bleeding. This occurred while on Xarelto and low-dose prednisone. The hemoglobin is up today after a red cell transfusion.  The GI evaluation does not reveal a clear source for blood loss.  He was maintained on Xarelto after being diagnosed with a pulmonary embolism in September 2016. He remains at significant risk of developing recurrent thromboembolic disease.  Recommendations: 1. Discontinue Xarelto 2. Consider Coumadin anticoagulation versus no further anticoagulation after further discussion with Mr. Donze and his family 3. Continue Abiraterone/prednisone     LOS: 2 days   [place], Nimrat Woolworth  08/26/2015, 5:11 PM

## 2015-08-26 NOTE — Progress Notes (Signed)
Pt cardiac monitoring order was d/c earlier and pt is going for a cardiac cath tomorrow AM. Paged MD Candyce Churn to verify the order. Orders given to continue the cardiac monitoring. Will continue to monitor pt.   Arnika Larzelere, RN

## 2015-08-26 NOTE — Progress Notes (Signed)
[name] 12:02 PM  Subjective: Patient seen and examined and discussed with his family and hospital computer chart reviewed and case discussed with my partner Dr. B unfortunately he just began having chest pain and we are awaiting repeat cardiology consult as well as EKG to proceed and supposedly he did well with his prep without signs of obvious bleeding  Objective: Vital signs stable afebrile no acute distress exam please see preassessment evaluation hemoglobin increased nicely  Assessment: Guaiac positive anemia  Plan: I've discussed his case with cardiology and anesthesia and his chest pain has resolved with one nitroglycerin and they are happy with his EKG and okay to proceed  [facility] E  Pager [phone] After 5PM or if no answer call [phone]

## 2015-08-26 NOTE — Progress Notes (Signed)
ANTICOAGULATION CONSULT NOTE - Follow Up Consult  Pharmacy Consult for Heparin Indication: chest pain/ACS  Allergies  Allergen Reactions  . Penicillins Swelling  . Simvastatin Other (See Comments)    Muscle weakness in legs ?    Patient Measurements: Height: 5\' 10"  (177.8 cm) Weight: 225 lb 5 oz (102.2 kg) IBW/kg (Calculated) : 73 Heparin Dosing Weight: 95 kg  Vital Signs: Temp: 97.6 F (36.4 C) (01/30 1318) Temp Source: Oral (01/30 1318) BP: 167/78 mmHg (01/30 1410) Pulse Rate: 110 (01/30 1410)  Labs:  Recent Labs  08/23/15 1810  08/24/15 0343 08/24/15 0830 08/24/15 1200 08/24/15 1540 08/24/15 1833 08/24/15 2327 08/25/15 0034 08/25/15 0334 08/26/15 0444  HGB 9.1*  --  8.7*  --   --   --   --   --  7.9*  --  9.9*  HCT 29.2*  --  27.2*  --   --   --   --   --  25.2*  --  30.7*  PLT 178  --  163  --   --   --   --   --  161  --  165  APTT  --   < > 34  --   --   --  71*  --   --  88* 68*  LABPROT  --   --  16.7*  --   --   --   --   --   --   --   --   INR  --   --  1.34  --   --   --   --   --   --   --   --   HEPARINUNFRC  --   < > 1.00*  --   --   --  0.88*  --   --  0.94* 0.36  CREATININE 1.53*  --   --   --  1.41*  --   --   --   --   --  1.35*  TROPONINI <0.03  < > 0.18* 0.27*  --  0.21*  --  0.14*  --   --   --   < > = values in this interval not displayed.  Estimated Creatinine Clearance: 47.1 mL/min (by C-G formula based on Cr of 1.35).    Assessment: CC: chest pain  PMH: h/o CAD s/p CABG (1995) s/p subsequent PCI's, SSS s/p PPM, HTN, HLD, CKD (baseline Cr 1.2-1.5), Non-Hodgkin's lymphoma (s/p Chemo 2008-2009), prostate cancer (on Abiraterone & Prednisone since 04/2015), PE (bilatreral diagnosed 03/2015, started Xarelto, stopped Clopidogrel), & OSA on CPAP   Anticoagulation: Xarelto pta for hx PE (03/2015, last dose 1/27 @ 1100), now with chest pain, hold xarelto, start heparin. Hgb dropping to 7.9, FOBT +. Received PRBC. Hgb up to 9.9 today s/p  transfusion. HL this AM 0.36 with aPTT 68. Resume IV heparin post-EGD per Dr. Osborn Coho note 1/29. - 1/30: Hep off 0500 for EGD: 1. Small hiatal hernia 2. Otherwise within normal limits to the third portion of the duodenum without signs of GI bleeding   Goal of Therapy:  Heparin level 0.3-0.7 units/ml Monitor platelets by anticoagulation protocol: Yes   Plan:  Resume IV heparin at 1300 units/hr (previous rate) Daily HL and CBC   Edward Woodward, PharmD, BCPS Clinical Staff Pharmacist Pager [phone]  Eilene Ghazi Stillinger 08/26/2015,2:48 PM

## 2015-08-26 NOTE — Anesthesia Postprocedure Evaluation (Signed)
Anesthesia Post Note  Patient: Edward Woodward  Procedure(s) Performed: Procedure(s) (LRB): ESOPHAGOGASTRODUODENOSCOPY (EGD) WITH PROPOFOL (N/A) COLONOSCOPY (N/A)  Patient location during evaluation: PACU Anesthesia Type: MAC Level of consciousness: awake and alert Pain management: pain level controlled Vital Signs Assessment: post-procedure vital signs reviewed and stable Respiratory status: spontaneous breathing Cardiovascular status: stable Anesthetic complications: no    Last Vitals:  Filed Vitals:   08/26/15 1410 08/26/15 1500  BP: 167/78 148/56  Pulse: 110 65  Temp:  36.4 C  Resp: 20 20    Last Pain:  Filed Vitals:   08/26/15 1501  PainSc: 0-No pain                 Nolon Nations

## 2015-08-27 ENCOUNTER — Encounter (HOSPITAL_COMMUNITY): Payer: Self-pay | Admitting: Gastroenterology

## 2015-08-27 ENCOUNTER — Encounter (HOSPITAL_COMMUNITY)
Admission: EM | Disposition: A | Discharge status: Home / Self Care | Payer: Self-pay | Source: Home / Self Care | Attending: Cardiovascular Disease

## 2015-08-27 DIAGNOSIS — E785 Hyperlipidemia, unspecified: Secondary | ICD-10-CM

## 2015-08-27 DIAGNOSIS — I25110 Atherosclerotic heart disease of native coronary artery with unstable angina pectoris: Secondary | ICD-10-CM

## 2015-08-27 DIAGNOSIS — R300 Dysuria: Secondary | ICD-10-CM

## 2015-08-27 DIAGNOSIS — I2609 Other pulmonary embolism with acute cor pulmonale: Secondary | ICD-10-CM

## 2015-08-27 HISTORY — PX: CARDIAC CATHETERIZATION: SHX172

## 2015-08-27 LAB — CBC
HEMATOCRIT: 29.5 % — AB (ref 39.0–52.0)
HEMOGLOBIN: 9.6 g/dL — AB (ref 13.0–17.0)
MCH: 31.4 pg (ref 26.0–34.0)
MCHC: 32.5 g/dL (ref 30.0–36.0)
MCV: 96.4 fL (ref 78.0–100.0)
Platelets: 160 10*3/uL (ref 150–400)
RBC: 3.06 MIL/uL — ABNORMAL LOW (ref 4.22–5.81)
RDW: 17.4 % — ABNORMAL HIGH (ref 11.5–15.5)
WBC: 6.2 10*3/uL (ref 4.0–10.5)

## 2015-08-27 LAB — BASIC METABOLIC PANEL
ANION GAP: 6 (ref 5–15)
BUN: 13 mg/dL (ref 6–20)
CHLORIDE: 107 mmol/L (ref 101–111)
CO2: 27 mmol/L (ref 22–32)
Calcium: 8.2 mg/dL — ABNORMAL LOW (ref 8.9–10.3)
Creatinine, Ser: 1.21 mg/dL (ref 0.61–1.24)
GFR calc non Af Amer: 52 mL/min — ABNORMAL LOW (ref >60)
Glucose, Bld: 93 mg/dL (ref 65–99)
POTASSIUM: 3.6 mmol/L (ref 3.5–5.1)
Sodium: 140 mmol/L (ref 135–145)

## 2015-08-27 LAB — HEPARIN LEVEL (UNFRACTIONATED): Heparin Unfractionated: 0.21 IU/mL — ABNORMAL LOW (ref 0.30–0.70)

## 2015-08-27 LAB — POCT ACTIVATED CLOTTING TIME: ACTIVATED CLOTTING TIME: 147 s

## 2015-08-27 SURGERY — LEFT HEART CATH AND CORS/GRAFTS ANGIOGRAPHY

## 2015-08-27 MED ORDER — OXYCODONE-ACETAMINOPHEN 5-325 MG PO TABS: 1.0000 | ORAL_TABLET | ORAL | Status: DC | PRN

## 2015-08-27 MED ORDER — MIDAZOLAM HCL 2 MG/2ML IJ SOLN
INTRAMUSCULAR | Status: DC | PRN
  Administered 2015-08-27: 1 mg via INTRAVENOUS

## 2015-08-27 MED ORDER — HEPARIN SODIUM (PORCINE) 1000 UNIT/ML IJ SOLN
INTRAMUSCULAR | Status: DC | PRN
  Administered 2015-08-27: 2000 [IU] via INTRAVENOUS

## 2015-08-27 MED ORDER — FENTANYL CITRATE (PF) 100 MCG/2ML IJ SOLN
INTRAMUSCULAR | Status: DC | PRN
  Administered 2015-08-27: 50 ug via INTRAVENOUS

## 2015-08-27 MED ORDER — HEPARIN (PORCINE) IN NACL 100-0.45 UNIT/ML-% IJ SOLN: 1500.0000 [IU]/h | INTRAMUSCULAR | Status: DC

## 2015-08-27 MED ORDER — ATORVASTATIN CALCIUM 40 MG PO TABS
40.0000 mg | ORAL_TABLET | ORAL | Status: DC
  Administered 2015-08-28: 40 mg via ORAL
  Filled 2015-08-27: qty 1

## 2015-08-27 MED ORDER — FENTANYL CITRATE (PF) 100 MCG/2ML IJ SOLN
INTRAMUSCULAR | Status: AC
  Filled 2015-08-27: qty 2

## 2015-08-27 MED ORDER — HEPARIN (PORCINE) IN NACL 2-0.9 UNIT/ML-% IJ SOLN
INTRAMUSCULAR | Status: AC
  Filled 2015-08-27: qty 1000

## 2015-08-27 MED ORDER — ONDANSETRON HCL 4 MG/2ML IJ SOLN: 4.0000 mg | Freq: Four times a day (QID) | INTRAMUSCULAR | Status: DC | PRN

## 2015-08-27 MED ORDER — HEPARIN SODIUM (PORCINE) 1000 UNIT/ML IJ SOLN
INTRAMUSCULAR | Status: AC
  Filled 2015-08-27: qty 1

## 2015-08-27 MED ORDER — SODIUM CHLORIDE 0.9 % IV SOLN: 250.0000 mL | INTRAVENOUS | Status: DC | PRN

## 2015-08-27 MED ORDER — LIDOCAINE HCL (PF) 1 % IJ SOLN
INTRAMUSCULAR | Status: AC
  Filled 2015-08-27: qty 30

## 2015-08-27 MED ORDER — HEPARIN BOLUS VIA INFUSION
2000.0000 [IU] | Freq: Once | INTRAVENOUS | Status: AC
  Administered 2015-08-27: 2000 [IU] via INTRAVENOUS
  Filled 2015-08-27: qty 2000

## 2015-08-27 MED ORDER — ACETAMINOPHEN 325 MG PO TABS
650.0000 mg | ORAL_TABLET | ORAL | Status: DC | PRN
  Administered 2015-08-27: 650 mg via ORAL
  Filled 2015-08-27: qty 2

## 2015-08-27 MED ORDER — APIXABAN 5 MG PO TABS
5.0000 mg | ORAL_TABLET | Freq: Two times a day (BID) | ORAL | Status: DC
  Administered 2015-08-27 – 2015-08-28 (×2): 5 mg via ORAL
  Filled 2015-08-27: qty 2
  Filled 2015-08-27 (×2): qty 1

## 2015-08-27 MED ORDER — SODIUM CHLORIDE 0.9% FLUSH: 3.0000 mL | INTRAVENOUS | Status: DC | PRN

## 2015-08-27 MED ORDER — MIDAZOLAM HCL 2 MG/2ML IJ SOLN
INTRAMUSCULAR | Status: AC
  Filled 2015-08-27: qty 2

## 2015-08-27 MED ORDER — SODIUM CHLORIDE 0.9 % IV SOLN: INTRAVENOUS | Status: AC

## 2015-08-27 MED ORDER — IOHEXOL 350 MG/ML SOLN
INTRAVENOUS | Status: DC | PRN
  Administered 2015-08-27: 85 mL via INTRA_ARTERIAL

## 2015-08-27 MED ORDER — ISOSORBIDE MONONITRATE ER 30 MG PO TB24
30.0000 mg | ORAL_TABLET | Freq: Every day | ORAL | Status: DC
  Administered 2015-08-27 – 2015-08-28 (×2): 30 mg via ORAL
  Filled 2015-08-27 (×2): qty 1

## 2015-08-27 MED ORDER — SODIUM CHLORIDE 0.9% FLUSH
3.0000 mL | Freq: Two times a day (BID) | INTRAVENOUS | Status: DC
  Administered 2015-08-27 – 2015-08-28 (×5): 3 mL via INTRAVENOUS

## 2015-08-27 MED ORDER — LIDOCAINE HCL (PF) 1 % IJ SOLN
INTRAMUSCULAR | Status: DC | PRN
  Administered 2015-08-27: 10:00:00

## 2015-08-27 SURGICAL SUPPLY — 15 items
CATH INFINITI 5F JL4 125CM (CATHETERS) ×2 IMPLANT
CATH INFINITI 5FR JR4 125CM (CATHETERS) ×2 IMPLANT
CATH SITESEER 5F MULTI A 2 (CATHETERS) ×2 IMPLANT
KIT HEART LEFT (KITS) ×3 IMPLANT
PACK CARDIAC CATHETERIZATION (CUSTOM PROCEDURE TRAY) ×3 IMPLANT
PINNACLE LONG 5F 25CM (SHEATH) ×3
SHEATH INTROD PINNACLE 5F 25CM (SHEATH) IMPLANT
SHEATH PINNACLE 5F 10CM (SHEATH) ×2 IMPLANT
SHEATH PINNACLE 6F 10CM (SHEATH) ×2 IMPLANT
SHEATH PINNACLE ST 6F 45CM (SHEATH) ×2 IMPLANT
TRANSDUCER W/STOPCOCK (MISCELLANEOUS) ×3 IMPLANT
TUBING CIL FLEX 10 FLL-RA (TUBING) ×3 IMPLANT
WIRE EMERALD 3MM-J .035X150CM (WIRE) ×2 IMPLANT
WIRE EMERALD 3MM-J .035X260CM (WIRE) ×2 IMPLANT
WIRE ROSEN-J .035X260CM (WIRE) ×2 IMPLANT

## 2015-08-27 NOTE — Progress Notes (Signed)
59fr sheath aspirated and removed from rfa. Manual pressure applied for 20 minutes. Site rebled, manual pressure reapplied for additional 20 minutes. Groin level 0. Tegaderm dressing applied. Bedrest instructions given.  Bedrest begins at 11:14:00

## 2015-08-27 NOTE — Progress Notes (Signed)
PATIENT ID:  5M with CAD s/p CABG and PCI, SSS s/p PPM, HTN, HL, CKD, and non-Hodgkin's lymphoma, PE on Xarelto and OSA here with demand ischemia and acute anemia presumably from a GI source..  INTERVAL HISTORY: Upper endoscopy unremarkable other than hiatal hernia.  H/H stable since transfusion.    SUBJECTIVE:  Feeling well.  Ready to go home.  Denies chest pain or shortness of breath.   PHYSICAL EXAM Filed Vitals:   08/27/15 1330 08/27/15 1352 08/27/15 1400 08/27/15 1430  BP: 149/60 157/64 137/60 157/64  Pulse: 63  66 65  Temp:      TempSrc:      Resp:      Height:      Weight:      SpO2:       General:  Well-appearing elderly man in NAD. Neck: No JVD. Lungs:  CTAB.  No crackles, rhonchi or wheezes Heart:  RRR.  No m/r/g.  Normal S1/S2. Abdomen:  Soft, NT, ND.  +BS Extremities:  WWP.  No edema  LABS: Lab Results  Component Value Date   TROPONINI 0.07* 08/26/2015   Results for orders placed or performed during the hospital encounter of 08/23/15 (from the past 24 hour(s))  Troponin I     Status: Abnormal   Collection Time: 08/26/15  4:21 PM  Result Value Ref Range   Troponin I 0.07 (H) <0.031 ng/mL  CBC     Status: Abnormal   Collection Time: 08/27/15  4:44 AM  Result Value Ref Range   WBC 6.2 4.0 - 10.5 K/uL   RBC 3.06 (L) 4.22 - 5.81 MIL/uL   Hemoglobin 9.6 (L) 13.0 - 17.0 g/dL   HCT 29.5 (L) 39.0 - 52.0 %   MCV 96.4 78.0 - 100.0 fL   MCH 31.4 26.0 - 34.0 pg   MCHC 32.5 30.0 - 36.0 g/dL   RDW 17.4 (H) 11.5 - 15.5 %   Platelets 160 150 - 400 K/uL  Basic metabolic panel     Status: Abnormal   Collection Time: 08/27/15  4:44 AM  Result Value Ref Range   Sodium 140 135 - 145 mmol/L   Potassium 3.6 3.5 - 5.1 mmol/L   Chloride 107 101 - 111 mmol/L   CO2 27 22 - 32 mmol/L   Glucose, Bld 93 65 - 99 mg/dL   BUN 13 6 - 20 mg/dL   Creatinine, Ser 1.21 0.61 - 1.24 mg/dL   Calcium 8.2 (L) 8.9 - 10.3 mg/dL   GFR calc non Af Amer 52 (L) >60 mL/min   GFR calc Af  Amer >60 >60 mL/min   Anion gap 6 5 - 15  Heparin level (unfractionated)     Status: Abnormal   Collection Time: 08/27/15  4:44 AM  Result Value Ref Range   Heparin Unfractionated 0.21 (L) 0.30 - 0.70 IU/mL    Intake/Output Summary (Last 24 hours) at 08/27/15 1504 Last data filed at 08/27/15 0810  Gross per 24 hour  Intake    240 ml  Output    425 ml  Net   -185 ml    1. Ost RPDA lesion, 100% stenosed. 2. Ost RCA to Dist RCA lesion, 70% stenosed. The lesion was previously treated with a stent (unknown type). 3. Ost 1st Mrg to 1st Mrg lesion, 100% stenosed. 4. Ost 2nd Mrg lesion, 95% stenosed. 5. 2nd Mrg lesion, 75% stenosed. 6. Lat 2nd Mrg lesion, 80% stenosed. 7. Ost LAD lesion, 100% stenosed. 8. SVG was injected  is normal in caliber. 9. There is moderate disease in the graft. 10. was injected is normal in caliber. 11. There is moderate disease in the graft. 12. Prox Graft to Mid Graft lesion, 40% stenosed. The lesion was previously treated with a stent (unknown type). 13. Prox Graft to Dist Graft lesion, before 1st Mrg, 40% stenosed. The lesion was previously treated with a stent (unknown type). 14. Dist LAD lesion, 70% stenosed.   Angiography is nearly impossible to perform from the femoral approach due to marked iliofemoral tortuosity which require that 125 cm catheters were used and prevent catheter torque.  Widely patent sequential saphenous vein graft to the first and second obtuse marginal. Widely patent saphenous vein graft to the first diagonal.  Severe native vessel disease with high-grade diffuse significant stenoses throughout the native right some of which represents in-stent restenosis. Distal territory is meager. The PDA is totally occluded and supplied by left-to-right collaterals.  Left main coronary artery never selectively engaged due to tortuosity as described above and lack of appropriate length catheters.  Native left coronary system has shown chronic  total occlusion of the LAD in the past. The saphenous vein graft to the diagonal which supplies the LAD is widely patent.  The native circumflex territory contains high-grade obstruction proximal to the graft insertion site in the second obtuse marginal and thus threatens a large third obtuse marginal branch. There is antegrade significant disease beyond the graft insertion site and obtuse marginal 2. The proximal native circumflex ostial and proximal segment was stented greater than 7 years ago but we will unable to visualize (as mentioned above under left main).  RECOMMENDATIONS:   Source of angina could be the native right coronary, or the circumflex territory. These territories by necessity will have to be treated with medical therapy.  At this point he would not be a candidate for dual antiplatelet and anticoagulation therapy. I would add either aspirin or Plavix to anticoagulation therapy if GI feels this would be safe to do. Otherwise we may have to settle for continued anticoagulation with Xarelto alone.   ASSESSMENT AND PLAN:  Active Problems:   Coronary atherosclerosis of native coronary artery   Essential hypertension, benign   OSA on CPAP   Cardiac pacemaker -[employer]   Pulmonary embolus (HCC)   Chest pain   Chronic systolic heart failure (HCC)   Pain in the chest   NSTEMI (non-ST elevated myocardial infarction) (HCC)   Iron deficiency anemia due to chronic blood loss   Xarelto had a lower risk of overall bleeding than warfarin but a higher risk of GI bleeding.   Apixaban- lower bleeding risk than warfarin  Chest pain/NSTEMI: Edward Woodward has complex coronary disease that is not amenable to intervention.  Unfortunately, given his anemia requiring transfusion, DAPT is not an option for now.  He has tolerated aspirin and heparin during admission.  We will plan for aspirin and Apixaban.  If h/h drops, will stop aspirin.  LDL 80.  Will increase atorvastatin to 40mg .   Will start imdur 30 mg daily.  Acute on chronic systolic CHF/ICM: (EF Q000111Q per Carlton Adam 01/2014) --> now EF completely normalized - Felt to be mildy volume overloaded on admission and given Lasix 20 mg IV x 2. Net neg 431mL. No daily weights done. Will add this. He is feeling much better after diuresis. Euvolemic today.  Continue home lasix and metoprolol.   Acute anemia: H/H improved after transfusion and has been stable. No source found on upper  endoscopy.  FOBT + . Iron studies have come back in normal range, going against anemia being the result of chronic long-term GI bld loss.  Will start Apixaban, given its favorable bleeding profile compared with warfarin.  Xarelto has lower risk of major bleed than warfarin but higher risk of GI bleed.  Stop heparin when apixaban is started.  PPI was started by GI.  CKD: creat had improved with diuresis 1.53--> 1.2.  SSS s/p PPM ([employer]) - Device interrogated in the ED. He is 97% A paced. No Ventricular arrhythmias.   HTN - BP remains elevated.  Continue metoprolol and add Imdur.  If BP remains elevated can switch to carvedilol, as heart rate is in the 60s.  PE - As per above, will transition from heparin to Apixaban.  Prostate Cancer - We will continue his home Prednisone & Zytiga.   OSA on CPAP  Edward Woodward C. Oval Linsey, MD, [facility] 08/27/2015 3:04 PM

## 2015-08-27 NOTE — Care Management Important Message (Signed)
Important Message  Patient Details  Name: Edward Woodward MRN: [id number] Date of Birth: 08-02-1926   Medicare Important Message Given:  Yes    [name] P [place] 08/27/2015, 3:17 PM

## 2015-08-27 NOTE — Progress Notes (Signed)
Pt on CPAP at this time on arrival. Pt sets it up per self. Family at bedside. Pt is stable at this time.

## 2015-08-27 NOTE — Progress Notes (Signed)
[name] 12:59 PM  Subjective: Patient without any postprocedural problems and his case was reviewed with the family as well and we discussed his cath today and answered all of their questions  Objective: Vital signs stable afebrile no acute distress patient not examined today looks stable hemoglobin okay  Assessment: GI bleeding on blood thinners  Plan: Please let us know if we could be of any further assistance with this hospital stay and we are happy to see back when necessary but no further GI workup at this time and resume blood thinners per cardiology but minimize aspirin and nonsteroidals as an outpatient  [facility] E  Pager [phone] After 5PM or if no answer call [phone]

## 2015-08-27 NOTE — H&P (View-Only) (Signed)
Patient Name: Edward Woodward Date of Encounter: 08/26/2015     Active Problems:   Coronary atherosclerosis of native coronary artery   Essential hypertension, benign   OSA on CPAP   Cardiac pacemaker -[employer]   Pulmonary embolus [facility])   Chest pain   Chronic systolic heart failure (HCC)   Pain in the chest   NSTEMI (non-ST elevated myocardial infarction) (HCC)   Iron deficiency anemia due to chronic blood loss    SUBJECTIVE  No more CP. Awaiting colonoscopy and EGD today.   CURRENT MEDS . abiraterone Acetate  1,000 mg Oral QAC breakfast  . aspirin  81 mg Oral Daily  . atorvastatin  20 mg Oral QODAY  . citalopram  20 mg Oral Daily  . folic acid  1 mg Oral Daily  . gabapentin  300 mg Oral BID  . metoprolol succinate  25 mg Oral Daily  . nitroGLYCERIN  0.5 inch Topical 4 times per day  . pantoprazole  40 mg Oral BID AC  . predniSONE  5 mg Oral Q breakfast  . sodium chloride flush  3 mL Intravenous Q12H    OBJECTIVE  Filed Vitals:   08/25/15 2035 08/25/15 2107 08/25/15 2306 08/26/15 0426  BP: 147/56 136/68 157/65 156/64  Pulse: 65 68 66 65  Temp: 97.7 F (36.5 C) 97.7 F (36.5 C) 97.6 F (36.4 C) 97.8 F (36.6 C)  TempSrc: Oral Oral Oral Oral  Resp: 18 18 16 18   Height:      Weight:      SpO2: 98% 99% 97% 96%    Intake/Output Summary (Last 24 hours) at 08/26/15 0909 Last data filed at 08/25/15 2306  Gross per 24 hour  Intake    480 ml  Output    375 ml  Net    105 ml   Filed Weights   08/24/15 0015  Weight: 225 lb 5 oz (102.2 kg)    PHYSICAL EXAM  General: Pleasant, NAD. Obese, chronically ill appear.  Neuro: Alert and oriented X 3. Moves all extremities spontaneously. Psych: Normal affect. HEENT:  Normal  Neck: Supple without bruits or JVD. Lungs:  Resp regular and unlabored, CTA. Heart: RRR no s3, s4, or murmurs. Abdomen: Soft, non-tender, non-distended, BS + x 4.  Extremities: No clubbing, cyanosis or 1+ bilateral edema.  DP/PT/Radials 2+ and equal bilaterally.  Accessory Clinical Findings  CBC  Recent Labs  08/23/15 1810  08/25/15 0034 08/26/15 0444  WBC 6.3  < > 6.9 7.0  NEUTROABS 3.6  --   --  4.4  HGB 9.1*  < > 7.9* 9.9*  HCT 29.2*  < > 25.2* 30.7*  MCV 97.3  < > 98.1 95.0  PLT 178  < > 161 165  < > = values in this interval not displayed. Basic Metabolic Panel  Recent Labs  08/23/15 1810 08/24/15 1200  NA 141 143  K 4.2 3.7  CL 103 103  CO2 28 29  GLUCOSE 114* 132*  BUN 27* 24*  CREATININE 1.53* 1.41*  CALCIUM 9.6 9.3   Liver Function Tests  Recent Labs  08/23/15 1810  AST 20  ALT 14*  ALKPHOS 48  BILITOT 1.3*  PROT 6.1*  ALBUMIN 3.2*   No results for input(s): LIPASE, AMYLASE in the last 72 hours. Cardiac Enzymes  Recent Labs  08/24/15 0830 08/24/15 1540 08/24/15 2327  TROPONINI 0.27* 0.21* 0.14*   Fasting Lipid Panel  Recent Labs  08/24/15 0343  CHOL 139  HDL  32*  LDLCALC 80  TRIG 134  CHOLHDL 4.3    TELE  A paced with freq PVCS.   Radiology/Studies  Dg Chest Portable 1 View  08/23/2015  CLINICAL DATA:  Chest tightness, shortness of breath for 1 day EXAM: PORTABLE CHEST 1 VIEW COMPARISON:  03/31/2015 FINDINGS: Prior CABG. Left pacer in stable position. Heart and mediastinal contours are within normal limits. No focal opacities or effusions. No acute bony abnormality. IMPRESSION: No active cardiopulmonary disease. Electronically Signed   By: Rolm Baptise M.D.   On: 08/23/2015 17:57   2D ECHO: 08/25/2015 LV EF: 60% -  65% Study Conclusions - Left ventricle: The cavity size was normal. There was moderate concentric hypertrophy. Systolic function was normal. The estimated ejection fraction was in the range of 60% to 65%. Wall motion was normal; there were no regional wall motion abnormalities. Doppler parameters are consistent with abnormal left ventricular relaxation (grade 1 diastolic dysfunction). There was no evidence of elevated  ventricular filling pressure by Doppler parameters. - Aortic valve: There was mild stenosis. There was mild regurgitation. Mean gradient (S): 11 mm Hg. Peak gradient (S): 18 mm Hg. Valve area (VTI): 2.29 cm^2. Valve area (Vmax): 2.09 cm^2. Valve area (Vmean): 1.78 cm^2. - Ascending aorta: The ascending aorta was mildly dilated. - Mitral valve: Structurally normal valve. - Left atrium: The atrium was mildly dilated. - Right ventricle: Systolic function was normal. - Right atrium: The atrium was normal in size. - Tricuspid valve: There was mild regurgitation. - Pulmonary arteries: Systolic pressure was within the normal range. - Inferior vena cava: The vessel was normal in size. - Pericardium, extracardiac: There was no pericardial effusion. Impressions: - Normal LVEF, no new regional wall motion abnormalities are seen.   ASSESSMENT AND PLAN DILPREET MODESTO is a 82 y.o. male with a history of CAD s/p CABG (1995) s/p subsequent PCI's (BMS to SVG-Diag 2007, BMS to SVG-Diag 2008, DES to LCx 2011), SSS s/p PPM, HTN, HLD, CKD (baseline Cr 1.2-1.5), Non-Hodgkin's lymphoma (s/p Chemo 2008-2009), prostate cancer (on Abiraterone & Prednisone since 04/2015), PE (bilateral diagnosed 03/2015, started Xarelto, stopped Clopidogrel), & OSA on CPAP who presented 08/23/15 with dizziness and weakness and later chest pain.   Chest pain/NSTEMI - Given his simultaneous discontinuation of ASA & Clopidogrel during the time of Xarelto initiation, his stent patency is concerning - His troponin was elevated 0.03 --> 0.18 --> 0.27--> 0.21--> 0.14; however worsening anemia since started on anticoagulation 11.5 --> 9.1 --> 8.7. --> 7.9. Troponin elevation is low and flat. This could be c/w demand ischemia in the setting of anemia - GI has seen and he is undergoing EGD and colonoscopy today. The last dose of xarelto was 08/23/15 in the am and he was started on a heparin drip. ASA also restarted ( with PPI).  - TTE  08/25/15 with normal EF and no WMA, which is reassuring  Acute on chronic systolic CHF/ICM: (EF Q000111Q per Carlton Adam 01/2014) --> now EF completely normalized - Felt to be mildy volume overloaded on admission and given Lasix 20 mg IV x 2. Net neg 454mL. No daily weights done. Will add this.  He is feeling much better after diuresis. His lungs sound clear. I will add back his home lasix ( wife says taking 20mg  daily at home).  Acute anemia: H/H improved after transfusion. FOBT + . Iron studies have come back in normal range, going against anemia being the result of chronic long-term GI bld loss, but rasing the question  of (?unrecognized) subacute bleeding since being started on Xarelto. GI planning EGD/colonoscopy as above.  CKD: creat had improved with diuresis 1.53--> 1.41. No new labs. Will add daily BMETs.   SSS s/p PPM ([employer]) - The ED was in the process of having his device interrogated to investigate whether arrhythmia contributed to his presentation.I have contacted the [facility] rep to get a copy of the interrogation. He is 97% A paced. No Ventricular arrhythmias.   HTN - BP mildly elevated today. Continue home Toprol XL 25.   PE - As per above, transitioned from Xarelto to Heparin.  Prostate Cancer - We will continue his home Prednisone & Zytiga.   OSA on CPAP  Signed, Eileen Stanford PA-C  Pager [phone]

## 2015-08-27 NOTE — Progress Notes (Signed)
IP PROGRESS NOTE  Subjective:   Edward Woodward reports no further bleeding. He complains of difficulty urinating.  Objective: Vital signs in last 24 hours: Blood pressure 154/62, pulse 65, temperature 97.4 F (36.3 C), temperature source Oral, resp. rate 16, height 5\' 10"  (1.778 m), weight 221 lb 6.4 oz (100.426 kg), SpO2 95 %.  Intake/Output from previous day: 01/30 0701 - 01/31 0700 In: 680 [P.O.:480; I.V.:200] Out: 425 [Urine:425]  Physical Exam: Not performed today  Lab Results:  Recent Labs  08/26/15 0444 08/27/15 0444  WBC 7.0 6.2  HGB 9.9* 9.6*  HCT 30.7* 29.5*  PLT 165 160    BMET  Recent Labs  08/26/15 0444 08/27/15 0444  NA 143 140  K 3.7 3.6  CL 106 107  CO2 28 27  GLUCOSE 102* 93  BUN 16 13  CREATININE 1.35* 1.21  CALCIUM 8.5* 8.2*    Studies/Results: No results found.  Medications: I have reviewed the patient's current medications.  Assessment/Plan:  1. Non-Hodgkin's lymphoma (follicular grade 3 lymphoma) - status post 6 cycles of Cytoxan/prednisone/rituximab 07/01/2007 through 10/21/2007. He remains in clinical remission. 2. CT chest 04/01/2015 with no lymphadenopathy  CT abdomen/pelvis 05/01/2015 with mild progression of abdomen/pelvic lymphadenopathy 3. Prostate cancer - he has been treated with hormonal therapy, the PSA was stable on 10/15/2014  PSA increased 04/26/2015   Bone scan 05/09/2015 with unchanged increased activity in the thoracic and lumbar spine felt to most likely be degenerative.   Initiation of Abiraterone and prednisone 05/11/2015. Normalization of the PSA on Abiraterone. 4. History of a normocytic anemia secondary to non-Hodgkin's lymphoma, chemotherapy, and renal insufficiency - progressive secondary to GI bleeding 5. History of Mild thrombocytopenia  6. History of bilateral hydronephrosis. Renal ultrasound 09/26/2014 consistent with medical renal disease. No hydronephrosis. 7. Coronary artery disease - status post  coronary artery stent placement. 8. History of noncalcified lung nodules. 9. History of severe neutropenia July 2009 - likely related to delayed toxicity from rituximab. 10. Macular degeneration. 11. Right middle lobe density on a CT of the chest 06/18/2010 measuring 2.5 x 1.6 cm with mildly increased metabolic activity (SUV max 2.5) on a PET scan 06/27/2010. The area of increased density was less confluent and appeared smaller on a restaging CT 09/08/2010. Right middle lobe "scarring" with no new or suspicious pulmonary nodule on a CT 08/04/2011. 12. Mild increased metabolic activity associated with several lymph nodes on the PET scan 06/27/2010 - likely related to lymphoma. No lymphadenopathy in the mediastinum or axillary areas on the CT 08/04/2011. 13. Right hip discomfort-he received a steroid injection by his primary physician without improvement. He saw Dr. Collier Salina and there was a question of a lytic process in the right pelvis. A bone scan on 09/21/2012 revealed no evidence of metastatic disease. 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 Renal insufficiency 15. Left leg DVT and pulmonary embolism 04/01/2015-maintained on xarelto 16. Hypercalcemia-status post Zometa 04/26/2015, improved 17. Admission 08/23/2015 with severe anemia and GI bleeding  Upper and lower endoscopy 08/26/2015 without a clear source for bleeding identified  Mr. Sorkin appears stable. The hemoglobin is unchanged today. I discussed the risk/benefit of continuing anticoagulation therapy with Mr. Blatz and his wife. I recommend permanently discontinuing Xarelto. We discussed Coumadin anticoagulation versus following him off of anticoagulation therapy. He would like to begin a trial of Coumadin.  The Coumadin can be managed at the [facility] or be of the cardiology anticoagulation clinic.  Recommendations: 1. Discontinue Xarelto 2.  Coumadin anticoagulation per pharmacy protocol with  a goal INR of 2 3. Continue Abiraterone/prednisone 4. Outpatient follow-up as scheduled at the Cancer center.     LOS: 3 days   Anaise Sterbenz  08/27/2015, 1:40 PM

## 2015-08-27 NOTE — Progress Notes (Signed)
ANTICOAGULATION CONSULT NOTE - Follow Up Consult  Pharmacy Consult for heparin Indication: chest pain/ACS and pulmonary embolus   Labs:  Recent Labs  08/24/15 1200 08/24/15 1540  08/24/15 1833 08/24/15 2327  08/25/15 0034 08/25/15 0334 08/26/15 0444 08/26/15 1621 08/27/15 0444  HGB  --   --   --   --   --   < > 7.9*  --  9.9*  --  9.6*  HCT  --   --   --   --   --   --  25.2*  --  30.7*  --  29.5*  PLT  --   --   --   --   --   --  161  --  165  --  160  APTT  --   --   --  71*  --   --   --  88* 68*  --   --   HEPARINUNFRC  --   --   < > 0.88*  --   --   --  0.94* 0.36  --  0.21*  CREATININE 1.41*  --   --   --   --   --   --   --  1.35*  --   --   TROPONINI  --  0.21*  --   --  0.14*  --   --   --   --  0.07*  --   < > = values in this interval not displayed.   Assessment: 82yo male now subtherapeutic on heparin after resumed at rate that resulted in one low-therapeutic level.  Goal of Therapy:  Heparin level 0.3-0.7 units/ml   Plan:  Will bolus with heparin 2000 units and increase gtt by 2 units/kg/hr to 1500 units/hr and f/u after cath.  Wynona Neat, PharmD, BCPS  08/27/2015,6:49 AM

## 2015-08-27 NOTE — Progress Notes (Signed)
UR Completed. Renika Shiflet, RN, BSN.  [phone] 

## 2015-08-27 NOTE — Consult Note (Signed)
   [facility] CM Inpatient Consult   08/27/2015  SHIVAM MESTAS Nov 29, 1926 [id number] Patient was screened for HF/COPD  to assess for care management services. [facility] Care Management is a covered benefit of  Medicare insurance.   Met with the patient and wife at bedside regarding the benefits of [facility] Care Management services. Review information for [facility] Care Management and a brochure was provided with contact information.  Explained that [facility] does not interfere with or replace any services arranged by the inpatient care management staff.  Patient declined services with [facility].  Wife states "his daughter is a Equities trader and he's been getting along pretty good.  I feel like once we get him on the right anti-coagulants he will be fine."  A brochure and information regarding [facility] Care Management was provided. For questions, please contact: Natividad Brood, RN BSN [facility] Liaison  [phone] business mobile phone Toll free office [phone]

## 2015-08-27 NOTE — Progress Notes (Signed)
ANTICOAGULATION CONSULT NOTE - Follow Up Consult  Pharmacy Consult for Heparin  Indication: h/o PE  Allergies  Allergen Reactions  . Penicillins Swelling  . Simvastatin Other (See Comments)    Muscle weakness in legs ?    Patient Measurements: Height: 5\' 10"  (177.8 cm) Weight: 221 lb 6.4 oz (100.426 kg) IBW/kg (Calculated) : 73 Heparin Dosing Weight: 95 kg  Vital Signs: Temp: 97.4 F (36.3 C) (01/31 0536) Temp Source: Oral (01/31 0536) BP: 154/62 mmHg (01/31 1140) Pulse Rate: 65 (01/31 1140)  Labs:  Recent Labs  08/24/15 1540  08/24/15 1833 08/24/15 2327  08/25/15 0034 08/25/15 0334 08/26/15 0444 08/26/15 1621 08/27/15 0444  HGB  --   --   --   --   < > 7.9*  --  9.9*  --  9.6*  HCT  --   --   --   --   --  25.2*  --  30.7*  --  29.5*  PLT  --   --   --   --   --  161  --  165  --  160  APTT  --   --  71*  --   --   --  88* 68*  --   --   HEPARINUNFRC  --   < > 0.88*  --   --   --  0.94* 0.36  --  0.21*  CREATININE  --   --   --   --   --   --   --  1.35*  --  1.21  TROPONINI 0.21*  --   --  0.14*  --   --   --   --  0.07*  --   < > = values in this interval not displayed.  Estimated Creatinine Clearance: 52.1 mL/min (by C-G formula based on Cr of 1.21).    Assessment: CC: chest pain  PMH: h/o CAD s/p CABG (1995) s/p subsequent PCI's, SSS s/p PPM, HTN, HLD, CKD (baseline Cr 1.2-1.5), Non-Hodgkin's lymphoma (s/p Chemo 2008-2009), prostate cancer (on Abiraterone & Prednisone since 04/2015), PE (bilatreral diagnosed 03/2015, started Xarelto, stopped Clopidogrel), & OSA on CPAP   Anticoagulation: Xarelto PtA for hx PE (03/2015, last dose 1/27 @ 1100), now with chest pain, hold xarelto, start heparin. Hgb dropping to 7.9, FOBT +. Received PRBC. 1/30 EGD non-revealing. Resume heparin post-cath 1/31 for h/o PE. Heparin level 0.21 low this AM. Hgb up to 9.6 - 1/31 Cath: Source of angina could be the native right coronary, or the circumflex territory. Medical treatment.  Possibly add ASA or Plavix to Xarelto. Femoral sheath out 1122.  Goal of Therapy:  Heparin level 0.3-0.7 units/ml Monitor platelets by anticoagulation protocol: Yes   Plan:  At [place], resume IV heparin at 1500 units/hr Daily HL and CBC   Amor Hyle S. Alford Highland, PharmD, BCPS Clinical Staff Pharmacist Pager [phone]  Eilene Ghazi Stillinger 08/27/2015,12:04 PM

## 2015-08-27 NOTE — Interval H&P Note (Signed)
Cath Lab Visit (complete for each Cath Lab visit)  Clinical Evaluation Leading to the Procedure:   ACS: Yes.    Non-ACS:    Anginal Classification: CCS IV  Anti-ischemic medical therapy: Maximal Therapy (2 or more classes of medications)  Non-Invasive Test Results: No non-invasive testing performed  Prior CABG: No previous CABG      History and Physical Interval Note:  08/27/2015 9:15 AM  Edward Woodward  has presented today for surgery, with the diagnosis of cp  The various methods of treatment have been discussed with the patient and family. After consideration of risks, benefits and other options for treatment, the patient has consented to  Procedure(s): Left Heart Cath and Cors/Grafts Angiography (N/A) as a surgical intervention .  The patient's history has been reviewed, patient examined, no change in status, stable for surgery.  I have reviewed the patient's chart and labs.  Questions were answered to the patient's satisfaction.     Sinclair Grooms

## 2015-08-27 NOTE — Progress Notes (Signed)
ANTICOAGULATION CONSULT NOTE - Follow Up Consult  Pharmacy Consult for  Eliquis Indication: h/o PE  Allergies  Allergen Reactions  . Penicillins Swelling  . Simvastatin Other (See Comments)    Muscle weakness in legs ?    Patient Measurements: Height: 5\' 10"  (177.8 cm) Weight: 221 lb 6.4 oz (100.426 kg) IBW/kg (Calculated) : 73 Heparin Dosing Weight: 95 kg  Vital Signs: Temp: 97.4 F (36.3 C) (01/31 0536) Temp Source: Oral (01/31 0536) BP: 157/64 mmHg (01/31 1430) Pulse Rate: 65 (01/31 1430)  Labs:  Recent Labs  08/24/15 1833 08/24/15 2327  08/25/15 0034 08/25/15 0334 08/26/15 0444 08/26/15 1621 08/27/15 0444  HGB  --   --   < > 7.9*  --  9.9*  --  9.6*  HCT  --   --   --  25.2*  --  30.7*  --  29.5*  PLT  --   --   --  161  --  165  --  160  APTT 71*  --   --   --  88* 68*  --   --   HEPARINUNFRC 0.88*  --   --   --  0.94* 0.36  --  0.21*  CREATININE  --   --   --   --   --  1.35*  --  1.21  TROPONINI  --  0.14*  --   --   --   --  0.07*  --   < > = values in this interval not displayed.  Estimated Creatinine Clearance: 52.1 mL/min (by C-G formula based on Cr of 1.21).    Assessment: 46 YOM on Xarelto PtA for hx PE (03/2015, last dose 1/27 @ 1100), admitted with chest pain and acute anemia presumably from GIB. Now s/p cath, plan for medical therapy. Will switch xarelto to eliquis given lower GIB risk. Originally the plan was to restart heparin 8 hrs after sheath removal which will be ~ 1930. Est. crcl ~ 50 ml/min.   Goal of Therapy:  Monitor platelets by anticoagulation protocol: Yes   Plan:  Eliquis 5mg  po BID first dose tonight at 2000 No need to restart heparin.  Maryanna Shape, PharmD, BCPS  Clinical Pharmacist  Pager: [phone]   08/27/2015,4:07 PM

## 2015-08-27 NOTE — Progress Notes (Signed)
Pt has returned from cardiac cath. Access in the right groin is a level 0. Pt's vitals are stable and his vitals are being monitored every 15 minutes. Pt is resting in bed at this time. Bedrest started at 11:14am. Will continue to monitor.  Grant Fontana RN, BSN

## 2015-08-28 LAB — CBC
HEMATOCRIT: 25.4 % — AB (ref 39.0–52.0)
Hemoglobin: 8.3 g/dL — ABNORMAL LOW (ref 13.0–17.0)
MCH: 31.7 pg (ref 26.0–34.0)
MCHC: 32.7 g/dL (ref 30.0–36.0)
MCV: 96.9 fL (ref 78.0–100.0)
Platelets: 151 10*3/uL (ref 150–400)
RBC: 2.62 MIL/uL — ABNORMAL LOW (ref 4.22–5.81)
RDW: 17 % — AB (ref 11.5–15.5)
WBC: 5.7 10*3/uL (ref 4.0–10.5)

## 2015-08-28 LAB — BASIC METABOLIC PANEL
Anion gap: 7 (ref 5–15)
BUN: 11 mg/dL (ref 6–20)
CALCIUM: 7.8 mg/dL — AB (ref 8.9–10.3)
CO2: 24 mmol/L (ref 22–32)
CREATININE: 1.2 mg/dL (ref 0.61–1.24)
Chloride: 111 mmol/L (ref 101–111)
GFR calc non Af Amer: 53 mL/min — ABNORMAL LOW (ref >60)
GLUCOSE: 103 mg/dL — AB (ref 65–99)
Potassium: 3.9 mmol/L (ref 3.5–5.1)
Sodium: 142 mmol/L (ref 135–145)

## 2015-08-28 LAB — URINALYSIS, ROUTINE W REFLEX MICROSCOPIC
Bilirubin Urine: NEGATIVE
GLUCOSE, UA: NEGATIVE mg/dL
HGB URINE DIPSTICK: NEGATIVE
Ketones, ur: NEGATIVE mg/dL
LEUKOCYTES UA: NEGATIVE
Nitrite: NEGATIVE
PH: 6.5 (ref 5.0–8.0)
Protein, ur: NEGATIVE mg/dL
SPECIFIC GRAVITY, URINE: 1.005 (ref 1.005–1.030)

## 2015-08-28 LAB — HEMOGLOBIN AND HEMATOCRIT, BLOOD
HCT: 29.1 % — ABNORMAL LOW (ref 39.0–52.0)
Hemoglobin: 9.5 g/dL — ABNORMAL LOW (ref 13.0–17.0)

## 2015-08-28 MED ORDER — APIXABAN 5 MG PO TABS: 5.0000 mg | ORAL_TABLET | Freq: Two times a day (BID) | ORAL | Status: DC

## 2015-08-28 MED ORDER — FUROSEMIDE 20 MG PO TABS: 20.0000 mg | ORAL_TABLET | Freq: Every day | ORAL | Status: DC

## 2015-08-28 MED ORDER — ISOSORBIDE MONONITRATE ER 30 MG PO TB24: 30.0000 mg | ORAL_TABLET | Freq: Every day | ORAL | Status: DC

## 2015-08-28 MED ORDER — FUROSEMIDE 10 MG/ML IJ SOLN
40.0000 mg | Freq: Once | INTRAMUSCULAR | Status: AC
  Administered 2015-08-28: 40 mg via INTRAVENOUS
  Filled 2015-08-28: qty 4

## 2015-08-28 MED ORDER — ATORVASTATIN CALCIUM 40 MG PO TABS: 40.0000 mg | ORAL_TABLET | ORAL | Status: DC

## 2015-08-28 MED ORDER — NITROGLYCERIN 0.4 MG/SPRAY TL SOLN: 1.0000 | Status: AC | PRN

## 2015-08-28 MED ORDER — FOLIC ACID 1 MG PO TABS: 1.0000 mg | ORAL_TABLET | Freq: Every day | ORAL | Status: DC

## 2015-08-28 MED ORDER — PANTOPRAZOLE SODIUM 40 MG PO TBEC: 40.0000 mg | DELAYED_RELEASE_TABLET | Freq: Two times a day (BID) | ORAL | Status: DC

## 2015-08-28 NOTE — Progress Notes (Signed)
PATIENT ID:  30M with CAD s/p CABG and PCI, SSS s/p PPM, HTN, HL, CKD, and non-Hodgkin's lymphoma, PE on Xarelto and OSA here with demand ischemia and acute anemia presumably from a GI source..  INTERVAL HISTORY: Heparin was discontinued and Eliquis was started.  Hemoglobin decreased from 9.6 to 8.3.  SUBJECTIVE:  Denies any chest pain or shortness of breath.  He also denies any bleeding, including melena or hematochezia.   PHYSICAL EXAM Filed Vitals:   08/27/15 1400 08/27/15 1430 08/27/15 2011 08/28/15 0442  BP: 137/60 157/64 121/57 129/47  Pulse: 66 65 64 64  Temp:   97.2 F (36.2 C) 97.4 F (36.3 C)  TempSrc:   Oral Axillary  Resp:   18 18  Height:      Weight:    102.286 kg (225 lb 8 oz)  SpO2:   97% 99%   General:  Well-appearing elderly man in NAD. Neck: No JVD sitting upright.  Lungs:  CTAB.  Bibasilar crackles.   No rhonchi or wheezes Heart:  RRR.  No m/r/g.  Normal S1/S2. Abdomen:  Soft, NT, ND.  +BS Extremities:  WWP.  2+ pitting edema to the upper tibia bilaterally.  LABS: Lab Results  Component Value Date   TROPONINI 0.07* 08/26/2015   Results for orders placed or performed during the hospital encounter of 08/23/15 (from the past 24 hour(s))  CBC     Status: Abnormal   Collection Time: 08/28/15  3:30 AM  Result Value Ref Range   WBC 5.7 4.0 - 10.5 K/uL   RBC 2.62 (L) 4.22 - 5.81 MIL/uL   Hemoglobin 8.3 (L) 13.0 - 17.0 g/dL   HCT 25.4 (L) 39.0 - 52.0 %   MCV 96.9 78.0 - 100.0 fL   MCH 31.7 26.0 - 34.0 pg   MCHC 32.7 30.0 - 36.0 g/dL   RDW 17.0 (H) 11.5 - 15.5 %   Platelets 151 150 - 400 K/uL  Basic metabolic panel     Status: Abnormal   Collection Time: 08/28/15  3:30 AM  Result Value Ref Range   Sodium 142 135 - 145 mmol/L   Potassium 3.9 3.5 - 5.1 mmol/L   Chloride 111 101 - 111 mmol/L   CO2 24 22 - 32 mmol/L   Glucose, Bld 103 (H) 65 - 99 mg/dL   BUN 11 6 - 20 mg/dL   Creatinine, Ser 1.20 0.61 - 1.24 mg/dL   Calcium 7.8 (L) 8.9 - 10.3 mg/dL   GFR calc non Af Amer 53 (L) >60 mL/min   GFR calc Af Amer >60 >60 mL/min   Anion gap 7 5 - 15    Intake/Output Summary (Last 24 hours) at 08/28/15 1049 Last data filed at 08/28/15 0845  Gross per 24 hour  Intake    356 ml  Output      0 ml  Net    356 ml    1. Ost RPDA lesion, 100% stenosed. 2. Ost RCA to Dist RCA lesion, 70% stenosed. The lesion was previously treated with a stent (unknown type). 3. Ost 1st Mrg to 1st Mrg lesion, 100% stenosed. 4. Ost 2nd Mrg lesion, 95% stenosed. 5. 2nd Mrg lesion, 75% stenosed. 6. Lat 2nd Mrg lesion, 80% stenosed. 7. Ost LAD lesion, 100% stenosed. 8. SVG was injected is normal in caliber. 9. There is moderate disease in the graft. 10. was injected is normal in caliber. 11. There is moderate disease in the graft. 12. Prox Graft to Mid Graft lesion,  40% stenosed. The lesion was previously treated with a stent (unknown type). 13. Prox Graft to Dist Graft lesion, before 1st Mrg, 40% stenosed. The lesion was previously treated with a stent (unknown type). 14. Dist LAD lesion, 70% stenosed.   Angiography is nearly impossible to perform from the femoral approach due to marked iliofemoral tortuosity which require that 125 cm catheters were used and prevent catheter torque.  Widely patent sequential saphenous vein graft to the first and second obtuse marginal. Widely patent saphenous vein graft to the first diagonal.  Severe native vessel disease with high-grade diffuse significant stenoses throughout the native right some of which represents in-stent restenosis. Distal territory is meager. The PDA is totally occluded and supplied by left-to-right collaterals.  Left main coronary artery never selectively engaged due to tortuosity as described above and lack of appropriate length catheters.  Native left coronary system has shown chronic total occlusion of the LAD in the past. The saphenous vein graft to the diagonal which supplies the LAD is widely  patent.  The native circumflex territory contains high-grade obstruction proximal to the graft insertion site in the second obtuse marginal and thus threatens a large third obtuse marginal branch. There is antegrade significant disease beyond the graft insertion site and obtuse marginal 2. The proximal native circumflex ostial and proximal segment was stented greater than 7 years ago but we will unable to visualize (as mentioned above under left main).  RECOMMENDATIONS:   Source of angina could be the native right coronary, or the circumflex territory. These territories by necessity will have to be treated with medical therapy.  At this point he would not be a candidate for dual antiplatelet and anticoagulation therapy. I would add either aspirin or Plavix to anticoagulation therapy if GI feels this would be safe to do. Otherwise we may have to settle for continued anticoagulation with Xarelto alone.   ASSESSMENT AND PLAN:  Active Problems:   Coronary atherosclerosis of native coronary artery   Essential hypertension, benign   OSA on CPAP   Cardiac pacemaker -[employer]   Pulmonary embolus (HCC)   Chest pain   Chronic systolic heart failure (HCC)   Pain in the chest   NSTEMI (non-ST elevated myocardial infarction) (HCC)   Iron deficiency anemia due to chronic blood loss   Xarelto had a lower risk of overall bleeding than warfarin but a higher risk of GI bleeding.   Apixaban- lower bleeding risk than warfarin  Chest pain/NSTEMI: Mr. Nations has complex coronary disease that is not amenable to intervention.  Unfortunately, given his anemia requiring transfusion, DAPT is not an option for now.  He has tolerated heparin and aspirin during admission.  LDL 80.  Atorvastatin increased to 40mg  this admission and Imdur 30 mg daily was started.  Aspirin was help upon initiation of Apixaban.  Acute on chronic systolic CHF/ICM: (EF [id number] per Carlton Adam 01/2014) --> now EF completely  normalized Weight increased by 2kg admission and he appears to be mildly volume overloaded. We will give a dose of Lasix 40 mg IV 1.since .  Continue home metoprolol.   Acute anemia: H/H improved after transfusion and was stable.  however, hemoglobin has dropped from 9.6-8.3 after receiving 2 doses of apixaban. We will repeat an H&H now prior to determining whether he will be able to tolerate this medication  source found on upper endoscopy.  FOBT + . Iron studies have come back in normal range, going against anemia being the result of chronic long-term GI  bld loss.  Apixaban was chosen given its favorable bleeding profile compared with warfarin.  Xarelto has lower risk of major bleed than warfarin but higher risk of GI bleed.  Stop heparin when apixaban is started.  PPI was started by GI.  If he cannot tolerate anticoagulation, may consider IVC filter.  CKD: creat had improved with diuresis 1.53--> 1.2.  SSS s/p PPM ([employer]) - Device interrogated in the ED. He is 97% A paced. No Ventricular arrhythmias.   HTN - BP C better controlled today.  Imdur was added to his admission.  If BP remains elevated can switch to carvedilol, as heart rate is in the 60s.  PE - As per above, switched to Apixaban.  Now are  with h/h dropping.  Will repeat h/h prior to determining if he will be able to receive long-term anticoagulation.  Prostate Cancer - We will continue his home Prednisone & Zytiga.   OSA on CPAP  Nachman Sundt C. Oval Linsey, MD, [facility] 08/28/2015 10:49 AM

## 2015-08-28 NOTE — Care Management Note (Signed)
Case Management Note  Patient Details  Name: Edward Woodward MRN: [id number] Date of Birth: 1927-03-06  Subjective/Objective:    Pt admitted with hematemesis                Action/Plan:  Pt is independent from home with wife.  Pt will discharge home on Eliquis   Expected Discharge Date:                  Expected Discharge Plan:  Home/Self Care  In-House Referral:     Discharge planning Services  CM Consult, Medication Assistance  Post Acute Care Choice:    Choice offered to:     DME Arranged:    DME Agency:     HH Arranged:    [place] Agency:     Status of Service:  Completed, signed off  Medicare Important Message Given:  Yes Date Medicare IM Given:    Medicare IM give by:    Date Additional Medicare IM Given:    Additional Medicare Important Message give by:     If discussed at [place] of Stay Meetings, dates discussed:    Additional Comments: CM submitted benefit check for pt on Eliquis, benefit check not recevied prior to discharge of pt, CM instructed pt to request copay and prior authorization information from pharmacy at initial prescription filling.  CM provided free 30 day card to pt/wife.  CM contacted preferred pharmacy [facility] on [address] and informed that pharmacy is able to fill prescription at discharge.  No other CM needs determined prior to discharge. Maryclare Labrador, RN 08/28/2015, 4:14 PM

## 2015-08-28 NOTE — Progress Notes (Signed)
Patient discharged this evening. Discharge instructions given and education given. All questions answered. Assessments remained unchanged prior to discharged.

## 2015-08-28 NOTE — Progress Notes (Signed)
Pt stated that he does not need help setting up his home CPAP. Family at bedside.

## 2015-08-28 NOTE — Discharge Summary (Signed)
Discharge Summary    Patient ID: Edward Woodward,  MRN: [id number], DOB/AGE: 1927/03/30 82 y.o.  Admit date: 08/23/2015 Discharge date: 08/28/2015  Primary Care Provider: Mathews Argyle Primary Cardiologist: Dr. Tamala Julian   Discharge Diagnoses    Active Problems:   Coronary atherosclerosis of native coronary artery   Essential hypertension, benign   OSA on CPAP   Cardiac pacemaker -[employer]   Pulmonary embolus [facility])   Chest pain   Chronic systolic heart failure (HCC)   Pain in the chest   NSTEMI (non-ST elevated myocardial infarction) (HCC)   Iron deficiency anemia due to chronic blood loss   Allergies Allergies  Allergen Reactions  . Penicillins Swelling  . Simvastatin Other (See Comments)    Muscle weakness in legs ?    Diagnostic Studies/Procedures    ENDOSCOPIC IMPRESSION: 1. Small hiatal hernia 2. Otherwise within normal limits to the third portion of the duodenum without signs of GI bleeding  Colonoscopy  1. Internal/external hemorrhoids 2. Few left-sided diverticuli 3. Multiple descending transverse ascending and cecal small polyps not biopsied 4. No blood seen on insertion and exam to the terminal ileum 5. Very small clot in mid descending at 40 cm washed off with minimal oozing which stopped on its own and was reevaluated on re-advancing one more time and appeared to be a nonsteroidal and aspirin-induced erosion and again not bleeding at the end of the procedure  [facility] 08/27/15 Procedures    Left Heart Cath and Cors/Grafts Angiography    Conclusion    1. Ost RPDA lesion, 100% stenosed. 2. Ost RCA to Dist RCA lesion, 70% stenosed. The lesion was previously treated with a stent (unknown type). 3. Ost 1st Mrg to 1st Mrg lesion, 100% stenosed. 4. Ost 2nd Mrg lesion, 95% stenosed. 5. 2nd Mrg lesion, 75% stenosed. 6. Lat 2nd Mrg lesion, 80% stenosed. 7. Ost LAD lesion, 100% stenosed. 8. SVG was injected is normal in caliber. 9. There is  moderate disease in the graft. 10. was injected is normal in caliber. 11. There is moderate disease in the graft. 12. Prox Graft to Mid Graft lesion, 40% stenosed. The lesion was previously treated with a stent (unknown type). 13. Prox Graft to Dist Graft lesion, before 1st Mrg, 40% stenosed. The lesion was previously treated with a stent (unknown type). 14. Dist LAD lesion, 70% stenosed.   Angiography is nearly impossible to perform from the femoral approach due to marked iliofemoral tortuosity which required use of 125 cm catheters. The tortuosity prevented catheter torque.  Widely patent sequential saphenous vein graft to the first and second obtuse marginal. Widely patent saphenous vein graft to the first diagonal.  Severe native vessel disease with high-grade diffuse significant stenoses throughout the native right some of which represents in-stent restenosis. Distal territory is meager. The PDA is totally occluded and supplied by left-to-right collaterals.  Left main coronary artery was never selectively engaged due to tortuosity as described above and lack of appropriate length catheters.  Native left coronary system has shown chronic total occlusion of the LAD in the past. The saphenous vein graft to the diagonal which supplies the LAD is widely patent.  The native circumflex territory contains high-grade obstruction proximal to the graft insertion site in the second obtuse marginal and thus threatens a large third obtuse marginal branch. There is antegrade significant disease beyond the graft insertion site in the 2nd obtuse marginal. The native circumflex ostial and proximal segment was stented greater than 7 years ago  but could not be selectively engaged (as mentioned above under left main).  RECOMMENDATIONS:   Source of angina could be the native right coronary, or the circumflex territory is setting of significant anemia. These territories by necessity will have to be treated  with medically as there are no interventional options.  He is not a candidate for triple therapy (dual antiplatelet + coumadin/NOAC) and perhaps not even double drug therapy (antiplatlet +coumadin/NOAC). I would resume Xarelto or coumadin when safe and hemoglobin is stable to complete PE therapy. If recurrent bleeding, may need to switch to single antiplatelet therapy.  Transfuse to keep Hgb > 9.5 as this will help control angina.      History of Present Illness     82 yo M pt of Dr. Daneen Schick w a h/o CAD s/p CABG (1995) s/p subsequent PCI's (BMS to SVG-Diag 2007, BMS to SVG-Diag 2008, DES to LCx 2011), SSS s/p PPM, HTN, HLD, CKD (baseline Cr 1.2-1.5), Non-Hodgkin's lymphoma (s/p Chemo 2008-2009), prostate cancer (on Abiraterone & Prednisone since 04/2015), PE (bilatreral diagnosed 03/2015, started Xarelto, stopped Clopidogrel), & OSA on CPAP p/w CP and EKG abnormalties.  Hospital Course    Patient was admitted to telemetry. Cardiac enzymes were cycled and showed upward trend at 0.03 --> 0.18 --> 0.27, however he also had worsening anemia after starting Xarelto with drop in Hgb from 11.5 --> 9.1 --> 8.7-->7.9.  FOBT was positive. It was felt that his troponin elevation was secondary to demand ischemia in the setting of known CAD and significant drop in Hgb in the last 3 months.  Xarelto was held and GI consulted. He was transfused 2 units of blood. He underwent an EGD and colonoscopy which were both unremarkable other than a hiatal hernia. His H/H remained stable. On 08/27/15, he underwent a LHC by Dr. Tamala Julian which showed complex coronary disease, not amendable to intervention (see angiographic details above). Dr. Tamala Julian recommended medical therapy + keeping his Hgb >9.5 to help control his angina. He was placed on metoprolol and Imdur for angina. Unfortunately, given his anemia requiring transfusion, DAPT is not an option currently. He did tolerate ASA and heparin during his admission. He was continued  on ASA and started on Eliquis. A PPI was also initiated The following morning, CBC showed a slight drop in Hgb from 9.6 to 8.3, however this was followed by repeat H/H. This showed an upward trend back to 9.5. He had no additional issues and appeared to be tolerating Eliquis ok. He was last seen and examined by Dr. Oval Linsey who determined he was stable for discharge home. He was given a Rx for a free 30 day supply for Eliquis. He will f/u in 1 week in clinic and will need a f/u CBC.     Consultants: GI   Discharge Vitals Blood pressure 133/54, pulse 73, temperature 97.6 F (36.4 C), temperature source Oral, resp. rate 18, height 5\' 10"  (1.778 m), weight 225 lb 8 oz (102.286 kg), SpO2 98 %.  Filed Weights   08/24/15 0015 08/27/15 0536 08/28/15 0442  Weight: 225 lb 5 oz (102.2 kg) 221 lb 6.4 oz (100.426 kg) 225 lb 8 oz (102.286 kg)    Labs & Radiologic Studies     CBC  Recent Labs  08/26/15 0444 08/27/15 0444 08/28/15 0330 08/28/15 1245  WBC 7.0 6.2 5.7  --   NEUTROABS 4.4  --   --   --   HGB 9.9* 9.6* 8.3* 9.5*  HCT 30.7* 29.5* 25.4* 29.1*  MCV 95.0 96.4 96.9  --   PLT 165 160 151  --    Basic Metabolic Panel  Recent Labs  08/27/15 0444 08/28/15 0330  NA 140 142  K 3.6 3.9  CL 107 111  CO2 27 24  GLUCOSE 93 103*  BUN 13 11  CREATININE 1.21 1.20  CALCIUM 8.2* 7.8*   Liver Function Tests No results for input(s): AST, ALT, ALKPHOS, BILITOT, PROT, ALBUMIN in the last 72 hours. No results for input(s): LIPASE, AMYLASE in the last 72 hours. Cardiac Enzymes  Recent Labs  08/26/15 1621  TROPONINI 0.07*   BNP Invalid input(s): POCBNP D-Dimer No results for input(s): DDIMER in the last 72 hours. Hemoglobin A1C No results for input(s): HGBA1C in the last 72 hours. Fasting Lipid Panel No results for input(s): CHOL, HDL, LDLCALC, TRIG, CHOLHDL, LDLDIRECT in the last 72 hours. Thyroid Function Tests No results for input(s): TSH, T4TOTAL, T3FREE, THYROIDAB in the  last 72 hours.  Invalid input(s): FREET3  Dg Chest Portable 1 View  08/23/2015  CLINICAL DATA:  Chest tightness, shortness of breath for 1 day EXAM: PORTABLE CHEST 1 VIEW COMPARISON:  03/31/2015 FINDINGS: Prior CABG. Left pacer in stable position. Heart and mediastinal contours are within normal limits. No focal opacities or effusions. No acute bony abnormality. IMPRESSION: No active cardiopulmonary disease. Electronically Signed   By: Rolm Baptise M.D.   On: 08/23/2015 17:57    Disposition   Pt is being discharged home today in good condition.  Follow-up Plans & Appointments    Follow-up Information    Follow up with Eileen Stanford, PA-C On 09/03/2015.   Specialties:  Cardiology, Radiology   Why:  2:00 pm (Dr. Thompson Caul PA)   Contact information:   [place] [address] [phone]      Discharge Instructions    Diet - low sodium heart healthy    Complete by:  As directed      Increase activity slowly    Complete by:  As directed            Discharge Medications   Current Discharge Medication List    START taking these medications   Details  !! apixaban (ELIQUIS) 5 MG TABS tablet Take 1 tablet (5 mg total) by mouth 2 (two) times daily. Qty: 60 tablet, Refills: 10    !! apixaban (ELIQUIS) 5 MG TABS tablet Take 1 tablet (5 mg total) by mouth 2 (two) times daily. Qty: 60 tablet, Refills: 0    folic acid (FOLVITE) 1 MG tablet Take 1 tablet (1 mg total) by mouth daily. Qty: 30 tablet, Refills: 5   Associated Diagnoses: Chronic systolic heart failure ([place]); NSTEMI (non-ST elevated myocardial infarction) ([place]); Iron deficiency anemia due to chronic blood loss; Other acute pulmonary embolism (HCC)    isosorbide mononitrate (IMDUR) 30 MG 24 hr tablet Take 1 tablet (30 mg total) by mouth daily. Qty: 30 tablet, Refills: 5    pantoprazole (PROTONIX) 40 MG tablet Take 1 tablet (40 mg total) by mouth 2 (two) times daily before a meal. Qty: 60  tablet, Refills: 5     !! - Potential duplicate medications found. Please discuss with provider.    CONTINUE these medications which have CHANGED   Details  atorvastatin (LIPITOR) 40 MG tablet Take 1 tablet (40 mg total) by mouth every other day. Qty: 30 tablet, Refills: 5    furosemide (LASIX) 20 MG tablet Take 1 tablet (20 mg total) by mouth  daily. Take one to two (1-2) tablets (40 mg to 80 mg total) by mouth daily as needed for edema. Qty: 30 tablet, Refills: 5    nitroGLYCERIN (NITROLINGUAL) 0.4 MG/SPRAY spray Place 1 spray under the tongue every 5 (five) minutes x 3 doses as needed for chest pain. Qty: 12 g, Refills: 3   Associated Diagnoses: Atherosclerosis of native coronary artery of native heart without angina pectoris      CONTINUE these medications which have NOT CHANGED   Details  abiraterone Acetate (ZYTIGA) 250 MG tablet Take 4 tablets (1,000 mg total) by mouth daily. Take on an empty stomach 1 hour before or 2 hours after a meal Qty: 120 tablet, Refills: 3   Associated Diagnoses: Prostate cancer ([employer])    b complex vitamins tablet Take 1 tablet by mouth daily.     Associated Diagnoses: Other malignant lymphomas, unspecified site, extranodal and solid organ sites; Anemia, unspecified    Cholecalciferol (VITAMIN D3) 2000 UNITS TABS Take 2 tablets by mouth daily.    Associated Diagnoses: Other malignant lymphomas, unspecified site, extranodal and solid organ sites    citalopram (CELEXA) 20 MG tablet Take 20 mg by mouth daily.     Associated Diagnoses: Other malignant lymphomas, unspecified site, extranodal and solid organ sites; Anemia, unspecified    Cyanocobalamin (VITAMIN B-12 CR) 1000 MCG TBCR Take 1 tablet by mouth daily.    gabapentin (NEURONTIN) 300 MG capsule Take 300 mg by mouth 2 (two) times daily. 2 times daily w/ additional dose PRN, not to exceed TID   Associated Diagnoses: Other malignant lymphomas, unspecified site, extranodal and solid organ sites;  Anemia, unspecified    Melatonin 3 MG TABS Take 3 mg by mouth at bedtime.    metoprolol succinate (TOPROL-XL) 25 MG 24 hr tablet TAKE ONE TABLET BY MOUTH ONCE DAILY Qty: 30 tablet, Refills: 2    Multiple Vitamin (MULTIVITAMIN) capsule Take 1 capsule by mouth daily.   Associated Diagnoses: Other malignant lymphomas, unspecified site, extranodal and solid organ sites    Polyethyl Glycol-Propyl Glycol (SYSTANE) 0.4-0.3 % SOLN Apply 1 drop to eye at bedtime.    predniSONE (DELTASONE) 5 MG tablet Take 1 tablet (5 mg total) by mouth 2 (two) times daily. Qty: 60 tablet, Refills: 3    UBIQUINOL PO Take 1 tablet by mouth daily. CoQ10      STOP taking these medications     XARELTO 20 MG TABS tablet           Outstanding Labs/Studies   F/u CBC at time of post hospital f/u 09/03/15  Duration of Discharge Encounter   Greater than 30 minutes including physician time.  Alveta Heimlich, Leonid Manus NP 08/28/2015, 3:38 PM

## 2015-08-28 NOTE — Discharge Instructions (Signed)
Information on my medicine - ELIQUIS (apixaban)  This medication education was reviewed with me or my healthcare representative as part of my discharge preparation.  The pharmacist that spoke with me during my hospital stay was:  Wayland Salinas, [facility]  Why was Eliquis prescribed for you? Eliquis was prescribed to treat blood clots that may have been found in the veins of your legs (deep vein thrombosis) or in your lungs (pulmonary embolism) and to reduce the risk of them occurring again.  What do You need to know about Eliquis ? The dose is 5 mg tablet taken TWICE daily.  Eliquis may be taken with or without food.   Try to take the dose about the same time in the morning and in the evening. If you have difficulty swallowing the tablet whole please discuss with your pharmacist how to take the medication safely.  Take Eliquis exactly as prescribed and DO NOT stop taking Eliquis without talking to the doctor who prescribed the medication.  Stopping may increase your risk of developing a new blood clot.  Refill your prescription before you run out.  After discharge, you should have regular check-up appointments with your healthcare provider that is prescribing your Eliquis.    What do you do if you miss a dose? If a dose of ELIQUIS is not taken at the scheduled time, take it as soon as possible on the same day and twice-daily administration should be resumed. The dose should not be doubled to make up for a missed dose.  Important Safety Information A possible side effect of Eliquis is bleeding. You should call your healthcare provider right away if you experience any of the following: ? Bleeding from an injury or your nose that does not stop. ? Unusual colored urine (red or dark brown) or unusual colored stools (red or black). ? Unusual bruising for unknown reasons. ? A serious fall or if you hit your head (even if there is no bleeding).  Some medicines may interact with  Eliquis and might increase your risk of bleeding or clotting while on Eliquis. To help avoid this, consult your healthcare provider or pharmacist prior to using any new prescription or non-prescription medications, including herbals, vitamins, non-steroidal anti-inflammatory drugs (NSAIDs) and supplements.  This website has more information on Eliquis (apixaban): http://www.eliquis.com/eliquis/home

## 2015-08-28 NOTE — Progress Notes (Signed)
@  1345pm urine spec sent to lab for urinalysis & culture  Mervyn Skeeters, RN

## 2015-08-28 NOTE — Progress Notes (Signed)
@  1400pm  Pt up to [facility]  Moderate amt soft  Brown stool. No blood noted in stool.  Family requested  For Dr. Oval Linsey to be called, questioning the need for a stool specimen.  Dr. Oval Linsey was notified and no spec needed at this time.  Mervyn Skeeters, RN

## 2015-08-29 ENCOUNTER — Other Ambulatory Visit: Payer: Self-pay | Admitting: *Deleted

## 2015-08-29 LAB — URINE CULTURE

## 2015-08-30 ENCOUNTER — Telehealth: Payer: Self-pay | Admitting: *Deleted

## 2015-08-30 DIAGNOSIS — Z86718 Personal history of other venous thrombosis and embolism: Secondary | ICD-10-CM

## 2015-08-30 MED ORDER — WARFARIN SODIUM 5 MG PO TABS: 5.0000 mg | ORAL_TABLET | Freq: Every day | ORAL | Status: DC

## 2015-08-30 NOTE — Telephone Encounter (Signed)
Returned call to pt's wife. Instructions given to begin Coumadin 5 mg on 08/31/15. STOP Eliquis. Pt has not taken it today. Coumadin Rx sent electronically. Will check INR 2/7.

## 2015-08-30 NOTE — Telephone Encounter (Signed)
Spoke with pt's wife: Dr. Benay Spice and Dr. Tamala Julian discussed case. Discontinue Eliquis and plan to start Coumadin. Goal INR 1.5-2. Coumadin can be managed here or at cardiology's anticoag clinic. Wife prefers Dr. Benay Spice manage Coumadin. Pt is scheduled for office visit 2/7.

## 2015-09-01 NOTE — Progress Notes (Signed)
This encounter was created in error - please disregard.

## 2015-09-03 ENCOUNTER — Encounter: Payer: Medicare Other | Admitting: Physician Assistant

## 2015-09-03 ENCOUNTER — Telehealth: Payer: Self-pay | Admitting: Oncology

## 2015-09-03 ENCOUNTER — Ambulatory Visit: Payer: Medicare Other

## 2015-09-03 ENCOUNTER — Other Ambulatory Visit: Payer: Self-pay | Admitting: *Deleted

## 2015-09-03 ENCOUNTER — Other Ambulatory Visit (HOSPITAL_BASED_OUTPATIENT_CLINIC_OR_DEPARTMENT_OTHER): Payer: Medicare Other

## 2015-09-03 ENCOUNTER — Telehealth: Payer: Self-pay | Admitting: Pharmacist

## 2015-09-03 ENCOUNTER — Encounter: Payer: Self-pay | Admitting: Pharmacist

## 2015-09-03 ENCOUNTER — Ambulatory Visit (HOSPITAL_BASED_OUTPATIENT_CLINIC_OR_DEPARTMENT_OTHER): Payer: Medicare Other

## 2015-09-03 ENCOUNTER — Ambulatory Visit (HOSPITAL_BASED_OUTPATIENT_CLINIC_OR_DEPARTMENT_OTHER): Payer: Medicare Other | Admitting: Oncology

## 2015-09-03 ENCOUNTER — Ambulatory Visit (HOSPITAL_COMMUNITY)
Admission: RE | Admit: 2015-09-03 | Discharge: 2015-09-03 | Disposition: A | Discharge status: Home / Self Care | Payer: Medicare Other | Source: Ambulatory Visit | Attending: Oncology | Admitting: Oncology

## 2015-09-03 VITALS — BP 119/44 | HR 65 | Temp 97.5°F | Resp 18 | Ht 70.0 in | Wt 226.3 lb

## 2015-09-03 VITALS — BP 138/72 | HR 73 | Temp 97.9°F | Resp 18

## 2015-09-03 DIAGNOSIS — Z86718 Personal history of other venous thrombosis and embolism: Secondary | ICD-10-CM

## 2015-09-03 DIAGNOSIS — D500 Iron deficiency anemia secondary to blood loss (chronic): Secondary | ICD-10-CM

## 2015-09-03 DIAGNOSIS — I482 Chronic atrial fibrillation, unspecified: Secondary | ICD-10-CM

## 2015-09-03 DIAGNOSIS — K922 Gastrointestinal hemorrhage, unspecified: Secondary | ICD-10-CM

## 2015-09-03 DIAGNOSIS — D649 Anemia, unspecified: Secondary | ICD-10-CM

## 2015-09-03 DIAGNOSIS — I82402 Acute embolism and thrombosis of unspecified deep veins of left lower extremity: Secondary | ICD-10-CM | POA: Diagnosis not present

## 2015-09-03 DIAGNOSIS — I2782 Chronic pulmonary embolism: Secondary | ICD-10-CM

## 2015-09-03 DIAGNOSIS — Z7901 Long term (current) use of anticoagulants: Secondary | ICD-10-CM | POA: Diagnosis not present

## 2015-09-03 DIAGNOSIS — C61 Malignant neoplasm of prostate: Secondary | ICD-10-CM | POA: Diagnosis not present

## 2015-09-03 DIAGNOSIS — Z8572 Personal history of non-Hodgkin lymphomas: Secondary | ICD-10-CM

## 2015-09-03 LAB — PROTIME-INR
INR: 1.1 — AB (ref 2.00–3.50)
Protime: 13.2 Seconds (ref 10.6–13.4)

## 2015-09-03 LAB — COMPREHENSIVE METABOLIC PANEL
ALBUMIN: 3.3 g/dL — AB (ref 3.5–5.0)
ALK PHOS: 57 U/L (ref 40–150)
ALT: 14 U/L (ref 0–55)
AST: 17 U/L (ref 5–34)
Anion Gap: 12 mEq/L — ABNORMAL HIGH (ref 3–11)
BUN: 26.8 mg/dL — AB (ref 7.0–26.0)
CALCIUM: 9.3 mg/dL (ref 8.4–10.4)
CO2: 24 mEq/L (ref 22–29)
CREATININE: 1.6 mg/dL — AB (ref 0.7–1.3)
Chloride: 103 mEq/L (ref 98–109)
EGFR: 39 mL/min/{1.73_m2} — ABNORMAL LOW (ref >90)
Glucose: 124 mg/dl (ref 70–140)
Potassium: 4 mEq/L (ref 3.5–5.1)
Sodium: 139 mEq/L (ref 136–145)
TOTAL PROTEIN: 6.3 g/dL — AB (ref 6.4–8.3)
Total Bilirubin: 1.52 mg/dL — ABNORMAL HIGH (ref 0.20–1.20)

## 2015-09-03 LAB — CBC WITH DIFFERENTIAL/PLATELET
BASO%: 0.3 % (ref 0.0–2.0)
Basophils Absolute: 0 10*3/uL (ref 0.0–0.1)
EOS%: 2.2 % (ref 0.0–7.0)
Eosinophils Absolute: 0.2 10*3/uL (ref 0.0–0.5)
HEMATOCRIT: 27.6 % — AB (ref 38.4–49.9)
HEMOGLOBIN: 8.7 g/dL — AB (ref 13.0–17.1)
LYMPH#: 1.7 10*3/uL (ref 0.9–3.3)
LYMPH%: 24.3 % (ref 14.0–49.0)
MCH: 31.8 pg (ref 27.2–33.4)
MCHC: 31.5 g/dL — ABNORMAL LOW (ref 32.0–36.0)
MCV: 100.7 fL — ABNORMAL HIGH (ref 79.3–98.0)
MONO#: 0.6 10*3/uL (ref 0.1–0.9)
MONO%: 8.6 % (ref 0.0–14.0)
NEUT#: 4.6 10*3/uL (ref 1.5–6.5)
NEUT%: 64.6 % (ref 39.0–75.0)
Platelets: 194 10*3/uL (ref 140–400)
RBC: 2.74 10*6/uL — ABNORMAL LOW (ref 4.20–5.82)
RDW: 18.4 % — AB (ref 11.0–14.6)
WBC: 7.2 10*3/uL (ref 4.0–10.3)

## 2015-09-03 LAB — PREPARE RBC (CROSSMATCH)

## 2015-09-03 LAB — ABO/RH: ABO/RH(D): A POS

## 2015-09-03 MED ORDER — SODIUM CHLORIDE 0.9 % IV SOLN
250.0000 mL | Freq: Once | INTRAVENOUS | Status: AC
  Administered 2015-09-03: 250 mL via INTRAVENOUS

## 2015-09-03 NOTE — Telephone Encounter (Signed)
Pt confirmed labs/ov per 02/07 POF, gave pt AVS and Calendar... KJ °

## 2015-09-03 NOTE — Patient Instructions (Signed)

## 2015-09-03 NOTE — Progress Notes (Signed)
[facility] OFFICE PROGRESS NOTE   Diagnosis: Prostate cancer, non-Hodgkin's lymphoma  INTERVAL HISTORY:   Edward Woodward returns as scheduled. He is admitted with a GI bleed 08/23/2015. No source for bleeding was identified on upper and lower endoscopy 08/26/2015. Xarelto was discontinued. He is now maintained on Coumadin anticoagulation. He continues prednisone and Abiraterone for prostate cancer.  He began Coumadin 08/31/2015. He has not noted bleeding since discharge from the hospital. He feels "weak ". No chest pain. He has exertional dyspnea.  Objective:  Vital signs in last 24 hours:  Blood pressure 119/44, pulse 65, temperature 97.5 F (36.4 C), resp. rate 18, height 5\' 10"  (1.778 m), weight 226 lb 4.8 oz (102.649 kg), SpO2 100 %.    Resp: Distant breath sounds, rhonchi at the posterior bases, no respiratory distress Cardio: Regular rate and rhythm GI: No hepatosplenomegaly, nontender Vascular: Trace pitting edema at the lower leg bilaterally  Lab Results:  Lab Results  Component Value Date   WBC 7.2 09/03/2015   HGB 8.7* 09/03/2015   HCT 27.6* 09/03/2015   MCV 100.7* 09/03/2015   PLT 194 09/03/2015   NEUTROABS 4.6 09/03/2015   PT/INR-1.1  BUN 26.8, creatinine 1.6   Medications: I have reviewed the patient's current medications.  Assessment/Plan: 1. Non-Hodgkin's lymphoma (follicular grade 3 lymphoma) - status post 6 cycles of Cytoxan/prednisone/rituximab 07/01/2007 through 10/21/2007. He remains in clinical remission. 2. CT chest 04/01/2015 with no lymphadenopathy  CT abdomen/pelvis 05/01/2015 with mild progression of abdomen/pelvic lymphadenopathy 3. Prostate cancer - he has been treated with hormonal therapy, the PSA was stable on 10/15/2014  PSA increased 04/26/2015   Bone scan 05/09/2015 with unchanged increased activity in the thoracic and lumbar spine felt to most likely be degenerative.   Initiation of Abiraterone and prednisone  05/11/2015. Normalization of the PSA on Abiraterone. 4. History of a normocytic anemia secondary to non-Hodgkin's lymphoma, chemotherapy, and renal insufficiency - progressive secondary to GI bleeding 5. History of Mild thrombocytopenia  6. History of bilateral hydronephrosis. Renal ultrasound 09/26/2014 consistent with medical renal disease. No hydronephrosis. 7. Coronary artery disease - status post coronary artery stent placement. 8. History of noncalcified lung nodules. 9. History of severe neutropenia July 2009 - likely related to delayed toxicity from rituximab. 10. Macular degeneration. 11. Right middle lobe density on a CT of the chest 06/18/2010 measuring 2.5 x 1.6 cm with mildly increased metabolic activity (SUV max 2.5) on a PET scan 06/27/2010. The area of increased density was less confluent and appeared smaller on a restaging CT 09/08/2010. Right middle lobe "scarring" with no new or suspicious pulmonary nodule on a CT 08/04/2011. 12. Mild increased metabolic activity associated with several lymph nodes on the PET scan 06/27/2010 - likely related to lymphoma. No lymphadenopathy in the mediastinum or axillary areas on the CT 08/04/2011. 13. Right hip discomfort-he received a steroid injection by his primary physician without improvement. He saw Dr. Collier Salina and there was a question of a lytic process in the right pelvis. A bone scan on 09/21/2012 revealed no evidence of metastatic disease. 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 Renal insufficiency 15. Left leg DVT and pulmonary embolism 04/01/2015-Xarelto post Zometa 04/26/2015, improved 16. Admission 08/23/2015 with severe anemia and GI bleeding  Upper and lower endoscopy 08/26/2015 without a clear source for bleeding identified    Disposition:   Edward Woodward continues abiraterone and prednisone for treatment of prostate cancer. We will follow-up on the PSA from today. He now  has severe anemia,  likely secondary to recent GI bleeding. He complains of generalized weakness and exertional dyspnea. We will arrange for a red cell transfusion today.  He will return for a lab visit on 09/06/2015. Edward Woodward will be scheduled for an office visit 09/10/2015.  [name], MD  09/03/2015  12:02 PM

## 2015-09-03 NOTE — Progress Notes (Signed)
PIV left in placed per Pt request. Pt to return to clinic 09/04/15 at 2:15pm for infusion. Pt educated to keep blue bracelet intact, pt verbalizes understanding.

## 2015-09-03 NOTE — Telephone Encounter (Signed)
error 

## 2015-09-03 NOTE — Progress Notes (Signed)
Oral Chemotherapy Follow-Up Form  Original Start date of oral chemotherapy: _10/15/16___   Called patient today to follow up regarding patient's oral chemotherapy medication: _Zytiga  Pt is doing ok today. He is doing great on Zytiga with no issues. PSA has been down. But he recently was hospitalized with possible GI bleed. Hgb today is only 8.7. He is fatigued from this. Xarelto has been D/C'd and he just was started on coumadin. He was continued on Zytiga in the hospital. No missed doses to report.  Pt reports __0__ tablets/doses missed in the last month.   Pt reports the following side effects: __fatigue from low Hgb (not Zytiga related)___  Other Issues: _recent GI bleed now on coumadin (Xarelto Stopped)___   Will follow up and call patient again in __4 weeks___   Thank you,  Montel Clock, PharmD, [facility]

## 2015-09-04 ENCOUNTER — Ambulatory Visit: Payer: Medicare Other

## 2015-09-04 ENCOUNTER — Other Ambulatory Visit: Payer: Self-pay | Admitting: *Deleted

## 2015-09-04 VITALS — BP 176/71 | HR 67 | Temp 97.6°F | Resp 18

## 2015-09-04 DIAGNOSIS — D649 Anemia, unspecified: Secondary | ICD-10-CM | POA: Diagnosis not present

## 2015-09-04 LAB — PSA (PARALLEL TESTING): PSA: 1.07 ng/mL (ref <=4.00)

## 2015-09-04 LAB — PSA: Prostate Specific Ag, Serum: 1.1 ng/mL (ref 0.0–4.0)

## 2015-09-04 NOTE — Patient Instructions (Signed)

## 2015-09-05 ENCOUNTER — Telehealth: Payer: Self-pay | Admitting: Oncology

## 2015-09-05 ENCOUNTER — Other Ambulatory Visit: Payer: Self-pay | Admitting: *Deleted

## 2015-09-05 LAB — TYPE AND SCREEN
ABO/RH(D): A POS
Antibody Screen: NEGATIVE
UNIT DIVISION: 0
Unit division: 0

## 2015-09-05 NOTE — Telephone Encounter (Signed)
per pof to sch pt appt-cld & spoke to pt and gave pt time & date of appt °

## 2015-09-06 ENCOUNTER — Other Ambulatory Visit (HOSPITAL_BASED_OUTPATIENT_CLINIC_OR_DEPARTMENT_OTHER): Payer: Medicare Other

## 2015-09-06 ENCOUNTER — Telehealth: Payer: Self-pay | Admitting: *Deleted

## 2015-09-06 DIAGNOSIS — D500 Iron deficiency anemia secondary to blood loss (chronic): Secondary | ICD-10-CM

## 2015-09-06 DIAGNOSIS — I2782 Chronic pulmonary embolism: Secondary | ICD-10-CM

## 2015-09-06 DIAGNOSIS — I82402 Acute embolism and thrombosis of unspecified deep veins of left lower extremity: Secondary | ICD-10-CM

## 2015-09-06 DIAGNOSIS — I482 Chronic atrial fibrillation, unspecified: Secondary | ICD-10-CM

## 2015-09-06 DIAGNOSIS — D649 Anemia, unspecified: Secondary | ICD-10-CM

## 2015-09-06 LAB — CBC WITH DIFFERENTIAL/PLATELET
BASO%: 0.4 % (ref 0.0–2.0)
Basophils Absolute: 0 10*3/uL (ref 0.0–0.1)
EOS ABS: 0.1 10*3/uL (ref 0.0–0.5)
EOS%: 1.8 % (ref 0.0–7.0)
HCT: 30.7 % — ABNORMAL LOW (ref 38.4–49.9)
HGB: 9.9 g/dL — ABNORMAL LOW (ref 13.0–17.1)
LYMPH%: 22.3 % (ref 14.0–49.0)
MCH: 30.7 pg (ref 27.2–33.4)
MCHC: 32.2 g/dL (ref 32.0–36.0)
MCV: 95.5 fL (ref 79.3–98.0)
MONO#: 0.7 10*3/uL (ref 0.1–0.9)
MONO%: 8.7 % (ref 0.0–14.0)
NEUT%: 66.8 % (ref 39.0–75.0)
NEUTROS ABS: 5 10*3/uL (ref 1.5–6.5)
Platelets: 182 10*3/uL (ref 140–400)
RBC: 3.21 10*6/uL — AB (ref 4.20–5.82)
RDW: 20.1 % — AB (ref 11.0–14.6)
WBC: 7.5 10*3/uL (ref 4.0–10.3)
lymph#: 1.7 10*3/uL (ref 0.9–3.3)

## 2015-09-06 LAB — PROTIME-INR
INR: 1.9 — AB (ref 2.00–3.50)
PROTIME: 22.8 s — AB (ref 10.6–13.4)

## 2015-09-06 LAB — TECHNOLOGIST REVIEW

## 2015-09-06 MED FILL — ZYTIGA 250 MG TABLET: 250 | 30 days supply | Qty: 120 | Fill #3

## 2015-09-06 NOTE — Telephone Encounter (Signed)
Per Dr. Benay Spice; left voice message on home and wife voicemail that pt hb better, stay on same dose of coumadin and we'll recheck lab prior to office visit 2/14.  Call office if any questions.

## 2015-09-06 NOTE — Telephone Encounter (Signed)
-----   Message from Ladell Pier, MD sent at 09/06/2015  2:53 PM EST ----- Please call patient, hb  Is better, same coumadin, check PT 2/14, should have f/u appt. scheduled

## 2015-09-10 ENCOUNTER — Ambulatory Visit (HOSPITAL_BASED_OUTPATIENT_CLINIC_OR_DEPARTMENT_OTHER): Payer: Medicare Other | Admitting: Nurse Practitioner

## 2015-09-10 ENCOUNTER — Telehealth: Payer: Self-pay | Admitting: Nurse Practitioner

## 2015-09-10 ENCOUNTER — Ambulatory Visit: Payer: Medicare Other | Admitting: Nurse Practitioner

## 2015-09-10 ENCOUNTER — Ambulatory Visit (HOSPITAL_BASED_OUTPATIENT_CLINIC_OR_DEPARTMENT_OTHER): Payer: Medicare Other

## 2015-09-10 VITALS — BP 140/51 | HR 65 | Resp 17 | Ht 70.0 in | Wt 225.1 lb

## 2015-09-10 DIAGNOSIS — D649 Anemia, unspecified: Secondary | ICD-10-CM

## 2015-09-10 DIAGNOSIS — N289 Disorder of kidney and ureter, unspecified: Secondary | ICD-10-CM

## 2015-09-10 DIAGNOSIS — C61 Malignant neoplasm of prostate: Secondary | ICD-10-CM

## 2015-09-10 DIAGNOSIS — I82402 Acute embolism and thrombosis of unspecified deep veins of left lower extremity: Secondary | ICD-10-CM

## 2015-09-10 DIAGNOSIS — I482 Chronic atrial fibrillation, unspecified: Secondary | ICD-10-CM

## 2015-09-10 DIAGNOSIS — I2699 Other pulmonary embolism without acute cor pulmonale: Secondary | ICD-10-CM

## 2015-09-10 DIAGNOSIS — Z8572 Personal history of non-Hodgkin lymphomas: Secondary | ICD-10-CM | POA: Diagnosis not present

## 2015-09-10 DIAGNOSIS — R600 Localized edema: Secondary | ICD-10-CM

## 2015-09-10 LAB — COMPREHENSIVE METABOLIC PANEL
ALT: 13 U/L (ref 0–55)
AST: 17 U/L (ref 5–34)
Albumin: 3.5 g/dL (ref 3.5–5.0)
Alkaline Phosphatase: 58 U/L (ref 40–150)
Anion Gap: 8 mEq/L (ref 3–11)
BILIRUBIN TOTAL: 0.87 mg/dL (ref 0.20–1.20)
BUN: 24.7 mg/dL (ref 7.0–26.0)
CO2: 29 meq/L (ref 22–29)
Calcium: 9.5 mg/dL (ref 8.4–10.4)
Chloride: 106 mEq/L (ref 98–109)
Creatinine: 1.6 mg/dL — ABNORMAL HIGH (ref 0.7–1.3)
EGFR: 37 mL/min/{1.73_m2} — AB (ref >90)
GLUCOSE: 118 mg/dL (ref 70–140)
POTASSIUM: 4 meq/L (ref 3.5–5.1)
SODIUM: 143 meq/L (ref 136–145)
TOTAL PROTEIN: 6.6 g/dL (ref 6.4–8.3)

## 2015-09-10 LAB — PROTIME-INR
INR: 1.9 — AB (ref 2.00–3.50)
Protime: 22.8 Seconds — ABNORMAL HIGH (ref 10.6–13.4)

## 2015-09-10 LAB — CBC WITH DIFFERENTIAL/PLATELET
BASO%: 0.4 % (ref 0.0–2.0)
BASOS ABS: 0 10*3/uL (ref 0.0–0.1)
EOS ABS: 0.2 10*3/uL (ref 0.0–0.5)
EOS%: 2.1 % (ref 0.0–7.0)
HEMATOCRIT: 31.1 % — AB (ref 38.4–49.9)
HEMOGLOBIN: 10.2 g/dL — AB (ref 13.0–17.1)
LYMPH#: 2.3 10*3/uL (ref 0.9–3.3)
LYMPH%: 26.1 % (ref 14.0–49.0)
MCH: 31.7 pg (ref 27.2–33.4)
MCHC: 32.8 g/dL (ref 32.0–36.0)
MCV: 96.4 fL (ref 79.3–98.0)
MONO#: 0.6 10*3/uL (ref 0.1–0.9)
MONO%: 7 % (ref 0.0–14.0)
NEUT%: 64.4 % (ref 39.0–75.0)
NEUTROS ABS: 5.7 10*3/uL (ref 1.5–6.5)
Platelets: 186 10*3/uL (ref 140–400)
RBC: 3.22 10*6/uL — ABNORMAL LOW (ref 4.20–5.82)
RDW: 19.9 % — AB (ref 11.0–14.6)
WBC: 8.9 10*3/uL (ref 4.0–10.3)

## 2015-09-10 NOTE — Progress Notes (Addendum)
[facility] OFFICE PROGRESS NOTE   Diagnosis:  Prostate cancer, non-Hodgkin's lymphoma  INTERVAL HISTORY:   Edward Woodward returns as scheduled. He feels tired. No bleeding. He has stable exertional dyspnea. He has bilateral leg swelling. The left leg tends to be more swollen than the right leg. He notes the left leg is "weeping".  Objective:  Vital signs in last 24 hours:  Blood pressure 140/51, pulse 65, resp. rate 17, height 5\' 10"  (1.778 m), weight 225 lb 1.6 oz (102.105 kg), SpO2 99 %.    HEENT: No thrush or ulcers. Resp: Lungs clear bilaterally. Breath sounds are distant. Cardio: Regular rate and rhythm. GI: Soft and nontender. No organomegaly. Vascular: 2+ pitting edema at the lower legs bilaterally. Left medial lower leg with a small abrasion and drainage. No erythema. Skin: Ecchymosis dorsum of right hand.    Lab Results:  Lab Results  Component Value Date   WBC 8.9 09/10/2015   HGB 10.2* 09/10/2015   HCT 31.1* 09/10/2015   MCV 96.4 09/10/2015   PLT 186 09/10/2015   NEUTROABS 5.7 09/10/2015    Imaging:  No results found.  Medications: I have reviewed the patient's current medications.  Assessment/Plan: 1. Non-Hodgkin's lymphoma (follicular grade 3 lymphoma) - status post 6 cycles of Cytoxan/prednisone/rituximab 07/01/2007 through 10/21/2007. He remains in clinical remission. 2. CT chest 04/01/2015 with no lymphadenopathy  CT abdomen/pelvis 05/01/2015 with mild progression of abdomen/pelvic lymphadenopathy 3. Prostate cancer - he has been treated with hormonal therapy, the PSA was stable on 10/15/2014  PSA increased 04/26/2015   Bone scan 05/09/2015 with unchanged increased activity in the thoracic and lumbar spine felt to most likely be degenerative.   Initiation of Abiraterone and prednisone 05/11/2015. Normalization of the PSA on Abiraterone. 4. History of a normocytic anemia secondary to non-Hodgkin's lymphoma, chemotherapy, and renal  insufficiency - progressive secondary to GI bleeding 5. History of Mild thrombocytopenia  6. History of bilateral hydronephrosis. Renal ultrasound 09/26/2014 consistent with medical renal disease. No hydronephrosis. 7. Coronary artery disease - status post coronary artery stent placement. 8. History of noncalcified lung nodules. 9. History of severe neutropenia July 2009 - likely related to delayed toxicity from rituximab. 10. Macular degeneration. 11. Right middle lobe density on a CT of the chest 06/18/2010 measuring 2.5 x 1.6 cm with mildly increased metabolic activity (SUV max 2.5) on a PET scan 06/27/2010. The area of increased density was less confluent and appeared smaller on a restaging CT 09/08/2010. Right middle lobe "scarring" with no new or suspicious pulmonary nodule on a CT 08/04/2011. 12. Mild increased metabolic activity associated with several lymph nodes on the PET scan 06/27/2010 - likely related to lymphoma. No lymphadenopathy in the mediastinum or axillary areas on the CT 08/04/2011. 13. Right hip discomfort-he received a steroid injection by his primary physician without improvement. He saw Dr. Collier Salina and there was a question of a lytic process in the right pelvis. A bone scan on 09/21/2012 revealed no evidence of metastatic disease. 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 Renal insufficiency 15. Left leg DVT and pulmonary embolism 04/01/2015-on Coumadin 16. Admission 08/23/2015 with severe anemia and GI bleeding  Upper and lower endoscopy 08/26/2015 without a clear source for bleeding identified   Disposition: Edward Woodward appears unchanged. The most recent PSA was further improved in the normal range. He will continue Abiraterone.  The INR today is 1.9. This is within his goal range. He will continue Coumadin at the current dose  and return for repeat PT/INR 09/19/2015.  He will follow up with cardiology regarding the leg edema.  He  will return for a follow-up visit here in approximately one month.  Patient seen with Dr. Benay Spice.   [name] ANP/GNP-BC   09/10/2015  1:47 PM  This was a shared visit with [name]. The leg edema is likely related to renal failure and cardiac disease. The PSA continues to improve while on abiraterone/prednisone. The prednisone may also be contributing to the edema. We recommended he elevate his legs.  He will return for a lab visit in approximately 10 days and an office visit in one month.  [name], M.D.

## 2015-09-10 NOTE — Telephone Encounter (Signed)
Pt confirmed labs/ov per 02/14 POF, gave pt AVS and Calendar... KJ °

## 2015-09-13 DIAGNOSIS — I129 Hypertensive chronic kidney disease with stage 1 through stage 4 chronic kidney disease, or unspecified chronic kidney disease: Secondary | ICD-10-CM | POA: Diagnosis not present

## 2015-09-13 DIAGNOSIS — C8590 Non-Hodgkin lymphoma, unspecified, unspecified site: Secondary | ICD-10-CM | POA: Diagnosis not present

## 2015-09-13 DIAGNOSIS — N183 Chronic kidney disease, stage 3 (moderate): Secondary | ICD-10-CM | POA: Diagnosis not present

## 2015-09-13 DIAGNOSIS — D692 Other nonthrombocytopenic purpura: Secondary | ICD-10-CM | POA: Diagnosis not present

## 2015-09-17 ENCOUNTER — Other Ambulatory Visit: Payer: Self-pay | Admitting: Oncology

## 2015-09-17 NOTE — Progress Notes (Signed)
Cardiology Office Note:    Date:  09/18/2015   ID:  Edward Woodward, DOB [birth year], MRN [id number]  PCP:  Mathews Argyle, MD  Cardiologist:  Dr. Daneen Schick   Electrophysiologist:  Dr. Virl Axe   Chief Complaint  Patient presents with  . Hospitalization Follow-up    NSTEMI in the setting of profound anemia    History of Present Illness:     Edward Woodward is a 82 y.o. male with a hx of CAD s/p CABG (1995) s/p subsequent PCI's (BMS to SVG-Diag 2007, BMS to SVG-Diag 2008, DES to LCx 2011), SSS s/p PPM, HTN, HLD, CKD (baseline Cr 1.2-1.5), Non-Hodgkin's lymphoma (s/p Chemo 2008-2009), prostate cancer (on Abiraterone & Prednisone since 04/2015), PE (bilatreral diagnosed 03/2015, started Xarelto, stopped Clopidogrel), & OSA on CPAP.    Admitted 1/27-2/1 with a non-STEMI in the setting of worsening anemia. He presented with chest pain and EKG abnormalities. Troponin increased to 0.27. Hemoglobin was noted to be as low as 7.9 with positive FOBT. He required transfusion with 2 units PRBCs. EGD and colonoscopy were unremarkable. LHC demonstrated complex CAD not amenable to intervention. SVG-OM1/OM2 and SVG-DX were both patent. There was high-grade diffuse RCA stenosis including in-stent restenosis, an occluded PDA with left to right collaterals and high-grade obstruction in OM2 proximal to the SVG insertion. There were no good targets for PCI. Beta blocker and nitrates were added. Due to his blood loss anemia, he could not tolerate dual antiplatelet therapy. He was discharged on aspirin and Eliquis, PPI.  Recommendation is to keep Hgb > 9.5.  Hgb since discharge 8.7 (received transfusion with PRBCs 1 on 09/04/15) >> 9.9 >> 10.2.  Patient is followed by Dr. Benay Spice with oncology. His Eliquis has been changed to Coumadin which will be followed in the heme/onc clinic.   Returns for follow-up. Here with his wife. He denies chest discomfort. He has chronic dyspnea without significant change.  He has dyspnea with minimal activities. Denies orthopnea or PND. He has had increasing LE edema. He recently increased his Lasix due to lower extremity weeping. This is somewhat improved. Overall, his weight is up about 3-5 pounds. He denies syncope. Denies any further bleeding.    Past Medical History  Diagnosis Date  . Prostate cancer ([place])   . Neuropathy ([place])   . Hypercholesteremia   . Depressive disorder, not elsewhere classified   . Sinoatrial node dysfunction (HCC)   . Degeneration of lumbar or lumbosacral intervertebral disc   . CAD (coronary artery disease)      NYHA Class 1B, CABG 1985  . HTN (hypertension)   . OSA on CPAP   . Collagen vascular disease ([place])   . PE (pulmonary embolism) 03/2015  . Neuromuscular disorder (HCC)     neuropathy  . GERD (gastroesophageal reflux disease)   . Presence of permanent cardiac pacemaker     Past Surgical History  Procedure Laterality Date  . Coronary artery bypass graft    . Exploratory laparotomy    . Endarterectomy    . Rotator cuff repair    . Lumbar fusion    . Coronary angioplasty with stent placement      s/p bare metal stent implant SVG-diagonal 11/23/05, s/p BM stent implantation SVG diagonal 10/22/06 (feeds LAD), and 05/2010 with DES to left circumflex  . Pacemaker insertion    . Esophagogastroduodenoscopy (egd) with propofol N/A 08/26/2015    Procedure: ESOPHAGOGASTRODUODENOSCOPY (EGD) WITH PROPOFOL;  Surgeon: Clarene Essex, MD;  Location: [facility];  Service:  Endoscopy;  Laterality: N/A;  . Colonoscopy N/A 08/26/2015    Procedure: COLONOSCOPY;  Surgeon: Clarene Essex, MD;  Location: [facility] ENDOSCOPY;  Service: Endoscopy;  Laterality: N/A;  . Cardiac catheterization N/A 08/27/2015    Procedure: Left Heart Cath and Cors/Grafts Angiography;  Surgeon: Belva Crome, MD;  Location: [facility] CV LAB;  Service: Cardiovascular;  Laterality: N/A;    Current Medications: Outpatient Prescriptions Prior to Visit  Medication Sig Dispense  Refill  . abiraterone Acetate (ZYTIGA) 250 MG tablet Take 4 tablets (1,000 mg total) by mouth daily. Take on an empty stomach 1 hour before or 2 hours after a meal 120 tablet 3  . atorvastatin (LIPITOR) 40 MG tablet Take 1 tablet (40 mg total) by mouth every other day. 30 tablet 5  . Cholecalciferol (VITAMIN D3) 2000 UNITS TABS Take 2 tablets by mouth daily.     . citalopram (CELEXA) 20 MG tablet Take 20 mg by mouth daily.      . Cyanocobalamin (VITAMIN B-12 CR) 1000 MCG TBCR Take 1 tablet by mouth daily.    . folic acid (FOLVITE) 1 MG tablet Take 1 tablet (1 mg total) by mouth daily. 30 tablet 5  . gabapentin (NEURONTIN) 300 MG capsule Take 300 mg by mouth 2 (two) times daily. 2 times daily w/ additional dose as needed, not to exceed 3 times daily    . isosorbide mononitrate (IMDUR) 30 MG 24 hr tablet Take 1 tablet (30 mg total) by mouth daily. 30 tablet 5  . Melatonin 3 MG TABS Take 3 mg by mouth at bedtime.    . metoprolol succinate (TOPROL-XL) 25 MG 24 hr tablet TAKE ONE TABLET BY MOUTH ONCE DAILY 30 tablet 2  . Multiple Vitamin (MULTIVITAMIN) capsule Take 1 capsule by mouth daily.    . nitroGLYCERIN (NITROLINGUAL) 0.4 MG/SPRAY spray Place 1 spray under the tongue every 5 (five) minutes x 3 doses as needed for chest pain. 12 g 3  . pantoprazole (PROTONIX) 40 MG tablet Take 1 tablet (40 mg total) by mouth 2 (two) times daily before a meal. 60 tablet 5  . Polyethyl Glycol-Propyl Glycol (SYSTANE) 0.4-0.3 % SOLN Place 1 drop into both eyes at bedtime.     . predniSONE (DELTASONE) 5 MG tablet Take 5 mg by mouth daily with breakfast.    . UBIQUINOL PO Take 1 tablet by mouth daily. CoQ10    . warfarin (COUMADIN) 5 MG tablet TAKE ONE TABLET BY MOUTH ONCE DAILY AT  6  PM 30 tablet 0  . furosemide (LASIX) 20 MG tablet Take 1 tablet (20 mg total) by mouth daily. Take one to two (1-2) tablets (40 mg to 80 mg total) by mouth daily as needed for edema. 30 tablet 5  . b complex vitamins tablet Take 1 tablet  by mouth daily. Reported on 09/18/2015     No facility-administered medications prior to visit.     Allergies:   Penicillins and Simvastatin   Social History   Social History  . Marital Status: Married    Spouse Name: N/A  . Number of Children: 3  . Years of Education: N/A   Occupational History  . Retired Optometrist    Social History Main Topics  . Smoking status: Never Smoker   . Smokeless tobacco: Never Used  . Alcohol Use: No  . Drug Use: No  . Sexual Activity: Not Asked   Other Topics Concern  . None   Social History Narrative     Family  History:  The patient's family history includes Heart disease (age of onset: 57) in his father.   ROS:   Please see the history of present illness.    ROS All other systems reviewed and are negative.   Physical Exam:    VS:  BP 122/60 mmHg  Pulse 66  Ht 5\' 10"  (1.778 m)  Wt 228 lb 6.4 oz (103.602 kg)  BMI 32.77 kg/m2   GEN: Well nourished, well developed, in no acute distress HEENT: normal Neck: no JVD, no masses Cardiac: Normal 99991111, RRR; 1/6 systolic  Murmur RUSB, tight 1-2+ bilateral LE edema;  R groin without hematoma or bruit   Respiratory:  clear to auscultation bilaterally; no wheezing, rhonchi or rales GI: soft, nontender  MS: no deformity or atrophy Skin: warm and dry  Neuro:   no focal deficits  Psych: Alert and oriented x 3, normal affect  Wt Readings from Last 3 Encounters:  09/18/15 228 lb 6.4 oz (103.602 kg)  09/10/15 225 lb 1.6 oz (102.105 kg)  09/03/15 226 lb 4.8 oz (102.649 kg)      Studies/Labs Reviewed:     EKG:  EKG is   ordered today.  The ekg ordered today demonstrates atrial paced, HR 65, normal axis, septal Q waves, QTc 405 ms, no change from prior tracing  Recent Labs: 05/17/2015: Magnesium 2.5 08/24/2015: B Natriuretic Peptide 135.7* 09/10/2015: ALT 13; BUN 24.7; Creatinine 1.6*; HGB 10.2*; Platelets 186; Potassium 4.0; Sodium 143   Recent Lipid Panel    Component Value Date/Time     CHOL 139 08/24/2015 0343   TRIG 134 08/24/2015 0343   HDL 32* 08/24/2015 0343   CHOLHDL 4.3 08/24/2015 0343   VLDL 27 08/24/2015 0343   LDLCALC 80 08/24/2015 0343    Additional studies/ records that were reviewed today include:   LHC 08/27/15 LAD ostial 100%, distal 70% LCx OM1 100%, OM2 95%, 75%, lateral OM2 80% RCA ostial 70% ISR, RPDA ostial 100% SVG-OM1/OM 2 proximal 40% ISR, SVG-D1 proximal 40% ISR   Angiography is nearly impossible to perform from the femoral approach due to marked iliofemoral tortuosity which required use of 125 cm catheters. The tortuosity prevented catheter torque.  Widely patent sequential saphenous vein graft to the first and second obtuse marginal. Widely patent saphenous vein graft to the first diagonal.  Severe native vessel disease with high-grade diffuse significant stenoses throughout the native right some of which represents in-stent restenosis. Distal territory is meager. The PDA is totally occluded and supplied by left-to-right collaterals.  Left main coronary artery was never selectively engaged due to tortuosity as described above and lack of appropriate length catheters.  Native left coronary system has shown chronic total occlusion of the LAD in the past. The saphenous vein graft to the diagonal which supplies the LAD is widely patent.  The native circumflex territory contains high-grade obstruction proximal to the graft insertion site in the second obtuse marginal and thus threatens a large third obtuse marginal branch. There is antegrade significant disease beyond the graft insertion site in the 2nd obtuse marginal. The native circumflex ostial and proximal segment was stented greater than 7 years ago but could not be selectively engaged (as mentioned above under left main). RECOMMENDATIONS:  Source of angina could be the native right coronary, or the circumflex territory is setting of significant anemia. These territories by necessity will  have to be treated with medically as there are no interventional options.  He is not a candidate for triple therapy (dual  antiplatelet + coumadin/NOAC) and perhaps not even double drug therapy (antiplatlet +coumadin/NOAC). I would resume Xarelto or coumadin when safe and hemoglobin is stable to complete PE therapy. If recurrent bleeding, may need to switch to single antiplatelet therapy.  Transfuse to keep Hgb > 9.5 as this will help control angina.   Echo 08/25/15 Moderate LVH, EF 60-65%, normal wall motion, grade 1 diastolic dysfunction, mild aortic stenosis (mean 11 mmHg, peak 18 mmHg), mild AI, mildly dilated ascending aorta, mild LAE, mild TR  Myoview 7/15 Low risk; inferior scar, small apical scar, no ischemia, EF 48%   ASSESSMENT:     1. Atherosclerosis of native coronary artery of native heart without angina pectoris   2. Iron deficiency anemia due to chronic blood loss   3. Pulmonary embolism, other ([place])   4. Chronic diastolic heart failure ([place])   5. Essential hypertension, benign   6. Cardiac pacemaker -[employer]   7. Prostate cancer ([place])   8. CKD (chronic kidney disease), stage III     PLAN:     In order of problems listed above:  1. CAD - Recent admission with non-STEMI in the setting of worsening anemia (presumed bleeding). LHC demonstrated patent grafts and diffuse CAD without minimal targets to PCI. He has been treated medically.  He denies further chest discomfort. Continue statin, nitrates, beta blocker. He is not on aspirin as he is on Coumadin.  2. Anemia - Multifactorial and related to non-Hodgkin's lymphoma, chemotherapy, CKD and presumed recent GI bleeding.  Followed closely by Heme (Dr. Benay Spice).  3. History of pulmonary embolism - Xarelto D/Cd in the hospital secondary to presumed GI bleeding. He was switched to Eliquis but ultimately changed to Coumadin which is followed in the hematology/oncology clinic.  4. Chronic diastolic CHF -  He is NYHA  3. He does have some evidence of volume excess on exam. I suspect his edema is multifactorial (CHF, anemia, hypoalbuminemia, prior DVT, venous insufficiency). I have asked him to increase his Lasix to 60 mg daily for 1 week. He will then resume 40 mg daily. BMET will be obtained at the [facility] tomorrow and again on March 17.  5. HTN -   Controlled.  6. Pacemaker - Follow-up with EP as planned.  7. Prostate cancer - Follow-up with oncology as planned.  8. CKD - Keep close eye on renal function and K+ with adjustments in Lasix.     Medication Adjustments/Labs and Tests Ordered: Current medicines are reviewed at length with the patient today.  Concerns regarding medicines are outlined above.  Medication changes, Labs and Tests ordered today are outlined in the Patient Instructions noted below. Patient Instructions  Medication Instructions:  Your physician has recommended you make the following change in your medication:  1.  INCREASE the Lasix to 20 mg taking 3 tablets a day for 1 week then go down to 2 tablets daily  Labwork: AT [facility] 09/19/15:  BMET AT [facility] 10/11/15:  BMET   PLEASE HAVE THEM FAX [place] ALL THE LAB RESULTS TO [place], PA-C TO: [phone]  Testing/Procedures: Burman Blacksmith  Follow-Up: Your physician recommends that you keep your scheduled follow-up appointment AS PLANNED WITH DR. Caryl Comes Your physician recommends that you schedule a follow-up appointment in: [place]  Any Other Special Instructions Will Be Listed Below (If Applicable).  If you need a refill on your cardiac medications before your next appointment, please call your pharmacy.   Signed, Richardson Dopp,  PA-C  09/18/2015 1:21 PM    [facility] [address], [place], [place]  [address] Phone: [phone]; Fax: [phone]

## 2015-09-18 ENCOUNTER — Encounter: Payer: Self-pay | Admitting: Physician Assistant

## 2015-09-18 ENCOUNTER — Ambulatory Visit (INDEPENDENT_AMBULATORY_CARE_PROVIDER_SITE_OTHER): Payer: Medicare Other | Admitting: Physician Assistant

## 2015-09-18 VITALS — BP 122/60 | HR 66 | Ht 70.0 in | Wt 228.4 lb

## 2015-09-18 DIAGNOSIS — I5032 Chronic diastolic (congestive) heart failure: Secondary | ICD-10-CM

## 2015-09-18 DIAGNOSIS — N183 Chronic kidney disease, stage 3 unspecified: Secondary | ICD-10-CM

## 2015-09-18 DIAGNOSIS — I2510 Atherosclerotic heart disease of native coronary artery without angina pectoris: Secondary | ICD-10-CM

## 2015-09-18 DIAGNOSIS — Z45018 Encounter for adjustment and management of other part of cardiac pacemaker: Secondary | ICD-10-CM

## 2015-09-18 DIAGNOSIS — C61 Malignant neoplasm of prostate: Secondary | ICD-10-CM

## 2015-09-18 DIAGNOSIS — I2699 Other pulmonary embolism without acute cor pulmonale: Secondary | ICD-10-CM

## 2015-09-18 DIAGNOSIS — D500 Iron deficiency anemia secondary to blood loss (chronic): Secondary | ICD-10-CM | POA: Diagnosis not present

## 2015-09-18 DIAGNOSIS — I25119 Atherosclerotic heart disease of native coronary artery with unspecified angina pectoris: Secondary | ICD-10-CM

## 2015-09-18 DIAGNOSIS — I10 Essential (primary) hypertension: Secondary | ICD-10-CM

## 2015-09-18 MED ORDER — FUROSEMIDE 20 MG PO TABS: ORAL_TABLET | ORAL | Status: DC

## 2015-09-18 NOTE — Patient Instructions (Addendum)
Medication Instructions:  Your physician has recommended you make the following change in your medication:  1.  INCREASE the Lasix to 20 mg taking 3 tablets a day for 1 week then go down to 2 tablets daily  Labwork: AT [facility] 09/19/15:  BMET AT [facility] 10/11/15:  BMET   PLEASE HAVE THEM FAX [place] ALL THE LAB RESULTS TO [place], PA-C TO: [phone]  Testing/Procedures: Burman Blacksmith  Follow-Up: Your physician recommends that you keep your scheduled follow-up appointment AS PLANNED WITH DR. Caryl Comes Your physician recommends that you schedule a follow-up appointment in: [place]  Any Other Special Instructions Will Be Listed Below (If Applicable).  If you need a refill on your cardiac medications before your next appointment, please call your pharmacy.

## 2015-09-19 ENCOUNTER — Other Ambulatory Visit: Payer: Self-pay | Admitting: Physician Assistant

## 2015-09-19 ENCOUNTER — Other Ambulatory Visit (HOSPITAL_BASED_OUTPATIENT_CLINIC_OR_DEPARTMENT_OTHER): Payer: Medicare Other

## 2015-09-19 ENCOUNTER — Telehealth: Payer: Self-pay | Admitting: *Deleted

## 2015-09-19 DIAGNOSIS — I82402 Acute embolism and thrombosis of unspecified deep veins of left lower extremity: Secondary | ICD-10-CM

## 2015-09-19 DIAGNOSIS — Z45018 Encounter for adjustment and management of other part of cardiac pacemaker: Secondary | ICD-10-CM

## 2015-09-19 DIAGNOSIS — I2699 Other pulmonary embolism without acute cor pulmonale: Secondary | ICD-10-CM

## 2015-09-19 DIAGNOSIS — I10 Essential (primary) hypertension: Secondary | ICD-10-CM | POA: Diagnosis not present

## 2015-09-19 DIAGNOSIS — I5032 Chronic diastolic (congestive) heart failure: Secondary | ICD-10-CM | POA: Diagnosis not present

## 2015-09-19 DIAGNOSIS — I2510 Atherosclerotic heart disease of native coronary artery without angina pectoris: Secondary | ICD-10-CM | POA: Diagnosis not present

## 2015-09-19 DIAGNOSIS — I739 Peripheral vascular disease, unspecified: Secondary | ICD-10-CM

## 2015-09-19 LAB — PROTIME-INR
INR: 3.3 (ref 2.00–3.50)
PROTIME: 39.6 s — AB (ref 10.6–13.4)

## 2015-09-19 NOTE — Telephone Encounter (Signed)
Spoke with pt and his wife regarding Coumadin dosing.  Per Dr. Benay Spice, pt is to hold Coumadin on 2/23 and 2/24. On Monday, Wednesday and Friday pt is to take a 5 mg dose. On Tuesday, Thursday, Saturday and Sunday pt is to decrease dose to 2.5 mg.  Pt to return on 09/26/15 for PT/INR per Dr. Benay Spice.  Teach back done.

## 2015-09-20 ENCOUNTER — Telehealth: Payer: Self-pay | Admitting: *Deleted

## 2015-09-20 LAB — BASIC METABOLIC PANEL
BUN/Creatinine Ratio: 13 (ref 10–22)
BUN: 20 mg/dL (ref 8–27)
CALCIUM: 9.3 mg/dL (ref 8.6–10.2)
CO2: 23 mmol/L (ref 18–29)
CREATININE: 1.54 mg/dL — AB (ref 0.76–1.27)
Chloride: 103 mmol/L (ref 96–106)
GFR calc Af Amer: 47 mL/min/{1.73_m2} — ABNORMAL LOW (ref >59)
GFR, EST NON AFRICAN AMERICAN: 40 mL/min/{1.73_m2} — AB (ref >59)
GLUCOSE: 81 mg/dL (ref 65–99)
Potassium: 4.1 mmol/L (ref 3.5–5.2)
Sodium: 140 mmol/L (ref 134–144)

## 2015-09-20 NOTE — Telephone Encounter (Signed)
Pt notified of lab results by phone with verbal understanding.  

## 2015-09-23 ENCOUNTER — Telehealth: Payer: Self-pay | Admitting: Physician Assistant

## 2015-09-23 NOTE — Telephone Encounter (Signed)
Oxygen level in left arm is 83 and his right arm is 97.Pt is very concerned.

## 2015-09-24 ENCOUNTER — Telehealth: Payer: Self-pay | Admitting: Oncology

## 2015-09-24 NOTE — Telephone Encounter (Signed)
I s/w pt today about his c/o his O2 level in his arms. I asked pt about O2 level and asked did he have a pulse ox to measure his O2. He said yes. He said left arm is now at 97 = to right arm. Pt asked if could this be a sign blockage. I advised could be, though could be anything such as poor circulation maybe caused by a possible nerve problem as well. Pt states this has resolved now. I advised pt to keep an eye on this and to keep his upcoming appts with Dr. Caryl Comes and Dr. Tamala Julian. Pt agreeable.

## 2015-09-24 NOTE — Telephone Encounter (Signed)
S/w pt, gave appt for 3/2 @ 11am.

## 2015-09-26 ENCOUNTER — Other Ambulatory Visit (HOSPITAL_BASED_OUTPATIENT_CLINIC_OR_DEPARTMENT_OTHER): Payer: Medicare Other

## 2015-09-26 ENCOUNTER — Telehealth: Payer: Self-pay | Admitting: *Deleted

## 2015-09-26 ENCOUNTER — Telehealth: Payer: Self-pay | Admitting: Oncology

## 2015-09-26 DIAGNOSIS — Z45018 Encounter for adjustment and management of other part of cardiac pacemaker: Secondary | ICD-10-CM

## 2015-09-26 DIAGNOSIS — I739 Peripheral vascular disease, unspecified: Secondary | ICD-10-CM

## 2015-09-26 DIAGNOSIS — I82402 Acute embolism and thrombosis of unspecified deep veins of left lower extremity: Secondary | ICD-10-CM | POA: Diagnosis not present

## 2015-09-26 LAB — PROTIME-INR
INR: 1.7 — ABNORMAL LOW (ref 2.00–3.50)
Protime: 20.4 Seconds — ABNORMAL HIGH (ref 10.6–13.4)

## 2015-09-26 NOTE — Telephone Encounter (Signed)
Spoke with patient to confirm 3/9 lab only appt per 3/2 pof

## 2015-09-26 NOTE — Telephone Encounter (Signed)
Spoke with pt's wife, she reports he missed a dose last night. INR reviewed by Dr. Benay Spice: Continue same dose, check lab in one week. Order to schedulers for lab appt. Left message on voicemail with instructions to continue Coumadin 5 mg MWF. 2.5 mg T,TH,S,Sun.

## 2015-09-29 ENCOUNTER — Encounter: Payer: Self-pay | Admitting: Oncology

## 2015-10-01 ENCOUNTER — Telehealth: Payer: Self-pay | Admitting: Pharmacist

## 2015-10-01 NOTE — Telephone Encounter (Signed)
..  Oral Chemotherapy Follow-Up Form  Original Start date of oral chemotherapy: _10/15/16______   Called patient today to follow up regarding patient's oral chemotherapy medication: Zytiga___  Pt is doing well today. He states that he has no problems with Zytiga and has missed no doses. He is becoming more mobile. His wife states that their greatest concern right now is with Coumadin.   They were not sure how to take it this past week and took Coumadin 2.5 mg rather than 5 mg on Friday. I read them the note in the chart which states Coumadin 5 mg MWF, 2.5 mg T,TH,Sat, Sun. He will have his INR checked again on Thursday and they state that they will follow the directions they are given at that time.  Pt reports __0__ tablets/doses missed in the last week/month.    Will follow up and call patient again in ____4 weeks_____   Thank you,  Theone Murdoch, PharmD, [facility]

## 2015-10-02 ENCOUNTER — Other Ambulatory Visit: Payer: Self-pay | Admitting: *Deleted

## 2015-10-02 DIAGNOSIS — I2699 Other pulmonary embolism without acute cor pulmonale: Secondary | ICD-10-CM

## 2015-10-03 ENCOUNTER — Other Ambulatory Visit (HOSPITAL_BASED_OUTPATIENT_CLINIC_OR_DEPARTMENT_OTHER): Payer: Medicare Other

## 2015-10-03 ENCOUNTER — Other Ambulatory Visit: Payer: Self-pay | Admitting: Oncology

## 2015-10-03 ENCOUNTER — Other Ambulatory Visit: Payer: Self-pay | Admitting: Physician Assistant

## 2015-10-03 ENCOUNTER — Telehealth: Payer: Self-pay | Admitting: *Deleted

## 2015-10-03 DIAGNOSIS — I2699 Other pulmonary embolism without acute cor pulmonale: Secondary | ICD-10-CM | POA: Diagnosis present

## 2015-10-03 LAB — PROTIME-INR
INR: 1.4 — ABNORMAL LOW (ref 2.00–3.50)
PROTIME: 16.8 s — AB (ref 10.6–13.4)

## 2015-10-03 NOTE — Telephone Encounter (Signed)
Spoke with pt's wife, he has not missed any Coumadin doses. She wrote down instructions to take Coumadin 5 mg daily except 2.5 mg on MWF- instructions per Dr. Benay Spice. Will recheck labs next visit. She voiced understanding.

## 2015-10-03 NOTE — Telephone Encounter (Signed)
-----   Message from Ladell Pier, MD sent at 10/03/2015 12:26 PM EST ----- Please call patient, change coumadin to 2.5mg  m,w,r, 5mg  other days, check PT 2 weeks-next visit if sooner

## 2015-10-07 ENCOUNTER — Other Ambulatory Visit: Payer: Self-pay | Admitting: Oncology

## 2015-10-07 DIAGNOSIS — C61 Malignant neoplasm of prostate: Secondary | ICD-10-CM

## 2015-10-07 MED ORDER — ABIRATERONE ACETATE 250 MG PO TABS: 1000.0000 mg | ORAL_TABLET | Freq: Every day | ORAL | Status: DC

## 2015-10-07 MED FILL — ZYTIGA 250 MG TABLET: 250 | 30 days supply | Qty: 120 | Fill #0

## 2015-10-11 ENCOUNTER — Telehealth: Payer: Self-pay | Admitting: Oncology

## 2015-10-11 ENCOUNTER — Other Ambulatory Visit (HOSPITAL_BASED_OUTPATIENT_CLINIC_OR_DEPARTMENT_OTHER): Payer: Medicare Other

## 2015-10-11 ENCOUNTER — Ambulatory Visit (HOSPITAL_BASED_OUTPATIENT_CLINIC_OR_DEPARTMENT_OTHER): Payer: Medicare Other | Admitting: Oncology

## 2015-10-11 VITALS — BP 137/50 | HR 68 | Temp 96.8°F | Resp 18 | Ht 70.0 in | Wt 225.6 lb

## 2015-10-11 DIAGNOSIS — N183 Chronic kidney disease, stage 3 unspecified: Secondary | ICD-10-CM

## 2015-10-11 DIAGNOSIS — N289 Disorder of kidney and ureter, unspecified: Secondary | ICD-10-CM | POA: Diagnosis not present

## 2015-10-11 DIAGNOSIS — D696 Thrombocytopenia, unspecified: Secondary | ICD-10-CM

## 2015-10-11 DIAGNOSIS — I2699 Other pulmonary embolism without acute cor pulmonale: Secondary | ICD-10-CM | POA: Diagnosis not present

## 2015-10-11 DIAGNOSIS — I482 Chronic atrial fibrillation, unspecified: Secondary | ICD-10-CM

## 2015-10-11 DIAGNOSIS — D649 Anemia, unspecified: Secondary | ICD-10-CM

## 2015-10-11 DIAGNOSIS — C61 Malignant neoplasm of prostate: Secondary | ICD-10-CM

## 2015-10-11 DIAGNOSIS — Z8572 Personal history of non-Hodgkin lymphomas: Secondary | ICD-10-CM

## 2015-10-11 DIAGNOSIS — Z7901 Long term (current) use of anticoagulants: Secondary | ICD-10-CM

## 2015-10-11 DIAGNOSIS — I2782 Chronic pulmonary embolism: Secondary | ICD-10-CM

## 2015-10-11 LAB — CBC WITH DIFFERENTIAL/PLATELET
BASO%: 0.5 % (ref 0.0–2.0)
Basophils Absolute: 0 10*3/uL (ref 0.0–0.1)
EOS ABS: 0.2 10*3/uL (ref 0.0–0.5)
EOS%: 2.4 % (ref 0.0–7.0)
HCT: 31.3 % — ABNORMAL LOW (ref 38.4–49.9)
HEMOGLOBIN: 10.3 g/dL — AB (ref 13.0–17.1)
LYMPH%: 20.2 % (ref 14.0–49.0)
MCH: 32.5 pg (ref 27.2–33.4)
MCHC: 32.9 g/dL (ref 32.0–36.0)
MCV: 98.8 fL — ABNORMAL HIGH (ref 79.3–98.0)
MONO#: 0.5 10*3/uL (ref 0.1–0.9)
MONO%: 6.1 % (ref 0.0–14.0)
NEUT%: 70.8 % (ref 39.0–75.0)
NEUTROS ABS: 5.4 10*3/uL (ref 1.5–6.5)
PLATELETS: 160 10*3/uL (ref 140–400)
RBC: 3.17 10*6/uL — AB (ref 4.20–5.82)
RDW: 17.2 % — AB (ref 11.0–14.6)
WBC: 7.7 10*3/uL (ref 4.0–10.3)
lymph#: 1.6 10*3/uL (ref 0.9–3.3)

## 2015-10-11 LAB — COMPREHENSIVE METABOLIC PANEL
ALBUMIN: 3.8 g/dL (ref 3.5–5.0)
ALK PHOS: 50 U/L (ref 40–150)
ALT: 13 U/L (ref 0–55)
AST: 20 U/L (ref 5–34)
Anion Gap: 10 mEq/L (ref 3–11)
BUN: 28.3 mg/dL — AB (ref 7.0–26.0)
CALCIUM: 9.7 mg/dL (ref 8.4–10.4)
CHLORIDE: 105 meq/L (ref 98–109)
CO2: 29 mEq/L (ref 22–29)
CREATININE: 1.7 mg/dL — AB (ref 0.7–1.3)
EGFR: 35 mL/min/{1.73_m2} — ABNORMAL LOW (ref >90)
Glucose: 128 mg/dl (ref 70–140)
POTASSIUM: 4 meq/L (ref 3.5–5.1)
Sodium: 144 mEq/L (ref 136–145)
Total Bilirubin: 0.85 mg/dL (ref 0.20–1.20)
Total Protein: 6.8 g/dL (ref 6.4–8.3)

## 2015-10-11 LAB — PROTIME-INR
INR: 1.9 — AB (ref 2.00–3.50)
PROTIME: 22.8 s — AB (ref 10.6–13.4)

## 2015-10-11 NOTE — Progress Notes (Signed)
  [place] OFFICE PROGRESS NOTE   Diagnosis: Prostate cancer, non-Hodgkin's lymphoma  INTERVAL HISTORY:   Edward Woodward returns as scheduled. He feels well. No bleeding. He has an improved energy level. He continues abiraterone/prednisone  Objective:  Vital signs in last 24 hours:  Blood pressure 137/50, pulse 68, temperature 96.8 F (36 C), temperature source Oral, resp. rate 18, height 5\' 10"  (1.778 m), weight 225 lb 9.6 oz (102.331 kg), SpO2 98 %.    HEENT: Neck without mass Resp: Coarse rhonchi at the posterior basis, no respiratory distress Cardio: Regular rate and rhythm GI: No hepatomegaly, nontender Vascular: Trace low leg edema bilaterally  Skin: Small ecchymoses at the pretibial area bilaterally     Lab Results:  Lab Results  Component Value Date   WBC 7.7 10/11/2015   HGB 10.3* 10/11/2015   HCT 31.3* 10/11/2015   MCV 98.8* 10/11/2015   PLT 160 10/11/2015   NEUTROABS 5.4 10/11/2015   PT/INR-1.9  BUN 28.3, creatinine 1.7, calcium 9.7, albumin 3.8  PSA on 09/03/2015: 1.1  Medications: I have reviewed the patient's current medications.  Assessment/Plan: 1. Non-Hodgkin's lymphoma (follicular grade 3 lymphoma) - status post 6 cycles of Cytoxan/prednisone/rituximab 07/01/2007 through 10/21/2007. He remains in clinical remission. 2. CT chest 04/01/2015 with no lymphadenopathy  CT abdomen/pelvis 05/01/2015 with mild progression of abdomen/pelvic lymphadenopathy 3. Prostate cancer - he has been treated with hormonal therapy, the PSA was stable on 10/15/2014  PSA increased 04/26/2015   Bone scan 05/09/2015 with unchanged increased activity in the thoracic and lumbar spine felt to most likely be degenerative.   Initiation of Abiraterone and prednisone 05/11/2015. Normalization of the PSA on Abiraterone. 4. History of a normocytic anemia secondary to non-Hodgkin's lymphoma, chemotherapy, and renal insufficiency - progressive secondary to GI  bleeding, stable 5. History of Mild thrombocytopenia  6. History of bilateral hydronephrosis. Renal ultrasound 09/26/2014 consistent with medical renal disease. No hydronephrosis. 7. Coronary artery disease - status post coronary artery stent placement. 8. History of noncalcified lung nodules. 9. History of severe neutropenia July 2009 - likely related to delayed toxicity from rituximab. 10. Macular degeneration. 11. Right middle lobe density on a CT of the chest 06/18/2010 measuring 2.5 x 1.6 cm with mildly increased metabolic activity (SUV max 2.5) on a PET scan 06/27/2010. The area of increased density was less confluent and appeared smaller on a restaging CT 09/08/2010. Right middle lobe "scarring" with no new or suspicious pulmonary nodule on a CT 08/04/2011. 12. Mild increased metabolic activity associated with several lymph nodes on the PET scan 06/27/2010 - likely related to lymphoma. No lymphadenopathy in the mediastinum or axillary areas on the CT 08/04/2011. 13. Right hip discomfort-he received a steroid injection by his primary physician without improvement. He saw Dr. Collier Salina and there was a question of a lytic process in the right pelvis. A bone scan on 09/21/2012 revealed no evidence of metastatic disease. 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 Renal insufficiency 15. Left leg DVT and pulmonary embolism 04/01/2015-on Coumadin 16. Admission 08/23/2015 with severe anemia and GI bleeding  Upper and lower endoscopy 08/26/2015 without a clear source for bleeding identified  Disposition:  Edward Woodward appears well. He will continue Abiraterone/prednisone. He continues Coumadin anticoagulation. The hemoglobin is stable. We will follow-up on the PSA from today. He will return for an office visit in one month.  [name], MD  10/11/2015  2:01 PM

## 2015-10-11 NOTE — Telephone Encounter (Signed)
Pt confirmed labs/ov per 03/17 POF, gave pt AVS and Calendar... KJ, per POF pt was to be scheduled with NP/LT on 04/13 LT is on PAL,

## 2015-10-12 LAB — PSA: PROSTATE SPECIFIC AG, SERUM: 0.7 ng/mL (ref 0.0–4.0)

## 2015-10-21 ENCOUNTER — Encounter: Payer: Self-pay | Admitting: Internal Medicine

## 2015-10-21 ENCOUNTER — Ambulatory Visit (INDEPENDENT_AMBULATORY_CARE_PROVIDER_SITE_OTHER): Payer: Medicare Other | Admitting: Internal Medicine

## 2015-10-21 VITALS — BP 150/70 | HR 80 | Ht 70.0 in | Wt 228.4 lb

## 2015-10-21 DIAGNOSIS — I4891 Unspecified atrial fibrillation: Secondary | ICD-10-CM | POA: Diagnosis not present

## 2015-10-21 DIAGNOSIS — I495 Sick sinus syndrome: Secondary | ICD-10-CM

## 2015-10-21 DIAGNOSIS — Z950 Presence of cardiac pacemaker: Secondary | ICD-10-CM

## 2015-10-21 DIAGNOSIS — I25119 Atherosclerotic heart disease of native coronary artery with unspecified angina pectoris: Secondary | ICD-10-CM

## 2015-10-21 LAB — CUP PACEART INCLINIC DEVICE CHECK
Brady Statistic RV Percent Paced: 0 %
Date Time Interrogation Session: 20170327040000
Implantable Lead Implant Date: 20081013
Implantable Lead Model: 4470
Lead Channel Impedance Value: 550 Ohm
Lead Channel Sensing Intrinsic Amplitude: 1.7 mV
Lead Channel Setting Pacing Amplitude: 2.4 V
Lead Channel Setting Pacing Pulse Width: 0.8 ms
MDC IDC LEAD IMPLANT DT: 20081013
MDC IDC LEAD LOCATION: 753859
MDC IDC LEAD LOCATION: 753860
MDC IDC LEAD MODEL: 4457
MDC IDC LEAD SERIAL: 205344
MDC IDC LEAD SERIAL: 210110
MDC IDC MSMT LEADCHNL RA PACING THRESHOLD AMPLITUDE: 0.4 V
MDC IDC MSMT LEADCHNL RA PACING THRESHOLD PULSEWIDTH: 0.4 ms
MDC IDC MSMT LEADCHNL RV IMPEDANCE VALUE: 440 Ohm
MDC IDC MSMT LEADCHNL RV PACING THRESHOLD AMPLITUDE: 1.3 V
MDC IDC MSMT LEADCHNL RV PACING THRESHOLD PULSEWIDTH: 0.8 ms
MDC IDC MSMT LEADCHNL RV SENSING INTR AMPL: 7.1 mV
MDC IDC PG SERIAL: 317986
MDC IDC SET LEADCHNL RA PACING AMPLITUDE: 2 V
MDC IDC SET LEADCHNL RV SENSING SENSITIVITY: 2.5 mV
MDC IDC STAT BRADY RA PERCENT PACED: 0 %
Pulse Gen Model: 1297

## 2015-10-21 MED ORDER — TORSEMIDE 20 MG PO TABS: 20.0000 mg | ORAL_TABLET | Freq: Two times a day (BID) | ORAL | Status: DC

## 2015-10-21 MED ORDER — METOPROLOL SUCCINATE ER 50 MG PO TB24: 50.0000 mg | ORAL_TABLET | Freq: Every day | ORAL | Status: DC

## 2015-10-21 NOTE — Progress Notes (Signed)
Patient Care Team: Lajean Manes, MD as PCP - General (Internal Medicine) Ladell Pier, MD as Attending Physician (Hematology and Oncology) Myrlene Broker, MD as Attending Physician (Urology)   HPI  Edward Woodward is a 82 y.o. male Seen to establish pacemaker followup needed number of years ago for sinus node dysfunction and presyncope. He has alleviated his symptoms.  He also has a history of coronary artery disease with mild left ventricular dysfunction with EF of 50% 2011.  He has no history of atrial fibrillation but was documented time of his pneumothorax that was a consequence of a fall 12/14 .  Functional status is stable.    Past Medical History  Diagnosis Date  . Prostate cancer ([place])   . Neuropathy ([place])   . Hypercholesteremia   . Depressive disorder, not elsewhere classified   . Sinoatrial node dysfunction (HCC)   . Degeneration of lumbar or lumbosacral intervertebral disc   . CAD (coronary artery disease)      NYHA Class 1B, CABG 1985  . HTN (hypertension)   . OSA on CPAP   . Collagen vascular disease ([place])   . PE (pulmonary embolism) 03/2015  . Neuromuscular disorder (HCC)     neuropathy  . GERD (gastroesophageal reflux disease)   . Presence of permanent cardiac pacemaker     Past Surgical History  Procedure Laterality Date  . Coronary artery bypass graft    . Exploratory laparotomy    . Endarterectomy    . Rotator cuff repair    . Lumbar fusion    . Coronary angioplasty with stent placement      s/p bare metal stent implant SVG-diagonal 11/23/05, s/p BM stent implantation SVG diagonal 10/22/06 (feeds LAD), and 05/2010 with DES to left circumflex  . Pacemaker insertion    . Esophagogastroduodenoscopy (egd) with propofol N/A 08/26/2015    Procedure: ESOPHAGOGASTRODUODENOSCOPY (EGD) WITH PROPOFOL;  Surgeon: Clarene Essex, MD;  Location[facility] END[facility]COPY;  Service: Endoscopy;  Laterality: N/A;  . Colonoscopy N/A 08/26/2015    Procedure:  COLONOSCOPY;  Surgeon: Clarene Essex, MD;  Location: [facility] ENDOSCOPY;  Service: Endoscopy;  Laterality: N/A;  . Cardiac catheterization N/A 08/27/2015    Procedure: Left Heart Cath and Cors/Grafts Angiography;  Surgeon: Belva Crome, MD;  Location: [place] CV LAB;  Service: Cardiovascular;  Laterality: N/A;    Current Outpatient Prescriptions  Medication Sig Dispense Refill  . abiraterone Acetate (ZYTIGA) 250 MG tablet Take 4 tablets (1,000 mg total) by mouth daily. Take on an empty stomach 1 hour before or 2 hours after a meal 120 tablet 3  . atorvastatin (LIPITOR) 40 MG tablet Take 1 tablet (40 mg total) by mouth every other day. 30 tablet 5  . Cholecalciferol (VITAMIN D3) 2000 UNITS TABS Take 2 tablets by mouth daily.     . citalopram (CELEXA) 20 MG tablet Take 20 mg by mouth daily.      . Cyanocobalamin (VITAMIN B-12 CR) 1000 MCG TBCR Take 1 tablet by mouth daily.    . folic acid (FOLVITE) 1 MG tablet Take 1 tablet (1 mg total) by mouth daily. 30 tablet 5  . furosemide (LASIX) 20 MG tablet Take three (3) tablets (60 mg total) by mouth daily for 1 week then go back to taking 2 tablet daily 187 tablet 3  . gabapentin (NEURONTIN) 300 MG capsule Take 300 mg by mouth 2 (two) times daily. 2 times daily w/ additional dose as needed, not to exceed  3 times daily    . isosorbide mononitrate (IMDUR) 30 MG 24 hr tablet Take 1 tablet (30 mg total) by mouth daily. 30 tablet 5  . Melatonin 3 MG TABS Take 3 mg by mouth at bedtime.    . metoprolol succinate (TOPROL-XL) 25 MG 24 hr tablet TAKE ONE TABLET BY MOUTH ONCE DAILY 30 tablet 0  . Multiple Vitamin (MULTIVITAMIN) capsule Take 1 capsule by mouth daily.    . nitroGLYCERIN (NITROLINGUAL) 0.4 MG/SPRAY spray Place 1 spray under the tongue every 5 (five) minutes x 3 doses as needed for chest pain. 12 g 3  . pantoprazole (PROTONIX) 40 MG tablet Take 1 tablet (40 mg total) by mouth 2 (two) times daily before a meal. 60 tablet 5  . Polyethyl Glycol-Propyl Glycol  (SYSTANE) 0.4-0.3 % SOLN Place 1 drop into both eyes at bedtime.     . predniSONE (DELTASONE) 5 MG tablet Take 5 mg by mouth daily with breakfast.    . UBIQUINOL PO Take 1 tablet by mouth daily. CoQ10    . warfarin (COUMADIN) 5 MG tablet TAKE ONE TABLET BY MOUTH ONCE DAILY AT  6  PM 30 tablet 0   No current facility-administered medications for this visit.    Allergies  Allergen Reactions  . Penicillins Swelling  . Simvastatin Other (See Comments)    Muscle weakness in legs ?    Review of Systems negative except from HPI and PMH  Physical Exam BP 150/70 mmHg  Pulse 80  Ht 5\' 10"  (1.778 m)  Wt 228 lb 6.4 oz (103.602 kg)  BMI 32.77 kg/m2 Well developed and nourished in no acute distress HENT normal Neck supple with 7-8 cm Clear Regular rate and rhythm, decreased S1 2/6 murmur  Abd-soft with active BS No Clubbing cyanosis L greater than the right 2+ edema Skin-warm and dry A & Oriented  Grossly normal sensory and motor function     Assessment and Plan  Sinus node dysfunction  Atrial fibrillation   Coronary artery disease  Hypertension   Pacemaker-[employer]     There is no significant detected atrial fibrillation <1 min)  He is significantly volume overloaded. We will change his furosemide--torsemide given the amount of edema. We will start him on 20 mg a day with instructions to increase to 40 mg a day as needed. He is to see Dr. Rebecca Eaton next week; reassessment of edema and renal function will be important  With his elevated blood pressure, concordant with 45 at home, we will increase his metoprolol from 25--50 mg a day.

## 2015-10-21 NOTE — Patient Instructions (Signed)
Medication Instructions: 1) Stop lasix (furosemide) 2) Start demadex (torsemide) 20 mg take one tablet by mouth twice daily as directed 3) Increase toprol (metoprolol succinate) to 50 mg once daily  Labwork: - none  Procedures/Testing: - none  Follow-Up: - Your physician wants you to follow-up in: 6 months with the Device clinic & 1 year with Chanetta Marshall, NP for Dr. Caryl Comes. You will receive a reminder letter in the mail two months in advance. If you don't receive a letter, please call our office to schedule the follow-up appointment.   Any Additional Special Instructions Will Be Listed Below (If Applicable).     If you need a refill on your cardiac medications before your next appointment, please call your pharmacy.

## 2015-10-24 DIAGNOSIS — H43813 Vitreous degeneration, bilateral: Secondary | ICD-10-CM | POA: Diagnosis not present

## 2015-10-24 DIAGNOSIS — H35353 Cystoid macular degeneration, bilateral: Secondary | ICD-10-CM | POA: Diagnosis not present

## 2015-10-24 DIAGNOSIS — H353232 Exudative age-related macular degeneration, bilateral, with inactive choroidal neovascularization: Secondary | ICD-10-CM | POA: Diagnosis not present

## 2015-10-24 DIAGNOSIS — Z961 Presence of intraocular lens: Secondary | ICD-10-CM | POA: Diagnosis not present

## 2015-10-29 ENCOUNTER — Telehealth: Payer: Self-pay | Admitting: Pharmacist

## 2015-10-29 NOTE — Telephone Encounter (Signed)
10/29/15: Attempted to reach patient for follow up on oral medication: Zytiga. No answer. Left VM for patient to call back with any questions or issues.   Thank you,  Montel Clock, PharmD, [facility] [phone]

## 2015-10-31 ENCOUNTER — Encounter: Payer: Self-pay | Admitting: Interventional Cardiology

## 2015-10-31 ENCOUNTER — Ambulatory Visit (INDEPENDENT_AMBULATORY_CARE_PROVIDER_SITE_OTHER): Payer: Medicare Other | Admitting: Interventional Cardiology

## 2015-10-31 VITALS — BP 130/68 | HR 66 | Ht 70.0 in | Wt 223.0 lb

## 2015-10-31 DIAGNOSIS — I495 Sick sinus syndrome: Secondary | ICD-10-CM

## 2015-10-31 DIAGNOSIS — I5022 Chronic systolic (congestive) heart failure: Secondary | ICD-10-CM | POA: Diagnosis not present

## 2015-10-31 DIAGNOSIS — I482 Chronic atrial fibrillation, unspecified: Secondary | ICD-10-CM

## 2015-10-31 DIAGNOSIS — I25810 Atherosclerosis of coronary artery bypass graft(s) without angina pectoris: Secondary | ICD-10-CM | POA: Diagnosis not present

## 2015-10-31 DIAGNOSIS — I10 Essential (primary) hypertension: Secondary | ICD-10-CM

## 2015-10-31 DIAGNOSIS — I2699 Other pulmonary embolism without acute cor pulmonale: Secondary | ICD-10-CM

## 2015-10-31 DIAGNOSIS — Z45018 Encounter for adjustment and management of other part of cardiac pacemaker: Secondary | ICD-10-CM

## 2015-10-31 DIAGNOSIS — E785 Hyperlipidemia, unspecified: Secondary | ICD-10-CM

## 2015-10-31 DIAGNOSIS — I2510 Atherosclerotic heart disease of native coronary artery without angina pectoris: Secondary | ICD-10-CM

## 2015-10-31 LAB — BASIC METABOLIC PANEL
BUN: 27 mg/dL — ABNORMAL HIGH (ref 7–25)
CALCIUM: 9.3 mg/dL (ref 8.6–10.3)
CO2: 27 mmol/L (ref 20–31)
CREATININE: 1.63 mg/dL — AB (ref 0.70–1.11)
Chloride: 104 mmol/L (ref 98–110)
Glucose, Bld: 90 mg/dL (ref 65–99)
Potassium: 4 mmol/L (ref 3.5–5.3)
Sodium: 142 mmol/L (ref 135–146)

## 2015-10-31 LAB — BRAIN NATRIURETIC PEPTIDE: BRAIN NATRIURETIC PEPTIDE: 133.8 pg/mL — AB (ref <100)

## 2015-10-31 NOTE — Progress Notes (Signed)
Cardiology Office Note   Date:  10/31/2015   ID:  Edward Woodward, DOB January 11, 1927, MRN [id number]  PCP:  Mathews Argyle, MD  Cardiologist:  Sinclair Grooms, MD   Chief Complaint  Patient presents with  . Coronary Artery Disease       History of Present Illness: Edward Woodward is a 82 y.o. male who presents for Sinus node dysfunction, permanent pacemaker, CAD with prior coronary stents and coronary bypass surgery, chronic kidney disease, history of paroxysmal atrial fibrillation, essential hypertension, hyperlipidemia.  He saw Dr. Caryl Comes slightly greater than 2 weeks ago. Because of edema is diuretic regimen was adjusted. He complained of significant lower extremity swelling. Today he complains of dyspnea on exertion. Torsemide 40 mg daily was started. There has been a 5 pound reduction in weight with this adjustment. No change in his breathing. He denies angina. No syncope or lightheadedness.  Past Medical History  Diagnosis Date  . Prostate cancer ([place])   . Neuropathy ([place])   . Hypercholesteremia   . Depressive disorder, not elsewhere classified   . Sinoatrial node dysfunction (HCC)   . Degeneration of lumbar or lumbosacral intervertebral disc   . CAD (coronary artery disease)      NYHA Class 1B, CABG 1985  . HTN (hypertension)   . OSA on CPAP   . Collagen vascular disease ([place])   . PE (pulmonary embolism) 03/2015  . Neuromuscular disorder (HCC)     neuropathy  . GERD (gastroesophageal reflux disease)   . Presence of permanent cardiac pacemaker     Past Surgical History  Procedure Laterality Date  . Coronary artery bypass graft    . Exploratory laparotomy    . Endarterectomy    . Rotator cuff repair    . Lumbar fusion    . Coronary angioplasty with stent placement      s/p bare metal stent implant SVG-diagonal 11/23/05, s/p BM stent implantation SVG diagonal 10/22/06 (feeds LAD), and 05/2010 with DES to left circumflex  . Pacemaker insertion    .  Esophagogastroduodenoscopy (egd) with propofol N/A 08/26/2015    Procedure: ESOPHAGOGASTRODUODENOSCOPY (EGD) WITH PROPOFOL;  Surgeon: Clarene Essex, MD;  Location: [facility] ENDOSCOPY;  Service: Endoscopy;  Laterality: N/A;  . Colonoscopy N/A 08/26/2015    Procedure: COLONOSCOPY;  Surgeon: Clarene Essex, MD;  Location: [facility] ENDOSCOPY;  Service: Endoscopy;  Laterality: N/A;  . Cardiac catheterization N/A 08/27/2015    Procedure: Left Heart Cath and Cors/Grafts Angiography;  Surgeon: Belva Crome, MD;  Location: [place] CV LAB;  Service: Cardiovascular;  Laterality: N/A;     Current Outpatient Prescriptions  Medication Sig Dispense Refill  . abiraterone Acetate (ZYTIGA) 250 MG tablet Take 4 tablets (1,000 mg total) by mouth daily. Take on an empty stomach 1 hour before or 2 hours after a meal 120 tablet 3  . atorvastatin (LIPITOR) 40 MG tablet Take 1 tablet (40 mg total) by mouth every other day. 30 tablet 5  . Cholecalciferol (VITAMIN D3) 2000 UNITS TABS Take 2 tablets by mouth daily.     . citalopram (CELEXA) 20 MG tablet Take 20 mg by mouth daily.      . Cyanocobalamin (VITAMIN B-12 CR) 1000 MCG TBCR Take 1 tablet by mouth daily.    . folic acid (FOLVITE) 1 MG tablet Take 1 tablet (1 mg total) by mouth daily. 30 tablet 5  . gabapentin (NEURONTIN) 300 MG capsule Take 300 mg by mouth 2 (two) times daily. 2 times daily w/  additional dose as needed, not to exceed 3 times daily    . isosorbide mononitrate (IMDUR) 30 MG 24 hr tablet Take 1 tablet (30 mg total) by mouth daily. 30 tablet 5  . Melatonin 3 MG TABS Take 3 mg by mouth at bedtime.    . metoprolol succinate (TOPROL-XL) 50 MG 24 hr tablet Take 1 tablet (50 mg total) by mouth daily. Take with or immediately following a meal. 90 tablet 3  . Multiple Vitamin (MULTIVITAMIN) capsule Take 1 capsule by mouth daily.    . nitroGLYCERIN (NITROLINGUAL) 0.4 MG/SPRAY spray Place 1 spray under the tongue every 5 (five) minutes x 3 doses as needed for chest pain. 12 g  3  . pantoprazole (PROTONIX) 40 MG tablet Take 1 tablet (40 mg total) by mouth 2 (two) times daily before a meal. 60 tablet 5  . Polyethyl Glycol-Propyl Glycol (SYSTANE) 0.4-0.3 % SOLN Place 1 drop into both eyes at bedtime.     . predniSONE (DELTASONE) 5 MG tablet Take 5 mg by mouth daily with breakfast.    . torsemide (DEMADEX) 20 MG tablet Take 1 tablet (20 mg total) by mouth 2 (two) times daily. 180 tablet 3  . UBIQUINOL PO Take 1 tablet by mouth daily. CoQ10    . warfarin (COUMADIN) 5 MG tablet TAKE ONE TABLET BY MOUTH ONCE DAILY AT  6  PM 30 tablet 0   No current facility-administered medications for this visit.    Allergies:   Penicillins and Simvastatin    Social History:  The patient  reports that he has never smoked. He has never used smokeless tobacco. He reports that he does not drink alcohol or use illicit drugs.   Family History:  The patient's family history includes Heart disease (age of onset: 63) in his father.    ROS:  Please see the history of present illness.   Otherwise, review of systems are positive for hearing loss, leg pain, leg swelling, unexplained weight gain, easy bruising, difficulty with balance and walking..   All other systems are reviewed and negative.    PHYSICAL EXAM: VS:  BP 130/68 mmHg  Pulse 66  Ht 5\' 10"  (1.778 m)  Wt 223 lb (101.152 kg)  BMI 32.00 kg/m2 , BMI Body mass index is 32 kg/(m^2). GEN: Well nourished, well developed, in no acute distress HEENT: normal Neck: no JVD, carotid bruits, or masses Cardiac: RRR.  There is no murmur, rub, or gallop. There is bilateral 2-3+ edema despite support stockings.Marland Kitchen Respiratory:  clear to auscultation bilaterally, normal work of breathing. GI: soft, nontender, nondistended, + BS MS: no deformity or atrophy Skin: warm and dry, no rash Neuro:  Strength and sensation are intact Psych: euthymic mood, full affect   EKG:  EKG is not ordered today. The ekg reveals that on last tracing in February  2017 that was atrial pacing.   Recent Labs: 05/17/2015: Magnesium 2.5 08/24/2015: B Natriuretic Peptide 135.7* 10/11/2015: ALT 13; BUN 28.3*; Creatinine 1.7*; HGB 10.3*; Platelets 160; Potassium 4.0; Sodium 144    Lipid Panel    Component Value Date/Time   CHOL 139 08/24/2015 0343   TRIG 134 08/24/2015 0343   HDL 32* 08/24/2015 0343   CHOLHDL 4.3 08/24/2015 0343   VLDL 27 08/24/2015 0343   LDLCALC 80 08/24/2015 0343      Wt Readings from Last 3 Encounters:  10/31/15 223 lb (101.152 kg)  10/21/15 228 lb 6.4 oz (103.602 kg)  10/11/15 225 lb 9.6 oz (102.331 kg)  Other studies Reviewed: Additional studies/ records that were reviewed today include: Reviewed weights and Dr. Olin Pia last note.. The findings include BUN/creatinine creatinine were 28 and 1.71 Demadex was started. No follow-up laboratory data since that time..    ASSESSMENT AND PLAN:  1. Sinus node dysfunction (HCC) Asymptomatic and treated with pacemaker  2. Cardiac pacemaker -[employer] Normal function when recently treated  3. Chronic diastolic heart failure (HCC) Lungs are clear but there is significant bilateral lower extremity edema.  4. Chronic atrial fibrillation (HCC) When last evaluated, rhythm was atrial paced and not atrial fibrillation.  5. Essential hypertension, benign Controlled  6. Atherosclerosis of native coronary artery of native heart without angina pectoris Previous bypass surgery and both native and graft stenting. Stable without angina.  7. Pulmonary embolism, other ([place]) History of pulmonary embolism and likely pulmonary hypertension and for much of the patient's lower extremity swelling  8. Hyperlipidemia Not evaluated on this occasion    Current medicines are reviewed at length with the patient today.  The patient has the following concerns regarding medicines: No changes are made today.  The following changes/actions have been instituted:    Basic metabolic  panel and brain naturally peptide today.  Further discussion with the patient after blood work is known to determine if more intense diuretic therapy as needed  It is likely that lower extremity swelling is from venous insufficiency and pulmonary hypertension given history of prior PE  Labs/ tests ordered today include:  No orders of the defined types were placed in this encounter.     Disposition:   FU with HS in 6 weeks  Signed, Sinclair Grooms, MD  10/31/2015 10:16 AM    [address], [place], [place]  [address] Phone: [phone]; Fax: [phone]

## 2015-10-31 NOTE — Patient Instructions (Signed)
Medication Instructions:  Your physician recommends that you continue on your current medications as directed. Please refer to the Current Medication list given to you today.   Labwork: Bmet, Bnp today  Testing/Procedures: None ordered  Follow-Up: You have follow up appointment scheduled on 12/04/15 @ 10am with Dr.Smith  Any Other Special Instructions Will Be Listed Below (If Applicable).     If you need a refill on your cardiac medications before your next appointment, please call your pharmacy.

## 2015-11-01 NOTE — Addendum Note (Signed)
Addended by: Andres Ege on: 11/01/2015 12:17 PM   Modules accepted: Medications

## 2015-11-04 ENCOUNTER — Other Ambulatory Visit (HOSPITAL_BASED_OUTPATIENT_CLINIC_OR_DEPARTMENT_OTHER): Payer: Medicare Other

## 2015-11-04 ENCOUNTER — Encounter: Payer: Self-pay | Admitting: Pharmacist

## 2015-11-04 ENCOUNTER — Encounter: Payer: Self-pay | Admitting: *Deleted

## 2015-11-04 ENCOUNTER — Telehealth: Payer: Self-pay | Admitting: Oncology

## 2015-11-04 ENCOUNTER — Ambulatory Visit (HOSPITAL_BASED_OUTPATIENT_CLINIC_OR_DEPARTMENT_OTHER): Payer: Medicare Other | Admitting: Oncology

## 2015-11-04 VITALS — BP 123/52 | HR 65 | Temp 97.5°F | Resp 18 | Ht 70.0 in | Wt 229.0 lb

## 2015-11-04 DIAGNOSIS — I2782 Chronic pulmonary embolism: Secondary | ICD-10-CM

## 2015-11-04 DIAGNOSIS — Z7901 Long term (current) use of anticoagulants: Secondary | ICD-10-CM

## 2015-11-04 DIAGNOSIS — I480 Paroxysmal atrial fibrillation: Secondary | ICD-10-CM

## 2015-11-04 DIAGNOSIS — N183 Chronic kidney disease, stage 3 unspecified: Secondary | ICD-10-CM

## 2015-11-04 DIAGNOSIS — C61 Malignant neoplasm of prostate: Secondary | ICD-10-CM

## 2015-11-04 DIAGNOSIS — Z86718 Personal history of other venous thrombosis and embolism: Secondary | ICD-10-CM

## 2015-11-04 DIAGNOSIS — Z8572 Personal history of non-Hodgkin lymphomas: Secondary | ICD-10-CM

## 2015-11-04 DIAGNOSIS — I482 Chronic atrial fibrillation, unspecified: Secondary | ICD-10-CM

## 2015-11-04 DIAGNOSIS — N289 Disorder of kidney and ureter, unspecified: Secondary | ICD-10-CM

## 2015-11-04 DIAGNOSIS — D649 Anemia, unspecified: Secondary | ICD-10-CM | POA: Diagnosis not present

## 2015-11-04 LAB — PROTIME-INR
INR: 1.5 — ABNORMAL LOW (ref 2.00–3.50)
PROTIME: 18 s — AB (ref 10.6–13.4)

## 2015-11-04 LAB — COMPREHENSIVE METABOLIC PANEL
ALBUMIN: 3.7 g/dL (ref 3.5–5.0)
ALK PHOS: 51 U/L (ref 40–150)
ALT: 10 U/L (ref 0–55)
AST: 19 U/L (ref 5–34)
Anion Gap: 11 mEq/L (ref 3–11)
BILIRUBIN TOTAL: 0.88 mg/dL (ref 0.20–1.20)
BUN: 34.4 mg/dL — AB (ref 7.0–26.0)
CALCIUM: 9.6 mg/dL (ref 8.4–10.4)
CO2: 28 mEq/L (ref 22–29)
CREATININE: 1.8 mg/dL — AB (ref 0.7–1.3)
Chloride: 105 mEq/L (ref 98–109)
EGFR: 34 mL/min/{1.73_m2} — ABNORMAL LOW (ref >90)
Glucose: 106 mg/dl (ref 70–140)
Potassium: 3.8 mEq/L (ref 3.5–5.1)
Sodium: 144 mEq/L (ref 136–145)
TOTAL PROTEIN: 6.5 g/dL (ref 6.4–8.3)

## 2015-11-04 LAB — CBC WITH DIFFERENTIAL/PLATELET
BASO%: 0.4 % (ref 0.0–2.0)
BASOS ABS: 0 10*3/uL (ref 0.0–0.1)
EOS%: 2 % (ref 0.0–7.0)
Eosinophils Absolute: 0.1 10*3/uL (ref 0.0–0.5)
HEMATOCRIT: 29.3 % — AB (ref 38.4–49.9)
HEMOGLOBIN: 9.8 g/dL — AB (ref 13.0–17.1)
LYMPH#: 1.5 10*3/uL (ref 0.9–3.3)
LYMPH%: 24.4 % (ref 14.0–49.0)
MCH: 32.9 pg (ref 27.2–33.4)
MCHC: 33.3 g/dL (ref 32.0–36.0)
MCV: 98.8 fL — ABNORMAL HIGH (ref 79.3–98.0)
MONO#: 0.5 10*3/uL (ref 0.1–0.9)
MONO%: 7.5 % (ref 0.0–14.0)
NEUT#: 4.1 10*3/uL (ref 1.5–6.5)
NEUT%: 65.7 % (ref 39.0–75.0)
Platelets: 160 10*3/uL (ref 140–400)
RBC: 2.97 10*6/uL — ABNORMAL LOW (ref 4.20–5.82)
RDW: 16.2 % — AB (ref 11.0–14.6)
WBC: 6.2 10*3/uL (ref 4.0–10.3)

## 2015-11-04 MED FILL — ZYTIGA 250 MG TABLET: 250 | 30 days supply | Qty: 120 | Fill #1

## 2015-11-04 NOTE — Progress Notes (Signed)
[place] OFFICE PROGRESS NOTE   Diagnosis: Prostate cancer, non-Hodgkin's lymphoma  INTERVAL HISTORY:   [name] returns as scheduled. He continues zytiga and prednisone. He complains of persistent leg edema on the left greater than right. He saw Dr. Tamala Julian last week. He continues Coumadin.  Objective:  Vital signs in last 24 hours:  Blood pressure 123/52, pulse 65, temperature 97.5 F (36.4 C), temperature source Oral, resp. rate 18, height 5\' 10"  (1.778 m), weight 229 lb (103.874 kg), SpO2 95 %.    HEENT: Neck without mass Lymphatics: No cervical or supra-clavicular nodes, lipoma? In the left posterior scalene region Resp: Coarse rhonchi at the left greater than right base, no respiratory distress Cardio: Regular rate and rhythm GI: No hepatosplenomegaly Vascular: Trace edema throughout the left leg, trace edema at the right lower leg   Lab Results:  Lab Results  Component Value Date   WBC 6.2 11/04/2015   HGB 9.8* 11/04/2015   HCT 29.3* 11/04/2015   MCV 98.8* 11/04/2015   PLT 160 11/04/2015   NEUTROABS 4.1 11/04/2015  10/11/2015: PSA  0.7  11/04/2015: PT/INR-1.5  Medications: I have reviewed the patient's current medications.  Assessment/Plan: 1. Non-Hodgkin's lymphoma (follicular grade 3 lymphoma) - status post 6 cycles of Cytoxan/prednisone/rituximab 07/01/2007 through 10/21/2007. He remains in clinical remission. 2. CT chest 04/01/2015 with no lymphadenopathy  CT abdomen/pelvis 05/01/2015 with mild progression of abdomen/pelvic lymphadenopathy 3. Prostate cancer - he has been treated with hormonal therapy, the PSA was stable on 10/15/2014  PSA increased 04/26/2015   Bone scan 05/09/2015 with unchanged increased activity in the thoracic and lumbar spine felt to most likely be degenerative.   Initiation of Abiraterone and prednisone 05/11/2015. Normalization of the PSA on Abiraterone. 4. History of a normocytic anemia secondary to  non-Hodgkin's lymphoma, chemotherapy, and renal insufficiency - progressive secondary to GI bleeding, stable 5. History of Mild thrombocytopenia  6. History of bilateral hydronephrosis. Renal ultrasound 09/26/2014 consistent with medical renal disease. No hydronephrosis. 7. Coronary artery disease - status post coronary artery stent placement. 8. History of noncalcified lung nodules. 9. History of severe neutropenia July 2009 - likely related to delayed toxicity from rituximab. 10. Macular degeneration. 11. Right middle lobe density on a CT of the chest 06/18/2010 measuring 2.5 x 1.6 cm with mildly increased metabolic activity (SUV max 2.5) on a PET scan 06/27/2010. The area of increased density was less confluent and appeared smaller on a restaging CT 09/08/2010. Right middle lobe "scarring" with no new or suspicious pulmonary nodule on a CT 08/04/2011. 12. Mild increased metabolic activity associated with several lymph nodes on the PET scan 06/27/2010 - likely related to lymphoma. No lymphadenopathy in the mediastinum or axillary areas on the CT 08/04/2011. 13. Right hip discomfort-he received a steroid injection by his primary physician without improvement. He saw Dr. Collier Salina and there was a question of a lytic process in the right pelvis. A bone scan on 09/21/2012 revealed no evidence of metastatic disease. 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 Renal insufficiency 15. Left leg DVT and pulmonary embolism 04/01/2015-on Coumadin 16. Admission 08/23/2015 with severe anemia and GI bleeding  Upper and lower endoscopy 08/26/2015 without a clear source for bleeding identified    Disposition:  He appears stable. The plan is to continue Abiraterone/prednisone. We will follow-up the PSA from today.  The hemoglobin is stable. We adjusted the Coumadin dose today and he will return for a PT/INR on 11/21/2015.  The left  leg edema is likely related to prednisone and  the left femoral DVT diagnosed in September 2016.  [name], MD  11/04/2015  10:36 AM

## 2015-11-04 NOTE — Progress Notes (Signed)
Pt.'s wife notified to change Coumadin to 5 mg daily except on Wednesday pt is to take 2.5 mg.  Teach back done.  Pt.'s wife has no questions at this time and is appreciative of call.

## 2015-11-04 NOTE — Telephone Encounter (Signed)
per pof to sch pt appt-gave pt copy of avs °

## 2015-11-05 LAB — PSA: PROSTATE SPECIFIC AG, SERUM: 0.4 ng/mL (ref 0.0–4.0)

## 2015-11-11 ENCOUNTER — Other Ambulatory Visit: Payer: Self-pay | Admitting: Oncology

## 2015-11-12 ENCOUNTER — Encounter: Payer: Self-pay | Admitting: Interventional Cardiology

## 2015-11-12 NOTE — Telephone Encounter (Signed)
Called pt as he requested, adv pt that his lab results were released to my chart for him to review. Verbally went over lab results as well. Pt verbalized  Understanding.Pt voiced appreciation for the call

## 2015-11-21 ENCOUNTER — Other Ambulatory Visit (HOSPITAL_BASED_OUTPATIENT_CLINIC_OR_DEPARTMENT_OTHER): Payer: Medicare Other

## 2015-11-21 DIAGNOSIS — Z86718 Personal history of other venous thrombosis and embolism: Secondary | ICD-10-CM | POA: Diagnosis present

## 2015-11-21 DIAGNOSIS — I2782 Chronic pulmonary embolism: Secondary | ICD-10-CM

## 2015-11-21 DIAGNOSIS — I480 Paroxysmal atrial fibrillation: Secondary | ICD-10-CM

## 2015-11-21 DIAGNOSIS — Z7901 Long term (current) use of anticoagulants: Secondary | ICD-10-CM

## 2015-11-21 LAB — PROTIME-INR
INR: 1.8 — AB (ref 2.00–3.50)
PROTIME: 21.6 s — AB (ref 10.6–13.4)

## 2015-11-22 ENCOUNTER — Telehealth: Payer: Self-pay | Admitting: Medical Oncology

## 2015-11-22 NOTE — Telephone Encounter (Signed)
Wife and pt notified . He is taking 5 mg daily except on wed 1/2 tab.

## 2015-11-22 NOTE — Telephone Encounter (Signed)
-----   Message from Ladell Pier, MD sent at 11/21/2015  6:02 PM EDT ----- Please call patient, same coumadin, f/u as scheduled

## 2015-11-26 ENCOUNTER — Telehealth: Payer: Self-pay | Admitting: Pharmacist

## 2015-11-26 NOTE — Telephone Encounter (Signed)
11/26/15: Attempted to reach patient for follow up on oral medication: Zytiga. No answer. Left VM for patient to call back with any questions or issues.   Thank you,  Montel Clock, PharmD, [facility] [phone]

## 2015-12-03 MED FILL — ZYTIGA 250 MG TABLET: 250 | 30 days supply | Qty: 120 | Fill #2

## 2015-12-04 ENCOUNTER — Encounter: Payer: Self-pay | Admitting: Interventional Cardiology

## 2015-12-04 ENCOUNTER — Ambulatory Visit (INDEPENDENT_AMBULATORY_CARE_PROVIDER_SITE_OTHER): Payer: Medicare Other | Admitting: Interventional Cardiology

## 2015-12-04 VITALS — BP 132/60 | HR 66 | Ht 70.0 in | Wt 227.4 lb

## 2015-12-04 DIAGNOSIS — I482 Chronic atrial fibrillation, unspecified: Secondary | ICD-10-CM

## 2015-12-04 DIAGNOSIS — I25810 Atherosclerosis of coronary artery bypass graft(s) without angina pectoris: Secondary | ICD-10-CM | POA: Diagnosis not present

## 2015-12-04 DIAGNOSIS — Z950 Presence of cardiac pacemaker: Secondary | ICD-10-CM

## 2015-12-04 DIAGNOSIS — I4891 Unspecified atrial fibrillation: Secondary | ICD-10-CM

## 2015-12-04 DIAGNOSIS — I495 Sick sinus syndrome: Secondary | ICD-10-CM

## 2015-12-04 DIAGNOSIS — I2510 Atherosclerotic heart disease of native coronary artery without angina pectoris: Secondary | ICD-10-CM | POA: Diagnosis not present

## 2015-12-04 DIAGNOSIS — I5022 Chronic systolic (congestive) heart failure: Secondary | ICD-10-CM

## 2015-12-04 NOTE — Progress Notes (Signed)
Cardiology Office Note   Date:  12/04/2015   ID:  Edward Woodward, DOB 1926-09-25, MRN [id number]  PCP:  Mathews Argyle, MD  Cardiologist:  Sinclair Grooms, MD   Chief Complaint  Patient presents with  . Coronary Artery Disease      History of Present Illness: Edward Woodward is a 82 y.o. male who presents for Chronic systolic/diastolic heart failure, permanent pacemaker insertion, atrial fibrillation, hypertension, history of pulmonary emboli, chronic venous insufficiency with peripheral edema, and obstructive sleep apnea.  When last seen a month to 6 weeks ago, the patient was complaining of lower extremity edema and dyspnea. We did bloodwork and my plan was to make adjustments in diuretic therapy based upon kidney function and BNP. Blood work was unremarkable with a BNP of 134, BUN and creatinine of 27 and 1.63 respectively. Potassium was 4.0. No change was made. The patient is back today in follow-up and upon questioning states that he feels better. States that the swelling is less severe although his weight is not significantly different.  Past Medical History  Diagnosis Date  . Prostate cancer ([place])   . Neuropathy ([place])   . Hypercholesteremia   . Depressive disorder, not elsewhere classified   . Sinoatrial node dysfunction (HCC)   . Degeneration of lumbar or lumbosacral intervertebral disc   . CAD (coronary artery disease)      NYHA Class 1B, CABG 1985  . HTN (hypertension)   . OSA on CPAP   . Collagen vascular disease ([place])   . PE (pulmonary embolism) 03/2015  . Neuromuscular disorder (HCC)     neuropathy  . GERD (gastroesophageal reflux disease)   . Presence of permanent cardiac pacemaker     Past Surgical History  Procedure Laterality Date  . Coronary artery bypass graft    . Exploratory laparotomy    . Endarterectomy    . Rotator cuff repair    . Lumbar fusion    . Coronary angioplasty with stent placement      s/p bare metal stent implant  SVG-diagonal 11/23/05, s/p BM stent implantation SVG diagonal 10/22/06 (feeds LAD), and 05/2010 with DES to left circumflex  . Pacemaker insertion    . Esophagogastroduodenoscopy (egd) with propofol N/A 08/26/2015    Procedure: ESOPHAGOGASTRODUODENOSCOPY (EGD) WITH PROPOFOL;  Surgeon: Clarene Essex, MD;  Location: [facility] ENDOSCOPY;  Service: Endoscopy;  Laterality: N/A;  . Colonoscopy N/A 08/26/2015    Procedure: COLONOSCOPY;  Surgeon: Clarene Essex, MD;  Location: [facility] ENDOSCOPY;  Service: Endoscopy;  Laterality: N/A;  . Cardiac catheterization N/A 08/27/2015    Procedure: Left Heart Cath and Cors/Grafts Angiography;  Surgeon: Belva Crome, MD;  Location: [place] CV LAB;  Service: Cardiovascular;  Laterality: N/A;     Current Outpatient Prescriptions  Medication Sig Dispense Refill  . abiraterone Acetate (ZYTIGA) 250 MG tablet Take 4 tablets (1,000 mg total) by mouth daily. Take on an empty stomach 1 hour before or 2 hours after a meal 120 tablet 3  . atorvastatin (LIPITOR) 40 MG tablet Take 1 tablet (40 mg total) by mouth every other day. 30 tablet 5  . Cholecalciferol (VITAMIN D3) 2000 UNITS TABS Take 2 tablets by mouth daily.     . citalopram (CELEXA) 20 MG tablet Take 20 mg by mouth daily.      . Cyanocobalamin (VITAMIN B-12 CR) 1000 MCG TBCR Take 1 tablet by mouth daily.    . folic acid (FOLVITE) 1 MG tablet Take 1 tablet (1 mg  total) by mouth daily. 30 tablet 5  . gabapentin (NEURONTIN) 300 MG capsule Take 300 mg by mouth 2 (two) times daily. not to exceed 3 times daily    . isosorbide mononitrate (IMDUR) 30 MG 24 hr tablet Take 1 tablet (30 mg total) by mouth daily. 30 tablet 5  . Melatonin 3 MG TABS Take 3 mg by mouth at bedtime.    . metoprolol succinate (TOPROL-XL) 50 MG 24 hr tablet Take 1 tablet (50 mg total) by mouth daily. Take with or immediately following a meal. 90 tablet 3  . Multiple Vitamin (MULTIVITAMIN) capsule Take 1 capsule by mouth daily.    . nitroGLYCERIN (NITROLINGUAL) 0.4  MG/SPRAY spray Place 1 spray under the tongue every 5 (five) minutes x 3 doses as needed for chest pain. 12 g 3  . pantoprazole (PROTONIX) 40 MG tablet Take 1 tablet (40 mg total) by mouth 2 (two) times daily before a meal. 60 tablet 5  . Polyethyl Glycol-Propyl Glycol (SYSTANE) 0.4-0.3 % SOLN Place 1 drop into both eyes at bedtime.     . predniSONE (DELTASONE) 5 MG tablet Take 5 mg by mouth daily with breakfast.    . torsemide (DEMADEX) 20 MG tablet Take 1 tablet (20 mg total) by mouth 2 (two) times daily. 180 tablet 3  . UBIQUINOL PO Take 1 tablet by mouth daily. CoQ10    . warfarin (COUMADIN) 5 MG tablet Take 1 tablet (5 mg total) by mouth daily at 6 PM. Except on Wed take half tablet. 30 tablet 0   No current facility-administered medications for this visit.    Allergies:   Penicillins and Simvastatin    Social History:  The patient  reports that he has never smoked. He has never used smokeless tobacco. He reports that he does not drink alcohol or use illicit drugs.   Family History:  The patient's family history includes Heart disease (age of onset: 100) in his father.    ROS:  Please see the history of present illness.   Otherwise, review of systems are positive for ankle swelling and fatigue..   All other systems are reviewed and negative.    PHYSICAL EXAM: VS:  BP 132/60 mmHg  Pulse 66  Ht 5\' 10"  (1.778 m)  Wt 227 lb 6.4 oz (103.148 kg)  BMI 32.63 kg/m2 , BMI Body mass index is 32.63 kg/(m^2). GEN: Well nourished, well developed, in no acute distress HEENT: normal Neck: no JVD, carotid bruits, or masses Cardiac: IIRR.  There is no murmur, rub, or gallop. There is 2-3+ ankle to thigh edema bilaterally.  Respiratory:  clear to auscultation bilaterally, normal work of breathing. GI: soft, nontender, nondistended, + BS MS: no deformity or atrophy Skin: warm and dry, no rash Neuro:  Strength and sensation are intact Psych: euthymic mood, full affect   EKG:  EKG is not  ordered today.    Recent Labs: 05/17/2015: Magnesium 2.5 08/24/2015: B Natriuretic Peptide 135.7* 11/04/2015: ALT 10; BUN 34.4*; Creatinine 1.8*; HGB 9.8*; Platelets 160; Potassium 3.8; Sodium 144    Lipid Panel    Component Value Date/Time   CHOL 139 08/24/2015 0343   TRIG 134 08/24/2015 0343   HDL 32* 08/24/2015 0343   CHOLHDL 4.3 08/24/2015 0343   VLDL 27 08/24/2015 0343   LDLCALC 80 08/24/2015 0343      Wt Readings from Last 3 Encounters:  12/04/15 227 lb 6.4 oz (103.148 kg)  11/04/15 229 lb (103.874 kg)  10/31/15 223 lb (101.152 kg)  Other studies Reviewed: Additional studies/ records that were reviewed today include: Reviewed laboratory data. The findings include discuss laboratory data with the patient.    ASSESSMENT AND PLAN:  1. Sinus node dysfunction (HCC) Stable and managed with pacemaker  2. Chronic systolic heart failure (HCC) Lower extremity edema but no evidence of neck vein distention or pulmonary congestion. BNP was 134 one month ago. Peripheral edema is felt to be mostly related to venous insufficiency in this patient with a history of prior pulmonary emboli  3. Chronic atrial fibrillation (HCC) Stable with good rate control  4. Atherosclerosis of native coronary artery of native heart without angina pectoris Denies angina  5. Cardiac pacemaker in situ  Normal functioning the last evaluation performed.     Current medicines are reviewed at length with the patient today.  The patient has the following concerns regarding medicines: None.  The following changes/actions have been instituted:    Double torsemide for 2 doses, i.e. 40 mg this evening and 40 mg tomorrow.  If the first dose least a greater than 3 pound diuresis, go back to 20 mg twice a day instead of taking the second 40 mg dose.  Weight daily and call results to our office early next week so that we can determine the net effect of diuresis.  Labs/ tests ordered today  include:  No orders of the defined types were placed in this encounter.     Disposition:   FU with HS in 6 months  Signed, Sinclair Grooms, MD  12/04/2015 10:17 AM    [address], [place], [place]  [id number] Phone: [phone]; Fax: [phone]

## 2015-12-04 NOTE — Patient Instructions (Signed)
Medication Instructions:  Take 40mg  of Lasix today and 40mg  tomorrow. Then Resume Lasix 20mg  daily   Labwork: None ordered   Testing/Procedures: None ordered   Follow-Up: Your physician wants you to follow-up in: 6 months with Dr.Smith You will receive a reminder letter in the mail two months in advance. If you don't receive a letter, please call our office to schedule the follow-up appointment.   Any Other Special Instructions Will Be Listed Below (If Applicable). Weigh yourself when you get home, and each morning under the same circumstances.If tomorrow your weight is down by  3lbs just resume your 20mg  daily. Call the office early next week to give an update on your swelling and weight.     If you need a refill on your cardiac medications before your next appointment, please call your pharmacy.

## 2015-12-09 ENCOUNTER — Ambulatory Visit (HOSPITAL_BASED_OUTPATIENT_CLINIC_OR_DEPARTMENT_OTHER): Payer: Medicare Other | Admitting: Nurse Practitioner

## 2015-12-09 ENCOUNTER — Other Ambulatory Visit (HOSPITAL_BASED_OUTPATIENT_CLINIC_OR_DEPARTMENT_OTHER): Payer: Medicare Other

## 2015-12-09 ENCOUNTER — Telehealth: Payer: Self-pay | Admitting: Oncology

## 2015-12-09 VITALS — BP 151/58 | HR 65 | Temp 97.7°F | Resp 20 | Ht 70.0 in | Wt 226.1 lb

## 2015-12-09 DIAGNOSIS — I2699 Other pulmonary embolism without acute cor pulmonale: Secondary | ICD-10-CM

## 2015-12-09 DIAGNOSIS — D649 Anemia, unspecified: Secondary | ICD-10-CM | POA: Diagnosis not present

## 2015-12-09 DIAGNOSIS — N289 Disorder of kidney and ureter, unspecified: Secondary | ICD-10-CM

## 2015-12-09 DIAGNOSIS — Z8572 Personal history of non-Hodgkin lymphomas: Secondary | ICD-10-CM | POA: Diagnosis not present

## 2015-12-09 DIAGNOSIS — C61 Malignant neoplasm of prostate: Secondary | ICD-10-CM

## 2015-12-09 DIAGNOSIS — I82402 Acute embolism and thrombosis of unspecified deep veins of left lower extremity: Secondary | ICD-10-CM

## 2015-12-09 DIAGNOSIS — Z7901 Long term (current) use of anticoagulants: Secondary | ICD-10-CM

## 2015-12-09 DIAGNOSIS — I2782 Chronic pulmonary embolism: Secondary | ICD-10-CM

## 2015-12-09 DIAGNOSIS — I480 Paroxysmal atrial fibrillation: Secondary | ICD-10-CM

## 2015-12-09 LAB — CBC WITH DIFFERENTIAL/PLATELET
BASO%: 0.3 % (ref 0.0–2.0)
Basophils Absolute: 0 10*3/uL (ref 0.0–0.1)
EOS ABS: 0.1 10*3/uL (ref 0.0–0.5)
EOS%: 1.3 % (ref 0.0–7.0)
HEMATOCRIT: 32.2 % — AB (ref 38.4–49.9)
HGB: 10.5 g/dL — ABNORMAL LOW (ref 13.0–17.1)
LYMPH%: 18.7 % (ref 14.0–49.0)
MCH: 31.6 pg (ref 27.2–33.4)
MCHC: 32.5 g/dL (ref 32.0–36.0)
MCV: 97.3 fL (ref 79.3–98.0)
MONO#: 0.5 10*3/uL (ref 0.1–0.9)
MONO%: 7.4 % (ref 0.0–14.0)
NEUT%: 72.3 % (ref 39.0–75.0)
NEUTROS ABS: 5.1 10*3/uL (ref 1.5–6.5)
PLATELETS: 127 10*3/uL — AB (ref 140–400)
RBC: 3.31 10*6/uL — AB (ref 4.20–5.82)
RDW: 14.6 % (ref 11.0–14.6)
WBC: 7.1 10*3/uL (ref 4.0–10.3)
lymph#: 1.3 10*3/uL (ref 0.9–3.3)

## 2015-12-09 LAB — PROTIME-INR
INR: 3.2 (ref 2.00–3.50)
Protime: 38.4 Seconds — ABNORMAL HIGH (ref 10.6–13.4)

## 2015-12-09 LAB — BASIC METABOLIC PANEL
Anion Gap: 10 mEq/L (ref 3–11)
BUN: 30.6 mg/dL — AB (ref 7.0–26.0)
CALCIUM: 10 mg/dL (ref 8.4–10.4)
CHLORIDE: 103 meq/L (ref 98–109)
CO2: 30 meq/L — AB (ref 22–29)
CREATININE: 1.7 mg/dL — AB (ref 0.7–1.3)
EGFR: 36 mL/min/{1.73_m2} — ABNORMAL LOW (ref >90)
GLUCOSE: 85 mg/dL (ref 70–140)
Potassium: 4.3 mEq/L (ref 3.5–5.1)
SODIUM: 143 meq/L (ref 136–145)

## 2015-12-09 NOTE — Telephone Encounter (Signed)
Gave and printed appt sched and avs for pt for May and June  °

## 2015-12-09 NOTE — Patient Instructions (Signed)
   Decrease Prednisone to 2.5 mg (1/2 tab) daily for 1 week, then 2.5 mg (1/2 tab) every other day for 1 week, then STOP  Hold coumadin today; resume tomorrow--2.5 mg (1/2 tab) on MWF and 5 mg (1 tab) other days

## 2015-12-09 NOTE — Progress Notes (Signed)
Edward Woodward OFFICE PROGRESS NOTE   Diagnosis:  Prostate cancer, non-Hodgkin's lymphoma  INTERVAL HISTORY:   [name] returns as scheduled. He continues Abiraterone and prednisone. He continues Coumadin, current dose 5 mg daily except 2.5 mg on Wednesdays.  He complains of continued leg swelling. He reports recent adjustment of his diuretic regimen. He is concerned prednisone is contributing to the leg swelling and would like to discontinue it. He continues to have neuropathy symptoms involving the legs. He denies any bleeding.  Objective:  Vital signs in last 24 hours:  Blood pressure 151/58, pulse 65, temperature 97.7 F (36.5 C), temperature source Oral, resp. rate 20, height 5\' 10"  (1.778 m), weight 226 lb 1.6 oz (102.558 kg), SpO2 97 %.    HEENT: No thrush or ulcers. Lymphatics: No palpable cervical or supraclavicular lymph nodes. Soft fullness left lower neck. Resp: Lungs clear bilaterally. Cardio: Regular rate and rhythm. GI: Abdomen soft and nontender. No organomegaly. Vascular: Trace edema throughout both legs.   Lab Results:  Lab Results  Component Value Date   WBC 7.1 12/09/2015   HGB 10.5* 12/09/2015   HCT 32.2* 12/09/2015   MCV 97.3 12/09/2015   PLT 127* 12/09/2015   NEUTROABS 5.1 12/09/2015  PT 38.4/INR 3.2  Imaging:  No results found.  Medications: I have reviewed the patient's current medications.  Assessment/Plan: 1. Non-Hodgkin's lymphoma (follicular grade 3 lymphoma) - status post 6 cycles of Cytoxan/prednisone/rituximab 07/01/2007 through 10/21/2007. He remains in clinical remission. 2. CT chest 04/01/2015 with no lymphadenopathy  CT abdomen/pelvis 05/01/2015 with mild progression of abdomen/pelvic lymphadenopathy 3. Prostate cancer - he has been treated with hormonal therapy, the PSA was stable on 10/15/2014  PSA increased 04/26/2015   Bone scan 05/09/2015 with unchanged increased activity in the thoracic and lumbar spine  felt to most likely be degenerative.   Initiation of Abiraterone and prednisone 05/11/2015. Normalization of the PSA on Abiraterone. 4. History of a normocytic anemia secondary to non-Hodgkin's lymphoma, chemotherapy, and renal insufficiency - progressive secondary to GI bleeding, stable 5. History of Mild thrombocytopenia  6. History of bilateral hydronephrosis. Renal ultrasound 09/26/2014 consistent with medical renal disease. No hydronephrosis. 7. Coronary artery disease - status post coronary artery stent placement. 8. History of noncalcified lung nodules. 9. History of severe neutropenia July 2009 - likely related to delayed toxicity from rituximab. 10. Macular degeneration. 11. Right middle lobe density on a CT of the chest 06/18/2010 measuring 2.5 x 1.6 cm with mildly increased metabolic activity (SUV max 2.5) on a PET scan 06/27/2010. The area of increased density was less confluent and appeared smaller on a restaging CT 09/08/2010. Right middle lobe "scarring" with no new or suspicious pulmonary nodule on a CT 08/04/2011. 12. Mild increased metabolic activity associated with several lymph nodes on the PET scan 06/27/2010 - likely related to lymphoma. No lymphadenopathy in the mediastinum or axillary areas on the CT 08/04/2011. 13. Right hip discomfort-he received a steroid injection by his primary physician without improvement. He saw Dr. Collier Salina and there was a question of a lytic process in the right pelvis. A bone scan on 09/21/2012 revealed no evidence of metastatic disease. 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 Renal insufficiency 15. Left leg DVT and pulmonary embolism 04/01/2015-on Coumadin 16. Admission 08/23/2015 with severe anemia and GI bleeding  Upper and lower endoscopy 08/26/2015 without a clear source for bleeding identified   Disposition: [name] appears unchanged. He will continue Abiraterone. We will follow-up on  the PSA from  today. He would like to discontinue prednisone due to concern it is contributing to the leg edema. He was provided with a taper schedule of 2.5 mg daily for one week then 2.5 mg every other day for one week then stop.  The INR is above goal range at 3.2. He will hold today's dose and begin 2.5 mg on a Monday Wednesday Friday schedule, 5 mg all other days beginning 12/10/2015. He will return on 12/13/2015 for a repeat PT/INR.  He will return for a follow-up visit in one month. He will contact the office in the interim with any problems.  Plan reviewed with Dr. Benay Spice.  [name] ANP/GNP-BC   12/09/2015  2:18 PM

## 2015-12-10 LAB — PSA: PROSTATE SPECIFIC AG, SERUM: 0.2 ng/mL (ref 0.0–4.0)

## 2015-12-12 ENCOUNTER — Other Ambulatory Visit: Payer: Self-pay | Admitting: Oncology

## 2015-12-13 ENCOUNTER — Other Ambulatory Visit: Payer: Self-pay | Admitting: *Deleted

## 2015-12-13 ENCOUNTER — Other Ambulatory Visit (HOSPITAL_BASED_OUTPATIENT_CLINIC_OR_DEPARTMENT_OTHER): Payer: Medicare Other

## 2015-12-13 ENCOUNTER — Telehealth: Payer: Self-pay | Admitting: *Deleted

## 2015-12-13 DIAGNOSIS — I2699 Other pulmonary embolism without acute cor pulmonale: Secondary | ICD-10-CM | POA: Diagnosis not present

## 2015-12-13 DIAGNOSIS — Z7901 Long term (current) use of anticoagulants: Secondary | ICD-10-CM | POA: Diagnosis not present

## 2015-12-13 DIAGNOSIS — I82402 Acute embolism and thrombosis of unspecified deep veins of left lower extremity: Secondary | ICD-10-CM

## 2015-12-13 LAB — PROTIME-INR
INR: 2.2 (ref 2.00–3.50)
Protime: 26.4 Seconds — ABNORMAL HIGH (ref 10.6–13.4)

## 2015-12-13 NOTE — Telephone Encounter (Signed)
-----   Message from Owens Shark, NP sent at 12/13/2015  1:12 PM EDT ----- Continue same dose coumadin; repeat PT/INR in 1 week

## 2015-12-18 ENCOUNTER — Telehealth: Payer: Self-pay | Admitting: Oncology

## 2015-12-18 NOTE — Telephone Encounter (Signed)
pt dtr called to sched appt for friday...done...pt is not felling well and is out of town...i advised that she could call back for triage so he can be see. She just wanted to keep the appt for friday until he came back in town

## 2015-12-19 ENCOUNTER — Other Ambulatory Visit: Payer: Self-pay | Admitting: *Deleted

## 2015-12-19 ENCOUNTER — Encounter: Payer: Self-pay | Admitting: Nurse Practitioner

## 2015-12-19 ENCOUNTER — Telehealth: Payer: Self-pay | Admitting: *Deleted

## 2015-12-19 ENCOUNTER — Ambulatory Visit (HOSPITAL_BASED_OUTPATIENT_CLINIC_OR_DEPARTMENT_OTHER): Payer: Medicare Other | Admitting: Nurse Practitioner

## 2015-12-19 ENCOUNTER — Other Ambulatory Visit (HOSPITAL_BASED_OUTPATIENT_CLINIC_OR_DEPARTMENT_OTHER): Payer: Medicare Other

## 2015-12-19 ENCOUNTER — Telehealth: Payer: Self-pay | Admitting: Nurse Practitioner

## 2015-12-19 VITALS — BP 126/50 | HR 65 | Temp 97.5°F | Resp 17 | Ht 70.0 in | Wt 225.8 lb

## 2015-12-19 DIAGNOSIS — R5383 Other fatigue: Secondary | ICD-10-CM | POA: Diagnosis not present

## 2015-12-19 DIAGNOSIS — C61 Malignant neoplasm of prostate: Secondary | ICD-10-CM

## 2015-12-19 DIAGNOSIS — W19XXXA Unspecified fall, initial encounter: Secondary | ICD-10-CM

## 2015-12-19 DIAGNOSIS — E860 Dehydration: Secondary | ICD-10-CM | POA: Diagnosis not present

## 2015-12-19 DIAGNOSIS — I2699 Other pulmonary embolism without acute cor pulmonale: Secondary | ICD-10-CM

## 2015-12-19 DIAGNOSIS — E876 Hypokalemia: Secondary | ICD-10-CM

## 2015-12-19 DIAGNOSIS — I82402 Acute embolism and thrombosis of unspecified deep veins of left lower extremity: Secondary | ICD-10-CM | POA: Diagnosis present

## 2015-12-19 DIAGNOSIS — K625 Hemorrhage of anus and rectum: Secondary | ICD-10-CM | POA: Insufficient documentation

## 2015-12-19 DIAGNOSIS — R531 Weakness: Secondary | ICD-10-CM

## 2015-12-19 DIAGNOSIS — Z8572 Personal history of non-Hodgkin lymphomas: Secondary | ICD-10-CM

## 2015-12-19 DIAGNOSIS — R609 Edema, unspecified: Secondary | ICD-10-CM

## 2015-12-19 DIAGNOSIS — D500 Iron deficiency anemia secondary to blood loss (chronic): Secondary | ICD-10-CM

## 2015-12-19 DIAGNOSIS — Z7901 Long term (current) use of anticoagulants: Secondary | ICD-10-CM | POA: Insufficient documentation

## 2015-12-19 LAB — CBC WITH DIFFERENTIAL/PLATELET
BASO%: 0.4 % (ref 0.0–2.0)
BASOS ABS: 0 10*3/uL (ref 0.0–0.1)
EOS ABS: 0.1 10*3/uL (ref 0.0–0.5)
EOS%: 2.6 % (ref 0.0–7.0)
HEMATOCRIT: 31.3 % — AB (ref 38.4–49.9)
HEMOGLOBIN: 10.4 g/dL — AB (ref 13.0–17.1)
LYMPH#: 1.1 10*3/uL (ref 0.9–3.3)
LYMPH%: 20.9 % (ref 14.0–49.0)
MCH: 32.1 pg (ref 27.2–33.4)
MCHC: 33.2 g/dL (ref 32.0–36.0)
MCV: 96.6 fL (ref 79.3–98.0)
MONO#: 0.5 10*3/uL (ref 0.1–0.9)
MONO%: 8.9 % (ref 0.0–14.0)
NEUT#: 3.6 10*3/uL (ref 1.5–6.5)
NEUT%: 67.2 % (ref 39.0–75.0)
PLATELETS: 118 10*3/uL — AB (ref 140–400)
RBC: 3.24 10*6/uL — ABNORMAL LOW (ref 4.20–5.82)
RDW: 13.5 % (ref 11.0–14.6)
WBC: 5.3 10*3/uL (ref 4.0–10.3)

## 2015-12-19 LAB — COMPREHENSIVE METABOLIC PANEL
ALBUMIN: 3.7 g/dL (ref 3.5–5.0)
ALK PHOS: 66 U/L (ref 40–150)
ALT: 12 U/L (ref 0–55)
ANION GAP: 11 meq/L (ref 3–11)
AST: 20 U/L (ref 5–34)
BUN: 35 mg/dL — AB (ref 7.0–26.0)
CALCIUM: 10.2 mg/dL (ref 8.4–10.4)
CHLORIDE: 104 meq/L (ref 98–109)
CO2: 29 mEq/L (ref 22–29)
Creatinine: 1.7 mg/dL — ABNORMAL HIGH (ref 0.7–1.3)
EGFR: 35 mL/min/{1.73_m2} — AB (ref >90)
Glucose: 129 mg/dl (ref 70–140)
POTASSIUM: 3.4 meq/L — AB (ref 3.5–5.1)
Sodium: 144 mEq/L (ref 136–145)
Total Bilirubin: 0.66 mg/dL (ref 0.20–1.20)
Total Protein: 6.4 g/dL (ref 6.4–8.3)

## 2015-12-19 LAB — PROTIME-INR
INR: 2.4 (ref 2.00–3.50)
PROTIME: 28.8 s — AB (ref 10.6–13.4)

## 2015-12-19 NOTE — Assessment & Plan Note (Signed)
Patient is taking the Zytiga oral therapy as directed.  Patient also has a history of non-Hodgkin's lymphoma which is currently undergoing observation only.  Since patient underwent labs and a visit here at the [facility] today; will cancel all appointments that were previously scheduled for tomorrow, 12/20/2015.  Patient will return for labs and a visit with Dr. Benay Spice on 01/06/2016.  They know to call in the interim with any new worries or concerns.

## 2015-12-19 NOTE — Telephone Encounter (Signed)
Call from patient's daughter reporting "Dad returned yesterday from the beach after feeling dizzy, weak, fall with no injury, he's on coumadin, stools smell funny & may have had some blood, he has a history of GI bleed.  Scheduled to see [name] tomorrow but I think he needs to be seen today."  Dr.Sherrill notified of these events.  Verbal order received and read back for [facility] visit this morning with labs for PT/INR, CBC-diff, CMET and type and hold for blood.  Patient's daughter says they can possibly get here within an hour.  P.OF. Generated.

## 2015-12-19 NOTE — Telephone Encounter (Signed)
Added apt per pof .Marland Kitchen Pt aware

## 2015-12-19 NOTE — Assessment & Plan Note (Signed)
Patient has a history of both DVT and pulmonary embolism in the past; and takes Coumadin as directed.  INR today was 2.4.

## 2015-12-19 NOTE — Assessment & Plan Note (Signed)
Patient has a history of chronic bilateral lower extremity peripheral edema; and typically takes Lasix diuretic.  However, patient states he has been holding the Lasix for the past several days since he has been on vacation and didn't want to have to go the bathroom too often.  Patient denies any worsening shortness of breath today.  Exam today reveals +2 edema to bilateral lower extremities; the tenderness.  Patient states that his leg edema is actually at his baseline now and has not increased.  Patient was advised to start taking his Lasix again as directed.  Also, patient has been attempting to taper his prednisone; stating that he felt that the prednisone was contributing to his chronic lower extremity edema.

## 2015-12-19 NOTE — Assessment & Plan Note (Signed)
Patient reports that he just returned from a vacation to the beach last night.  While at the beach-patient felt increasingly fatigued and weak.  He also states he has had decreased oral intake and feels slightly dehydrated.  Patient states that he fell while in the bathroom at the beach.  He states that the commode was very low; and it was difficult for him to get up by himself.  He states that he didn't actually fall; but slid down the wall.  He denies hitting his head or any loss of consciousness.  He denies any known injury or trauma.  No evidence of injury or trauma on exam.  Patient does have a scabbed area to the right outer lower leg but appears to have calmed from patient scratching.  No evidence of infection.

## 2015-12-19 NOTE — Progress Notes (Signed)
SYMPTOM MANAGEMENT CLINIC    Chief Complaint: Fatigue, weakness  HPI:  Edward Woodward 82 y.o. male diagnosed with prostate cancer and remote history non-Hodgkin's lymphoma.  Currently undergoing Zytiga oral therapy.  Patient presented to the [facility] today with complaint of increased fatigue and weakness.  Patient states that he had some episodes of dizziness and had a fall in the bathroom while on vacation to the beach.  Patient also reports that he had some foul-smelling stool; and there was some trace bright red blood in the stool.  Patient states that his chronic bilateral lower extremity edema is at baseline; even though he has been holding the Lasix diuretic while on vacation.  Patient denies any recent fevers or chills.    No history exists.    Review of Systems  Constitutional: Positive for malaise/fatigue.  Cardiovascular: Positive for leg swelling.  Gastrointestinal: Positive for blood in stool.  Neurological: Positive for dizziness and weakness.  All other systems reviewed and are negative.   Past Medical History  Diagnosis Date  . Prostate cancer ([place])   . Neuropathy ([place])   . Hypercholesteremia   . Depressive disorder, not elsewhere classified   . Sinoatrial node dysfunction (HCC)   . Degeneration of lumbar or lumbosacral intervertebral disc   . CAD (coronary artery disease)      NYHA Class 1B, CABG 1985  . HTN (hypertension)   . OSA on CPAP   . Collagen vascular disease ([place])   . PE (pulmonary embolism) 03/2015  . Neuromuscular disorder (HCC)     neuropathy  . GERD (gastroesophageal reflux disease)   . Presence of permanent cardiac pacemaker     Past Surgical History  Procedure Laterality Date  . Coronary artery bypass graft    . Exploratory laparotomy    . Endarterectomy    . Rotator cuff repair    . Lumbar fusion    . Coronary angioplasty with stent placement      s/p bare metal stent implant SVG-diagonal 11/23/05, s/p BM stent  implantation SVG diagonal 10/22/06 (feeds LAD), and 05/2010 with DES to left circumflex  . Pacemaker insertion    . Esophagogastroduodenoscopy (egd) with propofol N/A 08/26/2015    Procedure: ESOPHAGOGASTRODUODENOSCOPY (EGD) WITH PROPOFOL;  Surgeon: Clarene Essex, MD;  Location[facility] ENDOSCOPY;  Service: Endoscopy;  Laterality: N/A;  . Colonoscopy N/A 08/26/2015    Procedure: COLONOSCOPY;  Surgeon: Clarene Essex, MD;  Location: [facility] ENDOSCOPY;  Service: Endoscopy;  Laterality: N/A;  . Cardiac catheterization N/A 08/27/2015    Procedure: Left Heart Cath and Cors/Grafts Angiography;  Surgeon: Belva Crome, MD;  Location: [facility] CV LAB;  Service: Cardiovascular;  Laterality: N/A;    has Other malignant lymphomas, unspecified site, extranodal and solid organ sites; Peripheral vascular disease ([place]); Atherosclerosis of coronary artery bypass graft of native heart without angina pectoris; Hyperlipidemia; Essential hypertension, benign; Dyspnea; OSA on CPAP; Fall; Sinus node dysfunction chronotropic incompetence; Cardiac pacemaker -[employer]; Atrial fibrillation ([place]); Pulmonary embolus ([place]); CKD (chronic kidney disease), stage III; Chronic anemia; Acute respiratory failure with hypoxia ([place]); Encounter for chemotherapy management; Chest pain; Chronic diastolic heart failure ([place]); Pain in the chest; NSTEMI (non-ST elevated myocardial infarction) ([place]); Iron deficiency anemia due to chronic blood loss; Dehydration; Long term current use of anticoagulant therapy; Hypokalemia; Peripheral edema; Rectal bleeding; and Prostate cancer ([place]) on his problem list.    is allergic to penicillins and simvastatin.    Medication List       This list is  accurate as of: 12/19/15 10:32 PM.  Always use your most recent med list.               abiraterone Acetate 250 MG tablet  Commonly known as:  ZYTIGA  Take 4 tablets (1,000 mg total) by mouth daily. Take on an empty stomach 1 hour before or 2 hours after a meal       atorvastatin 40 MG tablet  Commonly known as:  LIPITOR  Take 1 tablet (40 mg total) by mouth every other day.     citalopram 20 MG tablet  Commonly known as:  CELEXA  Take 20 mg by mouth daily.     folic acid 1 MG tablet  Commonly known as:  FOLVITE  Take 1 tablet (1 mg total) by mouth daily.     gabapentin 300 MG capsule  Commonly known as:  NEURONTIN  Take 300 mg by mouth 2 (two) times daily. not to exceed 3 times daily     isosorbide mononitrate 30 MG 24 hr tablet  Commonly known as:  IMDUR  Take 1 tablet (30 mg total) by mouth daily.     Melatonin 3 MG Tabs  Take 3 mg by mouth at bedtime.     metoprolol succinate 50 MG 24 hr tablet  Commonly known as:  TOPROL-XL  Take 1 tablet (50 mg total) by mouth daily. Take with or immediately following a meal.     multivitamin capsule  Take 1 capsule by mouth daily.     nitroGLYCERIN 0.4 MG/SPRAY spray  Commonly known as:  NITROLINGUAL  Place 1 spray under the tongue every 5 (five) minutes x 3 doses as needed for chest pain.     pantoprazole 40 MG tablet  Commonly known as:  PROTONIX  Take 1 tablet (40 mg total) by mouth 2 (two) times daily before a meal.     predniSONE 5 MG tablet  Commonly known as:  DELTASONE  Take 5 mg by mouth daily with breakfast.     SYSTANE 0.4-0.3 % Soln  Generic drug:  Polyethyl Glycol-Propyl Glycol  Place 1 drop into both eyes at bedtime.     torsemide 20 MG tablet  Commonly known as:  DEMADEX  Take 1 tablet (20 mg total) by mouth 2 (two) times daily.     UBIQUINOL PO  Take 1 tablet by mouth daily. CoQ10     Vitamin B-12 CR 1000 MCG Tbcr  Take 1 tablet by mouth daily.     Vitamin D3 2000 units Tabs  Take 2 tablets by mouth daily.     warfarin 5 MG tablet  Commonly known as:  COUMADIN  Take 1 tablet (5 mg total) by mouth daily at 6 PM. Except every MWF take half tablet (2.5 mg)         PHYSICAL EXAMINATION  Oncology Vitals 12/19/2015 12/09/2015  Height 178 cm 178 cm  Weight  102.422 kg 102.558 kg  Weight (lbs) 225 lbs 13 oz 226 lbs 2 oz  BMI (kg/m2) 32.4 kg/m2 32.44 kg/m2  Temp 97.5 97.7  Pulse 65 65  Resp 17 20  SpO2 97 97  BSA (m2) 2.25 m2 2.25 m2   BP Readings from Last 2 Encounters:  12/19/15 126/50  12/09/15 151/58    Physical Exam  Constitutional: He is oriented to person, place, and time. Vital signs are normal. He appears dehydrated. He appears unhealthy.  HENT:  Head: Normocephalic and atraumatic.  Eyes: Conjunctivae and EOM are normal. Pupils are  equal, round, and reactive to light. Right eye exhibits no discharge. Left eye exhibits no discharge. No scleral icterus.  Neck: Normal range of motion.  Pulmonary/Chest: Effort normal. No respiratory distress.  Abdominal: Soft. He exhibits no distension. There is no tenderness.  Musculoskeletal: Normal range of motion. He exhibits edema. He exhibits no tenderness.  +2 edema to bilateral lower extremities.  No calf tenderness.  Neurological: He is alert and oriented to person, place, and time.  Patient  Skin: Skin is warm and dry. No rash noted. No erythema. There is pallor.  Psychiatric: Affect normal.  Nursing note and vitals reviewed.   LABORATORY DATA:. Appointment on 12/19/2015  Component Date Value Ref Range Status  . WBC 12/19/2015 5.3  4.0 - 10.3 10e3/uL Final  . NEUT# 12/19/2015 3.6  1.5 - 6.5 10e3/uL Final  . HGB 12/19/2015 10.4* 13.0 - 17.1 g/dL Final  . HCT 12/19/2015 31.3* 38.4 - 49.9 % Final  . Platelets 12/19/2015 118* 140 - 400 10e3/uL Final  . MCV 12/19/2015 96.6  79.3 - 98.0 fL Final  . MCH 12/19/2015 32.1  27.2 - 33.4 pg Final  . MCHC 12/19/2015 33.2  32.0 - 36.0 g/dL Final  . RBC 12/19/2015 3.24* 4.20 - 5.82 10e6/uL Final  . RDW 12/19/2015 13.5  11.0 - 14.6 % Final  . lymph# 12/19/2015 1.1  0.9 - 3.3 10e3/uL Final  . MONO# 12/19/2015 0.5  0.1 - 0.9 10e3/uL Final  . Eosinophils Absolute 12/19/2015 0.1  0.0 - 0.5 10e3/uL Final  . Basophils Absolute 12/19/2015 0.0  0.0 -  0.1 10e3/uL Final  . NEUT% 12/19/2015 67.2  39.0 - 75.0 % Final  . LYMPH% 12/19/2015 20.9  14.0 - 49.0 % Final  . MONO% 12/19/2015 8.9  0.0 - 14.0 % Final  . EOS% 12/19/2015 2.6  0.0 - 7.0 % Final  . BASO% 12/19/2015 0.4  0.0 - 2.0 % Final  . Sodium 12/19/2015 144  136 - 145 mEq/L Final  . Potassium 12/19/2015 3.4* 3.5 - 5.1 mEq/L Final  . Chloride 12/19/2015 104  98 - 109 mEq/L Final  . CO2 12/19/2015 29  22 - 29 mEq/L Final  . Glucose 12/19/2015 129  70 - 140 mg/dl Final   Glucose reference range is for nonfasting patients. Fasting glucose reference range is 70- 100.  Marland Kitchen BUN 12/19/2015 35.0* 7.0 - 26.0 mg/dL Final  . Creatinine 12/19/2015 1.7* 0.7 - 1.3 mg/dL Final  . Total Bilirubin 12/19/2015 0.66  0.20 - 1.20 mg/dL Final  . Alkaline Phosphatase 12/19/2015 66  40 - 150 U/L Final  . AST 12/19/2015 20  5 - 34 U/L Final  . ALT 12/19/2015 12  0 - 55 U/L Final  . Total Protein 12/19/2015 6.4  6.4 - 8.3 g/dL Final  . Albumin 12/19/2015 3.7  3.5 - 5.0 g/dL Final  . Calcium 12/19/2015 10.2  8.4 - 10.4 mg/dL Final  . Anion Gap 12/19/2015 11  3 - 11 mEq/L Final  . EGFR 12/19/2015 35* >90 ml/min/1.73 m2 Final   eGFR is calculated using the CKD-EPI Creatinine Equation (2009)  . Protime 12/19/2015 28.8* 10.6 - 13.4 Seconds Final  . INR 12/19/2015 2.40  2.00 - 3.50 Final   Comment: INR is useful only to assess adequacy of anticoagulation with coumadin when comparing results from different labs. It should not be used to estimate bleeding risk or presence/abscense of coagulopathy in patients not on coumadin. Expected INR ranges for  nontherapeutic patients is 0.88 - 1.12.   Marland Kitchen  Lovenox 12/19/2015 No   Final    RADIOGRAPHIC STUDIES: No results found.  ASSESSMENT/PLAN:    Fall Patient reports that he just returned from a vacation to the beach last night.  While at the beach-patient felt increasingly fatigued and weak.  He also states he has had decreased oral intake and feels slightly dehydrated.   Patient states that he fell while in the bathroom at the beach.  He states that the commode was very low; and it was difficult for him to get up by himself.  He states that he didn't actually fall; but slid down the wall.  He denies hitting his head or any loss of consciousness.  He denies any known injury or trauma.  No evidence of injury or trauma on exam.  Patient does have a scabbed area to the right outer lower leg but appears to have calmed from patient scratching.  No evidence of infection.  Dehydration Patient states that he has had decreased appetite and less oral intake recently.  He has felt increasingly fatigued and weak as well.  Patient does appear mildly dehydrated.  He has a history of chronic renal insufficiency and creatinine was 1.7 as his baseline today.  Patient typically takes Lasix diuretic; but has been holding his Lasix while on vacation to avoid going to the bathroom too often.  Patient was advised to push fluids is much as possible; but will forego IV fluid rehydration.  Since patient has been prescribed diuretics in the past.  Long term current use of anticoagulant therapy Patient has a history of both DVT and pulmonary embolism in the past; and takes Coumadin as directed.  INR today was 2.4.  Hypokalemia Potassium was 3.4 today.  Patient was advised to push potassium in her his diet is much as possible.  Will continue to monitor closely.  Peripheral edema Patient has a history of chronic bilateral lower extremity peripheral edema; and typically takes Lasix diuretic.  However, patient states he has been holding the Lasix for the past several days since he has been on vacation and didn't want to have to go the bathroom too often.  Patient denies any worsening shortness of breath today.  Exam today reveals +2 edema to bilateral lower extremities; the tenderness.  Patient states that his leg edema is actually at his baseline now and has not increased.  Patient was  advised to start taking his Lasix again as directed.  Also, patient has been attempting to taper his prednisone; stating that he felt that the prednisone was contributing to his chronic lower extremity edema.  Rectal bleeding Patient's family member states the patient had a bowel movement earlier this week that was foul-smelling and had some bright red blood in it.  Patient denies knowledge of having any internal or external hemorrhoids; but states that he does suffer with some constipation at times.  Labs obtained today reveal hemoglobin stable, and platelet count 118.  Reviewed all findings with Dr. Benay Spice; he advised to have patient monitor for any continued rectal bleeding issues.  Prostate cancer [facility]) Patient is taking the Zytiga oral therapy as directed.  Patient also has a history of non-Hodgkin's lymphoma which is currently undergoing observation only.  Since patient underwent labs and a visit here at the [facility] today; will cancel all appointments that were previously scheduled for tomorrow, 12/20/2015.  Patient will return for labs and a visit with Dr. Benay Spice on 01/06/2016.  They know to call in the interim with any new worries  or concerns.   Patient stated understanding of all instructions; and was in agreement with this plan of care. The patient knows to call the clinic with any problems, questions or concerns.   Total time spent with patient was 25 minutes;  with greater than 75 percent of that time spent in face to face counseling regarding patient's symptoms,  and coordination of care and follow up.  Disclaimer:This dictation was prepared with Dragon/digital dictation along with [employer]. Any transcriptional errors that result from this process are unintentional.  [name], NP 12/19/2015

## 2015-12-19 NOTE — Assessment & Plan Note (Signed)
Patient states that he has had decreased appetite and less oral intake recently.  He has felt increasingly fatigued and weak as well.  Patient does appear mildly dehydrated.  He has a history of chronic renal insufficiency and creatinine was 1.7 as his baseline today.  Patient typically takes Lasix diuretic; but has been holding his Lasix while on vacation to avoid going to the bathroom too often.  Patient was advised to push fluids is much as possible; but will forego IV fluid rehydration.  Since patient has been prescribed diuretics in the past.

## 2015-12-19 NOTE — Assessment & Plan Note (Signed)
Patient's family member states the patient had a bowel movement earlier this week that was foul-smelling and had some bright red blood in it.  Patient denies knowledge of having any internal or external hemorrhoids; but states that he does suffer with some constipation at times.  Labs obtained today reveal hemoglobin stable, and platelet count 118.  Reviewed all findings with Dr. Benay Spice; he advised to have patient monitor for any continued rectal bleeding issues.

## 2015-12-19 NOTE — Assessment & Plan Note (Signed)
Potassium was 3.4 today.  Patient was advised to push potassium in her his diet is much as possible.  Will continue to monitor closely.

## 2015-12-20 ENCOUNTER — Other Ambulatory Visit: Payer: Medicare Other

## 2015-12-20 ENCOUNTER — Ambulatory Visit: Payer: Medicare Other | Admitting: Nurse Practitioner

## 2015-12-21 ENCOUNTER — Other Ambulatory Visit: Payer: Self-pay | Admitting: Oncology

## 2015-12-24 ENCOUNTER — Telehealth: Payer: Self-pay | Admitting: *Deleted

## 2015-12-24 NOTE — Telephone Encounter (Signed)
Voicemail: "He saw Ms.Berniece Salines last Thursday.  Had serious diarrhea .  To touch right stomach, a sharp pain to touch and this is new.  I thought this was from the prednisone.  We stopped tapering and I now give a half a pill daily.  This helped with controlling/clearing up the diarrhea.  He fell and could not assist himself up. It took three of [place] to get him up.  Is something else going on?  Does he need to be seen?  [phone]"    Returned call to spouse who reports giving "two imodium over the weekend which helped.  He fell on the deck this weekend.  He's so weak he crawled to the door to take dog out to use bathroom.  Now he's gotten his strength back, walking with a rollator.   The diarrhea is gone but has pain in right abdominal area. He jumps if I press or touch his abdomen.  Bloating pretty bad is why we were going to try to come off the prednisone. Stomach is a little bloated but not as bad as before.  No nausea or vomiting.  Has not tried anything for pain.  Napping with this call so unable to assess with pain scale.  Eats ice cream.  Decreased appetite but trying to eat and drink."   01-06-2016 is next scheduled F/U with Dr. Benay Spice.

## 2015-12-24 NOTE — Telephone Encounter (Signed)
Discussed call with Dr. Benay Spice: Does not sound like this is related to pt's cancer. Have pt see PCP to be evaluated. Returned call to Mrs. Arnette with instructions to contact PCP, she agreed to do so.  They think pt began having Zytiga side effects since tapering off Prednisone as discussed at 5/15 office visit. She stated pt will continue Prednisone 2.5 mg daily until next office visit here.

## 2015-12-25 ENCOUNTER — Telehealth: Payer: Self-pay | Admitting: *Deleted

## 2015-12-25 NOTE — Telephone Encounter (Signed)
Left message on voicemail for pt to call office with an update. Will work pt in late afternoon 6/1 or on 6/2 if he isn't feeling any better.

## 2015-12-26 ENCOUNTER — Other Ambulatory Visit: Payer: Self-pay | Admitting: *Deleted

## 2015-12-26 ENCOUNTER — Encounter: Payer: Self-pay | Admitting: *Deleted

## 2015-12-26 DIAGNOSIS — D696 Thrombocytopenia, unspecified: Secondary | ICD-10-CM | POA: Diagnosis not present

## 2015-12-26 DIAGNOSIS — I129 Hypertensive chronic kidney disease with stage 1 through stage 4 chronic kidney disease, or unspecified chronic kidney disease: Secondary | ICD-10-CM | POA: Diagnosis not present

## 2015-12-26 DIAGNOSIS — N183 Chronic kidney disease, stage 3 (moderate): Secondary | ICD-10-CM | POA: Diagnosis not present

## 2015-12-26 DIAGNOSIS — R269 Unspecified abnormalities of gait and mobility: Secondary | ICD-10-CM | POA: Diagnosis not present

## 2015-12-26 DIAGNOSIS — R1031 Right lower quadrant pain: Secondary | ICD-10-CM | POA: Diagnosis not present

## 2015-12-26 MED ORDER — PREDNISONE 5 MG PO TABS: ORAL_TABLET | ORAL | Status: DC

## 2015-12-26 NOTE — Progress Notes (Signed)
Call placed to check on pt and spoke with pt.'s wife.  Pt.'s wife states that pt had an appt with Dr. Felipa Eth this morning and is a "little stronger" today.  She requested that I inform Dr. Benay Spice that pt is continuing to take Prednisone 2.5 mg PO daily.  Dr. Benay Spice informed of above information.

## 2015-12-30 DIAGNOSIS — D3612 Benign neoplasm of peripheral nerves and autonomic nervous system, upper limb, including shoulder: Secondary | ICD-10-CM | POA: Diagnosis not present

## 2015-12-30 DIAGNOSIS — L570 Actinic keratosis: Secondary | ICD-10-CM | POA: Diagnosis not present

## 2015-12-30 DIAGNOSIS — D692 Other nonthrombocytopenic purpura: Secondary | ICD-10-CM | POA: Diagnosis not present

## 2015-12-30 DIAGNOSIS — Z85828 Personal history of other malignant neoplasm of skin: Secondary | ICD-10-CM | POA: Diagnosis not present

## 2015-12-30 DIAGNOSIS — L812 Freckles: Secondary | ICD-10-CM | POA: Diagnosis not present

## 2015-12-30 DIAGNOSIS — L821 Other seborrheic keratosis: Secondary | ICD-10-CM | POA: Diagnosis not present

## 2016-01-06 ENCOUNTER — Telehealth: Payer: Self-pay | Admitting: Oncology

## 2016-01-06 ENCOUNTER — Other Ambulatory Visit: Payer: Self-pay | Admitting: *Deleted

## 2016-01-06 ENCOUNTER — Telehealth: Payer: Self-pay | Admitting: *Deleted

## 2016-01-06 ENCOUNTER — Other Ambulatory Visit (HOSPITAL_BASED_OUTPATIENT_CLINIC_OR_DEPARTMENT_OTHER): Payer: Medicare Other

## 2016-01-06 ENCOUNTER — Ambulatory Visit (HOSPITAL_BASED_OUTPATIENT_CLINIC_OR_DEPARTMENT_OTHER): Payer: Medicare Other | Admitting: Oncology

## 2016-01-06 VITALS — BP 132/51 | HR 65 | Temp 98.3°F | Resp 17 | Ht 70.0 in | Wt 231.4 lb

## 2016-01-06 DIAGNOSIS — N19 Unspecified kidney failure: Secondary | ICD-10-CM | POA: Diagnosis not present

## 2016-01-06 DIAGNOSIS — C61 Malignant neoplasm of prostate: Secondary | ICD-10-CM | POA: Diagnosis not present

## 2016-01-06 DIAGNOSIS — Z8572 Personal history of non-Hodgkin lymphomas: Secondary | ICD-10-CM

## 2016-01-06 DIAGNOSIS — I509 Heart failure, unspecified: Secondary | ICD-10-CM

## 2016-01-06 DIAGNOSIS — R609 Edema, unspecified: Secondary | ICD-10-CM

## 2016-01-06 DIAGNOSIS — D649 Anemia, unspecified: Secondary | ICD-10-CM

## 2016-01-06 DIAGNOSIS — I2699 Other pulmonary embolism without acute cor pulmonale: Secondary | ICD-10-CM

## 2016-01-06 DIAGNOSIS — Z8546 Personal history of malignant neoplasm of prostate: Secondary | ICD-10-CM | POA: Diagnosis not present

## 2016-01-06 DIAGNOSIS — Z7901 Long term (current) use of anticoagulants: Secondary | ICD-10-CM

## 2016-01-06 DIAGNOSIS — Z86718 Personal history of other venous thrombosis and embolism: Secondary | ICD-10-CM

## 2016-01-06 DIAGNOSIS — I2782 Chronic pulmonary embolism: Secondary | ICD-10-CM

## 2016-01-06 LAB — BASIC METABOLIC PANEL
ANION GAP: 10 meq/L (ref 3–11)
BUN: 39.1 mg/dL — ABNORMAL HIGH (ref 7.0–26.0)
CALCIUM: 9.7 mg/dL (ref 8.4–10.4)
CO2: 30 mEq/L — ABNORMAL HIGH (ref 22–29)
Chloride: 102 mEq/L (ref 98–109)
Creatinine: 2.1 mg/dL — ABNORMAL HIGH (ref 0.7–1.3)
EGFR: 27 mL/min/{1.73_m2} — AB (ref >90)
Glucose: 137 mg/dl (ref 70–140)
Potassium: 3.8 mEq/L (ref 3.5–5.1)
SODIUM: 142 meq/L (ref 136–145)

## 2016-01-06 LAB — CBC WITH DIFFERENTIAL/PLATELET
BASO%: 0.4 % (ref 0.0–2.0)
Basophils Absolute: 0 10*3/uL (ref 0.0–0.1)
EOS ABS: 0.1 10*3/uL (ref 0.0–0.5)
EOS%: 1.9 % (ref 0.0–7.0)
HCT: 31.2 % — ABNORMAL LOW (ref 38.4–49.9)
HEMOGLOBIN: 10.4 g/dL — AB (ref 13.0–17.1)
LYMPH%: 17 % (ref 14.0–49.0)
MCH: 31.4 pg (ref 27.2–33.4)
MCHC: 33.3 g/dL (ref 32.0–36.0)
MCV: 94.3 fL (ref 79.3–98.0)
MONO#: 0.5 10*3/uL (ref 0.1–0.9)
MONO%: 8.1 % (ref 0.0–14.0)
NEUT%: 72.6 % (ref 39.0–75.0)
NEUTROS ABS: 4.5 10*3/uL (ref 1.5–6.5)
Platelets: 139 10*3/uL — ABNORMAL LOW (ref 140–400)
RBC: 3.31 10*6/uL — ABNORMAL LOW (ref 4.20–5.82)
RDW: 14.2 % (ref 11.0–14.6)
WBC: 6.3 10*3/uL (ref 4.0–10.3)
lymph#: 1.1 10*3/uL (ref 0.9–3.3)

## 2016-01-06 LAB — PROTIME-INR
INR: 3.2 (ref 2.00–3.50)
PROTIME: 38.4 s — AB (ref 10.6–13.4)

## 2016-01-06 MED ORDER — METOLAZONE 2.5 MG PO TABS: 2.5000 mg | ORAL_TABLET | Freq: Every day | ORAL | Status: DC

## 2016-01-06 MED ORDER — ABIRATERONE ACETATE 250 MG PO TABS: 1000.0000 mg | ORAL_TABLET | Freq: Every day | ORAL | Status: DC

## 2016-01-06 MED FILL — ZYTIGA 250 MG TABLET: 250 | 30 days supply | Qty: 120 | Fill #3

## 2016-01-06 NOTE — Progress Notes (Signed)
[facility] OFFICE PROGRESS NOTE   Diagnosis: Prostate cancer  INTERVAL HISTORY:   Mr. Regalbuto returns as scheduled. He complains of increased "weakness "for the past several weeks. He has experienced several falls at home. His wife had to call EMS to get him up on the floor. He believes the falls are secondary to motor weakness as opposed to neuropathy or syncope. He had an episode of diarrhea when he returned from the beach a few weeks ago. He reports intermittent low abdominal pain. No rectal bleeding.  He has persistent leg edema despite taking Demadex.  Objective:  Vital signs in last 24 hours:  Blood pressure 132/51, pulse 65, temperature 98.3 F (36.8 C), temperature source Oral, resp. rate 17, height 5\' 10"  (1.778 m), weight 231 lb 6.4 oz (104.962 kg), SpO2 97 %.    HEENT: Neck without mass Lymphatics: No cervical, supra-clavicular, axillary, or inguinal nodes Resp: Lungs clear bilaterally Cardio: Regular rate and rhythm, 2/6 systolic murmur GI: No hepatosplenomegaly, no mass, nontender Vascular: Pitting edema throughout the legs bilaterally including edema at the upper legs and waistline Neuro: Alert and oriented. The motor exam appears intact in the upper and lower extremities with 4/5 strength with proximal flexion at the hips  Skin: Chronic stasis change at the pretibial area bilaterally     Lab Results:  Lab Results  Component Value Date   WBC 6.3 01/06/2016   HGB 10.4* 01/06/2016   HCT 31.2* 01/06/2016   MCV 94.3 01/06/2016   PLT 139* 01/06/2016   NEUTROABS 4.5 01/06/2016  Potassium 3.8, creatinine 2.1, BUN 39.1, calcium 9.7  PT-INR 3.2  PSA 0.2 on 12/09/2011   Medications: I have reviewed the patient's current medications.  Assessment/Plan: 1.  Non-Hodgkin's lymphoma (follicular grade 3 lymphoma) - status post 6 cycles of Cytoxan/prednisone/rituximab 07/01/2007 through 10/21/2007. He remains in clinical remission. 2. CT chest  04/01/2015 with no lymphadenopathy  CT abdomen/pelvis 05/01/2015 with mild progression of abdomen/pelvic lymphadenopathy 3. Prostate cancer - he has been treated with hormonal therapy, the PSA was stable on 10/15/2014  PSA increased 04/26/2015   Bone scan 05/09/2015 with unchanged increased activity in the thoracic and lumbar spine felt to most likely be degenerative.   Initiation of Abiraterone and prednisone 05/11/2015. Normalization of the PSA on Abiraterone. 4. History of a normocytic anemia secondary to non-Hodgkin's lymphoma, chemotherapy, and renal insufficiency - progressive secondary to GI bleeding, stable 5. History of Mild thrombocytopenia  6. History of bilateral hydronephrosis. Renal ultrasound 09/26/2014 consistent with medical renal disease. No hydronephrosis. 7. Coronary artery disease - status post coronary artery stent placement. 8. History of noncalcified lung nodules. 9. History of severe neutropenia July 2009 - likely related to delayed toxicity from rituximab. 10. Macular degeneration. 11. Right middle lobe density on a CT of the chest 06/18/2010 measuring 2.5 x 1.6 cm with mildly increased metabolic activity (SUV max 2.5) on a PET scan 06/27/2010. The area of increased density was less confluent and appeared smaller on a restaging CT 09/08/2010. Right middle lobe "scarring" with no new or suspicious pulmonary nodule on a CT 08/04/2011. 12. Mild increased metabolic activity associated with several lymph nodes on the PET scan 06/27/2010 - likely related to lymphoma. No lymphadenopathy in the mediastinum or axillary areas on the CT 08/04/2011. 13. Right hip discomfort-he received a steroid injection by his primary physician without improvement. He saw Dr. Collier Salina and there was a question of a lytic process in the right pelvis. A bone scan on 09/21/2012 revealed  no evidence of metastatic disease. Bone scan 10/19/2013 consistent with osteoarthritis at the right  hip 14. Renal insufficiency 15. Left leg DVT and pulmonary embolism 04/01/2015-on Coumadin 16. Admission 08/23/2015 with severe anemia and GI bleeding  Upper and lower endoscopy 08/26/2015 without a clear source for bleeding identified 17. Leg edema/anasarca 18. Leg weakness    Disposition:  Mr. Orloff has a complex medical history including hormone refractory prostate cancer and a history of low-grade non-Hodgkin's lymphoma. There is no clinical evidence for progression of the prostate cancer or lymphoma.  I suspect the edema is related to heart disease, renal failure, and prednisone therapy. I discussed the case with Dr. Tamala Julian. He will continue prednisone at a dose of 2.5 mg daily. He will begin metolazone daily for 2 days and continue Demadex. He will contact Dr. Tamala Julian with an update on his edema later this week.  He will continue the current Coumadin dose. Mr. Cooperwood will return for an office and lab visit on 01/17/2016.  He plans to begin a physical therapy program. I discussed the potential need for placement with his wife and daughter. They would like to keep him at home for now.  [name], MD  01/06/2016  11:15 AM

## 2016-01-06 NOTE — Telephone Encounter (Signed)
Pt.'s wife notified that Dr. Benay Spice spoke with Dr. Linard Millers and that he recommends for patient to take Metolazone 2.5 mg 30 minutes prior to [facility] daily x 2 days, to weigh patient on Tuesday and Wednesday and to call Dr. Thompson Caul office on Thursday with patients weights.  Pt.'s wife verbalizes an understanding of information.  Teach back done.  Call placed to Dr. Carlyle Lipa office to f/u on [facility] PT referral that was placed by that office.  Patients wife states that they have not heard anything regarding referral at this time. Message left for Dr. Carlyle Lipa office to call [facility] back to confirm that PT referral has been placed.

## 2016-01-06 NOTE — Telephone Encounter (Signed)
per pof to sch pt appt-gave pt copyof avs-sent AMW emai to override GBS 30 min 6/23@12 :30

## 2016-01-06 NOTE — Telephone Encounter (Signed)
Per staff message and POF I have scheduled appts. Advised scheduler of appts. JMW  

## 2016-01-07 ENCOUNTER — Telehealth: Payer: Self-pay

## 2016-01-07 DIAGNOSIS — I5032 Chronic diastolic (congestive) heart failure: Secondary | ICD-10-CM

## 2016-01-07 LAB — PSA: Prostate Specific Ag, Serum: 0.1 ng/mL (ref 0.0–4.0)

## 2016-01-07 NOTE — Telephone Encounter (Signed)
-----   Message from Belva Crome, MD sent at 01/06/2016  2:13 PM EDT ----- Regarding: Fluid retention Lattie Haw, I instructed Dr. Jacinto Reap. Sherrill to start Mr. Isley on Metolazone 2.5 mg 30-60 minutes prior to AM Demadex x 2 days and then  Call with report on weight loss. He will need a BMET Thursday.

## 2016-01-07 NOTE — Telephone Encounter (Signed)
Pt aware of Dr.Smith's recommendation. Pt will come to the office on Thurs 6/15 for lab (Bmet) Pt sts that he has been weighing daily and will drop off a copy of his weights when he comes in for labs. Pt verbalized understanding to the instruction given.

## 2016-01-09 ENCOUNTER — Other Ambulatory Visit (INDEPENDENT_AMBULATORY_CARE_PROVIDER_SITE_OTHER): Payer: Medicare Other

## 2016-01-09 DIAGNOSIS — I5032 Chronic diastolic (congestive) heart failure: Secondary | ICD-10-CM

## 2016-01-09 NOTE — Addendum Note (Signed)
Addended by: Eulis Foster on: 01/09/2016 01:06 PM   Modules accepted: Orders

## 2016-01-10 ENCOUNTER — Other Ambulatory Visit: Payer: Self-pay | Admitting: *Deleted

## 2016-01-10 LAB — BASIC METABOLIC PANEL
BUN: 48 mg/dL — AB (ref 7–25)
CO2: 32 mmol/L — ABNORMAL HIGH (ref 20–31)
CREATININE: 2.23 mg/dL — AB (ref 0.70–1.11)
Calcium: 10 mg/dL (ref 8.6–10.3)
Chloride: 96 mmol/L — ABNORMAL LOW (ref 98–110)
GLUCOSE: 127 mg/dL — AB (ref 65–99)
POTASSIUM: 3.6 mmol/L (ref 3.5–5.3)
SODIUM: 140 mmol/L (ref 135–146)

## 2016-01-10 MED ORDER — METOLAZONE 2.5 MG PO TABS: 2.5000 mg | ORAL_TABLET | ORAL | Status: DC

## 2016-01-12 ENCOUNTER — Other Ambulatory Visit: Payer: Self-pay

## 2016-01-12 ENCOUNTER — Inpatient Hospital Stay (HOSPITAL_COMMUNITY)
Admission: EM | Admit: 2016-01-12 | Discharge: 2016-01-17 | DRG: 682 | Disposition: A | Discharge status: Skilled Nursing Facility | Payer: Medicare Other | Attending: Internal Medicine | Admitting: Internal Medicine

## 2016-01-12 ENCOUNTER — Emergency Department (HOSPITAL_COMMUNITY): Payer: Medicare Other

## 2016-01-12 ENCOUNTER — Encounter (HOSPITAL_COMMUNITY): Payer: Self-pay | Admitting: Nurse Practitioner

## 2016-01-12 DIAGNOSIS — Z7901 Long term (current) use of anticoagulants: Secondary | ICD-10-CM

## 2016-01-12 DIAGNOSIS — K713 Toxic liver disease with chronic persistent hepatitis: Secondary | ICD-10-CM

## 2016-01-12 DIAGNOSIS — N179 Acute kidney failure, unspecified: Principal | ICD-10-CM | POA: Diagnosis present

## 2016-01-12 DIAGNOSIS — D696 Thrombocytopenia, unspecified: Secondary | ICD-10-CM | POA: Diagnosis present

## 2016-01-12 DIAGNOSIS — I4510 Unspecified right bundle-branch block: Secondary | ICD-10-CM | POA: Diagnosis present

## 2016-01-12 DIAGNOSIS — R1909 Other intra-abdominal and pelvic swelling, mass and lump: Secondary | ICD-10-CM | POA: Diagnosis not present

## 2016-01-12 DIAGNOSIS — R278 Other lack of coordination: Secondary | ICD-10-CM

## 2016-01-12 DIAGNOSIS — G9340 Encephalopathy, unspecified: Secondary | ICD-10-CM | POA: Diagnosis not present

## 2016-01-12 DIAGNOSIS — Z86718 Personal history of other venous thrombosis and embolism: Secondary | ICD-10-CM

## 2016-01-12 DIAGNOSIS — I130 Hypertensive heart and chronic kidney disease with heart failure and stage 1 through stage 4 chronic kidney disease, or unspecified chronic kidney disease: Secondary | ICD-10-CM | POA: Diagnosis present

## 2016-01-12 DIAGNOSIS — C8590 Non-Hodgkin lymphoma, unspecified, unspecified site: Secondary | ICD-10-CM | POA: Diagnosis present

## 2016-01-12 DIAGNOSIS — Z7952 Long term (current) use of systemic steroids: Secondary | ICD-10-CM

## 2016-01-12 DIAGNOSIS — C61 Malignant neoplasm of prostate: Secondary | ICD-10-CM | POA: Diagnosis present

## 2016-01-12 DIAGNOSIS — G9341 Metabolic encephalopathy: Secondary | ICD-10-CM | POA: Diagnosis present

## 2016-01-12 DIAGNOSIS — Z79899 Other long term (current) drug therapy: Secondary | ICD-10-CM

## 2016-01-12 DIAGNOSIS — I480 Paroxysmal atrial fibrillation: Secondary | ICD-10-CM | POA: Diagnosis present

## 2016-01-12 DIAGNOSIS — F329 Major depressive disorder, single episode, unspecified: Secondary | ICD-10-CM | POA: Diagnosis present

## 2016-01-12 DIAGNOSIS — G4733 Obstructive sleep apnea (adult) (pediatric): Secondary | ICD-10-CM | POA: Diagnosis present

## 2016-01-12 DIAGNOSIS — T474X5A Adverse effect of other laxatives, initial encounter: Secondary | ICD-10-CM | POA: Diagnosis not present

## 2016-01-12 DIAGNOSIS — Z880 Allergy status to penicillin: Secondary | ICD-10-CM

## 2016-01-12 DIAGNOSIS — K521 Toxic gastroenteritis and colitis: Secondary | ICD-10-CM | POA: Diagnosis not present

## 2016-01-12 DIAGNOSIS — G252 Other specified forms of tremor: Secondary | ICD-10-CM | POA: Diagnosis present

## 2016-01-12 DIAGNOSIS — E876 Hypokalemia: Secondary | ICD-10-CM

## 2016-01-12 DIAGNOSIS — R531 Weakness: Secondary | ICD-10-CM | POA: Diagnosis not present

## 2016-01-12 DIAGNOSIS — I5032 Chronic diastolic (congestive) heart failure: Secondary | ICD-10-CM | POA: Diagnosis present

## 2016-01-12 DIAGNOSIS — R1900 Intra-abdominal and pelvic swelling, mass and lump, unspecified site: Secondary | ICD-10-CM

## 2016-01-12 DIAGNOSIS — R030 Elevated blood-pressure reading, without diagnosis of hypertension: Secondary | ICD-10-CM | POA: Diagnosis not present

## 2016-01-12 DIAGNOSIS — I4891 Unspecified atrial fibrillation: Secondary | ICD-10-CM | POA: Diagnosis present

## 2016-01-12 DIAGNOSIS — J029 Acute pharyngitis, unspecified: Secondary | ICD-10-CM | POA: Diagnosis present

## 2016-01-12 DIAGNOSIS — N183 Chronic kidney disease, stage 3 unspecified: Secondary | ICD-10-CM | POA: Diagnosis present

## 2016-01-12 DIAGNOSIS — D6489 Other specified anemias: Secondary | ICD-10-CM | POA: Diagnosis present

## 2016-01-12 DIAGNOSIS — N1330 Unspecified hydronephrosis: Secondary | ICD-10-CM

## 2016-01-12 DIAGNOSIS — D6481 Anemia due to antineoplastic chemotherapy: Secondary | ICD-10-CM | POA: Diagnosis present

## 2016-01-12 DIAGNOSIS — R4182 Altered mental status, unspecified: Secondary | ICD-10-CM | POA: Diagnosis not present

## 2016-01-12 DIAGNOSIS — Z86711 Personal history of pulmonary embolism: Secondary | ICD-10-CM

## 2016-01-12 DIAGNOSIS — G629 Polyneuropathy, unspecified: Secondary | ICD-10-CM | POA: Diagnosis present

## 2016-01-12 DIAGNOSIS — K219 Gastro-esophageal reflux disease without esophagitis: Secondary | ICD-10-CM | POA: Diagnosis present

## 2016-01-12 DIAGNOSIS — K8020 Calculus of gallbladder without cholecystitis without obstruction: Secondary | ICD-10-CM | POA: Diagnosis not present

## 2016-01-12 DIAGNOSIS — I10 Essential (primary) hypertension: Secondary | ICD-10-CM | POA: Diagnosis present

## 2016-01-12 DIAGNOSIS — T451X5A Adverse effect of antineoplastic and immunosuppressive drugs, initial encounter: Secondary | ICD-10-CM | POA: Diagnosis present

## 2016-01-12 DIAGNOSIS — I25810 Atherosclerosis of coronary artery bypass graft(s) without angina pectoris: Secondary | ICD-10-CM | POA: Diagnosis present

## 2016-01-12 DIAGNOSIS — W19XXXA Unspecified fall, initial encounter: Secondary | ICD-10-CM

## 2016-01-12 DIAGNOSIS — Z955 Presence of coronary angioplasty implant and graft: Secondary | ICD-10-CM

## 2016-01-12 DIAGNOSIS — Z9989 Dependence on other enabling machines and devices: Secondary | ICD-10-CM

## 2016-01-12 DIAGNOSIS — Z66 Do not resuscitate: Secondary | ICD-10-CM | POA: Diagnosis present

## 2016-01-12 DIAGNOSIS — R627 Adult failure to thrive: Secondary | ICD-10-CM | POA: Diagnosis present

## 2016-01-12 DIAGNOSIS — E7800 Pure hypercholesterolemia, unspecified: Secondary | ICD-10-CM | POA: Diagnosis present

## 2016-01-12 DIAGNOSIS — H3530 Unspecified macular degeneration: Secondary | ICD-10-CM | POA: Diagnosis present

## 2016-01-12 DIAGNOSIS — Z8249 Family history of ischemic heart disease and other diseases of the circulatory system: Secondary | ICD-10-CM

## 2016-01-12 DIAGNOSIS — Z9181 History of falling: Secondary | ICD-10-CM

## 2016-01-12 DIAGNOSIS — Z888 Allergy status to other drugs, medicaments and biological substances status: Secondary | ICD-10-CM

## 2016-01-12 DIAGNOSIS — R791 Abnormal coagulation profile: Secondary | ICD-10-CM

## 2016-01-12 DIAGNOSIS — R0902 Hypoxemia: Secondary | ICD-10-CM | POA: Diagnosis not present

## 2016-01-12 DIAGNOSIS — Z951 Presence of aortocoronary bypass graft: Secondary | ICD-10-CM

## 2016-01-12 DIAGNOSIS — Z515 Encounter for palliative care: Secondary | ICD-10-CM | POA: Insufficient documentation

## 2016-01-12 LAB — COMPREHENSIVE METABOLIC PANEL
ALBUMIN: 4 g/dL (ref 3.5–5.0)
ALK PHOS: 69 U/L (ref 38–126)
ALT: 14 U/L — AB (ref 17–63)
ANION GAP: 10 (ref 5–15)
AST: 22 U/L (ref 15–41)
BUN: 55 mg/dL — ABNORMAL HIGH (ref 6–20)
CALCIUM: 10 mg/dL (ref 8.9–10.3)
CO2: 35 mmol/L — AB (ref 22–32)
Chloride: 94 mmol/L — ABNORMAL LOW (ref 101–111)
Creatinine, Ser: 2.34 mg/dL — ABNORMAL HIGH (ref 0.61–1.24)
GFR calc Af Amer: 27 mL/min — ABNORMAL LOW (ref >60)
GFR calc non Af Amer: 24 mL/min — ABNORMAL LOW (ref >60)
GLUCOSE: 126 mg/dL — AB (ref 65–99)
Potassium: 2.7 mmol/L — CL (ref 3.5–5.1)
SODIUM: 139 mmol/L (ref 135–145)
Total Bilirubin: 0.9 mg/dL (ref 0.3–1.2)
Total Protein: 6.7 g/dL (ref 6.5–8.1)

## 2016-01-12 LAB — BLOOD GAS, ARTERIAL
ACID-BASE EXCESS: 9.9 mmol/L — AB (ref 0.0–2.0)
Bicarbonate: 34.8 mEq/L — ABNORMAL HIGH (ref 20.0–24.0)
DRAWN BY: 44956
FIO2: 0.21
O2 SAT: 88.3 %
Patient temperature: 98.4
TCO2: 31.9 mmol/L (ref 0–100)
pCO2 arterial: 49.3 mmHg — ABNORMAL HIGH (ref 35.0–45.0)
pH, Arterial: 7.462 — ABNORMAL HIGH (ref 7.350–7.450)
pO2, Arterial: 56 mmHg — ABNORMAL LOW (ref 80.0–100.0)

## 2016-01-12 LAB — URINALYSIS, ROUTINE W REFLEX MICROSCOPIC
Bilirubin Urine: NEGATIVE
GLUCOSE, UA: NEGATIVE mg/dL
Hgb urine dipstick: NEGATIVE
KETONES UR: NEGATIVE mg/dL
LEUKOCYTES UA: NEGATIVE
Nitrite: NEGATIVE
PH: 5.5 (ref 5.0–8.0)
Protein, ur: NEGATIVE mg/dL
Specific Gravity, Urine: 1.01 (ref 1.005–1.030)

## 2016-01-12 LAB — CBC WITH DIFFERENTIAL/PLATELET
BASOS ABS: 0 10*3/uL (ref 0.0–0.1)
BASOS PCT: 0 %
EOS ABS: 0.2 10*3/uL (ref 0.0–0.7)
Eosinophils Relative: 2 %
HCT: 31.9 % — ABNORMAL LOW (ref 39.0–52.0)
HEMOGLOBIN: 10.8 g/dL — AB (ref 13.0–17.0)
Lymphocytes Relative: 19 %
Lymphs Abs: 1.4 10*3/uL (ref 0.7–4.0)
MCH: 31.2 pg (ref 26.0–34.0)
MCHC: 33.9 g/dL (ref 30.0–36.0)
MCV: 92.2 fL (ref 78.0–100.0)
Monocytes Absolute: 0.6 10*3/uL (ref 0.1–1.0)
Monocytes Relative: 8 %
NEUTROS PCT: 71 %
Neutro Abs: 5.1 10*3/uL (ref 1.7–7.7)
Platelets: 136 10*3/uL — ABNORMAL LOW (ref 150–400)
RBC: 3.46 MIL/uL — AB (ref 4.22–5.81)
RDW: 13.3 % (ref 11.5–15.5)
WBC: 7.3 10*3/uL (ref 4.0–10.5)

## 2016-01-12 LAB — I-STAT CHEM 8, ED
BUN: 46 mg/dL — ABNORMAL HIGH (ref 6–20)
CREATININE: 2.3 mg/dL — AB (ref 0.61–1.24)
Calcium, Ion: 1.21 mmol/L (ref 1.13–1.30)
Chloride: 91 mmol/L — ABNORMAL LOW (ref 101–111)
Glucose, Bld: 121 mg/dL — ABNORMAL HIGH (ref 65–99)
HEMATOCRIT: 33 % — AB (ref 39.0–52.0)
HEMOGLOBIN: 11.2 g/dL — AB (ref 13.0–17.0)
POTASSIUM: 2.7 mmol/L — AB (ref 3.5–5.1)
Sodium: 138 mmol/L (ref 135–145)
TCO2: 34 mmol/L (ref 0–100)

## 2016-01-12 LAB — I-STAT CG4 LACTIC ACID, ED: Lactic Acid, Venous: 1.53 mmol/L (ref 0.5–2.0)

## 2016-01-12 LAB — AMMONIA: Ammonia: 17 umol/L (ref 9–35)

## 2016-01-12 LAB — MAGNESIUM: MAGNESIUM: 2 mg/dL (ref 1.7–2.4)

## 2016-01-12 LAB — PROTIME-INR
INR: 3.37 — AB (ref 0.00–1.49)
PROTHROMBIN TIME: 32.4 s — AB (ref 11.6–15.2)

## 2016-01-12 LAB — CBG MONITORING, ED: GLUCOSE-CAPILLARY: 122 mg/dL — AB (ref 65–99)

## 2016-01-12 MED ORDER — POTASSIUM CHLORIDE CRYS ER 20 MEQ PO TBCR
40.0000 meq | EXTENDED_RELEASE_TABLET | Freq: Once | ORAL | Status: AC
  Administered 2016-01-12: 40 meq via ORAL
  Filled 2016-01-12: qty 2

## 2016-01-12 MED ORDER — MAGNESIUM CHLORIDE 64 MG PO TBEC
1.0000 | DELAYED_RELEASE_TABLET | Freq: Once | ORAL | Status: AC
  Administered 2016-01-12: 64 mg via ORAL
  Filled 2016-01-12: qty 1

## 2016-01-12 NOTE — ED Notes (Signed)
Patient to CT, will administer meds when returns.

## 2016-01-12 NOTE — ED Notes (Signed)
Bed: WA02 Expected date:  Expected time:  Means of arrival:  Comments: Ems lethargic

## 2016-01-12 NOTE — ED Notes (Signed)
Pt is presented by family who report frequent fall 3 in the last week, this is new as they say "pt was fully independent." They also states he is "totally confused and spastic..." recently seen for generalized edema.

## 2016-01-12 NOTE — ED Provider Notes (Signed)
CSN: [id number]     Arrival date & time 01/12/16  1847 History   First MD Initiated Contact with Patient 01/12/16 1905     Chief Complaint  Patient presents with  . Altered Mental Status  . Weakness     (Consider location/radiation/quality/duration/timing/severity/associated sxs/prior Treatment) HPI  Edward Woodward is a(n) 82 y.o. male who presents for AMS and weakness. Level 5 caveat due to AMS. Hx given by the daughter and wife. Progressive weakness for 1 month acutely worsening since thursdya (4 days ago). 3 falls this past week, no known Head injury or LOC. On multiple medications including coumadin which he takes for Afib and bare metal stents. Thurday, started on Zaroxolyn for his edema 2 days ago. He has had increased urinary frequency since starting the medication. He had increase edema all the way up to his sacrum, he also takes torsemide. Patient has been tapering a prednisone dose for the last month. He is on a medicaition for his prostate cancer that apparently blocks adrenal function and must supplement with steroids. Past Medical History  Diagnosis Date  . Prostate cancer ([place])   . Neuropathy ([place])   . Hypercholesteremia   . Depressive disorder, not elsewhere classified   . Sinoatrial node dysfunction (HCC)   . Degeneration of lumbar or lumbosacral intervertebral disc   . CAD (coronary artery disease)      NYHA Class 1B, CABG 1985  . HTN (hypertension)   . OSA on CPAP   . Collagen vascular disease ([place])   . PE (pulmonary embolism) 03/2015  . Neuromuscular disorder (HCC)     neuropathy  . GERD (gastroesophageal reflux disease)   . Presence of permanent cardiac pacemaker    Past Surgical History  Procedure Laterality Date  . Coronary artery bypass graft    . Exploratory laparotomy    . Endarterectomy    . Rotator cuff repair    . Lumbar fusion    . Coronary angioplasty with stent placement      s/p bare metal stent implant SVG-diagonal 11/23/05, s/p BM stent  implantation SVG diagonal 10/22/06 (feeds LAD), and 05/2010 with DES to left circumflex  . Pacemaker insertion    . Esophagogastroduodenoscopy (egd) with propofol N/A 08/26/2015    Procedure: ESOPHAGOGASTRODUODENOSCOPY (EGD) WITH PROPOFOL;  Surgeon: Clarene Essex, MD;  Location: [facility] ENDOSCOPY;  Service: Endoscopy;  Laterality: N/A;  . Colonoscopy N/A 08/26/2015    Procedure: COLONOSCOPY;  Surgeon: Clarene Essex, MD;  Location[facility] ENDOSCOPY;  Service: Endoscopy;  Laterality: N/A;  . Cardiac catheterization N/A 08/27/2015    Procedure: Left Heart Cath and Cors/Grafts Angiography;  Surgeon: Belva Crome, MD;  Location: [place] CV LAB;  Service: Cardiovascular;  Laterality: N/A;   Family History  Problem Relation Age of Onset  . Heart disease Father 33   Social History  Substance Use Topics  . Smoking status: Never Smoker   . Smokeless tobacco: Never Used  . Alcohol Use: No    Review of Systems  Unable to perform ROS: Mental status change      Allergies  Penicillins and Simvastatin  Home Medications   Prior to Admission medications   Medication Sig Start Date End Date Taking? Authorizing Provider  abiraterone Acetate (ZYTIGA) 250 MG tablet Take 4 tablets (1,000 mg total) by mouth daily. Take on an empty stomach 1 hour before or 2 hours after a meal 01/06/16  Yes Ladell Pier, MD  atorvastatin (LIPITOR) 40 MG tablet Take 1 tablet (40 mg total)  by mouth every other day. Patient taking differently: Take 40 mg by mouth daily.  08/28/15  Yes Brittainy Erie Noe, PA-C  Cholecalciferol (VITAMIN D3) 2000 UNITS TABS Take 2 tablets by mouth daily.    Yes Historical Provider, MD  citalopram (CELEXA) 20 MG tablet Take 20 mg by mouth daily.     Yes Historical Provider, MD  Cyanocobalamin (VITAMIN B-12 CR) 1000 MCG TBCR Take 1 tablet by mouth daily. 04/08/10  Yes Historical Provider, MD  folic acid (FOLVITE) 1 MG tablet Take 1 tablet (1 mg total) by mouth daily. 08/28/15  Yes Brittainy Erie Noe, PA-C   gabapentin (NEURONTIN) 300 MG capsule Take 300 mg by mouth 2 (two) times daily. not to exceed 3 times daily   Yes Historical Provider, MD  isosorbide mononitrate (IMDUR) 30 MG 24 hr tablet Take 1 tablet (30 mg total) by mouth daily. 08/28/15  Yes Brittainy Erie Noe, PA-C  Melatonin 3 MG TABS Take 3 mg by mouth at bedtime.   Yes Historical Provider, MD  metolazone (ZAROXOLYN) 2.5 MG tablet Take 1 tablet (2.5 mg total) by mouth 2 (two) times a week. TAKE  1 TAB ON   Monday AND  Thursday 01/10/16  Yes Belva Crome, MD  metoprolol succinate (TOPROL-XL) 50 MG 24 hr tablet Take 1 tablet (50 mg total) by mouth daily. Take with or immediately following a meal. 10/21/15  Yes Deboraha Sprang, MD  Multiple Vitamin (MULTIVITAMIN) capsule Take 1 capsule by mouth daily.   Yes Historical Provider, MD  nitroGLYCERIN (NITROLINGUAL) 0.4 MG/SPRAY spray Place 1 spray under the tongue every 5 (five) minutes x 3 doses as needed for chest pain. 08/28/15  Yes Brittainy Erie Noe, PA-C  pantoprazole (PROTONIX) 40 MG tablet Take 1 tablet (40 mg total) by mouth 2 (two) times daily before a meal. 08/28/15  Yes Brittainy Erie Noe, PA-C  Polyethyl Glycol-Propyl Glycol (SYSTANE) 0.4-0.3 % SOLN Place 1 drop into both eyes at bedtime.    Yes Historical Provider, MD  predniSONE (DELTASONE) 5 MG tablet Take 2.5 mg daily 12/26/15  Yes Ladell Pier, MD  torsemide (DEMADEX) 20 MG tablet Take 1 tablet (20 mg total) by mouth 2 (two) times daily. 10/21/15  Yes Deboraha Sprang, MD  UBIQUINOL PO Take 1 tablet by mouth daily. CoQ10   Yes Historical Provider, MD  warfarin (COUMADIN) 5 MG tablet Take 1 tablet (5 mg total) by mouth daily at 6 PM. Except every MWF take half tablet (2.5 mg) 12/12/15  Yes Ladell Pier, MD   BP 113/61 mmHg  Pulse 65  Temp(Src) 98.1 F (36.7 C) (Oral)  Resp 18  Ht 5\' 10"  (1.778 m)  SpO2 96% Physical Exam  Constitutional: He appears well-developed and well-nourished. No distress.  HENT:  Head: Normocephalic and  atraumatic.  Eyes: Conjunctivae are normal. No scleral icterus.  Neck: Normal range of motion. Neck supple.  Cardiovascular: Normal rate, regular rhythm and normal heart sounds.   Pulmonary/Chest: Effort normal and breath sounds normal. No respiratory distress.  Abdominal: Soft. There is tenderness (mild tenderness, RLQ).  Musculoskeletal: He exhibits no edema.  Flapping tremor BL upper and lower extremities made worse with intentional movement  Neurological: He is alert.  Skin: Skin is warm and dry. He is not diaphoretic.  Psychiatric: His behavior is normal.  Nursing note and vitals reviewed.   ED Course  Procedures (including critical care time) Labs Review Labs Reviewed  CBC WITH DIFFERENTIAL/PLATELET - Abnormal; Notable for the following:  RBC 3.46 (*)    Hemoglobin 10.8 (*)    HCT 31.9 (*)    Platelets 136 (*)    All other components within normal limits  COMPREHENSIVE METABOLIC PANEL - Abnormal; Notable for the following:    Potassium 2.7 (*)    Chloride 94 (*)    CO2 35 (*)    Glucose, Bld 126 (*)    BUN 55 (*)    Creatinine, Ser 2.34 (*)    ALT 14 (*)    GFR calc non Af Amer 24 (*)    GFR calc Af Amer 27 (*)    All other components within normal limits  PROTIME-INR - Abnormal; Notable for the following:    Prothrombin Time 32.4 (*)    INR 3.37 (*)    All other components within normal limits  BLOOD GAS, ARTERIAL - Abnormal; Notable for the following:    pH, Arterial 7.462 (*)    pCO2 arterial 49.3 (*)    pO2, Arterial 56.0 (*)    Bicarbonate 34.8 (*)    Acid-Base Excess 9.9 (*)    All other components within normal limits  PROTIME-INR - Abnormal; Notable for the following:    Prothrombin Time 32.6 (*)    INR 3.40 (*)    All other components within normal limits  CBC - Abnormal; Notable for the following:    RBC 3.05 (*)    Hemoglobin 9.6 (*)    HCT 28.9 (*)    Platelets 126 (*)    All other components within normal limits  BASIC METABOLIC PANEL -  Abnormal; Notable for the following:    Potassium 2.8 (*)    Chloride 96 (*)    CO2 34 (*)    Glucose, Bld 110 (*)    BUN 53 (*)    Creatinine, Ser 2.34 (*)    GFR calc non Af Amer 24 (*)    GFR calc Af Amer 27 (*)    All other components within normal limits  CBG MONITORING, ED - Abnormal; Notable for the following:    Glucose-Capillary 122 (*)    All other components within normal limits  I-STAT CHEM 8, ED - Abnormal; Notable for the following:    Potassium 2.7 (*)    Chloride 91 (*)    BUN 46 (*)    Creatinine, Ser 2.30 (*)    Glucose, Bld 121 (*)    Hemoglobin 11.2 (*)    HCT 33.0 (*)    All other components within normal limits  URINALYSIS, ROUTINE W REFLEX MICROSCOPIC (NOT AT [facility])  AMMONIA  MAGNESIUM  I-STAT CG4 LACTIC ACID, ED    Imaging Review Ct Abdomen Pelvis Wo Contrast  01/12/2016  CLINICAL DATA:  82 year old male with right-sided abdominal pain. EXAM: CT ABDOMEN AND PELVIS WITHOUT CONTRAST TECHNIQUE: Multidetector CT imaging of the abdomen and pelvis was performed following the standard protocol without IV contrast. COMPARISON:  Abdominal CT dated 05/01/2015 FINDINGS: Evaluation of this exam is limited in the absence of intravenous contrast. Linear atelectasis/scarring at the lung bases. There is coronary vascular calcification. Cardiac pacemaker wire noted. No intra-abdominal free air or free fluid. There multiple stones within the gallbladder. There is a stone at the neck of the gallbladder. There is no pericholecystic fluid. Ultrasound may provide better evaluation of the gallbladder. The liver, pancreas, spleen, and the adrenal glands appear unremarkable. There is moderate bilateral renal atrophy. There is mild current bilateral hydronephrosis, progressed compared to the prior study. Multiple bilateral renal exophytic  hypodense lesions are not characterized but may represent cysts. These lesions measure up to 4.2 cm in the inferior pole of the right kidney. There is  apparent diffuse thickening of the bladder wall which may be partly related to underdistention. Cystitis is not excluded. Correlation with urinalysis recommended. The prostate and seminal vesicles are grossly unremarkable. Constipation. There is no evidence of bowel obstruction or active inflammation. The appendix is not visualized with certainty. There is advanced aortoiliac atherosclerotic disease. Evaluation of the vasculature is limited in the absence of intravenous contrast. There is retroperitoneal soft tissue density encasing in the abdominal aorta and the IVC. Findings may represent retroperitoneal fibrosis, or neoplastic processes such as metastatic disease, or lymphoma/ lymphoproliferative disease. There has been interval increase in the size of the retroperitoneal/periaortic soft tissue compared to the prior study. There is encasement of the common iliac arteries as well. The retroperitoneal soft tissue measures approximately 8.4 x 4.5 cm (previously 7.7 x 3.7 cm) at the level of the renal arteries. There are top-normal mesenteric lymph nodes. There is an area of stranding in the right lower quadrant which is similar or slightly increased from prior study. Midline vertical anterior abdominal wall incisional scar. There is degenerative changes of the spine. Lower lumbar laminectomy and posterior fusion hardware. No acute fracture. IMPRESSION: Progression of the retroperitoneal/ periaortic soft tissue with encasement of the aorta. 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 Moderate bilateral hydronephrosis, increased from prior study, and likely related to adhesion/compression of the proximal ureters by the retroperitoneal mass. Cholelithiasis. Constipation. No evidence of bowel obstruction or active inflammation. Electronically Signed   By: Anner Crete M.D.   On: 01/12/2016 23:10   Dg Chest 2 View  01/12/2016  CLINICAL DATA:  Hypoxia,  weakness and lethargy. Three falls in the past week. EXAM: CHEST  2 VIEW COMPARISON:  08/23/2015 FINDINGS: Sternotomy wires and left-sided pacemaker unchanged. Lungs are adequately inflated without focal consolidation or effusion. Stable borderline cardiomegaly. Surgical clips over the mediastinum, right neck and left supraclavicular region. Mild degenerative change of the spine. IMPRESSION: No active cardiopulmonary disease. Electronically Signed   By: Marin Olp M.D.   On: 01/12/2016 20:37   Ct Head Wo Contrast  01/12/2016  CLINICAL DATA:  82 year old male with fall, altered mental status, and weakness. EXAM: CT HEAD WITHOUT CONTRAST TECHNIQUE: Contiguous axial images were obtained from the base of the skull through the vertex without intravenous contrast. COMPARISON:  Head CT dated 04/15/2015 FINDINGS: There is mild age-related atrophy and chronic microvascular ischemic changes appropriate for patient's age. There is no acute intracranial hemorrhage. No mass effect or midline shift noted. The visualized paranasal sinuses and mastoid air cells are clear. The calvarium is intact. IMPRESSION: No acute intracranial hemorrhage. Age-related atrophy and chronic microvascular ischemic disease. If symptoms persist and there are no contraindications, MRI may provide better evaluation if clinically indicated. Electronically Signed   By: Anner Crete M.D.   On: 01/12/2016 22:29   I have personally reviewed and evaluated these images and lab results as part of my medical decision-making.   EKG Interpretation None      MDM   Final diagnoses:  Weakness generalized  Flapping tremor  Retroperitoneal mass  Supratherapeutic INR  Encephalopathy acute  Falls, initial encounter  Hydronephrosis, unspecified hydronephrosis type  Hypokalemia   Patient with multiple comorbidities and new flapping tremor with weakness. Unsure of the  Etiology of the patient's weakness.  DDX includes adrenal insufficiency,  anemia, hypokalemia.  Patient's  CT head is negative.  The patient is unable to ambulate.  Patient seen in shared visit with attending physician. Patient will be admitted by Dr. Myna Hidalgo.  He will need further repletion of his potassium. He has been given po magnesium and 40 mEq of PO potassium.Margarita Mail, PA-C 01/13/16 1126  Harvel Quale, MD 01/15/16 [phone]

## 2016-01-13 ENCOUNTER — Telehealth: Payer: Self-pay | Admitting: *Deleted

## 2016-01-13 ENCOUNTER — Encounter (HOSPITAL_COMMUNITY): Payer: Self-pay | Admitting: Family Medicine

## 2016-01-13 DIAGNOSIS — T451X5A Adverse effect of antineoplastic and immunosuppressive drugs, initial encounter: Secondary | ICD-10-CM | POA: Diagnosis present

## 2016-01-13 DIAGNOSIS — C8591 Non-Hodgkin lymphoma, unspecified, lymph nodes of head, face, and neck: Secondary | ICD-10-CM | POA: Diagnosis not present

## 2016-01-13 DIAGNOSIS — R935 Abnormal findings on diagnostic imaging of other abdominal regions, including retroperitoneum: Secondary | ICD-10-CM

## 2016-01-13 DIAGNOSIS — I25810 Atherosclerosis of coronary artery bypass graft(s) without angina pectoris: Secondary | ICD-10-CM | POA: Diagnosis not present

## 2016-01-13 DIAGNOSIS — Z7952 Long term (current) use of systemic steroids: Secondary | ICD-10-CM | POA: Diagnosis not present

## 2016-01-13 DIAGNOSIS — R531 Weakness: Secondary | ICD-10-CM | POA: Diagnosis not present

## 2016-01-13 DIAGNOSIS — Z955 Presence of coronary angioplasty implant and graft: Secondary | ICD-10-CM | POA: Diagnosis not present

## 2016-01-13 DIAGNOSIS — F329 Major depressive disorder, single episode, unspecified: Secondary | ICD-10-CM | POA: Diagnosis present

## 2016-01-13 DIAGNOSIS — D696 Thrombocytopenia, unspecified: Secondary | ICD-10-CM | POA: Diagnosis present

## 2016-01-13 DIAGNOSIS — G629 Polyneuropathy, unspecified: Secondary | ICD-10-CM | POA: Diagnosis present

## 2016-01-13 DIAGNOSIS — Z888 Allergy status to other drugs, medicaments and biological substances status: Secondary | ICD-10-CM | POA: Diagnosis not present

## 2016-01-13 DIAGNOSIS — R41841 Cognitive communication deficit: Secondary | ICD-10-CM | POA: Diagnosis not present

## 2016-01-13 DIAGNOSIS — G9341 Metabolic encephalopathy: Secondary | ICD-10-CM | POA: Diagnosis present

## 2016-01-13 DIAGNOSIS — Z8572 Personal history of non-Hodgkin lymphomas: Secondary | ICD-10-CM

## 2016-01-13 DIAGNOSIS — R791 Abnormal coagulation profile: Secondary | ICD-10-CM | POA: Diagnosis present

## 2016-01-13 DIAGNOSIS — I5033 Acute on chronic diastolic (congestive) heart failure: Secondary | ICD-10-CM | POA: Diagnosis not present

## 2016-01-13 DIAGNOSIS — Z515 Encounter for palliative care: Secondary | ICD-10-CM | POA: Diagnosis not present

## 2016-01-13 DIAGNOSIS — I10 Essential (primary) hypertension: Secondary | ICD-10-CM | POA: Diagnosis not present

## 2016-01-13 DIAGNOSIS — R278 Other lack of coordination: Secondary | ICD-10-CM | POA: Diagnosis not present

## 2016-01-13 DIAGNOSIS — I130 Hypertensive heart and chronic kidney disease with heart failure and stage 1 through stage 4 chronic kidney disease, or unspecified chronic kidney disease: Secondary | ICD-10-CM | POA: Diagnosis present

## 2016-01-13 DIAGNOSIS — E7800 Pure hypercholesterolemia, unspecified: Secondary | ICD-10-CM | POA: Diagnosis present

## 2016-01-13 DIAGNOSIS — Z86711 Personal history of pulmonary embolism: Secondary | ICD-10-CM | POA: Diagnosis not present

## 2016-01-13 DIAGNOSIS — C61 Malignant neoplasm of prostate: Secondary | ICD-10-CM | POA: Diagnosis not present

## 2016-01-13 DIAGNOSIS — Z79899 Other long term (current) drug therapy: Secondary | ICD-10-CM | POA: Diagnosis not present

## 2016-01-13 DIAGNOSIS — I5032 Chronic diastolic (congestive) heart failure: Secondary | ICD-10-CM | POA: Diagnosis present

## 2016-01-13 DIAGNOSIS — N183 Chronic kidney disease, stage 3 (moderate): Secondary | ICD-10-CM | POA: Diagnosis not present

## 2016-01-13 DIAGNOSIS — R2689 Other abnormalities of gait and mobility: Secondary | ICD-10-CM | POA: Diagnosis not present

## 2016-01-13 DIAGNOSIS — Z66 Do not resuscitate: Secondary | ICD-10-CM | POA: Diagnosis present

## 2016-01-13 DIAGNOSIS — K521 Toxic gastroenteritis and colitis: Secondary | ICD-10-CM | POA: Diagnosis not present

## 2016-01-13 DIAGNOSIS — R627 Adult failure to thrive: Secondary | ICD-10-CM | POA: Diagnosis not present

## 2016-01-13 DIAGNOSIS — N1330 Unspecified hydronephrosis: Secondary | ICD-10-CM | POA: Diagnosis present

## 2016-01-13 DIAGNOSIS — C8590 Non-Hodgkin lymphoma, unspecified, unspecified site: Secondary | ICD-10-CM | POA: Diagnosis present

## 2016-01-13 DIAGNOSIS — G4733 Obstructive sleep apnea (adult) (pediatric): Secondary | ICD-10-CM | POA: Diagnosis present

## 2016-01-13 DIAGNOSIS — G252 Other specified forms of tremor: Secondary | ICD-10-CM | POA: Diagnosis present

## 2016-01-13 DIAGNOSIS — Z9181 History of falling: Secondary | ICD-10-CM | POA: Diagnosis not present

## 2016-01-13 DIAGNOSIS — D6489 Other specified anemias: Secondary | ICD-10-CM | POA: Diagnosis present

## 2016-01-13 DIAGNOSIS — I481 Persistent atrial fibrillation: Secondary | ICD-10-CM | POA: Diagnosis not present

## 2016-01-13 DIAGNOSIS — T474X5A Adverse effect of other laxatives, initial encounter: Secondary | ICD-10-CM | POA: Diagnosis not present

## 2016-01-13 DIAGNOSIS — I4510 Unspecified right bundle-branch block: Secondary | ICD-10-CM | POA: Diagnosis present

## 2016-01-13 DIAGNOSIS — R4182 Altered mental status, unspecified: Secondary | ICD-10-CM | POA: Diagnosis not present

## 2016-01-13 DIAGNOSIS — Z951 Presence of aortocoronary bypass graft: Secondary | ICD-10-CM | POA: Diagnosis not present

## 2016-01-13 DIAGNOSIS — J029 Acute pharyngitis, unspecified: Secondary | ICD-10-CM | POA: Diagnosis present

## 2016-01-13 DIAGNOSIS — Z5181 Encounter for therapeutic drug level monitoring: Secondary | ICD-10-CM | POA: Diagnosis not present

## 2016-01-13 DIAGNOSIS — G9340 Encephalopathy, unspecified: Secondary | ICD-10-CM | POA: Diagnosis present

## 2016-01-13 DIAGNOSIS — I4891 Unspecified atrial fibrillation: Secondary | ICD-10-CM | POA: Diagnosis not present

## 2016-01-13 DIAGNOSIS — Z86718 Personal history of other venous thrombosis and embolism: Secondary | ICD-10-CM | POA: Diagnosis not present

## 2016-01-13 DIAGNOSIS — D6481 Anemia due to antineoplastic chemotherapy: Secondary | ICD-10-CM | POA: Diagnosis present

## 2016-01-13 DIAGNOSIS — E876 Hypokalemia: Secondary | ICD-10-CM | POA: Diagnosis present

## 2016-01-13 DIAGNOSIS — Z7901 Long term (current) use of anticoagulants: Secondary | ICD-10-CM | POA: Diagnosis not present

## 2016-01-13 DIAGNOSIS — Z880 Allergy status to penicillin: Secondary | ICD-10-CM | POA: Diagnosis not present

## 2016-01-13 DIAGNOSIS — H3530 Unspecified macular degeneration: Secondary | ICD-10-CM | POA: Diagnosis present

## 2016-01-13 DIAGNOSIS — Z8249 Family history of ischemic heart disease and other diseases of the circulatory system: Secondary | ICD-10-CM | POA: Diagnosis not present

## 2016-01-13 DIAGNOSIS — R1900 Intra-abdominal and pelvic swelling, mass and lump, unspecified site: Secondary | ICD-10-CM | POA: Diagnosis not present

## 2016-01-13 DIAGNOSIS — K219 Gastro-esophageal reflux disease without esophagitis: Secondary | ICD-10-CM | POA: Diagnosis present

## 2016-01-13 DIAGNOSIS — N179 Acute kidney failure, unspecified: Secondary | ICD-10-CM | POA: Diagnosis not present

## 2016-01-13 LAB — CBC
HEMATOCRIT: 28.9 % — AB (ref 39.0–52.0)
HEMOGLOBIN: 9.6 g/dL — AB (ref 13.0–17.0)
MCH: 31.5 pg (ref 26.0–34.0)
MCHC: 33.2 g/dL (ref 30.0–36.0)
MCV: 94.8 fL (ref 78.0–100.0)
Platelets: 126 10*3/uL — ABNORMAL LOW (ref 150–400)
RBC: 3.05 MIL/uL — ABNORMAL LOW (ref 4.22–5.81)
RDW: 13.8 % (ref 11.5–15.5)
WBC: 7.1 10*3/uL (ref 4.0–10.5)

## 2016-01-13 LAB — BASIC METABOLIC PANEL
Anion gap: 10 (ref 5–15)
BUN: 53 mg/dL — ABNORMAL HIGH (ref 6–20)
CHLORIDE: 96 mmol/L — AB (ref 101–111)
CO2: 34 mmol/L — AB (ref 22–32)
CREATININE: 2.34 mg/dL — AB (ref 0.61–1.24)
Calcium: 9.4 mg/dL (ref 8.9–10.3)
GFR calc non Af Amer: 24 mL/min — ABNORMAL LOW (ref >60)
GFR, EST AFRICAN AMERICAN: 27 mL/min — AB (ref >60)
GLUCOSE: 110 mg/dL — AB (ref 65–99)
Potassium: 2.8 mmol/L — ABNORMAL LOW (ref 3.5–5.1)
Sodium: 140 mmol/L (ref 135–145)

## 2016-01-13 LAB — PROTIME-INR
INR: 3.4 — AB (ref 0.00–1.49)
Prothrombin Time: 32.6 seconds — ABNORMAL HIGH (ref 11.6–15.2)

## 2016-01-13 MED ORDER — ACETAMINOPHEN 650 MG RE SUPP: 650.0000 mg | Freq: Four times a day (QID) | RECTAL | Status: DC | PRN

## 2016-01-13 MED ORDER — ATORVASTATIN CALCIUM 40 MG PO TABS
40.0000 mg | ORAL_TABLET | ORAL | Status: DC
  Administered 2016-01-15 – 2016-01-17 (×2): 40 mg via ORAL
  Filled 2016-01-13 (×2): qty 1

## 2016-01-13 MED ORDER — BENZOCAINE 10 % MT GEL
Freq: Four times a day (QID) | OROMUCOSAL | Status: DC | PRN
  Filled 2016-01-13: qty 9.4

## 2016-01-13 MED ORDER — SODIUM CHLORIDE 0.9 % IV SOLN
INTRAVENOUS | Status: DC
  Filled 2016-01-13: qty 1000

## 2016-01-13 MED ORDER — ISOSORBIDE MONONITRATE ER 30 MG PO TB24
30.0000 mg | ORAL_TABLET | Freq: Every day | ORAL | Status: DC
  Administered 2016-01-13 – 2016-01-17 (×5): 30 mg via ORAL
  Filled 2016-01-13 (×6): qty 1

## 2016-01-13 MED ORDER — VITAMIN B-12 1000 MCG PO TABS
1000.0000 ug | ORAL_TABLET | Freq: Every day | ORAL | Status: DC
  Administered 2016-01-13 – 2016-01-17 (×5): 1000 ug via ORAL
  Filled 2016-01-13 (×7): qty 1

## 2016-01-13 MED ORDER — METOPROLOL SUCCINATE ER 50 MG PO TB24: 50.0000 mg | ORAL_TABLET | Freq: Every day | ORAL | Status: DC

## 2016-01-13 MED ORDER — VITAMIN D 1000 UNITS PO TABS
4000.0000 [IU] | ORAL_TABLET | Freq: Every day | ORAL | Status: DC
  Administered 2016-01-13 – 2016-01-17 (×5): 4000 [IU] via ORAL
  Filled 2016-01-13 (×5): qty 4

## 2016-01-13 MED ORDER — PANTOPRAZOLE SODIUM 40 MG PO TBEC
40.0000 mg | DELAYED_RELEASE_TABLET | Freq: Two times a day (BID) | ORAL | Status: DC
Start: 2016-01-13 — End: 2016-01-17
  Administered 2016-01-13 – 2016-01-17 (×10): 40 mg via ORAL
  Filled 2016-01-13 (×10): qty 1

## 2016-01-13 MED ORDER — ADULT MULTIVITAMIN W/MINERALS CH
1.0000 | ORAL_TABLET | Freq: Every day | ORAL | Status: DC
  Administered 2016-01-13 – 2016-01-17 (×5): 1 via ORAL
  Filled 2016-01-13 (×5): qty 1

## 2016-01-13 MED ORDER — POTASSIUM CHLORIDE IN NACL 40-0.9 MEQ/L-% IV SOLN
INTRAVENOUS | Status: AC
  Administered 2016-01-13: 75 mL/h via INTRAVENOUS
  Filled 2016-01-13 (×2): qty 1000

## 2016-01-13 MED ORDER — NITROGLYCERIN 0.4 MG/SPRAY TL SOLN
1.0000 | Status: DC | PRN
  Filled 2016-01-13: qty 4.9

## 2016-01-13 MED ORDER — PREDNISONE 5 MG PO TABS
2.5000 mg | ORAL_TABLET | Freq: Every day | ORAL | Status: DC
  Administered 2016-01-13 – 2016-01-17 (×5): 2.5 mg via ORAL
  Filled 2016-01-13 (×6): qty 1

## 2016-01-13 MED ORDER — ATORVASTATIN CALCIUM 40 MG PO TABS: 40.0000 mg | ORAL_TABLET | Freq: Every day | ORAL | Status: DC

## 2016-01-13 MED ORDER — POTASSIUM CHLORIDE IN NACL 40-0.9 MEQ/L-% IV SOLN
INTRAVENOUS | Status: DC
  Administered 2016-01-13 – 2016-01-15 (×4): 75 mL/h via INTRAVENOUS
  Filled 2016-01-13 (×5): qty 1000

## 2016-01-13 MED ORDER — FOLIC ACID 1 MG PO TABS
1.0000 mg | ORAL_TABLET | Freq: Every day | ORAL | Status: DC
  Administered 2016-01-13 – 2016-01-17 (×5): 1 mg via ORAL
  Filled 2016-01-13 (×5): qty 1

## 2016-01-13 MED ORDER — PANTOPRAZOLE SODIUM 40 MG PO TBEC: 40.0000 mg | DELAYED_RELEASE_TABLET | Freq: Two times a day (BID) | ORAL | Status: DC

## 2016-01-13 MED ORDER — ONDANSETRON HCL 4 MG PO TABS: 4.0000 mg | ORAL_TABLET | Freq: Four times a day (QID) | ORAL | Status: DC | PRN

## 2016-01-13 MED ORDER — TORSEMIDE 20 MG PO TABS
20.0000 mg | ORAL_TABLET | Freq: Two times a day (BID) | ORAL | Status: DC
  Filled 2016-01-13 (×3): qty 1

## 2016-01-13 MED ORDER — BISACODYL 5 MG PO TBEC
5.0000 mg | DELAYED_RELEASE_TABLET | Freq: Every day | ORAL | Status: DC | PRN
  Filled 2016-01-13: qty 1

## 2016-01-13 MED ORDER — CITALOPRAM HYDROBROMIDE 20 MG PO TABS
20.0000 mg | ORAL_TABLET | Freq: Every day | ORAL | Status: DC
  Administered 2016-01-13 – 2016-01-17 (×5): 20 mg via ORAL
  Filled 2016-01-13 (×5): qty 1

## 2016-01-13 MED ORDER — ISOSORBIDE MONONITRATE ER 30 MG PO TB24: 30.0000 mg | ORAL_TABLET | Freq: Every day | ORAL | Status: DC

## 2016-01-13 MED ORDER — MULTIVITAMINS PO CAPS: 1.0000 | ORAL_CAPSULE | Freq: Every day | ORAL | Status: DC

## 2016-01-13 MED ORDER — WARFARIN - PHARMACIST DOSING INPATIENT: Freq: Every day | Status: DC

## 2016-01-13 MED ORDER — POTASSIUM CHLORIDE CRYS ER 20 MEQ PO TBCR
40.0000 meq | EXTENDED_RELEASE_TABLET | Freq: Once | ORAL | Status: AC
  Administered 2016-01-13: 40 meq via ORAL
  Filled 2016-01-13: qty 2

## 2016-01-13 MED ORDER — DOCUSATE SODIUM 100 MG PO CAPS
100.0000 mg | ORAL_CAPSULE | Freq: Two times a day (BID) | ORAL | Status: DC
  Administered 2016-01-13 – 2016-01-14 (×3): 100 mg via ORAL
  Filled 2016-01-13 (×7): qty 1

## 2016-01-13 MED ORDER — HYDROCODONE-ACETAMINOPHEN 5-325 MG PO TABS: 1.0000 | ORAL_TABLET | ORAL | Status: DC | PRN

## 2016-01-13 MED ORDER — GABAPENTIN 300 MG PO CAPS
300.0000 mg | ORAL_CAPSULE | Freq: Two times a day (BID) | ORAL | Status: DC
  Administered 2016-01-13 – 2016-01-17 (×10): 300 mg via ORAL
  Filled 2016-01-13 (×10): qty 1

## 2016-01-13 MED ORDER — METOPROLOL SUCCINATE ER 50 MG PO TB24
50.0000 mg | ORAL_TABLET | Freq: Every day | ORAL | Status: DC
Start: 2016-01-13 — End: 2016-01-17
  Administered 2016-01-13 – 2016-01-17 (×5): 50 mg via ORAL
  Filled 2016-01-13 (×6): qty 1

## 2016-01-13 MED ORDER — POLYETHYL GLYCOL-PROPYL GLYCOL 0.4-0.3 % OP SOLN: 1.0000 [drp] | Freq: Every day | OPHTHALMIC | Status: DC

## 2016-01-13 MED ORDER — POLYETHYLENE GLYCOL 3350 17 G PO PACK
17.0000 g | PACK | Freq: Every day | ORAL | Status: DC | PRN
  Filled 2016-01-13: qty 1

## 2016-01-13 MED ORDER — ABIRATERONE ACETATE 250 MG PO TABS
1000.0000 mg | ORAL_TABLET | Freq: Every day | ORAL | Status: DC
  Administered 2016-01-13 – 2016-01-17 (×5): 1000 mg via ORAL

## 2016-01-13 MED ORDER — TORSEMIDE 20 MG PO TABS
20.0000 mg | ORAL_TABLET | Freq: Two times a day (BID) | ORAL | Status: DC
  Administered 2016-01-14 – 2016-01-17 (×7): 20 mg via ORAL
  Filled 2016-01-13 (×11): qty 1

## 2016-01-13 MED ORDER — POLYVINYL ALCOHOL 1.4 % OP SOLN
1.0000 [drp] | Freq: Every day | OPHTHALMIC | Status: DC
  Administered 2016-01-13 – 2016-01-15 (×3): 1 [drp] via OPHTHALMIC
  Filled 2016-01-13: qty 15

## 2016-01-13 MED ORDER — ATORVASTATIN CALCIUM 40 MG PO TABS
40.0000 mg | ORAL_TABLET | Freq: Every day | ORAL | Status: DC
  Administered 2016-01-13: 40 mg via ORAL
  Filled 2016-01-13: qty 1

## 2016-01-13 MED ORDER — ACETAMINOPHEN 325 MG PO TABS: 650.0000 mg | ORAL_TABLET | Freq: Four times a day (QID) | ORAL | Status: DC | PRN

## 2016-01-13 MED ORDER — LIP MEDEX EX OINT
TOPICAL_OINTMENT | CUTANEOUS | Status: AC
  Administered 2016-01-13: 1
  Filled 2016-01-13: qty 7

## 2016-01-13 MED ORDER — ONDANSETRON HCL 4 MG/2ML IJ SOLN
4.0000 mg | Freq: Four times a day (QID) | INTRAMUSCULAR | Status: DC | PRN
Start: 2016-01-13 — End: 2016-01-17

## 2016-01-13 NOTE — Evaluation (Signed)
Physical Therapy Evaluation Patient Details Name: BARRI NORELL MRN: [id number] DOB: 17-Oct-1926 Today's Date: 01/13/2016   History of Present Illness  82 yo male admitted with acute encephalopathy, falls. Hx of prostate cancer, NHL, CAD, CABG, A fib, PE, pacemaker, neuropathy, HTN, NM d/o  Clinical Impression  On eval, pt required Max assist for mobility-sat EOB ~3 minutes. Pt required Mod-Max assist to sit at EOB due to heavy posterior lean. Unable to safely attempt standing at this time. No family present during session. Recommend SNF.     Follow Up Recommendations SNF    Equipment Recommendations  Wheelchair cushion ;Wheelchair ;Hospital bed;Rolling walker with 5" wheels    Recommendations for Other Services OT consult     Precautions / Restrictions Precautions Precautions: Fall Restrictions Weight Bearing Restrictions: No      Mobility  Bed Mobility Overal bed mobility: Needs Assistance Bed Mobility: Supine to Sit;Sit to Supine     Supine to sit: Max assist;HOB elevated Sit to supine: HOB elevated   General bed mobility comments: Assist for bil LEs and trunk. Multimodal cueing for required. Increased time. Noted jerking, poor coordination with any attemtps at movement. Utilized bedpad for scooting, positioning.   Transfers                 General transfer comment: NT-unable to safely attempt. Pt with heavy posterior lean. Pt with inability to shift weight anteriorly. He was unable to sit unassisted at EOB.   Ambulation/Gait                Stairs            Wheelchair Mobility    Modified Rankin (Stroke Patients Only)       Balance Overall balance assessment: Needs assistance;History of Falls Sitting-balance support: Bilateral upper extremity supported;Feet supported Sitting balance-Leahy Scale: Poor Sitting balance - Comments: Sat EOB ~3 mintues with Mod-Max assist to prevent LOB.                                       Pertinent Vitals/Pain Pain Assessment: No/denies pain    Home Living Family/patient expects to be discharged to:: Unsure Living Arrangements: Spouse/significant other Available Help at Discharge: Family;Available 24 hours/day Type of Home: House Home Access: Stairs to enter     Home Layout: One level   Additional Comments: info taken from previous admission    Prior Function Level of Independence: Independent with assistive device(s)               Hand Dominance        Extremity/Trunk Assessment   Upper Extremity Assessment: Generalized weakness           Lower Extremity Assessment: Generalized weakness      Cervical / Trunk Assessment: Normal  Communication   Communication: HOH  Cognition Arousal/Alertness: Awake/alert Behavior During Therapy: WFL for tasks assessed/performed Overall Cognitive Status: Impaired/Different from baseline Area of Impairment: Problem solving;Following commands       Following Commands: Follows one step commands with increased time     Problem Solving: Requires tactile cues;Requires verbal cues;Slow processing;Decreased initiation      General Comments      Exercises        Assessment/Plan    PT Assessment Patient needs continued PT services  PT Diagnosis Difficulty walking;Generalized weakness;Altered mental status   PT Problem List Decreased strength;Decreased activity tolerance;Decreased balance;Decreased mobility;Decreased knowledge of  use of DME  PT Treatment Interventions DME instruction;Gait training;Functional mobility training;Patient/family education;Therapeutic activities;Balance training;Therapeutic exercise   PT Goals (Current goals can be found in the Care Plan section) Acute Rehab PT Goals Patient Stated Goal: none stated PT Goal Formulation: Patient unable to participate in goal setting Time For Goal Achievement: 01/27/16 Potential to Achieve Goals: Good    Frequency Min 3X/week    Barriers to discharge        Co-evaluation               End of Session   Activity Tolerance: Patient tolerated treatment well Patient left: in bed;with call bell/phone within reach;with bed alarm set      Functional Assessment Tool Used: clinical judgement Functional Limitation: Mobility: Walking and moving around Mobility: Walking and Moving Around Current Status [id number]): At least 60 percent but less than 80 percent impaired, limited or restricted Mobility: Walking and Moving Around Goal Status [phone]): At least 20 percent but less than 40 percent impaired, limited or restricted    Time: 0942-0955 PT Time Calculation (min) (ACUTE ONLY): 13 min   Charges:   PT Evaluation $PT Eval Low Complexity: 1 Procedure     PT G Codes:   PT G-Codes **NOT FOR INPATIENT CLASS** Functional Assessment Tool Used: clinical judgement Functional Limitation: Mobility: Walking and moving around Mobility: Walking and Moving Around Current Status [id number]): At least 60 percent but less than 80 percent impaired, limited or restricted Mobility: Walking and Moving Around Goal Status [phone]): At least 20 percent but less than 40 percent impaired, limited or restricted    Weston Anna, MPT Pager: [phone]

## 2016-01-13 NOTE — Telephone Encounter (Signed)
Call from pt's daughter reporting was admitted to [facility] last night with weakness and mental status changes. Pt was also "almost ataxic" with spastic movements requiring 2+ assistance to transfer. Will make Dr. Benay Spice aware. Message from [place] on 3W also to inform Dr. Benay Spice of pt's admission.

## 2016-01-13 NOTE — Progress Notes (Signed)
PROGRESS NOTE    Edward Woodward  [id number] DOB: September 07, 1926 DOA: 01/12/2016 PCP: Mathews Argyle, MD   Brief Narrative:  HPI on 01/13/2016 by Dr. Christia Reading Opyd [name] is a 82 y.o. male with medical history significant for prostate cancer, non-Hodgkin's lymphoma on chemotherapy, depression, CAD status post CABG, atrial fibrillation on Coumadin, and sinus node dysfunction with pacer who presents to the ED with worsening lethargy, confusion, and falls. Patient has reportedly been in declining health for quite some time with decreasing appetite and increasing weakness. This has been significantly amplified in recent days. Several days ago, he was ambulating by himself, feeding himself, and cogent. Now, he is brought in by his wife and daughter with lethargy, inability to ambulate even with assistance, severe upper extremity tremor, confusion, and falls. Patient is on Coumadin, but denies striking his head during any of these falls or losing consciousness. He has some superficial abrasions on the lower extremities, but denies any other significant injury or pain. Patient denies any recent fevers, chills, chest pain, palpitations, dyspnea, or cough. He denies abdominal pain, nausea, vomiting, or diarrhea.   Assessment & Plan   Acute encephalopathy  -Uncertain etiology at this time; suspected toxic-metabolic in setting of electrolyte derangements and AKI  -Head CT negative for acute intracranial abnormality -Ammonia normal -Continue to monitor closely  AKI superimposed on CKD stage III  -Cr 2.34 on admission, up from 1.7 in May and 1.2 in February 2017  -Suspect a post-renal etiology secondary to extrinsic compression of bilateral ureters by retroperitoneal mass  -Providing a gentle IV hydration  -Avoiding nephrotoxins, holding diuretics  -Monitor intake/output  Atrial fibrillation with supratherapeutic INR -CHADS-VASc at least 4 (age x2, HTN, CHF) -On AC with coumadin,  INR 3.37 on admission -Continue coumadin per pharmacy -Continue metoprolol   Non-Hodgkin lymphoma, prostate cancer  -Followed by oncology with ongoing chemo for NHL; hormone therapy for prostate CA with recent increase in PSA per notes  -Retroperitoneal mass with interval enlargement noted on CT abd/pel  -Ongoing management per oncology team- Dr. Benay Spice made aware of patient's admission  OSA  -Continue CPAP QHS  Hypertension  -Continue Toprol, hold diuretics for now in setting of AKI   Normocytic anemia -Hemoglobin 10.8 and stable  -No sign of active losses  -Continue to monitor CBC  Hypokalemia  -Serum potassium 2.7 on admission  -Suspected secondary to poor PO intake and loop diuretics -Continue to replace and montior BMP -Magnesium 2  Chronic diastolic CHF  -TTE (123XX123) with EF 60-65%, moderate concentric hypertrophy, grade 1 diastolic dysfunction, mild LAE  -Managed with torsemide, metolazone, and metoprolol at home -Holding diuretics for now while gently hydrating  -Follow strict I/O's, daily wts; fluid-restrict diet, resume diuretics as appropriate   DVT Prophylaxis  Coumadin  Code Status: DNR  Family Communication: Wife at bedside  Disposition Plan: Admitted for observation  Consultants None  Procedures  None  Antibiotics   Anti-infectives    None      Subjective:   Edward Woodward seen and examined today.  Patient has no complaints. Denies chest pain, shortness of breath, abdominal pain, dizziness, headache.   Objective:   Filed Vitals:   01/12/16 2130 01/12/16 2356 01/13/16 0127 01/13/16 0508  BP: 132/64 131/83 127/60 113/61  Pulse: 27 65 64 65  Temp:  97.9 F (36.6 C) 98.5 F (36.9 C) 98.1 F (36.7 C)  TempSrc:  Oral Oral Oral  Resp: 14 16 16 18   Height:   5\' 10"  (1.778  m)   SpO2: 81% 100% 100% 96%    Intake/Output Summary (Last 24 hours) at 01/13/16 1445 Last data filed at 01/13/16 0508  Gross per 24 hour  Intake     355 ml  Output      0 ml  Net    355 ml   There were no vitals filed for this visit.  Exam  General: Well developed, well nourished, NAD, appears stated age  13: NCAT,mucous membranes moist.   Cardiovascular: S1 S2 auscultated,  2/6 SEM, RRR  Respiratory: Clear to auscultation bilaterally  Abdomen: Soft, nontender, nondistended, + bowel sounds  Extremities: warm dry without cyanosis clubbing. LE edema.   Neuro: AAOx 2 (place, person), nonfocal  Psych: Normal affect and demeanor    Data Reviewed: I have personally reviewed following labs and imaging studies  CBC:  Recent Labs Lab 01/12/16 1959 01/12/16 2009 01/13/16 0824  WBC 7.3  --  7.1  NEUTROABS 5.1  --   --   HGB 10.8* 11.2* 9.6*  HCT 31.9* 33.0* 28.9*  MCV 92.2  --  94.8  PLT 136*  --  123XX123*   Basic Metabolic Panel:  Recent Labs Lab 01/09/16 1306 01/12/16 1959 01/12/16 2009 01/13/16 0824  NA 140 139 138 140  K 3.6 2.7* 2.7* 2.8*  CL 96* 94* 91* 96*  CO2 32* 35*  --  34*  GLUCOSE 127* 126* 121* 110*  BUN 48* 55* 46* 53*  CREATININE 2.23* 2.34* 2.30* 2.34*  CALCIUM 10.0 10.0  --  9.4  MG  --  2.0  --   --    GFR: Estimated Creatinine Clearance: 27.5 mL/min (by C-G formula based on Cr of 2.34). Liver Function Tests:  Recent Labs Lab 01/12/16 1959  AST 22  ALT 14*  ALKPHOS 69  BILITOT 0.9  PROT 6.7  ALBUMIN 4.0   No results for input(s): LIPASE, AMYLASE in the last 168 hours.  Recent Labs Lab 01/12/16 1959  AMMONIA 17   Coagulation Profile:  Recent Labs Lab 01/12/16 1959 01/13/16 0346  INR 3.37* 3.40*   Cardiac Enzymes: No results for input(s): CKTOTAL, CKMB, CKMBINDEX, TROPONINI in the last 168 hours. BNP (last 3 results) No results for input(s): PROBNP in the last 8760 hours. HbA1C: No results for input(s): HGBA1C in the last 72 hours. CBG:  Recent Labs Lab 01/12/16 1949  GLUCAP 122*   Lipid Profile: No results for input(s): CHOL, HDL, LDLCALC, TRIG, CHOLHDL,  LDLDIRECT in the last 72 hours. Thyroid Function Tests: No results for input(s): TSH, T4TOTAL, FREET4, T3FREE, THYROIDAB in the last 72 hours. Anemia Panel: No results for input(s): VITAMINB12, FOLATE, FERRITIN, TIBC, IRON, RETICCTPCT in the last 72 hours. Urine analysis:    Component Value Date/Time   COLORURINE YELLOW 01/12/2016 2046   APPEARANCEUR CLEAR 01/12/2016 2046   LABSPEC 1.010 01/12/2016 2046   PHURINE 5.5 01/12/2016 2046   GLUCOSEU NEGATIVE 01/12/2016 2046   HGBUR NEGATIVE 01/12/2016 2046   BILIRUBINUR NEGATIVE 01/12/2016 2046   KETONESUR NEGATIVE 01/12/2016 2046   PROTEINUR NEGATIVE 01/12/2016 2046   NITRITE NEGATIVE 01/12/2016 2046   LEUKOCYTESUR NEGATIVE 01/12/2016 2046   Sepsis Labs: @LABRCNTIP (procalcitonin:4,lacticidven:4)  )No results found for this or any previous visit (from the past 240 hour(s)).    Radiology Studies: Ct Abdomen Pelvis Wo Contrast  01/12/2016  CLINICAL DATA:  82 year old male with right-sided abdominal pain. EXAM: CT ABDOMEN AND PELVIS WITHOUT CONTRAST TECHNIQUE: Multidetector CT imaging of the abdomen and pelvis was performed following the standard protocol  without IV contrast. COMPARISON:  Abdominal CT dated 05/01/2015 FINDINGS: Evaluation of this exam is limited in the absence of intravenous contrast. Linear atelectasis/scarring at the lung bases. There is coronary vascular calcification. Cardiac pacemaker wire noted. No intra-abdominal free air or free fluid. There multiple stones within the gallbladder. There is a stone at the neck of the gallbladder. There is no pericholecystic fluid. Ultrasound may provide better evaluation of the gallbladder. The liver, pancreas, spleen, and the adrenal glands appear unremarkable. There is moderate bilateral renal atrophy. There is mild current bilateral hydronephrosis, progressed compared to the prior study. Multiple bilateral renal exophytic hypodense lesions are not characterized but may represent cysts.  These lesions measure up to 4.2 cm in the inferior pole of the right kidney. There is apparent diffuse thickening of the bladder wall which may be partly related to underdistention. Cystitis is not excluded. Correlation with urinalysis recommended. The prostate and seminal vesicles are grossly unremarkable. Constipation. There is no evidence of bowel obstruction or active inflammation. The appendix is not visualized with certainty. There is advanced aortoiliac atherosclerotic disease. Evaluation of the vasculature is limited in the absence of intravenous contrast. There is retroperitoneal soft tissue density encasing in the abdominal aorta and the IVC. Findings may represent retroperitoneal fibrosis, or neoplastic processes such as metastatic disease, or lymphoma/ lymphoproliferative disease. There has been interval increase in the size of the retroperitoneal/periaortic soft tissue compared to the prior study. There is encasement of the common iliac arteries as well. The retroperitoneal soft tissue measures approximately 8.4 x 4.5 cm (previously 7.7 x 3.7 cm) at the level of the renal arteries. There are top-normal mesenteric lymph nodes. There is an area of stranding in the right lower quadrant which is similar or slightly increased from prior study. Midline vertical anterior abdominal wall incisional scar. There is degenerative changes of the spine. Lower lumbar laminectomy and posterior fusion hardware. No acute fracture. IMPRESSION: Progression of the retroperitoneal/ periaortic soft tissue with encasement of the aorta. 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 Moderate bilateral hydronephrosis, increased from prior study, and likely related to adhesion/compression of the proximal ureters by the retroperitoneal mass. Cholelithiasis. Constipation. No evidence of bowel obstruction or active inflammation. Electronically Signed   By: Anner Crete M.D.    On: 01/12/2016 23:10   Dg Chest 2 View  01/12/2016  CLINICAL DATA:  Hypoxia, weakness and lethargy. Three falls in the past week. EXAM: CHEST  2 VIEW COMPARISON:  08/23/2015 FINDINGS: Sternotomy wires and left-sided pacemaker unchanged. Lungs are adequately inflated without focal consolidation or effusion. Stable borderline cardiomegaly. Surgical clips over the mediastinum, right neck and left supraclavicular region. Mild degenerative change of the spine. IMPRESSION: No active cardiopulmonary disease. Electronically Signed   By: Marin Olp M.D.   On: 01/12/2016 20:37   Ct Head Wo Contrast  01/12/2016  CLINICAL DATA:  82 year old male with fall, altered mental status, and weakness. EXAM: CT HEAD WITHOUT CONTRAST TECHNIQUE: Contiguous axial images were obtained from the base of the skull through the vertex without intravenous contrast. COMPARISON:  Head CT dated 04/15/2015 FINDINGS: There is mild age-related atrophy and chronic microvascular ischemic changes appropriate for patient's age. There is no acute intracranial hemorrhage. No mass effect or midline shift noted. The visualized paranasal sinuses and mastoid air cells are clear. The calvarium is intact. IMPRESSION: No acute intracranial hemorrhage. Age-related atrophy and chronic microvascular ischemic disease. If symptoms persist and there are no contraindications, MRI may provide better evaluation if clinically indicated.  Electronically Signed   By: Anner Crete M.D.   On: 01/12/2016 22:29     Scheduled Meds: . abiraterone Acetate  1,000 mg Oral Daily  . [START ON 01/15/2016] atorvastatin  40 mg Oral QODAY  . cholecalciferol  4,000 Units Oral Daily  . citalopram  20 mg Oral Daily  . docusate sodium  100 mg Oral BID  . folic acid  1 mg Oral Daily  . gabapentin  300 mg Oral BID  . isosorbide mononitrate  30 mg Oral Daily  . lip balm      . metoprolol succinate  50 mg Oral Daily  . multivitamin with minerals  1 tablet Oral Daily  .  pantoprazole  40 mg Oral BID AC  . polyvinyl alcohol  1 drop Both Eyes QHS  . predniSONE  2.5 mg Oral Q breakfast  . [START ON 01/14/2016] torsemide  20 mg Oral BID  . vitamin B-12  1,000 mcg Oral Daily  . Warfarin - Pharmacist Dosing Inpatient   Does not apply q1800   Continuous Infusions: . 0.9 % NaCl with KCl 40 mEq / L         Time Spent in minutes   30 minutes  Vonna Brabson D.O. on 01/13/2016 at 2:45 PM  Between 7am to 7pm - Pager - [phone]  After 7pm go to [email or web address] - password [email or web address]  And look for the night coverage person covering for me after hours  [facility] Office  [phone]

## 2016-01-13 NOTE — H&P (Signed)
History and Physical    Edward Woodward [id number] DOB: 1926-10-08 DOA: 01/12/2016  PCP: Mathews Argyle, MD   Patient coming from: Home   Chief Complaint: Weakness, falls, confusion   HPI: Edward Woodward is a 82 y.o. male with medical history significant for prostate cancer, non-Hodgkin's lymphoma on chemotherapy, depression, CAD status post CABG, atrial fibrillation on Coumadin, and sinus node dysfunction with pacer who presents to the ED with worsening lethargy, confusion, and falls. Patient has reportedly been in declining health for quite some time with decreasing appetite and increasing weakness. This has been significantly amplified in recent days. Several days ago, he was ambulating by himself, feeding himself, and cogent. Now, he is brought in by his wife and daughter with lethargy, inability to ambulate even with assistance, severe upper extremity tremor, confusion, and falls. Patient is on Coumadin, but denies striking his head during any of these falls or losing consciousness. He has some superficial abrasions on the lower extremities, but denies any other significant injury or pain. Patient denies any recent fevers, chills, chest pain, palpitations, dyspnea, or cough. He denies abdominal pain, nausea, vomiting, or diarrhea.    ED Course: Upon arrival to the ED, patient is found to be afebrile, saturating adequately on room air, and with vital signs stable. EKG demonstrates an atrial paced rhythm with right bundle branch block and nonspecific repolarization abnormality in the lateral leads. Chest x-ray is negative for acute cardiopulmonary disease. CT of the head was obtained and negative for acute intracranial abnormality. CMP is notable for potassium 2.7, BUN of 55, and serum creatinine of 2.34, up from 1.7 in May. CBC is notable for a hemoglobin of 10.8 with normal MCV and apparently stable relative to prior measurements. Lactic acid is 1.53 and INR is elevated to a value of  3.37. Urinalysis is obtained and grossly unremarkable. CT of the abdomen was performed and remarkable for interval progression of a retroperitoneal/periaortic mass with encasement of the aorta suggestive of retroperitoneal fibrosis, adenopathy, or lymphoma. Also noted on CT abdomen is bilateral hydronephrosis which has worsened since the prior study and is likely secondary to compression of the proximal ureters by the aforementioned mass. Patient was treated with magnesium and potassium by mouth, remained hemodynamically stable, and will be observed in the hospital for further evaluation and management of acute encephalopathy.   Review of Systems:  All other systems reviewed and apart from HPI, are negative.  Past Medical History  Diagnosis Date  . Prostate cancer ([place])   . Neuropathy ([place])   . Hypercholesteremia   . Depressive disorder, not elsewhere classified   . Sinoatrial node dysfunction ([facility])   . Degeneration of lumbar or lumbosacral intervertebral disc   . CAD (coronary artery disease)      NYHA Class 1B, CABG 1985  . HTN (hypertension)   . OSA on CPAP   . Collagen vascular disease ([place])   . PE (pulmonary embolism) 03/2015  . Neuromuscular disorder ([facility])     neuropathy  . GERD (gastroesophageal reflux disease)   . Presence of permanent cardiac pacemaker     Past Surgical History  Procedure Laterality Date  . Coronary artery bypass graft    . Exploratory laparotomy    . Endarterectomy    . Rotator cuff repair    . Lumbar fusion    . Coronary angioplasty with stent placement      s/p bare metal stent implant SVG-diagonal 11/23/05, s/p BM stent implantation SVG diagonal 10/22/06 (feeds LAD), and  05/2010 with DES to left circumflex  . Pacemaker insertion    . Esophagogastroduodenoscopy (egd) with propofol N/A 08/26/2015    Procedure: ESOPHAGOGASTRODUODENOSCOPY (EGD) WITH PROPOFOL;  Surgeon: Clarene Essex, MD;  Location: [facility] ENDOSCOPY;  Service: Endoscopy;  Laterality: N/A;  .  Colonoscopy N/A 08/26/2015    Procedure: COLONOSCOPY;  Surgeon: Clarene Essex, MD;  Location: [facility] ENDOSCOPY;  Service: Endoscopy;  Laterality: N/A;  . Cardiac catheterization N/A 08/27/2015    Procedure: Left Heart Cath and Cors/Grafts Angiography;  Surgeon: Belva Crome, MD;  Location: [place] CV LAB;  Service: Cardiovascular;  Laterality: N/A;     reports that he has never smoked. He has never used smokeless tobacco. He reports that he does not drink alcohol or use illicit drugs.  Allergies  Allergen Reactions  . Penicillins Swelling  . Simvastatin Other (See Comments)    Muscle weakness in legs ?    Family History  Problem Relation Age of Onset  . Heart disease Father 62     Prior to Admission medications   Medication Sig Start Date End Date Taking? Authorizing Provider  abiraterone Acetate (ZYTIGA) 250 MG tablet Take 4 tablets (1,000 mg total) by mouth daily. Take on an empty stomach 1 hour before or 2 hours after a meal 01/06/16  Yes Ladell Pier, MD  atorvastatin (LIPITOR) 40 MG tablet Take 1 tablet (40 mg total) by mouth every other day. Patient taking differently: Take 40 mg by mouth daily.  08/28/15  Yes Brittainy Erie Noe, PA-C  Cholecalciferol (VITAMIN D3) 2000 UNITS TABS Take 2 tablets by mouth daily.    Yes Historical Provider, MD  citalopram (CELEXA) 20 MG tablet Take 20 mg by mouth daily.     Yes Historical Provider, MD  Cyanocobalamin (VITAMIN B-12 CR) 1000 MCG TBCR Take 1 tablet by mouth daily. 04/08/10  Yes Historical Provider, MD  folic acid (FOLVITE) 1 MG tablet Take 1 tablet (1 mg total) by mouth daily. 08/28/15  Yes Brittainy Erie Noe, PA-C  gabapentin (NEURONTIN) 300 MG capsule Take 300 mg by mouth 2 (two) times daily. not to exceed 3 times daily   Yes Historical Provider, MD  isosorbide mononitrate (IMDUR) 30 MG 24 hr tablet Take 1 tablet (30 mg total) by mouth daily. 08/28/15  Yes Brittainy Erie Noe, PA-C  Melatonin 3 MG TABS Take 3 mg by mouth at bedtime.   Yes  Historical Provider, MD  metolazone (ZAROXOLYN) 2.5 MG tablet Take 1 tablet (2.5 mg total) by mouth 2 (two) times a week. TAKE  1 TAB ON   Monday AND  Thursday 01/10/16  Yes Belva Crome, MD  metoprolol succinate (TOPROL-XL) 50 MG 24 hr tablet Take 1 tablet (50 mg total) by mouth daily. Take with or immediately following a meal. 10/21/15  Yes Deboraha Sprang, MD  Multiple Vitamin (MULTIVITAMIN) capsule Take 1 capsule by mouth daily.   Yes Historical Provider, MD  nitroGLYCERIN (NITROLINGUAL) 0.4 MG/SPRAY spray Place 1 spray under the tongue every 5 (five) minutes x 3 doses as needed for chest pain. 08/28/15  Yes Brittainy Erie Noe, PA-C  pantoprazole (PROTONIX) 40 MG tablet Take 1 tablet (40 mg total) by mouth 2 (two) times daily before a meal. 08/28/15  Yes Brittainy Erie Noe, PA-C  Polyethyl Glycol-Propyl Glycol (SYSTANE) 0.4-0.3 % SOLN Place 1 drop into both eyes at bedtime.    Yes Historical Provider, MD  predniSONE (DELTASONE) 5 MG tablet Take 2.5 mg daily 12/26/15  Yes Ladell Pier, MD  torsemide (DEMADEX) 20 MG tablet Take 1 tablet (20 mg total) by mouth 2 (two) times daily. 10/21/15  Yes Deboraha Sprang, MD  UBIQUINOL PO Take 1 tablet by mouth daily. CoQ10   Yes Historical Provider, MD  warfarin (COUMADIN) 5 MG tablet Take 1 tablet (5 mg total) by mouth daily at 6 PM. Except every MWF take half tablet (2.5 mg) 12/12/15  Yes Ladell Pier, MD    Physical Exam: Filed Vitals:   01/12/16 2100 01/12/16 2115 01/12/16 2130 01/12/16 2356  BP: 134/57  132/64 131/83  Pulse: 65 64 27 65  Temp:    97.9 F (36.6 C)  TempSrc:    Oral  Resp: 18 16 14 16   SpO2: 99% 100% 81% 100%      Constitutional: NAD, calm, comfortable Eyes: PERTLA, lids and conjunctivae normal ENMT: Mucous membranes are dry. Posterior pharynx clear of any exudate or lesions.   Neck: normal, supple, no masses, no thyromegaly Respiratory: clear to auscultation bilaterally, no wheezing, no crackles. Normal respiratory effort.     Cardiovascular: S1 & S2 heard, regular rate and rhythm. No carotid bruits. No significant JVD. Abdomen: No distension, no tenderness, no masses palpated. Bowel sounds normal.  Musculoskeletal: no clubbing / cyanosis. No joint deformity upper and lower extremities. Normal muscle tone.  Skin: no significant rashes, lesions, ulcers. Warm, dry, well-perfused. Neurologic: CN 2-12 grossly intact. Sensation intact, DTR normal. Asterixis. Dysarthria.  Psychiatric: Normal judgment and insight. Alert and oriented x 3. Normal mood and affect.     Labs on Admission: I have personally reviewed following labs and imaging studies  CBC:  Recent Labs Lab 01/06/16 1051 01/12/16 1959 01/12/16 2009  WBC 6.3 7.3  --   NEUTROABS 4.5 5.1  --   HGB 10.4* 10.8* 11.2*  HCT 31.2* 31.9* 33.0*  MCV 94.3 92.2  --   PLT 139* 136*  --    Basic Metabolic Panel:  Recent Labs Lab 01/06/16 1051 01/09/16 1306 01/12/16 1959 01/12/16 2009  NA 142 140 139 138  K 3.8 3.6 2.7* 2.7*  CL  --  96* 94* 91*  CO2 30* 32* 35*  --   GLUCOSE 137 127* 126* 121*  BUN 39.1* 48* 55* 46*  CREATININE 2.1* 2.23* 2.34* 2.30*  CALCIUM 9.7 10.0 10.0  --   MG  --   --  2.0  --    GFR: Estimated Creatinine Clearance: 28 mL/min (by C-G formula based on Cr of 2.3). Liver Function Tests:  Recent Labs Lab 01/12/16 1959  AST 22  ALT 14*  ALKPHOS 69  BILITOT 0.9  PROT 6.7  ALBUMIN 4.0   No results for input(s): LIPASE, AMYLASE in the last 168 hours.  Recent Labs Lab 01/12/16 1959  AMMONIA 17   Coagulation Profile:  Recent Labs Lab 01/06/16 1051 01/12/16 1959  INR 3.20 3.37*  PROTIME 38.4*  --    Cardiac Enzymes: No results for input(s): CKTOTAL, CKMB, CKMBINDEX, TROPONINI in the last 168 hours. BNP (last 3 results) No results for input(s): PROBNP in the last 8760 hours. HbA1C: No results for input(s): HGBA1C in the last 72 hours. CBG:  Recent Labs Lab 01/12/16 1949  GLUCAP 122*   Lipid  Profile: No results for input(s): CHOL, HDL, LDLCALC, TRIG, CHOLHDL, LDLDIRECT in the last 72 hours. Thyroid Function Tests: No results for input(s): TSH, T4TOTAL, FREET4, T3FREE, THYROIDAB in the last 72 hours. Anemia Panel: No results for input(s): VITAMINB12, FOLATE, FERRITIN, TIBC, IRON, RETICCTPCT in the last  72 hours. Urine analysis:    Component Value Date/Time   COLORURINE YELLOW 01/12/2016 2046   APPEARANCEUR CLEAR 01/12/2016 2046   LABSPEC 1.010 01/12/2016 2046   PHURINE 5.5 01/12/2016 2046   GLUCOSEU NEGATIVE 01/12/2016 2046   HGBUR NEGATIVE 01/12/2016 2046   BILIRUBINUR NEGATIVE 01/12/2016 2046   KETONESUR NEGATIVE 01/12/2016 2046   PROTEINUR NEGATIVE 01/12/2016 2046   NITRITE NEGATIVE 01/12/2016 2046   LEUKOCYTESUR NEGATIVE 01/12/2016 2046   Sepsis Labs: @LABRCNTIP (procalcitonin:4,lacticidven:4) )No results found for this or any previous visit (from the past 240 hour(s)).   Radiological Exams on Admission: Ct Abdomen Pelvis Wo Contrast  01/12/2016  CLINICAL DATA:  82 year old male with right-sided abdominal pain. EXAM: CT ABDOMEN AND PELVIS WITHOUT CONTRAST TECHNIQUE: Multidetector CT imaging of the abdomen and pelvis was performed following the standard protocol without IV contrast. COMPARISON:  Abdominal CT dated 05/01/2015 FINDINGS: Evaluation of this exam is limited in the absence of intravenous contrast. Linear atelectasis/scarring at the lung bases. There is coronary vascular calcification. Cardiac pacemaker wire noted. No intra-abdominal free air or free fluid. There multiple stones within the gallbladder. There is a stone at the neck of the gallbladder. There is no pericholecystic fluid. Ultrasound may provide better evaluation of the gallbladder. The liver, pancreas, spleen, and the adrenal glands appear unremarkable. There is moderate bilateral renal atrophy. There is mild current bilateral hydronephrosis, progressed compared to the prior study. Multiple bilateral  renal exophytic hypodense lesions are not characterized but may represent cysts. These lesions measure up to 4.2 cm in the inferior pole of the right kidney. There is apparent diffuse thickening of the bladder wall which may be partly related to underdistention. Cystitis is not excluded. Correlation with urinalysis recommended. The prostate and seminal vesicles are grossly unremarkable. Constipation. There is no evidence of bowel obstruction or active inflammation. The appendix is not visualized with certainty. There is advanced aortoiliac atherosclerotic disease. Evaluation of the vasculature is limited in the absence of intravenous contrast. There is retroperitoneal soft tissue density encasing in the abdominal aorta and the IVC. Findings may represent retroperitoneal fibrosis, or neoplastic processes such as metastatic disease, or lymphoma/ lymphoproliferative disease. There has been interval increase in the size of the retroperitoneal/periaortic soft tissue compared to the prior study. There is encasement of the common iliac arteries as well. The retroperitoneal soft tissue measures approximately 8.4 x 4.5 cm (previously 7.7 x 3.7 cm) at the level of the renal arteries. There are top-normal mesenteric lymph nodes. There is an area of stranding in the right lower quadrant which is similar or slightly increased from prior study. Midline vertical anterior abdominal wall incisional scar. There is degenerative changes of the spine. Lower lumbar laminectomy and posterior fusion hardware. No acute fracture. IMPRESSION: Progression of the retroperitoneal/ periaortic soft tissue with encasement of the aorta. 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 Moderate bilateral hydronephrosis, increased from prior study, and likely related to adhesion/compression of the proximal ureters by the retroperitoneal mass. Cholelithiasis. Constipation. No evidence of bowel  obstruction or active inflammation. Electronically Signed   By: Anner Crete M.D.   On: 01/12/2016 23:10   Dg Chest 2 View  01/12/2016  CLINICAL DATA:  Hypoxia, weakness and lethargy. Three falls in the past week. EXAM: CHEST  2 VIEW COMPARISON:  08/23/2015 FINDINGS: Sternotomy wires and left-sided pacemaker unchanged. Lungs are adequately inflated without focal consolidation or effusion. Stable borderline cardiomegaly. Surgical clips over the mediastinum, right neck and left supraclavicular region. Mild degenerative change of the  spine. IMPRESSION: No active cardiopulmonary disease. Electronically Signed   By: Marin Olp M.D.   On: 01/12/2016 20:37   Ct Head Wo Contrast  01/12/2016  CLINICAL DATA:  82 year old male with fall, altered mental status, and weakness. EXAM: CT HEAD WITHOUT CONTRAST TECHNIQUE: Contiguous axial images were obtained from the base of the skull through the vertex without intravenous contrast. COMPARISON:  Head CT dated 04/15/2015 FINDINGS: There is mild age-related atrophy and chronic microvascular ischemic changes appropriate for patient's age. There is no acute intracranial hemorrhage. No mass effect or midline shift noted. The visualized paranasal sinuses and mastoid air cells are clear. The calvarium is intact. IMPRESSION: No acute intracranial hemorrhage. Age-related atrophy and chronic microvascular ischemic disease. If symptoms persist and there are no contraindications, MRI may provide better evaluation if clinically indicated. Electronically Signed   By: Anner Crete M.D.   On: 01/12/2016 22:29    EKG: Independently reviewed. Atrial-paced, RBBB, non-specific repolarization abnormality in lateral leads  Assessment/Plan  1. Acute encephalopathy  - Uncertain etiology at this time; suspected toxic-metabolic in setting of electrolyte derangements and AKI  - Head CT negative for acute intracranial abnormality - Ammonia wnl - Plan to correct electrolyte  abnormalities and manage AKI as below; if fails to improve, may need to extend workup    2. AKI superimposed on CKD stage III  - SCr 2.34 on admission, up from 1.7 in May and 1.2 in February 2017   - Suspect a post-renal etiology secondary to extrinsic compression of bilateral ureters by retroperitoneal mass  - Providing a gentle IV hydration  - Avoiding nephrotoxins, holding diuretics tonight, consider resuming in am given recent vol o/l    3. Atrial fibrillation with supratherapeutic INR - CHADS-VASc at least 4 (age x2, HTN, CHF) - On AC with coumadin, INR 3.37 on admission, will hold dose, ask pharmacy to assist  - Rate-controlled with Toprol, will continue   4. Non-Hodgkin lymphoma, prostate cancer  - Followed by oncology with ongoing chemo for NHL; hormone therapy for prostate CA with recent increase in PSA per notes  - Retroperitoneal mass with interval enlargement noted on CT abd/pel  - Ongoing management per oncology team    5. OSA  - Managed with CPAP at home, will continue    6. Hypertension  - At goal at time of admission  - Managed with Toprol and diuretics at home - Continue Toprol, hold diuretics for now in setting of AKI    7. Normocytic anemia - Hgb 10.8 and stable  - No sign of active losses   8. Hypokalemia  - Serum potassium 2.7 on admission  - Suspected secondary to poor PO intake and loop diuretics - Given 40 mEq PO in ED  - K+ added to IVF  - Mag level wnl  - Repeat chem panel tomorrow   9. Chronic diastolic CHF  - TTE ([id number]) with EF 60-65%, moderate concentric hypertrophy, grade 1 diastolic dysfunction, mild LAE  - Managed with torsemide, metolazone, and metoprolol at home - Holding diuretics for now while gently hydrating  - Follow strict I/O's, daily wts; fluid-restrict diet, resume diuretics as appropriate    DVT prophylaxis: On coumadin  Code Status: DNR Family Communication: Wife and daughter updated at bedside  Disposition Plan: Observe  on med-surg   Consults called: None  Admission status: Observation     Vianne Bulls, MD [facility] Pager [phone]  If 7PM-7AM, please contact night-coverage [email or web address] Password [facility]  01/13/2016, 12:48 AM

## 2016-01-13 NOTE — Progress Notes (Signed)
IP PROGRESS NOTE  Subjective:   Edward Woodward was admitted yesterday with confusion. He had increased leg edema last week and the diuretic regimen was adjusted by Dr. Tamala [place]. His daughter wrapped his legs. The leg edema has improved. He had tremors in addition to the confusion yesterday. His mental status has partially improved today per his daughter and wife. Edward Woodward denies pain.  Objective: Vital signs in last 24 hours: Blood pressure 128/58, pulse 66, temperature 98.6 F (37 C), temperature source Oral, resp. rate 18, height 5\' 10"  (1.778 m), SpO2 98 %.  Intake/Output from previous day: 06/18 0701 - 06/19 0700 In: 65 [P.O.:355] Out: -   Physical Exam:  HEENT: Neck without mass Lungs: Rhonchi at the lung bases bilaterally, no respiratory distress Cardiac: Regular rate and rhythm Abdomen: Nontender, no mass Extremities: Trace edema at the left greater than right lower leg-improved Neurologic: Alert, oriented to place and year. Follows commands. The motor exam appears intact in the arms and legs. Finger to nose testing is normal.    Lab Results:  Recent Labs  01/12/16 1959 01/12/16 2009 01/13/16 0824  WBC 7.3  --  7.1  HGB 10.8* 11.2* 9.6*  HCT 31.9* 33.0* 28.9*  PLT 136*  --  126*    BMET  Recent Labs  01/12/16 1959 01/12/16 2009 01/13/16 0824  NA 139 138 140  K 2.7* 2.7* 2.8*  CL 94* 91* 96*  CO2 35*  --  34*  GLUCOSE 126* 121* 110*  BUN 55* 46* 53*  CREATININE 2.34* 2.30* 2.34*  CALCIUM 10.0  --  9.4    Studies/Results: Ct Abdomen Pelvis Wo Contrast  01/12/2016  CLINICAL DATA:  82 year old male with right-sided abdominal pain. EXAM: CT ABDOMEN AND PELVIS WITHOUT CONTRAST TECHNIQUE: Multidetector CT imaging of the abdomen and pelvis was performed following the standard protocol without IV contrast. COMPARISON:  Abdominal CT dated 05/01/2015 FINDINGS: Evaluation of this exam is limited in the absence of intravenous contrast. Linear atelectasis/scarring  at the lung bases. There is coronary vascular calcification. Cardiac pacemaker wire noted. No intra-abdominal free air or free fluid. There multiple stones within the gallbladder. There is a stone at the neck of the gallbladder. There is no pericholecystic fluid. Ultrasound may provide better evaluation of the gallbladder. The liver, pancreas, spleen, and the adrenal glands appear unremarkable. There is moderate bilateral renal atrophy. There is mild current bilateral hydronephrosis, progressed compared to the prior study. Multiple bilateral renal exophytic hypodense lesions are not characterized but may represent cysts. These lesions measure up to 4.2 cm in the inferior pole of the right kidney. There is apparent diffuse thickening of the bladder wall which may be partly related to underdistention. Cystitis is not excluded. Correlation with urinalysis recommended. The prostate and seminal vesicles are grossly unremarkable. Constipation. There is no evidence of bowel obstruction or active inflammation. The appendix is not visualized with certainty. There is advanced aortoiliac atherosclerotic disease. Evaluation of the vasculature is limited in the absence of intravenous contrast. There is retroperitoneal soft tissue density encasing in the abdominal aorta and the IVC. Findings may represent retroperitoneal fibrosis, or neoplastic processes such as metastatic disease, or lymphoma/ lymphoproliferative disease. There has been interval increase in the size of the retroperitoneal/periaortic soft tissue compared to the prior study. There is encasement of the common iliac arteries as well. The retroperitoneal soft tissue measures approximately 8.4 x 4.5 cm (previously 7.7 x 3.7 cm) at the level of the renal arteries. There are top-normal mesenteric lymph nodes.  There is an area of stranding in the right lower quadrant which is similar or slightly increased from prior study. Midline vertical anterior abdominal wall  incisional scar. There is degenerative changes of the spine. Lower lumbar laminectomy and posterior fusion hardware. No acute fracture. IMPRESSION: Progression of the retroperitoneal/ periaortic soft tissue with encasement of the aorta. 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 Moderate bilateral hydronephrosis, increased from prior study, and likely related to adhesion/compression of the proximal ureters by the retroperitoneal mass. Cholelithiasis. Constipation. No evidence of bowel obstruction or active inflammation. Electronically Signed   By: Anner Crete M.D.   On: 01/12/2016 23:10   Dg Chest 2 View  01/12/2016  CLINICAL DATA:  Hypoxia, weakness and lethargy. Three falls in the past week. EXAM: CHEST  2 VIEW COMPARISON:  08/23/2015 FINDINGS: Sternotomy wires and left-sided pacemaker unchanged. Lungs are adequately inflated without focal consolidation or effusion. Stable borderline cardiomegaly. Surgical clips over the mediastinum, right neck and left supraclavicular region. Mild degenerative change of the spine. IMPRESSION: No active cardiopulmonary disease. Electronically Signed   By: Marin Olp M.D.   On: 01/12/2016 20:37   Ct Head Wo Contrast  01/12/2016  CLINICAL DATA:  82 year old male with fall, altered mental status, and weakness. EXAM: CT HEAD WITHOUT CONTRAST TECHNIQUE: Contiguous axial images were obtained from the base of the skull through the vertex without intravenous contrast. COMPARISON:  Head CT dated 04/15/2015 FINDINGS: There is mild age-related atrophy and chronic microvascular ischemic changes appropriate for patient's age. There is no acute intracranial hemorrhage. No mass effect or midline shift noted. The visualized paranasal sinuses and mastoid air cells are clear. The calvarium is intact. IMPRESSION: No acute intracranial hemorrhage. Age-related atrophy and chronic microvascular ischemic disease. If symptoms  persist and there are no contraindications, MRI may provide better evaluation if clinically indicated. Electronically Signed   By: Anner Crete M.D.   On: 01/12/2016 22:29    Medications: I have reviewed the patient's current medications.  Assessment/Plan:  1. Non-Hodgkin's lymphoma (follicular grade 3 lymphoma) - status post 6 cycles of Cytoxan/prednisone/rituximab 07/01/2007 through 10/21/2007. He remains in clinical remission. 2. CT chest 04/01/2015 with no lymphadenopathy  CT abdomen/pelvis 05/01/2015 with mild progression of abdomen/pelvic lymphadenopathy 3. Prostate cancer - he has been treated with hormonal therapy, the PSA was stable on 10/15/2014  PSA increased 04/26/2015   Bone scan 05/09/2015 with unchanged increased activity in the thoracic and lumbar spine felt to most likely be degenerative.   Initiation of Abiraterone and prednisone 05/11/2015. Normalization of the PSA on Abiraterone. 4. History of a normocytic anemia secondary to non-Hodgkin's lymphoma, chemotherapy, and renal insufficiency - progressive secondary to GI bleeding, stable 5. History of Mild thrombocytopenia  6. History of bilateral hydronephrosis. Renal ultrasound 09/26/2014 consistent with medical renal disease. No hydronephrosis. 7. Coronary artery disease - status post coronary artery stent placement. 8. History of noncalcified lung nodules. 9. History of severe neutropenia July 2009 - likely related to delayed toxicity from rituximab. 10. Macular degeneration. 11. Right middle lobe density on a CT of the chest 06/18/2010 measuring 2.5 x 1.6 cm with mildly increased metabolic activity (SUV max 2.5) on a PET scan 06/27/2010. The area of increased density was less confluent and appeared smaller on a restaging CT 09/08/2010. Right middle lobe "scarring" with no new or suspicious pulmonary nodule on a CT 08/04/2011. 12. Mild increased metabolic activity associated with several lymph nodes on the PET  scan 06/27/2010 - likely related to lymphoma. No  lymphadenopathy in the mediastinum or axillary areas on the CT 08/04/2011. 13. Right hip discomfort-he received a steroid injection by his primary physician without improvement. He saw Dr. Collier Salina and there was a question of a lytic process in the right pelvis. A bone scan on 09/21/2012 revealed no evidence of metastatic disease. 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 Renal insufficiency-progressive CT 01/12/2016 confirmed progressive hydronephrosis 15. Left leg DVT and pulmonary embolism 04/01/2015-on Coumadin 16. Admission 08/23/2015 with severe anemia and GI bleeding  Upper and lower endoscopy 08/26/2015 without a clear source for bleeding identified 17. Leg edema/anasarca-improved with leg wrapping and diuretics 18. Altered mental status 19. CT 01/12/2016 with progressive soft tissue in the retroperitoneum surrounding the aorta  Edward Woodward has a complex medical history. He is admitted with altered mental status. This may be related to renal failure. I have a low clinical suspicion for metastatic disease involving the CNS. He does not appear to have an active infection.  I discussed the CT findings and reviewed the images with his family. The retroperitoneal soft tissue may represent progression of lymphoma or retroperitoneal fibrosis. I have a low suspicion for progression of the prostate cancer with the continued decline in the PSA while on abiraterone.  I reviewed the CT images in radiology. The reticulocyte peritoneal soft tissue can be biopsied with a small risk of hemorrhage.  I do not favor a biopsy of the soft tissue at present. I will discuss this further with his family tomorrow.  Recommendations: 1. Consult urology for placement of a ureter stents 2. Continue abiraterone 3. I will discuss indications for a PET scan and biopsy of the retroperitoneal soft tissue with the patient and his family on  01/12/2016.       [name], MD   01/13/2016, 5:06 PM

## 2016-01-13 NOTE — Progress Notes (Signed)
Patient has home CPAP machine and equipment. Patient is tolerating well. Cords are intact. RT will call [employer] for cord check in the am

## 2016-01-13 NOTE — Progress Notes (Signed)
ANTICOAGULATION CONSULT NOTE - Initial Consult  Pharmacy Consult for Warfarin Indication: DVT/PE (diagnosed Sept 2016)  Allergies  Allergen Reactions  . Penicillins Swelling  . Simvastatin Other (See Comments)    Muscle weakness in legs ?    Patient Measurements: Height: 5\' 10"  (177.8 cm) IBW/kg (Calculated) : 73  Vital Signs: Temp: 98.1 F (36.7 C) (06/19 0508) Temp Source: Oral (06/19 0508) BP: 113/61 mmHg (06/19 0508) Pulse Rate: 65 (06/19 0508)  Labs:  Recent Labs  01/12/16 1959 01/12/16 2009 01/13/16 0346  HGB 10.8* 11.2*  --   HCT 31.9* 33.0*  --   PLT 136*  --   --   LABPROT 32.4*  --  32.6*  INR 3.37*  --  3.40*  CREATININE 2.34* 2.30*  --     Estimated Creatinine Clearance: 28 mL/min (by C-G formula based on Cr of 2.3).   Medical History: Past Medical History  Diagnosis Date  . Prostate cancer ([place])   . Neuropathy ([place])   . Hypercholesteremia   . Depressive disorder, not elsewhere classified   . Sinoatrial node dysfunction (HCC)   . Degeneration of lumbar or lumbosacral intervertebral disc   . CAD (coronary artery disease)      NYHA Class 1B, CABG 1985  . HTN (hypertension)   . OSA on CPAP   . Collagen vascular disease ([place])   . PE (pulmonary embolism) 03/2015  . Neuromuscular disorder (HCC)     neuropathy  . GERD (gastroesophageal reflux disease)   . Presence of permanent cardiac pacemaker     Medications:  Scheduled:  . abiraterone Acetate  1,000 mg Oral Daily  . atorvastatin  40 mg Oral Daily  . cholecalciferol  4,000 Units Oral Daily  . citalopram  20 mg Oral Daily  . docusate sodium  100 mg Oral BID  . folic acid  1 mg Oral Daily  . gabapentin  300 mg Oral BID  . isosorbide mononitrate  30 mg Oral Daily  . metoprolol succinate  50 mg Oral Daily  . multivitamin with minerals  1 tablet Oral Daily  . pantoprazole  40 mg Oral BID AC  . polyvinyl alcohol  1 drop Both Eyes QHS  . predniSONE  2.5 mg Oral Q breakfast  . [START ON  01/14/2016] torsemide  20 mg Oral BID  . vitamin B-12  1,000 mcg Oral Daily  . Warfarin - Pharmacist Dosing Inpatient   Does not apply q1800   Infusions:  . 0.9 % NaCl with KCl 40 mEq / L 75 mL/hr (01/13/16 0318)    Assessment: 82 yr male brought to ED with lethargy and reports of frequent falls at home this week.  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  PTA warfarin 5mg  daily except 2.5mg  MWF - last dose taken at home on 01/12/16, INR on admission 3.37 (supratherapeutic)  Current INR = 3.40 (supratherapeutic)  Goal of Therapy:  INR 2-3   Plan:   Hold warfarin for tonight  Check daily INR   Darl Pikes, PharmD 01/13/2016,8:12 AM

## 2016-01-13 NOTE — Progress Notes (Signed)
ANTICOAGULATION CONSULT NOTE - Initial Consult  Pharmacy Consult for Warfarin Indication: DVT/PE (diagnosed Sept 2016)  Allergies  Allergen Reactions  . Penicillins Swelling  . Simvastatin Other (See Comments)    Muscle weakness in legs ?    Patient Measurements:    Vital Signs: Temp: 97.9 F (36.6 C) (06/18 2356) Temp Source: Oral (06/18 2356) BP: 131/83 mmHg (06/18 2356) Pulse Rate: 65 (06/18 2356)  Labs:  Recent Labs  01/12/16 1959 01/12/16 2009  HGB 10.8* 11.2*  HCT 31.9* 33.0*  PLT 136*  --   LABPROT 32.4*  --   INR 3.37*  --   CREATININE 2.34* 2.30*    Estimated Creatinine Clearance: 28 mL/min (by C-G formula based on Cr of 2.3).   Medical History: Past Medical History  Diagnosis Date  . Prostate cancer ([place])   . Neuropathy ([place])   . Hypercholesteremia   . Depressive disorder, not elsewhere classified   . Sinoatrial node dysfunction (HCC)   . Degeneration of lumbar or lumbosacral intervertebral disc   . CAD (coronary artery disease)      NYHA Class 1B, CABG 1985  . HTN (hypertension)   . OSA on CPAP   . Collagen vascular disease ([place])   . PE (pulmonary embolism) 03/2015  . Neuromuscular disorder (HCC)     neuropathy  . GERD (gastroesophageal reflux disease)   . Presence of permanent cardiac pacemaker     Medications:  Scheduled:  . abiraterone Acetate  1,000 mg Oral Daily  . atorvastatin  40 mg Oral Daily  . cholecalciferol  4,000 Units Oral Daily  . citalopram  20 mg Oral Daily  . docusate sodium  100 mg Oral BID  . folic acid  1 mg Oral Daily  . gabapentin  300 mg Oral BID  . isosorbide mononitrate  30 mg Oral Daily  . metoprolol succinate  50 mg Oral Daily  . multivitamin with minerals  1 tablet Oral Daily  . pantoprazole  40 mg Oral BID AC  . polyvinyl alcohol  1 drop Both Eyes QHS  . predniSONE  2.5 mg Oral Q breakfast  . torsemide  20 mg Oral BID  . vitamin B-12  1,000 mcg Oral Daily  . Warfarin - Pharmacist Dosing Inpatient    Does not apply q1800   Infusions:  . 0.9 % NaCl with KCl 40 mEq / L      Assessment: 82 yr male brought to ED with lethargy and reports of frequent falls at home this week.  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  PTA warfarin 5mg  daily except 2.5mg  MWF - last dose taken on 01/12/16  INR (6/18) = 3.37  Pharmacy consulted to dose warfarin  Goal of Therapy:  INR 2-3   Plan:   No additional warfarin tonight (patient has taken dose on 6/18 and INR supratherapeutic)  Check daily INR   Jancie Kercher, Toribio Harbour, PharmD 01/13/2016,1:14 AM

## 2016-01-14 ENCOUNTER — Telehealth: Payer: Self-pay | Admitting: Oncology

## 2016-01-14 DIAGNOSIS — C61 Malignant neoplasm of prostate: Secondary | ICD-10-CM

## 2016-01-14 DIAGNOSIS — R531 Weakness: Secondary | ICD-10-CM

## 2016-01-14 DIAGNOSIS — I10 Essential (primary) hypertension: Secondary | ICD-10-CM

## 2016-01-14 DIAGNOSIS — W19XXXA Unspecified fall, initial encounter: Secondary | ICD-10-CM

## 2016-01-14 DIAGNOSIS — R1900 Intra-abdominal and pelvic swelling, mass and lump, unspecified site: Secondary | ICD-10-CM

## 2016-01-14 DIAGNOSIS — N1330 Unspecified hydronephrosis: Secondary | ICD-10-CM

## 2016-01-14 DIAGNOSIS — E876 Hypokalemia: Secondary | ICD-10-CM

## 2016-01-14 DIAGNOSIS — R791 Abnormal coagulation profile: Secondary | ICD-10-CM

## 2016-01-14 DIAGNOSIS — I4891 Unspecified atrial fibrillation: Secondary | ICD-10-CM

## 2016-01-14 DIAGNOSIS — N179 Acute kidney failure, unspecified: Principal | ICD-10-CM

## 2016-01-14 DIAGNOSIS — N183 Chronic kidney disease, stage 3 (moderate): Secondary | ICD-10-CM

## 2016-01-14 LAB — GLUCOSE, CAPILLARY: Glucose-Capillary: 112 mg/dL — ABNORMAL HIGH (ref 65–99)

## 2016-01-14 LAB — PROTIME-INR
INR: 3.05 — ABNORMAL HIGH (ref 0.00–1.49)
Prothrombin Time: 30.1 seconds — ABNORMAL HIGH (ref 11.6–15.2)

## 2016-01-14 LAB — CBC
HEMATOCRIT: 30.4 % — AB (ref 39.0–52.0)
HEMOGLOBIN: 10 g/dL — AB (ref 13.0–17.0)
MCH: 31.3 pg (ref 26.0–34.0)
MCHC: 32.9 g/dL (ref 30.0–36.0)
MCV: 95 fL (ref 78.0–100.0)
Platelets: 131 10*3/uL — ABNORMAL LOW (ref 150–400)
RBC: 3.2 MIL/uL — ABNORMAL LOW (ref 4.22–5.81)
RDW: 13.6 % (ref 11.5–15.5)
WBC: 8 10*3/uL (ref 4.0–10.5)

## 2016-01-14 LAB — BASIC METABOLIC PANEL
Anion gap: 8 (ref 5–15)
BUN: 53 mg/dL — AB (ref 6–20)
CHLORIDE: 100 mmol/L — AB (ref 101–111)
CO2: 32 mmol/L (ref 22–32)
CREATININE: 2.07 mg/dL — AB (ref 0.61–1.24)
Calcium: 9.2 mg/dL (ref 8.9–10.3)
GFR calc Af Amer: 32 mL/min — ABNORMAL LOW (ref >60)
GFR calc non Af Amer: 27 mL/min — ABNORMAL LOW (ref >60)
GLUCOSE: 112 mg/dL — AB (ref 65–99)
Potassium: 3.6 mmol/L (ref 3.5–5.1)
Sodium: 140 mmol/L (ref 135–145)

## 2016-01-14 MED ORDER — PHENOL 1.4 % MT LIQD
1.0000 | OROMUCOSAL | Status: DC | PRN
  Filled 2016-01-14 (×2): qty 177

## 2016-01-14 NOTE — NC FL2 (Signed)
[place] LEVEL OF CARE SCREENING TOOL     IDENTIFICATION  Patient Name: Edward Woodward Birthdate: 1927-03-16 Sex: male Admission Date (Current Location): 01/12/2016  [facility] and [place] Number:  Herbalist and Address:  [facility],  [place] [address], [place]      Provider Number: [id number]  Attending Physician Name and Address:  Cristal Ford, DO  Relative Name and Phone Number:       Current Level of Care: Hospital Recommended Level of Care: [place] Prior Approval Number:    Date Approved/Denied:   PASRR Number:    Discharge Plan: SNF    Current Diagnoses: Patient Active Problem List   Diagnosis Date Noted  . Supratherapeutic INR 01/13/2016  . Encephalopathy acute 01/13/2016  . Falls 01/12/2016  . Failure to thrive in adult 01/12/2016  . Retroperitoneal mass 01/12/2016  . Hydronephrosis 01/12/2016  . AKI (acute kidney injury) ([place]) 01/12/2016  . Weakness 01/12/2016  . Dehydration 12/19/2015  . Long term current use of anticoagulant therapy 12/19/2015  . Hypokalemia 12/19/2015  . Peripheral edema 12/19/2015  . Rectal bleeding 12/19/2015  . Prostate cancer ([place]) 12/19/2015  . Chronic diastolic heart failure ([place]) 08/24/2015  . Pain in the chest   . NSTEMI (non-ST elevated myocardial infarction) ([place])   . Iron deficiency anemia due to chronic blood loss   . Chest pain 08/23/2015  . Encounter for chemotherapy management 05/06/2015  . Pulmonary embolus ([place]) 04/01/2015  . CKD (chronic kidney disease), stage III 04/01/2015  . Chronic anemia 04/01/2015  . Acute respiratory failure with hypoxia ([place]) 04/01/2015  . Sinus node dysfunction chronotropic incompetence 08/23/2013  . Cardiac pacemaker -[employer] 08/23/2013  . Atrial fibrillation ([place]) 08/23/2013  . Fall 07/11/2013  . Dyspnea 07/10/2013  . OSA on CPAP 07/10/2013  . Peripheral vascular disease ([place]) 06/07/2013  .  Atherosclerosis of coronary artery bypass graft of native heart without angina pectoris 06/07/2013  . Hyperlipidemia 06/07/2013  . Essential hypertension, benign 06/07/2013  . Other malignant lymphomas, unspecified site, extranodal and solid organ sites 07/03/2011    Orientation RESPIRATION BLADDER Height & Weight     Place, Self  O2 Incontinent Weight: 103.7 kg (228 lb 9.9 oz) Height:  5\' 10"  (177.8 cm)  BEHAVIORAL SYMPTOMS/MOOD NEUROLOGICAL BOWEL NUTRITION STATUS  Other (Comment) (no behaviors)   Continent Diet  AMBULATORY STATUS COMMUNICATION OF NEEDS Skin   Extensive Assist Verbally Other (Comment) (skin tears, arm, leg)                       Personal Care Assistance Level of Assistance  Bathing, Feeding, Dressing Bathing Assistance: Maximum assistance Feeding assistance: Limited assistance Dressing Assistance: Maximum assistance     Functional Limitations Info  Sight, Hearing, Speech Sight Info: Adequate Hearing Info: Adequate Speech Info: Adequate    SPECIAL CARE FACTORS FREQUENCY  PT (By licensed PT), OT (By licensed OT)     PT Frequency: 5 x wk OT Frequency: 5 x wk            Contractures Contractures Info: Not present    Additional Factors Info  Code Status, Psychotropic Code Status Info: DNR   Psychotropic Info: celexa          Current Medications (01/14/2016):  This is the current hospital active medication list Current Facility-Administered Medications  Medication Dose Route Frequency Provider Last Rate Last Dose  . 0.9 % NaCl with KCl 40 mEq / L  infusion   Intravenous Continuous Maryann Mikhail, DO 75 mL/hr at 01/14/16 0358 75 mL/hr at 01/14/16 0358  . abiraterone Acetate (ZYTIGA) tablet 1,000 mg  1,000 mg Oral Daily Vianne Bulls, MD   1,000 mg at 01/14/16 1128  . acetaminophen (TYLENOL) tablet 650 mg  650 mg Oral Q6H PRN Vianne Bulls, MD       Or  . acetaminophen (TYLENOL) suppository 650 mg  650 mg Rectal Q6H PRN Vianne Bulls, MD       . Derrill Memo ON 01/15/2016] atorvastatin (LIPITOR) tablet 40 mg  40 mg Oral QODAY Maryann Mikhail, DO      . benzocaine (ORAJEL) 10 % mucosal gel   Mouth/Throat QID PRN Maryann Mikhail, DO      . bisacodyl (DULCOLAX) EC tablet 5 mg  5 mg Oral Daily PRN Vianne Bulls, MD      . cholecalciferol (VITAMIN D) tablet 4,000 Units  4,000 Units Oral Daily Vianne Bulls, MD   4,000 Units at 01/14/16 1034  . citalopram (CELEXA) tablet 20 mg  20 mg Oral Daily Vianne Bulls, MD   20 mg at 01/14/16 1033  . docusate sodium (COLACE) capsule 100 mg  100 mg Oral BID Vianne Bulls, MD   100 mg at 01/14/16 1033  . folic acid (FOLVITE) tablet 1 mg  1 mg Oral Daily Vianne Bulls, MD   1 mg at 01/14/16 1033  . gabapentin (NEURONTIN) capsule 300 mg  300 mg Oral BID Vianne Bulls, MD   300 mg at 01/14/16 1034  . HYDROcodone-acetaminophen (NORCO/VICODIN) 5-325 MG per tablet 1-2 tablet  1-2 tablet Oral Q4H PRN Vianne Bulls, MD      . isosorbide mononitrate (IMDUR) 24 hr tablet 30 mg  30 mg Oral Daily Maryann Mikhail, DO   30 mg at 01/14/16 1034  . metoprolol succinate (TOPROL-XL) 24 hr tablet 50 mg  50 mg Oral Daily Maryann Mikhail, DO   50 mg at 01/14/16 1033  . multivitamin with minerals tablet 1 tablet  1 tablet Oral Daily Vianne Bulls, MD   1 tablet at 01/14/16 1034  . nitroGLYCERIN (NITROLINGUAL) 0.4 MG/SPRAY spray 1 spray  1 spray Sublingual Q5 Min x 3 PRN Ilene Qua Opyd, MD      . ondansetron (ZOFRAN) tablet 4 mg  4 mg Oral Q6H PRN Vianne Bulls, MD       Or  . ondansetron (ZOFRAN) injection 4 mg  4 mg Intravenous Q6H PRN Vianne Bulls, MD      . pantoprazole (PROTONIX) EC tablet 40 mg  40 mg Oral BID AC Maryann Mikhail, DO   40 mg at 01/14/16 0802  . phenol (CHLORASEPTIC) mouth spray 1 spray  1 spray Mouth/Throat PRN Maryann Mikhail, DO      . polyethylene glycol (MIRALAX / GLYCOLAX) packet 17 g  17 g Oral Daily PRN Vianne Bulls, MD      . polyvinyl alcohol (LIQUIFILM TEARS) 1.4 % ophthalmic solution 1  drop  1 drop Both Eyes QHS Vianne Bulls, MD   1 drop at 01/13/16 2154  . predniSONE (DELTASONE) tablet 2.5 mg  2.5 mg Oral Q breakfast Vianne Bulls, MD   2.5 mg at 01/14/16 0802  . torsemide (DEMADEX) tablet 20 mg  20 mg Oral BID Vianne Bulls, MD   20 mg at 01/14/16 1034  . vitamin B-12 (CYANOCOBALAMIN) tablet 1,000 mcg  1,000 mcg Oral Daily Timothy S Opyd,  MD   1,000 mcg at 01/14/16 1128  . Warfarin - Pharmacist Dosing Inpatient   Does not apply [id number] Vianne Bulls, MD         Discharge Medications: Please see discharge summary for a list of discharge medications.  Relevant Imaging Results:  Relevant Lab Results:   Additional Information SS # [id number]. Urology consulted. Pt may require ureter stents. Pt taking abiraterone for cancer. Pt uses CPAP.  Teea Ducey, Randall An, LCSW

## 2016-01-14 NOTE — Clinical Documentation Improvement (Signed)
Internal Medicine  Can the diagnosis of Atrial Fibrillation be further specified? Please document response in next progress note. Thank you!   Chronic Atrial fibrillation  Paroxysmal Atrial fibrillation  Permanent Atrial fibrillation  Persistent Atrial fibrillation  Other  Clinically Undetermined  Document any associated diagnoses/conditions.  Supporting Information:  "Continue on Coumadin per pharmacy; continue Metoprolol"  Please exercise your independent, professional judgment when responding. A specific answer is not anticipated or expected.  Thank You,  Zoila Shutter RN, BSN, [employer] [phone]; Cell: [phone]

## 2016-01-14 NOTE — Telephone Encounter (Signed)
Apt cancelled pt in hospital per MD per myrtle

## 2016-01-14 NOTE — Progress Notes (Signed)
PROGRESS NOTE    Edward Woodward  [id number] DOB: [birth year] DOA: 01/12/2016 PCP: Mathews Argyle, MD   Brief Narrative:  HPI on 01/13/2016 by Dr. Christia Reading Opyd Edward Woodward is a 82 y.o. male with medical history significant for prostate cancer, non-Hodgkin's lymphoma on chemotherapy, depression, CAD status post CABG, atrial fibrillation on Coumadin, and sinus node dysfunction with pacer who presents to the ED with worsening lethargy, confusion, and falls. Patient has reportedly been in declining health for quite some time with decreasing appetite and increasing weakness. This has been significantly amplified in recent days. Several days ago, he was ambulating by himself, feeding himself, and cogent. Now, he is brought in by his wife and daughter with lethargy, inability to ambulate even with assistance, severe upper extremity tremor, confusion, and falls. Patient is on Coumadin, but denies striking his head during any of these falls or losing consciousness. He has some superficial abrasions on the lower extremities, but denies any other significant injury or pain. Patient denies any recent fevers, chills, chest pain, palpitations, dyspnea, or cough. He denies abdominal pain, nausea, vomiting, or diarrhea.   Assessment & Plan   Acute encephalopathy  -Uncertain etiology at this time; suspected toxic-metabolic in setting of electrolyte derangements and AKI  -Appears to be improving slowly -Head CT negative for acute intracranial abnormality -Ammonia normal -UA and CXR unremarkable for infection -Patient currently afebrile with no leukocytosis  AKI superimposed on CKD stage III  -Cr 2.34 on admission, up from 1.7 in May and 1.2 in February 2017  -Suspect a post-renal etiology secondary to extrinsic compression of bilateral ureters by retroperitoneal mass  -Creatinine improving, 2.07 today -Providing a gentle IV hydration  -Avoiding nephrotoxins, holding diuretics  -Monitor  intake/output  Moderate hydronephrosis -Noted on CT abd/pelvis, increased from prior study. Likely related to adhesion/compression of proximal ureters by retroperitoneal mass -Urology consulted and appreciated. Patient was seeing Dr. Rosana Hoes in the past.   Atrial fibrillation with supratherapeutic INR -CHADS-VASc at least 4 (age x2, HTN, CHF) -On AC with coumadin, INR 3.37 on admission -INR 3.05 today -Continue coumadin per pharmacy -Continue metoprolol   Non-Hodgkin lymphoma, prostate cancer  -Followed by oncology with ongoing chemo for NHL; hormone therapy for prostate CA with recent increase in PSA per notes  -Retroperitoneal mass with interval enlargement noted on CT abd/pel  -Ongoing management per oncology team- Dr. Benay Spice made aware of patient's admission  OSA  -Continue CPAP QHS  Hypertension  -Continue Toprol, hold diuretics for now in setting of AKI   Normocytic anemia -Hemoglobin 10.8 and stable  -No sign of active losses  -Continue to monitor CBC  Hypokalemia  -resolved, potassium 3.6 today -Serum potassium 2.7 on admission  -Suspected secondary to poor PO intake and loop diuretics -Continue to replace and montior BMP -Magnesium 2  Chronic diastolic CHF  -TTE (123XX123) with EF 60-65%, moderate concentric hypertrophy, grade 1 diastolic dysfunction, mild LAE  -Managed with torsemide, metolazone, and metoprolol at home -Holding diuretics for now while gently hydrating  -Follow strict I/O's, daily wts; fluid-restrict diet, resume diuretics as appropriate   DVT Prophylaxis  Coumadin  Code Status: DNR  Family Communication: Wife at bedside  Disposition Plan: Admitted. Pending improvement in mental status and pending Urology consult  Consultants Oncology Urology  Procedures  None  Antibiotics   Anti-infectives    None      Subjective:   Edward Woodward seen and examined today.  Patient has no complaints. Denies chest pain, shortness of  breath,  abdominal pain, dizziness, headache.   Objective:   Filed Vitals:   01/13/16 2056 01/14/16 0630 01/14/16 0649 01/14/16 1033  BP: 119/52 135/54  135/56  Pulse: 65 65  66  Temp: 98 F (36.7 C) 99 F (37.2 C)    TempSrc: Oral Oral    Resp: 16 16    Height:   5\' 10"  (1.778 m)   Weight:   103.7 kg (228 lb 9.9 oz)   SpO2: 100% 100%      Intake/Output Summary (Last 24 hours) at 01/14/16 1043 Last data filed at 01/14/16 0900  Gross per 24 hour  Intake 2355.5 ml  Output   1175 ml  Net 1180.5 ml   Filed Weights   01/14/16 0649  Weight: 103.7 kg (228 lb 9.9 oz)    Exam  General: Well developed, well nourished, No distress  HEENT: NCAT,mucous membranes moist.   Cardiovascular: S1 S2 auscultated,  2/6 SEM, RRR  Respiratory: Clear to auscultation bilaterally  Abdomen: Soft, nontender, nondistended, + bowel sounds  Extremities: warm dry without cyanosis clubbing. LE edema.   Neuro: AAOx 3 (place, person, time), nonfocal  Psych: Normal affect and demeanor, pleasant   Data Reviewed: I have personally reviewed following labs and imaging studies  CBC:  Recent Labs Lab 01/12/16 1959 01/12/16 2009 01/13/16 0824 01/14/16 0405  WBC 7.3  --  7.1 8.0  NEUTROABS 5.1  --   --   --   HGB 10.8* 11.2* 9.6* 10.0*  HCT 31.9* 33.0* 28.9* 30.4*  MCV 92.2  --  94.8 95.0  PLT 136*  --  126* [id number]*   Basic Metabolic Panel:  Recent Labs Lab 01/09/16 1306 01/12/16 1959 01/12/16 2009 01/13/16 0824 01/14/16 0405  NA 140 139 138 140 140  K 3.6 2.7* 2.7* 2.8* 3.6  CL 96* 94* 91* 96* 100*  CO2 32* 35*  --  34* 32  GLUCOSE 127* 126* 121* 110* 112*  BUN 48* 55* 46* 53* 53*  CREATININE 2.23* 2.34* 2.30* 2.34* 2.07*  CALCIUM 10.0 10.0  --  9.4 9.2  MG  --  2.0  --   --   --    GFR: Estimated Creatinine Clearance: 30.9 mL/min (by C-G formula based on Cr of 2.07). Liver Function Tests:  Recent Labs Lab 01/12/16 1959  AST 22  ALT 14*  ALKPHOS 69  BILITOT 0.9  PROT  6.7  ALBUMIN 4.0   No results for input(s): LIPASE, AMYLASE in the last 168 hours.  Recent Labs Lab 01/12/16 1959  AMMONIA 17   Coagulation Profile:  Recent Labs Lab 01/12/16 1959 01/13/16 0346 01/14/16 0405  INR 3.37* 3.40* 3.05*   Cardiac Enzymes: No results for input(s): CKTOTAL, CKMB, CKMBINDEX, TROPONINI in the last 168 hours. BNP (last 3 results) No results for input(s): PROBNP in the last 8760 hours. HbA1C: No results for input(s): HGBA1C in the last 72 hours. CBG:  Recent Labs Lab 01/12/16 1949 01/14/16 0808  GLUCAP 122* 112*   Lipid Profile: No results for input(s): CHOL, HDL, LDLCALC, TRIG, CHOLHDL, LDLDIRECT in the last 72 hours. Thyroid Function Tests: No results for input(s): TSH, T4TOTAL, FREET4, T3FREE, THYROIDAB in the last 72 hours. Anemia Panel: No results for input(s): VITAMINB12, FOLATE, FERRITIN, TIBC, IRON, RETICCTPCT in the last 72 hours. Urine analysis:    Component Value Date/Time   COLORURINE YELLOW 01/12/2016 2046   APPEARANCEUR CLEAR 01/12/2016 2046   LABSPEC 1.010 01/12/2016 2046   PHURINE 5.5 01/12/2016 2046   GLUCOSEU NEGATIVE 01/12/2016  2046   HGBUR NEGATIVE 01/12/2016 2046   BILIRUBINUR NEGATIVE 01/12/2016 2046   [place] NEGATIVE 01/12/2016 2046   PROTEINUR NEGATIVE 01/12/2016 2046   NITRITE NEGATIVE 01/12/2016 2046   LEUKOCYTESUR NEGATIVE 01/12/2016 2046   Sepsis Labs: @LABRCNTIP (procalcitonin:4,lacticidven:4)  )No results found for this or any previous visit (from the past 240 hour(s)).    Radiology Studies: Ct Abdomen Pelvis Wo Contrast  01/12/2016  CLINICAL DATA:  82 year old male with right-sided abdominal pain. EXAM: CT ABDOMEN AND PELVIS WITHOUT CONTRAST TECHNIQUE: Multidetector CT imaging of the abdomen and pelvis was performed following the standard protocol without IV contrast. COMPARISON:  Abdominal CT dated 05/01/2015 FINDINGS: Evaluation of this exam is limited in the absence of intravenous contrast. Linear  atelectasis/scarring at the lung bases. There is coronary vascular calcification. Cardiac pacemaker wire noted. No intra-abdominal free air or free fluid. There multiple stones within the gallbladder. There is a stone at the neck of the gallbladder. There is no pericholecystic fluid. Ultrasound may provide better evaluation of the gallbladder. The liver, pancreas, spleen, and the adrenal glands appear unremarkable. There is moderate bilateral renal atrophy. There is mild current bilateral hydronephrosis, progressed compared to the prior study. Multiple bilateral renal exophytic hypodense lesions are not characterized but may represent cysts. These lesions measure up to 4.2 cm in the inferior pole of the right kidney. There is apparent diffuse thickening of the bladder wall which may be partly related to underdistention. Cystitis is not excluded. Correlation with urinalysis recommended. The prostate and seminal vesicles are grossly unremarkable. Constipation. There is no evidence of bowel obstruction or active inflammation. The appendix is not visualized with certainty. There is advanced aortoiliac atherosclerotic disease. Evaluation of the vasculature is limited in the absence of intravenous contrast. There is retroperitoneal soft tissue density encasing in the abdominal aorta and the IVC. Findings may represent retroperitoneal fibrosis, or neoplastic processes such as metastatic disease, or lymphoma/ lymphoproliferative disease. There has been interval increase in the size of the retroperitoneal/periaortic soft tissue compared to the prior study. There is encasement of the common iliac arteries as well. The retroperitoneal soft tissue measures approximately 8.4 x 4.5 cm (previously 7.7 x 3.7 cm) at the level of the renal arteries. There are top-normal mesenteric lymph nodes. There is an area of stranding in the right lower quadrant which is similar or slightly increased from prior study. Midline vertical anterior  abdominal wall incisional scar. There is degenerative changes of the spine. Lower lumbar laminectomy and posterior fusion hardware. No acute fracture. IMPRESSION: Progression of the retroperitoneal/ periaortic soft tissue with encasement of the aorta. 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 Moderate bilateral hydronephrosis, increased from prior study, and likely related to adhesion/compression of the proximal ureters by the retroperitoneal mass. Cholelithiasis. Constipation. No evidence of bowel obstruction or active inflammation. Electronically Signed   By: Anner Crete M.D.   On: 01/12/2016 23:10   Dg Chest 2 View  01/12/2016  CLINICAL DATA:  Hypoxia, weakness and lethargy. Three falls in the past week. EXAM: CHEST  2 VIEW COMPARISON:  08/23/2015 FINDINGS: Sternotomy wires and left-sided pacemaker unchanged. Lungs are adequately inflated without focal consolidation or effusion. Stable borderline cardiomegaly. Surgical clips over the mediastinum, right neck and left supraclavicular region. Mild degenerative change of the spine. IMPRESSION: No active cardiopulmonary disease. Electronically Signed   By: Marin Olp M.D.   On: 01/12/2016 20:37   Ct Head Wo Contrast  01/12/2016  CLINICAL DATA:  82 year old male with fall, altered mental status,  and weakness. EXAM: CT HEAD WITHOUT CONTRAST TECHNIQUE: Contiguous axial images were obtained from the base of the skull through the vertex without intravenous contrast. COMPARISON:  Head CT dated 04/15/2015 FINDINGS: There is mild age-related atrophy and chronic microvascular ischemic changes appropriate for patient's age. There is no acute intracranial hemorrhage. No mass effect or midline shift noted. The visualized paranasal sinuses and mastoid air cells are clear. The calvarium is intact. IMPRESSION: No acute intracranial hemorrhage. Age-related atrophy and chronic microvascular ischemic disease. If  symptoms persist and there are no contraindications, MRI may provide better evaluation if clinically indicated. Electronically Signed   By: Anner Crete M.D.   On: 01/12/2016 22:29     Scheduled Meds: . abiraterone Acetate  1,000 mg Oral Daily  . [START ON 01/15/2016] atorvastatin  40 mg Oral QODAY  . cholecalciferol  4,000 Units Oral Daily  . citalopram  20 mg Oral Daily  . docusate sodium  100 mg Oral BID  . folic acid  1 mg Oral Daily  . gabapentin  300 mg Oral BID  . isosorbide mononitrate  30 mg Oral Daily  . metoprolol succinate  50 mg Oral Daily  . multivitamin with minerals  1 tablet Oral Daily  . pantoprazole  40 mg Oral BID AC  . polyvinyl alcohol  1 drop Both Eyes QHS  . predniSONE  2.5 mg Oral Q breakfast  . torsemide  20 mg Oral BID  . vitamin B-12  1,000 mcg Oral Daily  . Warfarin - Pharmacist Dosing Inpatient   Does not apply q1800   Continuous Infusions: . 0.9 % NaCl with KCl 40 mEq / L 75 mL/hr (01/14/16 0358)     LOS: 1 day   Time Spent in minutes   30 minutes  Kenyette Gundy D.O. on 01/14/2016 at 10:43 AM  Between 7am to 7pm - Pager - [phone]  After 7pm go to [email or web address] - password [email or web address]  And look for the night coverage person covering for me after hours  [facility] Office  [phone]

## 2016-01-14 NOTE — Progress Notes (Signed)
ANTICOAGULATION CONSULT NOTE - Initial Consult  Pharmacy Consult for Warfarin Indication: DVT/PE (diagnosed Sept 2016)  Allergies  Allergen Reactions  . Penicillins Swelling  . Simvastatin Other (See Comments)    Muscle weakness in legs ?    Patient Measurements: Height: 5\' 10"  (177.8 cm) Weight: 228 lb 9.9 oz (103.7 kg) IBW/kg (Calculated) : 73  Vital Signs: Temp: 99 F (37.2 C) (06/20 0630) Temp Source: Oral (06/20 0630) BP: 135/54 mmHg (06/20 0630) Pulse Rate: 65 (06/20 0630)  Labs:  Recent Labs  01/12/16 1959 01/12/16 2009 01/13/16 0346 01/13/16 0824 01/14/16 0405  HGB 10.8* 11.2*  --  9.6* 10.0*  HCT 31.9* 33.0*  --  28.9* 30.4*  PLT 136*  --   --  126* 131*  LABPROT 32.4*  --  32.6*  --  30.1*  INR 3.37*  --  3.40*  --  3.05*  CREATININE 2.34* 2.30*  --  2.34* 2.07*    Estimated Creatinine Clearance: 30.9 mL/min (by C-G formula based on Cr of 2.07).   Medical History: Past Medical History  Diagnosis Date  . Prostate cancer ([place])   . Neuropathy ([place])   . Hypercholesteremia   . Depressive disorder, not elsewhere classified   . Sinoatrial node dysfunction ([facility])   . Degeneration of lumbar or lumbosacral intervertebral disc   . CAD (coronary artery disease)      NYHA Class 1B, CABG 1985  . HTN (hypertension)   . OSA on CPAP   . Collagen vascular disease ([place])   . PE (pulmonary embolism) 03/2015  . Neuromuscular disorder ([facility])     neuropathy  . GERD (gastroesophageal reflux disease)   . Presence of permanent cardiac pacemaker     Medications:  Scheduled:  . abiraterone Acetate  1,000 mg Oral Daily  . [START ON 01/15/2016] atorvastatin  40 mg Oral QODAY  . cholecalciferol  4,000 Units Oral Daily  . citalopram  20 mg Oral Daily  . docusate sodium  100 mg Oral BID  . folic acid  1 mg Oral Daily  . gabapentin  300 mg Oral BID  . isosorbide mononitrate  30 mg Oral Daily  . metoprolol succinate  50 mg Oral Daily  . multivitamin with minerals  1  tablet Oral Daily  . pantoprazole  40 mg Oral BID AC  . polyvinyl alcohol  1 drop Both Eyes QHS  . predniSONE  2.5 mg Oral Q breakfast  . torsemide  20 mg Oral BID  . vitamin B-12  1,000 mcg Oral Daily  . Warfarin - Pharmacist Dosing Inpatient   Does not apply q1800   Infusions:  . 0.9 % NaCl with KCl 40 mEq / L 75 mL/hr (01/14/16 0358)    Assessment: 82 yr male brought to ED with lethargy and reports of frequent falls at home this week.  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  PTA warfarin 5mg  daily except 2.5mg  MWF - last dose taken at home on 01/12/16, INR on admission 3.37 (supratherapeutic)  Current INR = 3.05 (slightky supratherapeutic), Hgb 10, plt 131  Goal of Therapy:  INR 2-3   Plan:   Hold warfarin for today, possibly restart if INR <3.0 today  Check daily INR   Darl Pikes, PharmD 01/14/2016,8:13 AM

## 2016-01-14 NOTE — Consult Note (Signed)
Urology Consult  Referring physician:   Dr. [name] Reason for referral:   Consideration for bilateral JJ stenting for renal failure  Impression/Assessment:    45 minute family consultation regarding placement of JJ stents. Cr. Has improved today, to 2= level, and his GFR is 27. This would not account for his CNS findings of lethargy, etc. However, review of his CT does show hydronephrosis, and he could have JJ stents placed electively. However, we have discussed the risks of anesthesia with aspiration pneumonia, MI, DVT, and post op UTI, pyelonephritis; and whether we should even place stents in a NCB pt. family insists he was awake and alert and using his walker up until several days ago ( note: 5 falls in last 4 weeks).    He could have bx of his retroperitoneal mass-although it could be fibrosis. No likely prostate. ( undetectable PSA, doung well on Xytega).      Plan:    My sense is that the family is holding on to the fact that he was doing well until his sudden downturn this week. They have been told that placement of stents will "turn around his renal failure, and cure his encephalopathy". They understand that with his Cr turning toward normal today , and with a CrCl of 27.5, that, while he does have bilateral hydro, I am concerned that placement of stents holds little promise of turning his neurologic status around. I would leave this decision up to Dr. Benay Spice and his family, however, and stand ready to pursue any course which is deemed appropriate.    History of Present Illness:   Urology consulted to consider placement of bilateral JJ stents in 82 yo male, DNR, with hx of prostate cancer, on Xgeva; and hx of Non-Hodgkin's L:lymphoma, treated successfully about 10 yrs  with Rituxan, Cytoxan, and Prednisone.    1.      Prostate cancer -  Originally treated by Dr. Tresa Endo, and followed more recently by dr. Benay Spice. He has been treated with hormonal therapy ( Lupron) in the  past,and, the PSA was stable on 10/15/2014.   PSA increased 04/26/2015 . Current PSA 0.1.   Bone scan 05/09/2015 with unchanged increased activity in the thoracic and lumbar spine felt to most likely be degenerative.  Initiation of Abiraterone and prednisone 05/11/2015. Normalization of the PSA on Abiraterone.       2.     History of bilateral hydronephrosis. Renal ultrasound 09/26/2014 consistent with medical renal disease. CT now shows mild bilateral hydronephrosis-progressed slightly since 2016 u/s.      Past Medical History  Diagnosis Date  . Prostate cancer ([place])   . Neuropathy ([place])   . Hypercholesteremia   . Depressive disorder, not elsewhere classified   . Sinoatrial node dysfunction (H[facility])   . Degeneration of lumbar or lumbosacral intervertebral disc   . CAD (coronary artery disease)      NYHA Class 1B, CABG 1985  . HTN (hypertension)   . OSA on CPAP   . Collagen vascular disease ([place])   . PE (pulmonary embolism) 03/2015  . Neuromuscular disorder (HCC)     neuropathy  . GERD (gastroesophageal reflux disease)   . Presence of permanent cardiac pacemaker    Past Surgical History  Procedure Laterality Date  . Coronary artery bypass graft    . Exploratory laparotomy    . Endarterectomy    . Rotator cuff repair    . Lumbar fusion    . Coronary angioplasty with stent placement  s/p bare metal stent implant SVG-diagonal 11/23/05, s/p BM stent implantation SVG diagonal 10/22/06 (feeds LAD), and 05/2010 with DES to left circumflex  . Pacemaker insertion    . Esophagogastroduodenoscopy (egd) with propofol N/A 08/26/2015    Procedure: ESOPHAGOGASTRODUODENOSCOPY (EGD) WITH PROPOFOL;  Surgeon: Clarene Essex, MD;  Location: [facility] ENDOSCOPY;  Service: Endoscopy;  Laterality: N/A;  . Colonoscopy N/A 08/26/2015    Procedure: COLONOSCOPY;  Surgeon: Clarene Essex, MD;  Location: [facility] ENDOSCOPY;  Service: Endoscopy;  Laterality: N/A;  . Cardiac catheterization N/A 08/27/2015    Procedure: Left  Heart Cath and Cors/Grafts Angiography;  Surgeon: Belva Crome, MD;  Location: [facility];  Service: Cardiovascular;  Laterality: N/A;    Medications:  Medication Sig Start Date End Date Taking? Authorizing Provider  abiraterone Acetate (ZYTIGA) 250 MG tablet Take 4 tablets (1,000 mg total) by mouth daily. Take on an empty stomach 1 hour before or 2 hours after a meal 01/06/16  Yes Ladell Pier, MD  atorvastatin (LIPITOR) 40 MG tablet Take 1 tablet (40 mg total) by mouth every other day. Patient taking differently: Take 40 mg by mouth daily.  08/28/15  Yes Brittainy Erie Noe, PA-C  Cholecalciferol (VITAMIN D3) 2000 UNITS TABS Take 2 tablets by mouth daily.    Yes Historical Provider, MD  citalopram (CELEXA) 20 MG tablet Take 20 mg by mouth daily.    Yes Historical Provider, MD  Cyanocobalamin (VITAMIN B-12 CR) 1000 MCG TBCR Take 1 tablet by mouth daily. 04/08/10  Yes Historical Provider, MD  folic acid (FOLVITE) 1 MG tablet Take 1 tablet (1 mg total) by mouth daily. 08/28/15  Yes Brittainy Erie Noe, PA-C  gabapentin (NEURONTIN) 300 MG capsule Take 300 mg by mouth 2 (two) times daily. not to exceed 3 times daily   Yes Historical Provider, MD  isosorbide mononitrate (IMDUR) 30 MG 24 hr tablet Take 1 tablet (30 mg total) by mouth daily. 08/28/15  Yes Brittainy Erie Noe, PA-C  Melatonin 3 MG TABS Take 3 mg by mouth at bedtime.   Yes Historical Provider, MD  metolazone (ZAROXOLYN) 2.5 MG tablet Take 1 tablet (2.5 mg total) by mouth 2 (two) times a week. TAKE 1 TAB ON Monday AND Thursday 01/10/16  Yes Belva Crome, MD  metoprolol succinate (TOPROL-XL) 50 MG 24 hr tablet Take 1 tablet (50 mg total) by mouth daily. Take with or immediately following a meal. 10/21/15  Yes Deboraha Sprang, MD  Multiple Vitamin (MULTIVITAMIN) capsule Take 1 capsule by mouth daily.   Yes Historical Provider, MD  nitroGLYCERIN (NITROLINGUAL)  0.4 MG/SPRAY spray Place 1 spray under the tongue every 5 (five) minutes x 3 doses as needed for chest pain. 08/28/15  Yes Brittainy Erie Noe, PA-C  pantoprazole (PROTONIX) 40 MG tablet Take 1 tablet (40 mg total) by mouth 2 (two) times daily before a meal. 08/28/15  Yes Brittainy Erie Noe, PA-C  Polyethyl Glycol-Propyl Glycol (SYSTANE) 0.4-0.3 % SOLN Place 1 drop into both eyes at bedtime.    Yes Historical Provider, MD  predniSONE (DELTASONE) 5 MG tablet Take 2.5 mg daily 12/26/15  Yes Ladell Pier, MD  torsemide (DEMADEX) 20 MG tablet Take 1 tablet (20 mg total) by mouth 2 (two) times daily. 10/21/15  Yes Deboraha Sprang, MD  UBIQUINOL PO Take 1 tablet by mouth daily. CoQ10   Yes Historical Provider, MD  warfarin (COUMADIN) 5 MG tablet Take 1 tablet (5 mg total) by mouth daily at 6 PM. Except every  MWF take half tablet (2.5 mg) 12/12/15  Yes Ladell Pier, MD         Allergies:  Allergies  Allergen Reactions  . Penicillins Swelling  . Simvastatin Other (See Comments)    Muscle weakness in legs ?    Family History  Problem Relation Age of Onset  . Heart disease Father 43    Social History:  reports that he has never smoked. He has never used smokeless tobacco. He reports that he does not drink alcohol or use illicit drugs.  ROS Pt has fallen 5x in last 4 weeks. No head injury. Had neg head CT in ED. Recent Xaraxolyn per Dr. Tamala Julian for peripheral edema.   Physical Exam:  Vital signs in last 24 hours: Temp:  [98 F (36.7 C)-99 F (37.2 C)] 98.4 F (36.9 C) (06/20 1411) Pulse Rate:  [65-66] 65 (06/20 1411) Resp:  [16] 16 (06/20 0630) BP: (116-135)/(52-56) 116/56 mmHg (06/20 1411) SpO2:  [100 %] 100 % (06/20 1411) Weight:  [103.7 kg (228 lb 9.9 oz)] 103.7 kg (228 lb 9.9 oz) (06/20 0649)   Physical Exam   Constitutional: NAD, calm, comfortable Eyes: PERTLA, lids and conjunctivae normal ENMT: Mucous membranes are dry. Posterior pharynx  clear of any exudate or lesions.  Neck: normal, supple, no masses, no thyromegaly Respiratory: clear to auscultation bilaterally, no wheezing, no crackles. Normal respiratory effort.  Cardiovascular: S1 & S2 heard, regular rate and rhythm. No carotid bruits. No significant JVD. Abdomen: No distension, no tenderness, no masses palpated. Bowel sounds normal.  Musculoskeletal: no clubbing / cyanosis. No joint deformity upper and lower extremities. Normal muscle tone.  Skin: no significant rashes, lesions, ulcers. Warm, dry, well-perfused. Neurologic: CN 2-12 grossly intact. Sensation intact, DTR normal. Asterixis. Dysarthria.  Psychiatric: Normal judgment and insight. Alert and oriented x 3. Normal mood and affect.  Laboratory Data:  Results for orders placed or performed during the hospital encounter of 01/12/16 (from the past 72 hour(s))  CBG monitoring, ED     Status: Abnormal   Collection Time: 01/12/16  7:49 PM  Result Value Ref Range   Glucose-Capillary 122 (H) 65 - 99 mg/dL  CBC with Differential     Status: Abnormal   Collection Time: 01/12/16  7:59 PM  Result Value Ref Range   WBC 7.3 4.0 - 10.5 K/uL   RBC 3.46 (L) 4.22 - 5.81 MIL/uL   Hemoglobin 10.8 (L) 13.0 - 17.0 g/dL   HCT 31.9 (L) 39.0 - 52.0 %   MCV 92.2 78.0 - 100.0 fL   MCH 31.2 26.0 - 34.0 pg   MCHC 33.9 30.0 - 36.0 g/dL   RDW 13.3 11.5 - 15.5 %   Platelets 136 (L) 150 - 400 K/uL   Neutrophils Relative % 71 %   Neutro Abs 5.1 1.7 - 7.7 K/uL   Lymphocytes Relative 19 %   Lymphs Abs 1.4 0.7 - 4.0 K/uL   Monocytes Relative 8 %   Monocytes Absolute 0.6 0.1 - 1.0 K/uL   Eosinophils Relative 2 %   Eosinophils Absolute 0.2 0.0 - 0.7 K/uL   Basophils Relative 0 %   Basophils Absolute 0.0 0.0 - 0.1 K/uL  Comprehensive metabolic panel     Status: Abnormal   Collection Time: 01/12/16  7:59 PM  Result Value Ref Range   Sodium 139 135 - 145 mmol/L   Potassium 2.7 (LL) 3.5 - 5.1 mmol/L    Comment: CRITICAL RESULT  CALLED TO, READ BACK BY AND VERIFIED WITH:  M.QUICK,RN 2042 01/12/16 WSHEA    Chloride 94 (L) 101 - 111 mmol/L   CO2 35 (H) 22 - 32 mmol/L   Glucose, Bld 126 (H) 65 - 99 mg/dL   BUN 55 (H) 6 - 20 mg/dL   Creatinine, Ser 2.34 (H) 0.61 - 1.24 mg/dL   Calcium 10.0 8.9 - 10.3 mg/dL   Total Protein 6.7 6.5 - 8.1 g/dL   Albumin 4.0 3.5 - 5.0 g/dL   AST 22 15 - 41 U/L   ALT 14 (L) 17 - 63 U/L   Alkaline Phosphatase 69 38 - 126 U/L   Total Bilirubin 0.9 0.3 - 1.2 mg/dL   GFR calc non Af Amer 24 (L) >60 mL/min   GFR calc Af Amer 27 (L) >60 mL/min    Comment: (NOTE) The eGFR has been calculated using the CKD EPI equation. This calculation has not been validated in all clinical situations. eGFR's persistently <60 mL/min signify possible Chronic Kidney Disease.    Anion gap 10 5 - 15  Ammonia     Status: None   Collection Time: 01/12/16  7:59 PM  Result Value Ref Range   Ammonia 17 9 - 35 umol/L  Protime-INR     Status: Abnormal   Collection Time: 01/12/16  7:59 PM  Result Value Ref Range   Prothrombin Time 32.4 (H) 11.6 - 15.2 seconds   INR 3.37 (H) 0.00 - 1.49  Magnesium     Status: None   Collection Time: 01/12/16  7:59 PM  Result Value Ref Range   Magnesium 2.0 1.7 - 2.4 mg/dL  Blood gas, arterial     Status: Abnormal   Collection Time: 01/12/16  8:00 PM  Result Value Ref Range   FIO2 0.21    pH, Arterial 7.462 (H) 7.350 - 7.450   pCO2 arterial 49.3 (H) 35.0 - 45.0 mmHg   pO2, Arterial 56.0 (L) 80.0 - 100.0 mmHg   Bicarbonate 34.8 (H) 20.0 - 24.0 mEq/L   TCO2 31.9 0 - 100 mmol/L   Acid-Base Excess 9.9 (H) 0.0 - 2.0 mmol/L   O2 Saturation 88.3 %   Patient temperature 98.4    Collection site RIGHT RADIAL    Drawn by [phone]    Sample type ARTERIAL DRAW    Allens test (pass/fail) PASS PASS  I-stat Chem 8, ED     Status: Abnormal   Collection Time: 01/12/16  8:09 PM  Result Value Ref Range   Sodium 138 135 - 145 mmol/L   Potassium 2.7 (LL) 3.5 - 5.1 mmol/L   Chloride 91 (L)  101 - 111 mmol/L   BUN 46 (H) 6 - 20 mg/dL   Creatinine, Ser 2.30 (H) 0.61 - 1.24 mg/dL   Glucose, Bld 121 (H) 65 - 99 mg/dL   Calcium, Ion 1.21 1.13 - 1.30 mmol/L   TCO2 34 0 - 100 mmol/L   Hemoglobin 11.2 (L) 13.0 - 17.0 g/dL   HCT 33.0 (L) 39.0 - 52.0 %  I-Stat CG4 Lactic Acid, ED     Status: None   Collection Time: 01/12/16  8:10 PM  Result Value Ref Range   Lactic Acid, Venous 1.53 0.5 - 2.0 mmol/L  Urinalysis, Routine w reflex microscopic     Status: None   Collection Time: 01/12/16  8:46 PM  Result Value Ref Range   Color, Urine YELLOW YELLOW   APPearance CLEAR CLEAR   Specific Gravity, Urine 1.010 1.005 - 1.030   pH 5.5 5.0 - 8.0  Glucose, UA NEGATIVE NEGATIVE mg/dL   Hgb urine dipstick NEGATIVE NEGATIVE   Bilirubin Urine NEGATIVE NEGATIVE   Ketones, ur NEGATIVE NEGATIVE mg/dL   Protein, ur NEGATIVE NEGATIVE mg/dL   Nitrite NEGATIVE NEGATIVE   Leukocytes, UA NEGATIVE NEGATIVE    Comment: MICROSCOPIC NOT DONE ON URINES WITH NEGATIVE PROTEIN, BLOOD, LEUKOCYTES, NITRITE, OR GLUCOSE <1000 mg/dL.  Protime-INR     Status: Abnormal   Collection Time: 01/13/16  3:46 AM  Result Value Ref Range   Prothrombin Time 32.6 (H) 11.6 - 15.2 seconds   INR 3.40 (H) 0.00 - 1.49  CBC     Status: Abnormal   Collection Time: 01/13/16  8:24 AM  Result Value Ref Range   WBC 7.1 4.0 - 10.5 K/uL   RBC 3.05 (L) 4.22 - 5.81 MIL/uL   Hemoglobin 9.6 (L) 13.0 - 17.0 g/dL   HCT 28.9 (L) 39.0 - 52.0 %   MCV 94.8 78.0 - 100.0 fL   MCH 31.5 26.0 - 34.0 pg   MCHC 33.2 30.0 - 36.0 g/dL   RDW 13.8 11.5 - 15.5 %   Platelets 126 (L) 150 - 400 K/uL  Basic metabolic panel     Status: Abnormal   Collection Time: 01/13/16  8:24 AM  Result Value Ref Range   Sodium 140 135 - 145 mmol/L   Potassium 2.8 (L) 3.5 - 5.1 mmol/L   Chloride 96 (L) 101 - 111 mmol/L   CO2 34 (H) 22 - 32 mmol/L   Glucose, Bld 110 (H) 65 - 99 mg/dL   BUN 53 (H) 6 - 20 mg/dL   Creatinine, Ser 2.34 (H) 0.61 - 1.24 mg/dL   Calcium  9.4 8.9 - 10.3 mg/dL   GFR calc non Af Amer 24 (L) >60 mL/min   GFR calc Af Amer 27 (L) >60 mL/min    Comment: (NOTE) The eGFR has been calculated using the CKD EPI equation. This calculation has not been validated in all clinical situations. eGFR's persistently <60 mL/min signify possible Chronic Kidney Disease.    Anion gap 10 5 - 15  Protime-INR     Status: Abnormal   Collection Time: 01/14/16  4:05 AM  Result Value Ref Range   Prothrombin Time 30.1 (H) 11.6 - 15.2 seconds   INR 3.05 (H) 0.00 - 1.49  CBC     Status: Abnormal   Collection Time: 01/14/16  4:05 AM  Result Value Ref Range   WBC 8.0 4.0 - 10.5 K/uL   RBC 3.20 (L) 4.22 - 5.81 MIL/uL   Hemoglobin 10.0 (L) 13.0 - 17.0 g/dL   HCT 30.4 (L) 39.0 - 52.0 %   MCV 95.0 78.0 - 100.0 fL   MCH 31.3 26.0 - 34.0 pg   MCHC 32.9 30.0 - 36.0 g/dL   RDW 13.6 11.5 - 15.5 %   Platelets 131 (L) 150 - 400 K/uL  Basic metabolic panel     Status: Abnormal   Collection Time: 01/14/16  4:05 AM  Result Value Ref Range   Sodium 140 135 - 145 mmol/L   Potassium 3.6 3.5 - 5.1 mmol/L    Comment: RESULT REPEATED AND VERIFIED DELTA CHECK NOTED NO VISIBLE HEMOLYSIS    Chloride 100 (L) 101 - 111 mmol/L   CO2 32 22 - 32 mmol/L   Glucose, Bld 112 (H) 65 - 99 mg/dL   BUN 53 (H) 6 - 20 mg/dL   Creatinine, Ser 2.07 (H) 0.61 - 1.24 mg/dL   Calcium 9.2 8.9 -  10.3 mg/dL   GFR calc non Af Amer 27 (L) >60 mL/min   GFR calc Af Amer 32 (L) >60 mL/min    Comment: (NOTE) The eGFR has been calculated using the CKD EPI equation. This calculation has not been validated in all clinical situations. eGFR's persistently <60 mL/min signify possible Chronic Kidney Disease.    Anion gap 8 5 - 15  Glucose, capillary     Status: Abnormal   Collection Time: 01/14/16  8:08 AM  Result Value Ref Range   Glucose-Capillary 112 (H) 65 - 99 mg/dL   Comment 1 Notify RN    Comment 2 Document in Chart    No results found for this or any previous visit (from the past  240 hour(s)). Creatinine:  Recent Labs  01/09/16 1306 01/12/16 1959 01/12/16 2009 01/13/16 0824 01/14/16 0405  CREATININE 2.23* 2.34* 2.30* 2.34* 2.07*   Baseline Creatinine:  Multidetector CT imaging of the abdomen and pelvis was performed following the standard protocol without IV contrast.  COMPARISON: Abdominal CT dated 05/01/2015  FINDINGS: Evaluation of this exam is limited in the absence of intravenous contrast.  Linear atelectasis/scarring at the lung bases. There is coronary vascular calcification. Cardiac pacemaker wire noted. No intra-abdominal free air or free fluid.  There multiple stones within the gallbladder. There is a stone at the neck of the gallbladder. There is no pericholecystic fluid. Ultrasound may provide better evaluation of the gallbladder. The liver, pancreas, spleen, and the adrenal glands appear unremarkable. There is moderate bilateral renal atrophy. There is mild current bilateral hydronephrosis, progressed compared to the prior study. Multiple bilateral renal exophytic hypodense lesions are not characterized but may represent cysts. These lesions measure up to 4.2 cm in the inferior pole of the right kidney. There is apparent diffuse thickening of the bladder wall which may be partly related to underdistention. Cystitis is not excluded. Correlation with urinalysis recommended. The prostate and seminal vesicles are grossly unremarkable.  Constipation. There is no evidence of bowel obstruction or active inflammation. The appendix is not visualized with certainty.  There is advanced aortoiliac atherosclerotic disease. Evaluation of the vasculature is limited in the absence of intravenous contrast. There is retroperitoneal soft tissue density encasing in the abdominal aorta and the IVC. Findings may represent retroperitoneal fibrosis, or neoplastic processes such as metastatic disease, or lymphoma/ lymphoproliferative disease. There  has been interval increase in the size of the retroperitoneal/periaortic soft tissue compared to the prior study. There is encasement of the common iliac arteries as well. The retroperitoneal soft tissue measures approximately 8.4 x 4.5 cm (previously 7.7 x 3.7 cm) at the level of the renal arteries.  There are top-normal mesenteric lymph nodes. There is an area of stranding in the right lower quadrant which is similar or slightly increased from prior study. Midline vertical anterior abdominal wall incisional scar. There is degenerative changes of the spine. Lower lumbar laminectomy and posterior fusion hardware. No acute fracture.  IMPRESSION: Progression of the retroperitoneal/ periaortic soft tissue with encasement of the aorta. 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  Moderate bilateral hydronephrosis, increased from prior study, and likely related to adhesion/compression of the proximal ureters by the retroperitoneal mass.  Cholelithiasis.  Constipation. No evidence of bowel obstruction or active inflammation.   Electronically Signed  By: Anner Crete M.D.  On: 01/12/2016 23:10   Riyan Gavina I Alanys Godino 01/14/2016, 8:32 PM

## 2016-01-15 DIAGNOSIS — R627 Adult failure to thrive: Secondary | ICD-10-CM

## 2016-01-15 DIAGNOSIS — I25810 Atherosclerosis of coronary artery bypass graft(s) without angina pectoris: Secondary | ICD-10-CM

## 2016-01-15 DIAGNOSIS — Z515 Encounter for palliative care: Secondary | ICD-10-CM | POA: Insufficient documentation

## 2016-01-15 DIAGNOSIS — R531 Weakness: Secondary | ICD-10-CM | POA: Insufficient documentation

## 2016-01-15 DIAGNOSIS — G4733 Obstructive sleep apnea (adult) (pediatric): Secondary | ICD-10-CM

## 2016-01-15 LAB — CBC
HCT: 30.6 % — ABNORMAL LOW (ref 39.0–52.0)
Hemoglobin: 10 g/dL — ABNORMAL LOW (ref 13.0–17.0)
MCH: 31.4 pg (ref 26.0–34.0)
MCHC: 32.7 g/dL (ref 30.0–36.0)
MCV: 96.2 fL (ref 78.0–100.0)
PLATELETS: 133 10*3/uL — AB (ref 150–400)
RBC: 3.18 MIL/uL — ABNORMAL LOW (ref 4.22–5.81)
RDW: 13.6 % (ref 11.5–15.5)
WBC: 7.6 10*3/uL (ref 4.0–10.5)

## 2016-01-15 LAB — BASIC METABOLIC PANEL
ANION GAP: 8 (ref 5–15)
BUN: 45 mg/dL — AB (ref 6–20)
CHLORIDE: 104 mmol/L (ref 101–111)
CO2: 30 mmol/L (ref 22–32)
CREATININE: 1.91 mg/dL — AB (ref 0.61–1.24)
Calcium: 9 mg/dL (ref 8.9–10.3)
GFR calc non Af Amer: 30 mL/min — ABNORMAL LOW (ref >60)
GFR, EST AFRICAN AMERICAN: 35 mL/min — AB (ref >60)
GLUCOSE: 115 mg/dL — AB (ref 65–99)
Potassium: 3.5 mmol/L (ref 3.5–5.1)
Sodium: 142 mmol/L (ref 135–145)

## 2016-01-15 LAB — PROTIME-INR
INR: 2.47 — AB (ref 0.00–1.49)
PROTHROMBIN TIME: 26.4 s — AB (ref 11.6–15.2)

## 2016-01-15 LAB — C DIFFICILE QUICK SCREEN W PCR REFLEX
C DIFFICILE (CDIFF) INTERP: NEGATIVE
C DIFFICILE (CDIFF) TOXIN: NEGATIVE
C Diff antigen: NEGATIVE

## 2016-01-15 MED ORDER — POTASSIUM CHLORIDE CRYS ER 20 MEQ PO TBCR
40.0000 meq | EXTENDED_RELEASE_TABLET | Freq: Once | ORAL | Status: AC
  Administered 2016-01-15: 40 meq via ORAL
  Filled 2016-01-15: qty 2

## 2016-01-15 MED ORDER — WARFARIN SODIUM 2.5 MG PO TABS
2.5000 mg | ORAL_TABLET | Freq: Once | ORAL | Status: AC
  Administered 2016-01-15: 2.5 mg via ORAL
  Filled 2016-01-15: qty 1

## 2016-01-15 NOTE — Progress Notes (Signed)
IP PROGRESS NOTE  Subjective:   Edward Woodward is alert today. No specific complaint.  Objective: Vital signs in last 24 hours: Blood pressure 130/58, pulse 67, temperature 99.2 F (37.3 C), temperature source Oral, resp. rate 16, height 5\' 10"  (1.778 m), weight 235 lb 0.2 oz (106.6 kg), SpO2 96 %.  Intake/Output from previous day: 06/20 0701 - 06/21 0700 In: 2578.8 [P.O.:720; I.V.:1858.8] Out: 1175 [Urine:1175]  Physical Exam:   Lungs: Clear anteriorly, no respiratory distress Cardiac: Regular rate and rhythm Extremities: Trace edema at the left greater than right lower leg-improved Neurologic: Alert, oriented to year. Follows commands.    Lab Results:  Recent Labs  01/14/16 0405 01/15/16 0342  WBC 8.0 7.6  HGB 10.0* 10.0*  HCT 30.4* 30.6*  PLT 131* 133*    BMET  Recent Labs  01/14/16 0405 01/15/16 0342  NA 140 142  K 3.6 3.5  CL 100* 104  CO2 32 30  GLUCOSE 112* 115*  BUN 53* 45*  CREATININE 2.07* 1.91*  CALCIUM 9.2 9.0    Studies/Results: No results found.  Medications: I have reviewed the patient's current medications.  Assessment/Plan:  1. Non-Hodgkin's lymphoma (follicular grade 3 lymphoma) - status post 6 cycles of Cytoxan/prednisone/rituximab 07/01/2007 through 10/21/2007. He remains in clinical remission. 2. CT chest 04/01/2015 with no lymphadenopathy  CT abdomen/pelvis 05/01/2015 with mild progression of abdomen/pelvic lymphadenopathy 3. Prostate cancer - he has been treated with hormonal therapy, the PSA was stable on 10/15/2014  PSA increased 04/26/2015   Bone scan 05/09/2015 with unchanged increased activity in the thoracic and lumbar spine felt to most likely be degenerative.   Initiation of Abiraterone and prednisone 05/11/2015. Normalization of the PSA on Abiraterone. 4. History of a normocytic anemia secondary to non-Hodgkin's lymphoma, chemotherapy, and renal insufficiency - progressive secondary to GI bleeding,  stable 5. History of Mild thrombocytopenia  6. History of bilateral hydronephrosis. Renal ultrasound 09/26/2014 consistent with medical renal disease. No hydronephrosis. 7. Coronary artery disease - status post coronary artery stent placement. 8. History of noncalcified lung nodules. 9. History of severe neutropenia July 2009 - likely related to delayed toxicity from rituximab. 10. Macular degeneration. 11. Right middle lobe density on a CT of the chest 06/18/2010 measuring 2.5 x 1.6 cm with mildly increased metabolic activity (SUV max 2.5) on a PET scan 06/27/2010. The area of increased density was less confluent and appeared smaller on a restaging CT 09/08/2010. Right middle lobe "scarring" with no new or suspicious pulmonary nodule on a CT 08/04/2011. 12. Mild increased metabolic activity associated with several lymph nodes on the PET scan 06/27/2010 - likely related to lymphoma. No lymphadenopathy in the mediastinum or axillary areas on the CT 08/04/2011. 13. Right hip discomfort-he received a steroid injection by his primary physician without improvement. He saw Dr. Collier Salina and there was a question of a lytic process in the right pelvis. A bone scan on 09/21/2012 revealed no evidence of metastatic disease. 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 Renal insufficiency-progressive CT 01/12/2016 confirmed progressive hydronephrosis, improved 15. Left leg DVT and pulmonary embolism 04/01/2015-on Coumadin 16. Admission 08/23/2015 with severe anemia and GI bleeding  Upper and lower endoscopy 08/26/2015 without a clear source for bleeding identified 17. Leg edema/anasarca-improved with leg wrapping and diuretics 18. Altered mental status 19. CT 01/12/2016 with progressive soft tissue in the retroperitoneum surrounding the aorta-probable retroperitoneal fibrosis versus lymphoma  Edward Woodward appears improved from a mental status standpoint. He has multiple medical  conditions  contributing to his failure to thrive. I discussed the case with Dr. Gaynelle Arabian. I agree that Edward Woodward is unlikely to benefit from placement of ureter stents. I do not recommend a biopsy of the retroperitoneal soft tissue.  The altered mental status on hospital admission may have been related to polypharmacy, a CNS event, or renal failure. He appears improved. It is difficult for him to ambulate. I discussed the possible need for placement with his family.  I agree with the palliative care consult.  Recommendations: 1. Physical therapy evaluation to determine need for placement 2. Continue abiraterone 3. We can consider a PET scan and biopsy of the retroperitoneal soft tissue as an outpatient if his clinical status improves 4. Consider nursing facility placement pending the physical therapy evaluation.     LOS: 2 days   [name], MD   01/15/2016, 2:08 PM

## 2016-01-15 NOTE — Progress Notes (Addendum)
PROGRESS NOTE    Edward Woodward  [id number] DOB: 1927-03-03 DOA: 01/12/2016 PCP: Mathews Argyle, MD   Brief Narrative:  HPI on 01/13/2016 by Dr. Christia Reading Opyd Edward Woodward is a 81 y.o. male with medical history significant for prostate cancer, non-Hodgkin's lymphoma on chemotherapy, depression, CAD status post CABG, atrial fibrillation on Coumadin, and sinus node dysfunction with pacer who presents to the ED with worsening lethargy, confusion, and falls. Patient has reportedly been in declining health for quite some time with decreasing appetite and increasing weakness. This has been significantly amplified in recent days. Several days ago, he was ambulating by himself, feeding himself, and cogent. Now, he is brought in by his wife and daughter with lethargy, inability to ambulate even with assistance, severe upper extremity tremor, confusion, and falls. Patient is on Coumadin, but denies striking his head during any of these falls or losing consciousness. He has some superficial abrasions on the lower extremities, but denies any other significant injury or pain. Patient denies any recent fevers, chills, chest pain, palpitations, dyspnea, or cough. He denies abdominal pain, nausea, vomiting, or diarrhea.   Assessment & Plan   Acute encephalopathy  -Uncertain etiology at this time; suspected toxic-metabolic in setting of electrolyte derangements and AKI  -Per wife, mental status seems to be back at baseline -Head CT negative for acute intracranial abnormality -Ammonia normal -UA and CXR unremarkable for infection -Patient currently afebrile with no leukocytosis  AKI superimposed on CKD stage III  -Cr 2.34 on admission, up from 1.7 in May and 1.2 in February 2017  -Suspect a post-renal etiology secondary to extrinsic compression of bilateral ureters by retroperitoneal mass  -Creatinine improving, 1.91 today -Avoiding nephrotoxins, holding diuretics  -Monitor  intake/output  Moderate hydronephrosis -Noted on CT abd/pelvis, increased from prior study. Likely related to adhesion/compression of proximal ureters by retroperitoneal mass -Urology consulted and appreciated. Patient was seeing Dr. Rosana Hoes in the past.  -Spoke with Dr. Gaynelle Arabian, unlikely that placing stents will change patient's QOL.  Family has opted to not pursue procedures at this time. -Palliative care consulted.  Atrial fibrillation with supratherapeutic INR -CHADS-VASc at least 4 (age x2, HTN, CHF) -On AC with coumadin, INR 3.37 on admission -INR 2.47 today -Continue coumadin per pharmacy -Continue metoprolol   Non-Hodgkin lymphoma, prostate cancer  -Followed by oncology with ongoing chemo for NHL; hormone therapy for prostate CA with recent increase in PSA per notes  -Retroperitoneal mass with interval enlargement noted on CT abd/pel  -Ongoing management per oncology team- Dr. Benay Spice made aware of patient's admission  OSA  -Continue CPAP QHS  Hypertension  -Continue Toprol, hold diuretics for now in setting of AKI   Normocytic anemia -Hemoglobin 10 and stable  -No sign of active losses  -Continue to monitor CBC  Hypokalemia  -resolved, potassium 3.5 today -Serum potassium 2.7 on admission  -Suspected secondary to poor PO intake and loop diuretics -Continue to replace and montior BMP -Magnesium 2  Chronic diastolic CHF  -TTE (123XX123) with EF 60-65%, moderate concentric hypertrophy, grade 1 diastolic dysfunction, mild LAE  -Managed with torsemide, metolazone, and metoprolol at home -Holding diuretics for now while gently hydrating  -Follow strict I/O's, daily wts; fluid-restrict diet, resume diuretics as appropriate   Deconditioning and frequent falls -Spoke with wife regarding coumadin therapy and the risk of bleeding especially with falls.  -PT consulted and recommended SNF  Diarrhea  -Patient was given colace, and developed diarrhea. Cdiff  was negative.  DVT Prophylaxis  Coumadin  Code  Status: DNR  Family Communication: Wife at bedside  Disposition Plan: Admitted. Pending improvement, SNF, palliative care consult.  in   Consultants Oncology Urology Palliative care  Procedures  None  Antibiotics   Anti-infectives    None      Subjective:   Edward Woodward seen and examined today.  Patient has no complaints. Denies chest pain, shortness of breath, abdominal pain, dizziness, headache.   Objective:   Filed Vitals:   01/14/16 1033 01/14/16 1411 01/14/16 2102 01/15/16 0550  BP: 135/56 116/56 134/58 127/59  Pulse: 66 65 67 75  Temp:  98.4 F (36.9 C) 99 F (37.2 C) 99.2 F (37.3 C)  TempSrc:  Oral Oral Oral  Resp:      Height:      Weight:    106.6 kg (235 lb 0.2 oz)  SpO2:  100% 100% 96%    Intake/Output Summary (Last 24 hours) at 01/15/16 1034 Last data filed at 01/15/16 0657  Gross per 24 hour  Intake 2338.75 ml  Output   1175 ml  Net 1163.75 ml   Filed Weights   01/14/16 0649 01/15/16 0550  Weight: 103.7 kg (228 lb 9.9 oz) 106.6 kg (235 lb 0.2 oz)    Exam  General: Well developed, well nourished, NAD  HEENT: NCAT,mucous membranes moist.   Cardiovascular: S1 S2 auscultated,  2/6 SEM, RRR  Respiratory: Clear to auscultation bilaterally  Abdomen: Soft, nontender, nondistended, + bowel sounds  Extremities: warm dry without cyanosis clubbing. LE edema. Abrasions on LE  Neuro: AAOx 3 (place, person, time), nonfocal  Psych: Appropriate mood and affect   Data Reviewed: I have personally reviewed following labs and imaging studies  CBC:  Recent Labs Lab 01/12/16 1959 01/12/16 2009 01/13/16 0824 01/14/16 0405 01/15/16 0342  WBC 7.3  --  7.1 8.0 7.6  NEUTROABS 5.1  --   --   --   --   HGB 10.8* 11.2* 9.6* 10.0* 10.0*  HCT 31.9* 33.0* 28.9* 30.4* 30.6*  MCV 92.2  --  94.8 95.0 96.2  PLT 136*  --  126* 131* [id number]*   Basic Metabolic Panel:  Recent Labs Lab 01/09/16 1306  01/12/16 1959 01/12/16 2009 01/13/16 0824 01/14/16 0405 01/15/16 0342  NA 140 139 138 140 140 142  K 3.6 2.7* 2.7* 2.8* 3.6 3.5  CL 96* 94* 91* 96* 100* 104  CO2 32* 35*  --  34* 32 30  GLUCOSE 127* 126* 121* 110* 112* 115*  BUN 48* 55* 46* 53* 53* 45*  CREATININE 2.23* 2.34* 2.30* 2.34* 2.07* 1.91*  CALCIUM 10.0 10.0  --  9.4 9.2 9.0  MG  --  2.0  --   --   --   --    GFR: Estimated Creatinine Clearance: 33.9 mL/min (by C-G formula based on Cr of 1.91). Liver Function Tests:  Recent Labs Lab 01/12/16 1959  AST 22  ALT 14*  ALKPHOS 69  BILITOT 0.9  PROT 6.7  ALBUMIN 4.0   No results for input(s): LIPASE, AMYLASE in the last 168 hours.  Recent Labs Lab 01/12/16 1959  AMMONIA 17   Coagulation Profile:  Recent Labs Lab 01/12/16 1959 01/13/16 0346 01/14/16 0405 01/15/16 0342  INR 3.37* 3.40* 3.05* 2.47*   Cardiac Enzymes: No results for input(s): CKTOTAL, CKMB, CKMBINDEX, TROPONINI in the last 168 hours. BNP (last 3 results) No results for input(s): PROBNP in the last 8760 hours. HbA1C: No results for input(s): HGBA1C in the last 72 hours. CBG:  Recent Labs  Lab 01/12/16 1949 01/14/16 0808  GLUCAP 122* 112*   Lipid Profile: No results for input(s): CHOL, HDL, LDLCALC, TRIG, CHOLHDL, LDLDIRECT in the last 72 hours. Thyroid Function Tests: No results for input(s): TSH, T4TOTAL, FREET4, T3FREE, THYROIDAB in the last 72 hours. Anemia Panel: No results for input(s): VITAMINB12, FOLATE, FERRITIN, TIBC, IRON, RETICCTPCT in the last 72 hours. Urine analysis:    Component Value Date/Time   COLORURINE YELLOW 01/12/2016 2046   APPEARANCEUR CLEAR 01/12/2016 2046   LABSPEC 1.010 01/12/2016 2046   PHURINE 5.5 01/12/2016 2046   GLUCOSEU NEGATIVE 01/12/2016 2046   HGBUR NEGATIVE 01/12/2016 2046   BILIRUBINUR NEGATIVE 01/12/2016 2046   KETONESUR NEGATIVE 01/12/2016 2046   PROTEINUR NEGATIVE 01/12/2016 2046   NITRITE NEGATIVE 01/12/2016 2046   LEUKOCYTESUR  NEGATIVE 01/12/2016 2046   Sepsis Labs: @LABRCNTIP (procalcitonin:4,lacticidven:4)  ) Recent Results (from the past 240 hour(s))  C difficile quick scan w PCR reflex     Status: None   Collection Time: 01/15/16  1:14 AM  Result Value Ref Range Status   C Diff antigen NEGATIVE NEGATIVE Final   C Diff toxin NEGATIVE NEGATIVE Final   C Diff interpretation Negative for toxigenic C. difficile  Final      Radiology Studies: No results found.   Scheduled Meds: . abiraterone Acetate  1,000 mg Oral Daily  . atorvastatin  40 mg Oral QODAY  . cholecalciferol  4,000 Units Oral Daily  . citalopram  20 mg Oral Daily  . docusate sodium  100 mg Oral BID  . folic acid  1 mg Oral Daily  . gabapentin  300 mg Oral BID  . isosorbide mononitrate  30 mg Oral Daily  . metoprolol succinate  50 mg Oral Daily  . multivitamin with minerals  1 tablet Oral Daily  . pantoprazole  40 mg Oral BID AC  . polyvinyl alcohol  1 drop Both Eyes QHS  . predniSONE  2.5 mg Oral Q breakfast  . torsemide  20 mg Oral BID  . vitamin B-12  1,000 mcg Oral Daily  . warfarin  2.5 mg Oral ONCE-1800  . Warfarin - Pharmacist Dosing Inpatient   Does not apply q1800   Continuous Infusions: . 0.9 % NaCl with KCl 40 mEq / L 75 mL/hr (01/15/16 0654)     LOS: 2 days   Time Spent in minutes   30 minutes  Erdem Naas D.O. on 01/15/2016 at 10:34 AM  Between 7am to 7pm - Pager - [phone]  After 7pm go to [email or web address] - password [email or web address]  And look for the night coverage person covering for me after hours  [facility] Office  [phone]

## 2016-01-15 NOTE — Progress Notes (Signed)
Physical Therapy Treatment Patient Details Name: Edward Woodward MRN: [id number] DOB: 1927/04/19 Today's Date: 01/15/2016    History of Present Illness 82 yo male admitted with acute encephalopathy, falls. Hx of prostate cancer, NHL, CAD, CABG, A fib, PE, pacemaker, neuropathy, HTN, NM d/o    PT Comments    Continue to recommend SNF. Pt showing some improvement compared to last session however still has some cognitive deficits and remains at high risk for falls. Decreased safety awareness and insight into current condition-thinks he can do more than he actually can. Bil LE instability/jerking with attempt at ambulation.   Follow Up Recommendations  SNF     Equipment Recommendations       Recommendations for Other Services       Precautions / Restrictions Precautions Precautions: Fall Precaution Comments: high fall risk Restrictions Weight Bearing Restrictions: No    Mobility  Bed Mobility Overal bed mobility: Needs Assistance Bed Mobility: Supine to Sit     Supine to sit: Mod assist;+2 for physical assistance;+2 for safety/equipment;HOB elevated     General bed mobility comments: Assist for bil LEs and trunk. Multimodal cueing for required. Increased time. Utilized bedpad for scooting, positioning. Posterior lean.  Transfers Overall transfer level: Needs assistance Equipment used: Rolling walker (2 wheeled) Transfers: Sit to/from [facility] Sit to Stand: Mod assist;+2 physical assistance;+2 safety/equipment;From elevated surface Stand pivot transfers: Mod assist;+2 physical assistance;+2 safety/equipment;From elevated surface       General transfer comment: Assist to rise, stabilize, control descent. Stand pivot to bsc with RW. VCs safety.   Ambulation/Gait Ambulation/Gait assistance: Mod assist;+2 physical assistance;+2 safety/equipment Ambulation Distance (Feet): 3 Feet Assistive device: Rolling walker (2 wheeled) Gait Pattern/deviations:  Step-through pattern;Decreased stride length     General Gait Details: Attempted ambulation from [facility] to recliner (~5 feet)-pt only able to take about 3 steps before bil LE instability/jerking began. Recliner had to be brought closer and pt assisted into recliner to prevent fall. VCs safety.    Stairs            Wheelchair Mobility    Modified Rankin (Stroke Patients Only)       Balance Overall balance assessment: Needs assistance         Standing balance support: Bilateral upper extremity supported;During functional activity Standing balance-Leahy Scale: Poor                      Cognition Arousal/Alertness: Awake/alert Behavior During Therapy: WFL for tasks assessed/performed Overall Cognitive Status: Impaired/Different from baseline Area of Impairment: Orientation;Problem solving Orientation Level: Place           Problem Solving: Requires verbal cues;Requires tactile cues      Exercises      General Comments        Pertinent Vitals/Pain Pain Assessment: No/denies pain    Home Living                      Prior Function            PT Goals (current goals can now be found in the care plan section) Progress towards PT goals: Progressing toward goals (slowly)    Frequency  Min 3X/week    PT Plan Current plan remains appropriate    Co-evaluation             End of Session Equipment Utilized During Treatment: Gait belt Activity Tolerance: Patient tolerated treatment well Patient left: in chair;with call bell/phone within reach;with  chair alarm set;with family/visitor present     Time: [id number] PT Time Calculation (min) (ACUTE ONLY): 23 min  Charges:  $Therapeutic Activity: 8-22 mins                    G Codes:      Weston Anna, MPT Pager: [phone]

## 2016-01-15 NOTE — Progress Notes (Signed)
                                                                                 Consultation Note Date: 01/15/2016   Patient Name: Edward Woodward  DOB: 08/14/1926  MRN: [id number]  Age / Sex: 82 y.o., male  PCP: Hal Stoneking, MD Referring Physician: Maryann Mikhail, DO  Reason for Consultation: Establishing goals of care  HPI/Patient Profile: 82 y.o. male  with past medical history of prostate cancer, non-Hodgkin's lymphoma on chemotherapy, depression, CAD status post CABG, atrial fibrillation on Coumadin, and sinus node dysfunction with pacer admitted on 01/12/2016 with lethargy, inability to ambulate even with assistance, severe upper extremity tremor, confusion, and falls. He has had gradual decline in functional status over past few months and last month specifically. However he had a significant change over the few days PTA. Found to have encephalopathy, acute on chronic renal failure, increase in size of retroperitoneal mass with mod hydronephrosis. Urology and oncology consulted.   Clinical Assessment and Goals of Care: I met today with Mr. Sabel along with his wife, Janice, and daughter, Irene, at bedside. They have been caring (and desire to continue) for him at home but this has become increasingly more difficult for them in his weakened state. At this point they have decided they do not want stents or biopsy/work up of retroperitoneal mass. Mr. Hutcherson does have multiple comorbitities including CKD, diastolic heart failure, and CAD and diuretics were increased the week prior to admission and we wonder if this triggered this event and admission. The good news is that creatinine is trending down even without stents.   All at bedside are able to acknowledge the decline. Ms. Degrasse tells us that he felt like he was going to die earlier in the week. He said this was a very scary experience. We did explore his desires and wishes at EOL. He tells us that he is not afraid to die. He desires  to remain at home as long as possible and desires to die at home. He does not want to suffer. As a family they wish to pursue SNF rehab with the hopes he will gain some strength to make home more manageable. We explored options that may be available in the future such as outpatient palliative care as well as hospice and when these are appropriate. They are interested and I do recommend outpatient palliative care to be initiated in SNF and then followed as outpatient at home.   Janie expresses fear that he will be judged by the medical system for his age and felt that this happened to her sister. I asked if she felt this was being done here and she says no but it is a fear that this may happen in the future. I explain that as we age it is more difficult for the body to recover and bounce back but I find the overall trend of declining functional status (as he has had) more important than even age for overall prognosis. I am not convinced he is hospice appropriate at this point but this could quickly change. We discussed coming back to the hospital and they would consider this if he   is doing somewhat well with QOL and they believe he has a reversible condition (hopeful outpatient palliative services can help them with this decision in future). We also reviewed MOST form but not completed today. All questions/concerns addressed.    HCPOA wife Janice    SUMMARY OF RECOMMENDATIONS   - DNR - SNF rehab with palliative follow up - Considering options and decisions/reviewing MOST form - He does want to be at home and to die at home - No pursuit of work up for enlarged retroperitoneal mass - Wish to discuss continuing Coumadin with Dr. Sherrill given fall risk vs clot risk  Code Status/Advance Care Planning:  DNR - need [facility] for home   Symptom Management:   Chronic BLE neuropathy: Continue gabapentin. Tylenol prn.   Palliative Prophylaxis:   Delirium Protocol and Frequent Pain  Assessment   Psycho-social/Spiritual:   Desire for further Chaplaincy support:no - he is Catholic but does not wish for visit or communion  Additional Recommendations: Caregiving  Support/Resources  Prognosis:   Unable to determine - depending on outcomes. Could potentially be < 6 months but will need to follow for changes in functional status over next few weeks in rehab for recommendations for palliative vs hospice at home following rehab.   Discharge Planning: Skilled Nursing Facility for rehab with Palliative care service follow-up      Primary Diagnoses: Present on Admission:  . Atherosclerosis of coronary artery bypass graft of native heart without angina pectoris . OSA on CPAP . Essential hypertension, benign . Atrial fibrillation (HCC) . CKD (chronic kidney disease), stage III . Hypokalemia . Prostate cancer (HCC) . Falls . Failure to thrive in adult . Retroperitoneal mass . Hydronephrosis . AKI (acute kidney injury) (HCC) . Supratherapeutic INR . Encephalopathy acute  I have reviewed the medical record, interviewed the patient and family, and examined the patient. The following aspects are pertinent.  Past Medical History  Diagnosis Date  . Prostate cancer (HCC)   . Neuropathy (HCC)   . Hypercholesteremia   . Depressive disorder, not elsewhere classified   . Sinoatrial node dysfunction (HCC)   . Degeneration of lumbar or lumbosacral intervertebral disc   . CAD (coronary artery disease)      NYHA Class 1B, CABG 1985  . HTN (hypertension)   . OSA on CPAP   . Collagen vascular disease (HCC)   . PE (pulmonary embolism) 03/2015  . Neuromuscular disorder (HCC)     neuropathy  . GERD (gastroesophageal reflux disease)   . Presence of permanent cardiac pacemaker    Social History   Social History  . Marital Status: Married    Spouse Name: N/A  . Number of Children: 3  . Years of Education: N/A   Occupational History  . Retired accountant    Social  History Main Topics  . Smoking status: Never Smoker   . Smokeless tobacco: Never Used  . Alcohol Use: No  . Drug Use: No  . Sexual Activity: Not Asked   Other Topics Concern  . None   Social History Narrative   Family History  Problem Relation Age of Onset  . Heart disease Father 55   Scheduled Meds: . abiraterone Acetate  1,000 mg Oral Daily  . atorvastatin  40 mg Oral QODAY  . cholecalciferol  4,000 Units Oral Daily  . citalopram  20 mg Oral Daily  . docusate sodium  100 mg Oral BID  . folic acid  1 mg Oral Daily  . gabapentin    300 mg Oral BID  . isosorbide mononitrate  30 mg Oral Daily  . metoprolol succinate  50 mg Oral Daily  . multivitamin with minerals  1 tablet Oral Daily  . pantoprazole  40 mg Oral BID AC  . polyvinyl alcohol  1 drop Both Eyes QHS  . predniSONE  2.5 mg Oral Q breakfast  . torsemide  20 mg Oral BID  . vitamin B-12  1,000 mcg Oral Daily  . warfarin  2.5 mg Oral ONCE-1800  . Warfarin - Pharmacist Dosing Inpatient   Does not apply q1800   Continuous Infusions:  PRN Meds:.acetaminophen **OR** acetaminophen, benzocaine, bisacodyl, HYDROcodone-acetaminophen, nitroGLYCERIN, ondansetron **OR** ondansetron (ZOFRAN) IV, phenol, polyethylene glycol Medications Prior to Admission:  Prior to Admission medications   Medication Sig Start Date End Date Taking? Authorizing Provider  abiraterone Acetate (ZYTIGA) 250 MG tablet Take 4 tablets (1,000 mg total) by mouth daily. Take on an empty stomach 1 hour before or 2 hours after a meal 01/06/16  Yes Ladell Pier, MD  atorvastatin (LIPITOR) 40 MG tablet Take 1 tablet (40 mg total) by mouth every other day. Patient taking differently: Take 40 mg by mouth daily.  08/28/15  Yes Brittainy Erie Noe, PA-C  Cholecalciferol (VITAMIN D3) 2000 UNITS TABS Take 2 tablets by mouth daily.    Yes Historical Provider, MD  citalopram (CELEXA) 20 MG tablet Take 20 mg by mouth daily.     Yes Historical Provider, MD  Cyanocobalamin  (VITAMIN B-12 CR) 1000 MCG TBCR Take 1 tablet by mouth daily. 04/08/10  Yes Historical Provider, MD  folic acid (FOLVITE) 1 MG tablet Take 1 tablet (1 mg total) by mouth daily. 08/28/15  Yes Brittainy Erie Noe, PA-C  gabapentin (NEURONTIN) 300 MG capsule Take 300 mg by mouth 2 (two) times daily. not to exceed 3 times daily   Yes Historical Provider, MD  isosorbide mononitrate (IMDUR) 30 MG 24 hr tablet Take 1 tablet (30 mg total) by mouth daily. 08/28/15  Yes Brittainy Erie Noe, PA-C  Melatonin 3 MG TABS Take 3 mg by mouth at bedtime.   Yes Historical Provider, MD  metolazone (ZAROXOLYN) 2.5 MG tablet Take 1 tablet (2.5 mg total) by mouth 2 (two) times a week. TAKE  1 TAB ON   Monday AND  Thursday 01/10/16  Yes Belva Crome, MD  metoprolol succinate (TOPROL-XL) 50 MG 24 hr tablet Take 1 tablet (50 mg total) by mouth daily. Take with or immediately following a meal. 10/21/15  Yes Deboraha Sprang, MD  Multiple Vitamin (MULTIVITAMIN) capsule Take 1 capsule by mouth daily.   Yes Historical Provider, MD  nitroGLYCERIN (NITROLINGUAL) 0.4 MG/SPRAY spray Place 1 spray under the tongue every 5 (five) minutes x 3 doses as needed for chest pain. 08/28/15  Yes Brittainy Erie Noe, PA-C  pantoprazole (PROTONIX) 40 MG tablet Take 1 tablet (40 mg total) by mouth 2 (two) times daily before a meal. 08/28/15  Yes Brittainy Erie Noe, PA-C  Polyethyl Glycol-Propyl Glycol (SYSTANE) 0.4-0.3 % SOLN Place 1 drop into both eyes at bedtime.    Yes Historical Provider, MD  predniSONE (DELTASONE) 5 MG tablet Take 2.5 mg daily 12/26/15  Yes Ladell Pier, MD  torsemide (DEMADEX) 20 MG tablet Take 1 tablet (20 mg total) by mouth 2 (two) times daily. 10/21/15  Yes Deboraha Sprang, MD  UBIQUINOL PO Take 1 tablet by mouth daily. CoQ10   Yes Historical Provider, MD  warfarin (COUMADIN) 5 MG tablet Take 1 tablet (5  mg total) by mouth daily at 6 PM. Except every MWF take half tablet (2.5 mg) 12/12/15  Yes Gary B Sherrill, MD   Allergies   Allergen Reactions  . Penicillins Swelling  . Simvastatin Other (See Comments)    Muscle weakness in legs ?   Review of Systems  Constitutional: Positive for activity change and fatigue.  Neurological: Positive for weakness.    Physical Exam  Constitutional: He is oriented to person, place, and time. He appears well-developed and well-nourished.  HENT:  Head: Normocephalic and atraumatic.  Cardiovascular: Normal rate.   Pulmonary/Chest: Effort normal. No accessory muscle usage. No tachypnea. No respiratory distress.  Abdominal: Soft. Normal appearance.  Neurological: He is alert and oriented to person, place, and time.    Vital Signs: BP 130/58 mmHg  Pulse 67  Temp(Src) 99.2 F (37.3 C) (Oral)  Resp 16  Ht 5' 10" (1.778 m)  Wt 106.6 kg (235 lb 0.2 oz)  BMI 33.72 kg/m2  SpO2 96% Pain Assessment: No/denies pain   Pain Score: 0-No pain   SpO2: SpO2: 96 % O2 Device:SpO2: 96 % O2 Flow Rate: .O2 Flow Rate (L/min): 2 L/min  IO: Intake/output summary:  Intake/Output Summary (Last 24 hours) at 01/15/16 1425 Last data filed at 01/15/16 0657  Gross per 24 hour  Intake 2098.75 ml  Output    575 ml  Net 1523.75 ml    LBM: Last BM Date: 01/15/16 Baseline Weight: Weight: 103.7 kg (228 lb 9.9 oz) Most recent weight: Weight: 106.6 kg (235 lb 0.2 oz)     Palliative Assessment/Data:   Discussed with Dr. Mikhail and Dr. Sherrill.   Time In: 1300 Time Out: 1430 Time Total: 90min Greater than 50%  of this time was spent counseling and coordinating care related to the above assessment and plan.  Signed by: ,  C, NP   Please contact Palliative Medicine Team phone at [phone] for questions and concerns.  For individual provider: See Amion              

## 2016-01-15 NOTE — Progress Notes (Signed)
ANTICOAGULATION CONSULT NOTE - Initial Consult  Pharmacy Consult for Warfarin Indication: DVT/PE (diagnosed Sept 2016)  Allergies  Allergen Reactions  . Penicillins Swelling  . Simvastatin Other (See Comments)    Muscle weakness in legs ?    Patient Measurements: Height: 5\' 10"  (177.8 cm) Weight: 235 lb 0.2 oz (106.6 kg) IBW/kg (Calculated) : 73  Vital Signs: Temp: 99.2 F (37.3 C) (06/21 0550) Temp Source: Oral (06/21 0550) BP: 127/59 mmHg (06/21 0550) Pulse Rate: 75 (06/21 0550)  Labs:  Recent Labs  01/13/16 0346 01/13/16 0824 01/14/16 0405 01/15/16 0342  HGB  --  9.6* 10.0* 10.0*  HCT  --  28.9* 30.4* 30.6*  PLT  --  126* 131* 133*  LABPROT 32.6*  --  30.1* 26.4*  INR 3.40*  --  3.05* 2.47*  CREATININE  --  2.34* 2.07* 1.91*    Estimated Creatinine Clearance: 33.9 mL/min (by C-G formula based on Cr of 1.91).   Medical History: Past Medical History  Diagnosis Date  . Prostate cancer ([place])   . Neuropathy ([place])   . Hypercholesteremia   . Depressive disorder, not elsewhere classified   . Sinoatrial node dysfunction ([facility])   . Degeneration of lumbar or lumbosacral intervertebral disc   . CAD (coronary artery disease)      NYHA Class 1B, CABG 1985  . HTN (hypertension)   . OSA on CPAP   . Collagen vascular disease ([place])   . PE (pulmonary embolism) 03/2015  . Neuromuscular disorder (H[facility])     neuropathy  . GERD (gastroesophageal reflux disease)   . Presence of permanent cardiac pacemaker     Medications:  Scheduled:  . abiraterone Acetate  1,000 mg Oral Daily  . atorvastatin  40 mg Oral QODAY  . cholecalciferol  4,000 Units Oral Daily  . citalopram  20 mg Oral Daily  . docusate sodium  100 mg Oral BID  . folic acid  1 mg Oral Daily  . gabapentin  300 mg Oral BID  . isosorbide mononitrate  30 mg Oral Daily  . metoprolol succinate  50 mg Oral Daily  . multivitamin with minerals  1 tablet Oral Daily  . pantoprazole  40 mg Oral BID AC  . polyvinyl  alcohol  1 drop Both Eyes QHS  . predniSONE  2.5 mg Oral Q breakfast  . torsemide  20 mg Oral BID  . vitamin B-12  1,000 mcg Oral Daily  . Warfarin - Pharmacist Dosing Inpatient   Does not apply q1800   Infusions:  . 0.9 % NaCl with KCl 40 mEq / L 75 mL/hr (01/15/16 0654)    Assessment: 82 yr male brought to ED with lethargy and reports of frequent falls at home this week.  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  PTA warfarin 5mg  daily except 2.5mg  MWF - last dose taken at home on 01/12/16, INR on admission 3.37 (supratherapeutic)  Today, 01/15/2016   INR 2.46, down from 3.05 from previous day after no warfarin given  Hgb, plt stable  No bleeding issues noted  50-100% of meals eaten   Goal of Therapy:  INR 2-3   Plan:   Warfarin 2.5 mg PO x 1  Check daily INR    Royetta Asal, PharmD, BCPS Pager [phone] 01/15/2016 9:58 AM

## 2016-01-15 NOTE — Clinical Social Work Note (Signed)
Clinical Social Work Assessment  Patient Details  Name: Edward Woodward MRN: [id number] Date of Birth: 1927/02/08  Date of referral:  01/15/16               Reason for consult:  Facility Placement, Discharge Planning                Permission sought to share information with:  Chartered certified accountant granted to share information::  Yes, Verbal Permission Granted  Name::        Agency::     Relationship::     Contact Information:     Housing/Transportation Living arrangements for the past 2 months:  Single Family Home Source of Information:  Spouse Patient Interpreter Needed:  None Criminal Activity/Legal Involvement Pertinent to Current Situation/Hospitalization:  No - Comment as needed Significant Relationships:  Adult Children, Spouse Lives with:  Spouse Do you feel safe going back to the place where you live?   (SNF recommended.) Need for family participation in patient care:  Yes (Comment)  Care giving concerns:  Pt's care cannot be managed at home following hospital d/c.   Social Worker assessment / plan: Pt hospitalized on 01/12/16 with acute encephalopathy. PN reviewed. PT has recommended SNF at d/c. MD ordered Palliative Care consult today. CSW met with pt / spouse at bedside to assist with d/c planning needs. Pt greeting CSW then went back to sleep. Spouse agrees with plan for SNF placement pending Palliative Care recommendations. SNF search has been initiated and bed offers provided. CSW will continue to follow to assist with d/c planning needs.  Employment status:  Retired Forensic scientist:  Medicare PT Recommendations:  [employer] / Referral to community resources:  [place]  Patient/Family's Response to care:  Spouse feels rehab is needed prior to returning home.  Patient/Family's Understanding of and Emotional Response to Diagnosis, Current Treatment, and Prognosis:  Spouse is aware of pt's medical  status. " Palliative Care is going to meet with me today.  I'm not sure if that changes the plan for rehab. " Spouse feels pt is more alert / oriented today. " He seems more like himself. " Emotional support provided.  Emotional Assessment Appearance:  Appears stated age Attitude/Demeanor/Rapport:  Other (cooperative) Affect (typically observed):  Calm, Appropriate, Pleasant Orientation:  Oriented to Self, Oriented to Place Alcohol / Substance use:  Not Applicable Psych involvement (Current and /or in the community):  No (Comment)  Discharge Needs  Concerns to be addressed:  Discharge Planning Concerns Readmission within the last 30 days:  No Current discharge risk:  None Barriers to Discharge:  No Barriers Identified   Loraine Maple  [phone] 01/15/2016, 11:53 AM

## 2016-01-15 NOTE — Progress Notes (Signed)
Occupational Therapy Evaluation Patient Details Name: Edward Woodward MRN: [id number] DOB: 09-22-1926 Today's Date: 01/15/2016    History of Present Illness 82 yo male admitted with acute encephalopathy, falls. Hx of prostate cancer, NHL, CAD, CABG, A fib, PE, pacemaker, neuropathy, HTN, NM d/o   Clinical Impression   Patient presents to OT with decreased ADL independence and safety due to the deficits listed below. He will benefit from skilled OT to maximize function and to facilitate a safe discharge. OT will follow.  Patient has some cognitive deficits and remains at high risk for falls. Decreased safety awareness and insight into current condition-thinks he can do more than he actually can. Bil LE instability/jerking with attempt at ambulation.     Follow Up Recommendations  SNF    Equipment Recommendations  Other (comment) (tbd next venue of care)    Recommendations for Other Services       Precautions / Restrictions Precautions Precautions: Fall Precaution Comments: high fall risk Restrictions Weight Bearing Restrictions: No      Mobility Bed Mobility Overal bed mobility: Needs Assistance Bed Mobility: Supine to Sit     Supine to sit: Mod assist;+2 for physical assistance;+2 for safety/equipment;HOB elevated Sit to supine: HOB elevated   General bed mobility comments: Assist for bil LEs and trunk. Multimodal cueing for required. Increased time. Utilized bedpad for scooting, positioning. Posterior lean.  Transfers Overall transfer level: Needs assistance Equipment used: Rolling walker (2 wheeled) Transfers: Sit to/from [facility] Sit to Stand: Mod assist;+2 physical assistance;+2 safety/equipment;From elevated surface Stand pivot transfers: Mod assist;+2 physical assistance;+2 safety/equipment;From elevated surface       General transfer comment: Assist to rise, stabilize, control descent. Stand pivot to bsc with RW. VCs safety.      Balance           Standing balance support: Bilateral upper extremity supported;During functional activity Standing balance-Leahy Scale: Poor                              ADL Overall ADL's : Needs assistance/impaired         Upper Body Bathing: Maximal assistance;Sitting   Lower Body Bathing: Total assistance;Bed level   Upper Body Dressing : Maximal assistance;Total assistance;Sitting   Lower Body Dressing: Total assistance;Bed level   Toilet Transfer: Moderate assistance;+2 for physical assistance;+2 for safety/equipment;Cueing for safety;Cueing for sequencing;Stand-pivot;BSC;RW   Toileting- Clothing Manipulation and Hygiene: Total assistance;+2 for physical assistance;+2 for safety/equipment;Sit to/from stand       Functional mobility during ADLs: Moderate assistance;+2 for physical assistance;+2 for safety/equipment;Cueing for safety;Cueing for sequencing;Rolling walker       Vision     Perception     Praxis      Pertinent Vitals/Pain Pain Assessment: No/denies pain     Hand Dominance Right   Extremity/Trunk Assessment Upper Extremity Assessment Upper Extremity Assessment: Generalized weakness   Lower Extremity Assessment Lower Extremity Assessment: Defer to PT evaluation   Cervical / Trunk Assessment Cervical / Trunk Assessment: Normal   Communication Communication Communication: HOH   Cognition Arousal/Alertness: Awake/alert Behavior During Therapy: WFL for tasks assessed/performed Overall Cognitive Status: Impaired/Different from baseline Area of Impairment: Orientation;Problem solving;Safety/judgement Orientation Level: Place   Memory: Decreased short-term memory   Safety/Judgement: Decreased awareness of safety;Decreased awareness of deficits   Problem Solving: Requires verbal cues;Requires tactile cues     General Comments       Exercises       Shoulder  Instructions      Home Living Family/patient expects to be  discharged to:: Unsure Living Arrangements: Spouse/significant other Available Help at Discharge: Family;Available 24 hours/day Type of Home: House Home Access: Stairs to enter [employer] of Steps: 1   Home Layout: One level                   Additional Comments: info taken from previous admission      Prior Functioning/Environment Level of Independence: Independent with assistive device(s)        Comments: spouse reports pt independent with BADLs, uses RW    OT Diagnosis: Generalized weakness;Cognitive deficits   OT Problem List: Decreased strength;Decreased range of motion;Decreased activity tolerance;Impaired balance (sitting and/or standing);Decreased cognition;Decreased safety awareness;Decreased knowledge of use of DME or AE;Decreased knowledge of precautions;Pain   OT Treatment/Interventions: Self-care/ADL training;Therapeutic exercise;DME and/or AE instruction;Therapeutic activities;Patient/family education    OT Goals(Current goals can be found in the care plan section) Acute Rehab OT Goals Patient Stated Goal: none stated OT Goal Formulation: With patient/family Time For Goal Achievement: 01/29/16 Potential to Achieve Goals: Good ADL Goals Pt Will Perform Grooming: with set-up;sitting Pt Will Perform Upper Body Bathing: with min assist;sitting Pt Will Perform Lower Body Bathing: with mod assist;sit to/from stand Pt Will Perform Upper Body Dressing: with min assist;sitting Pt Will Perform Lower Body Dressing: with mod assist;sit to/from stand Pt Will Transfer to Toilet: with min assist;bedside commode Pt Will Perform Toileting - Clothing Manipulation and hygiene: with mod assist;sit to/from stand  OT Frequency: Min 2X/week   Barriers to D/C:            Co-evaluation PT/OT/SLP Co-Evaluation/Treatment: Yes Reason for Co-Treatment: For patient/therapist safety PT goals addressed during session: Mobility/safety with mobility OT goals  addressed during session: ADL's and self-care      End of Session Equipment Utilized During Treatment: Gait belt;Rolling walker Nurse Communication: Mobility status  Activity Tolerance: Patient tolerated treatment well Patient left: in chair;with call bell/phone within reach;with chair alarm set;with family/visitor present   Time: 1142-1206 OT Time Calculation (min): 24 min Charges:  OT General Charges $OT Visit: 1 Procedure OT Evaluation $OT Eval Moderate Complexity: 1 Procedure G-Codes:    Velora Horstman A 2016/01/29, 12:59 PM

## 2016-01-16 DIAGNOSIS — Z515 Encounter for palliative care: Secondary | ICD-10-CM

## 2016-01-16 LAB — PROTIME-INR
INR: 2.17 — AB (ref 0.00–1.49)
PROTHROMBIN TIME: 24 s — AB (ref 11.6–15.2)

## 2016-01-16 LAB — BASIC METABOLIC PANEL
ANION GAP: 8 (ref 5–15)
BUN: 46 mg/dL — ABNORMAL HIGH (ref 6–20)
CALCIUM: 8.7 mg/dL — AB (ref 8.9–10.3)
CO2: 29 mmol/L (ref 22–32)
Chloride: 104 mmol/L (ref 101–111)
Creatinine, Ser: 2.03 mg/dL — ABNORMAL HIGH (ref 0.61–1.24)
GFR calc Af Amer: 32 mL/min — ABNORMAL LOW (ref >60)
GFR, EST NON AFRICAN AMERICAN: 28 mL/min — AB (ref >60)
Glucose, Bld: 111 mg/dL — ABNORMAL HIGH (ref 65–99)
POTASSIUM: 3.7 mmol/L (ref 3.5–5.1)
SODIUM: 141 mmol/L (ref 135–145)

## 2016-01-16 MED ORDER — FLUTICASONE PROPIONATE 50 MCG/ACT NA SUSP
1.0000 | Freq: Two times a day (BID) | NASAL | Status: DC
  Administered 2016-01-16 – 2016-01-17 (×3): 1 via NASAL
  Filled 2016-01-16: qty 16

## 2016-01-16 MED ORDER — MAGIC MOUTHWASH W/LIDOCAINE
5.0000 mL | Freq: Four times a day (QID) | ORAL | Status: DC | PRN
  Administered 2016-01-16: 5 mL via ORAL
  Filled 2016-01-16 (×2): qty 5

## 2016-01-16 MED ORDER — WARFARIN SODIUM 5 MG PO TABS
5.0000 mg | ORAL_TABLET | Freq: Once | ORAL | Status: AC
  Administered 2016-01-16: 5 mg via ORAL
  Filled 2016-01-16: qty 1

## 2016-01-16 NOTE — Progress Notes (Signed)
Palliative:  Edward Woodward is lying in bed and his son-in-law is at bedside. He is tired. His wife, [name], and daughter, [name], are out looking at [facility] rehab facilities for him. They have no questions/concerns at this time. I left [facility] booklet for [name] at bedside. I will follow up tomorrow for any further questions/concerns.   Vinie Sill, NP Palliative Medicine Team Pager # [phone] (M-F 8a-5p) Team Phone # [phone] (Nights/Weekends)

## 2016-01-16 NOTE — Progress Notes (Signed)
PROGRESS NOTE    BASSEM BOUMAN  [id number] DOB: 08/10/1926 DOA: 01/12/2016 PCP: Mathews Argyle, MD   Brief Narrative:  HPI on 01/13/2016 by Dr. Christia Reading Opyd Edward Woodward is a 82 y.o. male with medical history significant for prostate cancer, non-Hodgkin's lymphoma on chemotherapy, depression, CAD status post CABG, atrial fibrillation on Coumadin, and sinus node dysfunction with pacer who presents to the ED with worsening lethargy, confusion, and falls. Patient has reportedly been in declining health for quite some time with decreasing appetite and increasing weakness. This has been significantly amplified in recent days. Several days ago, he was ambulating by himself, feeding himself, and cogent. Now, he is brought in by his wife and daughter with lethargy, inability to ambulate even with assistance, severe upper extremity tremor, confusion, and falls. Patient is on Coumadin, but denies striking his head during any of these falls or losing consciousness. He has some superficial abrasions on the lower extremities, but denies any other significant injury or pain. Patient denies any recent fevers, chills, chest pain, palpitations, dyspnea, or cough. He denies abdominal pain, nausea, vomiting, or diarrhea.   Assessment & Plan   Acute encephalopathy  -Resolved -Unknown etiology; suspected toxic-metabolic in setting of electrolyte derangements and AKI  -Head CT negative for acute intracranial abnormality -Ammonia normal -UA and CXR unremarkable for infection -Patient currently afebrile with no leukocytosis  AKI superimposed on CKD stage III  -Cr 2.34 on admission, up from 1.7 in May and 1.2 in February 2017  -Suspect a post-renal etiology secondary to extrinsic compression of bilateral ureters by retroperitoneal mass  -Creatinine improving, 2.03 today -Avoiding nephrotoxins, holding diuretics  -Monitor intake/output  Moderate hydronephrosis -Noted on CT abd/pelvis,  increased from prior study. Likely related to adhesion/compression of proximal ureters by retroperitoneal mass -Urology consulted and appreciated. Patient was seeing Dr. Rosana Hoes in the past.  -Spoke with Dr. Gaynelle Arabian, unlikely that placing stents will change patient's QOL.  Family has opted to not pursue procedures at this time. -Palliative care consulted- will follow at SNF  Atrial fibrillation with supratherapeutic INR -CHADS-VASc at least 87 (age x2, HTN, CHF) -On AC with coumadin, INR 3.37 on admission -INR 2.47 today -Continue coumadin per pharmacy -Continue metoprolol   Non-Hodgkin lymphoma, prostate cancer  -Followed by oncology with ongoing chemo for NHL; hormone therapy for prostate CA with recent increase in PSA per notes  -Retroperitoneal mass with interval enlargement noted on CT abd/pel  -Ongoing management per oncology team- Dr. Benay Spice made aware of patient's admission  OSA  -Continue CPAP QHS  Hypertension  -Continue Toprol, hold diuretics for now in setting of AKI   Normocytic anemia -Hemoglobin 10 and stable  -No sign of active losses  -Continue to monitor CBC  Hypokalemia  -resolved, potassium 3.7 today -Serum potassium 2.7 on admission  -Suspected secondary to poor PO intake and loop diuretics -Continue to replace and montior BMP -Magnesium 2  Chronic diastolic CHF  -TTE (123XX123) with EF 60-65%, moderate concentric hypertrophy, grade 1 diastolic dysfunction, mild LAE  -Managed with torsemide, metolazone, and metoprolol at home -Holding diuretics for now while gently hydrating  -Follow strict I/O's, daily wts; fluid-restrict diet, resume diuretics as appropriate  -UOP 1500cc over past 24 hours  Deconditioning and frequent falls -Spoke with wife regarding coumadin therapy and the risk of bleeding especially with falls.  -PT consulted and recommended SNF  Diarrhea  -Patient was given colace, and developed diarrhea. Cdiff was  negative.  Sore throat -Will order flonase as well as  swish and swallow -No oral thrush noted, no erythema noted in the oropharynx  -Continue Chloraseptic Spray  DVT Prophylaxis  Coumadin  Code Status: DNR  Family Communication: Wife at bedside  Disposition Plan: Admitted. Pending SNF  Consultants Oncology Urology Palliative care  Procedures  None  Antibiotics   Anti-infectives    None      Subjective:   [name] seen and examined today.  Patient complains of sore throat and pain with swallowing. Denies chest pain, shortness of breath, abdominal pain, dizziness, headache.   Objective:   Filed Vitals:   01/15/16 1430 01/15/16 2056 01/16/16 0517 01/16/16 0524  BP: 113/60 108/65 142/64 138/98  Pulse: 65 70 64 89  Temp: 98 F (36.7 C) 98.2 F (36.8 C) 97.9 F (36.6 C) 98.2 F (36.8 C)  TempSrc: Oral Oral Axillary Oral  Resp: 18 16 15    Height:      Weight:      SpO2: 100% 94% 96% 96%    Intake/Output Summary (Last 24 hours) at 01/16/16 1107 Last data filed at 01/16/16 0916  Gross per 24 hour  Intake    240 ml  Output   1800 ml  Net  -1560 ml   Filed Weights   01/14/16 0649 01/15/16 0550  Weight: 103.7 kg (228 lb 9.9 oz) 106.6 kg (235 lb 0.2 oz)    Exam  General: Well developed, well nourished, NAD  HEENT: NCAT,mucous membranes moist. No erythema or thrush noted  Cardiovascular: S1 S2 auscultated,  2/6 SEM, RRR  Respiratory: Clear to auscultation  Abdomen: Soft, nontender, nondistended, + bowel sounds  Extremities: warm dry without cyanosis clubbing. LE edema. Abrasions on LE  Neuro: AAOx 3 (place, person, time), nonfocal  Psych: Appropriate   Data Reviewed: I have personally reviewed following labs and imaging studies  CBC:  Recent Labs Lab 01/12/16 1959 01/12/16 2009 01/13/16 0824 01/14/16 0405 01/15/16 0342  WBC 7.3  --  7.1 8.0 7.6  NEUTROABS 5.1  --   --   --   --   HGB 10.8* 11.2* 9.6* 10.0* 10.0*  HCT 31.9* 33.0*  28.9* 30.4* 30.6*  MCV 92.2  --  94.8 95.0 96.2  PLT 136*  --  126* 131* [id number]*   Basic Metabolic Panel:  Recent Labs Lab 01/12/16 1959 01/12/16 2009 01/13/16 0824 01/14/16 0405 01/15/16 0342 01/16/16 0333  NA 139 138 140 140 142 141  K 2.7* 2.7* 2.8* 3.6 3.5 3.7  CL 94* 91* 96* 100* 104 104  CO2 35*  --  34* 32 30 29  GLUCOSE 126* 121* 110* 112* 115* 111*  BUN 55* 46* 53* 53* 45* 46*  CREATININE 2.34* 2.30* 2.34* 2.07* 1.91* 2.03*  CALCIUM 10.0  --  9.4 9.2 9.0 8.7*  MG 2.0  --   --   --   --   --    GFR: Estimated Creatinine Clearance: 31.9 mL/min (by C-G formula based on Cr of 2.03). Liver Function Tests:  Recent Labs Lab 01/12/16 1959  AST 22  ALT 14*  ALKPHOS 69  BILITOT 0.9  PROT 6.7  ALBUMIN 4.0   No results for input(s): LIPASE, AMYLASE in the last 168 hours.  Recent Labs Lab 01/12/16 1959  AMMONIA 17   Coagulation Profile:  Recent Labs Lab 01/12/16 1959 01/13/16 0346 01/14/16 0405 01/15/16 0342 01/16/16 0333  INR 3.37* 3.40* 3.05* 2.47* 2.17*   Cardiac Enzymes: No results for input(s): CKTOTAL, CKMB, CKMBINDEX, TROPONINI in the last 168 hours. BNP (last  3 results) No results for input(s): PROBNP in the last 8760 hours. HbA1C: No results for input(s): HGBA1C in the last 72 hours. CBG:  Recent Labs Lab 01/12/16 1949 01/14/16 0808  GLUCAP 122* 112*   Lipid Profile: No results for input(s): CHOL, HDL, LDLCALC, TRIG, CHOLHDL, LDLDIRECT in the last 72 hours. Thyroid Function Tests: No results for input(s): TSH, T4TOTAL, FREET4, T3FREE, THYROIDAB in the last 72 hours. Anemia Panel: No results for input(s): VITAMINB12, FOLATE, FERRITIN, TIBC, IRON, RETICCTPCT in the last 72 hours. Urine analysis:    Component Value Date/Time   COLORURINE YELLOW 01/12/2016 2046   APPEARANCEUR CLEAR 01/12/2016 2046   LABSPEC 1.010 01/12/2016 2046   PHURINE 5.5 01/12/2016 2046   GLUCOSEU NEGATIVE 01/12/2016 2046   HGBUR NEGATIVE 01/12/2016 2046    BILIRUBINUR NEGATIVE 01/12/2016 2046   KETONESUR NEGATIVE 01/12/2016 2046   PROTEINUR NEGATIVE 01/12/2016 2046   NITRITE NEGATIVE 01/12/2016 2046   LEUKOCYTESUR NEGATIVE 01/12/2016 2046   Sepsis Labs: @LABRCNTIP (procalcitonin:4,lacticidven:4)  ) Recent Results (from the past 240 hour(s))  C difficile quick scan w PCR reflex     Status: None   Collection Time: 01/15/16  1:14 AM  Result Value Ref Range Status   C Diff antigen NEGATIVE NEGATIVE Final   C Diff toxin NEGATIVE NEGATIVE Final   C Diff interpretation Negative for toxigenic C. difficile  Final      Radiology Studies: No results found.   Scheduled Meds: . abiraterone Acetate  1,000 mg Oral Daily  . atorvastatin  40 mg Oral QODAY  . cholecalciferol  4,000 Units Oral Daily  . citalopram  20 mg Oral Daily  . docusate sodium  100 mg Oral BID  . fluticasone  1 spray Each Nare BID  . folic acid  1 mg Oral Daily  . gabapentin  300 mg Oral BID  . isosorbide mononitrate  30 mg Oral Daily  . metoprolol succinate  50 mg Oral Daily  . multivitamin with minerals  1 tablet Oral Daily  . pantoprazole  40 mg Oral BID AC  . polyvinyl alcohol  1 drop Both Eyes QHS  . predniSONE  2.5 mg Oral Q breakfast  . torsemide  20 mg Oral BID  . vitamin B-12  1,000 mcg Oral Daily  . warfarin  5 mg Oral ONCE-1800  . Warfarin - Pharmacist Dosing Inpatient   Does not apply q1800   Continuous Infusions:     LOS: 3 days   Time Spent in minutes   30 minutes  Caiden Monsivais D.O. on 01/16/2016 at 11:07 AM  Between 7am to 7pm - Pager - [phone]  After 7pm go to [email or web address] - password [email or web address]  And look for the night coverage person covering for me after hours  [facility] Office  [phone]

## 2016-01-16 NOTE — Care Management Important Message (Signed)
Important Message  Patient Details  Name: Edward Woodward MRN: [id number] Date of Birth: August 02, 1926   Medicare Important Message Given:  Yes    Camillo Flaming 01/16/2016, 8:59 AMImportant Message  Patient Details  Name: Edward Woodward MRN: [id number] Date of Birth: 18-Jun-1927   Medicare Important Message Given:  Yes    Camillo Flaming 01/16/2016, 8:58 AM

## 2016-01-16 NOTE — Care Management Note (Signed)
Case Management Note  Patient Details  Name: SUMEDH CARELOCK MRN: [id number] Date of Birth: [birth year]  Subjective/Objective:   82 yo admitted with Encephalopathy                 Action/Plan: From home with spouse and to discharge to SNF.  Expected Discharge Date:                  Expected Discharge Plan:  Skilled Nursing Facility  In-House Referral:  Clinical Social Work  Discharge planning Services  CM Consult  Post Acute Care Choice:    Choice offered to:     DME Arranged:    DME Agency:     HH Arranged:    [place] Agency:     Status of Service:  Completed, signed off  If discussed at [employer], dates discussed:    Additional CommentsLynnell Catalan, RN 01/16/2016, 3:14 PM [phone]

## 2016-01-16 NOTE — Progress Notes (Signed)
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 Penicillins Swelling  . Simvastatin Other (See Comments)    Muscle weakness in legs ?    Patient Measurements: Height: _0  (177.8 cm) Weight: 235 lb 0.2 oz (106.6 kg) IBW/kg (Calculated) : 73  Vital Signs: Temp: 98.2 F (36.8 C) (06/22 0524) Temp Source: Oral (06/22 0524) BP: 138/98 mmHg (06/22 0524) Pulse Rate: 89 (06/22 0524)  Labs:  Recent Labs  01/14/16 0405 01/15/16 0342 01/16/16 0333  HGB 10.0* 10.0*  --   HCT 30.4* 30.6*  --   PLT 131* 133*  --   LABPROT 30.1* 26.4* 24.0*  INR 3.05* 2.47* 2.17*  CREATININE 2.07* 1.91* 2.03*    Estimated Creatinine Clearance: 31.9 mL/min (by C-G formula based on Cr of 2.03).    Medications:  Scheduled:  . abiraterone Acetate  1,000 mg Oral Daily  . atorvastatin  40 mg Oral QODAY  . cholecalciferol  4,000 Units Oral Daily  . citalopram  20 mg Oral Daily  . docusate sodium  100 mg Oral BID  . fluticasone  1 spray Each Nare BID  . folic acid  1 mg Oral Daily  . gabapentin  300 mg Oral BID  . isosorbide mononitrate  30 mg Oral Daily  . metoprolol succinate  50 mg Oral Daily  . multivitamin with minerals  1 tablet Oral Daily  . pantoprazole  40 mg Oral BID AC  . polyvinyl alcohol  1 drop Both Eyes QHS  . predniSONE  2.5 mg Oral Q breakfast  . torsemide  20 mg Oral BID  . vitamin B-12  1,000 mcg Oral Daily  . Warfarin - Pharmacist Dosing Inpatient   Does not apply q1800   Infusions:     Assessment: 82 yr male brought to ED with lethargy and reports of frequent falls at home this week.  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  PTA warfarin 55m daily except 2.513mMWF - last dose taken at home on 01/12/16, INR on admission 3.37 (supratherapeutic)  Today, 01/16/2016   INR therapeutic (=2.17) but trending down from being  supratherapeutic 6/19, down from 3.05 from previous day after no warfarin given  CBC: 6/21 - Hgb, plt stable  No bleeding issues noted  50-100% of meals eaten  Met with palliative care 6/21 and appears patient wishes to discuss risk vs benefit of continuing warfarin  Goal of Therapy:  INR 2-3   Plan:   Warfarin 5 mg PO x 1 as per home dose  Check daily INR   Follow-up patient/oncology decision regarding continuing warfarin therapy   DuDoreene ElandPharmD, BCPS.   Pager: [phone]/22/2017 10:00 AM

## 2016-01-17 ENCOUNTER — Ambulatory Visit: Payer: Medicare Other | Admitting: Oncology

## 2016-01-17 ENCOUNTER — Other Ambulatory Visit: Payer: Medicare Other

## 2016-01-17 ENCOUNTER — Telehealth: Payer: Self-pay | Admitting: Pharmacist

## 2016-01-17 DIAGNOSIS — R41841 Cognitive communication deficit: Secondary | ICD-10-CM | POA: Diagnosis not present

## 2016-01-17 DIAGNOSIS — N179 Acute kidney failure, unspecified: Secondary | ICD-10-CM | POA: Diagnosis not present

## 2016-01-17 DIAGNOSIS — G4733 Obstructive sleep apnea (adult) (pediatric): Secondary | ICD-10-CM | POA: Diagnosis not present

## 2016-01-17 DIAGNOSIS — I10 Essential (primary) hypertension: Secondary | ICD-10-CM | POA: Diagnosis not present

## 2016-01-17 DIAGNOSIS — I25810 Atherosclerosis of coronary artery bypass graft(s) without angina pectoris: Secondary | ICD-10-CM | POA: Diagnosis not present

## 2016-01-17 DIAGNOSIS — I481 Persistent atrial fibrillation: Secondary | ICD-10-CM | POA: Diagnosis not present

## 2016-01-17 DIAGNOSIS — R269 Unspecified abnormalities of gait and mobility: Secondary | ICD-10-CM | POA: Diagnosis not present

## 2016-01-17 DIAGNOSIS — R278 Other lack of coordination: Secondary | ICD-10-CM | POA: Diagnosis not present

## 2016-01-17 DIAGNOSIS — R531 Weakness: Secondary | ICD-10-CM | POA: Diagnosis not present

## 2016-01-17 DIAGNOSIS — Z5181 Encounter for therapeutic drug level monitoring: Secondary | ICD-10-CM | POA: Diagnosis not present

## 2016-01-17 DIAGNOSIS — R2689 Other abnormalities of gait and mobility: Secondary | ICD-10-CM | POA: Diagnosis not present

## 2016-01-17 DIAGNOSIS — C8590 Non-Hodgkin lymphoma, unspecified, unspecified site: Secondary | ICD-10-CM | POA: Diagnosis not present

## 2016-01-17 DIAGNOSIS — G9340 Encephalopathy, unspecified: Secondary | ICD-10-CM | POA: Diagnosis not present

## 2016-01-17 DIAGNOSIS — N183 Chronic kidney disease, stage 3 (moderate): Secondary | ICD-10-CM | POA: Diagnosis not present

## 2016-01-17 DIAGNOSIS — I4891 Unspecified atrial fibrillation: Secondary | ICD-10-CM | POA: Diagnosis not present

## 2016-01-17 DIAGNOSIS — I5033 Acute on chronic diastolic (congestive) heart failure: Secondary | ICD-10-CM | POA: Diagnosis not present

## 2016-01-17 DIAGNOSIS — R627 Adult failure to thrive: Secondary | ICD-10-CM | POA: Diagnosis not present

## 2016-01-17 DIAGNOSIS — I509 Heart failure, unspecified: Secondary | ICD-10-CM | POA: Diagnosis not present

## 2016-01-17 DIAGNOSIS — N1330 Unspecified hydronephrosis: Secondary | ICD-10-CM | POA: Diagnosis not present

## 2016-01-17 DIAGNOSIS — C8591 Non-Hodgkin lymphoma, unspecified, lymph nodes of head, face, and neck: Secondary | ICD-10-CM | POA: Diagnosis not present

## 2016-01-17 LAB — BASIC METABOLIC PANEL
Anion gap: 10 (ref 5–15)
BUN: 44 mg/dL — AB (ref 6–20)
CO2: 30 mmol/L (ref 22–32)
Calcium: 8.8 mg/dL — ABNORMAL LOW (ref 8.9–10.3)
Chloride: 102 mmol/L (ref 101–111)
Creatinine, Ser: 1.91 mg/dL — ABNORMAL HIGH (ref 0.61–1.24)
GFR calc Af Amer: 35 mL/min — ABNORMAL LOW (ref >60)
GFR, EST NON AFRICAN AMERICAN: 30 mL/min — AB (ref >60)
GLUCOSE: 89 mg/dL (ref 65–99)
POTASSIUM: 3.4 mmol/L — AB (ref 3.5–5.1)
Sodium: 142 mmol/L (ref 135–145)

## 2016-01-17 LAB — CBC
HEMATOCRIT: 27.3 % — AB (ref 39.0–52.0)
Hemoglobin: 9.1 g/dL — ABNORMAL LOW (ref 13.0–17.0)
MCH: 31.5 pg (ref 26.0–34.0)
MCHC: 33.3 g/dL (ref 30.0–36.0)
MCV: 94.5 fL (ref 78.0–100.0)
PLATELETS: 131 10*3/uL — AB (ref 150–400)
RBC: 2.89 MIL/uL — AB (ref 4.22–5.81)
RDW: 13.5 % (ref 11.5–15.5)
WBC: 6.9 10*3/uL (ref 4.0–10.5)

## 2016-01-17 LAB — GLUCOSE, CAPILLARY: Glucose-Capillary: 109 mg/dL — ABNORMAL HIGH (ref 65–99)

## 2016-01-17 LAB — PROTIME-INR
INR: 1.73 — ABNORMAL HIGH (ref 0.00–1.49)
Prothrombin Time: 20.3 seconds — ABNORMAL HIGH (ref 11.6–15.2)

## 2016-01-17 MED ORDER — POTASSIUM CHLORIDE 20 MEQ/15ML (10%) PO SOLN
40.0000 meq | Freq: Every day | ORAL | Status: DC
  Administered 2016-01-17: 40 meq via ORAL
  Filled 2016-01-17: qty 30

## 2016-01-17 MED ORDER — WARFARIN SODIUM 5 MG PO TABS: 5.0000 mg | ORAL_TABLET | Freq: Every day | ORAL | Status: DC

## 2016-01-17 MED ORDER — BENZOCAINE 10 % MT GEL: Freq: Four times a day (QID) | OROMUCOSAL | Status: DC | PRN

## 2016-01-17 MED ORDER — POTASSIUM CHLORIDE 20 MEQ/15ML (10%) PO SOLN: 10.0000 meq | Freq: Every day | ORAL | Status: DC

## 2016-01-17 MED ORDER — FLUTICASONE PROPIONATE 50 MCG/ACT NA SUSP: 1.0000 | Freq: Every day | NASAL | Status: DC

## 2016-01-17 MED ORDER — WARFARIN SODIUM 5 MG PO TABS: 5.0000 mg | ORAL_TABLET | Freq: Once | ORAL | Status: DC

## 2016-01-17 NOTE — Discharge Instructions (Signed)
Confusion Confusion is the inability to think with your usual speed or clarity. Confusion may come on quickly or slowly over time. How quickly the confusion comes on depends on the cause. Confusion can be due to any number of causes. CAUSES   Concussion, head injury, or head trauma.  Seizures.  Stroke.  Fever.  Brain tumor.  Age related decreased brain function (dementia).  Heightened emotional states like rage or terror.  Mental illness in which the person loses the ability to determine what is real and what is not (hallucinations).  Infections such as a urinary tract infection (UTI).  Toxic effects from alcohol, drugs, or prescription medicines.  Dehydration and an imbalance of salts in the body (electrolytes).  Lack of sleep.  Low blood sugar (diabetes).  Low levels of oxygen from conditions such as chronic lung disorders.  Drug interactions or other medicine side effects.  Nutritional deficiencies, especially niacin, thiamine, vitamin C, or vitamin B.  Sudden drop in body temperature (hypothermia).  Change in routine, such as when traveling or hospitalized. SIGNS AND SYMPTOMS  People often describe their thinking as cloudy or unclear when they are confused. Confusion can also include feeling disoriented. That means you are unaware of where or who you are. You may also not know what the date or time is. If confused, you may also have difficulty paying attention, remembering, and making decisions. Some people also act aggressively when they are confused.  DIAGNOSIS  The medical evaluation of confusion may include:  Blood and urine tests.  X-rays.  Brain and nervous system tests.  Analyzing your brain waves (electroencephalogram or EEG).  Magnetic resonance imaging (MRI) of your head.  Computed tomography (CT) scan of your head.  Mental status tests in which your health care provider may ask many questions. Some of these questions may seem silly or strange,  but they are a very important test to help diagnose and treat confusion. TREATMENT  An admission to the hospital may not be needed, but a person with confusion should not be left alone. Stay with a family member or friend until the confusion clears. Avoid alcohol, pain relievers, or sedative drugs until you have fully recovered. Do not drive until directed by your health care provider. HOME CARE INSTRUCTIONS  What family and friends can do:  To find out if someone is confused, ask the person to state his or her name, age, and the date. If the person is unsure or answers incorrectly, he or she is confused.  Always introduce yourself, no matter how well the person knows you.  Often remind the person of his or her location.  Place a calendar and clock near the confused person.  Help the person with his or her medicines. You may want to use a pill box, an alarm as a reminder, or give the person each dose as prescribed.  Talk about current events and plans for the day.  Try to keep the environment calm, quiet, and peaceful.  Make sure the person keeps follow-up visits with his or her health care provider. PREVENTION  Ways to prevent confusion:  Avoid alcohol.  Eat a balanced diet.  Get enough sleep.  Take medicine only as directed by your health care provider.  Do not become isolated. Spend time with other people and make plans for your days.  Keep careful watch on your blood sugar levels if you are diabetic. SEEK IMMEDIATE MEDICAL CARE IF:   You develop severe headaches, repeated vomiting, seizures, blackouts, or   slurred speech.  There is increasing confusion, weakness, numbness, restlessness, or personality changes.  You develop a loss of balance, have marked dizziness, feel uncoordinated, or fall.  You have delusions, hallucinations, or develop severe anxiety.  Your family members think you need to be rechecked.   This information is not intended to replace advice given  to you by your health care provider. Make sure you discuss any questions you have with your health care provider.   Document Released: 08/20/2004 Document Revised: 08/03/2014 Document Reviewed: 08/18/2013 Elsevier Interactive Patient Education 2016 [employer]  

## 2016-01-17 NOTE — Progress Notes (Signed)
Physical Therapy Treatment Patient Details Name: LORETTA ANDREOLI MRN: [id number] DOB: March 01, 1927 Today's Date: 01/17/2016    History of Present Illness 82 yo male admitted with acute encephalopathy, falls. Hx of prostate cancer, NHL, CAD, CABG, A fib, PE, pacemaker, neuropathy, HTN, NM d/o    PT Comments    Pt motivated and demonstrating marked improvement in activity tolerance and mobility.  Follow Up Recommendations  SNF     Equipment Recommendations  Wheelchair cushion (measurements PT);Wheelchair (measurements PT);Hospital bed;Rolling walker with 5" wheels    Recommendations for Other Services OT consult     Precautions / Restrictions Precautions Precautions: Fall Precaution Comments: high fall risk Restrictions Weight Bearing Restrictions: No    Mobility  Bed Mobility Overal bed mobility: Needs Assistance Bed Mobility: Supine to Sit     Supine to sit: Min guard     General bed mobility comments: Pt to EOB with increased time and min guard for balance  Transfers Overall transfer level: Needs assistance Equipment used: Rolling walker (2 wheeled) Transfers: Sit to/from Stand Sit to Stand: Min assist;+2 safety/equipment;From elevated surface         General transfer comment: Min assist to rise, stabilize, control descent. Cues for use of UEs to self assist.  Sit<>stand x 2  Ambulation/Gait Ambulation/Gait assistance: Min assist;+2 safety/equipment Ambulation Distance (Feet): 123 Feet Assistive device: Rolling walker (2 wheeled) Gait Pattern/deviations: Step-through pattern;Decreased step length - right;Decreased step length - left;Shuffle;Trunk flexed Gait velocity: decreased   General Gait Details: Cues for posture and position from RW.  Multiple brief standing rests to complete task   Stairs            Wheelchair Mobility    Modified Rankin (Stroke Patients Only)       Balance Overall balance assessment: Needs assistance Sitting-balance  support: No upper extremity supported;Feet supported Sitting balance-Leahy Scale: Good     Standing balance support: Bilateral upper extremity supported Standing balance-Leahy Scale: Poor                      Cognition Arousal/Alertness: Awake/alert Behavior During Therapy: WFL for tasks assessed/performed Overall Cognitive Status: Within Functional Limits for tasks assessed           Safety/Judgement: Decreased awareness of safety;Decreased awareness of deficits   Problem Solving: Requires verbal cues;Requires tactile cues      Exercises      General Comments        Pertinent Vitals/Pain Pain Assessment: No/denies pain    Home Living                      Prior Function            PT Goals (current goals can now be found in the care plan section) Acute Rehab PT Goals Patient Stated Goal: none stated PT Goal Formulation: With patient Time For Goal Achievement: 01/27/16 Potential to Achieve Goals: Good Progress towards PT goals: Progressing toward goals    Frequency  Min 3X/week    PT Plan Current plan remains appropriate    Co-evaluation             End of Session Equipment Utilized During Treatment: Gait belt Activity Tolerance: Patient tolerated treatment well Patient left: in chair;with call bell/phone within reach;with chair alarm set     Time: 1120-1140 PT Time Calculation (min) (ACUTE ONLY): 20 min  Charges:  $Gait Training: 8-22 mins  G Codes:      Holley Wirt 07-Feb-2016, 12:55 PM

## 2016-01-17 NOTE — Clinical Social Work Placement (Addendum)
   CLINICAL SOCIAL WORK PLACEMENT  NOTE  Date:  01/17/2016  Patient Details  Name: Edward Woodward MRN: [id number] Date of Birth: Jan 17, 1927  Clinical Social Work is seeking post-discharge placement for this patient at the [facility] level of care (*CSW will initial, date and re-position this form in  chart as items are completed):  Yes   Patient/family provided with [facility] Work Department's list of facilities offering this level of care within the geographic area requested by the patient (or if unable, by the patient's family).  Yes   Patient/family informed of their freedom to choose among providers that offer the needed level of care, that participate in Medicare, Medicaid or managed care program needed by the patient, have an available bed and are willing to accept the patient.  Yes   Patient/family informed of [facility]'s ownership interest in [facility] and [facility], as well as of the fact that they are under no obligation to receive care at these facilities.  PASRR submitted to EDS on 01/07/16     PASRR number received on 01/07/16     Existing PASRR number confirmed on       FL2 transmitted to all facilities in geographic area requested by pt/family on 01/07/16     FL2 transmitted to all facilities within larger geographic area on       Patient informed that his/her managed care company has contracts with or will negotiate with certain facilities, including the following:        Yes   Patient/family informed of bed offers received.  Patient chooses bed at [facility]     Physician recommends and patient chooses bed at      Patient to be transferred to [facility] on 01/17/16.  Patient to be transferred to facility by [facility]     Patient family notified on 01/17/16 of transfer.  Name of family member notified:  DAUGHTER / SPOUSE     PHYSICIAN       Additional Comment: Pt / family are in  agreement with d/c to [facility] today. [facility] transport is required. Medical necessity form completed. Spouse is aware out of pocket costs may be associated with [facility] transport.D/C Summary sent to SNF for review. Scripts included in d/c packet. # for report provided to nsg.   _______________________________________________ Luretha Rued, [place]  [phone] 01/17/2016, 3:51 PM

## 2016-01-17 NOTE — Discharge Summary (Addendum)
Physician Discharge Summary  Edward Woodward [id number] DOB: 10-23-1926 DOA: 01/12/2016  PCP: Mathews Argyle, MD  Admit date: 01/12/2016 Discharge date: 01/17/2016  Time spent: 45 minutes  Recommendations for Outpatient Follow-up:  Patient will be discharged to nursing facility for rehab. Continue physical and occupational therapy.  Patient will need to follow up with primary care provider within one week of discharge. Repeat CBC and BMP in one week.  Have INR checked in 2 days, as dose of coumadin may need to be readjusted.  Continue to follow up with oncology, Dr. Benay Spice.  Follow up with neurology if needed.  Patient should continue medications as prescribed.  Patient should follow a heart healthy diet.   Discharge Diagnoses:  Acute encephalopathy Acute kidney injury superimposed on CKD, stage III Moderate hydronephrosis Atrial fibrillation with supratherapeutic INR Non-Hodgkin's lymphoma, prostate cancer Obstructive sleep apnea Hypertension Normocytic anemia Hypokalemia Chronic diastolic heart failure Deconditioning and frequent falls Diarrhea Sore throat   Discharge Condition: Stable  Diet recommendation: Heart healthy  Filed Weights   01/14/16 0649 01/15/16 0550  Weight: 103.7 kg (228 lb 9.9 oz) 106.6 kg (235 lb 0.2 oz)    History of present illness:  on 01/13/2016 by Dr. Christia Reading Opyd Edward Woodward is a 82 y.o. male with medical history significant for prostate cancer, non-Hodgkin's lymphoma on chemotherapy, depression, CAD status post CABG, atrial fibrillation on Coumadin, and sinus node dysfunction with pacer who presents to the ED with worsening lethargy, confusion, and falls. Patient has reportedly been in declining health for quite some time with decreasing appetite and increasing weakness. This has been significantly amplified in recent days. Several days ago, he was ambulating by himself, feeding himself, and cogent. Now, he is brought in by his wife and  daughter with lethargy, inability to ambulate even with assistance, severe upper extremity tremor, confusion, and falls. Patient is on Coumadin, but denies striking his head during any of these falls or losing consciousness. He has some superficial abrasions on the lower extremities, but denies any other significant injury or pain. Patient denies any recent fevers, chills, chest pain, palpitations, dyspnea, or cough. He denies abdominal pain, nausea, vomiting, or diarrhea.   Hospital Course:  Acute encephalopathy  -Resolved -Unknown etiology; suspected toxic-metabolic in setting of electrolyte derangements and AKI  -Head CT negative for acute intracranial abnormality -Ammonia normal -UA and CXR unremarkable for infection -Patient currently afebrile with no leukocytosis  AKI superimposed on CKD stage III  -Cr 2.34 on admission, up from 1.7 in May and 1.2 in February 2017  -Suspect a post-renal etiology secondary to extrinsic compression of bilateral ureters by retroperitoneal mass  -Creatinine improving, 1.91 today -Monitor intake/output  Moderate hydronephrosis -Noted on CT abd/pelvis, increased from prior study. Likely related to adhesion/compression of proximal ureters by retroperitoneal mass -Urology consulted and appreciated. Patient was seeing Dr. Rosana Hoes in the past.  -Spoke with Dr. Gaynelle Arabian, unlikely that placing stents will change patient's QOL. Family has opted to not pursue procedures at this time. -Palliative care consulted- will follow at SNF  Atrial fibrillation with supratherapeutic INR -CHADS-VASc at least 4 (age x2, HTN, CHF) -On AC with coumadin, INR 3.37 on admission -Continue coumadin- 5mg  daily, INR to be rechecked in 2 days as dose will need to be adjusted. -Continue metoprolol   Non-Hodgkin lymphoma, prostate cancer  -Followed by oncology with ongoing chemo for NHL; hormone therapy for prostate CA with recent increase in PSA per notes   -Retroperitoneal mass with interval enlargement noted on CT  abd/pel  -Ongoing management per oncology team- Dr. Benay Spice made aware of patient's admission  OSA  -Continue CPAP QHS  Hypertension  -Continue Toprol, hold diuretics for now in setting of AKI   Normocytic anemia -Hemoglobin 9.1 and stable  -No sign of active losses  -repeat BMP in one week  Hypokalemia  -resolved, potassium 3.4 today -Serum potassium 2.7 on admission  -Suspected secondary to poor PO intake and loop diuretics -Magnesium 2 -Repeat BMP in one week  Chronic diastolic CHF  -TTE (123XX123) with EF 60-65%, moderate concentric hypertrophy, grade 1 diastolic dysfunction, mild LAE  -Managed with torsemide, metolazone, and metoprolol at home -Follow strict I/O's, daily wts; fluid-restrict diet, resume diuretics as appropriate  -UOP 2100cc over past 24 hours  Deconditioning and frequent falls -Spoke with wife regarding coumadin therapy and the risk of bleeding especially with falls.  -PT consulted and recommended SNF  Diarrhea  -Patient was given colace, and developed diarrhea. Cdiff was negative.  Sore throat -Improved, Continue flonase -No oral thrush noted, no erythema noted in the oropharynx  -Continue Chloraseptic Spray  Consultants Oncology Urology Palliative care  Procedures  None  Discharge Exam: Filed Vitals:   01/16/16 2233 01/17/16 0641  BP:  117/57  Pulse: 63 76  Temp:  98.3 F (36.8 C)  Resp: 18 18   Exam  General: Well developed, well nourished, NAD  HEENT: NCAT,mucous membranes moist.   Cardiovascular: S1 S2 auscultated, 2/6 SEM, RRR  Respiratory: Clear to auscultation  Abdomen: Soft, nontender, nondistended, + bowel sounds  Extremities: warm dry without cyanosis clubbing. LE edema. Abrasions on LE  Neuro: AAOx 3 (place, person, time), nonfocal  Psych: Appropriate mood and affect, pleasant  Discharge Instructions      Discharge Instructions     Discharge instructions    Complete by:  As directed   Patient will be discharged to nursing facility for rehab. Continue physical and occupational therapy.  Patient will need to follow up with primary care provider within one week of discharge. Repeat CBC and BMP in one week.  Have INR checked in 2 days, as dose of coumadin may need to be readjusted.  Continue to follow up with oncology, Dr. Benay Spice.  Follow up with neurology if needed.  Patient should continue medications as prescribed.  Patient should follow a heart healthy diet.            Medication List    TAKE these medications        abiraterone Acetate 250 MG tablet  Commonly known as:  ZYTIGA  Take 4 tablets (1,000 mg total) by mouth daily. Take on an empty stomach 1 hour before or 2 hours after a meal     atorvastatin 40 MG tablet  Commonly known as:  LIPITOR  Take 1 tablet (40 mg total) by mouth every other day.     benzocaine 10 % mucosal gel  Commonly known as:  ORAJEL  Use as directed in the mouth or throat 4 (four) times daily as needed for mouth pain.     citalopram 20 MG tablet  Commonly known as:  CELEXA  Take 20 mg by mouth daily.     fluticasone 50 MCG/ACT nasal spray  Commonly known as:  FLONASE  Place 1 spray into both nostrils daily.     folic acid 1 MG tablet  Commonly known as:  FOLVITE  Take 1 tablet (1 mg total) by mouth daily.     gabapentin 300 MG capsule  Commonly known  as:  NEURONTIN  Take 300 mg by mouth 2 (two) times daily. not to exceed 3 times daily     isosorbide mononitrate 30 MG 24 hr tablet  Commonly known as:  IMDUR  Take 1 tablet (30 mg total) by mouth daily.     Melatonin 3 MG Tabs  Take 3 mg by mouth at bedtime.     metolazone 2.5 MG tablet  Commonly known as:  ZAROXOLYN  Take 1 tablet (2.5 mg total) by mouth 2 (two) times a week. TAKE  1 TAB ON   Monday AND  Thursday     metoprolol succinate 50 MG 24 hr tablet  Commonly known as:  TOPROL-XL  Take 1 tablet (50 mg  total) by mouth daily. Take with or immediately following a meal.     multivitamin capsule  Take 1 capsule by mouth daily.     nitroGLYCERIN 0.4 MG/SPRAY spray  Commonly known as:  NITROLINGUAL  Place 1 spray under the tongue every 5 (five) minutes x 3 doses as needed for chest pain.     pantoprazole 40 MG tablet  Commonly known as:  PROTONIX  Take 1 tablet (40 mg total) by mouth 2 (two) times daily before a meal.     potassium chloride 20 MEQ/15ML (10%) Soln  Take 7.5 mLs (10 mEq total) by mouth daily.     predniSONE 5 MG tablet  Commonly known as:  DELTASONE  Take 2.5 mg daily     SYSTANE 0.4-0.3 % Soln  Generic drug:  Polyethyl Glycol-Propyl Glycol  Place 1 drop into both eyes at bedtime.     torsemide 20 MG tablet  Commonly known as:  DEMADEX  Take 1 tablet (20 mg total) by mouth 2 (two) times daily.     UBIQUINOL PO  Take 1 tablet by mouth daily. CoQ10     Vitamin B-12 CR 1000 MCG Tbcr  Take 1 tablet by mouth daily.     Vitamin D3 2000 units Tabs  Take 2 tablets by mouth daily.     warfarin 5 MG tablet  Commonly known as:  COUMADIN  Take 1 tablet (5 mg total) by mouth daily at 6 PM. Have INR rechecked in 2 days.       Allergies  Allergen Reactions  . Penicillins Swelling  . Simvastatin Other (See Comments)    Muscle weakness in legs ?   Follow-up Information    Follow up with Mathews Argyle, MD. Schedule an appointment as soon as possible for a visit in 1 week.   Specialty:  Internal Medicine   Why:  Hospital follow up   Contact information:   [address] [place] [address] [phone]        The results of significant diagnostics from this hospitalization (including imaging, microbiology, ancillary and laboratory) are listed below for reference.    Significant Diagnostic Studies: Ct Abdomen Pelvis Wo Contrast  01/12/2016  CLINICAL DATA:  82 year old male with right-sided abdominal pain. EXAM: CT ABDOMEN AND PELVIS  WITHOUT CONTRAST TECHNIQUE: Multidetector CT imaging of the abdomen and pelvis was performed following the standard protocol without IV contrast. COMPARISON:  Abdominal CT dated 05/01/2015 FINDINGS: Evaluation of this exam is limited in the absence of intravenous contrast. Linear atelectasis/scarring at the lung bases. There is coronary vascular calcification. Cardiac pacemaker wire noted. No intra-abdominal free air or free fluid. There multiple stones within the gallbladder. There is a stone at the neck of the gallbladder. There is no  pericholecystic fluid. Ultrasound may provide better evaluation of the gallbladder. The liver, pancreas, spleen, and the adrenal glands appear unremarkable. There is moderate bilateral renal atrophy. There is mild current bilateral hydronephrosis, progressed compared to the prior study. Multiple bilateral renal exophytic hypodense lesions are not characterized but may represent cysts. These lesions measure up to 4.2 cm in the inferior pole of the right kidney. There is apparent diffuse thickening of the bladder wall which may be partly related to underdistention. Cystitis is not excluded. Correlation with urinalysis recommended. The prostate and seminal vesicles are grossly unremarkable. Constipation. There is no evidence of bowel obstruction or active inflammation. The appendix is not visualized with certainty. There is advanced aortoiliac atherosclerotic disease. Evaluation of the vasculature is limited in the absence of intravenous contrast. There is retroperitoneal soft tissue density encasing in the abdominal aorta and the IVC. Findings may represent retroperitoneal fibrosis, or neoplastic processes such as metastatic disease, or lymphoma/ lymphoproliferative disease. There has been interval increase in the size of the retroperitoneal/periaortic soft tissue compared to the prior study. There is encasement of the common iliac arteries as well. The retroperitoneal soft tissue  measures approximately 8.4 x 4.5 cm (previously 7.7 x 3.7 cm) at the level of the renal arteries. There are top-normal mesenteric lymph nodes. There is an area of stranding in the right lower quadrant which is similar or slightly increased from prior study. Midline vertical anterior abdominal wall incisional scar. There is degenerative changes of the spine. Lower lumbar laminectomy and posterior fusion hardware. No acute fracture. IMPRESSION: Progression of the retroperitoneal/ periaortic soft tissue with encasement of the aorta. 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 Moderate bilateral hydronephrosis, increased from prior study, and likely related to adhesion/compression of the proximal ureters by the retroperitoneal mass. Cholelithiasis. Constipation. No evidence of bowel obstruction or active inflammation. Electronically Signed   By: Anner Crete M.D.   On: 01/12/2016 23:10   Dg Chest 2 View  01/12/2016  CLINICAL DATA:  Hypoxia, weakness and lethargy. Three falls in the past week. EXAM: CHEST  2 VIEW COMPARISON:  08/23/2015 FINDINGS: Sternotomy wires and left-sided pacemaker unchanged. Lungs are adequately inflated without focal consolidation or effusion. Stable borderline cardiomegaly. Surgical clips over the mediastinum, right neck and left supraclavicular region. Mild degenerative change of the spine. IMPRESSION: No active cardiopulmonary disease. Electronically Signed   By: Marin Olp M.D.   On: 01/12/2016 20:37   Ct Head Wo Contrast  01/12/2016  CLINICAL DATA:  83 year old male with fall, altered mental status, and weakness. EXAM: CT HEAD WITHOUT CONTRAST TECHNIQUE: Contiguous axial images were obtained from the base of the skull through the vertex without intravenous contrast. COMPARISON:  Head CT dated 04/15/2015 FINDINGS: There is mild age-related atrophy and chronic microvascular ischemic changes appropriate for patient's age.  There is no acute intracranial hemorrhage. No mass effect or midline shift noted. The visualized paranasal sinuses and mastoid air cells are clear. The calvarium is intact. IMPRESSION: No acute intracranial hemorrhage. Age-related atrophy and chronic microvascular ischemic disease. If symptoms persist and there are no contraindications, MRI may provide better evaluation if clinically indicated. Electronically Signed   By: Anner Crete M.D.   On: 01/12/2016 22:29    Microbiology: Recent Results (from the past 240 hour(s))  C difficile quick scan w PCR reflex     Status: None   Collection Time: 01/15/16  1:14 AM  Result Value Ref Range Status   C Diff antigen NEGATIVE NEGATIVE Final  C Diff toxin NEGATIVE NEGATIVE Final   C Diff interpretation Negative for toxigenic C. difficile  Final     Labs: Basic Metabolic Panel:  Recent Labs Lab 01/12/16 1959  01/13/16 0824 01/14/16 0405 01/15/16 0342 01/16/16 0333 01/17/16 0403  NA 139  < > 140 140 142 141 142  K 2.7*  < > 2.8* 3.6 3.5 3.7 3.4*  CL 94*  < > 96* 100* 104 104 102  CO2 35*  --  34* 32 30 29 30   GLUCOSE 126*  < > 110* 112* 115* 111* 89  BUN 55*  < > 53* 53* 45* 46* 44*  CREATININE 2.34*  < > 2.34* 2.07* 1.91* 2.03* 1.91*  CALCIUM 10.0  --  9.4 9.2 9.0 8.7* 8.8*  MG 2.0  --   --   --   --   --   --   < > = values in this interval not displayed. Liver Function Tests:  Recent Labs Lab 01/12/16 1959  AST 22  ALT 14*  ALKPHOS 69  BILITOT 0.9  PROT 6.7  ALBUMIN 4.0   No results for input(s): LIPASE, AMYLASE in the last 168 hours.  Recent Labs Lab 01/12/16 1959  AMMONIA 17   CBC:  Recent Labs Lab 01/12/16 1959 01/12/16 2009 01/13/16 0824 01/14/16 0405 01/15/16 0342 01/17/16 0403  WBC 7.3  --  7.1 8.0 7.6 6.9  NEUTROABS 5.1  --   --   --   --   --   HGB 10.8* 11.2* 9.6* 10.0* 10.0* 9.1*  HCT 31.9* 33.0* 28.9* 30.4* 30.6* 27.3*  MCV 92.2  --  94.8 95.0 96.2 94.5  PLT 136*  --  126* 131* 133* 131*    Cardiac Enzymes: No results for input(s): CKTOTAL, CKMB, CKMBINDEX, TROPONINI in the last 168 hours. BNP: BNP (last 3 results)  Recent Labs  03/31/15 2045 08/24/15 0343 10/31/15 1044  BNP 193.2* 135.7* 133.8*    ProBNP (last 3 results) No results for input(s): PROBNP in the last 8760 hours.  CBG:  Recent Labs Lab 01/12/16 1949 01/14/16 0808 01/17/16 0816  GLUCAP 122* 112* 109*       Signed:  Cristal Ford  [facility] 01/17/2016, 11:29 AM

## 2016-01-17 NOTE — Telephone Encounter (Signed)
Pts wife, Edward Woodward left 2 voice mails yesterday (01/16/16) in [facility] stating her husband was about to be discharged from [facility] to a SNF = Edward Woodward, [address] Edward Woodward (ph# [phone]) for rehab and needed to s/w someone about Zytiga continuing at rehab.  I called Edward Woodward back and she stated her husband has 19 days of Zytiga supply remaining (last filled 01/06/16 per [facility]).  In order for him to continue the Zytiga at [name], he has to basically supply his own drug.  She is wondering if they can refill the RX early if needed for a 3-4 week stay in SNF. Pt and his wife have taken the 19 day supply of Zytiga he currently has available to the SNF.  I contacted [name] [ph# [phone] (Pt's ID# for Edward Woodward patient assistance: [id number])] and s/w representative, Edward Woodward.  He informed me that their policy for patient assistance is that the patient can remain active in their program as long as they are not receiving 24/7 care in either a hospital or SNF.   Once pt is discharged to home from SNF then they will reactivate their account.  We must fax [name] a copy of discharge summaries from the hospital (done today; fax # [phone]) as well as the SNF (pending discharge).  I have placed a copy of the fax cover sheet and accompanying documentation in the [employer] Pt [employer] 2016-17 binder in the oral chemo clinic with pts packet filed alphabetically under [name] name/tab.  Pts wife is aware we will need the discharge summary from Edward Woodward.  Pt is to f/u w/ PCP, Dr. Lajean Manes, within 1 week of discharge. I have set a call back date of 19 days from today to touch base w/ pts wife by phone and determine projected discharge date from Edward Woodward, if available.  Kennith Center, Pharm.D., CPP 01/17/2016@12 :[facility]

## 2016-01-17 NOTE — Progress Notes (Signed)
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 Penicillins Swelling  . Simvastatin Other (See Comments)    Muscle weakness in legs ?    Patient Measurements: Height: _0  (177.8 cm) Weight: 235 lb 0.2 oz (106.6 kg) IBW/kg (Calculated) : 73  Vital Signs: Temp: 98.3 F (36.8 C) (06/23 0641) Temp Source: Oral (06/23 0641) BP: 117/57 mmHg (06/23 0641) Pulse Rate: 76 (06/23 0641)  Labs:  Recent Labs  01/15/16 0342 01/16/16 0333 01/17/16 0403  HGB 10.0*  --  9.1*  HCT 30.6*  --  27.3*  PLT 133*  --  131*  LABPROT 26.4* 24.0* 20.3*  INR 2.47* 2.17* 1.73*  CREATININE 1.91* 2.03* 1.91*    Estimated Creatinine Clearance: 33.9 mL/min (by C-G formula based on Cr of 1.91).    Medications:  Scheduled:  . abiraterone Acetate  1,000 mg Oral Daily  . atorvastatin  40 mg Oral QODAY  . cholecalciferol  4,000 Units Oral Daily  . citalopram  20 mg Oral Daily  . docusate sodium  100 mg Oral BID  . fluticasone  1 spray Each Nare BID  . folic acid  1 mg Oral Daily  . gabapentin  300 mg Oral BID  . isosorbide mononitrate  30 mg Oral Daily  . metoprolol succinate  50 mg Oral Daily  . multivitamin with minerals  1 tablet Oral Daily  . pantoprazole  40 mg Oral BID AC  . polyvinyl alcohol  1 drop Both Eyes QHS  . potassium chloride  40 mEq Oral Daily  . predniSONE  2.5 mg Oral Q breakfast  . torsemide  20 mg Oral BID  . vitamin B-12  1,000 mcg Oral Daily  . Warfarin - Pharmacist Dosing Inpatient   Does not apply q1800   Infusions:     Assessment: 82 yr male brought to ED with lethargy and reports of frequent falls at home this week.  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  PTA warfarin 72m daily except 2.567mMWF - last dose taken at home on 01/12/16, INR on admission 3.37 (supratherapeutic)  Today, 01/17/2016   INR subtherapeutic (=1.73)  but trending down from being supratherapeutic 6/19  CBC: 6/23 - Hgb 9.1, plt stable  No bleeding issues noted  50-75% of meals eaten  Met with palliative care 6/21 and appears patient wishes to discuss risk vs benefit of continuing warfarin  Goal of Therapy:  INR 2-3   Plan:   Warfarin 5 mg PO x 1  Check daily INR   Follow-up patient/oncology decision regarding continuing warfarin therapy, pt had history of falls  AlDarl PikesPharmD Clinical Pharmacist- Resident Pager: [phone] 01/17/2016 8:51 AM

## 2016-01-19 DIAGNOSIS — C8590 Non-Hodgkin lymphoma, unspecified, unspecified site: Secondary | ICD-10-CM | POA: Diagnosis not present

## 2016-01-19 DIAGNOSIS — N1330 Unspecified hydronephrosis: Secondary | ICD-10-CM | POA: Diagnosis not present

## 2016-01-19 DIAGNOSIS — N179 Acute kidney failure, unspecified: Secondary | ICD-10-CM | POA: Diagnosis not present

## 2016-01-19 DIAGNOSIS — G9340 Encephalopathy, unspecified: Secondary | ICD-10-CM | POA: Diagnosis not present

## 2016-01-19 DIAGNOSIS — N183 Chronic kidney disease, stage 3 (moderate): Secondary | ICD-10-CM | POA: Diagnosis not present

## 2016-01-19 DIAGNOSIS — I4891 Unspecified atrial fibrillation: Secondary | ICD-10-CM | POA: Diagnosis not present

## 2016-01-19 DIAGNOSIS — I509 Heart failure, unspecified: Secondary | ICD-10-CM | POA: Diagnosis not present

## 2016-01-19 DIAGNOSIS — G4733 Obstructive sleep apnea (adult) (pediatric): Secondary | ICD-10-CM | POA: Diagnosis not present

## 2016-01-19 DIAGNOSIS — R531 Weakness: Secondary | ICD-10-CM | POA: Diagnosis not present

## 2016-01-19 DIAGNOSIS — I10 Essential (primary) hypertension: Secondary | ICD-10-CM | POA: Diagnosis not present

## 2016-01-19 DIAGNOSIS — R269 Unspecified abnormalities of gait and mobility: Secondary | ICD-10-CM | POA: Diagnosis not present

## 2016-01-20 ENCOUNTER — Other Ambulatory Visit: Payer: Self-pay | Admitting: *Deleted

## 2016-01-20 DIAGNOSIS — Z45018 Encounter for adjustment and management of other part of cardiac pacemaker: Secondary | ICD-10-CM

## 2016-01-20 DIAGNOSIS — K625 Hemorrhage of anus and rectum: Secondary | ICD-10-CM

## 2016-01-20 DIAGNOSIS — Z7901 Long term (current) use of anticoagulants: Secondary | ICD-10-CM

## 2016-01-20 DIAGNOSIS — R627 Adult failure to thrive: Secondary | ICD-10-CM

## 2016-01-20 DIAGNOSIS — I739 Peripheral vascular disease, unspecified: Secondary | ICD-10-CM

## 2016-01-20 DIAGNOSIS — R791 Abnormal coagulation profile: Secondary | ICD-10-CM

## 2016-01-20 DIAGNOSIS — N1330 Unspecified hydronephrosis: Secondary | ICD-10-CM

## 2016-01-20 DIAGNOSIS — C61 Malignant neoplasm of prostate: Secondary | ICD-10-CM

## 2016-01-23 ENCOUNTER — Other Ambulatory Visit: Payer: Self-pay | Admitting: *Deleted

## 2016-01-23 DIAGNOSIS — C61 Malignant neoplasm of prostate: Secondary | ICD-10-CM

## 2016-01-23 MED ORDER — ABIRATERONE ACETATE 250 MG PO TABS: 1000.0000 mg | ORAL_TABLET | Freq: Every day | ORAL | Status: DC

## 2016-01-27 ENCOUNTER — Telehealth: Payer: Self-pay | Admitting: *Deleted

## 2016-01-27 NOTE — Telephone Encounter (Signed)
Received fax from [facility] stating Fabio Asa will be shipped from the foundation to pt's home going forward. Beginning February 25, 2016, all refills to be faxed to [facility]. They are requesting refill to be faxed before 7/14 for August shipment.

## 2016-01-30 ENCOUNTER — Telehealth: Payer: Self-pay | Admitting: *Deleted

## 2016-01-30 NOTE — Telephone Encounter (Signed)
Received fax from [facility] stating Edward Woodward Rx has been denied. Requires prior auth. Spoke with Aaron Edelman, it is unclear whether it was too soon to refill or if his fund has run out. Will need to contact [facility] to clarify.

## 2016-01-31 NOTE — Telephone Encounter (Signed)
Called [facility]. They will need a discharge note from the nursing facility when pt is discharged. Called pt's wife for update. She reports pt is taking his home supply of Zytiga at the nursing home. Pt is due to be discharged from [facility] on 7/12 at which time he will be out of medication.  She reports he is walking unassisted for short distances, making progress.  Wife wants to make Dr. Benay Spice aware pt's Coumadin level is high at the nursing home. They held his dose yesterday. She reports nursing home MD is managing dose. Will make Dr. Benay Spice aware of update. Next appointments confirmed.

## 2016-02-05 ENCOUNTER — Encounter: Payer: Self-pay | Admitting: Pharmacist

## 2016-02-05 ENCOUNTER — Telehealth: Payer: Self-pay | Admitting: *Deleted

## 2016-02-05 NOTE — Progress Notes (Signed)
Discharge Summary from [facility] received from [place], [place] and has been faxed to [name] Pt assistance [facility], as requested, in order to re-activate pts case. A refill request also faxed to [name].  As of Aug 2017, [name] will be mailing pt his Fabio Asa and no longer using pharmacy card.  Will f/u w/ pts wife in 1-2 weeks re: pts status & Rx needs, if any. Kennith Center, Pharm.D., CPP 02/05/2016@11 :31 AM Oral [facility]o [facility]

## 2016-02-05 NOTE — Telephone Encounter (Signed)
Call placed to wife to inform her that discharge summary has been received from [facility] and given to [place] in pharmacy. Patient's wife appreciative of call.

## 2016-02-10 ENCOUNTER — Telehealth: Payer: Self-pay | Admitting: Pharmacist

## 2016-02-10 ENCOUNTER — Telehealth: Payer: Self-pay

## 2016-02-10 MED FILL — ZYTIGA 250 MG TABLET: 250 | 30 days supply | Qty: 120 | Fill #0

## 2016-02-10 NOTE — Telephone Encounter (Signed)
Received a call from [name] - pt took his last dose of Zytiga on Thursday - then ran out. They have not received the refill.  I called [name] [employer] [facility] - [phone]. SNF discharge information and new Zytiga Rx was received on 02/05/16. Patient was reactivated in the program today. He can pick his refill at any pharmacy until Aug 1. After August 1, [facility] will call patient to set up and confirm drug delivery to patient home address.  Spoke with [facility].  They have a Rx on file from June and can fill today. $0 copay. I informed [name] of the above information.  She was thankful for the information and the phone call.  Raul Del, PharmD, BCPS, [facility] [phone]

## 2016-02-10 NOTE — Telephone Encounter (Signed)
Wife called stating pt is out of his potassium and has not had any in 5 days. He has lab appt tomorrow. (cbc, bmet, pt/inr)

## 2016-02-11 ENCOUNTER — Telehealth: Payer: Self-pay | Admitting: *Deleted

## 2016-02-11 ENCOUNTER — Other Ambulatory Visit (HOSPITAL_BASED_OUTPATIENT_CLINIC_OR_DEPARTMENT_OTHER): Payer: Medicare Other

## 2016-02-11 DIAGNOSIS — C61 Malignant neoplasm of prostate: Secondary | ICD-10-CM

## 2016-02-11 DIAGNOSIS — Z7901 Long term (current) use of anticoagulants: Secondary | ICD-10-CM | POA: Diagnosis not present

## 2016-02-11 DIAGNOSIS — Z45018 Encounter for adjustment and management of other part of cardiac pacemaker: Secondary | ICD-10-CM

## 2016-02-11 DIAGNOSIS — Z8572 Personal history of non-Hodgkin lymphomas: Secondary | ICD-10-CM | POA: Diagnosis not present

## 2016-02-11 DIAGNOSIS — Z8546 Personal history of malignant neoplasm of prostate: Secondary | ICD-10-CM | POA: Diagnosis present

## 2016-02-11 DIAGNOSIS — R791 Abnormal coagulation profile: Secondary | ICD-10-CM

## 2016-02-11 DIAGNOSIS — N1330 Unspecified hydronephrosis: Secondary | ICD-10-CM

## 2016-02-11 DIAGNOSIS — I739 Peripheral vascular disease, unspecified: Secondary | ICD-10-CM

## 2016-02-11 DIAGNOSIS — K625 Hemorrhage of anus and rectum: Secondary | ICD-10-CM

## 2016-02-11 DIAGNOSIS — Z86718 Personal history of other venous thrombosis and embolism: Secondary | ICD-10-CM

## 2016-02-11 DIAGNOSIS — R627 Adult failure to thrive: Secondary | ICD-10-CM

## 2016-02-11 LAB — BASIC METABOLIC PANEL
Anion Gap: 12 mEq/L — ABNORMAL HIGH (ref 3–11)
BUN: 39.3 mg/dL — AB (ref 7.0–26.0)
CHLORIDE: 102 meq/L (ref 98–109)
CO2: 29 mEq/L (ref 22–29)
Calcium: 9.9 mg/dL (ref 8.4–10.4)
Creatinine: 2.2 mg/dL — ABNORMAL HIGH (ref 0.7–1.3)
EGFR: 26 mL/min/{1.73_m2} — ABNORMAL LOW (ref >90)
GLUCOSE: 120 mg/dL (ref 70–140)
POTASSIUM: 4 meq/L (ref 3.5–5.1)
Sodium: 142 mEq/L (ref 136–145)

## 2016-02-11 LAB — CBC & DIFF AND RETIC
BASO%: 0.2 % (ref 0.0–2.0)
Basophils Absolute: 0 10*3/uL (ref 0.0–0.1)
EOS%: 3.3 % (ref 0.0–7.0)
Eosinophils Absolute: 0.2 10*3/uL (ref 0.0–0.5)
HCT: 29.5 % — ABNORMAL LOW (ref 38.4–49.9)
HGB: 9.5 g/dL — ABNORMAL LOW (ref 13.0–17.1)
Immature Retic Fract: 6.8 % (ref 3.00–10.60)
LYMPH%: 23.4 % (ref 14.0–49.0)
MCH: 30.2 pg (ref 27.2–33.4)
MCHC: 32.2 g/dL (ref 32.0–36.0)
MCV: 93.7 fL (ref 79.3–98.0)
MONO#: 0.5 10*3/uL (ref 0.1–0.9)
MONO%: 8.6 % (ref 0.0–14.0)
NEUT%: 64.5 % (ref 39.0–75.0)
NEUTROS ABS: 3.4 10*3/uL (ref 1.5–6.5)
Platelets: 109 10*3/uL — ABNORMAL LOW (ref 140–400)
RBC: 3.15 10*6/uL — ABNORMAL LOW (ref 4.20–5.82)
RDW: 14.3 % (ref 11.0–14.6)
RETIC %: 2.02 % — AB (ref 0.80–1.80)
Retic Ct Abs: 63.63 10*3/uL (ref 34.80–93.90)
WBC: 5.2 10*3/uL (ref 4.0–10.3)
lymph#: 1.2 10*3/uL (ref 0.9–3.3)

## 2016-02-11 LAB — PROTIME-INR
INR: 3.1 (ref 2.00–3.50)
Protime: 37.2 Seconds — ABNORMAL HIGH (ref 10.6–13.4)

## 2016-02-11 NOTE — Telephone Encounter (Signed)
-----   Message from Ladell Pier, MD sent at 02/11/2016  2:04 PM EDT ----- Same coumadin, repeat INR  7/27

## 2016-02-11 NOTE — Telephone Encounter (Signed)
Called pt's wife with instructions to continue same dose Coumadin 5 mg daily except on Thursday and Sunday he holds Coumadin. Instructed her to continue the same. Informed her potassium is normal today, she reports he had 2 loose stools today (had not been an issue previously) and pt continues Demadex. Reviewed with Dr. Benay Spice: remain off of potassium for now, will repeat BMET next visit.  Orders read back and entered.

## 2016-02-18 ENCOUNTER — Ambulatory Visit: Payer: Medicare Other | Admitting: Oncology

## 2016-02-18 ENCOUNTER — Other Ambulatory Visit: Payer: Medicare Other

## 2016-02-20 ENCOUNTER — Telehealth: Payer: Self-pay | Admitting: Oncology

## 2016-02-20 ENCOUNTER — Other Ambulatory Visit (HOSPITAL_BASED_OUTPATIENT_CLINIC_OR_DEPARTMENT_OTHER): Payer: Medicare Other

## 2016-02-20 ENCOUNTER — Ambulatory Visit (HOSPITAL_BASED_OUTPATIENT_CLINIC_OR_DEPARTMENT_OTHER): Payer: Medicare Other | Admitting: Oncology

## 2016-02-20 ENCOUNTER — Other Ambulatory Visit: Payer: Self-pay | Admitting: *Deleted

## 2016-02-20 VITALS — BP 121/56 | HR 65 | Temp 97.7°F | Resp 17 | Ht 70.0 in | Wt 231.5 lb

## 2016-02-20 DIAGNOSIS — I2782 Chronic pulmonary embolism: Secondary | ICD-10-CM

## 2016-02-20 DIAGNOSIS — C61 Malignant neoplasm of prostate: Secondary | ICD-10-CM

## 2016-02-20 DIAGNOSIS — D631 Anemia in chronic kidney disease: Secondary | ICD-10-CM

## 2016-02-20 DIAGNOSIS — Z8572 Personal history of non-Hodgkin lymphomas: Secondary | ICD-10-CM

## 2016-02-20 DIAGNOSIS — N189 Chronic kidney disease, unspecified: Secondary | ICD-10-CM

## 2016-02-20 DIAGNOSIS — I4891 Unspecified atrial fibrillation: Secondary | ICD-10-CM

## 2016-02-20 DIAGNOSIS — Z7901 Long term (current) use of anticoagulants: Secondary | ICD-10-CM

## 2016-02-20 LAB — BASIC METABOLIC PANEL
ANION GAP: 11 meq/L (ref 3–11)
BUN: 33.9 mg/dL — AB (ref 7.0–26.0)
CHLORIDE: 102 meq/L (ref 98–109)
CO2: 28 mEq/L (ref 22–29)
CREATININE: 2.4 mg/dL — AB (ref 0.7–1.3)
Calcium: 10.7 mg/dL — ABNORMAL HIGH (ref 8.4–10.4)
EGFR: 24 mL/min/{1.73_m2} — ABNORMAL LOW (ref >90)
Glucose: 109 mg/dl (ref 70–140)
Potassium: 4.1 mEq/L (ref 3.5–5.1)
SODIUM: 141 meq/L (ref 136–145)

## 2016-02-20 LAB — PROTIME-INR
INR: 3.6 — AB (ref 2.00–3.50)
PROTIME: 43.2 s — AB (ref 10.6–13.4)

## 2016-02-20 NOTE — Progress Notes (Signed)
Edward Woodward OFFICE PROGRESS NOTE   Diagnosis: Prostate cancer, non-Hodgkin's lymphoma  INTERVAL HISTORY:   Edward Woodward was admitted last month with an altered mental status. He was discharged to a skilled nursing facility on 01/17/2016. He completed physical therapy there. While he was in the hospital he was noted to have progressive renal failure. A CT of the abdomen revealed enlargement of a previously known retroperitoneal mass with hydronephrosis. He saw Dr. Gaynelle Arabian. He feels the mass is related to fibrosis. He did not recommend stent placement.  He was discharged from the nursing facility approximate 2 weeks ago. He has care in the home from his wife and daughter. He is able to ambulate short distances with assistance. He would like to begin an outpatient physical therapy program. Good appetite.  Objective:  Vital signs in last 24 hours:  Blood pressure (!) 121/56, pulse 65, temperature 97.7 F (36.5 C), temperature source Oral, resp. rate 17, height 5\' 10"  (1.778 m), weight 231 lb 8 oz (105 kg), SpO2 93 %.    HEENT: Cutaneous masslike fullness overlying the left posterior trapezius Lymphatics: No cervical, supraclavicular, or inguinal nodes Resp: Scattered inspiratory rhonchi at the posterior chest bilaterally, no respiratory distress Cardio: Regular rate and rhythm GI: No hepatosplenomegaly, no mass, nontender Vascular: Trace edema at the left lower leg Neuro: Alert and oriented, he was able to ambulate to the examination table with assistance  Skin: Ecchymoses over the forearms     Lab Results:  Lab Results  Component Value Date   WBC 5.2 02/11/2016   HGB 9.5 (L) 02/11/2016   HCT 29.5 (L) 02/11/2016   MCV 93.7 02/11/2016   PLT 109 (L) 02/11/2016   NEUTROABS 3.4 02/11/2016   02/20/2016: Potassium 4.1, BUN 33.9, creatinine 2.4, calcium 10.7 PT/INR-3.6 Medications: I have reviewed the patient's current  medications.  Assessment/Plan: 1. Non-Hodgkin's lymphoma (follicular grade 3 lymphoma) - status post 6 cycles of Cytoxan/prednisone/rituximab 07/01/2007 through 10/21/2007. He remains in clinical remission. 2. CT chest 04/01/2015 with no lymphadenopathy  CT abdomen/pelvis 05/01/2015 with mild progression of abdomen/pelvic lymphadenopathy 3. Prostate cancer - he has been treated with hormonal therapy, the PSA was stable on 10/15/2014  PSA increased 04/26/2015   Bone scan 05/09/2015 with unchanged increased activity in the thoracic and lumbar spine felt to most likely be degenerative.   Initiation of Abiraterone and prednisone 05/11/2015. Normalization of the PSA on Abiraterone. 4. History of a normocytic anemia secondary to non-Hodgkin's lymphoma, chemotherapy, and renal insufficiency - progressive secondary to GI bleeding, stable 5. History of Mild thrombocytopenia  6. History of bilateral hydronephrosis. Renal ultrasound 09/26/2014 consistent with medical renal disease. No hydronephrosis. 7. Coronary artery disease - status post coronary artery stent placement. 8. History of noncalcified lung nodules. 9. History of severe neutropenia July 2009 - likely related to delayed toxicity from rituximab. 10. Macular degeneration. 11. Right middle lobe density on a CT of the chest 06/18/2010 measuring 2.5 x 1.6 cm with mildly increased metabolic activity (SUV max 2.5) on a PET scan 06/27/2010. The area of increased density was less confluent and appeared smaller on a restaging CT 09/08/2010. Right middle lobe "scarring" with no new or suspicious pulmonary nodule on a CT 08/04/2011. 12. Mild increased metabolic activity associated with several lymph nodes on the PET scan 06/27/2010 - likely related to lymphoma. No lymphadenopathy in the mediastinum or axillary areas on the CT 08/04/2011. 13. Right hip discomfort-he received a steroid injection by his primary physician without improvement. He saw Dr.  [place] and there was a question of a lytic process in the right pelvis. A bone scan on 09/21/2012 revealed no evidence of metastatic disease. 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 Renal insufficiency-progressive CT 01/12/2016 confirmed progressive hydronephrosis, improved 15. Left leg DVT and pulmonary embolism 04/01/2015-on Coumadin 16. Admission 08/23/2015 with severe anemia and GI bleeding  Upper and lower endoscopy 08/26/2015 without a clear source for bleeding identified 17. Leg edema/anasarca-improved with leg wrapping and diuretics 18. Anemia secondary to chronic disease, renal failure, and possibly lymphoma 19. 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 Elevated calcium-potentially hypercalcemia of malignancy    Disposition:  Edward Woodward has a complex medical history. He has peripheral vascular disease, neuropathy, and gait instability. He has chronic renal failure. He is currently being treated for hormone refractory prostate cancer with zytiga. There is no evidence for progression of the prostate cancer. The retroperitoneal mass noted on the CT last month could be related to progressive lymphoma. This could also explain the elevated calcium. Alternatively the retroperitoneal soft tissue may represent fibrosis.  We discussed the indication for proceeding with further diagnostic evaluation to include a biopsy of the retroperitoneal soft tissue. We decided to hold for now. He will return for an office visit and repeat chemistry panel in 2 weeks. If the calcium is higher we will consider proceeding with a retroperitoneal lymph node biopsy.  Edward Woodward is elderly and has multiple comorbid conditions. He has a borderline performance status to consider systemic treatment of lymphoma.  We adjusted the Coumadin dose today. We will make a referral for  outpatient physical therapy.   [name], MD  02/20/2016  1:03 PM

## 2016-02-20 NOTE — Telephone Encounter (Signed)
Gave pt cal & avs °

## 2016-02-21 ENCOUNTER — Other Ambulatory Visit: Payer: Self-pay | Admitting: *Deleted

## 2016-02-21 DIAGNOSIS — C61 Malignant neoplasm of prostate: Secondary | ICD-10-CM

## 2016-02-25 ENCOUNTER — Ambulatory Visit (INDEPENDENT_AMBULATORY_CARE_PROVIDER_SITE_OTHER): Payer: Medicare Other | Admitting: Physician Assistant

## 2016-02-25 ENCOUNTER — Encounter: Payer: Self-pay | Admitting: Physician Assistant

## 2016-02-25 VITALS — BP 138/50 | HR 67 | Ht 70.0 in | Wt 228.1 lb

## 2016-02-25 DIAGNOSIS — I482 Chronic atrial fibrillation, unspecified: Secondary | ICD-10-CM

## 2016-02-25 DIAGNOSIS — I10 Essential (primary) hypertension: Secondary | ICD-10-CM | POA: Diagnosis not present

## 2016-02-25 DIAGNOSIS — I5032 Chronic diastolic (congestive) heart failure: Secondary | ICD-10-CM

## 2016-02-25 DIAGNOSIS — I25810 Atherosclerosis of coronary artery bypass graft(s) without angina pectoris: Secondary | ICD-10-CM

## 2016-02-25 DIAGNOSIS — N183 Chronic kidney disease, stage 3 (moderate): Secondary | ICD-10-CM

## 2016-02-25 DIAGNOSIS — I2510 Atherosclerotic heart disease of native coronary artery without angina pectoris: Secondary | ICD-10-CM

## 2016-02-25 NOTE — Progress Notes (Signed)
Cardiology Office Note    Date:  02/25/2016   ID:  Edward Woodward, DOB 23-Dec-1926, MRN [id number]  PCP:  Mathews Argyle, MD  Cardiologist: Dr. Daneen Schick  No chief complaint on file.   History of Present Illness:  Edward Woodward is a 82 y.o. male with history of CAD status post prior CABG (1995) s/p subsequent PCI's (BMS to SVG-Diag 2007, BMS to SVG-Diag 2008, DES to LCx 123456 systolic/diastolic heart failure, permanent pacemaker insertion, atrial fibrillationon Coumadin, hypertension, history of pulmonary emboli, chronic venous insufficiency with peripheral edema, and obstructive sleep apnea, non-Hodgkin's lymphoma.  Patient underwent cardiac catheterization in 07/2015 for non-STEMI in setting of worsening anemia.LHC demonstrated complex CAD not amenable to intervention. SVG-OM1/OM2 and SVG-DX were both patent. There was high-grade diffuse RCA stenosis including in-stent restenosis, an occluded PDA with left to right collaterals and high-grade obstruction in OM2 proximal to the SVG insertion. There were no good targets for PCI. Beta blocker and nitrates were added. Due to his blood loss anemia, he could not tolerate dual antiplatelet therapy. He was discharged on aspirin and Eliquis, PPI.  Recommendation is to keep Hgb > 9.5.Marland Kitchen2-D echo January 2017 LV function 60-65% with no wall motion abnormality and grade 1 DD. Mild aortic valve stenosis and mild AI.  Last seen by Dr. Tamala Julian 12/04/15 at which time he was having trouble with lower extremity edema and dyspnea. BNP was stable 134 BUN/creatinine 27 and 1.63 respectively.his torsemide was doubled for 2 doses and he was feeling better on follow-up.most recently Dr. Tamala Julian instructed Dr. Benay Spice to have patient start metolazone 2.5 mg 30-60 minutes prior to his a.m. Demadex for 2 days.  Patient was hospitalized in June with acute encephalopathy as well as acute kidney injuryfelt to be toxic metabolic in the setting of electrolyte  derangements. CT of the head was negative.he also had moderate hydronephrosis with retroperitoneal mass followed by urology.they are thinking this might be coming from the lymphoma but our holding off on biopsy at this point because of his comorbidities. His INR was 3.37 on admission.most recent creatinine 02/20/16 was 2.4 which was up from 2.2.  Patient comes in today accompanied by his wife.he is just very weak and fatigued. He complains of abdominal swelling and he is up two close sizes because of this. His weight is 228 which is only up a pound since Dr. Tamala Julian saw him in May but he is not eating much. His edema in his legs is stable but chronic.      Past Medical History:  Diagnosis Date  . CAD (coronary artery disease)     NYHA Class 1B, CABG 1985  . Collagen vascular disease ([place])   . Degeneration of lumbar or lumbosacral intervertebral disc   . Depressive disorder, not elsewhere classified   . GERD (gastroesophageal reflux disease)   . HTN (hypertension)   . Hypercholesteremia   . Neuromuscular disorder (HCC)    neuropathy  . Neuropathy ([place])   . OSA on CPAP   . PE (pulmonary embolism) 03/2015  . Presence of permanent cardiac pacemaker   . Prostate cancer ([place])   . Sinoatrial node dysfunction (HCC)     Past Surgical History:  Procedure Laterality Date  . CARDIAC CATHETERIZATION N/A 08/27/2015   Procedure: Left Heart Cath and Cors/Grafts Angiography;  Surgeon: Belva Crome, MD;  Location: [facility];  Service: Cardiovascular;  Laterality: N/A;  . COLONOSCOPY N/A 08/26/2015   Procedure: COLONOSCOPY;  Surgeon: Clarene Essex, MD;  Location: [facility] ENDOSCOPY;  Service: Endoscopy;  Laterality: N/A;  . CORONARY ANGIOPLASTY WITH STENT PLACEMENT     s/p bare metal stent implant SVG-diagonal 11/23/05, s/p BM stent implantation SVG diagonal 10/22/06 (feeds LAD), and 05/2010 with DES to left circumflex  . CORONARY ARTERY BYPASS GRAFT    . ENDARTERECTOMY    . ESOPHAGOGASTRODUODENOSCOPY  (EGD) WITH PROPOFOL N/A 08/26/2015   Procedure: ESOPHAGOGASTRODUODENOSCOPY (EGD) WITH PROPOFOL;  Surgeon: Clarene Essex, MD;  Location: [facility] ENDOSCOPY;  Service: Endoscopy;  Laterality: N/A;  . EXPLORATORY LAPAROTOMY    . LUMBAR FUSION    . PACEMAKER INSERTION    . ROTATOR CUFF REPAIR      Current Medications: Outpatient Medications Prior to Visit  Medication Sig Dispense Refill  . abiraterone Acetate (ZYTIGA) 250 MG tablet Take 4 tablets (1,000 mg total) by mouth daily. Take on an empty stomach 1 hour before or 2 hours after a meal 120 tablet 1  . benzocaine (ORAJEL) 10 % mucosal gel Use as directed in the mouth or throat 4 (four) times daily as needed for mouth pain. 5.3 g 0  . Cholecalciferol (VITAMIN D3) 2000 UNITS TABS Take 2 tablets by mouth daily.     . citalopram (CELEXA) 20 MG tablet Take 20 mg by mouth daily.      . Cyanocobalamin (VITAMIN B-12 CR) 1000 MCG TBCR Take 1 tablet by mouth daily.    . fluticasone (FLONASE) 50 MCG/ACT nasal spray Place 1 spray into both nostrils daily. 16 g 0  . folic acid (FOLVITE) 1 MG tablet Take 1 tablet (1 mg total) by mouth daily. 30 tablet 5  . gabapentin (NEURONTIN) 300 MG capsule Take 300 mg by mouth 2 (two) times daily. not to exceed 3 times daily    . isosorbide mononitrate (IMDUR) 30 MG 24 hr tablet Take 1 tablet (30 mg total) by mouth daily. 30 tablet 5  . Melatonin 3 MG TABS Take 3 mg by mouth at bedtime.    . metoprolol succinate (TOPROL-XL) 50 MG 24 hr tablet Take 1 tablet (50 mg total) by mouth daily. Take with or immediately following a meal. 90 tablet 3  . Multiple Vitamin (MULTIVITAMIN) capsule Take 1 capsule by mouth daily.    . nitroGLYCERIN (NITROLINGUAL) 0.4 MG/SPRAY spray Place 1 spray under the tongue every 5 (five) minutes x 3 doses as needed for chest pain. 12 g 3  . pantoprazole (PROTONIX) 40 MG tablet Take 1 tablet (40 mg total) by mouth 2 (two) times daily before a meal. 60 tablet 5  . Polyethyl Glycol-Propyl Glycol (SYSTANE)  0.4-0.3 % SOLN Place 1 drop into both eyes at bedtime.     Marland Kitchen UBIQUINOL PO Take 1 tablet by mouth daily. CoQ10    . warfarin (COUMADIN) 5 MG tablet Take 1 tablet (5 mg total) by mouth daily at 6 PM. Have INR rechecked in 2 days. (Patient taking differently: Take 2.5 mg by mouth daily at 6 PM. Hold 02/20/16 and 02/21/16 and then 2.5 mg daily) 30 tablet 0  . atorvastatin (LIPITOR) 40 MG tablet Take 1 tablet (40 mg total) by mouth every other day. (Patient not taking: Reported on 02/25/2016) 30 tablet 5  . metolazone (ZAROXOLYN) 2.5 MG tablet Take 1 tablet (2.5 mg total) by mouth 2 (two) times a week. TAKE  1 TAB ON   Monday AND  Thursday (Patient not taking: Reported on 02/20/2016) 15 tablet 6  . potassium chloride 20 MEQ/15ML (10%) SOLN Take 7.5 mLs (10 mEq total) by  mouth daily. (Patient not taking: Reported on 02/20/2016) 450 mL 0  . predniSONE (DELTASONE) 5 MG tablet Take 2.5 mg daily (Patient not taking: Reported on 02/25/2016) 30 tablet 0  . torsemide (DEMADEX) 20 MG tablet Take 1 tablet (20 mg total) by mouth 2 (two) times daily. (Patient not taking: Reported on 02/25/2016) 180 tablet 3   No facility-administered medications prior to visit.      Allergies:   Penicillins and Simvastatin   Social History   Social History  . Marital status: Married    Spouse name: N/A  . Number of children: 3  . Years of education: N/A   Occupational History  . Retired Optometrist    Social History Main Topics  . Smoking status: Never Smoker  . Smokeless tobacco: Never Used  . Alcohol use No  . Drug use: No  . Sexual activity: Not Asked   Other Topics Concern  . None   Social History Narrative  . None     Family History:  The patient's   family history includes Heart disease (age of onset: 31) in his father.   ROS:   Please see the history of present illness.    Review of Systems  Constitution: Positive for malaise/fatigue.  Cardiovascular: Positive for dyspnea on exertion.  Neurological:        Wife states patient has some memory problems but other times he is completely normal.   All other systems reviewed and are negative.   PHYSICAL EXAM:   VS:  BP (!) 138/50   Pulse 67   Ht 5\' 10"  (1.778 m)   Wt 228 lb 1.9 oz (103.5 kg)   SpO2 94%   BMI 32.73 kg/m   Physical Exam  [id number], in a wheelchair, in no acute distress  Neck: no JVD, carotid bruits, or masses Cardiac:RRR; with some skipping, 2/6 systolic murmur at left sternal border,no rubs, or gallops  Respiratory:  clear to auscultation bilaterally, normal work of breathing GI: soft, nontender,  Slightly distended, + BS Ext: plus 2 edema bilaterally without cyanosis, clubbing,  Good distal pulses bilaterally MS: no deformity or atrophy  Skin: warm and dry, no rash Neuro:  Alert and Oriented x 3, Strength and sensation are intact Psych: euthymic mood, full affect  Wt Readings from Last 3 Encounters:  02/25/16 228 lb 1.9 oz (103.5 kg)  02/20/16 231 lb 8 oz (105 kg)  01/15/16 235 lb 0.2 oz (106.6 kg)      Studies/Labs Reviewed:   EKG:  EKG is not ordered today.    Recent Labs: 10/31/2015: Brain Natriuretic Peptide 133.8 01/12/2016: ALT 14; Magnesium 2.0 02/11/2016: HGB 9.5; Platelets 109 02/20/2016: BUN 33.9; Creatinine 2.4; Potassium 4.1; Sodium 141   Lipid Panel    Component Value Date/Time   CHOL 139 08/24/2015 0343   TRIG 134 08/24/2015 0343   HDL 32 (L) 08/24/2015 0343   CHOLHDL 4.3 08/24/2015 0343   VLDL 27 08/24/2015 0343   LDLCALC 80 08/24/2015 0343    Additional studies/ records that were reviewed today include:  2-D echo 07/2015 Study Conclusions   - Left ventricle: The cavity size was normal. There was moderate   concentric hypertrophy. Systolic function was normal. The   estimated ejection fraction was in the range of 60% to 65%. Wall   motion was normal; there were no regional wall motion   abnormalities. Doppler parameters are consistent with abnormal   left ventricular relaxation (grade 1  diastolic dysfunction).   There was no  evidence of elevated ventricular filling pressure by   Doppler parameters. - Aortic valve: There was mild stenosis. There was mild   regurgitation. Mean gradient (S): 11 mm Hg. Peak gradient (S): 18   mm Hg. Valve area (VTI): 2.29 cm^2. Valve area (Vmax): 2.09 cm^2.   Valve area (Vmean): 1.78 cm^2. - Ascending aorta: The ascending aorta was mildly dilated. - Mitral valve: Structurally normal valve. - Left atrium: The atrium was mildly dilated. - Right ventricle: Systolic function was normal. - Right atrium: The atrium was normal in size. - Tricuspid valve: There was mild regurgitation. - Pulmonary arteries: Systolic pressure was within the normal   range. - Inferior vena cava: The vessel was normal in size. - Pericardium, extracardiac: There was no pericardial effusion.   Impressions:   - Normal LVEF, no new regional wall motion abnormalities are seen.  Cardiac catheterization 07/2015   Conclusion  1. Ost RPDA lesion, 100% stenosed. 2. Ost RCA to Dist RCA lesion, 70% stenosed. The lesion was previously treated with a stent (unknown type). 3. Ost 1st Mrg to 1st Mrg lesion, 100% stenosed. 4. Ost 2nd Mrg lesion, 95% stenosed. 5. 2nd Mrg lesion, 75% stenosed. 6. Lat 2nd Mrg lesion, 80% stenosed. 7. Ost LAD lesion, 100% stenosed. 8. SVG was injected is normal in caliber. 9. There is moderate disease in the graft. 10. was injected is normal in caliber. 11. There is moderate disease in the graft. 12. Prox Graft to Mid Graft lesion, 40% stenosed. The lesion was previously treated with a stent (unknown type). 13. Prox Graft to Dist Graft lesion, before 1st Mrg, 40% stenosed. The lesion was previously treated with a stent (unknown type). 14. Dist LAD lesion, 70% stenosed.    Angiography is nearly impossible to perform from the femoral approach due to marked iliofemoral tortuosity which required use of 125 cm catheters. The tortuosity prevented  catheter torque.  Widely patent sequential saphenous vein graft to the first and second obtuse marginal. Widely patent saphenous vein graft to the first diagonal.  Severe native vessel disease with high-grade diffuse significant stenoses throughout the native right some of which represents in-stent restenosis.  Distal territory is meager. The PDA is totally occluded and supplied by left-to-right collaterals.  Left main coronary artery was never selectively engaged due to tortuosity as described above and lack of appropriate length catheters.  Native left coronary system has shown chronic total occlusion of the LAD in the past. The saphenous vein graft to the diagonal which supplies the LAD is widely patent.  The native circumflex territory contains high-grade obstruction proximal to the graft insertion site in the second obtuse marginal and thus  threatens a large third obtuse marginal branch. There is antegrade significant disease beyond the graft insertion site in the 2nd obtuse marginal. The native circumflex ostial and proximal segment was stented greater than 7 years ago but could not be selectively engaged (as mentioned above under left main).   RECOMMENDATIONS:    Source of angina could be the native right coronary, or the circumflex territory is setting of significant anemia. These territories by necessity will have to be treated with medically as there are no interventional options.  He is not a candidate for triple therapy (dual antiplatelet + coumadin/NOAC) and perhaps not even double drug therapy (antiplatlet +coumadin/NOAC). I would resume Xarelto or coumadin when safe and hemoglobin is stable to complete PE therapy. If recurrent bleeding, may need to switch to single antiplatelet therapy.  Transfuse to keep Hgb >  9.5 as this will help control angina.         ASSESSMENT:    1. Chronic kidney disease (CKD), stage 3 (moderate)   2. Chronic diastolic heart failure ([place])   3.  Chronic atrial fibrillation ([place])   4. Essential hypertension, benign   5. Atherosclerosis of native coronary artery of native heart without angina pectoris      PLAN:  In order of problems listed above:  CK D stage III last creatinine 2.4. It's becoming more difficult to manage his fluid with his chronic kidney disease. This could also be coming from the retroperitoneal mass and hydronephrosis. Urology did not want to place a stent. Will refer to nephrology for further assistance.  Chronic diastolic heart failure patient's weight is stable and 228. He does have some edema in his legs but overall seems compensated. Continue current dose of torsemide. I've asked his wife to weigh him daily and if he gains 2 or 3 pounds overnight he can take an extra 20 mg of torsemide in the morning. Follow-up with Dr. Tamala Julian.  Chronic atrial fibrillation on Coumadin  Essential hypertension    controlled CAD without angina.    Medication Adjustments/Labs and Tests Ordered: Current medicines are reviewed at length with the patient today.  Concerns regarding medicines are outlined above.  Medication changes, Labs and Tests ordered today are listed in the Patient Instructions below. Patient Instructions  Medication Instructions:  Your physician recommends that you continue on your current medications as directed. Please refer to the Current Medication list given to you today.   Labwork: None   Testing/Procedures: none  Follow-Up: Your physician recommends that you schedule a follow-up appointment in: 4 months with Dr Tamala Julian   Any Other Special Instructions Will Be Listed Below (If Applicable).  You have been referred to Nephrology for evaluation and management of your kidney disease    If you need a refill on your cardiac medications before your next appointment, please call your pharmacy.      Sumner Boast, PA-C  02/25/2016 2:48 PM    [facility] [address], [place],   [address] Phone: [phone]; Fax: [phone]

## 2016-02-25 NOTE — Patient Instructions (Signed)
Medication Instructions:  Your physician recommends that you continue on your current medications as directed. Please refer to the Current Medication list given to you today.   Labwork: None   Testing/Procedures: none  Follow-Up: Your physician recommends that you schedule a follow-up appointment in: 4 months with Dr Tamala Julian   Any Other Special Instructions Will Be Listed Below (If Applicable).  You have been referred to Nephrology for evaluation and management of your kidney disease    If you need a refill on your cardiac medications before your next appointment, please call your pharmacy.

## 2016-02-26 DIAGNOSIS — N183 Chronic kidney disease, stage 3 (moderate): Secondary | ICD-10-CM | POA: Diagnosis not present

## 2016-02-26 DIAGNOSIS — R269 Unspecified abnormalities of gait and mobility: Secondary | ICD-10-CM | POA: Diagnosis not present

## 2016-02-26 DIAGNOSIS — I129 Hypertensive chronic kidney disease with stage 1 through stage 4 chronic kidney disease, or unspecified chronic kidney disease: Secondary | ICD-10-CM | POA: Diagnosis not present

## 2016-02-26 DIAGNOSIS — Z79899 Other long term (current) drug therapy: Secondary | ICD-10-CM | POA: Diagnosis not present

## 2016-03-02 ENCOUNTER — Ambulatory Visit: Payer: Medicare Other | Attending: Geriatric Medicine | Admitting: Physical Therapy

## 2016-03-02 ENCOUNTER — Encounter: Payer: Self-pay | Admitting: Physical Therapy

## 2016-03-02 DIAGNOSIS — M6281 Muscle weakness (generalized): Secondary | ICD-10-CM | POA: Diagnosis not present

## 2016-03-02 DIAGNOSIS — R2689 Other abnormalities of gait and mobility: Secondary | ICD-10-CM | POA: Insufficient documentation

## 2016-03-02 DIAGNOSIS — H353131 Nonexudative age-related macular degeneration, bilateral, early dry stage: Secondary | ICD-10-CM | POA: Diagnosis not present

## 2016-03-02 DIAGNOSIS — R2681 Unsteadiness on feet: Secondary | ICD-10-CM | POA: Diagnosis not present

## 2016-03-02 DIAGNOSIS — R208 Other disturbances of skin sensation: Secondary | ICD-10-CM | POA: Insufficient documentation

## 2016-03-02 NOTE — Therapy (Signed)
[facility] [name] [place], [place], [address] Phone: [phone]   Fax:  [phone]  Physical Therapy Evaluation  Patient Details  Name: Edward Woodward MRN: [id number] Date of Birth: 03-17-1927 Referring Provider: Lajean Manes, MD  Encounter Date: 03/02/2016      PT End of Session - 03/02/16 1301    Visit Number 1   Number of Visits 17  eval + 16 visits   Date for PT Re-Evaluation 05/01/16   Authorization Type Medicare primary; AARP secondary   PT Start Time 1150   PT Stop Time 1240   PT Time Calculation (min) 50 min   Activity Tolerance Patient tolerated treatment well;Patient limited by fatigue  Requires seated rest breaks due to fatigue.   Behavior During Therapy [facility] for tasks assessed/performed      Past Medical History:  Diagnosis Date  . CAD (coronary artery disease)     NYHA Class 1B, CABG 1985  . Collagen vascular disease ([place])   . Degeneration of lumbar or lumbosacral intervertebral disc   . Depressive disorder, not elsewhere classified   . GERD (gastroesophageal reflux disease)   . HTN (hypertension)   . Hypercholesteremia   . Neuromuscular disorder (HCC)    neuropathy  . Neuropathy ([place])   . OSA on CPAP   . PE (pulmonary embolism) 03/2015  . Presence of permanent cardiac pacemaker   . Prostate cancer ([place])   . Sinoatrial node dysfunction (HCC)     Past Surgical History:  Procedure Laterality Date  . CARDIAC CATHETERIZATION N/A 08/27/2015   Procedure: Left Heart Cath and Cors/Grafts Angiography;  Surgeon: Belva Crome, MD;  Location: [facility];  Service: Cardiovascular;  Laterality: N/A;  . COLONOSCOPY N/A 08/26/2015   Procedure: COLONOSCOPY;  Surgeon: Clarene Essex, MD;  Location: [facility] ENDOSCOPY;  Service: Endoscopy;  Laterality: N/A;  . CORONARY ANGIOPLASTY WITH STENT PLACEMENT     s/p bare metal stent implant SVG-diagonal 11/23/05, s/p BM stent implantation SVG diagonal  10/22/06 (feeds LAD), and 05/2010 with DES to left circumflex  . CORONARY ARTERY BYPASS GRAFT    . ENDARTERECTOMY    . ESOPHAGOGASTRODUODENOSCOPY (EGD) WITH PROPOFOL N/A 08/26/2015   Procedure: ESOPHAGOGASTRODUODENOSCOPY (EGD) WITH PROPOFOL;  Surgeon: Clarene Essex, MD;  Location: [facility] ENDOSCOPY;  Service: Endoscopy;  Laterality: N/A;  . EXPLORATORY LAPAROTOMY    . LUMBAR FUSION    . PACEMAKER INSERTION    . ROTATOR CUFF REPAIR      There were no vitals filed for this visit.       Subjective Assessment - 03/02/16 1202    Subjective Pt's wife reports pt has had increasingly more impaired balance since recent hospitalization (6/18 - 01/17/16), then stayed at [facility] for skilled therapy rom 6/23 - 02/05/16. Pt expected to receive HHPT upon DC home; however, HHPT "just never happened". Wife reports pt has had increasingly more confusion since said hospitalization. Prior to hospitalization, pt was using a rollator for mobility and "managing much better", but transitioned from rollator to RW during rehab. Pt fell 3 times during initial day and a half after DC from [facility]. Pt usually sustains falls backwards.    Patient is accompained by: Family member  wife, [name]   Pertinent History Likes to be called "[name]".   Precautions: pacemaker.  PMH significant for: HTN, HLD, A-Fib with pacemaker, OSA on CPAP, NSTEMI s/p CABG, CKD stage III, DVT LLE on chronic coumadin, peripheral neuropathy, anemia, prostate cancer, Non-Hodgkin's Lymphoma (inactive, but suspicion  for reoccurrence at this time,per wife)   Diagnostic tests CT of head (01/12/15): age-related atrophy and chronic microvascular ischemic disease.   Patient Stated Goals for patient to be able to go to the bathroom on his own; to be able to get up off the floor after fall; to stand up from toilet without needing physical assistance from wife   Currently in Pain? No/denies            [facility] PT Assessment - 03/02/16 0001      Assessment    Medical Diagnosis gait disorder   Referring Provider Lajean Manes, MD   Onset Date/Surgical Date 01/12/16     Precautions   Precautions ICD/Pacemaker;Fall   Required Braces or Orthoses --     Restrictions   Weight Bearing Restrictions No     Balance Screen   Has the patient fallen in the past 6 months Yes   How many times? >5   Has the patient had a decrease in activity level because of a fear of falling?  Yes   Is the patient reluctant to leave their home because of a fear of falling?  Yes     [place] Private residence   Living Arrangements Spouse/significant other   Type of [place] One level   Home Equipment Wheelchair - manual;Transport chair;Grab bars - toilet;Walker - 2 wheels;Walker - 4 wheels;Toilet riser     Prior Function   Level of Independence Requires assistive device for independence  mod I using rollator    Vocation Retired     Journalist, newspaper Impaired  recent decline in [employer], per wife     Sensation   Light Touch Impaired Detail   Light Touch Impaired Details Impaired RLE;Impaired LLE   Proprioception Impaired Detail   Proprioception Impaired Details Impaired RLE;Impaired LLE   Additional Comments Pt reports numbness distal to knees in bilat LE's due to peripheral neuropathy     ROM / Strength   AROM / PROM / Strength Strength     Strength   Overall Strength Deficits   Overall Strength Comments hip flexion 4-/5 bilat; knee flexion 4-/5 on L, 4/5 on R, knee extension 4/5 bilat, B ankle DF 4/5, B ankle PF 3-/5.     Transfers   Transfers Sit to Stand;Stand to Sit   Sit to Stand 4: Min assist;5: Supervision   Sit to Stand Details (indicate cue type and reason) Initially requires min A without arm rests available (pt pushing from mat table), and (S), multiple trials from standard chair with BUE use. Provided cueing for setup, technique.   Stand to Sit 5: Supervision    Stand to Sit Details cueing for technique, controlled descent, safety     Ambulation/Gait   Ambulation/Gait Yes   Ambulation/Gait Assistance 5: Supervision   Ambulation Distance (Feet) 120 Feet  X2   Assistive device Rolling walker   Gait Pattern Step-through pattern;Decreased stride length;Decreased hip/knee flexion - right;Decreased hip/knee flexion - left;Decreased dorsiflexion - right;Decreased dorsiflexion - left;Right foot flat;Left foot flat;Right flexed knee in stance;Left flexed knee in stance;Trunk flexed;Wide base of support  minimal B ankle PF during terminal stance   Ambulation Surface Level;Indoor   Gait velocity 1.9 ft/sec  suggests status of household ambulator     Standardized Balance Assessment   Standardized Balance Assessment Timed Up and Go Test     Timed Up and Go Test  TUG Normal TUG   Normal TUG (seconds) 26.15  using RW; > 13.6 sec suggests high fall risk                   [facility] Adult PT Treatment/Exercise - 03/02/16 0001      Bed Mobility   Bed Mobility Supine to Sit;Sit to Supine   Supine to Sit 4: Min assist   Supine to Sit Details (indicate cue type and reason) initially required min A; however, with cueing for logroll technique (for increased efficiency of movement), pt gave effective return demo, requiring only (S)   Sit to Supine 5: Supervision   Sit to Supine - Details (indicate cue type and reason) cueing for logroll technique with effective return demo                PT Education - 03/02/16 1258    Education provided Yes   Education Details PT eval findings, goals, and POC. Recommended wife attend all sessions (due to ST memory impairments) to maximize pt progress. Educated pt/wife on technique for sit <> stand, on logroll technique with supine <> sit to increase pt independence.   Person(s) Educated Patient;Spouse   Methods Explanation;Demonstration;Handout;Verbal cues   Comprehension Verbalized understanding;Returned  demonstration;Need further instruction  May need to review with patient during future sessions          PT Short Term Goals - 03/02/16 1308      PT SHORT TERM GOAL #1   Title Pt will perform HEP with assist of wife to maximize functional gains made in PT.  (Target date: 03/30/16)     PT SHORT TERM GOAL #2   Title Complete Berg and improve score by 5 points from baseline to indicate improved standing balance.  (03/30/16)     PT SHORT TERM GOAL #3   Title Pt will improve TUG time from 26.15 seconds to </= 21 seconds to indicate increased efficiency of mobility. (03/30/16)     PT SHORT TERM GOAL #4   Title Pt will ambulate x200' over level, indoor surfaces with mod I using LRAD to indicate increased safety with household mobility.  (03/30/16)     PT SHORT TERM GOAL #5   Title Pt will consistently perform sit <> stand from standard chair with mod I, no cueing to indicate increased safety with functional transfers. (03/30/16)     Additional Short Term Goals   Additional Short Term Goals Yes     PT SHORT TERM GOAL #6   Title Pt will consistently perform supine <> sit with mod I, no cueing to indicate increased independence with bed mobility. (03/30/16)           PT Long Term Goals - 03/02/16 1312      PT LONG TERM GOAL #1   Title Pt will improve Berg by 10 points from baseline to indicate decreased fall risk.  (Target date: 04/27/16)     PT LONG TERM GOAL #2   Title Pt will improve TUG time from 26.15 seconds to < /= 16 seconds to indicate increased efficiency of mobility. (04/27/16)     PT LONG TERM GOAL #3   Title Pt will improve gait velocity from 1.9 ft/sec to >/= 1.31 ft/sec to indicate pt status of community ambulator.  (109/2/17)     PT LONG TERM GOAL #4   Title Pt will negotiate standard ramp and curb step with supervision using LRAD to indicate increased safety transversing community obstacles. (04/27/16)  PT LONG TERM GOAL #5   Title Pw will ambulate > 500' over unlevel,  paved surfaces with supervision using LRAD to indicate increased activity tolerance, independence with community mobility.  (04/27/16)     Additional Long Term Goals   Additional Long Term Goals Yes     PT LONG TERM GOAL #6   Title Pt will transfer from supine on floor to seated on EOM using standard chair/mat with min A to indicate increased safety with fall recovery.  (04/27/16)               Plan - 03/02/16 1303    Clinical Impression Statement Pt is an 82 y/o M referred to outpatient PT to address gait instability, frequent falls sustained after recent hospitalization. PMH significant for: HTN, HLD, A-Fib with pacemaker, OSA on CPAP, NSTEMI s/p CABG, CKD stage III, DVT LLE on chronic coumadin, peripheral neuropathy, anemia, prostate cancer, Non-Hodgkin's Lymphoma (inactive). PT evaluation reveals the followng: gait impairments, impaired sensation and proprioception in BLE's due to peripheral neuropathy, impaired balance, weakness in bilateral LE's, gait velocity suggestive of patient status of household ambulator, TUG time suggestivve of fall risk; and decreased stability/independence with bed mobility, transfers, ambulation, and negotiation of community obstacles and stairs.  Pt will benefit from skilled outpatient PT 2x/week for 8 weeks to address said impariments.    Rehab Potential Good   Clinical Impairments Affecting Rehab Potential Positive: very supportive wife; negative: complex PMH, ST memory impairments   PT Frequency 2x / week   PT Duration 8 weeks   PT Treatment/Interventions ADLs/Self Care Home Management;DME Instruction;Gait training;Stair training;Functional mobility training;Therapeutic activities;Patient/family education;Neuromuscular re-education;Balance training;Therapeutic exercise;Vestibular   PT Next Visit Plan Complete Merrilee Jansky, initiate HEP (consider [place])   Consulted and Agree with Plan of Care Patient;Family member/caregiver   Family Member Consulted wife, [name]       Patient will benefit from skilled therapeutic intervention in order to improve the following deficits and impairments:  Decreased balance, Decreased mobility, Abnormal gait, Decreased activity tolerance, Decreased strength, Decreased knowledge of use of DME, Postural dysfunction, Increased edema, Impaired sensation, Decreased endurance  Visit Diagnosis: Other abnormalities of gait and mobility - Plan: PT plan of care cert/re-cert  Muscle weakness (generalized) - Plan: PT plan of care cert/re-cert  Unsteadiness on feet - Plan: PT plan of care cert/re-cert  Other disturbances of skin sensation - Plan: PT plan of care cert/re-cert      G-Codes - [id number]    Functional Assessment Tool Used gait velocity = 1.9 ft/sec; TUG = 26.15 with RW; requires min A for sit <> stand with RW   Functional Limitation Mobility: Walking and moving around   Mobility: Walking and Moving Around Current Status [phone]) At least 60 percent but less than 80 percent impaired, limited or restricted   Mobility: Walking and Moving Around Goal Status [phone]) At least 40 percent but less than 60 percent impaired, limited or restricted       Problem List Patient Active Problem List   Diagnosis Date Noted  . Palliative care encounter   . Weakness generalized   . Supratherapeutic INR 01/13/2016  . Encephalopathy acute 01/13/2016  . Falls 01/12/2016  . Failure to thrive in adult 01/12/2016  . Retroperitoneal mass 01/12/2016  . Hydronephrosis 01/12/2016  . AKI (acute kidney injury) ([place]) 01/12/2016  . Weakness 01/12/2016  . Dehydration 12/19/2015  . Long term current use of anticoagulant therapy 12/19/2015  . Hypokalemia 12/19/2015  . Peripheral edema 12/19/2015  . Rectal bleeding  12/19/2015  . Prostate cancer ([place]) 12/19/2015  . Chronic diastolic heart failure ([place]) 08/24/2015  . Pain in the chest   . NSTEMI (non-ST elevated myocardial infarction) ([place])   . Iron deficiency anemia due to chronic  blood loss   . Chest pain 08/23/2015  . Encounter for chemotherapy management 05/06/2015  . Pulmonary embolus ([place]) 04/01/2015  . CKD (chronic kidney disease), stage III 04/01/2015  . Chronic anemia 04/01/2015  . Acute respiratory failure with hypoxia ([place]) 04/01/2015  . Sinus node dysfunction chronotropic incompetence 08/23/2013  . Cardiac pacemaker -[employer] 08/23/2013  . Atrial fibrillation ([place]) 08/23/2013  . Fall 07/11/2013  . Dyspnea 07/10/2013  . OSA on CPAP 07/10/2013  . Peripheral vascular disease ([place]) 06/07/2013  . Atherosclerosis of coronary artery bypass graft of native heart without angina pectoris 06/07/2013  . Hyperlipidemia 06/07/2013  . Essential hypertension, benign 06/07/2013  . Other malignant lymphomas, unspecified site, extranodal and solid organ sites 07/03/2011   Billie Ruddy, PT, DPT [facility] [address] [place], [place], [address] Phone: [phone]   Fax:  [phone] 03/02/16, 2:00 PM  Name: DEWIN COONES MRN: [id number] Date of Birth: 1927-01-29

## 2016-03-03 ENCOUNTER — Other Ambulatory Visit: Payer: Self-pay | Admitting: Oncology

## 2016-03-05 ENCOUNTER — Telehealth: Payer: Self-pay | Admitting: Oncology

## 2016-03-05 ENCOUNTER — Ambulatory Visit (HOSPITAL_BASED_OUTPATIENT_CLINIC_OR_DEPARTMENT_OTHER): Payer: Medicare Other | Admitting: Nurse Practitioner

## 2016-03-05 ENCOUNTER — Other Ambulatory Visit (HOSPITAL_BASED_OUTPATIENT_CLINIC_OR_DEPARTMENT_OTHER): Payer: Medicare Other

## 2016-03-05 ENCOUNTER — Other Ambulatory Visit: Payer: Medicare Other

## 2016-03-05 VITALS — BP 125/64 | HR 65 | Temp 98.0°F | Resp 17 | Ht 70.0 in | Wt 226.6 lb

## 2016-03-05 DIAGNOSIS — D638 Anemia in other chronic diseases classified elsewhere: Secondary | ICD-10-CM

## 2016-03-05 DIAGNOSIS — Z7901 Long term (current) use of anticoagulants: Secondary | ICD-10-CM | POA: Diagnosis not present

## 2016-03-05 DIAGNOSIS — C61 Malignant neoplasm of prostate: Secondary | ICD-10-CM | POA: Diagnosis not present

## 2016-03-05 DIAGNOSIS — I2699 Other pulmonary embolism without acute cor pulmonale: Secondary | ICD-10-CM

## 2016-03-05 DIAGNOSIS — Z8572 Personal history of non-Hodgkin lymphomas: Secondary | ICD-10-CM

## 2016-03-05 DIAGNOSIS — Z8546 Personal history of malignant neoplasm of prostate: Secondary | ICD-10-CM | POA: Diagnosis not present

## 2016-03-05 DIAGNOSIS — I4891 Unspecified atrial fibrillation: Secondary | ICD-10-CM

## 2016-03-05 DIAGNOSIS — D696 Thrombocytopenia, unspecified: Secondary | ICD-10-CM

## 2016-03-05 DIAGNOSIS — I2782 Chronic pulmonary embolism: Secondary | ICD-10-CM

## 2016-03-05 LAB — PROTIME-INR
INR: 1.3 — ABNORMAL LOW (ref 2.00–3.50)
PROTIME: 15.6 s — AB (ref 10.6–13.4)

## 2016-03-05 LAB — CBC WITH DIFFERENTIAL/PLATELET
BASO%: 0.2 % (ref 0.0–2.0)
BASOS ABS: 0 10*3/uL (ref 0.0–0.1)
EOS ABS: 0.1 10*3/uL (ref 0.0–0.5)
EOS%: 1.9 % (ref 0.0–7.0)
HEMATOCRIT: 30.9 % — AB (ref 38.4–49.9)
HEMOGLOBIN: 10 g/dL — AB (ref 13.0–17.1)
LYMPH#: 1.5 10*3/uL (ref 0.9–3.3)
LYMPH%: 24.8 % (ref 14.0–49.0)
MCH: 29.7 pg (ref 27.2–33.4)
MCHC: 32.4 g/dL (ref 32.0–36.0)
MCV: 91.7 fL (ref 79.3–98.0)
MONO#: 0.6 10*3/uL (ref 0.1–0.9)
MONO%: 9.1 % (ref 0.0–14.0)
NEUT#: 4 10*3/uL (ref 1.5–6.5)
NEUT%: 64 % (ref 39.0–75.0)
Platelets: 116 10*3/uL — ABNORMAL LOW (ref 140–400)
RBC: 3.37 10*6/uL — ABNORMAL LOW (ref 4.20–5.82)
RDW: 14.5 % (ref 11.0–14.6)
WBC: 6.2 10*3/uL (ref 4.0–10.3)

## 2016-03-05 LAB — COMPREHENSIVE METABOLIC PANEL
ALBUMIN: 3.4 g/dL — AB (ref 3.5–5.0)
ALK PHOS: 68 U/L (ref 40–150)
ALT: <9 U/L (ref 0–55)
ANION GAP: 10 meq/L (ref 3–11)
AST: 14 U/L (ref 5–34)
BUN: 34.1 mg/dL — ABNORMAL HIGH (ref 7.0–26.0)
CALCIUM: 10.4 mg/dL (ref 8.4–10.4)
CO2: 28 mEq/L (ref 22–29)
Chloride: 102 mEq/L (ref 98–109)
Creatinine: 2.2 mg/dL — ABNORMAL HIGH (ref 0.7–1.3)
EGFR: 26 mL/min/{1.73_m2} — AB (ref >90)
Glucose: 92 mg/dl (ref 70–140)
POTASSIUM: 3.7 meq/L (ref 3.5–5.1)
Sodium: 140 mEq/L (ref 136–145)
Total Bilirubin: 0.52 mg/dL (ref 0.20–1.20)
Total Protein: 6 g/dL — ABNORMAL LOW (ref 6.4–8.3)

## 2016-03-05 NOTE — Patient Instructions (Signed)
Change coumadin to 5 mg on MWF and 2.5 mg all other days

## 2016-03-05 NOTE — Telephone Encounter (Signed)
per opf to sch pt appt-gave pt copy of avs °

## 2016-03-05 NOTE — Progress Notes (Addendum)
[facility] OFFICE PROGRESS NOTE   Diagnosis:  Prostate cancer, non-Hodgkin's lymphoma  INTERVAL HISTORY:   Edward Woodward returns as scheduled. He is feeling better. His wife has noted significant improvement in his overall condition over the past week.. Occasional confusion. This typically occurs when he first wakes up. He is working with physical therapy twice a week. No spontaneous bleeding. He continues to note easy bruising with minor trauma on the forearms. He has a good appetite.  Objective:  Vital signs in last 24 hours:  Blood pressure 125/64, pulse 65, temperature 98 F (36.7 C), temperature source Oral, resp. rate 17, height 5\' 10"  (1.778 m), weight 226 lb 9.6 oz (102.8 kg), SpO2 98 %.    HEENT: No thrush or ulcers. Lymphatics: No palpable cervical or supra-clavicular lymph nodes. Resp: Lungs clear bilaterally. Cardio: Regular rate and rhythm. GI: Abdomen soft and nontender. No organomegaly. Vascular: Trace bilateral lower leg edema. Neuro: Alert and oriented.  Skin: Ecchymoses over the forearms.    Lab Results:  Lab Results  Component Value Date   WBC 6.2 03/05/2016   HGB 10.0 (L) 03/05/2016   HCT 30.9 (L) 03/05/2016   MCV 91.7 03/05/2016   PLT 116 (L) 03/05/2016   NEUTROABS 4.0 03/05/2016    Imaging:  No results found.  Medications: I have reviewed the patient's current medications.  Assessment/Plan: 1. Non-Hodgkin's lymphoma (follicular grade 3 lymphoma) - status post 6 cycles of Cytoxan/prednisone/rituximab 07/01/2007 through 10/21/2007. He remains in clinical remission. 2. CT chest 04/01/2015 with no lymphadenopathy  CT abdomen/pelvis 05/01/2015 with mild progression of abdomen/pelvic lymphadenopathy 3. Prostate cancer - he has been treated with hormonal therapy, the PSA was stable on 10/15/2014  PSA increased 04/26/2015   Bone scan 05/09/2015 with unchanged increased activity in the thoracic and lumbar spine felt to most likely  be degenerative.   Initiation of Abiraterone and prednisone 05/11/2015. Normalization of the PSA on Abiraterone. 4. History of a normocytic anemia secondary to non-Hodgkin's lymphoma, chemotherapy, and renal insufficiency - progressive secondary to GI bleeding, stable 5. History of Mild thrombocytopenia  6. History of bilateral hydronephrosis. Renal ultrasound 09/26/2014 consistent with medical renal disease. No hydronephrosis. 7. Coronary artery disease - status post coronary artery stent placement. 8. History of noncalcified lung nodules. 9. History of severe neutropenia July 2009 - likely related to delayed toxicity from rituximab. 10. Macular degeneration. 11. Right middle lobe density on a CT of the chest 06/18/2010 measuring 2.5 x 1.6 cm with mildly increased metabolic activity (SUV max 2.5) on a PET scan 06/27/2010. The area of increased density was less confluent and appeared smaller on a restaging CT 09/08/2010. Right middle lobe "scarring" with no new or suspicious pulmonary nodule on a CT 08/04/2011. 12. Mild increased metabolic activity associated with several lymph nodes on the PET scan 06/27/2010 - likely related to lymphoma. No lymphadenopathy in the mediastinum or axillary areas on the CT 08/04/2011. 13. Right hip discomfort-he received a steroid injection by his primary physician without improvement. He saw Dr. Collier Salina and there was a question of a lytic process in the right pelvis. A bone scan on 09/21/2012 revealed no evidence of metastatic disease. 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 Renal insufficiency-progressive CT 01/12/2016 confirmed progressive hydronephrosis, improved 15. Left leg DVT and pulmonary embolism 04/01/2015-on Coumadin 16. Admission 08/23/2015 with severe anemia and GI bleeding  Upper and lower endoscopy 08/26/2015 without a clear source for bleeding identified 17. Leg edema/anasarca-improved with leg wrapping and  diuretics 18. Anemia secondary to chronic disease, renal failure, and possibly lymphoma 19. 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 Elevated calcium-potentially hypercalcemia of malignancy    Disposition: Edward Woodward appears improved. He will continue Abiraterone. We will follow-up on the PSA from today.   Mental status is better. We will follow-up on the calcium level from today.  The PT/INR is subtherapeutic. He will increase Coumadin to 5 mg Monday Wednesday Friday and 2.5 mg all other days. He will return in 2 weeks for a repeat PT/INR.  We scheduled a return visit in 4 weeks. He will contact the office in the interim with any problems.  Patient seen with Dr. Benay Spice.    Edward Woodward ANP/GNP-BC   03/05/2016  11:33 AM  This was a shared visit with Edward Woodward. Edward Woodward has an improved performance status. The calcium is normal today.  [name], M.D.

## 2016-03-06 ENCOUNTER — Encounter: Payer: Self-pay | Admitting: Neurology

## 2016-03-06 ENCOUNTER — Ambulatory Visit (INDEPENDENT_AMBULATORY_CARE_PROVIDER_SITE_OTHER): Payer: Medicare Other | Admitting: Neurology

## 2016-03-06 ENCOUNTER — Other Ambulatory Visit (INDEPENDENT_AMBULATORY_CARE_PROVIDER_SITE_OTHER): Payer: Medicare Other

## 2016-03-06 VITALS — BP 120/64 | HR 65 | Ht 70.0 in | Wt 225.6 lb

## 2016-03-06 DIAGNOSIS — G253 Myoclonus: Secondary | ICD-10-CM

## 2016-03-06 DIAGNOSIS — F0390 Unspecified dementia without behavioral disturbance: Secondary | ICD-10-CM

## 2016-03-06 DIAGNOSIS — R2681 Unsteadiness on feet: Secondary | ICD-10-CM

## 2016-03-06 DIAGNOSIS — R4189 Other symptoms and signs involving cognitive functions and awareness: Secondary | ICD-10-CM

## 2016-03-06 DIAGNOSIS — I25810 Atherosclerosis of coronary artery bypass graft(s) without angina pectoris: Secondary | ICD-10-CM | POA: Diagnosis not present

## 2016-03-06 DIAGNOSIS — R296 Repeated falls: Secondary | ICD-10-CM

## 2016-03-06 LAB — PSA

## 2016-03-06 LAB — FOLATE: Folate: >23.7 ng/mL (ref >5.9)

## 2016-03-06 LAB — TSH: TSH: 0.73 u[IU]/mL (ref 0.35–4.50)

## 2016-03-06 MED ORDER — CLONAZEPAM 0.5 MG PO TABS: ORAL_TABLET | ORAL | 3 refills | Status: DC

## 2016-03-06 NOTE — Progress Notes (Signed)
[facility] Clinic Note - Initial Visit   Date: 03/06/16  Edward Woodward MRN: [id number] DOB: [birth year]   Dear Dr. Felipa Eth:  Thank you for your kind referral of Edward Woodward for consultation of gait instability and myoclonus. Although his history is well known to you, please allow [place] to reiterate it for the purpose of our medical record. The patient was accompanied to the clinic by wife who also provides collateral information.     History of Present Illness: Edward Woodward is a 82 y.o. right-handed Caucasian male with prostate cancer, non-Hodgkin's lymphoma on chemotherapy, depression, CAD status post CABG, atrial fibrillation on Coumadin, sinus node dysfunction s/p PPM, history of DVT and PE, and lumbar surgery for stenosis presenting for evaluation of abnormal movements.    Patient has noticed a steady decline in function over the past year.  He was previously walking with a rollator and able to bath himself, but since early 2016, he has started to need assistance from his wife.  In mid-June, patient condition progressed where he was unable to walk, began falling more, hallucinating, limbs jerking, and felt increasingly weak.  He was admitted to [facility] on 6/18 -6/23 for confusion, arm shaking, and poor PO intake. Wife states that he had a lot of jerking of his arms and trembling of the leg.  Mental status change was secondary to toxic-metabolic in setting of electrolyte derangements (hypokalemia) and acute kidney injury.  CT of the abdomen was remarkable for interval progression of a retroperitoneal/periaortic mass with encasement of the aorta suggestive of retroperitoneal fibrosis, adenopathy, or lymphoma. Also noted on CT abdomen is bilateral hydronephrosis which has worsened since the prior study and is likely secondary to compression of the proximal ureters by the aforementioned mass. He was treated conservatively, repleted with electrotyles, and  given IV hydration.  He eventually showed improvement and was discharged back to SNF, where he slowly noticed slight improvement of the abnormal movements. He has been at home since July 12th. He continues to have jerking movements of the hands, but overall significantly improved from before.  He currently uses a walker and wheelchair.  He was previously using a rollator, but was told at rehab, he was moving too fast with it.  Since returning home, his confusion has improved.  He is able to feed himself.  His wife assists him for showers.  Over the last year, he has become much more forgetful.    He has previously been evaluated at [facility] in 2006 at which time he was diagnosed by EMG with multiple chronic severe radiculopathies with evidence of ongoing denervation in the bilateral gastrocnemius muscles and superimposed mild axonal polyneuropathy. There was concern for hardware slippage from his prior lumbar surgery and he was evaluated by his neurosurgeon, Dr. Ellene Route in [place], and was told he had 2 screws that were loose. He elected not to undergo further surgery at that time. More recently, he was evaluated in 2013 for falls and imbalance and diagnosed with idiopathic neuropathy and due to clinical symptoms suggestive of Parkinson's disease, also offered a trail of sinemet 25/100mg  TID.  Patient and his wife do not recall if he started sinemet or whether the diagnosis of PD was discussed.   Out-side paper records, electronic medical record, and images have been reviewed where available and summarized as:  Lab Results  Component Value Date   TSH 1.47 02/21/2014   Lab Results  Component Value Date   HGBA1C  5.2 08/24/2015   Lab Results  Component Value Date   VITAMINB12 3,473 (H) 08/26/2015   CT head 01/12/2016: No acute intracranial hemorrhage. Age-related atrophy and chronic microvascular ischemic disease.  Past Medical History:  Diagnosis Date  . CAD (coronary  artery disease)     NYHA Class 1B, CABG 1985  . Collagen vascular disease ([place])   . Degeneration of lumbar or lumbosacral intervertebral disc   . Depressive disorder, not elsewhere classified   . GERD (gastroesophageal reflux disease)   . HTN (hypertension)   . Hypercholesteremia   . Neuromuscular disorder (HCC)    neuropathy  . Neuropathy ([place])   . OSA on CPAP   . PE (pulmonary embolism) 03/2015  . Presence of permanent cardiac pacemaker   . Prostate cancer ([place])   . Sinoatrial node dysfunction (HCC)     Past Surgical History:  Procedure Laterality Date  . CARDIAC CATHETERIZATION N/A 08/27/2015   Procedure: Left Heart Cath and Cors/Grafts Angiography;  Surgeon: Belva Crome, MD;  Location: [facility];  Service: Cardiovascular;  Laterality: N/A;  . COLONOSCOPY N/A 08/26/2015   Procedure: COLONOSCOPY;  Surgeon: Clarene Essex, MD;  Location: [facility] ENDOSCOPY;  Service: Endoscopy;  Laterality: N/A;  . CORONARY ANGIOPLASTY WITH STENT PLACEMENT     s/p bare metal stent implant SVG-diagonal 11/23/05, s/p BM stent implantation SVG diagonal 10/22/06 (feeds LAD), and 05/2010 with DES to left circumflex  . CORONARY ARTERY BYPASS GRAFT    . ENDARTERECTOMY    . ESOPHAGOGASTRODUODENOSCOPY (EGD) WITH PROPOFOL N/A 08/26/2015   Procedure: ESOPHAGOGASTRODUODENOSCOPY (EGD) WITH PROPOFOL;  Surgeon: Clarene Essex, MD;  Location: [facility]COPY;  Service: Endoscopy;  Laterality: N/A;  . EXPLORATORY LAPAROTOMY    . LUMBAR FUSION    . PACEMAKER INSERTION    . ROTATOR CUFF REPAIR       Medications:  Outpatient Encounter Prescriptions as of 03/06/2016  Medication Sig  . abiraterone Acetate (ZYTIGA) 250 MG tablet Take 4 tablets (1,000 mg total) by mouth daily. Take on an empty stomach 1 hour before or 2 hours after a meal  . atorvastatin (LIPITOR) 40 MG tablet Take 40 mg by mouth every other day.  . benzocaine (ORAJEL) 10 % mucosal gel Use as directed in the mouth or throat 4 (four) times daily as needed for  mouth pain.  . Cholecalciferol (VITAMIN D3) 2000 UNITS TABS Take 2 tablets by mouth daily.   . citalopram (CELEXA) 20 MG tablet Take 20 mg by mouth daily.    . clonazePAM (KLONOPIN) 0.5 MG tablet Take half tablet at bedtime x 3 days, then increase to half tablet twice daily.  . Cyanocobalamin (VITAMIN B-12 CR) 1000 MCG TBCR Take 1 tablet by mouth daily.  . fluticasone (FLONASE) 50 MCG/ACT nasal spray Place 1 spray into both nostrils daily.  . folic acid (FOLVITE) 1 MG tablet Take 1 tablet (1 mg total) by mouth daily.  . isosorbide mononitrate (IMDUR) 30 MG 24 hr tablet Take 1 tablet (30 mg total) by mouth daily.  . Melatonin 3 MG TABS Take 3 mg by mouth at bedtime.  . metoprolol succinate (TOPROL-XL) 50 MG 24 hr tablet Take 1 tablet (50 mg total) by mouth daily. Take with or immediately following a meal.  . Multiple Vitamin (MULTIVITAMIN) capsule Take 1 capsule by mouth daily.  . nitroGLYCERIN (NITROLINGUAL) 0.4 MG/SPRAY spray Place 1 spray under the tongue every 5 (five) minutes x 3 doses as needed for chest pain.  . pantoprazole (PROTONIX) 40 MG tablet  Take 1 tablet (40 mg total) by mouth 2 (two) times daily before a meal.  . Polyethyl Glycol-Propyl Glycol (SYSTANE) 0.4-0.3 % SOLN Place 1 drop into both eyes at bedtime.   . predniSONE (DELTASONE) 5 MG tablet Take 5 mg by mouth daily.  . predniSONE (DELTASONE) 5 MG tablet TAKE ONE-HALF TABLET BY MOUTH ONCE DAILY  . torsemide (DEMADEX) 20 MG tablet Take 20 mg by mouth 2 (two) times daily.  Marland Kitchen UBIQUINOL PO Take 1 tablet by mouth daily. CoQ10  . warfarin (COUMADIN) 5 MG tablet Take 1 tablet (5 mg total) by mouth daily at 6 PM. Have INR rechecked in 2 days. (Patient taking differently: Take 2.5 mg by mouth daily at 6 PM. Hold 02/20/16 and 02/21/16 and then 2.5 mg daily)  . [DISCONTINUED] gabapentin (NEURONTIN) 300 MG capsule Take 300 mg by mouth 2 (two) times daily. not to exceed 3 times daily   No facility-administered encounter medications on file  as of 03/06/2016.      Allergies:  Allergies  Allergen Reactions  . Penicillins Swelling  . Simvastatin Other (See Comments)    Muscle weakness in legs ?    Family History: Family History  Problem Relation Age of Onset  . Heart disease Father 15    Social History: Social History  Substance Use Topics  . Smoking status: Never Smoker  . Smokeless tobacco: Never Used  . Alcohol use No   Social History   Social History Narrative   Lives with wife in a one story home.  Has 3 children and 2 stepchildren.  Retired Optometrist.  Education: college.    Review of Systems:  CONSTITUTIONAL: No fevers, chills, night sweats, or weight loss.   EYES: No visual changes or eye pain ENT: No hearing changes.  No history of nose bleeds.   RESPIRATORY: No cough, wheezing and shortness of breath.   CARDIOVASCULAR: Negative for chest pain, and palpitations.   GI: Negative for abdominal discomfort, blood in stools or black stools.  No recent change in bowel habits.   GU:  No history of incontinence.   MUSCLOSKELETAL: +history of joint pain +swelling.  No myalgias.   SKIN: Negative for lesions, rash, and itching.   HEMATOLOGY/ONCOLOGY: Negative for prolonged bleeding, bruising easily, and swollen nodes.  No history of cancer.   ENDOCRINE: Negative for cold or heat intolerance, polydipsia or goiter.   PSYCH:  No depression or anxiety symptoms.   NEURO: As Above.   Vital Signs:  BP 120/64   Pulse 65   Ht 5\' 10"  (1.778 m)   Wt 225 lb 9 oz (102.3 kg)   SpO2 92%   BMI 32.36 kg/m    General Medical Exam:   General:  Arrived in transport wheelchair, well appearing, comfortable.    Neurological Exam: MENTAL STATUS including orientation to time, place, person, recent and remote memory, attention span and concentration, language, and fund of knowledge is fair.  Speech is not dysarthric.  CRANIAL NERVES: II:  No visual field defects.  Unremarkable fundi.   III-IV-VI: Pupils equal round and  reactive to light.  Normal conjugate, extra-ocular eye movements in all directions of gaze.  No nystagmus.  No ptosis.   V:  Normal facial sensation.   VII:  Normal facial symmetry and movements.   VIII:  Reduced hearing bilaterally and vestibular function.   IX-X:  Normal palatal movement.   XI:  Normal shoulder shrug and head rotation.   XII:  Normal tongue strength and range of  motion, no deviation or fasciculation.  MOTOR:  Motor strength is 5/5 throughout, except 5-/5 in hip flexors.  Multifocal and generalized myoclonic jerks are present especially with tactile stimuli and action.  No pronator drift.  Mild cogwheel rigidity in the arms    MSRs: Hyporeflexia in the arm and arreflexic in the legs.  SENSORY: Absent vibration at the knees bilaterally, intact vibration and temperature in the hands.     COORDINATION/GAIT: Normal finger-to- nose-finger.  Intact rapid alternating movements bilaterally. Unable to rise from a chair without using arms.  Gait not tested as patient unable to stand up from chair.   IMPRESSION: 1.  Generalized and multifocal myoclonic jerks, possibly associated with neurodegenerative dementia syndrome.  Unfortunately, because patient arrived 20-min late, I was unable to perform cognitive testing but this will be readdressed at his next visit and we also discussed doing a formal neuropsychological testing to better characterize the type of dementia syndrome.  Various dementia syndrome can be associated with myoclonus.  I do not feel these represent movement due to metabolic derangements or seizures.  Sometimes gabapentin can be associated with myoclonic jerks, so I will discontinue this medication.  I will start him on low dose of clonazepam for symptom management as the jerks are interfering with his day-to-day activity, including standing and walking.  Parkinson-plus syndrome is possible.   2.  Multifactorial gait instability due to deconditioning, neuropathy, lumbar  degeneration, and myoclonic jerks.  Falls precautions were stressed at length because he takes coumadin for atrial fibrillation and history of PE/DVT.  He is aware of the potential risks including devastating intracranial bleed.  I urged him to use a wheelchair and walker, only if he feels safe.  He will be started physical therapy.  PLAN/RECOMMENDATIONS:  1.  Check TSH, copper, folate 2.  Stop gabapentin  3.  Start clonazepam half tablet at bedtime x 3 days, then increase to half tablet twice daily 4.  Call with an update in 3 week  Return to clinic in 3 months.   The duration of this appointment visit was 50 minutes of face-to-face time with the patient.  Greater than 50% of this time was spent in counseling, explanation of diagnosis, planning of further management, and coordination of care.   Thank you for allowing me to participate in patient's care.  If I can answer any additional questions, I would be pleased to do so.    Sincerely,    Donika K. Posey Pronto, DO

## 2016-03-06 NOTE — Patient Instructions (Signed)
1.  Check blood work 2.  Stop gabapentin  3.  Start clonazepam half tablet at bedtime x 3 days, then increase to half tablet twice daily 4.  Call with an update in 3 week  Return to clinic 3 months

## 2016-03-07 ENCOUNTER — Encounter: Payer: Self-pay | Admitting: Nurse Practitioner

## 2016-03-09 ENCOUNTER — Ambulatory Visit: Payer: Medicare Other | Admitting: Rehabilitation

## 2016-03-09 NOTE — Progress Notes (Signed)
Note routed

## 2016-03-10 ENCOUNTER — Ambulatory Visit: Payer: Medicare Other | Admitting: Physical Therapy

## 2016-03-10 ENCOUNTER — Encounter: Payer: Self-pay | Admitting: Physical Therapy

## 2016-03-10 ENCOUNTER — Telehealth: Payer: Self-pay | Admitting: Pharmacist

## 2016-03-10 DIAGNOSIS — R208 Other disturbances of skin sensation: Secondary | ICD-10-CM

## 2016-03-10 DIAGNOSIS — R2689 Other abnormalities of gait and mobility: Secondary | ICD-10-CM | POA: Diagnosis not present

## 2016-03-10 DIAGNOSIS — M6281 Muscle weakness (generalized): Secondary | ICD-10-CM | POA: Diagnosis not present

## 2016-03-10 DIAGNOSIS — R2681 Unsteadiness on feet: Secondary | ICD-10-CM

## 2016-03-10 NOTE — Telephone Encounter (Signed)
Oral Chemotherapy Pharmacist Encounter   Received voicemail from Tarus Entin (patient's wife) that patient would be taking last his Zytiga dose of current supply today (8/15). They were wondering when to expect shipment of next month's supply since prescription is being provided through [name] ([employer]) through [employer] order pharmacy.  I called [employer] at [phone] to inquire about shipment.  They stated that Rx had been transferred to [facility] and that the filling pharmacy just needed to speak with patient or contact to arrange shipment of the [place]. [employer] Customer Service number provided as [phone].  I called and relayed this information to Mrs. Calver and provided her with [employer] number listed above. She expressed concern that they had not reached out to her yet and this may mean a break in therapy for the patient.  I apologized for the potential treatment break but assured Mrs. Petrak that they should have the medication in their hand shortly. She thanked me for my time and assistance.  Mrs. Pefley and patient know to call with any further questions or concerns.  Thank you,  [name], PharmD, BCPS [facility]otherapy [facility]

## 2016-03-10 NOTE — Telephone Encounter (Signed)
thanks

## 2016-03-11 ENCOUNTER — Other Ambulatory Visit: Payer: Self-pay | Admitting: Oncology

## 2016-03-11 ENCOUNTER — Encounter: Payer: Self-pay | Admitting: *Deleted

## 2016-03-11 LAB — COPPER, SERUM: Copper: 153 ug/dL (ref 72–166)

## 2016-03-11 NOTE — Therapy (Signed)
[facility] [address] [place], [place], [address] Phone: [phone]   Fax:  [phone]  Physical Therapy Treatment  Patient Details  Name: Edward Woodward MRN: [id number] Date of Birth: 07/29/1926 Referring Provider: Lajean Manes, MD  Encounter Date: 03/10/2016      PT End of Session - 03/11/16 2045    Visit Number 2   Number of Visits 17  eval + 16 visits   Date for PT Re-Evaluation 05/01/16   Authorization Type Medicare primary; AARP secondary   PT Start Time 1105   PT Stop Time 1145   PT Time Calculation (min) 40 min   Equipment Utilized During Treatment Gait belt   Activity Tolerance Patient tolerated treatment well;Patient limited by fatigue  Requires seated rest breaks due to fatigue.   Behavior During Therapy [facility] for tasks assessed/performed      Past Medical History:  Diagnosis Date  . CAD (coronary artery disease)     NYHA Class 1B, CABG 1985  . Collagen vascular disease ([place])   . Degeneration of lumbar or lumbosacral intervertebral disc   . Depressive disorder, not elsewhere classified   . GERD (gastroesophageal reflux disease)   . HTN (hypertension)   . Hypercholesteremia   . Neuromuscular disorder (HCC)    neuropathy  . Neuropathy ([place])   . OSA on CPAP   . PE (pulmonary embolism) 03/2015  . Presence of permanent cardiac pacemaker   . Prostate cancer ([place])   . Sinoatrial node dysfunction (HCC)     Past Surgical History:  Procedure Laterality Date  . CARDIAC CATHETERIZATION N/A 08/27/2015   Procedure: Left Heart Cath and Cors/Grafts Angiography;  Surgeon: Belva Crome, MD;  Location: [facility];  Service: Cardiovascular;  Laterality: N/A;  . COLONOSCOPY N/A 08/26/2015   Procedure: COLONOSCOPY;  Surgeon: Clarene Essex, MD;  Location: [facility] ENDOSCOPY;  Service: Endoscopy;  Laterality: N/A;  . CORONARY ANGIOPLASTY WITH STENT PLACEMENT     s/p bare metal stent implant SVG-diagonal  11/23/05, s/p BM stent implantation SVG diagonal 10/22/06 (feeds LAD), and 05/2010 with DES to left circumflex  . CORONARY ARTERY BYPASS GRAFT    . ENDARTERECTOMY    . ESOPHAGOGASTRODUODENOSCOPY (EGD) WITH PROPOFOL N/A 08/26/2015   Procedure: ESOPHAGOGASTRODUODENOSCOPY (EGD) WITH PROPOFOL;  Surgeon: Clarene Essex, MD;  Location: [facility] ENDOSCOPY;  Service: Endoscopy;  Laterality: N/A;  . EXPLORATORY LAPAROTOMY    . LUMBAR FUSION    . PACEMAKER INSERTION    . ROTATOR CUFF REPAIR      There were no vitals filed for this visit.     03/10/16 1107  Symptoms/Limitations  Subjective No new falls to report. No pain currently. Spouse reports "he hasn't been doing as well this week". Reports he has been more tired, however not sleeping at night as he is up and down several times. Dr Posey Pronto took him off gabapentin and placed him on Klonpin instead. Today is the 5th day on this medication change.  Patient is accompained by: Family member (wife, Edward Woodward)  Pertinent History Likes to be called "Edward Woodward".   Precautions: pacemaker.  PMH significant for: HTN, HLD, A-Fib with pacemaker, OSA on CPAP, NSTEMI s/p CABG, CKD stage III, DVT LLE on chronic coumadin, peripheral neuropathy, anemia, prostate cancer, Non-Hodgkin's Lymphoma (inactive, but suspicion for reoccurrence at this time,per wife)  Diagnostic tests CT of head (01/12/15): age-related atrophy and chronic microvascular ischemic disease.  Patient Stated Goals for patient to be able to go to the bathroom on  his own; to be able to get up off the floor after fall; to stand up from toilet without needing physical assistance from wife  Pain Assessment  Pain Score 0      03/10/16 1112  Standardized Balance Assessment  Standardized Balance Assessment Berg Balance Test  Berg Balance Test  Sit to Stand 3  Standing Unsupported 2 (posterior loss of balance with sitting as result at 1:44:69)  Sitting with Back Unsupported but Feet Supported on Floor or Stool 4  Stand to  Sit 1  Transfers 2  Standing Unsupported with Eyes Closed 2  Standing Ubsupported with Feet Together 1  From Standing, Reach Forward with Outstretched Arm 1  From Standing Position, Pick up Object from Floor 0  From Standing Position, Turn to Look Behind Over each Shoulder 0 (loss of balance when turning from right to left)  Turn 360 Degrees 0  Standing Unsupported, Alternately Place Feet on Step/Stool 0  Standing Unsupported, One Foot in Front 1  Standing on One Leg 0  Total Score 17     03/10/16 1126  OTAGO PROGRAM  Head Movements Sitting;5 reps  Neck Movements Sitting;5 reps  Back Extension Sitting;5 reps  Trunk Movements Sitting;5 reps (pt not safe after blocked practice both sitting and standing)           PT Short Term Goals - 03/02/16 1308      PT SHORT TERM GOAL #1   Title Pt will perform HEP with assist of wife to maximize functional gains made in PT.  (Target date: 03/30/16)     PT SHORT TERM GOAL #2   Title Complete Berg and improve score by 5 points from baseline to indicate improved standing balance.  (03/30/16)     PT SHORT TERM GOAL #3   Title Pt will improve TUG time from 26.15 seconds to </= 21 seconds to indicate increased efficiency of mobility. (03/30/16)     PT SHORT TERM GOAL #4   Title Pt will ambulate x200' over level, indoor surfaces with mod I using LRAD to indicate increased safety with household mobility.  (03/30/16)     PT SHORT TERM GOAL #5   Title Pt will consistently perform sit <> stand from standard chair with mod I, no cueing to indicate increased safety with functional transfers. (03/30/16)     Additional Short Term Goals   Additional Short Term Goals Yes     PT SHORT TERM GOAL #6   Title Pt will consistently perform supine <> sit with mod I, no cueing to indicate increased independence with bed mobility. (03/30/16)           PT Long Term Goals - 03/02/16 1312      PT LONG TERM GOAL #1   Title Pt will improve Berg by 10 points from  baseline to indicate decreased fall risk.  (Target date: 04/27/16)     PT LONG TERM GOAL #2   Title Pt will improve TUG time from 26.15 seconds to < /= 16 seconds to indicate increased efficiency of mobility. (04/27/16)     PT LONG TERM GOAL #3   Title Pt will improve gait velocity from 1.9 ft/sec to >/= 1.31 ft/sec to indicate pt status of community ambulator.  (109/2/17)     PT LONG TERM GOAL #4   Title Pt will negotiate standard ramp and curb step with supervision using LRAD to indicate increased safety transversing community obstacles. (04/27/16)     PT LONG TERM GOAL #5  Title Pw will ambulate > 500' over unlevel, paved surfaces with supervision using LRAD to indicate increased activity tolerance, independence with community mobility.  (04/27/16)     Additional Long Term Goals   Additional Long Term Goals Yes     PT LONG TERM GOAL #6   Title Pt will transfer from supine on floor to seated on EOM using standard chair/mat with min A to indicate increased safety with fall recovery.  (04/27/16)           Plan - 03/11/16 2047    Clinical Impression Statement Today's session focused on performing Berg Balance Test to establish baseline values. Remainder of session focused on intiation of OTAGO for HEP. Pt is making steady progress toward goals   Rehab Potential Good   Clinical Impairments Affecting Rehab Potential Positive: very supportive wife; negative: complex PMH, ST memory impairments   PT Frequency 2x / week   PT Duration 8 weeks   PT Treatment/Interventions ADLs/Self Care Home Management;DME Instruction;Gait training;Stair training;Functional mobility training;Therapeutic activities;Patient/family education;Neuromuscular re-education;Balance training;Therapeutic exercise;Vestibular   PT Next Visit Plan Complete OTAGO program;continue to work on balance, strengthening and gait towards STGs.   Consulted and Agree with Plan of Care Patient;Family member/caregiver   Family Member  Consulted wife, Edward Woodward      Patient will benefit from skilled therapeutic intervention in order to improve the following deficits and impairments:  Decreased balance, Decreased mobility, Abnormal gait, Decreased activity tolerance, Decreased strength, Decreased knowledge of use of DME, Postural dysfunction, Increased edema, Impaired sensation, Decreased endurance  Visit Diagnosis: Other abnormalities of gait and mobility  Muscle weakness (generalized)  Unsteadiness on feet  Other disturbances of skin sensation     Problem List Patient Active Problem List   Diagnosis Date Noted  . 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 Gait instability 03/06/2016  . Palliative care encounter   . Weakness generalized   . Supratherapeutic INR 01/13/2016  . Encephalopathy acute 01/13/2016  . Falls 01/12/2016  . Failure to thrive in adult 01/12/2016  . Retroperitoneal mass 01/12/2016  . Hydronephrosis 01/12/2016  . AKI (acute kidney injury) ([place]) 01/12/2016  . Weakness 01/12/2016  . Dehydration 12/19/2015  . Long term current use of anticoagulant therapy 12/19/2015  . Hypokalemia 12/19/2015  . Peripheral edema 12/19/2015  . Rectal bleeding 12/19/2015  . Prostate cancer ([place]) 12/19/2015  . Chronic diastolic heart failure ([place]) 08/24/2015  . Pain in the chest   . NSTEMI (non-ST elevated myocardial infarction) ([place])   . Iron deficiency anemia due to chronic blood loss   . Chest pain 08/23/2015  . Encounter for chemotherapy management 05/06/2015  . Pulmonary embolus ([place]) 04/01/2015  . CKD (chronic kidney disease), stage III 04/01/2015  . Chronic anemia 04/01/2015  . Acute respiratory failure with hypoxia ([place]) 04/01/2015  . Sinus node dysfunction chronotropic incompetence 08/23/2013  . Cardiac pacemaker -[employer] 08/23/2013  . Atrial fibrillation ([place]) 08/23/2013  . Fall 07/11/2013  . Dyspnea 07/10/2013  . OSA on CPAP 07/10/2013  . Peripheral vascular disease ([place]) 06/07/2013  .  Atherosclerosis of coronary artery bypass graft of native heart without angina pectoris 06/07/2013  . Hyperlipidemia 06/07/2013  . Essential hypertension, benign 06/07/2013  . Other malignant lymphomas, unspecified site, extranodal and solid organ sites 07/03/2011    Willow Ora, PTA, [facility] [address], [place], [place] [address] [phone] 03/11/16, 8:50 PM   Name: Edward Woodward MRN: [id number] Date of Birth: [birth year]

## 2016-03-12 ENCOUNTER — Encounter: Payer: Self-pay | Admitting: Physical Therapy

## 2016-03-12 ENCOUNTER — Ambulatory Visit: Payer: Medicare Other | Admitting: Physical Therapy

## 2016-03-12 DIAGNOSIS — R208 Other disturbances of skin sensation: Secondary | ICD-10-CM | POA: Diagnosis not present

## 2016-03-12 DIAGNOSIS — M6281 Muscle weakness (generalized): Secondary | ICD-10-CM

## 2016-03-12 DIAGNOSIS — R2681 Unsteadiness on feet: Secondary | ICD-10-CM | POA: Diagnosis not present

## 2016-03-12 DIAGNOSIS — R2689 Other abnormalities of gait and mobility: Secondary | ICD-10-CM

## 2016-03-12 NOTE — Therapy (Signed)
[facility] [address] [place], [place], [address] Phone: [phone]   Fax:  [phone]  Physical Therapy Treatment  Patient Details  Name: Edward Woodward MRN: [id number] Date of Birth: 10-08-1926 Referring Provider: Lajean Manes, MD  Encounter Date: 03/12/2016      PT End of Session - 03/12/16 1414    Visit Number 3   Number of Visits 17  eval + 16 visits   Date for PT Re-Evaluation 05/01/16   Authorization Type Medicare primary; AARP secondary   PT Start Time 1405   PT Stop Time 1445   PT Time Calculation (min) 40 min   Equipment Utilized During Treatment Gait belt   Activity Tolerance Patient tolerated treatment well;Patient limited by fatigue  Requires seated rest breaks due to fatigue.   Behavior During Therapy [facility] for tasks assessed/performed      Past Medical History:  Diagnosis Date  . CAD (coronary artery disease)     NYHA Class 1B, CABG 1985  . Collagen vascular disease ([place])   . Degeneration of lumbar or lumbosacral intervertebral disc   . Depressive disorder, not elsewhere classified   . GERD (gastroesophageal reflux disease)   . HTN (hypertension)   . Hypercholesteremia   . Neuromuscular disorder (HCC)    neuropathy  . Neuropathy ([place])   . OSA on CPAP   . PE (pulmonary embolism) 03/2015  . Presence of permanent cardiac pacemaker   . Prostate cancer ([place])   . Sinoatrial node dysfunction (HCC)     Past Surgical History:  Procedure Laterality Date  . CARDIAC CATHETERIZATION N/A 08/27/2015   Procedure: Left Heart Cath and Cors/Grafts Angiography;  Surgeon: Belva Crome, MD;  Location: [facility];  Service: Cardiovascular;  Laterality: N/A;  . COLONOSCOPY N/A 08/26/2015   Procedure: COLONOSCOPY;  Surgeon: Clarene Essex, MD;  Location: [facility] ENDOSCOPY;  Service: Endoscopy;  Laterality: N/A;  . CORONARY ANGIOPLASTY WITH STENT PLACEMENT     s/p bare metal stent implant SVG-diagonal  11/23/05, s/p BM stent implantation SVG diagonal 10/22/06 (feeds LAD), and 05/2010 with DES to left circumflex  . CORONARY ARTERY BYPASS GRAFT    . ENDARTERECTOMY    . ESOPHAGOGASTRODUODENOSCOPY (EGD) WITH PROPOFOL N/A 08/26/2015   Procedure: ESOPHAGOGASTRODUODENOSCOPY (EGD) WITH PROPOFOL;  Surgeon: Clarene Essex, MD;  Location: [facility] ENDOSCOPY;  Service: Endoscopy;  Laterality: N/A;  . EXPLORATORY LAPAROTOMY    . LUMBAR FUSION    . PACEMAKER INSERTION    . ROTATOR CUFF REPAIR      There were no vitals filed for this visit.      Subjective Assessment - 03/12/16 1411    Subjective Had a fall yesterday afternoon at home. Pants he put on were too big and fell down to floor, He tried to reach down to pul them up and fell backwards. Hurt his elbow only in fall. Pt reports he crawled over to sofa (right next to where he fell) and spouse assist him up.  Did not hit his head. Accompained by daughter today who was not there. She only could report what spouse had told her as pt was unclear on some information.                         Patient is accompained by: Family member   Pertinent History Likes to be called "[name]".   Precautions: pacemaker.  PMH significant for: HTN, HLD, A-Fib with pacemaker, OSA on CPAP, NSTEMI s/p CABG,  CKD stage III, DVT LLE on chronic coumadin, peripheral neuropathy, anemia, prostate cancer, Non-Hodgkin's Lymphoma (inactive, but suspicion for reoccurrence at this time,per wife)   Diagnostic tests CT of head (01/12/15): age-related atrophy and chronic microvascular ischemic disease.   Currently in Pain? No/denies   Pain Score 0-No pain             Balance Exercises - 03/12/16 1415      OTAGO PROGRAM   Head Movements Sitting;5 reps   Neck Movements Sitting;5 reps   Back Extension Sitting;5 reps   Ankle Movements Sitting;10 reps  with back supported   Knee Extensor 10 reps  with back supported   Knee Flexor 10 reps  with counter support   Hip ABductor 10 reps  with  counter support   Ankle Plantorflexors 20 reps, support  10 reps only   Ankle Dorsiflexors 20 reps, support  10 reps only   Knee Bends 10 reps, support             PT Short Term Goals - 03/02/16 1308      PT SHORT TERM GOAL #1   Title Pt will perform HEP with assist of wife to maximize functional gains made in PT.  (Target date: 03/30/16)     PT SHORT TERM GOAL #2   Title Complete Berg and improve score by 5 points from baseline to indicate improved standing balance.  (03/30/16)     PT SHORT TERM GOAL #3   Title Pt will improve TUG time from 26.15 seconds to </= 21 seconds to indicate increased efficiency of mobility. (03/30/16)     PT SHORT TERM GOAL #4   Title Pt will ambulate x200' over level, indoor surfaces with mod I using LRAD to indicate increased safety with household mobility.  (03/30/16)     PT SHORT TERM GOAL #5   Title Pt will consistently perform sit <> stand from standard chair with mod I, no cueing to indicate increased safety with functional transfers. (03/30/16)     Additional Short Term Goals   Additional Short Term Goals Yes     PT SHORT TERM GOAL #6   Title Pt will consistently perform supine <> sit with mod I, no cueing to indicate increased independence with bed mobility. (03/30/16)           PT Long Term Goals - 03/02/16 1312      PT LONG TERM GOAL #1   Title Pt will improve Berg by 10 points from baseline to indicate decreased fall risk.  (Target date: 04/27/16)     PT LONG TERM GOAL #2   Title Pt will improve TUG time from 26.15 seconds to < /= 16 seconds to indicate increased efficiency of mobility. (04/27/16)     PT LONG TERM GOAL #3   Title Pt will improve gait velocity from 1.9 ft/sec to >/= 1.31 ft/sec to indicate pt status of community ambulator.  (109/2/17)     PT LONG TERM GOAL #4   Title Pt will negotiate standard ramp and curb step with supervision using LRAD to indicate increased safety transversing community obstacles. (04/27/16)     PT  LONG TERM GOAL #5   Title Pw will ambulate > 500' over unlevel, paved surfaces with supervision using LRAD to indicate increased activity tolerance, independence with community mobility.  (04/27/16)     Additional Long Term Goals   Additional Long Term Goals Yes     PT LONG TERM GOAL #6   Title Pt  will transfer from supine on floor to seated on EOM using standard chair/mat with min A to indicate increased safety with fall recovery.  (04/27/16)            Plan - 03/12/16 1415    Clinical Impression Statement today's session continued to work on [employer] for HEP. Pt able to demo one's issued at previous session with pictures and minimal cues. Added new ones today with no issues other that fatigue reported. Reinforced with pt not to try and get items off floor due to high risk of falling. Will plan to issue fall prevention strategies next session that pt and his primary caregiver, spouse, attend. Pt is making steady progress toward goals.                           Rehab Potential Good   Clinical Impairments Affecting Rehab Potential Positive: very supportive wife; negative: complex PMH, ST memory impairments   PT Frequency 2x / week   PT Duration 8 weeks   PT Treatment/Interventions ADLs/Self Care Home Management;DME Instruction;Gait training;Stair training;Functional mobility training;Therapeutic activities;Patient/family education;Neuromuscular re-education;Balance training;Therapeutic exercise;Vestibular   PT Next Visit Plan Complete OTAGO program;continue to work on balance, strengthening and gait towards STGs.   Consulted and Agree with Plan of Care Patient;Family member/caregiver   Family Member Consulted wife, [name]      Patient will benefit from skilled therapeutic intervention in order to improve the following deficits and impairments:  Decreased balance, Decreased mobility, Abnormal gait, Decreased activity tolerance, Decreased strength, Decreased knowledge of use of DME,  Postural dysfunction, Increased edema, Impaired sensation, Decreased endurance  Visit Diagnosis: Other abnormalities of gait and mobility  Muscle weakness (generalized)  Unsteadiness on feet  Other disturbances of skin sensation     Problem List Patient Active Problem List   Diagnosis Date Noted  . 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 Gait instability 03/06/2016  . Palliative care encounter   . Weakness generalized   . Supratherapeutic INR 01/13/2016  . Encephalopathy acute 01/13/2016  . Falls 01/12/2016  . Failure to thrive in adult 01/12/2016  . Retroperitoneal mass 01/12/2016  . Hydronephrosis 01/12/2016  . AKI (acute kidney injury) ([place]) 01/12/2016  . Weakness 01/12/2016  . Dehydration 12/19/2015  . Long term current use of anticoagulant therapy 12/19/2015  . Hypokalemia 12/19/2015  . Peripheral edema 12/19/2015  . Rectal bleeding 12/19/2015  . Prostate cancer ([place]) 12/19/2015  . Chronic diastolic heart failure ([place]) 08/24/2015  . Pain in the chest   . NSTEMI (non-ST elevated myocardial infarction) ([place])   . Iron deficiency anemia due to chronic blood loss   . Chest pain 08/23/2015  . Encounter for chemotherapy management 05/06/2015  . Pulmonary embolus ([place]) 04/01/2015  . CKD (chronic kidney disease), stage III 04/01/2015  . Chronic anemia 04/01/2015  . Acute respiratory failure with hypoxia ([place]) 04/01/2015  . Sinus node dysfunction chronotropic incompetence 08/23/2013  . Cardiac pacemaker -[employer] 08/23/2013  . Atrial fibrillation ([place]) 08/23/2013  . Fall 07/11/2013  . Dyspnea 07/10/2013  . OSA on CPAP 07/10/2013  . Peripheral vascular disease ([place]) 06/07/2013  . Atherosclerosis of coronary artery bypass graft of native heart without angina pectoris 06/07/2013  . Hyperlipidemia 06/07/2013  . Essential hypertension, benign 06/07/2013  . Other malignant lymphomas, unspecified site, extranodal and solid organ sites 07/03/2011    Willow Ora, PTA,  [facility] [address] [place], [place] [address] [phone] 03/12/16, 2:48 PM  Name: POLLUX EYE MRN: [id number] Date of Birth: Aug 14, 1926

## 2016-03-16 ENCOUNTER — Ambulatory Visit: Payer: Medicare Other | Admitting: Physical Therapy

## 2016-03-17 ENCOUNTER — Telehealth: Payer: Self-pay | Admitting: Neurology

## 2016-03-17 ENCOUNTER — Ambulatory Visit: Payer: Medicare Other | Admitting: Physical Therapy

## 2016-03-17 ENCOUNTER — Encounter: Payer: Self-pay | Admitting: Physical Therapy

## 2016-03-17 DIAGNOSIS — R208 Other disturbances of skin sensation: Secondary | ICD-10-CM | POA: Diagnosis not present

## 2016-03-17 DIAGNOSIS — M6281 Muscle weakness (generalized): Secondary | ICD-10-CM | POA: Diagnosis not present

## 2016-03-17 DIAGNOSIS — R2681 Unsteadiness on feet: Secondary | ICD-10-CM | POA: Diagnosis not present

## 2016-03-17 DIAGNOSIS — R2689 Other abnormalities of gait and mobility: Secondary | ICD-10-CM

## 2016-03-17 NOTE — Telephone Encounter (Signed)
PT"s wife [name] called in regards to PT and his medication/Dawn CB# [phone] or [phone]

## 2016-03-17 NOTE — Telephone Encounter (Signed)
Called one number and the mailbox was full.  Called the other one and left message for [name] to call me back.

## 2016-03-17 NOTE — Patient Instructions (Addendum)
Fall Prevention in the Home  Falls can cause injuries and can affect people from all age groups. There are many simple things that you can do to make your home safe and to help prevent falls. WHAT CAN I DO ON THE OUTSIDE OF MY HOME?  Regularly repair the edges of walkways and driveways and fix any cracks.  Remove high doorway thresholds.  Trim any shrubbery on the main path into your home.  Use bright outdoor lighting.  Clear walkways of debris and clutter, including tools and rocks.  Regularly check that handrails are securely fastened and in good repair. Both sides of any steps should have handrails.  Install guardrails along the edges of any raised decks or porches.  Have leaves, snow, and ice cleared regularly.  Use sand or salt on walkways during winter months.  In the garage, clean up any spills right away, including grease or oil spills. WHAT CAN I DO IN THE BATHROOM?  Use night lights.  Install grab bars by the toilet and in the tub and shower. Do not use towel bars as grab bars.  Use non-skid mats or decals on the floor of the tub or shower.  If you need to sit down while you are in the shower, use a plastic, non-slip stool..  Keep the floor dry. Immediately clean up any water that spills on the floor.  Remove soap buildup in the tub or shower on a regular basis.  Attach bath mats securely with double-sided non-slip rug tape.  Remove throw rugs and other tripping hazards from the floor. WHAT CAN I DO IN THE BEDROOM?  Use night lights.  Make sure that a bedside light is easy to reach.  Do not use oversized bedding that drapes onto the floor.  Have a firm chair that has side arms to use for getting dressed.  Remove throw rugs and other tripping hazards from the floor. WHAT CAN I DO IN THE KITCHEN?   Clean up any spills right away.  Avoid walking on wet floors.  Place frequently used items in easy-to-reach places.  If you need to reach for something  above you, use a sturdy step stool that has a grab bar.  Keep electrical cables out of the way.  Do not use floor polish or wax that makes floors slippery. If you have to use wax, make sure that it is non-skid floor wax.  Remove throw rugs and other tripping hazards from the floor. WHAT CAN I DO IN THE STAIRWAYS?  Do not leave any items on the stairs.  Make sure that there are handrails on both sides of the stairs. Fix handrails that are broken or loose. Make sure that handrails are as long as the stairways.  Check any carpeting to make sure that it is firmly attached to the stairs. Fix any carpet that is loose or worn.  Avoid having throw rugs at the top or bottom of stairways, or secure the rugs with carpet tape to prevent them from moving.  Make sure that you have a light switch at the top of the stairs and the bottom of the stairs. If you do not have them, have them installed. WHAT ARE SOME OTHER FALL PREVENTION TIPS?  Wear closed-toe shoes that fit well and support your feet. Wear shoes that have rubber soles or low heels.  When you use a stepladder, make sure that it is completely opened and that the sides are firmly locked. Have someone hold the ladder while you   are using it. Do not climb a closed stepladder.  Add color or contrast paint or tape to grab bars and handrails in your home. Place contrasting color strips on the first and last steps.  Use mobility aids as needed, such as canes, walkers, scooters, and crutches.  Turn on lights if it is dark. Replace any light bulbs that burn out.  Set up furniture so that there are clear paths. Keep the furniture in the same spot.  Fix any uneven floor surfaces.  Choose a carpet design that does not hide the edge of steps of a stairway.  Be aware of any and all pets.  Review your medicines with your healthcare provider. Some medicines can cause dizziness or changes in blood pressure, which increase your risk of falling. Talk  with your health care provider about other ways that you can decrease your risk of falls. This may include working with a physical therapist or trainer to improve your strength, balance, and endurance.   This information is not intended to replace advice given to you by your health care provider. Make sure you discuss any questions you have with your health care provider.   Document Released: 07/03/2002 Document Revised: 11/27/2014 Document Reviewed: 08/17/2014 Elsevier Interactive Patient Education 2016 [employer] Inc.  

## 2016-03-18 NOTE — Telephone Encounter (Signed)
We can try even a smaller dose, since it is working but causing side effects.  Either they can try half-tablet of clonazepam 0.5mg  tab at bedtime, or we can send a new Rx for 0.25mg  tablet and he can try half tablet (0.125mg ) twice a day.

## 2016-03-18 NOTE — Telephone Encounter (Signed)
Left message giving patient's wife instructions and requested for her to call if she wants the 0.125 mg bid.

## 2016-03-18 NOTE — Telephone Encounter (Signed)
I spoke with patient's wife and she said that patient has had 2 falls since he last saw you.  He is sleeping most of the day and wakes up at 4 am.  She thinks it may be the klonopin.  She said that it is helping with the tremors but she would like to know if they can cut it back to just night time dose.

## 2016-03-19 ENCOUNTER — Telehealth: Payer: Self-pay | Admitting: *Deleted

## 2016-03-19 ENCOUNTER — Other Ambulatory Visit: Payer: Self-pay | Admitting: Oncology

## 2016-03-19 ENCOUNTER — Other Ambulatory Visit (HOSPITAL_BASED_OUTPATIENT_CLINIC_OR_DEPARTMENT_OTHER): Payer: Medicare Other

## 2016-03-19 DIAGNOSIS — Z7901 Long term (current) use of anticoagulants: Secondary | ICD-10-CM | POA: Diagnosis present

## 2016-03-19 DIAGNOSIS — I2699 Other pulmonary embolism without acute cor pulmonale: Secondary | ICD-10-CM

## 2016-03-19 LAB — PROTIME-INR
INR: 1.8 — AB (ref 2.00–3.50)
Protime: 21.6 Seconds — ABNORMAL HIGH (ref 10.6–13.4)

## 2016-03-19 NOTE — Therapy (Signed)
[facility] [address] [place], [place], [address] Phone: [phone]   Fax:  [phone]  Physical Therapy Treatment  Patient Details  Name: Edward Woodward MRN: [id number] Date of Birth: Oct 06, 1926 Referring Provider: Lajean Manes, MD  Encounter Date: 03/17/2016   03/17/16 1309  PT Visits / Re-Eval  Visit Number 4  Number of Visits 17 (eval + 16 visits)  Date for PT Re-Evaluation 05/01/16  Authorization  Authorization Type Medicare primary; AARP secondary  PT Time Calculation  PT Start Time 1318  PT Stop Time 1400  PT Time Calculation (min) 42 min  PT - End of Session  Equipment Utilized During Treatment Gait belt  Activity Tolerance Patient tolerated treatment well;Patient limited by fatigue (Requires seated rest breaks due to fatigue.)  Behavior During Therapy [facility] for tasks assessed/performed     Past Medical History:  Diagnosis Date  . CAD (coronary artery disease)     NYHA Class 1B, CABG 1985  . Collagen vascular disease ([place])   . Degeneration of lumbar or lumbosacral intervertebral disc   . Depressive disorder, not elsewhere classified   . GERD (gastroesophageal reflux disease)   . HTN (hypertension)   . Hypercholesteremia   . Neuromuscular disorder (HCC)    neuropathy  . Neuropathy ([place])   . OSA on CPAP   . PE (pulmonary embolism) 03/2015  . Presence of permanent cardiac pacemaker   . Prostate cancer ([place])   . Sinoatrial node dysfunction (HCC)     Past Surgical History:  Procedure Laterality Date  . CARDIAC CATHETERIZATION N/A 08/27/2015   Procedure: Left Heart Cath and Cors/Grafts Angiography;  Surgeon: Edward Crome, MD;  Location: [facility];  Service: Cardiovascular;  Laterality: N/A;  . COLONOSCOPY N/A 08/26/2015   Procedure: COLONOSCOPY;  Surgeon: Edward Essex, MD;  Location: [facility] ENDOSCOPY;  Service: Endoscopy;  Laterality: N/A;  . CORONARY ANGIOPLASTY WITH STENT PLACEMENT     s/p bare metal stent implant SVG-diagonal 11/23/05, s/p BM stent implantation SVG diagonal 10/22/06 (feeds LAD), and 05/2010 with DES to left circumflex  . CORONARY ARTERY BYPASS GRAFT    . ENDARTERECTOMY    . ESOPHAGOGASTRODUODENOSCOPY (EGD) WITH PROPOFOL N/A 08/26/2015   Procedure: ESOPHAGOGASTRODUODENOSCOPY (EGD) WITH PROPOFOL;  Surgeon: Edward Essex, MD;  Location: [facility] ENDOSCOPY;  Service: Endoscopy;  Laterality: N/A;  . EXPLORATORY LAPAROTOMY    . LUMBAR FUSION    . PACEMAKER INSERTION    . ROTATOR CUFF REPAIR      There were no vitals filed for this visit.     03/17/16 1324  Symptoms/Limitations  Subjective Had a fall last night in bedroom, tripped over dog. He has shut the bathroom door, which was only light source in bedroom due to the bedroom door being shut already on the way to bed. Was using walker and dog got under his feet. "down I went". Spouse assisted him                                    Patient is accompained by: Family member (spouse)  Pertinent History Likes to be called "[name]".   Precautions: pacemaker.  PMH significant for: HTN, HLD, A-Fib with pacemaker, OSA on CPAP, NSTEMI s/p CABG, CKD stage III, DVT LLE on chronic coumadin, peripheral neuropathy, anemia, prostate cancer, Non-Hodgkin's Lymphoma (inactive, but suspicion for reoccurrence at this time,per wife)  Diagnostic tests CT of head (01/12/15): age-related atrophy  and chronic microvascular ischemic disease.      03/17/16 1341  Transfers  Transfers Floor to Transfer  Sit to Stand 5: Supervision;With upper extremity assist  Stand to Sit 5: Supervision;With upper extremity assist  Floor to Transfer With upper extremity assist (3 person assist to complete stand)  Transfer Cueing cues on various ways for standing from floor. Pt unable to transition into any position that allowed him to be able to use his UE's to assist with standing. Limited by overall body, mostly LE, tightness.           03/17/16 1317  PT  Education  Education provided Yes  Education Details fall prevention strategies  Person(s) Educated Patient;Spouse  Methods Explanation;Demonstration;Verbal cues;Handout  Comprehension Verbalized understanding;Returned demonstration;Verbal cues required;Need further instruction          PT Short Term Goals - 03/02/16 1308      PT SHORT TERM GOAL #1   Title Pt will perform HEP with assist of wife to maximize functional gains made in PT.  (Target date: 03/30/16)     PT SHORT TERM GOAL #2   Title Complete Berg and improve score by 5 points from baseline to indicate improved standing balance.  (03/30/16)     PT SHORT TERM GOAL #3   Title Pt will improve TUG time from 26.15 seconds to </= 21 seconds to indicate increased efficiency of mobility. (03/30/16)     PT SHORT TERM GOAL #4   Title Pt will ambulate x200' over level, indoor surfaces with mod I using LRAD to indicate increased safety with household mobility.  (03/30/16)     PT SHORT TERM GOAL #5   Title Pt will consistently perform sit <> stand from standard chair with mod I, no cueing to indicate increased safety with functional transfers. (03/30/16)     Additional Short Term Goals   Additional Short Term Goals Yes     PT SHORT TERM GOAL #6   Title Pt will consistently perform supine <> sit with mod I, no cueing to indicate increased independence with bed mobility. (03/30/16)           PT Long Term Goals - 03/02/16 1312      PT LONG TERM GOAL #1   Title Pt will improve Berg by 10 points from baseline to indicate decreased fall risk.  (Target date: 04/27/16)     PT LONG TERM GOAL #2   Title Pt will improve TUG time from 26.15 seconds to < /= 16 seconds to indicate increased efficiency of mobility. (04/27/16)     PT LONG TERM GOAL #3   Title Pt will improve gait velocity from 1.9 ft/sec to >/= 1.31 ft/sec to indicate pt status of community ambulator.  (109/2/17)     PT LONG TERM GOAL #4   Title Pt will negotiate standard ramp and  curb step with supervision using LRAD to indicate increased safety transversing community obstacles. (04/27/16)     PT LONG TERM GOAL #5   Title Pw will ambulate > 500' over unlevel, paved surfaces with supervision using LRAD to indicate increased activity tolerance, independence with community mobility.  (04/27/16)     Additional Long Term Goals   Additional Long Term Goals Yes     PT LONG TERM GOAL #6   Title Pt will transfer from supine on floor to seated on EOM using standard chair/mat with min A to indicate increased safety with fall recovery.  (04/27/16)  Plan - 03/18/16 1309    Clinical Impression Statement Todays session address fall prevention and recovery after falls to floor. Pt unable to stand with UE support today until provided 3 person assistance. Due to unsafe nature of this and the amount of assisance needed recommend spouse not help him herself, that she call for assistance. Provided a list of non emergency numbers for local fire departments. Also due to pt falling mulitiple times recently for reasons that could be prevented with cues on safety to pt, recommed pt have direct supervision at all times for now. Pt and spouse verbalized understanding.                                        Rehab Potential Good   Clinical Impairments Affecting Rehab Potential Positive: very supportive wife; negative: complex PMH, ST memory impairments   PT Frequency 2x / week   PT Duration 8 weeks   PT Treatment/Interventions ADLs/Self Care Home Management;DME Instruction;Gait training;Stair training;Functional mobility training;Therapeutic activities;Patient/family education;Neuromuscular re-education;Balance training;Therapeutic exercise;Vestibular   PT Next Visit Plan Complete OTAGO program;continue to work on balance, strengthening and gait towards STGs.   Consulted and Agree with Plan of Care Patient;Family member/caregiver   Family Member Consulted wife, [name]      Patient  will benefit from skilled therapeutic intervention in order to improve the following deficits and impairments:  Decreased balance, Decreased mobility, Abnormal gait, Decreased activity tolerance, Decreased strength, Decreased knowledge of use of DME, Postural dysfunction, Increased edema, Impaired sensation, Decreased endurance  Visit Diagnosis: Other abnormalities of gait and mobility  Muscle weakness (generalized)  Unsteadiness on feet  Other disturbances of skin sensation     Problem List Patient Active Problem List   Diagnosis Date Noted  . 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 Gait instability 03/06/2016  . Palliative care encounter   . Weakness generalized   . Supratherapeutic INR 01/13/2016  . Encephalopathy acute 01/13/2016  . Falls 01/12/2016  . Failure to thrive in adult 01/12/2016  . Retroperitoneal mass 01/12/2016  . Hydronephrosis 01/12/2016  . AKI (acute kidney injury) ([place]) 01/12/2016  . Weakness 01/12/2016  . Dehydration 12/19/2015  . Long term current use of anticoagulant therapy 12/19/2015  . Hypokalemia 12/19/2015  . Peripheral edema 12/19/2015  . Rectal bleeding 12/19/2015  . Prostate cancer ([place]) 12/19/2015  . Chronic diastolic heart failure ([place]) 08/24/2015  . Pain in the chest   . NSTEMI (non-ST elevated myocardial infarction) ([place])   . Iron deficiency anemia due to chronic blood loss   . Chest pain 08/23/2015  . Encounter for chemotherapy management 05/06/2015  . Pulmonary embolus ([place]) 04/01/2015  . CKD (chronic kidney disease), stage III 04/01/2015  . Chronic anemia 04/01/2015  . Acute respiratory failure with hypoxia ([place]) 04/01/2015  . Sinus node dysfunction chronotropic incompetence 08/23/2013  . Cardiac pacemaker -[employer] 08/23/2013  . Atrial fibrillation ([place]) 08/23/2013  . Fall 07/11/2013  . Dyspnea 07/10/2013  . OSA on CPAP 07/10/2013  . Peripheral vascular disease ([place]) 06/07/2013  . Atherosclerosis of coronary artery bypass graft of  native heart without angina pectoris 06/07/2013  . Hyperlipidemia 06/07/2013  . Essential hypertension, benign 06/07/2013  . Other malignant lymphomas, unspecified site, extranodal and solid organ sites 07/03/2011    Willow Ora, PTA, [facility] [address], [place], [place] [address] [phone] 03/19/16, 1:13 PM   Name: Edward Woodward MRN: [id number]  Date of Birth: April 04, 1927

## 2016-03-19 NOTE — Telephone Encounter (Signed)
Per Dr. Benay Spice, pt.'s wife [name] notified to continue same dose of Coumadin. Coumadin dose reviewed with pt.'s wife and she confirmed that pt is taking Coumadin 5 mg on Monday, Wednesday, Friday and 2.5 mg all other days.  Pt.'s wife has no questions or concerns at this time and is appreciative of call.

## 2016-03-20 ENCOUNTER — Ambulatory Visit: Payer: Medicare Other | Admitting: Physical Therapy

## 2016-03-20 VITALS — BP 149/59 | HR 67

## 2016-03-20 DIAGNOSIS — R208 Other disturbances of skin sensation: Secondary | ICD-10-CM

## 2016-03-20 DIAGNOSIS — M6281 Muscle weakness (generalized): Secondary | ICD-10-CM | POA: Diagnosis not present

## 2016-03-20 DIAGNOSIS — R2681 Unsteadiness on feet: Secondary | ICD-10-CM | POA: Diagnosis not present

## 2016-03-20 DIAGNOSIS — R2689 Other abnormalities of gait and mobility: Secondary | ICD-10-CM

## 2016-03-20 NOTE — Patient Instructions (Signed)
Walking Program:  Begin walking for exercise for 3.5 minutes, 1-2 times/day, 5 days/week.   Progress your walking program by adding 1 minute to your routine each week, as tolerated. Be sure to wear good walking shoes, walk in a safe environment, use your walker, and only progress to your tolerance.

## 2016-03-21 NOTE — Therapy (Signed)
[facility] [address] [name], [place], [address] Phone: [phone]   Fax:  [phone]  Physical Therapy Treatment  Patient Details  Name: Edward Woodward MRN: [id number] Date of Birth: 1927/04/25 Referring Provider: Lajean Manes, MD  Encounter Date: 03/20/2016      PT End of Session - 03/21/16 1238    Visit Number 5   Number of Visits 17  eval + 16 visits   Date for PT Re-Evaluation 05/01/16   Authorization Type Medicare primary; AARP secondary   PT Start Time [id number]   PT Stop Time 1623   PT Time Calculation (min) 49 min   Equipment Utilized During Treatment Gait belt   Activity Tolerance Patient tolerated treatment well;Patient limited by fatigue  Requires seated rest breaks due to fatigue.   Behavior During Therapy [facility] for tasks assessed/performed      Past Medical History:  Diagnosis Date  . CAD (coronary artery disease)     NYHA Class 1B, CABG 1985  . Collagen vascular disease ([place])   . Degeneration of lumbar or lumbosacral intervertebral disc   . Depressive disorder, not elsewhere classified   . GERD (gastroesophageal reflux disease)   . HTN (hypertension)   . Hypercholesteremia   . Neuromuscular disorder (HCC)    neuropathy  . Neuropathy ([place])   . OSA on CPAP   . PE (pulmonary embolism) 03/2015  . Presence of permanent cardiac pacemaker   . Prostate cancer ([place])   . Sinoatrial node dysfunction (HCC)     Past Surgical History:  Procedure Laterality Date  . CARDIAC CATHETERIZATION N/A 08/27/2015   Procedure: Left Heart Cath and Cors/Grafts Angiography;  Surgeon: Belva Crome, MD;  Location: [facility] CV LAB;  Service: Cardiovascular;  Laterality: N/A;  . COLONOSCOPY N/A 08/26/2015   Procedure: COLONOSCOPY;  Surgeon: Clarene Essex, MD;  Location: [facility] ENDOSCOPY;  Service: Endoscopy;  Laterality: N/A;  . CORONARY ANGIOPLASTY WITH STENT PLACEMENT     s/p bare metal stent implant SVG-diagonal  11/23/05, s/p BM stent implantation SVG diagonal 10/22/06 (feeds LAD), and 05/2010 with DES to left circumflex  . CORONARY ARTERY BYPASS GRAFT    . ENDARTERECTOMY    . ESOPHAGOGASTRODUODENOSCOPY (EGD) WITH PROPOFOL N/A 08/26/2015   Procedure: ESOPHAGOGASTRODUODENOSCOPY (EGD) WITH PROPOFOL;  Surgeon: Clarene Essex, MD;  Location: [facility] ENDOSCOPY;  Service: Endoscopy;  Laterality: N/A;  . EXPLORATORY LAPAROTOMY    . LUMBAR FUSION    . PACEMAKER INSERTION    . ROTATOR CUFF REPAIR      Vitals:   03/20/16 1616  BP: (!) 149/59  Pulse: 67        Subjective Assessment - 03/20/16 1539    Subjective No falls since last session. Wife again expressing that falls occur when pt leaves room (e.g. when pullin up pants in bathroom). Pt/wife forgot to bring HEP notebook to therapy today.   Patient is accompained by: Family member   Pertinent History Likes to be called "[name]".   Precautions: pacemaker.  PMH significant for: HTN, HLD, A-Fib with pacemaker, OSA on CPAP, NSTEMI s/p CABG, CKD stage III, DVT LLE on chronic coumadin, peripheral neuropathy, anemia, prostate cancer, Non-Hodgkin's Lymphoma (inactive, but suspicion for reoccurrence at this time,per wife)   Diagnostic tests CT of head (01/12/15): age-related atrophy and chronic microvascular ischemic disease.   Patient Stated Goals for patient to be able to go to the bathroom on his own; to be able to get up off the floor after fall; to stand  up from toilet without needing physical assistance from wife   Currently in Pain? No/denies                         [facility] Adult PT Treatment/Exercise - 03/21/16 0001      Transfers   Transfers Sit to Stand;Stand to Sit   Sit to Stand 4: Min guard;5: Supervision   Sit to Stand Details (indicate cue type and reason) Blocked practice x4 reps from mat table, x2 reps from low bench in clinic lobby with RW. Initially, PT providing cueing for  setup/technique; wife effective provided cueing for all remaining  sit > stand transfers.   Stand to Sit 5: Supervision   Stand to Sit Details with RW; cueing for anterior weight shift, slow descent     Ambulation/Gait   Ambulation/Gait Yes   Ambulation/Gait Assistance 5: Supervision;4: Min guard;4: Min assist   Ambulation/Gait Assistance Details Gait 2 x120' with RW requiring (S) to min A due to significant LOB pt sustained when pt turned quickly, picking up walker. added 4 lb weights x2 to RW to discourage fast turning and to decrease pt likelihood to pick up walker from ground. With weights on walker, pt performed gait 2 x75', negotiating obtacles (2 large hoola hoops) x2 trials to assess safety with turns. When weights on RW, pt demonstrated safe speed of turning and sustained no overt LOB. Following seated rest break, pt performed gait x170' (3.5 minutes consecutively) prior to requesting seated rest break due to fatigue. PT provided cueing for paced breathing, as pt appears to stop breathing frequently.    Ambulation Distance (Feet) 560 Feet  total   Assistive device Rolling walker   Gait Pattern Step-through pattern;Decreased stride length;Decreased hip/knee flexion - right;Decreased hip/knee flexion - left;Decreased dorsiflexion - right;Decreased dorsiflexion - left;Right foot flat;Left foot flat;Right flexed knee in stance;Left flexed knee in stance;Trunk flexed;Wide base of support  minimal B ankle PF during terminal stance   Ambulation Surface Level;Indoor     Self-Care   Self-Care Other Self-Care Comments   Other Self-Care Comments  Strongly recommending wife remain arms-length away from patient at all times due to last 2 falls having occurred due to poor pt judgement. Suggested placing reacher in bag on rolling walker to discourage pt from bending over. Asked wife to consider placing weights on walker to decrease risk of fall due to picking walker up from ground, turning too quickly. Recommended wife have pt sit on bench in lobby while wife drives car  to front to avoid pt ambulating alone on unlevel., paved surfaces.                 PT Education - 03/21/16 1227    Education provided Yes   Education Details Walking program initiated; see Pt Instructions for details. Provided pt/wife with extensive education on fall prevention; see Self Care for details.    Person(s) Educated Patient;Spouse   Methods Explanation;Demonstration;Handout;Verbal cues   Comprehension Verbalized understanding          PT Short Term Goals - 03/02/16 1308      PT SHORT TERM GOAL #1   Title Pt will perform HEP with assist of wife to maximize functional gains made in PT.  (Target date: 03/30/16)     PT SHORT TERM GOAL #2   Title Complete Berg and improve score by 5 points from baseline to indicate improved standing balance.  (03/30/16)     PT SHORT TERM GOAL #3  Title Pt will improve TUG time from 26.15 seconds to </= 21 seconds to indicate increased efficiency of mobility. (03/30/16)     PT SHORT TERM GOAL #4   Title Pt will ambulate x200' over level, indoor surfaces with mod I using LRAD to indicate increased safety with household mobility.  (03/30/16)     PT SHORT TERM GOAL #5   Title Pt will consistently perform sit <> stand from standard chair with mod I, no cueing to indicate increased safety with functional transfers. (03/30/16)     Additional Short Term Goals   Additional Short Term Goals Yes     PT SHORT TERM GOAL #6   Title Pt will consistently perform supine <> sit with mod I, no cueing to indicate increased independence with bed mobility. (03/30/16)           PT Long Term Goals - 03/02/16 1312      PT LONG TERM GOAL #1   Title Pt will improve Berg by 10 points from baseline to indicate decreased fall risk.  (Target date: 04/27/16)     PT LONG TERM GOAL #2   Title Pt will improve TUG time from 26.15 seconds to < /= 16 seconds to indicate increased efficiency of mobility. (04/27/16)     PT LONG TERM GOAL #3   Title Pt will improve gait  velocity from 1.9 ft/sec to >/= 1.31 ft/sec to indicate pt status of community ambulator.  (109/2/17)     PT LONG TERM GOAL #4   Title Pt will negotiate standard ramp and curb step with supervision using LRAD to indicate increased safety transversing community obstacles. (04/27/16)     PT LONG TERM GOAL #5   Title Pw will ambulate > 500' over unlevel, paved surfaces with supervision using LRAD to indicate increased activity tolerance, independence with community mobility.  (04/27/16)     Additional Long Term Goals   Additional Long Term Goals Yes     PT LONG TERM GOAL #6   Title Pt will transfer from supine on floor to seated on EOM using standard chair/mat with min A to indicate increased safety with fall recovery.  (04/27/16)               Plan - 03/21/16 1239    Clinical Impression Statement Session focused on implementing the use of compensatory strategies to prevent continued falls. PT, wife, and patient engaged in lengthy discussion about fall prevention. During gait training, noted pt tendency to pick up RW from ground and turn quickly, which consistently causes LOB. Addition of 4 lb weights x2 was effective in avoiding this during subsequent gait trials. Initiated walking program to address significant impairment in activity tolerance.    Rehab Potential Good   Clinical Impairments Affecting Rehab Potential Positive: very supportive wife; negative: complex PMH, ST memory impairments   PT Frequency 2x / week   PT Duration 8 weeks   PT Treatment/Interventions ADLs/Self Care Home Management;DME Instruction;Gait training;Stair training;Functional mobility training;Therapeutic activities;Patient/family education;Neuromuscular re-education;Balance training;Therapeutic exercise;Vestibular   PT Next Visit Plan Complete OTAGO program. Ask about walking program (compliant?). Continue functional strengthening and standing balance.   Consulted and Agree with Plan of Care Patient;Family  member/caregiver   Family Member Consulted wife, [name]      Patient will benefit from skilled therapeutic intervention in order to improve the following deficits and impairments:  Decreased balance, Decreased mobility, Abnormal gait, Decreased activity tolerance, Decreased strength, Decreased knowledge of use of DME, Postural dysfunction, Increased edema, Impaired  sensation, Decreased endurance  Visit Diagnosis: Other abnormalities of gait and mobility  Muscle weakness (generalized)  Unsteadiness on feet  Other disturbances of skin sensation     Problem List Patient Active Problem List   Diagnosis Date Noted  . 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 Gait instability 03/06/2016  . Palliative care encounter   . Weakness generalized   . Supratherapeutic INR 01/13/2016  . Encephalopathy acute 01/13/2016  . Falls 01/12/2016  . Failure to thrive in adult 01/12/2016  . Retroperitoneal mass 01/12/2016  . Hydronephrosis 01/12/2016  . AKI (acute kidney injury) ([place]) 01/12/2016  . Weakness 01/12/2016  . Dehydration 12/19/2015  . Long term current use of anticoagulant therapy 12/19/2015  . Hypokalemia 12/19/2015  . Peripheral edema 12/19/2015  . Rectal bleeding 12/19/2015  . Prostate cancer ([place]) 12/19/2015  . Chronic diastolic heart failure ([place]) 08/24/2015  . Pain in the chest   . NSTEMI (non-ST elevated myocardial infarction) ([place])   . Iron deficiency anemia due to chronic blood loss   . Chest pain 08/23/2015  . Encounter for chemotherapy management 05/06/2015  . Pulmonary embolus ([place]) 04/01/2015  . CKD (chronic kidney disease), stage III 04/01/2015  . Chronic anemia 04/01/2015  . Acute respiratory failure with hypoxia ([place]) 04/01/2015  . Sinus node dysfunction chronotropic incompetence 08/23/2013  . Cardiac pacemaker -[employer] 08/23/2013  . Atrial fibrillation ([place]) 08/23/2013  . Fall 07/11/2013  . Dyspnea 07/10/2013  . OSA on CPAP 07/10/2013  . Peripheral vascular disease  ([place]) 06/07/2013  . Atherosclerosis of coronary artery bypass graft of native heart without angina pectoris 06/07/2013  . Hyperlipidemia 06/07/2013  . Essential hypertension, benign 06/07/2013  . Other malignant lymphomas, unspecified site, extranodal and solid organ sites 07/03/2011   Billie Ruddy, PT, DPT [facility] [address] [place], [place], [address] Phone: [phone]   Fax:  [phone] 03/21/16, 12:46 PM  Name: Edward Woodward MRN: [id number] Date of Birth: 04/01/1927

## 2016-03-23 ENCOUNTER — Emergency Department (HOSPITAL_BASED_OUTPATIENT_CLINIC_OR_DEPARTMENT_OTHER): Payer: Medicare Other

## 2016-03-23 ENCOUNTER — Encounter (HOSPITAL_BASED_OUTPATIENT_CLINIC_OR_DEPARTMENT_OTHER): Payer: Self-pay | Admitting: *Deleted

## 2016-03-23 ENCOUNTER — Telehealth: Payer: Self-pay | Admitting: Neurology

## 2016-03-23 ENCOUNTER — Other Ambulatory Visit: Payer: Self-pay | Admitting: *Deleted

## 2016-03-23 ENCOUNTER — Ambulatory Visit: Payer: Medicare Other | Admitting: Physical Therapy

## 2016-03-23 ENCOUNTER — Emergency Department (HOSPITAL_BASED_OUTPATIENT_CLINIC_OR_DEPARTMENT_OTHER)
Admission: EM | Admit: 2016-03-23 | Discharge: 2016-03-23 | Disposition: A | Discharge status: Home / Self Care | Payer: Medicare Other | Attending: Emergency Medicine | Admitting: Emergency Medicine

## 2016-03-23 DIAGNOSIS — S8991XA Unspecified injury of right lower leg, initial encounter: Secondary | ICD-10-CM | POA: Diagnosis not present

## 2016-03-23 DIAGNOSIS — S80211A Abrasion, right knee, initial encounter: Secondary | ICD-10-CM | POA: Diagnosis not present

## 2016-03-23 DIAGNOSIS — Y999 Unspecified external cause status: Secondary | ICD-10-CM | POA: Diagnosis not present

## 2016-03-23 DIAGNOSIS — Z8546 Personal history of malignant neoplasm of prostate: Secondary | ICD-10-CM | POA: Diagnosis not present

## 2016-03-23 DIAGNOSIS — W010XXA Fall on same level from slipping, tripping and stumbling without subsequent striking against object, initial encounter: Secondary | ICD-10-CM | POA: Diagnosis not present

## 2016-03-23 DIAGNOSIS — S80212A Abrasion, left knee, initial encounter: Secondary | ICD-10-CM | POA: Diagnosis not present

## 2016-03-23 DIAGNOSIS — N183 Chronic kidney disease, stage 3 (moderate): Secondary | ICD-10-CM | POA: Diagnosis not present

## 2016-03-23 DIAGNOSIS — Y939 Activity, unspecified: Secondary | ICD-10-CM | POA: Insufficient documentation

## 2016-03-23 DIAGNOSIS — M25561 Pain in right knee: Secondary | ICD-10-CM | POA: Diagnosis not present

## 2016-03-23 DIAGNOSIS — R51 Headache: Secondary | ICD-10-CM | POA: Diagnosis not present

## 2016-03-23 DIAGNOSIS — S50311A Abrasion of right elbow, initial encounter: Secondary | ICD-10-CM | POA: Diagnosis not present

## 2016-03-23 DIAGNOSIS — I130 Hypertensive heart and chronic kidney disease with heart failure and stage 1 through stage 4 chronic kidney disease, or unspecified chronic kidney disease: Secondary | ICD-10-CM | POA: Insufficient documentation

## 2016-03-23 DIAGNOSIS — I5032 Chronic diastolic (congestive) heart failure: Secondary | ICD-10-CM | POA: Diagnosis not present

## 2016-03-23 DIAGNOSIS — I2510 Atherosclerotic heart disease of native coronary artery without angina pectoris: Secondary | ICD-10-CM | POA: Diagnosis not present

## 2016-03-23 DIAGNOSIS — Y929 Unspecified place or not applicable: Secondary | ICD-10-CM | POA: Diagnosis not present

## 2016-03-23 DIAGNOSIS — Z79899 Other long term (current) drug therapy: Secondary | ICD-10-CM | POA: Diagnosis not present

## 2016-03-23 DIAGNOSIS — S199XXA Unspecified injury of neck, initial encounter: Secondary | ICD-10-CM | POA: Diagnosis not present

## 2016-03-23 DIAGNOSIS — T148XXA Other injury of unspecified body region, initial encounter: Secondary | ICD-10-CM

## 2016-03-23 DIAGNOSIS — S0990XA Unspecified injury of head, initial encounter: Secondary | ICD-10-CM | POA: Diagnosis not present

## 2016-03-23 DIAGNOSIS — W19XXXA Unspecified fall, initial encounter: Secondary | ICD-10-CM

## 2016-03-23 LAB — BASIC METABOLIC PANEL
ANION GAP: 10 (ref 5–15)
BUN: 50 mg/dL — AB (ref 6–20)
CHLORIDE: 102 mmol/L (ref 101–111)
CO2: 27 mmol/L (ref 22–32)
Calcium: 11 mg/dL — ABNORMAL HIGH (ref 8.9–10.3)
Creatinine, Ser: 2.17 mg/dL — ABNORMAL HIGH (ref 0.61–1.24)
GFR calc Af Amer: 30 mL/min — ABNORMAL LOW (ref >60)
GFR, EST NON AFRICAN AMERICAN: 26 mL/min — AB (ref >60)
GLUCOSE: 115 mg/dL — AB (ref 65–99)
POTASSIUM: 3.5 mmol/L (ref 3.5–5.1)
Sodium: 139 mmol/L (ref 135–145)

## 2016-03-23 LAB — CBC
HEMATOCRIT: 33.7 % — AB (ref 39.0–52.0)
HEMOGLOBIN: 10.9 g/dL — AB (ref 13.0–17.0)
MCH: 29.7 pg (ref 26.0–34.0)
MCHC: 32.3 g/dL (ref 30.0–36.0)
MCV: 91.8 fL (ref 78.0–100.0)
Platelets: 147 10*3/uL — ABNORMAL LOW (ref 150–400)
RBC: 3.67 MIL/uL — ABNORMAL LOW (ref 4.22–5.81)
RDW: 14.3 % (ref 11.5–15.5)
WBC: 7.3 10*3/uL (ref 4.0–10.5)

## 2016-03-23 LAB — PROTIME-INR
INR: 1.62
Prothrombin Time: 19.4 seconds — ABNORMAL HIGH (ref 11.4–15.2)

## 2016-03-23 MED ORDER — CLONAZEPAM 0.25 MG PO TBDP: 0.1250 mg | ORAL_TABLET | Freq: Two times a day (BID) | ORAL | 1 refills | Status: DC

## 2016-03-23 NOTE — ED Provider Notes (Signed)
[facility] DEPT [facility] Provider Note   CSN: [id number] Arrival date & time: 03/23/16  1139     History   Chief Complaint Chief Complaint  Patient presents with  . Knee Pain    HPI Edward Woodward is a 82 y.o. male.  The history is provided by the patient and the spouse.  Fall  This is a recurrent problem. The current episode started 1 to 2 hours ago. The problem has been resolved. Associated symptoms include headaches (mild). Pertinent negatives include no chest pain, no abdominal pain and no shortness of breath. Nothing aggravates the symptoms. Nothing relieves the symptoms. He has tried nothing for the symptoms.   Patient reports that he was trying to pick up a depends off the floor when he tipped forward, lost his balance and landed onto his knees and then the rest of his body. Unsure about head trauma. Does report no loss of consciousness. Complains only of right knee pain and mild headache.  Past Medical History:  Diagnosis Date  . CAD (coronary artery disease)     NYHA Class 1B, CABG 1985  . Collagen vascular disease ([place])   . Degeneration of lumbar or lumbosacral intervertebral disc   . Depressive disorder, not elsewhere classified   . GERD (gastroesophageal reflux disease)   . HTN (hypertension)   . Hypercholesteremia   . Neuromuscular disorder (HCC)    neuropathy  . Neuropathy ([place])   . OSA on CPAP   . PE (pulmonary embolism) 03/2015  . Presence of permanent cardiac pacemaker   . Prostate cancer ([place])   . Sinoatrial node dysfunction [facility])     Patient Active Problem List   Diagnosis Date Noted  . 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 Gait instability 03/06/2016  . Palliative care encounter   . Weakness generalized   . Supratherapeutic INR 01/13/2016  . Encephalopathy acute 01/13/2016  . Falls 01/12/2016  . Failure to thrive in adult 01/12/2016  . Retroperitoneal mass 01/12/2016  . Hydronephrosis 01/12/2016  . AKI (acute kidney injury) ([place]) 01/12/2016  . Weakness  01/12/2016  . Dehydration 12/19/2015  . Long term current use of anticoagulant therapy 12/19/2015  . Hypokalemia 12/19/2015  . Peripheral edema 12/19/2015  . Rectal bleeding 12/19/2015  . Prostate cancer ([place]) 12/19/2015  . Chronic diastolic heart failure ([place]) 08/24/2015  . Pain in the chest   . NSTEMI (non-ST elevated myocardial infarction) ([place])   . Iron deficiency anemia due to chronic blood loss   . Chest pain 08/23/2015  . Encounter for chemotherapy management 05/06/2015  . Pulmonary embolus ([place]) 04/01/2015  . CKD (chronic kidney disease), stage III 04/01/2015  . Chronic anemia 04/01/2015  . Acute respiratory failure with hypoxia ([place]) 04/01/2015  . Sinus node dysfunction chronotropic incompetence 08/23/2013  . Cardiac pacemaker -[employer] 08/23/2013  . Atrial fibrillation ([place]) 08/23/2013  . Fall 07/11/2013  . Dyspnea 07/10/2013  . OSA on CPAP 07/10/2013  . Peripheral vascular disease ([place]) 06/07/2013  . Atherosclerosis of coronary artery bypass graft of native heart without angina pectoris 06/07/2013  . Hyperlipidemia 06/07/2013  . Essential hypertension, benign 06/07/2013  . Other malignant lymphomas, unspecified site, extranodal and solid organ sites 07/03/2011    Past Surgical History:  Procedure Laterality Date  . CARDIAC CATHETERIZATION N/A 08/27/2015   Procedure: Left Heart Cath and Cors/Grafts Angiography;  Surgeon: Belva Crome, MD;  Location: [facility] CV LAB;  Service: Cardiovascular;  Laterality: N/A;  . COLONOSCOPY N/A 08/26/2015   Procedure: COLONOSCOPY;  Surgeon: Clarene Essex,  MD;  Location: [facility] ENDOSCOPY;  Service: Endoscopy;  Laterality: N/A;  . CORONARY ANGIOPLASTY WITH STENT PLACEMENT     s/p bare metal stent implant SVG-diagonal 11/23/05, s/p BM stent implantation SVG diagonal 10/22/06 (feeds LAD), and 05/2010 with DES to left circumflex  . CORONARY ARTERY BYPASS GRAFT    . ENDARTERECTOMY    . ESOPHAGOGASTRODUODENOSCOPY (EGD) WITH PROPOFOL N/A  08/26/2015   Procedure: ESOPHAGOGASTRODUODENOSCOPY (EGD) WITH PROPOFOL;  Surgeon: Clarene Essex, MD;  Location: [facility] ENDOSCOPY;  Service: Endoscopy;  Laterality: N/A;  . EXPLORATORY LAPAROTOMY    . LUMBAR FUSION    . PACEMAKER INSERTION    . ROTATOR CUFF REPAIR         Home Medications    Prior to Admission medications   Medication Sig Start Date End Date Taking? Authorizing Provider  abiraterone Acetate (ZYTIGA) 250 MG tablet Take 4 tablets (1,000 mg total) by mouth daily. Take on an empty stomach 1 hour before or 2 hours after a meal 01/23/16   Ladell Pier, MD  atorvastatin (LIPITOR) 40 MG tablet Take 40 mg by mouth every other day.    Historical Provider, MD  benzocaine (ORAJEL) 10 % mucosal gel Use as directed in the mouth or throat 4 (four) times daily as needed for mouth pain. 01/17/16   Maryann Mikhail, DO  Cholecalciferol (VITAMIN D3) 2000 UNITS TABS Take 2 tablets by mouth daily.     Historical Provider, MD  citalopram (CELEXA) 20 MG tablet Take 20 mg by mouth daily.      Historical Provider, MD  clonazePAM (KLONOPIN) 0.25 MG disintegrating tablet Take 0.5 tablets (0.125 mg total) by mouth 2 (two) times daily. 03/23/16   Donika Keith Rake, DO  clonazePAM (KLONOPIN) 0.5 MG tablet Take half tablet at bedtime x 3 days, then increase to half tablet twice daily. 03/06/16   Donika Keith Rake, DO  Cyanocobalamin (VITAMIN B-12 CR) 1000 MCG TBCR Take 1 tablet by mouth daily. 04/08/10   Historical Provider, MD  fluticasone (FLONASE) 50 MCG/ACT nasal spray Place 1 spray into both nostrils daily. 01/17/16   Maryann Mikhail, DO  folic acid (FOLVITE) 1 MG tablet Take 1 tablet (1 mg total) by mouth daily. 08/28/15   Brittainy Erie Noe, PA-C  isosorbide mononitrate (IMDUR) 30 MG 24 hr tablet Take 1 tablet (30 mg total) by mouth daily. 08/28/15   Brittainy Erie Noe, PA-C  Melatonin 3 MG TABS Take 3 mg by mouth at bedtime.    Historical Provider, MD  metoprolol succinate (TOPROL-XL) 50 MG 24 hr tablet Take 1  tablet (50 mg total) by mouth daily. Take with or immediately following a meal. 10/21/15   Deboraha Sprang, MD  Multiple Vitamin (MULTIVITAMIN) capsule Take 1 capsule by mouth daily.    Historical Provider, MD  nitroGLYCERIN (NITROLINGUAL) 0.4 MG/SPRAY spray Place 1 spray under the tongue every 5 (five) minutes x 3 doses as needed for chest pain. 08/28/15   Brittainy Erie Noe, PA-C  pantoprazole (PROTONIX) 40 MG tablet Take 1 tablet (40 mg total) by mouth 2 (two) times daily before a meal. 08/28/15   Brittainy Erie Noe, PA-C  Polyethyl Glycol-Propyl Glycol (SYSTANE) 0.4-0.3 % SOLN Place 1 drop into both eyes at bedtime.     Historical Provider, MD  predniSONE (DELTASONE) 5 MG tablet Take 5 mg by mouth daily.    Historical Provider, MD  predniSONE (DELTASONE) 5 MG tablet TAKE ONE-HALF TABLET BY MOUTH ONCE DAILY 03/04/16   Ladell Pier, MD  torsemide [facility])  20 MG tablet Take 20 mg by mouth 2 (two) times daily.    Historical Provider, MD  UBIQUINOL PO Take 1 tablet by mouth daily. CoQ10    Historical Provider, MD  warfarin (COUMADIN) 5 MG tablet Take one tablet (5mg ) PO every MWF. half tablet (2.5 mg)all other days. Or as directed by MD 03/12/16   Ladell Pier, MD    Family History Family History  Problem Relation Age of Onset  . Heart disease Father 67    Social History Social History  Substance Use Topics  . Smoking status: Never Smoker  . Smokeless tobacco: Never Used  . Alcohol use No     Allergies   Penicillins and Simvastatin   Review of Systems Review of Systems  Respiratory: Negative for shortness of breath.   Cardiovascular: Negative for chest pain.  Gastrointestinal: Negative for abdominal pain.  Neurological: Positive for headaches (mild).  All other systems reviewed and are negative.    Physical Exam Updated Vital Signs BP 141/58   Pulse 64   Temp 98.1 F (36.7 C) (Oral)   Resp 22   Ht 5\' 10"  (1.778 m)   Wt 225 lb (102.1 kg)   SpO2 99%   BMI 32.28 kg/m     Physical Exam  Constitutional: He is oriented to person, place, and time. He appears well-developed and well-nourished. No distress.  HENT:  Head: Normocephalic.  Right Ear: External ear normal.  Left Ear: External ear normal.  Mouth/Throat: Oropharynx is clear and moist.  Eyes: Conjunctivae and EOM are normal. Pupils are equal, round, and reactive to light. Right eye exhibits no discharge. Left eye exhibits no discharge. No scleral icterus.  Neck: Normal range of motion. Neck supple.  Cardiovascular: Regular rhythm and normal heart sounds.  Exam reveals no gallop and no friction rub.   No murmur heard. Pulses:      Radial pulses are 2+ on the right side, and 2+ on the left side.       Dorsalis pedis pulses are 2+ on the right side, and 2+ on the left side.  Pulmonary/Chest: Effort normal and breath sounds normal. No stridor. No respiratory distress.  Abdominal: Soft. He exhibits no distension. There is no tenderness.  Musculoskeletal:       Cervical back: He exhibits no bony tenderness.       Thoracic back: He exhibits no bony tenderness.       Lumbar back: He exhibits no bony tenderness.  Clavicle stable. Chest stable to AP/Lat compression Pelvis stable to Lat compression No obvious extremity deformity  Neurological: He is alert and oriented to person, place, and time. GCS eye subscore is 4. GCS verbal subscore is 5. GCS motor subscore is 6.  Moving all extremities   Skin: Skin is warm. Abrasion (bilateral knee and right elbow.) noted. He is not diaphoretic.     ED Treatments / Results  Labs (all labs ordered are listed, but only abnormal results are displayed) Labs Reviewed  CBC - Abnormal; Notable for the following:       Result Value   RBC 3.67 (*)    Hemoglobin 10.9 (*)    HCT 33.7 (*)    Platelets 147 (*)    All other components within normal limits  PROTIME-INR - Abnormal; Notable for the following:    Prothrombin Time 19.4 (*)    All other components within  normal limits  BASIC METABOLIC PANEL - Abnormal; Notable for the following:    Glucose, Bld  115 (*)    BUN 50 (*)    Creatinine, Ser 2.17 (*)    Calcium 11.0 (*)    GFR calc non Af Amer 26 (*)    GFR calc Af Amer 30 (*)    All other components within normal limits    EKG  EKG Interpretation None       Radiology Ct Head Wo Contrast  Result Date: 03/23/2016 CLINICAL DATA:  Fall.  Anticoagulated. EXAM: CT HEAD WITHOUT CONTRAST CT CERVICAL SPINE WITHOUT CONTRAST TECHNIQUE: Multidetector CT imaging of the head and cervical spine was performed following the standard protocol without intravenous contrast. Multiplanar CT image reconstructions of the cervical spine were also generated. COMPARISON:  01/12/2016 head CT and 04/15/2015 cervical spine CT. FINDINGS: CT HEAD FINDINGS Brain: No evidence of parenchymal hemorrhage or extra-axial fluid collection. No mass lesion, mass effect, or midline shift. No CT evidence of acute infarction. Generalized cerebral volume loss. Intracranial atherosclerosis. Nonspecific moderate subcortical and periventricular white matter hypodensity, most in keeping with chronic small vessel ischemic change. No ventriculomegaly. Vascular: No hyperdense vessel or unexpected calcification. Skull: No evidence of calvarial fracture. Sinuses/Orbits: The visualized paranasal sinuses are essentially clear. Bilateral scleral banding. Other:  The mastoid air cells are unopacified. CT CERVICAL SPINE FINDINGS No fracture is detected in the cervical spine. No prevertebral soft tissue swelling. There is straightening of the cervical spine, usually due to positioning and/or muscle spasm. Dens is well positioned between the lateral masses of C1. The lateral masses appear well-aligned. Moderate to severe cervical spondylosis, most prominent in the lower cervical spine at C5-6 and C6-7. Moderate to severe facet arthropathy bilaterally. Mild foraminal stenosis bilaterally at C3-4. Moderate  bilateral foraminal stenosis at C4-5. Mild foraminal stenosis on the right at C5-6. Moderate foraminal stenosis bilaterally at C6-7. No cervical spine subluxation. Visualized mastoid air cells appear clear. No evidence of intra-axial hemorrhage in the visualized brain. No gross cervical canal hematoma. No significant pulmonary nodules at the visualized lung apices. Stable heterogeneous thyroid gland with left thyroid lobe calcifications. Stable metallic densities in the left posterior neck fat. Partially visualized 2 lead left subclavian pacemaker. No cervical adenopathy or other significant neck soft tissue abnormality. IMPRESSION: 1. No evidence of acute intracranial abnormality. No evidence of calvarial fracture. 2. Generalized cerebral volume loss and moderate chronic small vessel ischemia. 3. No cervical spine fracture or subluxation. 4. Moderate to severe degenerative changes in the cervical spine as described. Electronically Signed   By: Ilona Sorrel M.D.   On: 03/23/2016 12:37   Ct Cervical Spine Wo Contrast  Result Date: 03/23/2016 CLINICAL DATA:  Fall.  Anticoagulated. EXAM: CT HEAD WITHOUT CONTRAST CT CERVICAL SPINE WITHOUT CONTRAST TECHNIQUE: Multidetector CT imaging of the head and cervical spine was performed following the standard protocol without intravenous contrast. Multiplanar CT image reconstructions of the cervical spine were also generated. COMPARISON:  01/12/2016 head CT and 04/15/2015 cervical spine CT. FINDINGS: CT HEAD FINDINGS Brain: No evidence of parenchymal hemorrhage or extra-axial fluid collection. No mass lesion, mass effect, or midline shift. No CT evidence of acute infarction. Generalized cerebral volume loss. Intracranial atherosclerosis. Nonspecific moderate subcortical and periventricular white matter hypodensity, most in keeping with chronic small vessel ischemic change. No ventriculomegaly. Vascular: No hyperdense vessel or unexpected calcification. Skull: No evidence of  calvarial fracture. Sinuses/Orbits: The visualized paranasal sinuses are essentially clear. Bilateral scleral banding. Other:  The mastoid air cells are unopacified. CT CERVICAL SPINE FINDINGS No fracture is detected in the cervical spine. No prevertebral  soft tissue swelling. There is straightening of the cervical spine, usually due to positioning and/or muscle spasm. Dens is well positioned between the lateral masses of C1. The lateral masses appear well-aligned. Moderate to severe cervical spondylosis, most prominent in the lower cervical spine at C5-6 and C6-7. Moderate to severe facet arthropathy bilaterally. Mild foraminal stenosis bilaterally at C3-4. Moderate bilateral foraminal stenosis at C4-5. Mild foraminal stenosis on the right at C5-6. Moderate foraminal stenosis bilaterally at C6-7. No cervical spine subluxation. Visualized mastoid air cells appear clear. No evidence of intra-axial hemorrhage in the visualized brain. No gross cervical canal hematoma. No significant pulmonary nodules at the visualized lung apices. Stable heterogeneous thyroid gland with left thyroid lobe calcifications. Stable metallic densities in the left posterior neck fat. Partially visualized 2 lead left subclavian pacemaker. No cervical adenopathy or other significant neck soft tissue abnormality. IMPRESSION: 1. No evidence of acute intracranial abnormality. No evidence of calvarial fracture. 2. Generalized cerebral volume loss and moderate chronic small vessel ischemia. 3. No cervical spine fracture or subluxation. 4. Moderate to severe degenerative changes in the cervical spine as described. Electronically Signed   By: Ilona Sorrel M.D.   On: 03/23/2016 12:37   Dg Knee Complete 4 Views Right  Result Date: 03/23/2016 CLINICAL DATA:  Status post fall. EXAM: RIGHT KNEE - COMPLETE 4+ VIEW COMPARISON:  None FINDINGS: No evidence of fracture, dislocation, or joint effusion. Mild degenerative changes are noted involving the medial  compartment and patellofemoral compartment. Soft tissues are unremarkable. IMPRESSION: 1. No acute findings. 2. Mild degenerative changes. Electronically Signed   By: Kerby Moors M.D.   On: 03/23/2016 12:06    Procedures Procedures (including critical care time)  Medications Ordered in ED Medications - No data to display   Initial Impression / Assessment and Plan / ED Course  I have reviewed the triage vital signs and the nursing notes.  Pertinent labs & imaging results that were available during my care of the patient were reviewed by me and considered in my medical decision making (see chart for details).  Clinical Course    Mechanical fall from standing. No LOC or amnesia to the event. Patient is on warfarin. Reports last INR was 1.6 on Thursday. Complains of right knee pain. Exam with abrasions on bilateral knees. Plain film of the right knee without evidence of acute fracture. CT of the head and cervical spine without evidence of acute injury.  Screening labs obtained given comorbidities which revealed a stable hemoglobin, INR of 1.6 and stable renal function.  Patient safe for discharge with strict return precautions. Patient to follow-up with primary care doctor.  Final Clinical Impressions(s) / ED Diagnoses   Final diagnoses:  Right knee pain  Fall, initial encounter  Abrasion   Disposition: Discharge  Condition: Good  I have discussed the results, Dx and Tx plan with the patient who expressed understanding and agree(s) with the plan. Discharge instructions discussed at great length. The patient was given strict return precautions who verbalized understanding of the instructions. No further questions at time of discharge.    Current Discharge Medication List      Follow Up: Lajean Manes, MD [address] [place] [address] [place] [address] [phone]  Call  As needed      Fatima Blank, MD 03/23/16 [phone]

## 2016-03-23 NOTE — ED Triage Notes (Signed)
He was picking something up and fell this am. Injury to his right knee. Abrasion to his knees. He walks with a walker.

## 2016-03-23 NOTE — ED Notes (Signed)
MD at bedside. 

## 2016-03-23 NOTE — Telephone Encounter (Signed)
Rx faxed

## 2016-03-23 NOTE — Telephone Encounter (Signed)
Wife called and is at the ER with patient.  He has fallen 3 times since they have seen Dr. Posey Pronto.  Wife would like the new rx for Klonopin for 0.125 called into [facility]. Wondering if the patient needs an appointment before the one scheduled.  Please call wife if needed.

## 2016-03-26 ENCOUNTER — Ambulatory Visit: Payer: Medicare Other | Admitting: Physical Therapy

## 2016-03-26 DIAGNOSIS — R208 Other disturbances of skin sensation: Secondary | ICD-10-CM | POA: Diagnosis not present

## 2016-03-26 DIAGNOSIS — R2689 Other abnormalities of gait and mobility: Secondary | ICD-10-CM

## 2016-03-26 DIAGNOSIS — M6281 Muscle weakness (generalized): Secondary | ICD-10-CM

## 2016-03-26 DIAGNOSIS — R2681 Unsteadiness on feet: Secondary | ICD-10-CM | POA: Diagnosis not present

## 2016-03-26 NOTE — Patient Instructions (Signed)
Week 1 Week 2 Week 3 Week 4 Week 5  Yellow Folder Exercises       Walking Program          -  -  -  -  -  -  - Place a tally mark in the Week # every time you complete your exercises and your walking program - Try to have at least 3 tally marks per week for each the exercises and the walking program

## 2016-03-26 NOTE — Therapy (Signed)
[facility] [address] [place], [place], [address] Phone: [phone]   Fax:  [phone]  Physical Therapy Treatment  Patient Details  Name: Edward Woodward MRN: [id number] Date of Birth: 1926/11/22 Referring Provider: Lajean Manes, MD  Encounter Date: 03/26/2016      PT End of Session - 03/26/16 1750    Visit Number 6   Number of Visits 17   Date for PT Re-Evaluation 05/01/16   Authorization Type Medicare primary; AARP secondary   PT Start Time 1320   PT Stop Time 1400   PT Time Calculation (min) 40 min   Equipment Utilized During Treatment Gait belt   Activity Tolerance Patient limited by fatigue  Requires frequent seated rest breaks due to fatigue   Behavior During Therapy [facility] for tasks assessed/performed      Past Medical History:  Diagnosis Date  . CAD (coronary artery disease)     NYHA Class 1B, CABG 1985  . Collagen vascular disease ([place])   . Degeneration of lumbar or lumbosacral intervertebral disc   . Depressive disorder, not elsewhere classified   . GERD (gastroesophageal reflux disease)   . HTN (hypertension)   . Hypercholesteremia   . Neuromuscular disorder (HCC)    neuropathy  . Neuropathy ([place])   . OSA on CPAP   . PE (pulmonary embolism) 03/2015  . Presence of permanent cardiac pacemaker   . Prostate cancer ([place])   . Sinoatrial node dysfunction (HCC)     Past Surgical History:  Procedure Laterality Date  . CARDIAC CATHETERIZATION N/A 08/27/2015   Procedure: Left Heart Cath and Cors/Grafts Angiography;  Surgeon: Belva Crome, MD;  Location: [facility] CV LAB;  Service: Cardiovascular;  Laterality: N/A;  . COLONOSCOPY N/A 08/26/2015   Procedure: COLONOSCOPY;  Surgeon: Clarene Essex, MD;  Location: [facility] ENDOSCOPY;  Service: Endoscopy;  Laterality: N/A;  . CORONARY ANGIOPLASTY WITH STENT PLACEMENT     s/p bare metal stent implant SVG-diagonal 11/23/05, s/p BM stent implantation SVG diagonal  10/22/06 (feeds LAD), and 05/2010 with DES to left circumflex  . CORONARY ARTERY BYPASS GRAFT    . ENDARTERECTOMY    . ESOPHAGOGASTRODUODENOSCOPY (EGD) WITH PROPOFOL N/A 08/26/2015   Procedure: ESOPHAGOGASTRODUODENOSCOPY (EGD) WITH PROPOFOL;  Surgeon: Clarene Essex, MD;  Location: [facility] ENDOSCOPY;  Service: Endoscopy;  Laterality: N/A;  . EXPLORATORY LAPAROTOMY    . LUMBAR FUSION    . PACEMAKER INSERTION    . ROTATOR CUFF REPAIR      There were no vitals filed for this visit.      Subjective Assessment - 03/26/16 1326    Subjective "[name] fell 2 days ago when carrying a bunch of dirty briefs from the bathroom to the kitchen to throw away. He dropped one on the floor and tried to pick it up by himself and fell. He had a cut on his left knee and his left leg started spasming involuntarily; so I took him to the ED and they said it was probably because of the coumadin...that maybe he bruised one of his veins." Pt unable to recall if he has been performing home exercises or walking program.   Patient is accompained by: Family member   Pertinent History Likes to be called "[name]".   Precautions: pacemaker.  PMH significant for: HTN, HLD, A-Fib with pacemaker, OSA on CPAP, NSTEMI s/p CABG, CKD stage III, DVT LLE on chronic coumadin, peripheral neuropathy, anemia, prostate cancer, Non-Hodgkin's Lymphoma (inactive, but suspicion for reoccurrence at this time,per wife)  Diagnostic tests CT of head (01/12/15): age-related atrophy and chronic microvascular ischemic disease.   Patient Stated Goals for patient to be able to go to the bathroom on his own; to be able to get up off the floor after fall; to stand up from toilet without needing physical assistance from wife   Currently in Pain? No/denies                         [facility] Adult PT Treatment/Exercise - 03/26/16 0001      Transfers   Transfers Sit to Stand;Stand to Sit   Sit to Stand 5: Supervision   Sit to Stand Details (indicate cue type and  reason) required subtle to min cueing for setup; consistently performed with (S), no hands-on assist required   Stand to Sit 5: Supervision     Self-Care   Self-Care Other Self-Care Comments   Other Self-Care Comments  Due to patient having sustained 2 falls within past 2 weeks when bending over to pick up objects from floor, added velcro to walker to enable wife to velco reacher to walker to cue pt to utilize reaching rather than bending over. Provided wife with extra velcro to add to patient's reacher (at home). Also, provided patient with checklist to enable pt to keep track of whether or not he has performed exercises and walking program at home. See Pt Instructions for details.             Balance Exercises - 03/26/16 1745      OTAGO PROGRAM   Backwards Walking Support   Walking and Turning Around --  deferred due to safety concerns   Sideways Walking Assistive device  BUE support at countertop   Tandem Stance 10 seconds, support  semi tandem stance with single UE support   Tandem Walk --  attempted semi-tandem walk; deferred due to safety concerns   Overall OTAGO Comments Wife agreeable to supervising patient (no more than arms-length away) with all exercises             PT Short Term Goals - 03/02/16 1308      PT SHORT TERM GOAL #1   Title Pt will perform HEP with assist of wife to maximize functional gains made in PT.  (Target date: 03/30/16)     PT SHORT TERM GOAL #2   Title Complete Berg and improve score by 5 points from baseline to indicate improved standing balance.  (03/30/16)     PT SHORT TERM GOAL #3   Title Pt will improve TUG time from 26.15 seconds to </= 21 seconds to indicate increased efficiency of mobility. (03/30/16)     PT SHORT TERM GOAL #4   Title Pt will ambulate x200' over level, indoor surfaces with mod I using LRAD to indicate increased safety with household mobility.  (03/30/16)     PT SHORT TERM GOAL #5   Title Pt will consistently perform  sit <> stand from standard chair with mod I, no cueing to indicate increased safety with functional transfers. (03/30/16)     Additional Short Term Goals   Additional Short Term Goals Yes     PT SHORT TERM GOAL #6   Title Pt will consistently perform supine <> sit with mod I, no cueing to indicate increased independence with bed mobility. (03/30/16)           PT Long Term Goals - 03/02/16 1312      PT LONG TERM GOAL #1  Title Pt will improve Berg by 10 points from baseline to indicate decreased fall risk.  (Target date: 04/27/16)     PT LONG TERM GOAL #2   Title Pt will improve TUG time from 26.15 seconds to < /= 16 seconds to indicate increased efficiency of mobility. (04/27/16)     PT LONG TERM GOAL #3   Title Pt will improve gait velocity from 1.9 ft/sec to >/= 1.31 ft/sec to indicate pt status of community ambulator.  (109/2/17)     PT LONG TERM GOAL #4   Title Pt will negotiate standard ramp and curb step with supervision using LRAD to indicate increased safety transversing community obstacles. (04/27/16)     PT LONG TERM GOAL #5   Title Pw will ambulate > 500' over unlevel, paved surfaces with supervision using LRAD to indicate increased activity tolerance, independence with community mobility.  (04/27/16)     Additional Long Term Goals   Additional Long Term Goals Yes     PT LONG TERM GOAL #6   Title Pt will transfer from supine on floor to seated on EOM using standard chair/mat with min A to indicate increased safety with fall recovery.  (04/27/16)               Plan - 03/26/16 1751    Clinical Impression Statement Patient sustained another fall on 8/29 when pt attempting to bend over to pick up object from floor. Pt seen in ED; no injuries sustained, other than laceration to knee. Wife provided order from ED provider to return to PT without restrictions; order scanned into [employer]. This is the second fall in the past 2 weeks due to pt attempting to bend over. Therefore,  attempted velcro to walker to enable wife to stick reacher to walker (as cue for pt to use reacher than than bending over). Continue to recommend that wife provide close supervision at all times when pt standing or ambulating.    Rehab Potential Good   Clinical Impairments Affecting Rehab Potential Positive: very supportive wife; negative: complex PMH, ST memory impairments   PT Frequency 2x / week   PT Duration 8 weeks   PT Treatment/Interventions Vasopneumatic Device   PT Next Visit Plan Complete OTAGO program. Ask about walking program (compliant?). Continue functional strengthening and standing balance.   Consulted and Agree with Plan of Care Patient;Family member/caregiver   Family Member Consulted wife, [name]      Patient will benefit from skilled therapeutic intervention in order to improve the following deficits and impairments:  Decreased balance, Decreased mobility, Abnormal gait, Decreased activity tolerance, Decreased strength, Decreased knowledge of use of DME, Postural dysfunction, Increased edema, Impaired sensation, Decreased endurance  Visit Diagnosis: Other abnormalities of gait and mobility  Muscle weakness (generalized)  Unsteadiness on feet  Other disturbances of skin sensation     Problem List Patient Active Problem List   Diagnosis Date Noted  . 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 Gait instability 03/06/2016  . Palliative care encounter   . Weakness generalized   . Supratherapeutic INR 01/13/2016  . Encephalopathy acute 01/13/2016  . Falls 01/12/2016  . Failure to thrive in adult 01/12/2016  . Retroperitoneal mass 01/12/2016  . Hydronephrosis 01/12/2016  . AKI (acute kidney injury) ([place]) 01/12/2016  . Weakness 01/12/2016  . Dehydration 12/19/2015  . Long term current use of anticoagulant therapy 12/19/2015  . Hypokalemia 12/19/2015  . Peripheral edema 12/19/2015  . Rectal bleeding 12/19/2015  . Prostate cancer ([place]) 12/19/2015  . Chronic diastolic heart  failure ([place]) 08/24/2015  . Pain in the chest   . NSTEMI (non-ST elevated myocardial infarction) ([place])   . Iron deficiency anemia due to chronic blood loss   . Chest pain 08/23/2015  . Encounter for chemotherapy management 05/06/2015  . Pulmonary embolus ([place]) 04/01/2015  . CKD (chronic kidney disease), stage III 04/01/2015  . Chronic anemia 04/01/2015  . Acute respiratory failure with hypoxia ([place]) 04/01/2015  . Sinus node dysfunction chronotropic incompetence 08/23/2013  . Cardiac pacemaker -[employer] 08/23/2013  . Atrial fibrillation ([place]) 08/23/2013  . Fall 07/11/2013  . Dyspnea 07/10/2013  . OSA on CPAP 07/10/2013  . Peripheral vascular disease ([place]) 06/07/2013  . Atherosclerosis of coronary artery bypass graft of native heart without angina pectoris 06/07/2013  . Hyperlipidemia 06/07/2013  . Essential hypertension, benign 06/07/2013  . Other malignant lymphomas, unspecified site, extranodal and solid organ sites 07/03/2011    Billie Ruddy, PT, DPT [facility] [address] [place], [place], [address] Phone: [phone]   Fax:  [phone] 03/26/16, 5:56 PM  Name: COIT FILKINS MRN: [id number] Date of Birth: 1926-10-11

## 2016-03-27 DIAGNOSIS — G4733 Obstructive sleep apnea (adult) (pediatric): Secondary | ICD-10-CM | POA: Diagnosis not present

## 2016-03-27 DIAGNOSIS — G629 Polyneuropathy, unspecified: Secondary | ICD-10-CM | POA: Diagnosis not present

## 2016-03-27 DIAGNOSIS — I739 Peripheral vascular disease, unspecified: Secondary | ICD-10-CM | POA: Diagnosis not present

## 2016-03-27 DIAGNOSIS — I129 Hypertensive chronic kidney disease with stage 1 through stage 4 chronic kidney disease, or unspecified chronic kidney disease: Secondary | ICD-10-CM | POA: Diagnosis not present

## 2016-03-27 DIAGNOSIS — Z6831 Body mass index (BMI) 31.0-31.9, adult: Secondary | ICD-10-CM | POA: Diagnosis not present

## 2016-03-27 DIAGNOSIS — C61 Malignant neoplasm of prostate: Secondary | ICD-10-CM | POA: Diagnosis not present

## 2016-03-27 DIAGNOSIS — I5032 Chronic diastolic (congestive) heart failure: Secondary | ICD-10-CM | POA: Diagnosis not present

## 2016-03-27 DIAGNOSIS — E7800 Pure hypercholesterolemia, unspecified: Secondary | ICD-10-CM | POA: Diagnosis not present

## 2016-03-27 DIAGNOSIS — D631 Anemia in chronic kidney disease: Secondary | ICD-10-CM | POA: Diagnosis not present

## 2016-03-27 DIAGNOSIS — N184 Chronic kidney disease, stage 4 (severe): Secondary | ICD-10-CM | POA: Diagnosis not present

## 2016-03-27 DIAGNOSIS — I2510 Atherosclerotic heart disease of native coronary artery without angina pectoris: Secondary | ICD-10-CM | POA: Diagnosis not present

## 2016-03-27 DIAGNOSIS — N2581 Secondary hyperparathyroidism of renal origin: Secondary | ICD-10-CM | POA: Diagnosis not present

## 2016-03-31 ENCOUNTER — Other Ambulatory Visit: Payer: Self-pay | Admitting: Cardiology

## 2016-03-31 DIAGNOSIS — I5022 Chronic systolic (congestive) heart failure: Secondary | ICD-10-CM

## 2016-03-31 DIAGNOSIS — I2699 Other pulmonary embolism without acute cor pulmonale: Secondary | ICD-10-CM

## 2016-03-31 DIAGNOSIS — I214 Non-ST elevation (NSTEMI) myocardial infarction: Secondary | ICD-10-CM

## 2016-03-31 DIAGNOSIS — D500 Iron deficiency anemia secondary to blood loss (chronic): Secondary | ICD-10-CM

## 2016-03-31 DIAGNOSIS — N184 Chronic kidney disease, stage 4 (severe): Secondary | ICD-10-CM | POA: Diagnosis not present

## 2016-04-01 ENCOUNTER — Ambulatory Visit: Payer: Medicare Other | Attending: Geriatric Medicine | Admitting: Physical Therapy

## 2016-04-01 ENCOUNTER — Other Ambulatory Visit: Payer: Self-pay | Admitting: Nephrology

## 2016-04-01 DIAGNOSIS — R208 Other disturbances of skin sensation: Secondary | ICD-10-CM | POA: Insufficient documentation

## 2016-04-01 DIAGNOSIS — N184 Chronic kidney disease, stage 4 (severe): Secondary | ICD-10-CM

## 2016-04-01 DIAGNOSIS — R2681 Unsteadiness on feet: Secondary | ICD-10-CM | POA: Diagnosis not present

## 2016-04-01 DIAGNOSIS — M6281 Muscle weakness (generalized): Secondary | ICD-10-CM | POA: Diagnosis not present

## 2016-04-01 DIAGNOSIS — R2689 Other abnormalities of gait and mobility: Secondary | ICD-10-CM | POA: Insufficient documentation

## 2016-04-01 NOTE — Patient Instructions (Signed)
HIP: Hamstrings - Short Sitting    Rest leg on raised surface. Keep knee straight. Lift chest. Hinge forward at waist until you feel a gentle stretch in the back of your leg. Hold _45_ seconds. Perform 2-3 times per day on each leg.  Copyright  VHI. All rights reserved.

## 2016-04-01 NOTE — Telephone Encounter (Signed)
Request should be fwd to pt pcp

## 2016-04-01 NOTE — Therapy (Signed)
[facility] [address] [place], [place], [address] Phone: [phone]   Fax:  [phone]  Physical Therapy Treatment  Patient Details  Name: Edward Woodward MRN: [id number] Date of Birth: [birth year] Referring Provider: Lajean Manes, MD  Encounter Date: 04/01/2016      PT End of Session - 04/01/16 1221    Visit Number 7   Number of Visits 17   Date for PT Re-Evaluation 05/01/16   Authorization Type Medicare primary; AARP secondary   PT Start Time 1056   PT Stop Time 1150   PT Time Calculation (min) 54 min   Equipment Utilized During Treatment Gait belt   Activity Tolerance Patient limited by fatigue   Behavior During Therapy WFL for tasks assessed/performed      Past Medical History:  Diagnosis Date  . CAD (coronary artery disease)     NYHA Class 1B, CABG 1985  . Collagen vascular disease ([place])   . Degeneration of lumbar or lumbosacral intervertebral disc   . Depressive disorder, not elsewhere classified   . GERD (gastroesophageal reflux disease)   . HTN (hypertension)   . Hypercholesteremia   . Neuromuscular disorder (HCC)    neuropathy  . Neuropathy ([place])   . OSA on CPAP   . PE (pulmonary embolism) 03/2015  . Presence of permanent cardiac pacemaker   . Prostate cancer ([place])   . Sinoatrial node dysfunction (HCC)     Past Surgical History:  Procedure Laterality Date  . CARDIAC CATHETERIZATION N/A 08/27/2015   Procedure: Left Heart Cath and Cors/Grafts Angiography;  Surgeon: Belva Crome, MD;  Location: [facility] CV LAB;  Service: Cardiovascular;  Laterality: N/A;  . COLONOSCOPY N/A 08/26/2015   Procedure: COLONOSCOPY;  Surgeon: Clarene Essex, MD;  Location: [facility] ENDOSCOPY;  Service: Endoscopy;  Laterality: N/A;  . CORONARY ANGIOPLASTY WITH STENT PLACEMENT     s/p bare metal stent implant SVG-diagonal 11/23/05, s/p BM stent implantation SVG diagonal 10/22/06 (feeds LAD), and 05/2010 with DES to left  circumflex  . CORONARY ARTERY BYPASS GRAFT    . ENDARTERECTOMY    . ESOPHAGOGASTRODUODENOSCOPY (EGD) WITH PROPOFOL N/A 08/26/2015   Procedure: ESOPHAGOGASTRODUODENOSCOPY (EGD) WITH PROPOFOL;  Surgeon: Clarene Essex, MD;  Location: [facility] ENDOSCOPY;  Service: Endoscopy;  Laterality: N/A;  . EXPLORATORY LAPAROTOMY    . LUMBAR FUSION    . PACEMAKER INSERTION    . ROTATOR CUFF REPAIR      There were no vitals filed for this visit.      Subjective Assessment - 04/01/16 1058    Subjective Pt arrived to session with reacher velcroed to rolling walker. Pt reports no pain. Pt/wife deny falls. Feeling poorly today. Wife reports night sweats last night. Pt/wife looking forward to appt with oncologist tomorrow.    Patient is accompained by: Family member   Pertinent History Likes to be called "[name]".   Precautions: pacemaker.  PMH significant for: HTN, HLD, A-Fib with pacemaker, OSA on CPAP, NSTEMI s/p CABG, CKD stage III, DVT LLE on chronic coumadin, peripheral neuropathy, anemia, prostate cancer, Non-Hodgkin's Lymphoma (inactive, but suspicion for reoccurrence at this time,per wife)   Diagnostic tests CT of head (01/12/15): age-related atrophy and chronic microvascular ischemic disease.   Patient Stated Goals for patient to be able to go to the bathroom on his own; to be able to get up off the floor after fall; to stand up from toilet without needing physical assistance from wife   Currently in Pain? No/denies  [place] Adult PT Treatment/Exercise - 04/01/16 0001      Transfers   Transfers Sit to Stand;Stand to Sit   Sit to Stand 6: Modified independent (Device/Increase time)   Sit to Stand Details (indicate cue type and reason) with RW; no cueing required for setup, technique with sit <> stand   Stand to Sit 5: Supervision   Stand to Sit Details with RW; cueing required for safe proximity to sitting surface (especially when fatigued)     Ambulation/Gait    Ambulation/Gait Yes   Ambulation/Gait Assistance 5: Supervision   Ambulation Distance (Feet) 360 Feet  2 x100', 2 x75'   Assistive device Rolling walker;Other (Comment)  weighted with 5 lb weights x2   Gait Pattern Step-through pattern;Decreased stride length;Decreased hip/knee flexion - right;Decreased hip/knee flexion - left;Decreased dorsiflexion - right;Decreased dorsiflexion - left;Right foot flat;Left foot flat;Right flexed knee in stance;Left flexed knee in stance;Trunk flexed;Wide base of support  minimal B ankle PF during terminal stance   Ambulation Surface Level;Outdoor   Curb 4: Paramedic Details (indicate cue type and reason) with RW; x2 trials with min guard-min A, cueing for technique (foot placement when lifting RW onto/off of curb) and for sequencing (ascending leading with RLE, descending leading with LLE).     Self-Care   Self-Care Other Self-Care Comments   Other Self-Care Comments  Educated wife on placing chair next to step to enter home, as wife reports one of past 2 falls occurred when pt was fatigued and ascending step with RW.     Exercises   Exercises Other Exercises   Other Exercises  NuStep with BUE/BLE's x8 consecutive minutes with 0 resistance during initial 2 minutes, level 2 for subsequent 2 minutes, and level 5 for final 4 minutes with no difficulty. Standing B gastroc self-stretch attempted x45-sec per side; however, pt with difficulty coordinating movement while maintaining balance (despite BUE support). Therefore, transitioned to seated B hamstring, ankle PF self-stretch x45-sec per side; due to pt difficulty isolating gastroc stretch (due to pt inability to fully extend B knees), modified to B hamstring self-stretch 2 x45-sec holds with foot on 6" step.             Balance Exercises - 04/01/16 1103      OTAGO PROGRAM   Tandem Walk --  Deferred due to safety concerns   One Leg Stand 10 seconds, support  B finger tip support   Heel Walking  Support  modified to walking with heel first             PT Short Term Goals - 03/02/16 1308      PT SHORT TERM GOAL #1   Title Pt will perform HEP with assist of wife to maximize functional gains made in PT.  (Target date: 03/30/16)     PT SHORT TERM GOAL #2   Title Complete Berg and improve score by 5 points from baseline to indicate improved standing balance.  (03/30/16)     PT SHORT TERM GOAL #3   Title Pt will improve TUG time from 26.15 seconds to </= 21 seconds to indicate increased efficiency of mobility. (03/30/16)     PT SHORT TERM GOAL #4   Title Pt will ambulate x200' over level, indoor surfaces with mod I using LRAD to indicate increased safety with household mobility.  (03/30/16)     PT SHORT TERM GOAL #5   Title Pt will consistently perform sit <> stand from standard chair with mod I,  no cueing to indicate increased safety with functional transfers. (03/30/16)     Additional Short Term Goals   Additional Short Term Goals Yes     PT SHORT TERM GOAL #6   Title Pt will consistently perform supine <> sit with mod I, no cueing to indicate increased independence with bed mobility. (03/30/16)           PT Long Term Goals - 03/02/16 1312      PT LONG TERM GOAL #1   Title Pt will improve Berg by 10 points from baseline to indicate decreased fall risk.  (Target date: 04/27/16)     PT LONG TERM GOAL #2   Title Pt will improve TUG time from 26.15 seconds to < /= 16 seconds to indicate increased efficiency of mobility. (04/27/16)     PT LONG TERM GOAL #3   Title Pt will improve gait velocity from 1.9 ft/sec to >/= 1.31 ft/sec to indicate pt status of community ambulator.  (109/2/17)     PT LONG TERM GOAL #4   Title Pt will negotiate standard ramp and curb step with supervision using LRAD to indicate increased safety transversing community obstacles. (04/27/16)     PT LONG TERM GOAL #5   Title Pw will ambulate > 500' over unlevel, paved surfaces with supervision using LRAD to  indicate increased activity tolerance, independence with community mobility.  (04/27/16)     Additional Long Term Goals   Additional Long Term Goals Yes     PT LONG TERM GOAL #6   Title Pt will transfer from supine on floor to seated on EOM using standard chair/mat with min A to indicate increased safety with fall recovery.  (04/27/16)               Plan - 04/01/16 1223    Clinical Impression Statement Session focused on completing [place], continued endurance training, and curb step negotiation training with wife. Pt tolerated interventions, but did required frequent rest breaks. Wife reports pt is participating in both [place] and walking program at least 3 times per week.    Rehab Potential Good   Clinical Impairments Affecting Rehab Potential Positive: very supportive wife; negative: complex PMH, ST memory impairments   PT Frequency 2x / week   PT Duration 8 weeks   PT Treatment/Interventions ADLs/Self Care Home Management;DME Instruction;Gait training;Stair training;Functional mobility training;Therapeutic activities;Patient/family education;Neuromuscular re-education;Balance training;Therapeutic exercise;Vestibular   PT Next Visit Plan Check STG's.   Consulted and Agree with Plan of Care Patient;Family member/caregiver   Family Member Consulted wife, [name]      Patient will benefit from skilled therapeutic intervention in order to improve the following deficits and impairments:  Decreased balance, Decreased mobility, Abnormal gait, Decreased activity tolerance, Decreased strength, Decreased knowledge of use of DME, Postural dysfunction, Increased edema, Impaired sensation, Decreased endurance  Visit Diagnosis: Other abnormalities of gait and mobility  Muscle weakness (generalized)  Unsteadiness on feet  Other disturbances of skin sensation     Problem List Patient Active Problem List   Diagnosis Date Noted  . 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 Gait instability 03/06/2016   . Palliative care encounter   . Weakness generalized   . Supratherapeutic INR 01/13/2016  . Encephalopathy acute 01/13/2016  . Falls 01/12/2016  . Failure to thrive in adult 01/12/2016  . Retroperitoneal mass 01/12/2016  . Hydronephrosis 01/12/2016  . AKI (acute kidney injury) ([place]) 01/12/2016  . Weakness 01/12/2016  . Dehydration 12/19/2015  . Long term current use of anticoagulant  therapy 12/19/2015  . Hypokalemia 12/19/2015  . Peripheral edema 12/19/2015  . Rectal bleeding 12/19/2015  . Prostate cancer ([place]) 12/19/2015  . Chronic diastolic heart failure ([place]) 08/24/2015  . Pain in the chest   . NSTEMI (non-ST elevated myocardial infarction) ([place])   . Iron deficiency anemia due to chronic blood loss   . Chest pain 08/23/2015  . Encounter for chemotherapy management 05/06/2015  . Pulmonary embolus ([place]) 04/01/2015  . CKD (chronic kidney disease), stage III 04/01/2015  . Chronic anemia 04/01/2015  . Acute respiratory failure with hypoxia ([place]) 04/01/2015  . Sinus node dysfunction chronotropic incompetence 08/23/2013  . Cardiac pacemaker -[employer] 08/23/2013  . Atrial fibrillation ([place]) 08/23/2013  . Fall 07/11/2013  . Dyspnea 07/10/2013  . OSA on CPAP 07/10/2013  . Peripheral vascular disease ([place]) 06/07/2013  . Atherosclerosis of coronary artery bypass graft of native heart without angina pectoris 06/07/2013  . Hyperlipidemia 06/07/2013  . Essential hypertension, benign 06/07/2013  . Other malignant lymphomas, unspecified site, extranodal and solid organ sites 07/03/2011   Billie Ruddy, PT, DPT [facility] [address] [place], [place], [address] Phone: [phone]   Fax:  [phone] 04/01/16, 12:26 PM  Name: NAZIM SAFKO MRN: [id number] Date of Birth: 1926-12-05

## 2016-04-02 ENCOUNTER — Telehealth: Payer: Self-pay | Admitting: Oncology

## 2016-04-02 ENCOUNTER — Other Ambulatory Visit (HOSPITAL_BASED_OUTPATIENT_CLINIC_OR_DEPARTMENT_OTHER): Payer: Medicare Other

## 2016-04-02 ENCOUNTER — Ambulatory Visit
Admission: RE | Admit: 2016-04-02 | Discharge: 2016-04-02 | Disposition: A | Discharge status: Home / Self Care | Payer: Medicare Other | Source: Ambulatory Visit | Attending: Nephrology | Admitting: Nephrology

## 2016-04-02 ENCOUNTER — Ambulatory Visit (HOSPITAL_BASED_OUTPATIENT_CLINIC_OR_DEPARTMENT_OTHER): Payer: Medicare Other | Admitting: Oncology

## 2016-04-02 VITALS — BP 135/46 | HR 65 | Temp 98.1°F | Resp 18 | Ht 70.0 in | Wt 212.5 lb

## 2016-04-02 DIAGNOSIS — C61 Malignant neoplasm of prostate: Secondary | ICD-10-CM

## 2016-04-02 DIAGNOSIS — Z8546 Personal history of malignant neoplasm of prostate: Secondary | ICD-10-CM

## 2016-04-02 DIAGNOSIS — I2699 Other pulmonary embolism without acute cor pulmonale: Secondary | ICD-10-CM

## 2016-04-02 DIAGNOSIS — Z8572 Personal history of non-Hodgkin lymphomas: Secondary | ICD-10-CM | POA: Diagnosis not present

## 2016-04-02 DIAGNOSIS — N184 Chronic kidney disease, stage 4 (severe): Secondary | ICD-10-CM

## 2016-04-02 DIAGNOSIS — I4891 Unspecified atrial fibrillation: Secondary | ICD-10-CM

## 2016-04-02 LAB — CBC WITH DIFFERENTIAL/PLATELET
BASO%: 0.2 % (ref 0.0–2.0)
BASOS ABS: 0 10*3/uL (ref 0.0–0.1)
EOS%: 1.3 % (ref 0.0–7.0)
Eosinophils Absolute: 0.1 10*3/uL (ref 0.0–0.5)
HEMATOCRIT: 31.1 % — AB (ref 38.4–49.9)
HEMOGLOBIN: 10.2 g/dL — AB (ref 13.0–17.1)
LYMPH#: 1.2 10*3/uL (ref 0.9–3.3)
LYMPH%: 19.4 % (ref 14.0–49.0)
MCH: 29.2 pg (ref 27.2–33.4)
MCHC: 32.8 g/dL (ref 32.0–36.0)
MCV: 89.1 fL (ref 79.3–98.0)
MONO#: 0.4 10*3/uL (ref 0.1–0.9)
MONO%: 6.7 % (ref 0.0–14.0)
NEUT#: 4.6 10*3/uL (ref 1.5–6.5)
NEUT%: 72.4 % (ref 39.0–75.0)
NRBC: 0 % (ref 0–0)
Platelets: 132 10*3/uL — ABNORMAL LOW (ref 140–400)
RBC: 3.49 10*6/uL — ABNORMAL LOW (ref 4.20–5.82)
RDW: 14.5 % (ref 11.0–14.6)
WBC: 6.3 10*3/uL (ref 4.0–10.3)

## 2016-04-02 LAB — COMPREHENSIVE METABOLIC PANEL
ALBUMIN: 3.5 g/dL (ref 3.5–5.0)
ALK PHOS: 67 U/L (ref 40–150)
ALT: 9 U/L (ref 0–55)
AST: 16 U/L (ref 5–34)
Anion Gap: 12 mEq/L — ABNORMAL HIGH (ref 3–11)
BILIRUBIN TOTAL: 0.51 mg/dL (ref 0.20–1.20)
BUN: 45.3 mg/dL — AB (ref 7.0–26.0)
CALCIUM: 10.5 mg/dL — AB (ref 8.4–10.4)
CO2: 26 mEq/L (ref 22–29)
CREATININE: 2 mg/dL — AB (ref 0.7–1.3)
Chloride: 103 mEq/L (ref 98–109)
EGFR: 30 mL/min/{1.73_m2} — ABNORMAL LOW (ref >90)
Glucose: 127 mg/dl (ref 70–140)
POTASSIUM: 3.8 meq/L (ref 3.5–5.1)
Sodium: 141 mEq/L (ref 136–145)
TOTAL PROTEIN: 5.9 g/dL — AB (ref 6.4–8.3)

## 2016-04-02 LAB — PROTIME-INR
INR: 2.1 (ref 2.00–3.50)
PROTIME: 25.2 s — AB (ref 10.6–13.4)

## 2016-04-02 NOTE — Telephone Encounter (Signed)
Gave patient wife avs report and appointments for September  °

## 2016-04-02 NOTE — Progress Notes (Signed)
[place] OFFICE PROGRESS NOTE   Diagnosis: Prostate cancer, non-Hodgkin's lymphoma  INTERVAL HISTORY:   Mr. Frappier returns as scheduled. He has developed anorexia. He continues to have numbness and pain in the legs. He saw Dr.Deterding for evaluation of renal failure. He is scheduled for a renal ultrasound later today. Dr. Jimmy Footman recommends placement of ureter stents secondary to the previously noted hydronephrosis.   Objective:  Vital signs in last 24 hours:  Blood pressure (!) 135/46, pulse 65, temperature 98.1 F (36.7 C), temperature source Oral, resp. rate 18, height 5\' 10"  (1.778 m), weight 212 lb 8 oz (96.4 kg), SpO2 99 %.    HEENT: Neck without mass  Lymphatics: No cervical, supraclavicular, axillary, or inguinal nodes  Resp: Lungs with distant breath sounds and rales at the bases, no respiratory distress  Cardio: Regular rate and rhythm  GI: No hepatosplenomegaly, no mass  Vascular: No leg edema    Lab Results:  Lab Results  Component Value Date   WBC 6.3 04/02/2016   HGB 10.2 (L) 04/02/2016   HCT 31.1 (L) 04/02/2016   MCV 89.1 04/02/2016   PLT 132 (L) 04/02/2016   NEUTROABS 4.6 04/02/2016  BUN 45.3, creatinine 2.0, calcium 10.5, albumin 3.5   PT/INR 2.1  03/13/2016: PSA less than 0.1  Medications: I have reviewed the patient's current medications.  Assessment/Plan: 1. Non-Hodgkin's lymphoma (follicular grade 3 lymphoma) - status post 6 cycles of Cytoxan/prednisone/rituximab 07/01/2007 through 10/21/2007. He remains in clinical remission. 2. CT chest 04/01/2015 with no lymphadenopathy  CT abdomen/pelvis 05/01/2015 with mild progression of abdomen/pelvic lymphadenopathy 3. Prostate cancer - he has been treated with hormonal therapy, the PSA was stable on 10/15/2014  PSA increased 04/26/2015   Bone scan 05/09/2015 with unchanged increased activity in the thoracic and lumbar spine felt to most likely be degenerative.   Initiation  of Abiraterone and prednisone 05/11/2015. Normalization of the PSA on Abiraterone. 4. History of a normocytic anemia secondary to non-Hodgkin's lymphoma, chemotherapy, and renal insufficiency - progressive secondary to GI bleeding, stable 5. History of Mild thrombocytopenia  6. History of bilateral hydronephrosis. Renal ultrasound 09/26/2014 consistent with medical renal disease. No hydronephrosis. 7. Coronary artery disease - status post coronary artery stent placement. 8. History of noncalcified lung nodules. 9. History of severe neutropenia July 2009 - likely related to delayed toxicity from rituximab. 10. Macular degeneration. 11. Right middle lobe density on a CT of the chest 06/18/2010 measuring 2.5 x 1.6 cm with mildly increased metabolic activity (SUV max 2.5) on a PET scan 06/27/2010. The area of increased density was less confluent and appeared smaller on a restaging CT 09/08/2010. Right middle lobe "scarring" with no new or suspicious pulmonary nodule on a CT 08/04/2011. 12. Mild increased metabolic activity associated with several lymph nodes on the PET scan 06/27/2010 - likely related to lymphoma. No lymphadenopathy in the mediastinum or axillary areas on the CT 08/04/2011. 13. Right hip discomfort-he received a steroid injection by his primary physician without improvement. He saw Dr. Collier Salina and there was a question of a lytic process in the right pelvis. A bone scan on 09/21/2012 revealed no evidence of metastatic disease. 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 Renal insufficiency-progressive CT 01/12/2016 confirmed progressive hydronephrosis 15. Left leg DVT and pulmonary embolism 04/01/2015-on Coumadin 16. Admission 08/23/2015 with severe anemia and GI bleeding  Upper and lower endoscopy 08/26/2015 without a clear source for bleeding identified 17. Leg edema/anasarca-improved with leg wrapping and diuretics 18.  Anemia secondary to chronic  disease, renal failure, and possibly lymphoma 19. 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  history of Elevated calcium-potentially hypercalcemia of malignancy    Disposition:  Mr. Muckelroy has a history of prostate cancer and lymphoma. He has multiple comorbid conditions. He has progressive renal failure over the past several months with a history of hydronephrosis. He recently saw Dr. Jimmy Footman.   Mr. Youngblood and his family would like to proceed with placement of ureter stents. They will also consider a biopsy of the retroperitoneal soft tissue. The weight loss could be related to renal failure, or a progressive malignancy. We will discuss the risk/benefit of a biopsy when he returns in 2 weeks. I contacted Dr. Junious Silk. He will try to get Mr.  Adelene Amas a urology appointment next week.  He will return for an office visit here and further discussion in 2 weeks. He continues  Abiraterone for treatment of prostate cancer.   [name], MD  04/02/2016  2:37 PM

## 2016-04-03 ENCOUNTER — Ambulatory Visit: Payer: Medicare Other | Admitting: Physical Therapy

## 2016-04-03 DIAGNOSIS — R2681 Unsteadiness on feet: Secondary | ICD-10-CM | POA: Diagnosis not present

## 2016-04-03 DIAGNOSIS — R208 Other disturbances of skin sensation: Secondary | ICD-10-CM | POA: Diagnosis not present

## 2016-04-03 DIAGNOSIS — M6281 Muscle weakness (generalized): Secondary | ICD-10-CM | POA: Diagnosis not present

## 2016-04-03 DIAGNOSIS — R2689 Other abnormalities of gait and mobility: Secondary | ICD-10-CM

## 2016-04-03 LAB — PSA

## 2016-04-03 NOTE — Therapy (Signed)
[facility] [address] [place], [place], [address] Phone: [phone]   Fax:  [phone]  Physical Therapy Treatment  Patient Details  Name: Edward Woodward MRN: [id number] Date of Birth: 1927-02-02 Referring Provider: Lajean Manes, MD  Encounter Date: 04/03/2016      PT End of Session - 04/03/16 2024    Visit Number 8   Number of Visits 17   Date for PT Re-Evaluation 05/01/16   Authorization Type Medicare primary; AARP secondary   PT Start Time 1316   PT Stop Time 1400   PT Time Calculation (min) 44 min   Equipment Utilized During Treatment Gait belt   Activity Tolerance Patient limited by fatigue  Multiple seated rest breaks required   Behavior During Therapy [facility] for tasks assessed/performed      Past Medical History:  Diagnosis Date  . CAD (coronary artery disease)     NYHA Class 1B, CABG 1985  . Collagen vascular disease ([place])   . Degeneration of lumbar or lumbosacral intervertebral disc   . Depressive disorder, not elsewhere classified   . GERD (gastroesophageal reflux disease)   . HTN (hypertension)   . Hypercholesteremia   . Neuromuscular disorder (HCC)    neuropathy  . Neuropathy ([place])   . OSA on CPAP   . PE (pulmonary embolism) 03/2015  . Presence of permanent cardiac pacemaker   . Prostate cancer ([place])   . Sinoatrial node dysfunction (HCC)     Past Surgical History:  Procedure Laterality Date  . CARDIAC CATHETERIZATION N/A 08/27/2015   Procedure: Left Heart Cath and Cors/Grafts Angiography;  Surgeon: Belva Crome, MD;  Location: [facility] CV LAB;  Service: Cardiovascular;  Laterality: N/A;  . COLONOSCOPY N/A 08/26/2015   Procedure: COLONOSCOPY;  Surgeon: Clarene Essex, MD;  Location: [facility] ENDOSCOPY;  Service: Endoscopy;  Laterality: N/A;  . CORONARY ANGIOPLASTY WITH STENT PLACEMENT     s/p bare metal stent implant SVG-diagonal 11/23/05, s/p BM stent implantation SVG diagonal 10/22/06 (feeds  LAD), and 05/2010 with DES to left circumflex  . CORONARY ARTERY BYPASS GRAFT    . ENDARTERECTOMY    . ESOPHAGOGASTRODUODENOSCOPY (EGD) WITH PROPOFOL N/A 08/26/2015   Procedure: ESOPHAGOGASTRODUODENOSCOPY (EGD) WITH PROPOFOL;  Surgeon: Clarene Essex, MD;  Location: [facility] ENDOSCOPY;  Service: Endoscopy;  Laterality: N/A;  . EXPLORATORY LAPAROTOMY    . LUMBAR FUSION    . PACEMAKER INSERTION    . ROTATOR CUFF REPAIR      There were no vitals filed for this visit.      Subjective Assessment - 04/03/16 1324    Subjective Pt had a lot of medical appointments yesterday. Per wife, kidney doctor is considering placing ureteral stents. When discussing goals and progress, wife states, "I'm not sure how much progress this shows he is making, but he's staying off the floor and out of a nursing home, so what we're doing here is so important." Wife requesting for PT/SPT to help problem solve how pt may get into dining room chair more safely/easily.   Patient is accompained by: Family member   Pertinent History Likes to be called "[name]".   Precautions: pacemaker.  PMH significant for: HTN, HLD, A-Fib with pacemaker, OSA on CPAP, NSTEMI s/p CABG, CKD stage III, DVT LLE on chronic coumadin, peripheral neuropathy, anemia, prostate cancer, Non-Hodgkin's Lymphoma (inactive, but suspicion for reoccurrence at this time,per wife)   Diagnostic tests CT of head (01/12/15): age-related atrophy and chronic microvascular ischemic disease.   Patient Stated Goals  for patient to be able to go to the bathroom on his own; to be able to get up off the floor after fall; to stand up from toilet without needing physical assistance from wife   Currently in Pain? No/denies            [facility] PT Assessment - 04/03/16 0001      Standardized Balance Assessment   Standardized Balance Assessment Timed Up and Go Test     Berg Balance Test   Sit to Stand Able to stand  independently using hands   Standing Unsupported Able to stand 30  seconds unsupported   Sitting with Back Unsupported but Feet Supported on Floor or Stool Able to sit safely and securely 2 minutes   Stand to Sit Sits independently, has uncontrolled descent   Transfers Needs one person to assist   Standing Unsupported with Eyes Closed Able to stand 10 seconds with supervision   Standing Ubsupported with Feet Together Needs help to attain position but able to stand for 30 seconds with feet together   From Standing, Reach Forward with Outstretched Arm Can reach forward >12 cm safely (5")   From Standing Position, Pick up Object from Floor Unable to try/needs assist to keep balance   From Standing Position, Turn to Look Behind Over each Shoulder Needs assist to keep from losing balance and falling   Turn 360 Degrees Needs assistance while turning   Standing Unsupported, Alternately Place Feet on Step/Stool Needs assistance to keep from falling or unable to try   Standing Unsupported, One Foot in [employer] balance while stepping or standing   Standing on One Leg Unable to try or needs assist to prevent fall   Total Score 18     Timed Up and Go Test   TUG Normal TUG   Normal TUG (seconds) 31                     [facility] Adult PT Treatment/Exercise - 04/03/16 2030      Therapeutic Activites    Therapeutic Activities Other Therapeutic Activities   Other Therapeutic Activities To address wife's request to increase pt safety with sitting at dining room table, PT simulated home setup by elevating mat table to dining table-height and using armless chair. Simulated dining room chair oriented facing table; instructed pt to approach chair from side; to sit in chair from side; then, once seated on chair, to turn 45* to L to face table. Once seated in chair facing table, pt had difficulty standing from armless chair to enable wife to scoot chair forward. Completion of task limited by time constraint. Plan to further address at future session.                  PT Education - 04/03/16 2024    Education provided Yes   Education Details STG findings, progress, and functional implications.    Person(s) Educated Patient;Spouse   Methods Explanation   Comprehension Verbalized understanding          PT Short Term Goals - 04/03/16 1346      PT SHORT TERM GOAL #1   Title Pt will perform HEP with assist of wife to maximize functional gains made in PT.  (Target date: 03/30/16)   Baseline Met 9/6.   Status Achieved     PT SHORT TERM GOAL #2   Title Complete Berg and improve score by 5 points from baseline to indicate improved standing balance.  (03/30/16)  Baseline Baseline Berg score = 17/56;  9/8: Merrilee Jansky = 18/56   Status Partially Met     PT SHORT TERM GOAL #3   Title Pt will improve TUG time from 26.15 seconds to </= 21 seconds to indicate increased efficiency of mobility. (03/30/16)   Baseline 9/8: TUG = 31 seconds with RW   Status Not Met     PT SHORT TERM GOAL #4   Title Pt will ambulate x200' over level, indoor surfaces with mod I using LRAD to indicate increased safety with household mobility.  (03/30/16)   Status On-going     PT SHORT TERM GOAL #5   Title Pt will consistently perform sit <> stand from standard chair with mod I, no cueing to indicate increased safety with functional transfers. (03/30/16)   Status On-going     PT SHORT TERM GOAL #6   Title Pt will consistently perform supine <> sit with mod I, no cueing to indicate increased independence with bed mobility. (03/30/16)   Baseline Met 9/8.   Status Achieved           PT Long Term Goals - 04/03/16 2035      PT LONG TERM GOAL #1   Title Pt will improve Berg score from 17 to 22/56 to indicate improved functional standing balance.  (Target date: 04/27/16)   Baseline 9/8: REVISED due to pt progressing more slowly than anticipated.   Status Revised     PT LONG TERM GOAL #2   Title Pt will improve TUG time from 26.15 seconds to < /= 16 seconds to indicate  increased efficiency of mobility. (04/27/16)   Baseline 9/8: Deferred due to addition of weights to RW.    Status Deferred     PT LONG TERM GOAL #3   Title Pt will improve gait velocity from 1.9 ft/sec to >/= 1.31 ft/sec to indicate pt status of community ambulator.  (109/2/17)   Status On-going     PT LONG TERM GOAL #4   Title Pt will negotiate standard ramp and curb step with supervision using LRAD to indicate increased safety transversing community obstacles. (04/27/16)   Status On-going     PT LONG TERM GOAL #5   Title Pw will ambulate > 500' over unlevel, paved surfaces with supervision using LRAD to indicate increased activity tolerance, independence with community mobility.  (04/27/16)   Status On-going     Additional Long Term Goals   Additional Long Term Goals Yes     PT LONG TERM GOAL #6   Title Pt will transfer from supine on floor to seated on EOM using standard chair/mat with min A to indicate increased safety with fall recovery.  (04/27/16)   Baseline 9/8: Deferred due to safety concerns, significant weakness.   Status Deferred     PT LONG TERM GOAL #7   Title Pt will sustain no falls for 3 consecutive weeks to indicate increased safety with functional mobility.  (04/27/16)   Status New               Plan - 04/03/16 2033    Clinical Impression Statement Session focused on assessing STG's. Of 4 STG's assessed, pt met or partially met 4 goals. Berg score has improved by only 1 point (from 17 to 18/56) since PT evaluation. TUG time actually increased from 26 to 31 seconds; suspect this may be attributable to addition of 10 lbs in weights to RW to increase pt control of walker, to maintain RW in contact  with floor. Based on pt progress thusfar, modified Berg goal, deferred goals for TUG and floor transfer, and added goal for decreased frequency of falls. Pt/wife verbalized understading and were in full agreement with POC.    Rehab Potential Good   Clinical Impairments  Affecting Rehab Potential Positive: very supportive wife; negative: complex PMH, ST memory impairments   PT Frequency 2x / week   PT Duration 8 weeks   PT Treatment/Interventions ADLs/Self Care Home Management;DME Instruction;Gait training;Stair training;Functional mobility training;Therapeutic activities;Patient/family education;Neuromuscular re-education;Balance training;Therapeutic exercise;Vestibular   PT Next Visit Plan Finish checking STG's. Continue functional strengthening endurance, and balance training. Consider adding static standing with EC, wide BOS (in corner) to HEP.  When ~2 weeks left in POC, begin prepping pt/wife of DC by beginning fitness regimen Benjie Karvonen can call to see if [employer] near pt's home has Nu[employer])   Consulted and Agree with Plan of Care Patient;Family member/caregiver   Family Member Consulted wife, [name]      Patient will benefit from skilled therapeutic intervention in order to improve the following deficits and impairments:  Decreased balance, Decreased mobility, Abnormal gait, Decreased activity tolerance, Decreased strength, Decreased knowledge of use of DME, Postural dysfunction, Increased edema, Impaired sensation, Decreased endurance  Visit Diagnosis: Other abnormalities of gait and mobility  Muscle weakness (generalized)  Unsteadiness on feet  Other disturbances of skin sensation     Problem List Patient Active Problem List   Diagnosis Date Noted  . 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 Gait instability 03/06/2016  . Palliative care encounter   . Weakness generalized   . Supratherapeutic INR 01/13/2016  . Encephalopathy acute 01/13/2016  . Falls 01/12/2016  . Failure to thrive in adult 01/12/2016  . Retroperitoneal mass 01/12/2016  . Hydronephrosis 01/12/2016  . AKI (acute kidney injury) ([place]) 01/12/2016  . Weakness 01/12/2016  . Dehydration 12/19/2015  . Long term current use of anticoagulant therapy 12/19/2015  . Hypokalemia 12/19/2015  . Peripheral  edema 12/19/2015  . Rectal bleeding 12/19/2015  . Prostate cancer ([place]) 12/19/2015  . Chronic diastolic heart failure ([place]) 08/24/2015  . Pain in the chest   . NSTEMI (non-ST elevated myocardial infarction) ([place])   . Iron deficiency anemia due to chronic blood loss   . Chest pain 08/23/2015  . Encounter for chemotherapy management 05/06/2015  . Pulmonary embolus ([place]) 04/01/2015  . CKD (chronic kidney disease), stage III 04/01/2015  . Chronic anemia 04/01/2015  . Acute respiratory failure with hypoxia ([place]) 04/01/2015  . Sinus node dysfunction chronotropic incompetence 08/23/2013  . Cardiac pacemaker -[employer] 08/23/2013  . Atrial fibrillation ([place]) 08/23/2013  . Fall 07/11/2013  . Dyspnea 07/10/2013  . OSA on CPAP 07/10/2013  . Peripheral vascular disease ([place]) 06/07/2013  . Atherosclerosis of coronary artery bypass graft of native heart without angina pectoris 06/07/2013  . Hyperlipidemia 06/07/2013  . Essential hypertension, benign 06/07/2013  . Other malignant lymphomas, unspecified site, extranodal and solid organ sites 07/03/2011    Billie Ruddy, PT, DPT [facility] [address] [place], [place], [address] Phone: [phone]   Fax:  [phone] 04/03/16, 8:49 PM  Name: Edward Woodward MRN: [id number] Date of Birth: June 16, 1927

## 2016-04-04 NOTE — Telephone Encounter (Signed)
We have not prescribed folic acid. This should go through the primary physician.

## 2016-04-06 ENCOUNTER — Encounter: Payer: Self-pay | Admitting: Physical Therapy

## 2016-04-06 ENCOUNTER — Ambulatory Visit: Payer: Medicare Other | Admitting: Physical Therapy

## 2016-04-06 ENCOUNTER — Other Ambulatory Visit: Payer: Self-pay | Admitting: Oncology

## 2016-04-06 DIAGNOSIS — R208 Other disturbances of skin sensation: Secondary | ICD-10-CM | POA: Diagnosis not present

## 2016-04-06 DIAGNOSIS — R2689 Other abnormalities of gait and mobility: Secondary | ICD-10-CM

## 2016-04-06 DIAGNOSIS — R2681 Unsteadiness on feet: Secondary | ICD-10-CM | POA: Diagnosis not present

## 2016-04-06 DIAGNOSIS — M6281 Muscle weakness (generalized): Secondary | ICD-10-CM | POA: Diagnosis not present

## 2016-04-07 ENCOUNTER — Other Ambulatory Visit: Payer: Self-pay | Admitting: Urology

## 2016-04-07 ENCOUNTER — Other Ambulatory Visit: Payer: Self-pay | Admitting: Oncology

## 2016-04-07 NOTE — Therapy (Signed)
[facility] [address] [place], [place], [address] Phone: [phone]   Fax:  [phone]  Physical Therapy Treatment  Patient Details  Name: Edward Woodward MRN: [id number] Date of Birth: 12/22/1926 Referring Provider: Lajean Manes, MD  Encounter Date: 04/06/2016      PT End of Session - 04/06/16 1331    Visit Number 9   Number of Visits 17   Date for PT Re-Evaluation 05/01/16   Authorization Type Medicare primary; AARP secondary   PT Start Time 1320   PT Stop Time 1400   PT Time Calculation (min) 40 min   Equipment Utilized During Treatment Gait belt   Activity Tolerance Patient limited by fatigue  Multiple seated rest breaks required   Behavior During Therapy [facility] for tasks assessed/performed      Past Medical History:  Diagnosis Date  . CAD (coronary artery disease)     NYHA Class 1B, CABG 1985  . Collagen vascular disease ([place])   . Degeneration of lumbar or lumbosacral intervertebral disc   . Depressive disorder, not elsewhere classified   . GERD (gastroesophageal reflux disease)   . HTN (hypertension)   . Hypercholesteremia   . Neuromuscular disorder (HCC)    neuropathy  . Neuropathy ([place])   . OSA on CPAP   . PE (pulmonary embolism) 03/2015  . Presence of permanent cardiac pacemaker   . Prostate cancer ([place])   . Sinoatrial node dysfunction (HCC)     Past Surgical History:  Procedure Laterality Date  . CARDIAC CATHETERIZATION N/A 08/27/2015   Procedure: Left Heart Cath and Cors/Grafts Angiography;  Surgeon: Belva Crome, MD;  Location: [facility];  Service: Cardiovascular;  Laterality: N/A;  . COLONOSCOPY N/A 08/26/2015   Procedure: COLONOSCOPY;  Surgeon: Clarene Essex, MD;  Location: [facility] ENDOSCOPY;  Service: Endoscopy;  Laterality: N/A;  . CORONARY ANGIOPLASTY WITH STENT PLACEMENT     s/p bare metal stent implant SVG-diagonal 11/23/05, s/p BM stent implantation SVG diagonal 10/22/06 (feeds  LAD), and 05/2010 with DES to left circumflex  . CORONARY ARTERY BYPASS GRAFT    . ENDARTERECTOMY    . ESOPHAGOGASTRODUODENOSCOPY (EGD) WITH PROPOFOL N/A 08/26/2015   Procedure: ESOPHAGOGASTRODUODENOSCOPY (EGD) WITH PROPOFOL;  Surgeon: Clarene Essex, MD;  Location: [facility] ENDOSCOPY;  Service: Endoscopy;  Laterality: N/A;  . EXPLORATORY LAPAROTOMY    . LUMBAR FUSION    . PACEMAKER INSERTION    . ROTATOR CUFF REPAIR      There were no vitals filed for this visit.      Subjective Assessment - 04/06/16 1328    Subjective Did have a fall on Saturday. Had rode his bike, was getting off to go to walker and bounced off a piece of furniture which caused him to loose his balance. Spouse was in kitchen with installation guys who were installing a new microwave. These guys assist him up. Pt denies any injury other than a scrape on his hip which his spouse put a bandage on.    Patient is accompained by: Family member   Pertinent History Likes to be called "Ed".   Precautions: pacemaker.  PMH significant for: HTN, HLD, A-Fib with pacemaker, OSA on CPAP, NSTEMI s/p CABG, CKD stage III, DVT LLE on chronic coumadin, peripheral neuropathy, anemia, prostate cancer, Non-Hodgkin's Lymphoma (inactive, but suspicion for reoccurrence at this time,per wife)   Diagnostic tests CT of head (01/12/15): age-related atrophy and chronic microvascular ischemic disease.   Patient Stated Goals for patient to be able  to go to the bathroom on his own; to be able to get up off the floor after fall; to stand up from toilet without needing physical assistance from wife   Currently in Pain? No/denies   Pain Score 0-No pain             [facility] Adult PT Treatment/Exercise - 04/06/16 1332      Transfers   Transfers Sit to Stand;Stand to Sit   Sit to Stand 5: Supervision;With upper extremity assist;From bed;From chair/3-in-1   Sit to Stand Details Verbal cues for precautions/safety;Verbal cues for sequencing;Verbal cues for safe use of  DME/AE   Sit to Stand Details (indicate cue type and reason) pt continues to need cues for safe hand placement and to scoot forward for increased ease with standing   Stand to Sit 4: Min guard;With upper extremity assist;To chair/3-in-1;To bed;With armrests;Uncontrolled descent   Stand to Sit Details (indicate cue type and reason) Verbal cues for technique;Verbal cues for precautions/safety;Verbal cues for safe use of DME/AE   Stand to Sit Details cues to fully align with surface prior to sitting, increased cues needed when pt is fatigued.    Number of Reps 10 reps     Ambulation/Gait   Ambulation/Gait Yes   Ambulation/Gait Assistance 5: Supervision   Ambulation/Gait Assistance Details cues on posture and walker position with gait. cues for increased bil step length with gait.   Ambulation Distance (Feet) 330 Feet  x1, 220 x1   Assistive device Rolling walker;Other (Comment)  with 5# weight x 2   Gait Pattern Step-through pattern;Decreased stride length;Decreased hip/knee flexion - right;Decreased hip/knee flexion - left;Decreased dorsiflexion - right;Decreased dorsiflexion - left;Right foot flat;Left foot flat;Right flexed knee in stance;Left flexed knee in stance;Trunk flexed;Wide base of support   Ambulation Surface Level;Indoor     Self-Care   Self-Care Other Self-Care Comments   Other Self-Care Comments  reinforced that spouse should be with pt at all times for fall prevention safety. discussed pt not riding bike alone and possibly getting a bell/buzzer/something he can alert her with when he needs to get up and she is not in room. both verbally understood education provided.                                        PT Short Term Goals - 04/06/16 1332      PT SHORT TERM GOAL #1   Title Pt will perform HEP with assist of wife to maximize functional gains made in PT.  (Target date: 03/30/16)   Baseline Met 9/6.   Status Achieved     PT SHORT TERM GOAL #2   Title Complete Berg and  improve score by 5 points from baseline to indicate improved standing balance.  (03/30/16)   Baseline Baseline Berg score = 17/56;  9/8: Merrilee Jansky = 18/56   Status Partially Met     PT SHORT TERM GOAL #3   Title Pt will improve TUG time from 26.15 seconds to </= 21 seconds to indicate increased efficiency of mobility. (03/30/16)   Baseline 9/8: TUG = 31 seconds with RW   Status Not Met     PT SHORT TERM GOAL #4   Title Pt will ambulate x200' over level, indoor surfaces with mod I using LRAD to indicate increased safety with household mobility.  (03/30/16)   Baseline 04/06/16: pt able to meet distance, however not at [facility]  I level   Status Partially Met     PT SHORT TERM GOAL #5   Title Pt will consistently perform sit <> stand from standard chair with mod I, no cueing to indicate increased safety with functional transfers. (03/30/16)   Baseline 04/06/16: pt  continues to need cues for safety with transfers.    Status Not Met     PT SHORT TERM GOAL #6   Title Pt will consistently perform supine <> sit with mod I, no cueing to indicate increased independence with bed mobility. (03/30/16)   Baseline Met 9/8.   Status Achieved           PT Long Term Goals - 04/03/16 2035      PT LONG TERM GOAL #1   Title Pt will improve Berg score from 17 to 22/56 to indicate improved functional standing balance.  (Target date: 04/27/16)   Baseline 9/8: REVISED due to pt progressing more slowly than anticipated.   Status Revised     PT LONG TERM GOAL #2   Title Pt will improve TUG time from 26.15 seconds to < /= 16 seconds to indicate increased efficiency of mobility. (04/27/16)   Baseline 9/8: Deferred due to addition of weights to RW.    Status Deferred     PT LONG TERM GOAL #3   Title Pt will improve gait velocity from 1.9 ft/sec to >/= 1.31 ft/sec to indicate pt status of community ambulator.  (109/2/17)   Status On-going     PT LONG TERM GOAL #4   Title Pt will negotiate standard ramp and curb step with  supervision using LRAD to indicate increased safety transversing community obstacles. (04/27/16)   Status On-going     PT LONG TERM GOAL #5   Title Pw will ambulate > 500' over unlevel, paved surfaces with supervision using LRAD to indicate increased activity tolerance, independence with community mobility.  (04/27/16)   Status On-going     Additional Long Term Goals   Additional Long Term Goals Yes     PT LONG TERM GOAL #6   Title Pt will transfer from supine on floor to seated on EOM using standard chair/mat with min A to indicate increased safety with fall recovery.  (04/27/16)   Baseline 9/8: Deferred due to safety concerns, significant weakness.   Status Deferred     PT LONG TERM GOAL #7   Title Pt will sustain no falls for 3 consecutive weeks to indicate increased safety with functional mobility.  (04/27/16)   Status New            Plan - 04/06/16 1332    Clinical Impression Statement Remaining STGs checked this session with 1 partially met and the other one not met. Reinforced need of pt to have 24 hour supervision for safety and fall prevention due to another fall. Pt continues to need cues for safety with mobility, especially transfers and RW use. Pt should benefit from continued PT to progress toward unmet goals.              Rehab Potential Good   Clinical Impairments Affecting Rehab Potential Positive: very supportive wife; negative: complex PMH, ST memory impairments   PT Frequency 2x / week   PT Duration 8 weeks   PT Treatment/Interventions ADLs/Self Care Home Management;DME Instruction;Gait training;Stair training;Functional mobility training;Therapeutic activities;Patient/family education;Neuromuscular re-education;Balance training;Therapeutic exercise;Vestibular   PT Next Visit Plan  Continue functional strengthening endurance, and balance training. Consider adding static standing with EC, wide BOS (in  corner) to HEP.  When ~2 weeks left in POC, begin prepping pt/wife of  DC by beginning fitness regimen Benjie Karvonen can call to see if [employer] near pt's home has [employer])   Consulted and Agree with Plan of Care Patient;Family member/caregiver   Family Member Consulted wife, [name]      Patient will benefit from skilled therapeutic intervention in order to improve the following deficits and impairments:  Decreased balance, Decreased mobility, Abnormal gait, Decreased activity tolerance, Decreased strength, Decreased knowledge of use of DME, Postural dysfunction, Increased edema, Impaired sensation, Decreased endurance  Visit Diagnosis: Other abnormalities of gait and mobility  Muscle weakness (generalized)  Unsteadiness on feet  Other disturbances of skin sensation     Problem List Patient Active Problem List   Diagnosis Date Noted  . 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 Gait instability 03/06/2016  . Palliative care encounter   . Weakness generalized   . Supratherapeutic INR 01/13/2016  . Encephalopathy acute 01/13/2016  . Falls 01/12/2016  . Failure to thrive in adult 01/12/2016  . Retroperitoneal mass 01/12/2016  . Hydronephrosis 01/12/2016  . AKI (acute kidney injury) ([place]) 01/12/2016  . Weakness 01/12/2016  . Dehydration 12/19/2015  . Long term current use of anticoagulant therapy 12/19/2015  . Hypokalemia 12/19/2015  . Peripheral edema 12/19/2015  . Rectal bleeding 12/19/2015  . Prostate cancer ([place]) 12/19/2015  . Chronic diastolic heart failure ([place]) 08/24/2015  . Pain in the chest   . NSTEMI (non-ST elevated myocardial infarction) ([place])   . Iron deficiency anemia due to chronic blood loss   . Chest pain 08/23/2015  . Encounter for chemotherapy management 05/06/2015  . Pulmonary embolus ([place]) 04/01/2015  . CKD (chronic kidney disease), stage III 04/01/2015  . Chronic anemia 04/01/2015  . Acute respiratory failure with hypoxia ([place]) 04/01/2015  . Sinus node dysfunction chronotropic incompetence 08/23/2013  . Cardiac pacemaker -[employer]  08/23/2013  . Atrial fibrillation ([place]) 08/23/2013  . Fall 07/11/2013  . Dyspnea 07/10/2013  . OSA on CPAP 07/10/2013  . Peripheral vascular disease ([place]) 06/07/2013  . Atherosclerosis of coronary artery bypass graft of native heart without angina pectoris 06/07/2013  . Hyperlipidemia 06/07/2013  . Essential hypertension, benign 06/07/2013  . Other malignant lymphomas, unspecified site, extranodal and solid organ sites 07/03/2011    Willow Ora, PTA, [facility] [address] [place], [place] [address] [phone] 04/07/16, 10:16 AM   Name: Edward Woodward MRN: [id number] Date of Birth: 1927/01/12

## 2016-04-08 ENCOUNTER — Other Ambulatory Visit: Payer: Self-pay | Admitting: Urology

## 2016-04-08 NOTE — Progress Notes (Signed)
Patient has preop on 04/09/2016 at 1130am.  Surgery on 04/10/2016. Needs orders in EPIC . Thank You!

## 2016-04-09 ENCOUNTER — Ambulatory Visit: Payer: Medicare Other | Admitting: Physical Therapy

## 2016-04-09 ENCOUNTER — Encounter (HOSPITAL_COMMUNITY): Payer: Self-pay

## 2016-04-09 ENCOUNTER — Encounter (HOSPITAL_COMMUNITY)
Admission: RE | Admit: 2016-04-09 | Discharge: 2016-04-09 | Disposition: A | Discharge status: Home / Self Care | Payer: Medicare Other | Source: Ambulatory Visit | Attending: Urology | Admitting: Urology

## 2016-04-09 DIAGNOSIS — I252 Old myocardial infarction: Secondary | ICD-10-CM | POA: Diagnosis not present

## 2016-04-09 DIAGNOSIS — N3289 Other specified disorders of bladder: Secondary | ICD-10-CM | POA: Diagnosis not present

## 2016-04-09 DIAGNOSIS — C61 Malignant neoplasm of prostate: Secondary | ICD-10-CM | POA: Diagnosis not present

## 2016-04-09 DIAGNOSIS — E7800 Pure hypercholesterolemia, unspecified: Secondary | ICD-10-CM | POA: Diagnosis not present

## 2016-04-09 DIAGNOSIS — Z9049 Acquired absence of other specified parts of digestive tract: Secondary | ICD-10-CM | POA: Diagnosis not present

## 2016-04-09 DIAGNOSIS — Z7952 Long term (current) use of systemic steroids: Secondary | ICD-10-CM | POA: Diagnosis not present

## 2016-04-09 DIAGNOSIS — I2510 Atherosclerotic heart disease of native coronary artery without angina pectoris: Secondary | ICD-10-CM | POA: Diagnosis not present

## 2016-04-09 DIAGNOSIS — Z86711 Personal history of pulmonary embolism: Secondary | ICD-10-CM | POA: Diagnosis not present

## 2016-04-09 DIAGNOSIS — I110 Hypertensive heart disease with heart failure: Secondary | ICD-10-CM | POA: Diagnosis not present

## 2016-04-09 DIAGNOSIS — D696 Thrombocytopenia, unspecified: Secondary | ICD-10-CM | POA: Diagnosis not present

## 2016-04-09 DIAGNOSIS — Z950 Presence of cardiac pacemaker: Secondary | ICD-10-CM | POA: Diagnosis not present

## 2016-04-09 DIAGNOSIS — I509 Heart failure, unspecified: Secondary | ICD-10-CM | POA: Diagnosis not present

## 2016-04-09 DIAGNOSIS — F329 Major depressive disorder, single episode, unspecified: Secondary | ICD-10-CM | POA: Diagnosis not present

## 2016-04-09 DIAGNOSIS — Z951 Presence of aortocoronary bypass graft: Secondary | ICD-10-CM | POA: Diagnosis not present

## 2016-04-09 DIAGNOSIS — Z79899 Other long term (current) drug therapy: Secondary | ICD-10-CM | POA: Diagnosis not present

## 2016-04-09 DIAGNOSIS — Z7901 Long term (current) use of anticoagulants: Secondary | ICD-10-CM | POA: Diagnosis not present

## 2016-04-09 DIAGNOSIS — Z8572 Personal history of non-Hodgkin lymphomas: Secondary | ICD-10-CM | POA: Diagnosis not present

## 2016-04-09 DIAGNOSIS — G4733 Obstructive sleep apnea (adult) (pediatric): Secondary | ICD-10-CM | POA: Diagnosis not present

## 2016-04-09 DIAGNOSIS — Z87891 Personal history of nicotine dependence: Secondary | ICD-10-CM | POA: Diagnosis not present

## 2016-04-09 DIAGNOSIS — H3530 Unspecified macular degeneration: Secondary | ICD-10-CM | POA: Diagnosis not present

## 2016-04-09 DIAGNOSIS — Z955 Presence of coronary angioplasty implant and graft: Secondary | ICD-10-CM | POA: Diagnosis not present

## 2016-04-09 DIAGNOSIS — R591 Generalized enlarged lymph nodes: Secondary | ICD-10-CM | POA: Diagnosis not present

## 2016-04-09 DIAGNOSIS — N131 Hydronephrosis with ureteral stricture, not elsewhere classified: Secondary | ICD-10-CM | POA: Diagnosis not present

## 2016-04-09 HISTORY — DX: Unspecified urinary incontinence: R32

## 2016-04-09 HISTORY — DX: Repeated falls: R29.6

## 2016-04-09 HISTORY — DX: Acute myocardial infarction, unspecified: I21.9

## 2016-04-09 HISTORY — DX: Angina pectoris, unspecified: I20.9

## 2016-04-09 HISTORY — DX: Non-pressure chronic ulcer of unspecified part of left lower leg with unspecified severity: L97.929

## 2016-04-09 HISTORY — DX: Personal history of other diseases of the digestive system: Z87.19

## 2016-04-09 HISTORY — DX: Unspecified macular degeneration: H35.30

## 2016-04-09 HISTORY — DX: Non-Hodgkin lymphoma, unspecified, unspecified site: C85.90

## 2016-04-09 HISTORY — DX: Localized edema: R60.0

## 2016-04-09 HISTORY — DX: Reserved for inherently not codable concepts without codable children: IMO0001

## 2016-04-09 HISTORY — DX: Heart failure, unspecified: I50.9

## 2016-04-09 HISTORY — DX: Anemia, unspecified: D64.9

## 2016-04-09 HISTORY — DX: Other pulmonary collapse: J98.19

## 2016-04-09 HISTORY — DX: Personal history of other medical treatment: Z92.89

## 2016-04-09 HISTORY — DX: Cardiac arrhythmia, unspecified: I49.9

## 2016-04-09 HISTORY — DX: Non-pressure chronic ulcer of unspecified part of left lower leg with unspecified severity: L97.919

## 2016-04-09 LAB — CBC
HEMATOCRIT: 31.3 % — AB (ref 39.0–52.0)
HEMOGLOBIN: 10.3 g/dL — AB (ref 13.0–17.0)
MCH: 29.1 pg (ref 26.0–34.0)
MCHC: 32.9 g/dL (ref 30.0–36.0)
MCV: 88.4 fL (ref 78.0–100.0)
Platelets: 125 10*3/uL — ABNORMAL LOW (ref 150–400)
RBC: 3.54 MIL/uL — ABNORMAL LOW (ref 4.22–5.81)
RDW: 14.3 % (ref 11.5–15.5)
WBC: 7.5 10*3/uL (ref 4.0–10.5)

## 2016-04-09 LAB — BASIC METABOLIC PANEL
ANION GAP: 9 (ref 5–15)
BUN: 39 mg/dL — ABNORMAL HIGH (ref 6–20)
CALCIUM: 10.5 mg/dL — AB (ref 8.9–10.3)
CHLORIDE: 104 mmol/L (ref 101–111)
CO2: 27 mmol/L (ref 22–32)
Creatinine, Ser: 2.25 mg/dL — ABNORMAL HIGH (ref 0.61–1.24)
GFR calc Af Amer: 29 mL/min — ABNORMAL LOW (ref >60)
GFR calc non Af Amer: 25 mL/min — ABNORMAL LOW (ref >60)
GLUCOSE: 123 mg/dL — AB (ref 65–99)
POTASSIUM: 4.2 mmol/L (ref 3.5–5.1)
Sodium: 140 mmol/L (ref 135–145)

## 2016-04-09 NOTE — Progress Notes (Signed)
CBC, BMP results in epic per PAT visit 04/09/2016 sent to Dr Junious Silk

## 2016-04-09 NOTE — Patient Instructions (Signed)
Edward Woodward  04/09/2016   Your procedure is scheduled on: Friday April 10, 2016  Report to [facility] Main  Entrance take [place]  elevators to 3rd floor to  [place] at 5:30 AM.  Call this number if you have problems the morning of surgery [phone]   Remember: ONLY 1 PERSON MAY GO WITH YOU TO SHORT STAY TO GET  READY MORNING OF [place].  Do not eat food or drink liquids :After Midnight.     Take these medicines the morning of surgery with A SIP OF WATER: Abiraterone (Zytiga); Prednisone; Clonazepam (Klonopin); May use Flonase Nasal Spray if needed                                 You may not have any metal on your body including hair pins and              piercings  Do not wear jewelry, lotions, powders or colognes, deodorant                        Men may shave face and neck.   Do not bring valuables to the hospital. [place].  Contacts, dentures or bridgework may not be worn into surgery.     Patients discharged the day of surgery will not be allowed to drive home.  Name and phone number of your driver:Janice Eberl (wife)  _____________________________________________________________________             [facility] - Preparing for Surgery Before surgery, you can play an important role.  Because skin is not sterile, your skin needs to be as free of germs as possible.  You can reduce the number of germs on your skin by washing with CHG (chlorahexidine gluconate) soap before surgery.  CHG is an antiseptic cleaner which kills germs and bonds with the skin to continue killing germs even after washing. Please DO NOT use if you have an allergy to CHG or antibacterial soaps.  If your skin becomes reddened/irritated stop using the CHG and inform your nurse when you arrive at Short Stay. Do not shave (including legs and underarms) for at least 48 hours prior to the first CHG shower.  You may  shave your face/neck. Please follow these instructions carefully:  1.  Shower with CHG Soap the night before surgery and the  morning of Surgery.  2.  If you choose to wash your hair, wash your hair first as usual with your  normal  shampoo.  3.  After you shampoo, rinse your hair and body thoroughly to remove the  shampoo.                           4.  Use CHG as you would any other liquid soap.  You can apply chg directly  to the skin and wash                       Gently with a scrungie or clean washcloth.  5.  Apply the CHG Soap to your body ONLY FROM THE NECK DOWN.   Do not use on face/ open  Wound or open sores. Avoid contact with eyes, ears mouth and genitals (private parts).                       Wash face,  Genitals (private parts) with your normal soap.             6.  Wash thoroughly, paying special attention to the area where your surgery  will be performed.  7.  Thoroughly rinse your body with warm water from the neck down.  8.  DO NOT shower/wash with your normal soap after using and rinsing off  the CHG Soap.                9.  Pat yourself dry with a clean towel.            10.  Wear clean pajamas.            11.  Place clean sheets on your bed the night of your first shower and do not  sleep with pets. Day of Surgery : Do not apply any lotions/deodorants the morning of surgery.  Please wear clean clothes to the hospital/surgery center.  FAILURE TO FOLLOW THESE INSTRUCTIONS MAY RESULT IN THE CANCELLATION OF YOUR SURGERY PATIENT SIGNATURE_________________________________  NURSE SIGNATURE__________________________________  ________________________________________________________________________

## 2016-04-09 NOTE — Progress Notes (Signed)
Spoke with Dr Hubbard Robinson in regards to pts H&P. No orders given. Anesthesia to see pt day of surgery.

## 2016-04-10 ENCOUNTER — Ambulatory Visit (HOSPITAL_COMMUNITY): Payer: Medicare Other | Admitting: Anesthesiology

## 2016-04-10 ENCOUNTER — Ambulatory Visit (HOSPITAL_COMMUNITY)
Admission: RE | Admit: 2016-04-10 | Discharge: 2016-04-10 | Disposition: A | Discharge status: Home / Self Care | Payer: Medicare Other | Source: Ambulatory Visit | Attending: Urology | Admitting: Urology

## 2016-04-10 ENCOUNTER — Encounter (HOSPITAL_COMMUNITY): Payer: Self-pay | Admitting: *Deleted

## 2016-04-10 ENCOUNTER — Encounter (HOSPITAL_COMMUNITY)
Admission: RE | Disposition: A | Discharge status: Home / Self Care | Payer: Self-pay | Source: Ambulatory Visit | Attending: Urology

## 2016-04-10 DIAGNOSIS — Z7952 Long term (current) use of systemic steroids: Secondary | ICD-10-CM | POA: Insufficient documentation

## 2016-04-10 DIAGNOSIS — Z955 Presence of coronary angioplasty implant and graft: Secondary | ICD-10-CM | POA: Insufficient documentation

## 2016-04-10 DIAGNOSIS — I110 Hypertensive heart disease with heart failure: Secondary | ICD-10-CM | POA: Diagnosis not present

## 2016-04-10 DIAGNOSIS — N131 Hydronephrosis with ureteral stricture, not elsewhere classified: Secondary | ICD-10-CM | POA: Diagnosis not present

## 2016-04-10 DIAGNOSIS — Z951 Presence of aortocoronary bypass graft: Secondary | ICD-10-CM | POA: Insufficient documentation

## 2016-04-10 DIAGNOSIS — I2510 Atherosclerotic heart disease of native coronary artery without angina pectoris: Secondary | ICD-10-CM | POA: Diagnosis not present

## 2016-04-10 DIAGNOSIS — N3289 Other specified disorders of bladder: Secondary | ICD-10-CM | POA: Insufficient documentation

## 2016-04-10 DIAGNOSIS — Z79899 Other long term (current) drug therapy: Secondary | ICD-10-CM | POA: Insufficient documentation

## 2016-04-10 DIAGNOSIS — E7800 Pure hypercholesterolemia, unspecified: Secondary | ICD-10-CM | POA: Insufficient documentation

## 2016-04-10 DIAGNOSIS — C61 Malignant neoplasm of prostate: Secondary | ICD-10-CM | POA: Diagnosis not present

## 2016-04-10 DIAGNOSIS — I509 Heart failure, unspecified: Secondary | ICD-10-CM | POA: Insufficient documentation

## 2016-04-10 DIAGNOSIS — Z86711 Personal history of pulmonary embolism: Secondary | ICD-10-CM | POA: Insufficient documentation

## 2016-04-10 DIAGNOSIS — Z87891 Personal history of nicotine dependence: Secondary | ICD-10-CM | POA: Insufficient documentation

## 2016-04-10 DIAGNOSIS — R591 Generalized enlarged lymph nodes: Secondary | ICD-10-CM | POA: Insufficient documentation

## 2016-04-10 DIAGNOSIS — F329 Major depressive disorder, single episode, unspecified: Secondary | ICD-10-CM | POA: Insufficient documentation

## 2016-04-10 DIAGNOSIS — Z7901 Long term (current) use of anticoagulants: Secondary | ICD-10-CM | POA: Insufficient documentation

## 2016-04-10 DIAGNOSIS — N1339 Other hydronephrosis: Secondary | ICD-10-CM

## 2016-04-10 DIAGNOSIS — G4733 Obstructive sleep apnea (adult) (pediatric): Secondary | ICD-10-CM | POA: Insufficient documentation

## 2016-04-10 DIAGNOSIS — Z8572 Personal history of non-Hodgkin lymphomas: Secondary | ICD-10-CM | POA: Insufficient documentation

## 2016-04-10 DIAGNOSIS — H3530 Unspecified macular degeneration: Secondary | ICD-10-CM | POA: Insufficient documentation

## 2016-04-10 DIAGNOSIS — I10 Essential (primary) hypertension: Secondary | ICD-10-CM | POA: Diagnosis not present

## 2016-04-10 DIAGNOSIS — D696 Thrombocytopenia, unspecified: Secondary | ICD-10-CM | POA: Diagnosis not present

## 2016-04-10 DIAGNOSIS — I252 Old myocardial infarction: Secondary | ICD-10-CM | POA: Insufficient documentation

## 2016-04-10 DIAGNOSIS — Z950 Presence of cardiac pacemaker: Secondary | ICD-10-CM | POA: Insufficient documentation

## 2016-04-10 DIAGNOSIS — Z9049 Acquired absence of other specified parts of digestive tract: Secondary | ICD-10-CM | POA: Insufficient documentation

## 2016-04-10 DIAGNOSIS — N1330 Unspecified hydronephrosis: Secondary | ICD-10-CM | POA: Diagnosis not present

## 2016-04-10 HISTORY — PX: CYSTOSCOPY W/ URETERAL STENT PLACEMENT: SHX1429

## 2016-04-10 LAB — PROTIME-INR
INR: 1.83
Prothrombin Time: 21.4 seconds — ABNORMAL HIGH (ref 11.4–15.2)

## 2016-04-10 LAB — APTT: APTT: 31 s (ref 24–36)

## 2016-04-10 SURGERY — CYSTOSCOPY, WITH RETROGRADE PYELOGRAM AND URETERAL STENT INSERTION
Anesthesia: Monitor Anesthesia Care | Laterality: Bilateral

## 2016-04-10 MED ORDER — STERILE WATER FOR IRRIGATION IR SOLN
Status: DC | PRN
  Administered 2016-04-10: 3000 mL

## 2016-04-10 MED ORDER — LIDOCAINE HCL (CARDIAC) 20 MG/ML IV SOLN
INTRAVENOUS | Status: DC | PRN
  Administered 2016-04-10: 30 mg via INTRAVENOUS

## 2016-04-10 MED ORDER — FENTANYL CITRATE (PF) 100 MCG/2ML IJ SOLN
INTRAMUSCULAR | Status: AC
  Filled 2016-04-10: qty 2

## 2016-04-10 MED ORDER — LEVOFLOXACIN IN D5W 500 MG/100ML IV SOLN
500.0000 mg | INTRAVENOUS | Status: AC
  Administered 2016-04-10: 500 mg via INTRAVENOUS
  Filled 2016-04-10: qty 100

## 2016-04-10 MED ORDER — ONDANSETRON HCL 4 MG/2ML IJ SOLN
INTRAMUSCULAR | Status: AC
  Filled 2016-04-10: qty 2

## 2016-04-10 MED ORDER — PROPOFOL 10 MG/ML IV BOLUS
INTRAVENOUS | Status: AC
  Filled 2016-04-10: qty 20

## 2016-04-10 MED ORDER — FENTANYL CITRATE (PF) 100 MCG/2ML IJ SOLN
25.0000 ug | INTRAMUSCULAR | Status: DC | PRN
  Administered 2016-04-10: 25 ug via INTRAVENOUS

## 2016-04-10 MED ORDER — LIDOCAINE HCL 2 % EX GEL
CUTANEOUS | Status: AC
  Filled 2016-04-10: qty 5

## 2016-04-10 MED ORDER — PROPOFOL 500 MG/50ML IV EMUL
INTRAVENOUS | Status: DC | PRN
  Administered 2016-04-10: 50 ug/kg/min via INTRAVENOUS

## 2016-04-10 MED ORDER — LIDOCAINE HCL 2 % EX GEL
CUTANEOUS | Status: DC | PRN
  Administered 2016-04-10: 1 via URETHRAL

## 2016-04-10 MED ORDER — PROPOFOL 500 MG/50ML IV EMUL
INTRAVENOUS | Status: DC | PRN
  Administered 2016-04-10 (×2): 10 mg via INTRAVENOUS
  Administered 2016-04-10: 20 mg via INTRAVENOUS
  Administered 2016-04-10: 30 mg via INTRAVENOUS
  Administered 2016-04-10: 20 mg via INTRAVENOUS

## 2016-04-10 MED ORDER — LACTATED RINGERS IV SOLN
INTRAVENOUS | Status: DC | PRN
  Administered 2016-04-10: 07:00:00 via INTRAVENOUS

## 2016-04-10 MED ORDER — MEPERIDINE HCL 50 MG/ML IJ SOLN: 6.2500 mg | INTRAMUSCULAR | Status: DC | PRN

## 2016-04-10 MED ORDER — METOCLOPRAMIDE HCL 5 MG/ML IJ SOLN: 10.0000 mg | Freq: Once | INTRAMUSCULAR | Status: DC | PRN

## 2016-04-10 MED ORDER — LIDOCAINE 2% (20 MG/ML) 5 ML SYRINGE
INTRAMUSCULAR | Status: AC
  Filled 2016-04-10: qty 5

## 2016-04-10 SURGICAL SUPPLY — 17 items
BAG URO CATCHER STRL LF (MISCELLANEOUS) ×3 IMPLANT
BASKET ZERO TIP NITINOL 2.4FR (BASKET) IMPLANT
BSKT STON RTRVL ZERO TP 2.4FR (BASKET)
CATH INTERMIT  6FR 70CM (CATHETERS) ×2 IMPLANT
CATH URET 5FR 28IN CONE TIP (BALLOONS) ×2
CATH URET 5FR 70CM CONE TIP (BALLOONS) IMPLANT
CLOTH BEACON ORANGE TIMEOUT ST (SAFETY) ×3 IMPLANT
GLOVE BIOGEL M STRL SZ7.5 (GLOVE) ×3 IMPLANT
GOWN STRL REUS W/TWL LRG LVL3 (GOWN DISPOSABLE) ×5 IMPLANT
GOWN STRL REUS W/TWL XL LVL3 (GOWN DISPOSABLE) ×1 IMPLANT
GUIDEWIRE ANG ZIPWIRE 038X150 (WIRE) ×2 IMPLANT
GUIDEWIRE STR DUAL SENSOR (WIRE) ×3 IMPLANT
MANIFOLD NEPTUNE II (INSTRUMENTS) ×3 IMPLANT
PACK CYSTO (CUSTOM PROCEDURE TRAY) ×3 IMPLANT
STENT CONTOUR 6FRX26X.038 (STENTS) ×4 IMPLANT
TUBING CONNECTING 10 (TUBING) ×2 IMPLANT
TUBING CONNECTING 10' (TUBING) ×1

## 2016-04-10 NOTE — Anesthesia Preprocedure Evaluation (Addendum)
Anesthesia Evaluation  Patient identified by MRN, date of birth, ID band Patient awake    Reviewed: Allergy & Precautions, NPO status , Patient's Chart, lab work & pertinent test results  History of Anesthesia Complications (+) history of anesthetic complications  Airway Mallampati: II  TM Distance: >3 FB Neck ROM: Full    Dental no notable dental hx. (+) Edentulous Upper, Edentulous Lower   Pulmonary former smoker, PE (2016)   Pulmonary exam normal breath sounds clear to auscultation       Cardiovascular hypertension, Pt. on medications + CAD, + Cardiac Stents (1985), + CABG and +CHF  Normal cardiovascular exam+ pacemaker  Rhythm:Regular Rate:Normal     Neuro/Psych negative neurological ROS  negative psych ROS   GI/Hepatic Neg liver ROS, hiatal hernia, GERD  ,  Endo/Other  negative endocrine ROS  Renal/GU negative Renal ROS  negative genitourinary   Musculoskeletal negative musculoskeletal ROS (+)   Abdominal   Peds negative pediatric ROS (+)  Hematology negative hematology ROS (+) thrombocytopenia   Anesthesia Other Findings On coumadin. Non-hodgkin's lymphoma  Reproductive/Obstetrics negative OB ROS                           Anesthesia Physical Anesthesia Plan  ASA: IV  Anesthesia Plan: General and MAC   Post-op Pain Management:    Induction: Intravenous  Airway Management Planned:   Additional Equipment:   Intra-op Plan:   Post-operative Plan:   Informed Consent: I have reviewed the patients History and Physical, chart, labs and discussed the procedure including the risks, benefits and alternatives for the proposed anesthesia with the patient or authorized representative who has indicated his/her understanding and acceptance.   Dental advisory given  Plan Discussed with: CRNA  Anesthesia Plan Comments: (Multiple co-morbidities. High risk for compilations. Needs  tight BP control)        Anesthesia Quick Evaluation

## 2016-04-10 NOTE — Transfer of Care (Signed)
Immediate Anesthesia Transfer of Care Note  Patient: Edward Woodward  Procedure(s) Performed: Procedure(s): CYSTOSCOPY WITH RETROGRADE PYELOGRAM/URETERAL STENT PLACEMENT (Bilateral)  Patient Location: PACU  Anesthesia Type:MAC  Level of Consciousness: awake and patient cooperative  Airway & Oxygen Therapy: Patient Spontanous Breathing and Patient connected to face mask oxygen  Post-op Assessment: Report given to RN and Post -op Vital signs reviewed and stable  Post vital signs: Reviewed and stable  Last Vitals:  Vitals:   04/10/16 0527  BP: (!) 155/92  Pulse: 65  Resp: 18  Temp: 36.6 C    Last Pain:  Vitals:   04/10/16 0527  TempSrc: Oral      Patients Stated Pain Goal: 2 ([id number])  Complications: No apparent anesthesia complications

## 2016-04-10 NOTE — Progress Notes (Signed)
Pt has reddned  scab noted to right knee. No drainage noted. tegaderm applied to knee.

## 2016-04-10 NOTE — Anesthesia Postprocedure Evaluation (Signed)
Anesthesia Post Note  Patient: Edward Woodward  Procedure(s) Performed: Procedure(s) (LRB): CYSTOSCOPY WITH RETROGRADE PYELOGRAM/URETERAL STENT PLACEMENT (Bilateral)  Patient location during evaluation: PACU Anesthesia Type: MAC Level of consciousness: awake and alert Pain management: pain level controlled Vital Signs Assessment: post-procedure vital signs reviewed and stable Respiratory status: spontaneous breathing, nonlabored ventilation, respiratory function stable and patient connected to nasal cannula oxygen Cardiovascular status: stable and blood pressure returned to baseline Anesthetic complications: no    Last Vitals:  Vitals:   04/10/16 0954 04/10/16 1025  BP: (!) 130/51 (!) 149/63  Pulse: 65 65  Resp: 16 18  Temp: 36.6 C 36.7 C    Last Pain:  Vitals:   04/10/16 1025  TempSrc: Oral  PainSc:                  Montez Hageman

## 2016-04-10 NOTE — Op Note (Signed)
Preoperative diagnosis: Bilateral hydronephrosis, prostate cancer, retroperitoneal fibrosis/lymphadenopathy Postoperative diagnosis: Same  Procedure: Exam under anesthesia, bilateral retrograde pyelograms, bilateral ureteral stent placement  Surgeon: Junious Silk  Anesthesia: MAC  Indication for procedure: 82 year old with prostate cancer, h/o NHL with progressive retroperitoneal process/LAD causing bilateral hydronephrosis. Dr. Gaynelle Arabian saw patient in consult and the decision was made to hold off on stents. His kidney function continues to decline and patient and family elected ureteral stent placement after further consult with Onc and Neph. Dr. Gaynelle Arabian was unavailable to place the stents this week.     Findings: On exam under anesthesia the penis was uncircumcised with a normal foreskin. Meatus appeared normal but was tight. The penis had no mass or lesion. The testicles were descended bilaterally and palpably normal. On digital rectal exam the prostate was about 40 g and palpably normal. Landmarks were preserved and I didn't appreciate any hard area or nodules.  Cystoscopy revealed some narrowing of the fossa navicularis. The urethra was unremarkable. The prostatic urethra was nonobstructing. The bladder contained no stone or foreign body. The trigone and ureteral orifices were in their normal orthotopic position although the ureteral orifices were slightly lateral. There was moderate trabeculation of the bladder and potentially decrease capacity.  Left retrograde pyelogram-this outlined a single ureter single collecting system unit. The ureter was slightly J hooked and then contrast filled a narrow ureter which seem to be compressed all the way up the mid and proximal ureter externally which drained into a mod - severely dilated renal pelvis and collecting system.  Right retrograde pyelogram-this outlined a single ureter single collecting system unit with a narrow distal and mid ureter  consistent with external compression, the ureter was mildly dilated in the proximal ureter and then drained into a moderately to severely dilated renal pelvis and collecting system.  Description of procedure: After consent was obtained patient brought to the operating room. After adequate anesthesia he was placed in lithotomy position. An exam under anesthesia was performed. He was prepped and draped in the usual sterile fashion. A timeout was performed to confirm the patient and procedure. The meatus is quite tight and lidocaine jelly was instilled per urethra. I dilated the urethral meatus to 22 [place]. This allowed the 21 French cystoscope to advance without difficulty. The cystoscope was passed per urethra and the bladder inspected. The left ureteral orifice was too narrow to be cannulated with a 6 [place] open-ended catheter therefore it was cannulated with a cone-tipped catheter and retrograde injection of contrast was performed. The 6 [place] open-ended catheter was repassed and the UO was cannulated with a sensor wire the given the J hook I could not get the wire to engage passed a centimeter to before it spun out. Therefore a angled Glidewire was advanced and once it engaged the ureter for a few centimeters and was able to straighten out the J hooked distal ureter and the wire advanced normally into the collecting system. The 6 [place] open-ended catheter was passed proximally and the wire removed with a good hydronephrotic drip. The wire was replaced and the 6 [place] open-ended catheter removed a 6 x 26 cm stent was advanced and the wire removed. A good coil seen in the collecting system and a good coil in the bladder.  Attention was turned to the right side were it was cannulated with a cone-tipped catheter in the ureteral orifice and retrograde injection of contrast was performed. The Glidewire was advanced and coiled in the collecting system. The 6 [place] open-ended  catheter was advanced to dilate the  tract and the wire removed. A good hydronephrotic drip was noted. The wire was repassed. The 6 [place] open-ended catheter was removed but this pulled the wire out. The 6 [place] open-ended catheter was readvanced and the wire advanced up and coiled into the collecting system again. The 6 [place] open-ended catheter was removed and a 626 cm stent was advanced. The wire was removed with a good coil seen in the collecting system and a good coil in the bladder. The stents appeared to be draining contrast. The bladder was drained and the scope removed. The patient was awakened and taken to the recovery room in stable condition.  Complications: None Blood loss: None Specimens: None  Drains: Bilateral 6 x 26 cm ureteral stents

## 2016-04-10 NOTE — Progress Notes (Signed)
Informed OR of pt's PT  21.3

## 2016-04-10 NOTE — H&P (Signed)
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 He presents for ureteral stents after careful consult with GU, Onc, Neph and his family.   He voids with weak stream, urgency, and has UUI. He has frequency. He has no dysuria or gross hematuria.    Past Medical History:  Diagnosis Date  . AMD (age-related macular degeneration), bilateral   . Anemia   . Anginal pain ([place])    pts wife states pt had to use nitroglycerin this am 04/09/2016  . CAD (coronary artery disease)     NYHA Class 1B, CABG 1985  . CHF (congestive heart failure) ([place])   . Collagen vascular disease ([place])   . Collapsed lung    right   . Complication of anesthesia    pts wife states pt gets confused   . Degeneration of lumbar or lumbosacral intervertebral disc   . Depressive disorder, not elsewhere classified   . Dysrhythmia   . GERD (gastroesophageal reflux disease)   . History of blood transfusion   . History of hiatal hernia   . HTN (hypertension)   . Hypercholesteremia   . Lower leg edema    bilat  . Multiple falls   . Myocardial infarction ([place])   . Neuromuscular disorder (HCC)    neuropathy  . Neuropathy ([place])   . Non Hodgkin's lymphoma ([place])   . OSA on CPAP   . PE (pulmonary embolism) 03/2015  . Presence of permanent cardiac pacemaker   . Prostate cancer ([place])   . Shortness of breath dyspnea    with exertion   . Sinoatrial node dysfunction (HCC)   . Ulcers of both lower legs (HCC)    history of   . Urinary incontinence    Past Surgical History:  Procedure Laterality Date  . APPENDECTOMY    . CARDIAC CATHETERIZATION N/A 08/27/2015   Procedure: Left Heart Cath and Cors/Grafts Angiography;  Surgeon: Belva Crome, MD;  Location: [facility];  Service: Cardiovascular;  Laterality: N/A;  . COLONOSCOPY N/A  08/26/2015   Procedure: COLONOSCOPY;  Surgeon: Clarene Essex, MD;  Location: [facility] ENDOSCOPY;  Service: Endoscopy;  Laterality: N/A;  . CORONARY ANGIOPLASTY WITH STENT PLACEMENT     s/p bare metal stent implant SVG-diagonal 11/23/05, s/p BM stent implantation SVG diagonal 10/22/06 (feeds LAD), and 05/2010 with DES to left circumflex  . CORONARY ARTERY BYPASS GRAFT    . ENDARTERECTOMY     bilat   . ESOPHAGOGASTRODUODENOSCOPY (EGD) WITH PROPOFOL N/A 08/26/2015   Procedure: ESOPHAGOGASTRODUODENOSCOPY (EGD) WITH PROPOFOL;  Surgeon: Clarene Essex, MD;  Location[facility] ENDOSCOPY;  Service: Endoscopy;  Laterality: N/A;  . EXPLORATORY LAPAROTOMY    . LUMBAR FUSION    . PACEMAKER INSERTION    . ROTATOR CUFF REPAIR     bilat  . TONSILLECTOMY      Home Medications:  Prescriptions Prior to Admission  Medication Sig Dispense Refill Last Dose  . abiraterone Acetate (ZYTIGA) 250 MG tablet Take 4 tablets (1,000 mg total) by mouth daily. Take on an empty stomach 1 hour before or 2 hours after a meal 120 tablet 1 04/10/2016 at 04303  . acetaminophen (TYLENOL) 500 MG tablet Take 1,000 mg by mouth every 6 (six) hours as needed for mild pain.   Past Week at Unknown time  . atorvastatin (LIPITOR) 40  MG tablet Take 40 mg by mouth every other day.   Past Week at Unknown time  . benzocaine (ORAJEL) 10 % mucosal gel Use as directed in the mouth or throat 4 (four) times daily as needed for mouth pain. 5.3 g 0 Taking  . citalopram (CELEXA) 20 MG tablet Take 20 mg by mouth daily.     04/09/2016 at 2030  . clonazePAM (KLONOPIN) 0.25 MG disintegrating tablet Take 0.5 tablets (0.125 mg total) by mouth 2 (two) times daily. 30 tablet 1 04/10/2016 at 0430  . Cyanocobalamin (VITAMIN B-12 CR) 1000 MCG TBCR Take 1 tablet by mouth daily.   04/09/2016 at Unknown time  . fluticasone (FLONASE) 50 MCG/ACT nasal spray Place 1 spray into both nostrils daily. (Patient taking differently: Place 1 spray into both nostrils daily as needed. ) 16 g 0 Past  Month at Unknown time  . folic acid (FOLVITE) 1 MG tablet Take 1 tablet (1 mg total) by mouth daily. 30 tablet 5 Past Week at Unknown time  . isosorbide mononitrate (IMDUR) 30 MG 24 hr tablet Take 1 tablet (30 mg total) by mouth daily. 30 tablet 5 04/09/2016 at 2030  . Lutein-Zeaxanthin 25-5 MG CAPS Take 1 tablet by mouth daily.   04/09/2016 at Unknown time  . magnesium hydroxide (MILK OF MAGNESIA) 400 MG/5ML suspension Take 5 mLs by mouth daily as needed for mild constipation.    Past Week at Unknown time  . Melatonin 3 MG TABS Take 3 mg by mouth at bedtime.   04/09/2016 at Unknown time  . metoprolol succinate (TOPROL-XL) 50 MG 24 hr tablet Take 1 tablet (50 mg total) by mouth daily. Take with or immediately following a meal. 90 tablet 3 04/09/2016 at 2030  . nitroGLYCERIN (NITROLINGUAL) 0.4 MG/SPRAY spray Place 1 spray under the tongue every 5 (five) minutes x 3 doses as needed for chest pain. 12 g 3 04/09/2016 at Unknown time  . Polyethyl Glycol-Propyl Glycol (SYSTANE) 0.4-0.3 % SOLN Place 1 drop into both eyes at bedtime.    04/09/2016 at Unknown time  . predniSONE (DELTASONE) 5 MG tablet Take 1 tablet (5 mg total) by mouth daily. 30 tablet 0 04/10/2016 at 0430  . torsemide (DEMADEX) 20 MG tablet Take 20 mg by mouth 2 (two) times daily.   04/09/2016 at Unknown time  . warfarin (COUMADIN) 5 MG tablet Take one tablet (5mg ) PO every MWF. half tablet (2.5 mg)all other days. Or as directed by MD 30 tablet 1 04/09/2016 at 2030   Allergies:  Allergies  Allergen Reactions  . Penicillins Swelling    Has patient had a PCN reaction causing immediate rash, facial/tongue/throat swelling, SOB or lightheadedness with hypotension: Yes Has patient had a PCN reaction causing severe rash involving mucus membranes or skin necrosis: No Has patient had a PCN reaction that required hospitalization No Has patient had a PCN reaction occurring within the last 10 years: No If all of the above answers are "NO", then may proceed  with Cephalosporin use.   . Simvastatin Other (See Comments)    Muscle weakness in legs ?    Family History  Problem Relation Age of Onset  . Heart disease Father 86   Social History:  reports that he quit smoking about 60 years ago. His smoking use included Cigarettes. He has a 2.50 pack-year smoking history. He has never used smokeless tobacco. He reports that he does not drink alcohol or use drugs.  ROS: A complete review of systems was performed.  All systems are negative except for pertinent findings as noted. ROS   Physical Exam:  Vital signs in last 24 hours: Temp:  [97.8 F (36.6 C)-97.9 F (36.6 C)] 97.8 F (36.6 C) (09/15 0527) Pulse Rate:  [64-65] 65 (09/15 0527) Resp:  [16-18] 18 (09/15 0527) BP: (148-155)/(51-92) 155/92 (09/15 0527) SpO2:  [97 %-99 %] 99 % (09/15 0527) Weight:  [95.3 kg (210 lb)-95.3 kg (210 lb 3 oz)] 95.3 kg (210 lb) (09/15 0550) Psych/General:  Alert and oriented, No acute distress HEENT: Normocephalic, atraumatic Neck: No JVD or lymphadenopathy Cardiovascular: Regular rate and rhythm Lungs: Regular rate and effort Abdomen: Soft, nontender, nondistended, no abdominal masses Extremities: No edema Neurologic: Grossly intact   Laboratory Data:  Results for orders placed or performed during the hospital encounter of 04/10/16 (from the past 24 hour(s))  PT- INR Day of Surgery     Status: Abnormal   Collection Time: 04/10/16  5:46 AM  Result Value Ref Range   Prothrombin Time 21.4 (H) 11.4 - 15.2 seconds   INR 1.83   PTT Day of Surgery     Status: None   Collection Time: 04/10/16  5:46 AM  Result Value Ref Range   aPTT 31 24 - 36 seconds   No results found for this or any previous visit (from the past 240 hour(s)). Creatinine:  Recent Labs  04/09/16 1215  CREATININE 2.25*   I reviewed his CT scan images. I reviewed hospital and office chart notes.   Impression/Assessment:  Bilateral hydronephrosis  Plan:  I discussed with the  patient the nature, potential benefits, risks and alternatives to cystoscopy, bilateral RGP, bilateral ureteral stent placement, including side effects of the proposed treatment, the likelihood of the patient achieving the goals of the procedure, and any potential problems that might occur during the procedure or recuperation. We discussed we might fail to gain RG access. We discussed other options such as continued surveillance vs. PCNx's. I drew him a picture of the anatomy and procedure. We discussed bleeding, infection, ureteral injury and stent pain and/or malfunction among other risks. All questions answered. Patient elects to proceed.    Caliph Borowiak 04/10/2016, 7:33 AM

## 2016-04-10 NOTE — Discharge Instructions (Signed)
Ureteral Stent Implantation, Care After Refer to this sheet in the next few weeks. These instructions provide you with information on caring for yourself after your procedure. Your health care provider may also give you more specific instructions. Your treatment has been planned according to current medical practices, but problems sometimes occur. Call your health care provider if you have any problems or questions after your procedure. WHAT TO EXPECT AFTER THE PROCEDURE You should be back to normal activity within 48 hours after the procedure. Nausea and vomiting may occur and are commonly the result of anesthesia. It is common to experience sharp pain in the back or lower abdomen and penis with voiding. This is caused by movement of the ends of the stent with the act of urinating.It usually goes away within minutes after you have stopped urinating. HOME CARE INSTRUCTIONS Make sure to drink plenty of fluids. You may have small amounts of bleeding, causing your urine to be red. This is normal. Certain movements may trigger pain or a feeling that you need to urinate. You may be given medicines to prevent infection or bladder spasms. Be sure to take all medicines as directed. Only take over-the-counter or prescription medicines for pain, discomfort, or fever as directed by your health care provider. Do not take aspirin, as this can make bleeding worse.  Your stents will be left in until the blockage is resolved and must be changed in a few months. Be sure to keep all follow-up appointments so your health care provider can check that you are healing properly.  SEEK MEDICAL CARE IF:  You experience increasing pain.  Your pain medicine is not working. SEEK IMMEDIATE MEDICAL CARE IF:  Your urine is dark red or has blood clots.  You are leaking urine (incontinent).  You have a fever, chills, feeling sick to your stomach (nausea), or vomiting.  Your pain is not relieved by pain medicine.  The end  of the stent comes out of the urethra.  You are unable to urinate.   This information is not intended to replace advice given to you by your health care provider. Make sure you discuss any questions you have with your health care provider.   Document Released: 03/15/2013 Document Revised: 07/18/2013 Document Reviewed: 01/25/2015 Elsevier Interactive Patient Education [employer].

## 2016-04-10 NOTE — Progress Notes (Signed)
Dr. Marcell Barlow in and made aware of patient's heart rates and EKG readings- also made aware of sleep apnea history and use of C-Pap- O.K. To go to Short Stay.

## 2016-04-12 ENCOUNTER — Other Ambulatory Visit: Payer: Self-pay | Admitting: Cardiology

## 2016-04-14 ENCOUNTER — Ambulatory Visit: Payer: Medicare Other | Admitting: Physical Therapy

## 2016-04-14 DIAGNOSIS — R2681 Unsteadiness on feet: Secondary | ICD-10-CM | POA: Diagnosis not present

## 2016-04-14 DIAGNOSIS — M6281 Muscle weakness (generalized): Secondary | ICD-10-CM | POA: Diagnosis not present

## 2016-04-14 DIAGNOSIS — R2689 Other abnormalities of gait and mobility: Secondary | ICD-10-CM

## 2016-04-14 DIAGNOSIS — R208 Other disturbances of skin sensation: Secondary | ICD-10-CM

## 2016-04-14 NOTE — Therapy (Signed)
[facility] [address] [place], [place], [address] Phone: [phone]   Fax:  [phone]  Physical Therapy Treatment and Discharge Summary  Patient Details  Name: Edward Woodward MRN: [id number] Date of Birth: 01-02-1927 Referring Provider: Lajean Manes, MD  Encounter Date: 04/14/2016      PT End of Session - 04/14/16 1729    Visit Number 10   Number of Visits 17   Date for PT Re-Evaluation 05/01/16   Authorization Type Medicare primary; AARP secondary   PT Start Time 1320   PT Stop Time 1358   PT Time Calculation (min) 38 min   Equipment Utilized During Treatment Gait belt   Activity Tolerance Patient limited by fatigue   Behavior During Therapy WFL for tasks assessed/performed      Past Medical History:  Diagnosis Date  . AMD (age-related macular degeneration), bilateral   . Anemia   . Anginal pain ([place])    pts wife states pt had to use nitroglycerin this am 04/09/2016  . CAD (coronary artery disease)     NYHA Class 1B, CABG 1985  . CHF (congestive heart failure) ([place])   . Collagen vascular disease ([place])   . Collapsed lung    right   . Complication of anesthesia    pts wife states pt gets confused   . Degeneration of lumbar or lumbosacral intervertebral disc   . Depressive disorder, not elsewhere classified   . Dysrhythmia   . GERD (gastroesophageal reflux disease)   . History of blood transfusion   . History of hiatal hernia   . HTN (hypertension)   . Hypercholesteremia   . Lower leg edema    bilat  . Multiple falls   . Myocardial infarction ([place])   . Neuromuscular disorder (HCC)    neuropathy  . Neuropathy ([place])   . Non Hodgkin's lymphoma ([place])   . OSA on CPAP   . PE (pulmonary embolism) 03/2015  . Presence of permanent cardiac pacemaker   . Prostate cancer ([place])   . Shortness of breath dyspnea    with exertion   . Sinoatrial node dysfunction (HCC)   . Ulcers of both lower legs (HCC)     history of   . Urinary incontinence     Past Surgical History:  Procedure Laterality Date  . APPENDECTOMY    . CARDIAC CATHETERIZATION N/A 08/27/2015   Procedure: Left Heart Cath and Cors/Grafts Angiography;  Surgeon: Belva Crome, MD;  Location: [facility];  Service: Cardiovascular;  Laterality: N/A;  . COLONOSCOPY N/A 08/26/2015   Procedure: COLONOSCOPY;  Surgeon: Clarene Essex, MD;  Location: [facility] ENDOSCOPY;  Service: Endoscopy;  Laterality: N/A;  . CORONARY ANGIOPLASTY WITH STENT PLACEMENT     s/p bare metal stent implant SVG-diagonal 11/23/05, s/p BM stent implantation SVG diagonal 10/22/06 (feeds LAD), and 05/2010 with DES to left circumflex  . CORONARY ARTERY BYPASS GRAFT    . CYSTOSCOPY W/ URETERAL STENT PLACEMENT Bilateral 04/10/2016   Procedure: CYSTOSCOPY WITH RETROGRADE PYELOGRAM/URETERAL STENT PLACEMENT;  Surgeon: Festus Aloe, MD;  Location: [facility];  Service: Urology;  Laterality: Bilateral;  . ENDARTERECTOMY     bilat   . ESOPHAGOGASTRODUODENOSCOPY (EGD) WITH PROPOFOL N/A 08/26/2015   Procedure: ESOPHAGOGASTRODUODENOSCOPY (EGD) WITH PROPOFOL;  Surgeon: Clarene Essex, MD;  Location: [facility] ENDOSCOPY;  Service: Endoscopy;  Laterality: N/A;  . EXPLORATORY LAPAROTOMY    . LUMBAR FUSION    . PACEMAKER INSERTION    . ROTATOR CUFF REPAIR  bilat  . TONSILLECTOMY      There were no vitals filed for this visit.      Subjective Assessment - 04/14/16 1520    Subjective Wife reports pt was hospitalized for cystoscopy, B ureteral stent placement over the weekend. Pt has a doctor's appt tomorrow, during which pt will be scheduling a biopsy to determine reason for urinary tract issues. Wife believes patient may have cancer and need chemotherapy after said biopsy. Upon further conversation about lack of progress in PT, wife expressing feeling as though PT is "good for him," stating, "It makes him get up and take a shower, and that's a goof thing right now. He can't stop moving."    Patient is accompained by: Family member  wife   Pertinent History Likes to be called "[name]".   Precautions: pacemaker.  PMH significant for: HTN, HLD, A-Fib with pacemaker, OSA on CPAP, NSTEMI s/p CABG, CKD stage III, DVT LLE on chronic coumadin, peripheral neuropathy, anemia, prostate cancer, Non-Hodgkin's Lymphoma (inactive, but suspicion for reoccurrence at this time,per wife)   Diagnostic tests CT of head (01/12/15): age-related atrophy and chronic microvascular ischemic disease.   Patient Stated Goals for patient to be able to go to the bathroom on his own; to be able to get up off the floor after fall; to stand up from toilet without needing physical assistance from wife   Currently in Pain? No/denies            [facility] PT Assessment - 04/14/16 0001      Ambulation/Gait   Gait velocity 1.29 ft/sec  < 1.8 seconds suggests recurrent fall risk     Standardized Balance Assessment   Standardized Balance Assessment Timed Up and Go Test     Timed Up and Go Test   TUG Normal TUG   Normal TUG (seconds) 42.91  using RW                     [facility] Adult PT Treatment/Exercise - 04/14/16 0001      Transfers   Transfers Sit to Stand;Stand to Sit   Sit to Stand 5: Supervision   Sit to Stand Details (indicate cue type and reason) with RW   Stand to Sit 5: Supervision   Stand to Sit Details with RW     Ambulation/Gait   Ambulation/Gait Yes   Ambulation/Gait Assistance 5: Supervision   Ambulation/Gait Assistance Details with RW   Ambulation Distance (Feet) 200 Feet  2 x100'   Assistive device Rolling walker;Other (Comment)   Gait Pattern Step-through pattern;Decreased stride length;Decreased hip/knee flexion - right;Decreased hip/knee flexion - left;Decreased dorsiflexion - right;Decreased dorsiflexion - left;Right foot flat;Left foot flat;Right flexed knee in stance;Left flexed knee in stance;Trunk flexed;Wide base of support   Ambulation Surface Level;Indoor     Exercises    Other Exercises  NuStep with BUE/BLE's (concurrently with education) at level 5 for initial 5 minutes, level 3 for final 3 minutes.                PT Education - 04/14/16 1523    Education provided Yes   Education Details Extensive education provided on the fact that the pt must demonstrate measurable, objective progress in order to justify continued PT services. Also educated pt/wife on the fact that pt may require PT to regain strength/endurance after upcoming procedures. Therefore, recommending DC from PT at this time. Encouraged wife to discuss outpatient PT with MD in the future should pt have functional decline.  Person(s) Educated Patient;Spouse   Methods Explanation   Comprehension Verbalized understanding          PT Short Term Goals - 04/14/16 1735      PT SHORT TERM GOAL #1   Title Pt will perform HEP with assist of wife to maximize functional gains made in PT.  (Target date: 03/30/16)   Baseline Met 9/6.   Status Achieved     PT SHORT TERM GOAL #2   Title Complete Berg and improve score by 5 points from baseline to indicate improved standing balance.  (03/30/16)   Baseline Baseline Berg score = 17/56;  9/8: Merrilee Jansky = 18/56   Status Partially Met     PT SHORT TERM GOAL #3   Title Pt will improve TUG time from 26.15 seconds to </= 21 seconds to indicate increased efficiency of mobility. (03/30/16)   Baseline 9/19: TUG = 42.91 seconds with RW   Status Not Met     PT SHORT TERM GOAL #4   Title Pt will ambulate x200' over level, indoor surfaces with mod I using LRAD to indicate increased safety with household mobility.  (03/30/16)   Baseline 04/06/16: pt able to meet distance, however not at mod I level   Status Partially Met     PT SHORT TERM GOAL #5   Title Pt will consistently perform sit <> stand from standard chair with mod I, no cueing to indicate increased safety with functional transfers. (03/30/16)   Baseline 04/06/16: pt  continues to need cues for safety with  transfers.    Status Not Met     PT SHORT TERM GOAL #6   Title Pt will consistently perform supine <> sit with mod I, no cueing to indicate increased independence with bed mobility. (03/30/16)   Baseline Met 9/8.   Status Achieved           PT Long Term Goals - 04/14/16 1741      PT LONG TERM GOAL #1   Title Pt will improve Berg score from 17 to 22/56 to indicate improved functional standing balance.  (Target date: 04/27/16)   Baseline Not assessed due to DC prior to end of POC.   Status Unable to assess     PT LONG TERM GOAL #2   Title Pt will improve TUG time from 26.15 seconds to < /= 16 seconds to indicate increased efficiency of mobility. (04/27/16)   Baseline 9/8: Deferred due to addition of weights to RW.    Status Deferred     PT LONG TERM GOAL #3   Title Pt will improve gait velocity from 1.9 ft/sec to >/= 1.31 ft/sec to indicate pt status of community ambulator.  (109/2/17)   Baseline 9/19: gait velocity = 1.29 ft/sec   Status Not Met     PT LONG TERM GOAL #4   Title Pt will negotiate standard ramp and curb step with supervision using LRAD to indicate increased safety transversing community obstacles. (04/27/16)   Baseline Not assessed due to DC prior to end of POC.   Status Unable to assess     PT LONG TERM GOAL #5   Title Pw will ambulate > 500' over unlevel, paved surfaces with supervision using LRAD to indicate increased activity tolerance, independence with community mobility.  (04/27/16)   Baseline Not formally assessed; however, unmet due to pt unable to ambulate >100' without requiring seated rest break due to fatigue.   Status Not Met     PT LONG TERM GOAL #  6   Title Pt will transfer from supine on floor to seated on EOM using standard chair/mat with min A to indicate increased safety with fall recovery.  (04/27/16)   Baseline 9/8: Deferred due to safety concerns, significant weakness.   Status Deferred     PT LONG TERM GOAL #7   Title Pt will sustain no  falls for 3 consecutive weeks to indicate increased safety with functional mobility.  (04/27/16)   Status Not Met               Plan - April 20, 2016 1743    Clinical Impression Statement Patient has demonstrated functional decline since beginning this episode of physical therapy. Wife suspects this is due to multiple comorbidities. Pt has not been performing home walking program or [place] HEP due to multiple medical appts. Pt has multiple additional appointments and procedures over the next month. Therefore, PT recommending DC from outpatient PT at this time. Encouraged wife to speak with MD about returning to PT after patient finishes said procedures.  Pt/wife verbalized understanding and were in full agreement with DC plan.    Consulted and Agree with Plan of Care Patient;Family member/caregiver   Family Member Consulted wife, [name]      Patient will benefit from skilled therapeutic intervention in order to improve the following deficits and impairments:     Visit Diagnosis: Other abnormalities of gait and mobility  Muscle weakness (generalized)  Unsteadiness on feet  Other disturbances of skin sensation       G-Codes - 20-Apr-2016 1342    Functional Assessment Tool Used gait velocity = 1.29 ft/sec; TUG = 42.91 sec with RW; requires supervision for sit <> stand with RW   Functional Limitation Mobility: Walking and moving around   Mobility: Walking and Moving Around Goal Status [phone]) At least 40 percent but less than 60 percent impaired, limited or restricted   Mobility: Walking and Moving Around Discharge Status [phone]) At least 60 percent but less than 80 percent impaired, limited or restricted      Problem List Patient Active Problem List   Diagnosis Date Noted  . 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 Gait instability 03/06/2016  . Palliative care encounter   . Weakness generalized   . Supratherapeutic INR 01/13/2016  . Encephalopathy acute 01/13/2016  . Falls 01/12/2016  . Failure  to thrive in adult 01/12/2016  . Retroperitoneal mass 01/12/2016  . Hydronephrosis 01/12/2016  . AKI (acute kidney injury) ([place]) 01/12/2016  . Weakness 01/12/2016  . Dehydration 12/19/2015  . Long term current use of anticoagulant therapy 12/19/2015  . Hypokalemia 12/19/2015  . Peripheral edema 12/19/2015  . Rectal bleeding 12/19/2015  . Prostate cancer ([place]) 12/19/2015  . Chronic diastolic heart failure ([place]) 08/24/2015  . Pain in the chest   . NSTEMI (non-ST elevated myocardial infarction) ([place])   . Iron deficiency anemia due to chronic blood loss   . Chest pain 08/23/2015  . Encounter for chemotherapy management 05/06/2015  . Pulmonary embolus ([place]) 04/01/2015  . CKD (chronic kidney disease), stage III 04/01/2015  . Chronic anemia 04/01/2015  . Acute respiratory failure with hypoxia ([place]) 04/01/2015  . Sinus node dysfunction chronotropic incompetence 08/23/2013  . Cardiac pacemaker -[employer] 08/23/2013  . Atrial fibrillation ([place]) 08/23/2013  . Fall 07/11/2013  . Dyspnea 07/10/2013  . OSA on CPAP 07/10/2013  . Peripheral vascular disease ([place]) 06/07/2013  . Atherosclerosis of coronary artery bypass graft of native heart without angina pectoris 06/07/2013  . Hyperlipidemia 06/07/2013  . Essential  hypertension, benign 06/07/2013  . Other malignant lymphomas, unspecified site, extranodal and solid organ sites 07/03/2011    Name: Edward Woodward MRN: [id number] Date of Birth: 1927/01/02   PHYSICAL THERAPY DISCHARGE SUMMARY  Visits from Start of Care: 10  Current functional level related to goals / functional outcomes: See above.   Remaining deficits: See above.   Education / Equipment: See above. [place] HEP and walking program. Fall prevention strategies in home environment. Education on community fitness and fall prevention resources, how to safely assist pt onto/off of NuStep, Silver Sneakers.  Plan: Patient agrees to discharge.  Patient goals were not met.  Patient is being discharged due to lack of progress.  ?????         Billie Ruddy, PT, DPT [facility] [address] [place], [place], [address] Phone: [phone]   Fax:  [phone] 04/14/16, 5:53 PM

## 2016-04-15 ENCOUNTER — Telehealth: Payer: Self-pay | Admitting: Oncology

## 2016-04-15 ENCOUNTER — Other Ambulatory Visit (HOSPITAL_BASED_OUTPATIENT_CLINIC_OR_DEPARTMENT_OTHER): Payer: Medicare Other

## 2016-04-15 ENCOUNTER — Ambulatory Visit (HOSPITAL_BASED_OUTPATIENT_CLINIC_OR_DEPARTMENT_OTHER): Payer: Medicare Other | Admitting: Oncology

## 2016-04-15 VITALS — BP 115/42 | HR 65 | Temp 97.6°F | Resp 17 | Ht 70.0 in | Wt 208.5 lb

## 2016-04-15 DIAGNOSIS — R935 Abnormal findings on diagnostic imaging of other abdominal regions, including retroperitoneum: Secondary | ICD-10-CM | POA: Diagnosis not present

## 2016-04-15 DIAGNOSIS — Z7901 Long term (current) use of anticoagulants: Secondary | ICD-10-CM

## 2016-04-15 DIAGNOSIS — C61 Malignant neoplasm of prostate: Secondary | ICD-10-CM

## 2016-04-15 DIAGNOSIS — Z8572 Personal history of non-Hodgkin lymphomas: Secondary | ICD-10-CM | POA: Diagnosis not present

## 2016-04-15 DIAGNOSIS — Z86718 Personal history of other venous thrombosis and embolism: Secondary | ICD-10-CM

## 2016-04-15 DIAGNOSIS — D649 Anemia, unspecified: Secondary | ICD-10-CM | POA: Diagnosis not present

## 2016-04-15 DIAGNOSIS — Z23 Encounter for immunization: Secondary | ICD-10-CM | POA: Diagnosis not present

## 2016-04-15 DIAGNOSIS — I4891 Unspecified atrial fibrillation: Secondary | ICD-10-CM

## 2016-04-15 LAB — CBC WITH DIFFERENTIAL/PLATELET
BASO%: 0.2 % (ref 0.0–2.0)
Basophils Absolute: 0 10*3/uL (ref 0.0–0.1)
EOS%: 2.7 % (ref 0.0–7.0)
Eosinophils Absolute: 0.2 10*3/uL (ref 0.0–0.5)
HCT: 30.5 % — ABNORMAL LOW (ref 38.4–49.9)
HEMOGLOBIN: 10 g/dL — AB (ref 13.0–17.1)
LYMPH#: 1.6 10*3/uL (ref 0.9–3.3)
LYMPH%: 29 % (ref 14.0–49.0)
MCH: 29 pg (ref 27.2–33.4)
MCHC: 32.8 g/dL (ref 32.0–36.0)
MCV: 88.4 fL (ref 79.3–98.0)
MONO#: 0.5 10*3/uL (ref 0.1–0.9)
MONO%: 9 % (ref 0.0–14.0)
NEUT%: 59.1 % (ref 39.0–75.0)
NEUTROS ABS: 3.3 10*3/uL (ref 1.5–6.5)
Platelets: 126 10*3/uL — ABNORMAL LOW (ref 140–400)
RBC: 3.45 10*6/uL — ABNORMAL LOW (ref 4.20–5.82)
RDW: 14.4 % (ref 11.0–14.6)
WBC: 5.6 10*3/uL (ref 4.0–10.3)

## 2016-04-15 LAB — COMPREHENSIVE METABOLIC PANEL
ALBUMIN: 3.3 g/dL — AB (ref 3.5–5.0)
ALK PHOS: 72 U/L (ref 40–150)
ALT: 16 U/L (ref 0–55)
AST: 19 U/L (ref 5–34)
Anion Gap: 11 mEq/L (ref 3–11)
BILIRUBIN TOTAL: 0.43 mg/dL (ref 0.20–1.20)
BUN: 37.4 mg/dL — AB (ref 7.0–26.0)
CO2: 26 mEq/L (ref 22–29)
CREATININE: 2 mg/dL — AB (ref 0.7–1.3)
Calcium: 10.4 mg/dL (ref 8.4–10.4)
Chloride: 103 mEq/L (ref 98–109)
EGFR: 29 mL/min/{1.73_m2} — ABNORMAL LOW (ref >90)
GLUCOSE: 108 mg/dL (ref 70–140)
Potassium: 3.6 mEq/L (ref 3.5–5.1)
SODIUM: 139 meq/L (ref 136–145)
TOTAL PROTEIN: 5.6 g/dL — AB (ref 6.4–8.3)

## 2016-04-15 LAB — PROTIME-INR
INR: 3 (ref 2.00–3.50)
Protime: 36 Seconds — ABNORMAL HIGH (ref 10.6–13.4)

## 2016-04-15 MED ORDER — INFLUENZA VAC SPLIT QUAD 0.5 ML IM SUSY
0.5000 mL | PREFILLED_SYRINGE | Freq: Once | INTRAMUSCULAR | Status: AC
  Administered 2016-04-15: 0.5 mL via INTRAMUSCULAR
  Filled 2016-04-15: qty 0.5

## 2016-04-15 NOTE — Telephone Encounter (Signed)
Gave patient/relative avs report and appointments for October  °

## 2016-04-15 NOTE — Progress Notes (Signed)
[facility] OFFICE PROGRESS NOTE   Diagnosis: Prostate cancer, lymphoma  INTERVAL HISTORY:   Edward Woodward returns as scheduled. He underwent placement of bilateral ureter stents on 04/10/2016. Bilateral ureteral strictures and hydronephrosis was noted. He reports intermittent hematuria and burning with urination since placement of the stents. He has been weaker over the past several days. He had an episode of chest pain requiring nitroglycerin this week. No dyspnea. He continues his ITP and prednisone.  Objective:  Vital signs in last 24 hours:  Blood pressure (!) 115/42, pulse 65, temperature 97.6 F (36.4 C), temperature source Oral, resp. rate 17, height 5\' 10"  (1.778 m), weight 208 lb 8 oz (94.6 kg), SpO2 98 %.    Lymphatics: ? Lymph node at the left scalene/trapezius region. No cervical, supra-clavicular, axillary, or inguinal nodes Resp: Scattered inspiratory rhonchi, no respiratory distress Cardio: Regular rate and rhythm GI: No hepatosplenomegaly, nontender Vascular: Trace edema at the left greater than right lower leg  Lab Results:  Lab Results  Component Value Date   WBC 5.6 04/15/2016   HGB 10.0 (L) 04/15/2016   HCT 30.5 (L) 04/15/2016   MCV 88.4 04/15/2016   PLT 126 (L) 04/15/2016   NEUTROABS 3.3 04/15/2016   Potassium 3.6, BUN 37.4, creatinine 2.0, calcium 10.4, albumin 3.3  Medications: I have reviewed the patient's current medications.  Assessment/Plan: 1. Non-Hodgkin's lymphoma (follicular grade 3 lymphoma) - status post 6 cycles of Cytoxan/prednisone/rituximab 07/01/2007 through 10/21/2007. He remains in clinical remission. 2. CT chest 04/01/2015 with no lymphadenopathy  CT abdomen/pelvis 05/01/2015 with mild progression of abdomen/pelvic lymphadenopathy 3. Prostate cancer - he has been treated with hormonal therapy, the PSA was stable on 10/15/2014  PSA increased 04/26/2015   Bone scan 05/09/2015 with unchanged increased activity in  the thoracic and lumbar spine felt to most likely be degenerative.   Initiation of Abiraterone and prednisone 05/11/2015. Normalization of the PSA on Abiraterone. 4. History of a normocytic anemia secondary to non-Hodgkin's lymphoma, chemotherapy, and renal insufficiency - progressive secondary to GI bleeding, stable 5. History of Mild thrombocytopenia  6. History of bilateral hydronephrosis. Renal ultrasound 09/26/2014 consistent with medical renal disease. No hydronephrosis. 7. Coronary artery disease - status post coronary artery stent placement. 8. History of noncalcified lung nodules. 9. History of severe neutropenia July 2009 - likely related to delayed toxicity from rituximab. 10. Macular degeneration. 11. Right middle lobe density on a CT of the chest 06/18/2010 measuring 2.5 x 1.6 cm with mildly increased metabolic activity (SUV max 2.5) on a PET scan 06/27/2010. The area of increased density was less confluent and appeared smaller on a restaging CT 09/08/2010. Right middle lobe "scarring" with no new or suspicious pulmonary nodule on a CT 08/04/2011. 12. Mild increased metabolic activity associated with several lymph nodes on the PET scan 06/27/2010 - likely related to lymphoma. No lymphadenopathy in the mediastinum or axillary areas on the CT 08/04/2011. 13. Right hip discomfort-he received a steroid injection by his primary physician without improvement. He saw Dr. Collier Salina and there was a question of a lytic process in the right pelvis. A bone scan on 09/21/2012 revealed no evidence of metastatic disease. 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 Renal insufficiency-progressive CT 01/12/2016 confirmed progressive hydronephrosis  Placement of bilateral ureter stents 04/10/2016 15. Left leg DVT and pulmonary embolism 04/01/2015-on Coumadin 16. Admission 08/23/2015 with severe anemia and GI bleeding  Upper and lower endoscopy 08/26/2015 without a clear  source for bleeding identified 17.  Leg edema/anasarca-improved with leg wrapping and diuretics 18. Anemia secondary to chronic disease, renal failure, and possibly lymphoma 19. 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  history of Elevated calcium-potentially hypercalcemia of malignancy    Disposition:  Edward Woodward underwent placement of ureter stents. The creatinine is stable. We discussed proceeding with a biopsy of the retroperitoneal soft tissue. His performance status has declined over the past week. He had an episode of chest pain earlier this week. We decided to hold on the retroperitoneal soft tissue biopsy for now.  We adjusted the Coumadin dose today. He received an influenza vaccine. He will return for an office visit in 2 weeks.  [name], MD  04/15/2016  12:17 PM

## 2016-04-16 ENCOUNTER — Ambulatory Visit: Payer: Medicare Other | Admitting: Physical Therapy

## 2016-04-20 ENCOUNTER — Ambulatory Visit (INDEPENDENT_AMBULATORY_CARE_PROVIDER_SITE_OTHER): Payer: Medicare Other | Admitting: *Deleted

## 2016-04-20 DIAGNOSIS — Z950 Presence of cardiac pacemaker: Secondary | ICD-10-CM

## 2016-04-20 LAB — CUP PACEART INCLINIC DEVICE CHECK
Brady Statistic RA Percent Paced: 92 %
Brady Statistic RV Percent Paced: 7 %
Implantable Lead Implant Date: 20081013
Implantable Lead Implant Date: 20081013
Implantable Lead Location: 753860
Implantable Lead Model: 4470
Implantable Lead Serial Number: 205344
Implantable Lead Serial Number: 210110
Lead Channel Pacing Threshold Pulse Width: 0.4 ms
Lead Channel Pacing Threshold Pulse Width: 0.8 ms
Lead Channel Setting Pacing Amplitude: 2.4 V
Lead Channel Setting Pacing Pulse Width: 0.8 ms
MDC IDC LEAD LOCATION: 753859
MDC IDC LEAD MODEL: 4457
MDC IDC MSMT LEADCHNL RA IMPEDANCE VALUE: 550 Ohm
MDC IDC MSMT LEADCHNL RA PACING THRESHOLD AMPLITUDE: 1 V
MDC IDC MSMT LEADCHNL RV IMPEDANCE VALUE: 460 Ohm
MDC IDC MSMT LEADCHNL RV PACING THRESHOLD AMPLITUDE: 1 V
MDC IDC MSMT LEADCHNL RV SENSING INTR AMPL: 6.8 mV
MDC IDC PG SERIAL: 317986
MDC IDC SESS DTM: 20170925040000
MDC IDC SET LEADCHNL RA PACING AMPLITUDE: 2 V
MDC IDC SET LEADCHNL RV SENSING SENSITIVITY: 2.5 mV

## 2016-04-20 NOTE — Progress Notes (Signed)
Pacemaker check in clinic. Normal device function. Thresholds, sensing, impedances consistent with previous measurements. Device programmed to maximize longevity. No mode switches. 1 high ventricular rates noted, peak V 190. Device programmed at appropriate safety margins. Histogram distribution appropriate for patient activity level. Device programmed to optimize intrinsic conduction. Estimated longevity 50yr. ROV with DC in 35mo and SK 09/2016

## 2016-04-21 ENCOUNTER — Ambulatory Visit: Payer: Medicare Other | Admitting: Physical Therapy

## 2016-04-23 ENCOUNTER — Ambulatory Visit: Payer: Medicare Other | Admitting: Physical Therapy

## 2016-04-24 DIAGNOSIS — N139 Obstructive and reflux uropathy, unspecified: Secondary | ICD-10-CM | POA: Diagnosis not present

## 2016-04-24 DIAGNOSIS — C61 Malignant neoplasm of prostate: Secondary | ICD-10-CM | POA: Diagnosis not present

## 2016-04-24 DIAGNOSIS — C8233 Follicular lymphoma grade IIIa, intra-abdominal lymph nodes: Secondary | ICD-10-CM | POA: Diagnosis not present

## 2016-04-27 DIAGNOSIS — H35353 Cystoid macular degeneration, bilateral: Secondary | ICD-10-CM | POA: Diagnosis not present

## 2016-04-28 DIAGNOSIS — N139 Obstructive and reflux uropathy, unspecified: Secondary | ICD-10-CM | POA: Diagnosis not present

## 2016-04-28 DIAGNOSIS — N183 Chronic kidney disease, stage 3 (moderate): Secondary | ICD-10-CM | POA: Diagnosis not present

## 2016-04-28 DIAGNOSIS — D631 Anemia in chronic kidney disease: Secondary | ICD-10-CM | POA: Diagnosis not present

## 2016-04-28 DIAGNOSIS — Z950 Presence of cardiac pacemaker: Secondary | ICD-10-CM | POA: Diagnosis not present

## 2016-04-28 DIAGNOSIS — I129 Hypertensive chronic kidney disease with stage 1 through stage 4 chronic kidney disease, or unspecified chronic kidney disease: Secondary | ICD-10-CM | POA: Diagnosis not present

## 2016-04-28 DIAGNOSIS — E7800 Pure hypercholesterolemia, unspecified: Secondary | ICD-10-CM | POA: Diagnosis not present

## 2016-04-28 DIAGNOSIS — G4733 Obstructive sleep apnea (adult) (pediatric): Secondary | ICD-10-CM | POA: Diagnosis not present

## 2016-04-28 DIAGNOSIS — C61 Malignant neoplasm of prostate: Secondary | ICD-10-CM | POA: Diagnosis not present

## 2016-04-28 DIAGNOSIS — Z6831 Body mass index (BMI) 31.0-31.9, adult: Secondary | ICD-10-CM | POA: Diagnosis not present

## 2016-04-28 DIAGNOSIS — N184 Chronic kidney disease, stage 4 (severe): Secondary | ICD-10-CM | POA: Diagnosis not present

## 2016-04-28 DIAGNOSIS — I495 Sick sinus syndrome: Secondary | ICD-10-CM | POA: Diagnosis not present

## 2016-04-28 DIAGNOSIS — N2581 Secondary hyperparathyroidism of renal origin: Secondary | ICD-10-CM | POA: Diagnosis not present

## 2016-04-28 DIAGNOSIS — I2510 Atherosclerotic heart disease of native coronary artery without angina pectoris: Secondary | ICD-10-CM | POA: Diagnosis not present

## 2016-04-29 ENCOUNTER — Ambulatory Visit: Payer: Medicare Other | Admitting: Physical Therapy

## 2016-04-29 ENCOUNTER — Other Ambulatory Visit: Payer: Self-pay | Admitting: Oncology

## 2016-04-30 ENCOUNTER — Other Ambulatory Visit (HOSPITAL_BASED_OUTPATIENT_CLINIC_OR_DEPARTMENT_OTHER): Payer: Medicare Other

## 2016-04-30 ENCOUNTER — Telehealth: Payer: Self-pay | Admitting: Oncology

## 2016-04-30 ENCOUNTER — Ambulatory Visit (HOSPITAL_BASED_OUTPATIENT_CLINIC_OR_DEPARTMENT_OTHER): Payer: Medicare Other | Admitting: Nurse Practitioner

## 2016-04-30 VITALS — BP 147/81 | HR 96 | Temp 97.7°F | Resp 18 | Ht 70.0 in | Wt 202.8 lb

## 2016-04-30 DIAGNOSIS — Z8572 Personal history of non-Hodgkin lymphomas: Secondary | ICD-10-CM | POA: Diagnosis not present

## 2016-04-30 DIAGNOSIS — N189 Chronic kidney disease, unspecified: Secondary | ICD-10-CM

## 2016-04-30 DIAGNOSIS — Z7901 Long term (current) use of anticoagulants: Secondary | ICD-10-CM

## 2016-04-30 DIAGNOSIS — C61 Malignant neoplasm of prostate: Secondary | ICD-10-CM

## 2016-04-30 DIAGNOSIS — D631 Anemia in chronic kidney disease: Secondary | ICD-10-CM

## 2016-04-30 DIAGNOSIS — R935 Abnormal findings on diagnostic imaging of other abdominal regions, including retroperitoneum: Secondary | ICD-10-CM

## 2016-04-30 DIAGNOSIS — I4891 Unspecified atrial fibrillation: Secondary | ICD-10-CM

## 2016-04-30 LAB — CBC WITH DIFFERENTIAL/PLATELET
BASO%: 0.4 % (ref 0.0–2.0)
BASOS ABS: 0 10*3/uL (ref 0.0–0.1)
EOS ABS: 0.2 10*3/uL (ref 0.0–0.5)
EOS%: 3 % (ref 0.0–7.0)
HEMATOCRIT: 32.5 % — AB (ref 38.4–49.9)
HGB: 10.5 g/dL — ABNORMAL LOW (ref 13.0–17.1)
LYMPH%: 30.3 % (ref 14.0–49.0)
MCH: 28.5 pg (ref 27.2–33.4)
MCHC: 32.3 g/dL (ref 32.0–36.0)
MCV: 88.3 fL (ref 79.3–98.0)
MONO#: 0.5 10*3/uL (ref 0.1–0.9)
MONO%: 8.8 % (ref 0.0–14.0)
NEUT#: 3 10*3/uL (ref 1.5–6.5)
NEUT%: 57.5 % (ref 39.0–75.0)
PLATELETS: 128 10*3/uL — AB (ref 140–400)
RBC: 3.68 10*6/uL — AB (ref 4.20–5.82)
RDW: 15.6 % — ABNORMAL HIGH (ref 11.0–14.6)
WBC: 5.2 10*3/uL (ref 4.0–10.3)
lymph#: 1.6 10*3/uL (ref 0.9–3.3)

## 2016-04-30 LAB — COMPREHENSIVE METABOLIC PANEL
ALT: <9 U/L (ref 0–55)
ANION GAP: 10 meq/L (ref 3–11)
AST: 16 U/L (ref 5–34)
Albumin: 3.4 g/dL — ABNORMAL LOW (ref 3.5–5.0)
Alkaline Phosphatase: 67 U/L (ref 40–150)
BUN: 36.8 mg/dL — ABNORMAL HIGH (ref 7.0–26.0)
CALCIUM: 10.4 mg/dL (ref 8.4–10.4)
CHLORIDE: 105 meq/L (ref 98–109)
CO2: 28 mEq/L (ref 22–29)
CREATININE: 2 mg/dL — AB (ref 0.7–1.3)
EGFR: 29 mL/min/{1.73_m2} — AB (ref >90)
Glucose: 107 mg/dl (ref 70–140)
POTASSIUM: 3.9 meq/L (ref 3.5–5.1)
Sodium: 143 mEq/L (ref 136–145)
Total Bilirubin: 0.54 mg/dL (ref 0.20–1.20)
Total Protein: 5.7 g/dL — ABNORMAL LOW (ref 6.4–8.3)

## 2016-04-30 LAB — PROTIME-INR
INR: 2 (ref 2.00–3.50)
PROTIME: 24 s — AB (ref 10.6–13.4)

## 2016-04-30 NOTE — Progress Notes (Addendum)
[place] OFFICE PROGRESS NOTE   Diagnosis:  Prostate cancer, lymphoma  INTERVAL HISTORY:   Edward Woodward returns as scheduled. His wife reports medications have been adjusted due to a low blood pressure. He fell on 04/26/2016. Appetite is poor. No fevers or sweats. He felt stronger last week, weaker now. He notes he is sleeping more.  Objective:  Vital signs in last 24 hours:  Blood pressure (!) 147/81, pulse 96, temperature 97.7 F (36.5 C), temperature source Oral, resp. rate 18, height 5\' 10"  (1.778 m), weight 202 lb 12.8 oz (92 kg), SpO2 98 %.    HEENT: Mouth is dry appearing. Lymphatics: No palpable cervical or supra clavicular lymph nodes. Resp: Rales at both lung bases. No respiratory distress. Cardio: Regular rate and rhythm. GI: Abdomen soft and nontender. No organomegaly. Vascular: Trace edema at the lower legs bilaterally left greater than right.  Skin: Resolving ecchymosis right side of face. Multiple bandages scattered over the upper extremities.    Lab Results:  Lab Results  Component Value Date   WBC 5.2 04/30/2016   HGB 10.5 (L) 04/30/2016   HCT 32.5 (L) 04/30/2016   MCV 88.3 04/30/2016   PLT 128 (L) 04/30/2016   NEUTROABS 3.0 04/30/2016    Imaging:  No results found.  Medications: I have reviewed the patient's current medications.  Assessment/Plan: 1. Non-Hodgkin's lymphoma (follicular grade 3 lymphoma) - status post 6 cycles of Cytoxan/prednisone/rituximab 07/01/2007 through 10/21/2007. He remains in clinical remission. 2. CT chest 04/01/2015 with no lymphadenopathy  CT abdomen/pelvis 05/01/2015 with mild progression of abdomen/pelvic lymphadenopathy 3. Prostate cancer - he has been treated with hormonal therapy, the PSA was stable on 10/15/2014  PSA increased 04/26/2015   Bone scan 05/09/2015 with unchanged increased activity in the thoracic and lumbar spine felt to most likely be degenerative.   Initiation of Abiraterone  and prednisone 05/11/2015. Normalization of the PSA on Abiraterone. 4. History of a normocytic anemia secondary to non-Hodgkin's lymphoma, chemotherapy, and renal insufficiency - progressive secondary to GI bleeding, stable 5. History of Mild thrombocytopenia  6. History of bilateral hydronephrosis. Renal ultrasound 09/26/2014 consistent with medical renal disease. No hydronephrosis. 7. Coronary artery disease - status post coronary artery stent placement. 8. History of noncalcified lung nodules. 9. History of severe neutropenia July 2009 - likely related to delayed toxicity from rituximab. 10. Macular degeneration. 11. Right middle lobe density on a CT of the chest 06/18/2010 measuring 2.5 x 1.6 cm with mildly increased metabolic activity (SUV max 2.5) on a PET scan 06/27/2010. The area of increased density was less confluent and appeared smaller on a restaging CT 09/08/2010. Right middle lobe "scarring" with no new or suspicious pulmonary nodule on a CT 08/04/2011. 12. Mild increased metabolic activity associated with several lymph nodes on the PET scan 06/27/2010 - likely related to lymphoma. No lymphadenopathy in the mediastinum or axillary areas on the CT 08/04/2011. 13. Right hip discomfort-he received a steroid injection by his primary physician without improvement. He saw Dr. Collier Salina and there was a question of a lytic process in the right pelvis. A bone scan on 09/21/2012 revealed no evidence of metastatic disease. 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 Renal insufficiency-progressive CT 01/12/2016 confirmed progressive hydronephrosis  Placement of bilateral ureter stents 04/10/2016 15. Left leg DVT and pulmonary embolism 04/01/2015-on Coumadin 16. Admission 08/23/2015 with severe anemia and GI bleeding  Upper and lower endoscopy 08/26/2015 without a clear source for bleeding identified 17. Leg edema/anasarca-improved with  leg wrapping and  diuretics 18. Anemia secondary to chronic disease, renal failure, and possibly lymphoma 19. 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 history of Elevated calcium-potentially hypercalcemia of malignancy   Disposition: Edward Woodward continues to have a poor performance status. He reports feeling weak and is sleeping more. We will follow-up on the calcium from today.  Edward Woodward and his wife would like to proceed with a biopsy of the retroperitoneal soft tissue. We made a referral to interventional radiology at [facility]. He was instructed to hold Coumadin 4 days prior to the procedure and resume the evening of the procedure.  He will return for a follow-up visit on 05/15/2016 to review the results.  Patient seen with Dr. Benay Spice. 25 minutes were spent face-to-face at today's visit with the majority of that time involved in counseling/coordination of care.  Addendum-calcium in normal range. Plan as above.  [name] ANP/GNP-BC   04/30/2016  11:28 AM  Edward Woodward continues to have a poor performance status. He would like to proceed with a biopsy of the retroperitoneal soft tissue.  I will see him after the biopsy procedure.  [name], M.D.

## 2016-04-30 NOTE — Telephone Encounter (Signed)
Gave relative avs report and appointments for October. Central radiology will call re bx.

## 2016-05-01 ENCOUNTER — Ambulatory Visit: Payer: Medicare Other | Admitting: Physical Therapy

## 2016-05-11 ENCOUNTER — Other Ambulatory Visit: Payer: Self-pay | Admitting: Radiology

## 2016-05-13 ENCOUNTER — Ambulatory Visit (HOSPITAL_COMMUNITY)
Admission: RE | Admit: 2016-05-13 | Discharge: 2016-05-13 | Disposition: A | Discharge status: Home / Self Care | Payer: Medicare Other | Source: Ambulatory Visit | Attending: Nurse Practitioner | Admitting: Nurse Practitioner

## 2016-05-13 ENCOUNTER — Encounter (HOSPITAL_COMMUNITY): Payer: Self-pay

## 2016-05-13 DIAGNOSIS — D479 Neoplasm of uncertain behavior of lymphoid, hematopoietic and related tissue, unspecified: Secondary | ICD-10-CM | POA: Diagnosis not present

## 2016-05-13 DIAGNOSIS — Z86711 Personal history of pulmonary embolism: Secondary | ICD-10-CM | POA: Diagnosis not present

## 2016-05-13 DIAGNOSIS — D649 Anemia, unspecified: Secondary | ICD-10-CM | POA: Insufficient documentation

## 2016-05-13 DIAGNOSIS — E7800 Pure hypercholesterolemia, unspecified: Secondary | ICD-10-CM | POA: Diagnosis not present

## 2016-05-13 DIAGNOSIS — C61 Malignant neoplasm of prostate: Secondary | ICD-10-CM | POA: Diagnosis not present

## 2016-05-13 DIAGNOSIS — R590 Localized enlarged lymph nodes: Secondary | ICD-10-CM | POA: Diagnosis not present

## 2016-05-13 DIAGNOSIS — F17210 Nicotine dependence, cigarettes, uncomplicated: Secondary | ICD-10-CM | POA: Diagnosis not present

## 2016-05-13 DIAGNOSIS — H3530 Unspecified macular degeneration: Secondary | ICD-10-CM | POA: Insufficient documentation

## 2016-05-13 DIAGNOSIS — F329 Major depressive disorder, single episode, unspecified: Secondary | ICD-10-CM | POA: Insufficient documentation

## 2016-05-13 DIAGNOSIS — K449 Diaphragmatic hernia without obstruction or gangrene: Secondary | ICD-10-CM | POA: Insufficient documentation

## 2016-05-13 DIAGNOSIS — R1909 Other intra-abdominal and pelvic swelling, mass and lump: Secondary | ICD-10-CM | POA: Diagnosis not present

## 2016-05-13 DIAGNOSIS — C8513 Unspecified B-cell lymphoma, intra-abdominal lymph nodes: Secondary | ICD-10-CM | POA: Insufficient documentation

## 2016-05-13 DIAGNOSIS — I509 Heart failure, unspecified: Secondary | ICD-10-CM | POA: Diagnosis not present

## 2016-05-13 DIAGNOSIS — K219 Gastro-esophageal reflux disease without esophagitis: Secondary | ICD-10-CM | POA: Insufficient documentation

## 2016-05-13 DIAGNOSIS — I110 Hypertensive heart disease with heart failure: Secondary | ICD-10-CM | POA: Insufficient documentation

## 2016-05-13 DIAGNOSIS — I2510 Atherosclerotic heart disease of native coronary artery without angina pectoris: Secondary | ICD-10-CM | POA: Insufficient documentation

## 2016-05-13 DIAGNOSIS — Z951 Presence of aortocoronary bypass graft: Secondary | ICD-10-CM | POA: Diagnosis not present

## 2016-05-13 DIAGNOSIS — G4733 Obstructive sleep apnea (adult) (pediatric): Secondary | ICD-10-CM | POA: Diagnosis not present

## 2016-05-13 DIAGNOSIS — R0602 Shortness of breath: Secondary | ICD-10-CM | POA: Insufficient documentation

## 2016-05-13 DIAGNOSIS — C8590 Non-Hodgkin lymphoma, unspecified, unspecified site: Secondary | ICD-10-CM | POA: Diagnosis present

## 2016-05-13 LAB — PROTIME-INR
INR: 1.15
PROTHROMBIN TIME: 14.8 s (ref 11.4–15.2)

## 2016-05-13 LAB — CBC WITH DIFFERENTIAL/PLATELET
Basophils Absolute: 0 10*3/uL (ref 0.0–0.1)
Basophils Relative: 0 %
EOS ABS: 0.1 10*3/uL (ref 0.0–0.7)
Eosinophils Relative: 2 %
HEMATOCRIT: 31.7 % — AB (ref 39.0–52.0)
HEMOGLOBIN: 10.3 g/dL — AB (ref 13.0–17.0)
LYMPHS ABS: 2.3 10*3/uL (ref 0.7–4.0)
Lymphocytes Relative: 41 %
MCH: 29.7 pg (ref 26.0–34.0)
MCHC: 32.5 g/dL (ref 30.0–36.0)
MCV: 91.4 fL (ref 78.0–100.0)
MONOS PCT: 8 %
Monocytes Absolute: 0.4 10*3/uL (ref 0.1–1.0)
NEUTROS PCT: 49 %
Neutro Abs: 2.7 10*3/uL (ref 1.7–7.7)
Platelets: 141 10*3/uL — ABNORMAL LOW (ref 150–400)
RBC: 3.47 MIL/uL — ABNORMAL LOW (ref 4.22–5.81)
RDW: 15 % (ref 11.5–15.5)
WBC: 5.4 10*3/uL (ref 4.0–10.5)

## 2016-05-13 LAB — BASIC METABOLIC PANEL
Anion gap: 7 (ref 5–15)
BUN: 33 mg/dL — AB (ref 6–20)
CHLORIDE: 104 mmol/L (ref 101–111)
CO2: 26 mmol/L (ref 22–32)
CREATININE: 1.75 mg/dL — AB (ref 0.61–1.24)
Calcium: 10.2 mg/dL (ref 8.9–10.3)
GFR calc non Af Amer: 33 mL/min — ABNORMAL LOW (ref >60)
GFR, EST AFRICAN AMERICAN: 39 mL/min — AB (ref >60)
Glucose, Bld: 116 mg/dL — ABNORMAL HIGH (ref 65–99)
Potassium: 4.2 mmol/L (ref 3.5–5.1)
Sodium: 137 mmol/L (ref 135–145)

## 2016-05-13 MED ORDER — MIDAZOLAM HCL 2 MG/2ML IJ SOLN
INTRAMUSCULAR | Status: AC
  Filled 2016-05-13: qty 6

## 2016-05-13 MED ORDER — FENTANYL CITRATE (PF) 100 MCG/2ML IJ SOLN
INTRAMUSCULAR | Status: AC | PRN
  Administered 2016-05-13 (×2): 25 ug via INTRAVENOUS

## 2016-05-13 MED ORDER — SODIUM CHLORIDE 0.9 % IV SOLN
INTRAVENOUS | Status: DC
  Administered 2016-05-13: 09:00:00 via INTRAVENOUS

## 2016-05-13 MED ORDER — MIDAZOLAM HCL 2 MG/2ML IJ SOLN
INTRAMUSCULAR | Status: AC | PRN
Start: 2016-05-13 — End: 2016-05-13
  Administered 2016-05-13 (×2): 1 mg via INTRAVENOUS

## 2016-05-13 MED ORDER — HYDROCODONE-ACETAMINOPHEN 5-325 MG PO TABS: 1.0000 | ORAL_TABLET | ORAL | Status: DC | PRN

## 2016-05-13 MED ORDER — FENTANYL CITRATE (PF) 100 MCG/2ML IJ SOLN
INTRAMUSCULAR | Status: AC
  Filled 2016-05-13: qty 4

## 2016-05-13 NOTE — Procedures (Signed)
Interventional Radiology Procedure Note  Procedure:  CT guided core biopsy of retroperitoneal lymphadenopathy  Complications: None  Estimated Blood Loss: < 10 mL  Left paraaortic lymph node mass sampled under CT guidance via 17 G needle.  18 G core biopsy x 3.  Gelfoam slurry injected on completion.  Venetia Night. Kathlene Cote, M.D Pager:  [phone]

## 2016-05-13 NOTE — H&P (Signed)
Chief Complaint: para-aortic RTP mass  Referring Physician:Dr. Dominica Severin B. Sherrill  Supervising Physician: Aletta Edouard  Patient Status:Out-pt  HPI: Edward Woodward is an 82 y.o. male who has a history of prostate cancer and lymphoma.  He is followed by Dr. Benay Spice.  He went to the ED in June of this year with abdominal pain and was found to have progressive RTP lymphadenopathy and soft tissue disease.  Initially, no plans to biopsy this were made, but on the most recent visit with the NP, the patient and his wife decided to proceed with a biopsy of this area to determine if this was progression of his lymphoma.  He has no complaints.  He is on coumadin which has been held for 5 days.  Past Medical History:  Past Medical History:  Diagnosis Date  . AMD (age-related macular degeneration), bilateral   . Anemia   . Anginal pain ([place])    pts wife states pt had to use nitroglycerin this am 04/09/2016  . CAD (coronary artery disease)     NYHA Class 1B, CABG 1985  . CHF (congestive heart failure) ([place])   . Collagen vascular disease ([place])   . Collapsed lung    right   . Complication of anesthesia    pts wife states pt gets confused   . Degeneration of lumbar or lumbosacral intervertebral disc   . Depressive disorder, not elsewhere classified   . Dysrhythmia   . GERD (gastroesophageal reflux disease)   . History of blood transfusion   . History of hiatal hernia   . HTN (hypertension)   . Hypercholesteremia   . Lower leg edema    bilat  . Multiple falls   . Myocardial infarction   . Neuromuscular disorder (HCC)    neuropathy  . Neuropathy ([place])   . Non Hodgkin's lymphoma ([place])   . OSA on CPAP   . PE (pulmonary embolism) 03/2015  . Presence of permanent cardiac pacemaker   . Prostate cancer ([place])   . Shortness of breath dyspnea    with exertion   . Sinoatrial node dysfunction (HCC)   . Ulcers of both lower legs (HCC)    history of   . Urinary incontinence     Past  Surgical History:  Past Surgical History:  Procedure Laterality Date  . APPENDECTOMY    . CARDIAC CATHETERIZATION N/A 08/27/2015   Procedure: Left Heart Cath and Cors/Grafts Angiography;  Surgeon: Belva Crome, MD;  Location: [facility] CV LAB;  Service: Cardiovascular;  Laterality: N/A;  . COLONOSCOPY N/A 08/26/2015   Procedure: COLONOSCOPY;  Surgeon: Clarene Essex, MD;  Location: [facility] ENDOSCOPY;  Service: Endoscopy;  Laterality: N/A;  . CORONARY ANGIOPLASTY WITH STENT PLACEMENT     s/p bare metal stent implant SVG-diagonal 11/23/05, s/p BM stent implantation SVG diagonal 10/22/06 (feeds LAD), and 05/2010 with DES to left circumflex  . CORONARY ARTERY BYPASS GRAFT    . CYSTOSCOPY W/ URETERAL STENT PLACEMENT Bilateral 04/10/2016   Procedure: CYSTOSCOPY WITH RETROGRADE PYELOGRAM/URETERAL STENT PLACEMENT;  Surgeon: Festus Aloe, MD;  Location: [facility] ORS;  Service: Urology;  Laterality: Bilateral;  . ENDARTERECTOMY     bilat   . ESOPHAGOGASTRODUODENOSCOPY (EGD) WITH PROPOFOL N/A 08/26/2015   Procedure: ESOPHAGOGASTRODUODENOSCOPY (EGD) WITH PROPOFOL;  Surgeon: Clarene Essex, MD;  Location: [facility] ENDOSCOPY;  Service: Endoscopy;  Laterality: N/A;  . EXPLORATORY LAPAROTOMY    . LUMBAR FUSION    . PACEMAKER INSERTION    . ROTATOR CUFF REPAIR  bilat  . TONSILLECTOMY      Family History:  Family History  Problem Relation Age of Onset  . Heart disease Father 13    Social History:  reports that he quit smoking about 60 years ago. His smoking use included Cigarettes. He has a 2.50 pack-year smoking history. He has never used smokeless tobacco. He reports that he does not drink alcohol or use drugs.  Allergies:  Allergies  Allergen Reactions  . Penicillins Swelling    Has patient had a PCN reaction causing immediate rash, facial/tongue/throat swelling, SOB or lightheadedness with hypotension: Yes Has patient had a PCN reaction causing severe rash involving mucus membranes or skin necrosis: No Has  patient had a PCN reaction that required hospitalization No Has patient had a PCN reaction occurring within the last 10 years: No If all of the above answers are "NO", then may proceed with Cephalosporin use.   . Simvastatin Other (See Comments)    Muscle weakness in legs ?    Medications: Medications reviewed in Epic  Please HPI for pertinent positives, otherwise complete 10 system ROS negative.  Mallampati Score: MD Evaluation Airway: WNL Heart: WNL Abdomen: WNL Chest/ Lungs: WNL ASA  Classification: 3 Mallampati/Airway Score: Two  Physical Exam: BP (!) 113/51 (BP Location: Left Arm)   Pulse 65   Temp 97.5 F (36.4 C) (Oral)   Resp 18   SpO2 100%  There is no height or weight on file to calculate BMI. General: pleasant, WD, WN, elderly white male who is laying in bed in NAD HEENT: head is normocephalic, atraumatic.  Sclera are noninjected.  PERRL.  Ears and nose without any masses or lesions.  Mouth is pink and moist Heart: regular, rate, and rhythm.  Normal s1,s2. + murmur.  No obvious gallops, or rubs noted.  Palpable radial pulses bilaterally Lungs: CTAB, no wheezes, rhonchi, or rales noted.  Respiratory effort nonlabored Abd: soft, NT, ND, +BS, no masses, hernias, or organomegaly Psych: A&Ox3 with an appropriate affect.   Labs: Results for orders placed or performed during the hospital encounter of 05/13/16 (from the past 48 hour(s))  Basic metabolic panel     Status: Abnormal   Collection Time: 05/13/16  9:25 AM  Result Value Ref Range   Sodium 137 135 - 145 mmol/L   Potassium 4.2 3.5 - 5.1 mmol/L   Chloride 104 101 - 111 mmol/L   CO2 26 22 - 32 mmol/L   Glucose, Bld 116 (H) 65 - 99 mg/dL   BUN 33 (H) 6 - 20 mg/dL   Creatinine, Ser 1.75 (H) 0.61 - 1.24 mg/dL   Calcium 10.2 8.9 - 10.3 mg/dL   GFR calc non Af Amer 33 (L) >60 mL/min   GFR calc Af Amer 39 (L) >60 mL/min    Comment: (NOTE) The eGFR has been calculated using the CKD EPI equation. This  calculation has not been validated in all clinical situations. eGFR's persistently <60 mL/min signify possible Chronic Kidney Disease.    Anion gap 7 5 - 15  CBC with Differential/Platelet     Status: Abnormal   Collection Time: 05/13/16  9:25 AM  Result Value Ref Range   WBC 5.4 4.0 - 10.5 K/uL   RBC 3.47 (L) 4.22 - 5.81 MIL/uL   Hemoglobin 10.3 (L) 13.0 - 17.0 g/dL   HCT 31.7 (L) 39.0 - 52.0 %   MCV 91.4 78.0 - 100.0 fL   MCH 29.7 26.0 - 34.0 pg   MCHC 32.5 30.0 -  36.0 g/dL   RDW 15.0 11.5 - 15.5 %   Platelets 141 (L) 150 - 400 K/uL   Neutrophils Relative % 49 %   Neutro Abs 2.7 1.7 - 7.7 K/uL   Lymphocytes Relative 41 %   Lymphs Abs 2.3 0.7 - 4.0 K/uL   Monocytes Relative 8 %   Monocytes Absolute 0.4 0.1 - 1.0 K/uL   Eosinophils Relative 2 %   Eosinophils Absolute 0.1 0.0 - 0.7 K/uL   Basophils Relative 0 %   Basophils Absolute 0.0 0.0 - 0.1 K/uL  Protime-INR     Status: None   Collection Time: 05/13/16  9:25 AM  Result Value Ref Range   Prothrombin Time 14.8 11.4 - 15.2 seconds   INR 1.15     Imaging: No results found.  Assessment/Plan 1. Para aortic retroperitoneal mass, history of lymphoma -we will proceed with a biopsy of this mass today -the patient's coumadin has been held for 5 days and his INR is normal. -labs and vitals reviewed -Risks and Benefits discussed with the patient including, but not limited to bleeding, infection, damage to adjacent structures or low yield requiring additional tests. All of the patient's questions were answered, patient is agreeable to proceed. Consent signed and in chart.  Thank you for this interesting consult.  I greatly enjoyed meeting Edward Woodward and look forward to participating in their care.  A copy of this report was sent to the requesting provider on this date.  Electronically Signed: Henreitta Cea 05/13/2016, 10:45 AM   I spent a total of  30 Minutes   in face to face in clinical consultation, greater than  50% of which was counseling/coordinating care for para aortic retroperitoneal mass

## 2016-05-13 NOTE — Discharge Instructions (Signed)
Needle Biopsy, Care After °These instructions give you information about caring for yourself after your procedure. Your doctor may also give you more specific instructions. Call your doctor if you have any problems or questions after your procedure. °HOME CARE °· Rest as told by your doctor. °· Take medicines only as told by your doctor. °· There are many different ways to close and cover the biopsy site, including stitches (sutures), skin glue, and adhesive strips. Follow instructions from your doctor about: °¨ How to take care of your biopsy site. °¨ When and how you should change your bandage (dressing). °¨ When you should remove your dressing. °¨ Removing whatever was used to close your biopsy site. °· Check your biopsy site every day for signs of infection. Watch for: °¨ Redness, swelling, or pain. °¨ Fluid, blood, or pus. °GET HELP IF: °· You have a fever. °· You have redness, swelling, or pain at the biopsy site, and it lasts longer than a few days. °· You have fluid, blood, or pus coming from the biopsy site. °· You feel sick to your stomach (nauseous). °· You throw up (vomit). °GET HELP RIGHT AWAY IF: °· You are short of breath. °· You have trouble breathing. °· Your chest hurts. °· You feel dizzy or you pass out (faint). °· You have bleeding that does not stop with pressure or a bandage. °· You cough up blood. °· Your belly (abdomen) hurts. °  °This information is not intended to replace advice given to you by your health care provider. Make sure you discuss any questions you have with your health care provider. °  °Document Released: 06/25/2008 Document Revised: 11/27/2014 Document Reviewed: 07/09/2014 °Elsevier Interactive Patient Education ©2016 Elsevier Inc. ° ° ° °Moderate Conscious Sedation, Adult °Sedation is the use of medicines to promote relaxation and relieve discomfort and anxiety. Moderate conscious sedation is a type of sedation. Under moderate conscious sedation you are less alert than  normal but are still able to respond to instructions or stimulation. Moderate conscious sedation is used during short medical and dental procedures. It is milder than deep sedation or general anesthesia and allows you to return to your regular activities sooner. °LET YOUR HEALTH CARE PROVIDER KNOW ABOUT:  °· Any allergies you have. °· All medicines you are taking, including vitamins, herbs, eye drops, creams, and over-the-counter medicines. °· Use of steroids (by mouth or creams). °· Previous problems you or members of your family have had with the use of anesthetics. °· Any blood disorders you have. °· Previous surgeries you have had. °· Medical conditions you have. °· Possibility of pregnancy, if this applies. °· Use of cigarettes, alcohol, or illegal drugs. °RISKS AND COMPLICATIONS °Generally, this is a safe procedure. However, as with any procedure, problems can occur. Possible problems include: °· Oversedation. °· Trouble breathing on your own. You may need to have a breathing tube until you are awake and breathing on your own. °· Allergic reaction to any of the medicines used for the procedure. °BEFORE THE PROCEDURE °· You may have blood tests done. These tests can help show how well your kidneys and liver are working. They can also show how well your blood clots. °· A physical exam will be done.   °· Only take medicines as directed by your health care provider. You may need to stop taking medicines (such as blood thinners, aspirin, or nonsteroidal anti-inflammatory drugs) before the procedure.   °· Do not eat or drink at least 6 hours before the procedure or   as directed by your health care provider. °· Arrange for a responsible adult, family member, or friend to take you home after the procedure. He or she should stay with you for at least 24 hours after the procedure, until the medicine has worn off. °PROCEDURE  °· An intravenous (IV) catheter will be inserted into one of your veins. Medicine will be able to  flow directly into your body through this catheter. You may be given medicine through this tube to help prevent pain and help you relax. °· The medical or dental procedure will be done. °AFTER THE PROCEDURE °· You will stay in a recovery area until the medicine has worn off. Your blood pressure and pulse will be checked.   °·  Depending on the procedure you had, you may be allowed to go home when you can tolerate liquids and your pain is under control. °  °This information is not intended to replace advice given to you by your health care provider. Make sure you discuss any questions you have with your health care provider. °  °Document Released: 04/07/2001 Document Revised: 08/03/2014 Document Reviewed: 03/20/2013 °Elsevier Interactive Patient Education ©2016 Elsevier Inc. ° °

## 2016-05-13 NOTE — Progress Notes (Signed)
Paged MD to inquire when patient should resume his 2.5mg  of Coumadin. Dr. Kathlene Cote stated for him to resume this tomorrow (Thursday) at 6pm. Patient and wife made aware.

## 2016-05-15 ENCOUNTER — Encounter: Payer: Self-pay | Admitting: Oncology

## 2016-05-15 ENCOUNTER — Ambulatory Visit (HOSPITAL_BASED_OUTPATIENT_CLINIC_OR_DEPARTMENT_OTHER): Payer: Medicare Other | Admitting: Nurse Practitioner

## 2016-05-15 ENCOUNTER — Other Ambulatory Visit (HOSPITAL_BASED_OUTPATIENT_CLINIC_OR_DEPARTMENT_OTHER): Payer: Medicare Other

## 2016-05-15 ENCOUNTER — Telehealth: Payer: Self-pay

## 2016-05-15 VITALS — BP 124/66 | HR 84 | Temp 97.8°F | Resp 18 | Ht 70.0 in | Wt 200.7 lb

## 2016-05-15 DIAGNOSIS — R935 Abnormal findings on diagnostic imaging of other abdominal regions, including retroperitoneum: Secondary | ICD-10-CM

## 2016-05-15 DIAGNOSIS — C61 Malignant neoplasm of prostate: Secondary | ICD-10-CM

## 2016-05-15 DIAGNOSIS — Z8572 Personal history of non-Hodgkin lymphomas: Secondary | ICD-10-CM | POA: Diagnosis not present

## 2016-05-15 DIAGNOSIS — R590 Localized enlarged lymph nodes: Secondary | ICD-10-CM

## 2016-05-15 DIAGNOSIS — Z7901 Long term (current) use of anticoagulants: Secondary | ICD-10-CM | POA: Diagnosis not present

## 2016-05-15 DIAGNOSIS — N183 Chronic kidney disease, stage 3 unspecified: Secondary | ICD-10-CM

## 2016-05-15 DIAGNOSIS — I4891 Unspecified atrial fibrillation: Secondary | ICD-10-CM

## 2016-05-15 LAB — COMPREHENSIVE METABOLIC PANEL
ALBUMIN: 3.4 g/dL — AB (ref 3.5–5.0)
ALK PHOS: 72 U/L (ref 40–150)
ALT: 9 U/L (ref 0–55)
AST: 16 U/L (ref 5–34)
Anion Gap: 11 mEq/L (ref 3–11)
BUN: 29.9 mg/dL — AB (ref 7.0–26.0)
CALCIUM: 10.2 mg/dL (ref 8.4–10.4)
CO2: 23 mEq/L (ref 22–29)
CREATININE: 1.8 mg/dL — AB (ref 0.7–1.3)
Chloride: 108 mEq/L (ref 98–109)
EGFR: 34 mL/min/{1.73_m2} — ABNORMAL LOW (ref >90)
GLUCOSE: 98 mg/dL (ref 70–140)
Potassium: 4.4 mEq/L (ref 3.5–5.1)
Sodium: 142 mEq/L (ref 136–145)
TOTAL PROTEIN: 5.8 g/dL — AB (ref 6.4–8.3)
Total Bilirubin: 1.1 mg/dL (ref 0.20–1.20)

## 2016-05-15 LAB — CBC WITH DIFFERENTIAL/PLATELET
BASO%: 0.6 % (ref 0.0–2.0)
BASOS ABS: 0 10*3/uL (ref 0.0–0.1)
EOS%: 2.3 % (ref 0.0–7.0)
Eosinophils Absolute: 0.1 10*3/uL (ref 0.0–0.5)
HEMATOCRIT: 31.7 % — AB (ref 38.4–49.9)
HEMOGLOBIN: 10.3 g/dL — AB (ref 13.0–17.1)
LYMPH#: 1.8 10*3/uL (ref 0.9–3.3)
LYMPH%: 34.6 % (ref 14.0–49.0)
MCH: 29 pg (ref 27.2–33.4)
MCHC: 32.6 g/dL (ref 32.0–36.0)
MCV: 89.1 fL (ref 79.3–98.0)
MONO#: 0.5 10*3/uL (ref 0.1–0.9)
MONO%: 8.8 % (ref 0.0–14.0)
NEUT%: 53.7 % (ref 39.0–75.0)
NEUTROS ABS: 2.7 10*3/uL (ref 1.5–6.5)
Platelets: 143 10*3/uL (ref 140–400)
RBC: 3.55 10*6/uL — ABNORMAL LOW (ref 4.20–5.82)
RDW: 16.1 % — AB (ref 11.0–14.6)
WBC: 5.1 10*3/uL (ref 4.0–10.3)

## 2016-05-15 LAB — PROTIME-INR
INR: 1.1 — ABNORMAL LOW (ref 2.00–3.50)
PROTIME: 13.2 s (ref 10.6–13.4)

## 2016-05-15 NOTE — Telephone Encounter (Signed)
Called and informed patients wife to have patient hold coumadin 4 days prior to biopsy and resume night of biopsy.   Patients wife dropped off financial aid for [employer] for zytiga. Form filled out and signed, copy placed for pt to pick up and copy brought to [place] in financial counseling to be faxed.

## 2016-05-15 NOTE — Progress Notes (Signed)
Application for Zytiga completed by [employer] and signed by the doctor. Faxed to [employer]. Marland Kitchen

## 2016-05-15 NOTE — Progress Notes (Addendum)
[facility] OFFICE PROGRESS NOTE   Diagnosis:  Prostate cancer, lymphoma  INTERVAL HISTORY:   Edward Woodward returns as scheduled. He underwent biopsy of a retroperitoneal lymph node 05/13/2016. He continues to feel weak. No falls. No fever. His wife reports he is having mild night sweats.  Objective:  Vital signs in last 24 hours:  Blood pressure 124/66, pulse 84, temperature 97.8 F (36.6 C), temperature source Oral, resp. rate 18, height 5\' 10"  (1.778 m), weight 200 lb 11.2 oz (91 kg), SpO2 100 %.    HEENT: No thrush or ulcers.  Lymphatics: Question lymphadenopathy left low neck/scalene region. Resp: Rales at both lung bases. No respiratory distress. Cardio: Regular rate and rhythm. GI: Abdomen is soft and nontender. Vascular: Trace edema at the lower legs bilaterally left greater than right.    Lab Results:  Lab Results  Component Value Date   WBC 5.1 05/15/2016   HGB 10.3 (L) 05/15/2016   HCT 31.7 (L) 05/15/2016   MCV 89.1 05/15/2016   PLT 143 05/15/2016   NEUTROABS 2.7 05/15/2016    Imaging:  Ct Biopsy  Result Date: 05/13/2016 CLINICAL DATA:  Retroperitoneal lymphadenopathy in the abdomen and pelvis. The patient presents for biopsy. Previous placement of ureteral stents has been performed to treat bilateral hydronephrosis. EXAM: CT GUIDED CORE BIOPSY OF INTRA-ABDOMINAL RETROPERITONEAL LYMPH NODE ANESTHESIA/SEDATION: 2.0 mg IV Versed; 50 mcg IV Fentanyl Total Moderate Sedation Time:  55 minutes. The patient's level of consciousness and physiologic status were continuously monitored during the procedure by Radiology nursing. PROCEDURE: The procedure risks, benefits, and alternatives were explained to the patient. Questions regarding the procedure were encouraged and answered. The patient understands and consents to the procedure. A time-out was performed prior to initiating the procedure. The left trans lumbar region was prepped with chlorhexidine in a  sterile fashion, and a sterile drape was applied covering the operative field. A sterile gown and sterile gloves were used for the procedure. Local anesthesia was provided with 1% Lidocaine. CT was performed in a prone position. Under CT guidance, a 17 gauge trocar needle was advanced to the level of left-sided para-aortic retroperitoneal lymphadenopathy. Coaxial 18 gauge core biopsy samples were obtained. Three samples were submitted in saline. After the procedure a slurry of Gel-Foam pledgets was injected via the outer needle. COMPLICATIONS: None FINDINGS: Left para-aortic lymph node mass in the lower abdomen was targeted. Solid tissue was obtained. IMPRESSION: CT-guided core biopsy performed of left para-aortic retroperitoneal lymphadenopathy. Electronically Signed   By: Aletta Edouard M.D.   On: 05/13/2016 15:35    Medications: I have reviewed the patient's current medications.  Assessment/Plan: 1. Non-Hodgkin's lymphoma (follicular grade 3 lymphoma) - status post 6 cycles of Cytoxan/prednisone/rituximab 07/01/2007 through 10/21/2007. He remains in clinical remission. 2. CT chest 04/01/2015 with no lymphadenopathy  CT abdomen/pelvis 05/01/2015 with mild progression of abdomen/pelvic lymphadenopathy 3. Prostate cancer - he has been treated with hormonal therapy, the PSA was stable on 10/15/2014  PSA increased 04/26/2015   Bone scan 05/09/2015 with unchanged increased activity in the thoracic and lumbar spine felt to most likely be degenerative.   Initiation of Abiraterone and prednisone 05/11/2015. Normalization of the PSA on Abiraterone. 4. History of a normocytic anemia secondary to non-Hodgkin's lymphoma, chemotherapy, and renal insufficiency - progressive secondary to GI bleeding, stable 5. History of Mild thrombocytopenia  6. History of bilateral hydronephrosis. Renal ultrasound 09/26/2014 consistent with medical renal disease. No hydronephrosis. 7. Coronary artery disease - status  post coronary artery stent  placement. 8. History of noncalcified lung nodules. 9. History of severe neutropenia July 2009 - likely related to delayed toxicity from rituximab. 10. Macular degeneration. 11. Right middle lobe density on a CT of the chest 06/18/2010 measuring 2.5 x 1.6 cm with mildly increased metabolic activity (SUV max 2.5) on a PET scan 06/27/2010. The area of increased density was less confluent and appeared smaller on a restaging CT 09/08/2010. Right middle lobe "scarring" with no new or suspicious pulmonary nodule on a CT 08/04/2011. 12. Mild increased metabolic activity associated with several lymph nodes on the PET scan 06/27/2010 - likely related to lymphoma. No lymphadenopathy in the mediastinum or axillary areas on the CT 08/04/2011. 13. Right hip discomfort-he received a steroid injection by his primary physician without improvement. He saw Dr. Collier Salina and there was a question of a lytic process in the right pelvis. A bone scan on 09/21/2012 revealed no evidence of metastatic disease. 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 Renal insufficiency-progressive CT 01/12/2016 confirmed progressive hydronephrosis  Placement of bilateral ureter stents 04/10/2016 15. Left leg DVT and pulmonary embolism 04/01/2015-on Coumadin 16. Admission 08/23/2015 with severe anemia and GI bleeding  Upper and lower endoscopy 08/26/2015 without a clear source for bleeding identified 17. Leg edema/anasarca-improved with leg wrapping and diuretics 18. Anemia secondary to chronic disease, renal failure, and possibly lymphoma 19. 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   Biopsy retroperitoneal soft tissue 05/13/2016 20. History of elevated calcium-potentially hypercalcemia of malignancy   Disposition: Edward Woodward appears unchanged. The preliminary report on the  recent biopsy of retroperitoneal adenopathy shows lymphoma. A final report has not been issued. Dr. Benay Spice has spoken with the pathologist. We reviewed this information with Edward Woodward and his family. They understand the pathologist feels additional tissue is needed to make a definitive diagnosis. They would like to proceed with a biopsy of left low neck/scalene adenopathy.  Dr. Benay Spice discussed possible treatment with rituximab. We reviewed potential toxicities including an allergic reaction, infusion-related reaction, possible effect on the blood counts, reactivation of hepatitis B and leukoencephalitis.  We will refer him to have the biopsy done next week. He will return for a follow-up visit on 05/28/2016 to review the results. He understands to hold Coumadin 4 days prior to the biopsy and resume the night of the biopsy.  25 minutes were spent face-to-face at today's visit with the majority of that time involved in counseling/coordination of care.  Edward Woodward ANP/GNP-BC   05/15/2016  12:24 PM   This was a shared visit with Edward Woodward. Biopsy of the retroperitoneal soft tissue is suspicious for lymphoma. I discussed the case with Dr. Gari Crown. He reports additional tissue. Would be helpful to confirm the diagnosis. Edward Woodward has a palpable node in the left low cervical/scalene region. Edward Woodward and his family would like to arrive at a definitive diagnosis and consider systemic therapy. We discussed rituximab therapy.  We will refer him to interventional radiology for a biopsy of the left neck node.  [name], M.D.

## 2016-05-25 ENCOUNTER — Telehealth: Payer: Self-pay | Admitting: *Deleted

## 2016-05-25 NOTE — Telephone Encounter (Signed)
Called radiology schedulers to follow up on biopsy appt. Per Elmo Putt, Dr. Kathlene Cote wasn't sure pt needed another biopsy, schedulers were waiting for clarification. Reviewed with [name], NP: Dr. Benay Spice does want another biopsy for a definitive diagnosis, Dr. Kathlene Cote can call Dr. Benay Spice to discuss on 10/31 if needed. Made scheduler aware, they will contact pt with appt. Confirmed it is OK for pt to hold Coumadin x4 days prior to procedure as per note on 05/15/16.

## 2016-05-28 ENCOUNTER — Ambulatory Visit (HOSPITAL_BASED_OUTPATIENT_CLINIC_OR_DEPARTMENT_OTHER): Payer: Medicare Other

## 2016-05-28 ENCOUNTER — Telehealth: Payer: Self-pay | Admitting: Nurse Practitioner

## 2016-05-28 ENCOUNTER — Ambulatory Visit: Payer: Medicare Other | Admitting: Nurse Practitioner

## 2016-05-28 ENCOUNTER — Other Ambulatory Visit: Payer: Medicare Other

## 2016-05-28 ENCOUNTER — Telehealth: Payer: Self-pay

## 2016-05-28 DIAGNOSIS — N183 Chronic kidney disease, stage 3 unspecified: Secondary | ICD-10-CM

## 2016-05-28 DIAGNOSIS — C61 Malignant neoplasm of prostate: Secondary | ICD-10-CM | POA: Diagnosis not present

## 2016-05-28 DIAGNOSIS — Z8572 Personal history of non-Hodgkin lymphomas: Secondary | ICD-10-CM

## 2016-05-28 DIAGNOSIS — Z7901 Long term (current) use of anticoagulants: Secondary | ICD-10-CM

## 2016-05-28 DIAGNOSIS — R935 Abnormal findings on diagnostic imaging of other abdominal regions, including retroperitoneum: Secondary | ICD-10-CM

## 2016-05-28 DIAGNOSIS — R590 Localized enlarged lymph nodes: Secondary | ICD-10-CM | POA: Diagnosis not present

## 2016-05-28 DIAGNOSIS — I4891 Unspecified atrial fibrillation: Secondary | ICD-10-CM

## 2016-05-28 LAB — CBC WITH DIFFERENTIAL/PLATELET
BASO%: 0.7 % (ref 0.0–2.0)
Basophils Absolute: 0 10*3/uL (ref 0.0–0.1)
EOS%: 5.2 % (ref 0.0–7.0)
Eosinophils Absolute: 0.3 10*3/uL (ref 0.0–0.5)
HCT: 28.9 % — ABNORMAL LOW (ref 38.4–49.9)
HGB: 9.4 g/dL — ABNORMAL LOW (ref 13.0–17.1)
LYMPH#: 2.2 10*3/uL (ref 0.9–3.3)
LYMPH%: 34.6 % (ref 14.0–49.0)
MCH: 29.2 pg (ref 27.2–33.4)
MCHC: 32.4 g/dL (ref 32.0–36.0)
MCV: 89.8 fL (ref 79.3–98.0)
MONO#: 0.5 10*3/uL (ref 0.1–0.9)
MONO%: 8.1 % (ref 0.0–14.0)
NEUT#: 3.3 10*3/uL (ref 1.5–6.5)
NEUT%: 51.4 % (ref 39.0–75.0)
PLATELETS: 159 10*3/uL (ref 140–400)
RBC: 3.21 10*6/uL — AB (ref 4.20–5.82)
RDW: 16.7 % — AB (ref 11.0–14.6)
WBC: 6.5 10*3/uL (ref 4.0–10.3)

## 2016-05-28 LAB — COMPREHENSIVE METABOLIC PANEL
ALT: 12 U/L (ref 0–55)
ANION GAP: 5 meq/L (ref 3–11)
AST: 17 U/L (ref 5–34)
Albumin: 3.1 g/dL — ABNORMAL LOW (ref 3.5–5.0)
Alkaline Phosphatase: 66 U/L (ref 40–150)
BUN: 37.6 mg/dL — ABNORMAL HIGH (ref 7.0–26.0)
CHLORIDE: 110 meq/L — AB (ref 98–109)
CO2: 23 meq/L (ref 22–29)
Calcium: 10.1 mg/dL (ref 8.4–10.4)
Creatinine: 1.7 mg/dL — ABNORMAL HIGH (ref 0.7–1.3)
EGFR: 36 mL/min/{1.73_m2} — AB (ref >90)
GLUCOSE: 122 mg/dL (ref 70–140)
POTASSIUM: 3.6 meq/L (ref 3.5–5.1)
SODIUM: 138 meq/L (ref 136–145)
TOTAL PROTEIN: 5.7 g/dL — AB (ref 6.4–8.3)
Total Bilirubin: 0.46 mg/dL (ref 0.20–1.20)

## 2016-05-28 LAB — PROTIME-INR
INR: 1.3 — AB (ref 2.00–3.50)
Protime: 15.6 Seconds — ABNORMAL HIGH (ref 10.6–13.4)

## 2016-05-28 NOTE — Telephone Encounter (Signed)
Spoke with patient's daughter regarding labs from today. Notified her labs overall stable, hemoglobin down slightly compared to 2 weeks. Instructed to continue the same dose of Coumadin. We will repeat labs when he returns for the next office visit.

## 2016-05-28 NOTE — Telephone Encounter (Signed)
Called patients wife back, per [name], NP pt to try miralax. Patients wife informed this nurse they spoke with patients daughter and think pt needs to come in for lab work today to check his calcium d/t weakness. Spoke with MD and lab appt added back on for 145 today. Patients wife verbalized all understanding.

## 2016-05-28 NOTE — Telephone Encounter (Signed)
Called pt per MD request move appt from today 11/2 to after biopsy on 11/16, around 11/20. Called and spoke with patients wife. Appt to be moved to 11/20 at 145pm.  Mrs. Alix requested something for constipation for pt, he has tried milk of mag and cant get in the position for the enema. Informed her this nurse would discuss it with MD and call her back.

## 2016-06-01 ENCOUNTER — Ambulatory Visit (HOSPITAL_COMMUNITY): Payer: Medicare Other

## 2016-06-08 ENCOUNTER — Ambulatory Visit (HOSPITAL_BASED_OUTPATIENT_CLINIC_OR_DEPARTMENT_OTHER): Payer: Medicare Other

## 2016-06-08 ENCOUNTER — Ambulatory Visit (HOSPITAL_BASED_OUTPATIENT_CLINIC_OR_DEPARTMENT_OTHER): Payer: Medicare Other | Admitting: Nurse Practitioner

## 2016-06-08 ENCOUNTER — Telehealth: Payer: Self-pay | Admitting: *Deleted

## 2016-06-08 VITALS — BP 140/55 | HR 64 | Resp 16 | Ht 70.0 in | Wt 195.6 lb

## 2016-06-08 DIAGNOSIS — C8589 Other specified types of non-Hodgkin lymphoma, extranodal and solid organ sites: Secondary | ICD-10-CM

## 2016-06-08 DIAGNOSIS — L89153 Pressure ulcer of sacral region, stage 3: Secondary | ICD-10-CM | POA: Diagnosis not present

## 2016-06-08 DIAGNOSIS — C61 Malignant neoplasm of prostate: Secondary | ICD-10-CM

## 2016-06-08 DIAGNOSIS — Z7901 Long term (current) use of anticoagulants: Secondary | ICD-10-CM | POA: Diagnosis not present

## 2016-06-08 DIAGNOSIS — R300 Dysuria: Secondary | ICD-10-CM

## 2016-06-08 DIAGNOSIS — C8299 Follicular lymphoma, unspecified, extranodal and solid organ sites: Secondary | ICD-10-CM

## 2016-06-08 DIAGNOSIS — E860 Dehydration: Secondary | ICD-10-CM

## 2016-06-08 LAB — CBC WITH DIFFERENTIAL/PLATELET
BASO%: 0.4 % (ref 0.0–2.0)
Basophils Absolute: 0 10*3/uL (ref 0.0–0.1)
EOS%: 7.8 % — AB (ref 0.0–7.0)
Eosinophils Absolute: 0.7 10*3/uL — ABNORMAL HIGH (ref 0.0–0.5)
HCT: 32.2 % — ABNORMAL LOW (ref 38.4–49.9)
HEMOGLOBIN: 10.8 g/dL — AB (ref 13.0–17.1)
LYMPH%: 29.4 % (ref 14.0–49.0)
MCH: 30.2 pg (ref 27.2–33.4)
MCHC: 33.5 g/dL (ref 32.0–36.0)
MCV: 89.9 fL (ref 79.3–98.0)
MONO#: 0.8 10*3/uL (ref 0.1–0.9)
MONO%: 8.3 % (ref 0.0–14.0)
NEUT%: 54.1 % (ref 39.0–75.0)
NEUTROS ABS: 4.9 10*3/uL (ref 1.5–6.5)
Platelets: 149 10*3/uL (ref 140–400)
RBC: 3.58 10*6/uL — AB (ref 4.20–5.82)
RDW: 15.4 % — AB (ref 11.0–14.6)
WBC: 9 10*3/uL (ref 4.0–10.3)
lymph#: 2.6 10*3/uL (ref 0.9–3.3)

## 2016-06-08 LAB — URINALYSIS, MICROSCOPIC - CHCC
BILIRUBIN (URINE): NEGATIVE
Glucose: NEGATIVE mg/dL
Ketones: NEGATIVE mg/dL
NITRITE: NEGATIVE
Protein: 30 mg/dL
SPECIFIC GRAVITY, URINE: 1.015 (ref 1.003–1.035)
UROBILINOGEN UR: 0.2 mg/dL (ref 0.2–1)
pH: 6 (ref 4.6–8.0)

## 2016-06-08 LAB — COMPREHENSIVE METABOLIC PANEL
ALBUMIN: 3.2 g/dL — AB (ref 3.5–5.0)
ALK PHOS: 84 U/L (ref 40–150)
ALT: 13 U/L (ref 0–55)
AST: 22 U/L (ref 5–34)
Anion Gap: 11 mEq/L (ref 3–11)
BILIRUBIN TOTAL: 0.69 mg/dL (ref 0.20–1.20)
BUN: 37.8 mg/dL — AB (ref 7.0–26.0)
CO2: 21 meq/L — AB (ref 22–29)
Calcium: 11.4 mg/dL — ABNORMAL HIGH (ref 8.4–10.4)
Chloride: 107 mEq/L (ref 98–109)
Creatinine: 1.9 mg/dL — ABNORMAL HIGH (ref 0.7–1.3)
EGFR: 32 mL/min/{1.73_m2} — AB (ref >90)
GLUCOSE: 118 mg/dL (ref 70–140)
Potassium: 3.8 mEq/L (ref 3.5–5.1)
SODIUM: 139 meq/L (ref 136–145)
TOTAL PROTEIN: 6 g/dL — AB (ref 6.4–8.3)

## 2016-06-08 MED ORDER — SODIUM CHLORIDE 0.9 % IV SOLN
INTRAVENOUS | Status: AC
  Administered 2016-06-08: 15:00:00 via INTRAVENOUS

## 2016-06-08 MED ORDER — SODIUM CHLORIDE 0.9 % IV SOLN
90.0000 mg | Freq: Once | INTRAVENOUS | Status: AC
  Administered 2016-06-08: 90 mg via INTRAVENOUS
  Filled 2016-06-08: qty 10

## 2016-06-08 MED ORDER — ZOLEDRONIC ACID 4 MG/100ML IV SOLN
4.0000 mg | Freq: Once | INTRAVENOUS | Status: DC
  Filled 2016-06-08: qty 100

## 2016-06-08 NOTE — Patient Instructions (Signed)
Pamidronate injection What is this medicine? PAMIDRONATE (pa mi DROE nate) slows calcium loss from bones. It is used to treat high calcium blood levels from cancer or Paget's disease. It is also used to treat bone pain and prevent fractures from certain cancers that have spread to the bone. This medicine may be used for other purposes; ask your health care provider or pharmacist if you have questions. What should I tell my health care provider before I take this medicine? They need to know if you have any of these conditions: -aspirin-sensitive asthma -dental disease -kidney disease -an unusual or allergic reaction to pamidronate, other medicines, foods, dyes, or preservatives -pregnant or trying to get pregnant -breast-feeding How should I use this medicine? This medicine is for infusion into a vein. It is given by a health care professional in a hospital or clinic setting. Talk to your pediatrician regarding the use of this medicine in children. This medicine is not approved for use in children. Overdosage: If you think you have taken too much of this medicine contact a poison control center or emergency room at once. NOTE: This medicine is only for you. Do not share this medicine with others. What if I miss a dose? This does not apply. What may interact with this medicine? -certain antibiotics given by injection -medicines for inflammation or pain like ibuprofen, naproxen -some diuretics like bumetanide, furosemide -cyclosporine -parathyroid hormone -tacrolimus -teriparatide -thalidomide This list may not describe all possible interactions. Give your health care provider a list of all the medicines, herbs, non-prescription drugs, or dietary supplements you use. Also tell them if you smoke, drink alcohol, or use illegal drugs. Some items may interact with your medicine. What should I watch for while using this medicine? Visit your doctor or health care professional for regular  checkups. It may be some time before you see the benefit from this medicine. Do not stop taking your medicine unless your doctor tells you to. Your doctor may order blood tests or other tests to see how you are doing. Women should inform their doctor if they wish to become pregnant or think they might be pregnant. There is a potential for serious side effects to an unborn child. Talk to your health care professional or pharmacist for more information. You should make sure that you get enough calcium and vitamin D while you are taking this medicine. Discuss the foods you eat and the vitamins you take with your health care professional. Some people who take this medicine have severe bone, joint, and/or muscle pain. This medicine may also increase your risk for a broken thigh bone. Tell your doctor right away if you have pain in your upper leg or groin. Tell your doctor if you have any pain that does not go away or that gets worse. What side effects may I notice from receiving this medicine? Side effects that you should report to your doctor or health care professional as soon as possible: -allergic reactions like skin rash, itching or hives, swelling of the face, lips, or tongue -black or tarry stools -changes in vision -eye inflammation, pain -high blood pressure -jaw pain, especially burning or cramping -muscle weakness -numb, tingling pain -swelling of feet or hands -trouble passing urine or change in the amount of urine -unable to move easily Side effects that usually do not require medical attention (report to your doctor or health care professional if they continue or are bothersome): -bone, joint, or muscle pain -constipation -dizzy, drowsy -fever -headache -loss of  appetite -nausea, vomiting -pain at site where injected This list may not describe all possible side effects. Call your doctor for medical advice about side effects. You may report side effects to FDA at  1-800-FDA-1088. Where should I keep my medicine? This drug is given in a hospital or clinic and will not be stored at home. NOTE: This sheet is a summary. It may not cover all possible information. If you have questions about this medicine, talk to your doctor, pharmacist, or health care provider.    2016, [employer]. (2011-01-09 08:49:49)  

## 2016-06-08 NOTE — Telephone Encounter (Signed)
"  I'm calling for my husband.  He needs to be seen today.  The past week he's progressively worse.  He drinks one protein shake daily with three bites of food.  I force him to drink and every evening he's asking for something to drink.  He's weak, almost too weak to walk.  I need to know does he need the biopsy Thursday because he's too weak for the biopsy." Denies "fever, constipated currently.  Last BM maybe three days ago.  No N/V but did say his stomach is not well this morning because he's hungry.  Verbal order received and read back from Dr. Benay Spice for patient to come in for labs and see [facility]. Called wife who will get him here within an hour.

## 2016-06-09 ENCOUNTER — Other Ambulatory Visit: Payer: Self-pay

## 2016-06-09 ENCOUNTER — Encounter (HOSPITAL_COMMUNITY): Payer: Self-pay

## 2016-06-09 ENCOUNTER — Other Ambulatory Visit: Payer: Self-pay | Admitting: Nurse Practitioner

## 2016-06-09 ENCOUNTER — Encounter: Payer: Self-pay | Admitting: Nurse Practitioner

## 2016-06-09 ENCOUNTER — Emergency Department (HOSPITAL_COMMUNITY)
Admission: EM | Admit: 2016-06-09 | Discharge: 2016-06-09 | Disposition: A | Discharge status: Home / Self Care | Payer: Medicare Other | Source: Home / Self Care | Attending: Emergency Medicine | Admitting: Emergency Medicine

## 2016-06-09 ENCOUNTER — Emergency Department (HOSPITAL_COMMUNITY): Payer: Medicare Other

## 2016-06-09 DIAGNOSIS — N183 Chronic kidney disease, stage 3 (moderate): Secondary | ICD-10-CM

## 2016-06-09 DIAGNOSIS — I2510 Atherosclerotic heart disease of native coronary artery without angina pectoris: Secondary | ICD-10-CM | POA: Insufficient documentation

## 2016-06-09 DIAGNOSIS — Z8572 Personal history of non-Hodgkin lymphomas: Secondary | ICD-10-CM

## 2016-06-09 DIAGNOSIS — I5032 Chronic diastolic (congestive) heart failure: Secondary | ICD-10-CM

## 2016-06-09 DIAGNOSIS — Z951 Presence of aortocoronary bypass graft: Secondary | ICD-10-CM

## 2016-06-09 DIAGNOSIS — Z7901 Long term (current) use of anticoagulants: Secondary | ICD-10-CM | POA: Insufficient documentation

## 2016-06-09 DIAGNOSIS — Z87891 Personal history of nicotine dependence: Secondary | ICD-10-CM

## 2016-06-09 DIAGNOSIS — R0789 Other chest pain: Secondary | ICD-10-CM | POA: Diagnosis not present

## 2016-06-09 DIAGNOSIS — R1013 Epigastric pain: Secondary | ICD-10-CM

## 2016-06-09 DIAGNOSIS — Z8546 Personal history of malignant neoplasm of prostate: Secondary | ICD-10-CM

## 2016-06-09 DIAGNOSIS — R079 Chest pain, unspecified: Secondary | ICD-10-CM | POA: Diagnosis not present

## 2016-06-09 DIAGNOSIS — L89153 Pressure ulcer of sacral region, stage 3: Secondary | ICD-10-CM | POA: Insufficient documentation

## 2016-06-09 DIAGNOSIS — I252 Old myocardial infarction: Secondary | ICD-10-CM | POA: Insufficient documentation

## 2016-06-09 DIAGNOSIS — C8299 Follicular lymphoma, unspecified, extranodal and solid organ sites: Secondary | ICD-10-CM

## 2016-06-09 DIAGNOSIS — E860 Dehydration: Secondary | ICD-10-CM | POA: Insufficient documentation

## 2016-06-09 DIAGNOSIS — I130 Hypertensive heart and chronic kidney disease with heart failure and stage 1 through stage 4 chronic kidney disease, or unspecified chronic kidney disease: Secondary | ICD-10-CM

## 2016-06-09 DIAGNOSIS — Z955 Presence of coronary angioplasty implant and graft: Secondary | ICD-10-CM | POA: Insufficient documentation

## 2016-06-09 LAB — URINE CULTURE

## 2016-06-09 LAB — BASIC METABOLIC PANEL
ANION GAP: 7 (ref 5–15)
BUN: 33 mg/dL — ABNORMAL HIGH (ref 6–20)
CO2: 21 mmol/L — ABNORMAL LOW (ref 22–32)
Calcium: 11.2 mg/dL — ABNORMAL HIGH (ref 8.9–10.3)
Chloride: 110 mmol/L (ref 101–111)
Creatinine, Ser: 1.91 mg/dL — ABNORMAL HIGH (ref 0.61–1.24)
GFR calc Af Amer: 35 mL/min — ABNORMAL LOW (ref >60)
GFR, EST NON AFRICAN AMERICAN: 30 mL/min — AB (ref >60)
Glucose, Bld: 118 mg/dL — ABNORMAL HIGH (ref 65–99)
POTASSIUM: 3.5 mmol/L (ref 3.5–5.1)
SODIUM: 138 mmol/L (ref 135–145)

## 2016-06-09 LAB — CBC
HEMATOCRIT: 32.5 % — AB (ref 39.0–52.0)
HEMOGLOBIN: 10.6 g/dL — AB (ref 13.0–17.0)
MCH: 29.3 pg (ref 26.0–34.0)
MCHC: 32.6 g/dL (ref 30.0–36.0)
MCV: 89.8 fL (ref 78.0–100.0)
Platelets: 162 10*3/uL (ref 150–400)
RBC: 3.62 MIL/uL — AB (ref 4.22–5.81)
RDW: 15.4 % (ref 11.5–15.5)
WBC: 8.3 10*3/uL (ref 4.0–10.5)

## 2016-06-09 LAB — I-STAT TROPONIN, ED
TROPONIN I, POC: 0.03 ng/mL (ref 0.00–0.08)
Troponin i, poc: 0.03 ng/mL (ref 0.00–0.08)

## 2016-06-09 LAB — HEPATIC FUNCTION PANEL
ALBUMIN: 3.3 g/dL — AB (ref 3.5–5.0)
ALT: 14 U/L — AB (ref 17–63)
AST: 28 U/L (ref 15–41)
Alkaline Phosphatase: 73 U/L (ref 38–126)
BILIRUBIN INDIRECT: 0.4 mg/dL (ref 0.3–0.9)
Bilirubin, Direct: 0.2 mg/dL (ref 0.1–0.5)
TOTAL PROTEIN: 5.4 g/dL — AB (ref 6.5–8.1)
Total Bilirubin: 0.6 mg/dL (ref 0.3–1.2)

## 2016-06-09 LAB — BRAIN NATRIURETIC PEPTIDE: B Natriuretic Peptide: 133.5 pg/mL — ABNORMAL HIGH (ref 0.0–100.0)

## 2016-06-09 LAB — LIPASE, BLOOD: Lipase: 21 U/L (ref 11–51)

## 2016-06-09 MED ORDER — SODIUM CHLORIDE 0.9 % IV BOLUS (SEPSIS)
500.0000 mL | Freq: Once | INTRAVENOUS | Status: AC
  Administered 2016-06-09: 500 mL via INTRAVENOUS

## 2016-06-09 NOTE — Progress Notes (Signed)
SYMPTOM MANAGEMENT CLINIC    Chief Complaint: Hypercalcemia, dehydration  HPI:  Edward Woodward 82 y.o. male diagnosed with probable lymphoma.  Has not initiating any therapy as of yet; but the plan is for the patient to possibly initiate rituximab/bendamustine chemotherapy regimen next week.    No history exists.    Review of Systems  Constitutional: Positive for malaise/fatigue and weight loss.  Neurological: Positive for weakness.       Report of some confusion  All other systems reviewed and are negative.   Past Medical History:  Diagnosis Date  . AMD (age-related macular degeneration), bilateral   . Anemia   . Anginal pain ([place])    pts wife states pt had to use nitroglycerin this am 04/09/2016  . CAD (coronary artery disease)     NYHA Class 1B, CABG 1985  . CHF (congestive heart failure) ([place])   . Collagen vascular disease ([place])   . Collapsed lung    right   . Complication of anesthesia    pts wife states pt gets confused   . Degeneration of lumbar or lumbosacral intervertebral disc   . Depressive disorder, not elsewhere classified   . Dysrhythmia   . GERD (gastroesophageal reflux disease)   . History of blood transfusion   . History of hiatal hernia   . HTN (hypertension)   . Hypercholesteremia   . Lower leg edema    bilat  . Multiple falls   . Myocardial infarction   . Neuromuscular disorder (HCC)    neuropathy  . Neuropathy ([place])   . Non Hodgkin's lymphoma ([place])   . OSA on CPAP   . PE (pulmonary embolism) 03/2015  . Presence of permanent cardiac pacemaker   . Prostate cancer ([place])   . Shortness of breath dyspnea    with exertion   . Sinoatrial node dysfunction (HCC)   . Ulcers of both lower legs (HCC)    history of   . Urinary incontinence     Past Surgical History:  Procedure Laterality Date  . APPENDECTOMY    . CARDIAC CATHETERIZATION N/A 08/27/2015   Procedure: Left Heart Cath and Cors/Grafts Angiography;  Surgeon: Belva Crome, MD;   Location: [place] [facility];  Service: Cardiovascular;  Laterality: N/A;  . COLONOSCOPY N/A 08/26/2015   Procedure: COLONOSCOPY;  Surgeon: Clarene Essex, MD;  Location: [facility] ENDOSCOPY;  Service: Endoscopy;  Laterality: N/A;  . CORONARY ANGIOPLASTY WITH STENT PLACEMENT     s/p bare metal stent implant SVG-diagonal 11/23/05, s/p BM stent implantation SVG diagonal 10/22/06 (feeds LAD), and 05/2010 with DES to left circumflex  . CORONARY ARTERY BYPASS GRAFT    . CYSTOSCOPY W/ URETERAL STENT PLACEMENT Bilateral 04/10/2016   Procedure: CYSTOSCOPY WITH RETROGRADE PYELOGRAM/URETERAL STENT PLACEMENT;  Surgeon: Festus Aloe, MD;  Location: [facility] ORS;  Service: Urology;  Laterality: Bilateral;  . ENDARTERECTOMY     bilat   . ESOPHAGOGASTRODUODENOSCOPY (EGD) WITH PROPOFOL N/A 08/26/2015   Procedure: ESOPHAGOGASTRODUODENOSCOPY (EGD) WITH PROPOFOL;  Surgeon: Clarene Essex, MD;  Location: [facility];  Service: Endoscopy;  Laterality: N/A;  . EXPLORATORY LAPAROTOMY    . LUMBAR FUSION    . PACEMAKER INSERTION    . ROTATOR CUFF REPAIR     bilat  . TONSILLECTOMY      has Nodular lymphoma of extranodal and/or solid organ site [facility]); Peripheral vascular disease ([place]); Atherosclerosis of coronary artery bypass graft of native heart without angina pectoris; Hyperlipidemia; Essential hypertension, benign; Dyspnea; OSA on CPAP; Sinus node dysfunction  chronotropic incompetence; Cardiac pacemaker -[employer]; Atrial fibrillation ([place]); Pulmonary embolus ([place]); CKD (chronic kidney disease), stage III; Chronic anemia; Acute respiratory failure with hypoxia ([place]); Encounter for chemotherapy management; Chest pain; Chronic diastolic heart failure ([place]); Pain in the chest; NSTEMI (non-ST elevated myocardial infarction) ([place]); Iron deficiency anemia due to chronic blood loss; Dehydration; Long term current use of anticoagulant therapy; Peripheral edema; Rectal bleeding; Prostate cancer ([place]); Falls; Failure to thrive in adult;  Retroperitoneal mass; Hydronephrosis; AKI (acute kidney injury) ([place]); Weakness; Supratherapeutic INR; Encephalopathy acute; Palliative care encounter; Weakness generalized; Myoclonus; Gait instability; Hypercalcemia; and Sacral decubitus ulcer, stage III ([place]) on his problem list.    is allergic to penicillins and simvastatin.    Medication List       Accurate as of 06/08/16 11:59 PM. Always use your most recent med list.          abiraterone Acetate 250 MG tablet Commonly known as:  ZYTIGA Take 4 tablets (1,000 mg total) by mouth daily. Take on an empty stomach 1 hour before or 2 hours after a meal   acetaminophen 500 MG tablet Commonly known as:  TYLENOL Take 1,000 mg by mouth every 6 (six) hours as needed for mild pain.   benzocaine 10 % mucosal gel Commonly known as:  ORAJEL Use as directed in the mouth or throat 4 (four) times daily as needed for mouth pain.   citalopram 20 MG tablet Commonly known as:  CELEXA Take 20 mg by mouth daily.   clonazePAM 0.25 MG disintegrating tablet Commonly known as:  KLONOPIN Take 0.5 tablets (0.125 mg total) by mouth 2 (two) times daily.   fluticasone 50 MCG/ACT nasal spray Commonly known as:  FLONASE Place 1 spray into both nostrils daily.   isosorbide mononitrate 30 MG 24 hr tablet Commonly known as:  IMDUR TAKE ONE TABLET BY MOUTH ONCE DAILY   LIPITOR 40 MG tablet Generic drug:  atorvastatin Take 40 mg by mouth every other day.   Lutein-Zeaxanthin 25-5 MG Caps Take 1 tablet by mouth daily.   magnesium hydroxide 400 MG/5ML suspension Commonly known as:  MILK OF MAGNESIA Take 5 mLs by mouth daily as needed for mild constipation.   Melatonin 3 MG Tabs Take 3 mg by mouth at bedtime.   metoprolol succinate 50 MG 24 hr tablet Commonly known as:  TOPROL-XL Take 1 tablet (50 mg total) by mouth daily. Take with or immediately following a meal.   nitroGLYCERIN 0.4 MG/SPRAY spray Commonly known as:  NITROLINGUAL Place 1 spray  under the tongue every 5 (five) minutes x 3 doses as needed for chest pain.   predniSONE 5 MG tablet Commonly known as:  DELTASONE TAKE ONE TABLET BY MOUTH ONCE DAILY   SYSTANE 0.4-0.3 % Soln Generic drug:  Polyethyl Glycol-Propyl Glycol Place 1 drop into both eyes at bedtime.   torsemide 20 MG tablet Commonly known as:  DEMADEX Take 10 mg by mouth 2 (two) times daily.   Vitamin B-12 CR 1000 MCG Tbcr Take 1 tablet by mouth daily.   warfarin 5 MG tablet Commonly known as:  COUMADIN Take one tablet (1m) PO every MWF. half tablet (2.5 mg)all other days. Or as directed by MD        PHYSICAL EXAMINATION  Oncology Vitals 06/09/2016 06/08/2016  Height 178 cm 178 cm  Weight 89.359 kg 88.724 kg  Weight (lbs) 197 lbs 195 lbs 10 oz  BMI (kg/m2) 28.27 kg/m2 28.07 kg/m2  Temp 97.6 (No Data)  Pulse 81 64  Resp 22 16  Resp (Historical as of 02/25/12) - -  SpO2 94 98  BSA (m2) 2.1 m2 2.09 m2   BP Readings from Last 2 Encounters:  06/09/16 (!) 133/53  06/08/16 (!) 140/55    Physical Exam  Constitutional: He is oriented to person, place, and time. Vital signs are normal. He appears malnourished and dehydrated. He appears unhealthy. He appears cachectic.  Patient appears tired, weak, and ill.  HENT:  Head: Normocephalic and atraumatic.  Mouth/Throat: Oropharynx is clear and moist.  Eyes: Conjunctivae and EOM are normal. Pupils are equal, round, and reactive to light. Right eye exhibits no discharge. Left eye exhibits no discharge. No scleral icterus.  Neck: Normal range of motion. Neck supple. No JVD present. No tracheal deviation present. No thyromegaly present.  Cardiovascular: Normal rate, regular rhythm, normal heart sounds and intact distal pulses.   Pulmonary/Chest: Effort normal and breath sounds normal. No respiratory distress. He has no wheezes. He has no rales. He exhibits no tenderness.  Abdominal: Soft. Bowel sounds are normal. He exhibits no distension and no mass. There  is no tenderness. There is no rebound and no guarding.  Musculoskeletal: Normal range of motion. He exhibits no edema, tenderness or deformity.  Lymphadenopathy:    He has no cervical adenopathy.  Neurological: He is alert and oriented to person, place, and time.  Patient appears intact and answering all questions appropriately.  However, patient does require full assistance with any ambulation whatsoever.  Skin: Skin is warm and dry. No rash noted. No erythema. There is pallor.  Approximate 3 cm in diameter sacral decubitus to bilateral buttocks.  Psychiatric:  Flat affect.  Nursing note and vitals reviewed.   LABORATORY DATA:. Appointment on 06/08/2016  Component Date Value Ref Range Status  . WBC 06/08/2016 9.0  4.0 - 10.3 10e3/uL Final  . NEUT# 06/08/2016 4.9  1.5 - 6.5 10e3/uL Final  . HGB 06/08/2016 10.8* 13.0 - 17.1 g/dL Final  . HCT 06/08/2016 32.2* 38.4 - 49.9 % Final  . Platelets 06/08/2016 149  140 - 400 10e3/uL Final  . MCV 06/08/2016 89.9  79.3 - 98.0 fL Final  . MCH 06/08/2016 30.2  27.2 - 33.4 pg Final  . MCHC 06/08/2016 33.5  32.0 - 36.0 g/dL Final  . RBC 06/08/2016 3.58* 4.20 - 5.82 10e6/uL Final  . RDW 06/08/2016 15.4* 11.0 - 14.6 % Final  . lymph# 06/08/2016 2.6  0.9 - 3.3 10e3/uL Final  . MONO# 06/08/2016 0.8  0.1 - 0.9 10e3/uL Final  . Eosinophils Absolute 06/08/2016 0.7* 0.0 - 0.5 10e3/uL Final  . Basophils Absolute 06/08/2016 0.0  0.0 - 0.1 10e3/uL Final  . NEUT% 06/08/2016 54.1  39.0 - 75.0 % Final  . LYMPH% 06/08/2016 29.4  14.0 - 49.0 % Final  . MONO% 06/08/2016 8.3  0.0 - 14.0 % Final  . EOS% 06/08/2016 7.8* 0.0 - 7.0 % Final  . BASO% 06/08/2016 0.4  0.0 - 2.0 % Final  . Sodium 06/08/2016 139  136 - 145 mEq/L Final  . Potassium 06/08/2016 3.8  3.5 - 5.1 mEq/L Final  . Chloride 06/08/2016 107  98 - 109 mEq/L Final  . CO2 06/08/2016 21* 22 - 29 mEq/L Final  . Glucose 06/08/2016 118  70 - 140 mg/dl Final  . BUN 06/08/2016 37.8* 7.0 - 26.0 mg/dL Final    . Creatinine 06/08/2016 1.9* 0.7 - 1.3 mg/dL Final  . Total Bilirubin 06/08/2016 0.69  0.20 - 1.20 mg/dL Final  . Alkaline  Phosphatase 06/08/2016 84  40 - 150 U/L Final  . AST 06/08/2016 22  5 - 34 U/L Final  . ALT 06/08/2016 13  0 - 55 U/L Final  . Total Protein 06/08/2016 6.0* 6.4 - 8.3 g/dL Final  . Albumin 06/08/2016 3.2* 3.5 - 5.0 g/dL Final  . Calcium 06/08/2016 11.4* 8.4 - 10.4 mg/dL Final  . Anion Gap 06/08/2016 11  3 - 11 mEq/L Final  . EGFR 06/08/2016 32* >90 ml/min/1.73 m2 Final  . Glucose 06/08/2016 Negative  Negative mg/dL Final  . Bilirubin (Urine) 06/08/2016 Negative  Negative Final  . Ketones 06/08/2016 Negative  Negative mg/dL Final  . Specific Gravity, Urine 06/08/2016 1.015  1.003 - 1.035 Final  . Blood 06/08/2016 Large  Negative Final  . pH 06/08/2016 6.0  4.6 - 8.0 Final  . Protein 06/08/2016 30  Negative- <30 mg/dL Final  . Urobilinogen, UR 06/08/2016 0.2  0.2 - 1 mg/dL Final  . Nitrite 06/08/2016 Negative  Negative Final  . Leukocyte Esterase 06/08/2016 Trace  Negative Final  . RBC / HPF 06/08/2016 TNTC  0 - 2 Final  . WBC, UA 06/08/2016 3-6  0 - 2 Final  . Bacteria, UA 06/08/2016 Moderate  Negative- Trace Final  . Epithelial Cells 06/08/2016 Few  Negative- Few Final  . Amorphous, UA 06/08/2016 Small  Negative- Small Final    RADIOGRAPHIC STUDIES: No results found.  ASSESSMENT/PLAN:    Sacral decubitus ulcer, stage III ([place]) Patient has a stage III decubitus ulcer to his bilateral buttocks.  He states the area is tender.  Brief exam today revealed a stage III decubitus ulcer to bilateral buttocks approximately 3 cm in diameter with no evidence of active infection.  Both patient and his wife were advised that area should be kept clean and dry.  He continues to attend-type barrier cream to the site.  Also, patient was recommended to avoid sitting directly on the sacral region; and to instead props from side to side to allow for healing.  Also, patient will be  referred to the wound care clinic for further evaluation and management of decubitus area.  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  Patient was scheduled for additional biopsy this coming Thursday, 06/11/2016; but Dr. Benay Spice has made the decision to hold any further biopsy procedures.  Instead-will reevaluate patient later this week; and see if he is strong enough to proceed with planned Rituxan/bendamustine chemotherapy regimen.  Patient already has both a lab and visit appointment scheduled for 06/15/2016.  Long term current use of anticoagulant therapy Patient has a history of pulmonary embolism in the past; and continues to take Coumadin as directed.  Hypercalcemia Patient presented to the [place] today with complaint of increased generalized weakness and some confusion as well.  Corrected calcium with labs today was 12.04.  Reviewed all with Dr. Benay Spice; and then patient was given pamidronate 90 mg IV while at the [facility] today.  Patient also appeared dehydrated; and received additional IV fluid rehydration as well.  The plan is for the patient to return either Wednesday or Thursday this week for recheck.  Dehydration Patient presented to the [place] today with complaint of increased weakness and some confusion.  Patient was found to have an elevated calcium at 12.04 corrected value; and was also dehydrated.  Patient received calcium correction and IV fluid rehydration today.  Also, was noted that patient's creatinine had increased to 1.9 as well.  The plan is for the patient to return either Wednesday or Thursday this week for recheck.  Was also advised to go directly to the emergency department for any worsening symptoms whatsoever.   Patient stated understanding of all instructions; and was in agreement with this plan of care. The patient knows to call the clinic with any problems, questions or  concerns.   Total time spent with patient was 40 minutes;  with greater than 75 percent of that time spent in face to face counseling regarding patient's symptoms,  and coordination of care and follow up.  Disclaimer:This dictation was prepared with Dragon/digital dictation along with [employer]. Any transcriptional errors that result from this process are unintentional.  [name], NP 06/09/2016  This was a shared visit with [name]. Mr. Kittleson presents with failure to thrive and altered mental status. He has recurrent hypercalcemia. He will be treated with intravenous fluids and pamidronate today.  I discussed the situation with his wife and daughter. We decided to cancel the planned cervical lymph node biopsy. We will consider treating the lymphoma with bendamustine/rituximab. This will depend on his performance status after treatment of the hypercalcemia.  [name], M.D.

## 2016-06-09 NOTE — Assessment & Plan Note (Signed)
Patient has a stage III decubitus ulcer to his bilateral buttocks.  He states the area is tender.  Brief exam today revealed a stage III decubitus ulcer to bilateral buttocks approximately 3 cm in diameter with no evidence of active infection.  Both patient and his wife were advised that area should be kept clean and dry.  He continues to attend-type barrier cream to the site.  Also, patient was recommended to avoid sitting directly on the sacral region; and to instead props from side to side to allow for healing.  Also, patient will be referred to the wound care clinic for further evaluation and management of decubitus area.

## 2016-06-09 NOTE — ED Triage Notes (Signed)
Pt. CO of chest pain that started this AM that resolved with 2 nitro at home. EMS administered 4ASA. No SOB, Dizziness, N/V.  Hy of 11 stents, 1 CABG, non Hodgkins Lymphoma

## 2016-06-09 NOTE — Assessment & Plan Note (Signed)
Patient presented to the [place] today with complaint of increased generalized weakness and some confusion as well.  Corrected calcium with labs today was 12.04.  Reviewed all with Dr. Benay Spice; and then patient was given pamidronate 90 mg IV while at the [place] today.  Patient also appeared dehydrated; and received additional IV fluid rehydration as well.  The plan is for the patient to return either Wednesday or Thursday this week for recheck.

## 2016-06-09 NOTE — Assessment & Plan Note (Signed)
Patient has a history of pulmonary embolism in the past; and continues to take Coumadin as directed.

## 2016-06-09 NOTE — Assessment & Plan Note (Signed)
Patient has been diagnosed with probable lymphoma.  Patient was scheduled for additional biopsy this coming Thursday, 06/11/2016; but Dr. Benay Spice has made the decision to hold any further biopsy procedures.  Instead-will reevaluate patient later this week; and see if he is strong enough to proceed with planned Rituxan/bendamustine chemotherapy regimen.  Patient already has both a lab and visit appointment scheduled for 06/15/2016.

## 2016-06-09 NOTE — ED Notes (Signed)
ED Provider at bedside. 

## 2016-06-09 NOTE — ED Notes (Signed)
Pt had a 5 beat run of Vtach; pt alert, c/o sacral pain. Dr. Leonette Monarch made aware.

## 2016-06-09 NOTE — ED Provider Notes (Signed)
[facility] DEPT Provider Note   CSN: [id number] Arrival date & time: 06/09/16  1230     History   Chief Complaint Chief Complaint  Patient presents with  . Chest Pain    HPI Edward Woodward is a 82 y.o. male.  HPI 82 year old male with an extensive past medical history listed below including CAD with multiple stents, congestive heart failure, lymphoma who presents to the ED with what was reported as chest pain however patient is indicating he is having left upper quadrant abdominal pain. Pain was described as reflux and began 2-3 hours ago. Pain is nonradiating, not associated with shortness of breath, nausea, vomiting. Pain is now resolved. Family does endorse recent diarrhea and poor appetite. Patient is on Lasix for heart failure however his dose today was held. Currently patient denies any physical complaints.   Past Medical History:  Diagnosis Date  . AMD (age-related macular degeneration), bilateral   . Anemia   . Anginal pain ([place])    pts wife states pt had to use nitroglycerin this am 04/09/2016  . CAD (coronary artery disease)     NYHA Class 1B, CABG 1985  . CHF (congestive heart failure) ([place])   . Collagen vascular disease ([place])   . Collapsed lung    right   . Complication of anesthesia    pts wife states pt gets confused   . Degeneration of lumbar or lumbosacral intervertebral disc   . Depressive disorder, not elsewhere classified   . Dysrhythmia   . GERD (gastroesophageal reflux disease)   . History of blood transfusion   . History of hiatal hernia   . HTN (hypertension)   . Hypercholesteremia   . Lower leg edema    bilat  . Multiple falls   . Myocardial infarction   . Neuromuscular disorder (HCC)    neuropathy  . Neuropathy ([place])   . Non Hodgkin's lymphoma ([place])   . OSA on CPAP   . PE (pulmonary embolism) 03/2015  . Presence of permanent cardiac pacemaker   . Prostate cancer ([place])   . Shortness of breath dyspnea    with exertion   .  Sinoatrial node dysfunction (HCC)   . Ulcers of both lower legs (HCC)    history of   . Urinary incontinence     Patient Active Problem List   Diagnosis Date Noted  . Hypercalcemia 06/09/2016  . Sacral decubitus ulcer, stage III ([place]) 06/09/2016  . 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 Gait instability 03/06/2016  . Palliative care encounter   . Weakness generalized   . Supratherapeutic INR 01/13/2016  . Encephalopathy acute 01/13/2016  . Falls 01/12/2016  . Failure to thrive in adult 01/12/2016  . Retroperitoneal mass 01/12/2016  . Hydronephrosis 01/12/2016  . AKI (acute kidney injury) ([place]) 01/12/2016  . Weakness 01/12/2016  . Dehydration 12/19/2015  . Long term current use of anticoagulant therapy 12/19/2015  . Peripheral edema 12/19/2015  . Rectal bleeding 12/19/2015  . Prostate cancer ([place]) 12/19/2015  . Chronic diastolic heart failure ([place]) 08/24/2015  . Pain in the chest   . NSTEMI (non-ST elevated myocardial infarction) ([place])   . Iron deficiency anemia due to chronic blood loss   . Chest pain 08/23/2015  . Encounter for chemotherapy management 05/06/2015  . Pulmonary embolus ([place]) 04/01/2015  . CKD (chronic kidney disease), stage III 04/01/2015  . Chronic anemia 04/01/2015  . Acute respiratory failure with hypoxia ([place]) 04/01/2015  . Sinus node dysfunction chronotropic incompetence 08/23/2013  . Cardiac pacemaker -[facility]  [employer] 08/23/2013  . Atrial fibrillation ([place]) 08/23/2013  . Dyspnea 07/10/2013  . OSA on CPAP 07/10/2013  . Peripheral vascular disease ([place]) 06/07/2013  . Atherosclerosis of coronary artery bypass graft of native heart without angina pectoris 06/07/2013  . Hyperlipidemia 06/07/2013  . Essential hypertension, benign 06/07/2013  . Nodular lymphoma of extranodal and/or solid organ site [facility]) 07/03/2011    Past Surgical History:  Procedure Laterality Date  . APPENDECTOMY    . CARDIAC CATHETERIZATION N/A 08/27/2015   Procedure: Left Heart Cath and  Cors/Grafts Angiography;  Surgeon: Belva Crome, MD;  Location: [facility] LAB;  Service: Cardiovascular;  Laterality: N/A;  . COLONOSCOPY N/A 08/26/2015   Procedure: COLONOSCOPY;  Surgeon: Clarene Essex, MD;  Location: [facility] ENDOSCOPY;  Service: Endoscopy;  Laterality: N/A;  . CORONARY ANGIOPLASTY WITH STENT PLACEMENT     s/p bare metal stent implant SVG-diagonal 11/23/05, s/p BM stent implantation SVG diagonal 10/22/06 (feeds LAD), and 05/2010 with DES to left circumflex  . CORONARY ARTERY BYPASS GRAFT    . CYSTOSCOPY W/ URETERAL STENT PLACEMENT Bilateral 04/10/2016   Procedure: CYSTOSCOPY WITH RETROGRADE PYELOGRAM/URETERAL STENT PLACEMENT;  Surgeon: Festus Aloe, MD;  Location: [facility] ORS;  Service: Urology;  Laterality: Bilateral;  . ENDARTERECTOMY     bilat   . ESOPHAGOGASTRODUODENOSCOPY (EGD) WITH PROPOFOL N/A 08/26/2015   Procedure: ESOPHAGOGASTRODUODENOSCOPY (EGD) WITH PROPOFOL;  Surgeon: Clarene Essex, MD;  Location: [facility] ENDOSCOPY;  Service: Endoscopy;  Laterality: N/A;  . EXPLORATORY LAPAROTOMY    . LUMBAR FUSION    . PACEMAKER INSERTION    . ROTATOR CUFF REPAIR     bilat  . TONSILLECTOMY         Home Medications    Prior to Admission medications   Medication Sig Start Date End Date Taking? Authorizing Provider  abiraterone Acetate (ZYTIGA) 250 MG tablet Take 4 tablets (1,000 mg total) by mouth daily. Take on an empty stomach 1 hour before or 2 hours after a meal 01/23/16   Ladell Pier, MD  acetaminophen (TYLENOL) 500 MG tablet Take 1,000 mg by mouth every 6 (six) hours as needed for mild pain.    Historical Provider, MD  atorvastatin (LIPITOR) 40 MG tablet Take 40 mg by mouth every other day.    Historical Provider, MD  benzocaine (ORAJEL) 10 % mucosal gel Use as directed in the mouth or throat 4 (four) times daily as needed for mouth pain. 01/17/16   Maryann Mikhail, DO  citalopram (CELEXA) 20 MG tablet Take 20 mg by mouth daily.      Historical Provider, MD  clonazePAM (KLONOPIN)  0.25 MG disintegrating tablet Take 0.5 tablets (0.125 mg total) by mouth 2 (two) times daily. 03/23/16   Donika Keith Rake, DO  Cyanocobalamin (VITAMIN B-12 CR) 1000 MCG TBCR Take 1 tablet by mouth daily. 04/08/10   Historical Provider, MD  fluticasone (FLONASE) 50 MCG/ACT nasal spray Place 1 spray into both nostrils daily. Patient taking differently: Place 1 spray into both nostrils daily as needed.  01/17/16   Maryann Mikhail, DO  isosorbide mononitrate (IMDUR) 30 MG 24 hr tablet TAKE ONE TABLET BY MOUTH ONCE DAILY 04/14/16   Belva Crome, MD  Lutein-Zeaxanthin 25-5 MG CAPS Take 1 tablet by mouth daily.    Historical Provider, MD  magnesium hydroxide (MILK OF MAGNESIA) 400 MG/5ML suspension Take 5 mLs by mouth daily as needed for mild constipation.     Historical Provider, MD  Melatonin 3 MG TABS Take 3 mg by mouth at bedtime.  Historical Provider, MD  metoprolol succinate (TOPROL-XL) 50 MG 24 hr tablet Take 1 tablet (50 mg total) by mouth daily. Take with or immediately following a meal. Patient taking differently: Take 25 mg by mouth daily. Take with or immediately following a meal. 10/21/15   Deboraha Sprang, MD  nitroGLYCERIN (NITROLINGUAL) 0.4 MG/SPRAY spray Place 1 spray under the tongue every 5 (five) minutes x 3 doses as needed for chest pain. 08/28/15   Brittainy Erie Noe, PA-C  Polyethyl Glycol-Propyl Glycol (SYSTANE) 0.4-0.3 % SOLN Place 1 drop into both eyes at bedtime.     Historical Provider, MD  predniSONE (DELTASONE) 5 MG tablet TAKE ONE TABLET BY MOUTH ONCE DAILY 04/29/16   Ladell Pier, MD  torsemide (DEMADEX) 20 MG tablet Take 10 mg by mouth 2 (two) times daily.     Historical Provider, MD  warfarin (COUMADIN) 5 MG tablet Take one tablet (5mg ) PO every MWF. half tablet (2.5 mg)all other days. Or as directed by MD Patient taking differently: Take 2.5 mg by mouth daily at 6 PM. Take one tablet (5mg ) PO every MWF. half tablet (2.5 mg)all other days. Or as directed by MD 03/12/16   Ladell Pier, MD    Family History Family History  Problem Relation Age of Onset  . Heart disease Father 42    Social History Social History  Substance Use Topics  . Smoking status: Former Smoker    Packs/day: 0.50    Years: 5.00    Types: Cigarettes    Quit date: 07/28/1955  . Smokeless tobacco: Never Used  . Alcohol use No     Allergies   Penicillins and Simvastatin   Review of Systems Review of Systems Ten systems are reviewed and are negative for acute change except as noted in the HPI   Physical Exam Updated Vital Signs BP (!) 133/53 (BP Location: Right Arm)   Pulse 81   Temp 97.6 F (36.4 C) (Oral)   Resp 22   Ht 5\' 10"  (1.778 m)   Wt 197 lb (89.4 kg)   SpO2 94%   BMI 28.27 kg/m   Physical Exam  Constitutional: He is oriented to person, place, and time. He appears well-developed and well-nourished. No distress.  HENT:  Head: Normocephalic and atraumatic.  Nose: Nose normal.  Mouth/Throat: Mucous membranes are dry.  Eyes: Conjunctivae and EOM are normal. Pupils are equal, round, and reactive to light. Right eye exhibits no discharge. Left eye exhibits no discharge. No scleral icterus.  Neck: Normal range of motion. Neck supple.  Cardiovascular: Normal rate and regular rhythm.  Exam reveals no gallop and no friction rub.   No murmur heard. Pulmonary/Chest: Effort normal and breath sounds normal. No stridor. No respiratory distress. He has no rales.  Abdominal: Soft. He exhibits no distension. There is no tenderness.  Musculoskeletal: He exhibits no edema or tenderness.  Neurological: He is alert and oriented to person, place, and time.  Skin: Skin is warm and dry. No rash noted. He is not diaphoretic. No erythema.  Psychiatric: He has a normal mood and affect.  Vitals reviewed.    ED Treatments / Results  Labs (all labs ordered are listed, but only abnormal results are displayed) Labs Reviewed  BASIC METABOLIC PANEL - Abnormal; Notable for the  following:       Result Value   CO2 21 (*)    Glucose, Bld 118 (*)    BUN 33 (*)    Creatinine, Ser 1.91 (*)  Calcium 11.2 (*)    GFR calc non Af Amer 30 (*)    GFR calc Af Amer 35 (*)    All other components within normal limits  CBC - Abnormal; Notable for the following:    RBC 3.62 (*)    Hemoglobin 10.6 (*)    HCT 32.5 (*)    All other components within normal limits  HEPATIC FUNCTION PANEL - Abnormal; Notable for the following:    Total Protein 5.4 (*)    Albumin 3.3 (*)    ALT 14 (*)    All other components within normal limits  BRAIN NATRIURETIC PEPTIDE - Abnormal; Notable for the following:    B Natriuretic Peptide 133.5 (*)    All other components within normal limits  LIPASE, BLOOD  I-STAT TROPOININ, ED  I-STAT TROPOININ, ED    EKG  EKG Interpretation  Date/Time:  Tuesday June 09 2016 12:38:48 EST Ventricular Rate:  80 PR Interval:  186 QRS Duration: 114 QT Interval:  470 QTC Calculation: 542 R Axis:   55 Text Interpretation:  Sinus rhythm with occasional Premature ventricular complexes Incomplete right bundle branch block Septal infarct , age undetermined Prolonged QT Abnormal ECG Confirmed by [facility] MD, PEDRO [phone]) on 06/09/2016 1:17:11 PM       Radiology Dg Chest 2 View  Result Date: 06/09/2016 CLINICAL DATA:  Chest pain EXAM: CHEST  2 VIEW COMPARISON:  January 12, 2016 FINDINGS: There is a small calcified granuloma in the right lower lobe. There is no edema or consolidation. Heart is upper normal in size with pulmonary vascularity within normal limits. No adenopathy is evident by radiography. There is atherosclerotic calcification in aorta. Pacemaker leads are attached to the right atrium and right ventricle. Patient is status post median sternotomy with apparent coronary artery bypass grafting. No evident bone lesions. There are surgical clips in the right neck and left supraclavicular regions. IMPRESSION: No edema or consolidation. Small calcified  granuloma right lower lobe. Stable cardiac silhouette. Aortic atherosclerosis. Electronically Signed   By: Lowella Grip III M.D.   On: 06/09/2016 13:56    Procedures Procedures (including critical care time)  Medications Ordered in ED Medications  sodium chloride 0.9 % bolus 500 mL (0 mLs Intravenous Stopped 06/09/16 1439)     Initial Impression / Assessment and Plan / ED Course  I have reviewed the triage vital signs and the nursing notes.  Pertinent labs & imaging results that were available during my care of the patient were reviewed by me and considered in my medical decision making (see chart for details).  Clinical Course    Given small IV fluid bolus which per family seemed to perk him up.  Abdomen benign - no indication for imaging at this time. No evidence to suggest pancreatitis or biliary obstruction. Exam without evidence of dehydration and no evidence of volume overload. Unremarkable BNP.  Renal function at baseline. Hemoglobin at baseline.EKG without acute ischemic changes or evidence of pericarditis. Initial troponin negative. We'll obtain delta troponin and if negative patient should be stable for discharge home.  Discussed plan with patient and family who were in agreement.  Delta troponin negative. Patient able to tolerate by mouth hydration.  Safe for discharge with strict return precautions and close PCP follow-up.  Final Clinical Impressions(s) / ED Diagnoses   Final diagnoses:  Epigastric pain  Dehydration   Disposition: Discharge  Condition: Good  I have discussed the results, Dx and Tx plan with the patient who expressed understanding and  agree(s) with the plan. Discharge instructions discussed at great length. The patient was given strict return precautions who verbalized understanding of the instructions. No further questions at time of discharge.    Current Discharge Medication List      Follow Up: Lajean Manes, MD [address] [place] [address] [phone]  Schedule an appointment as soon as possible for a visit  As needed      Fatima Blank, MD 06/09/16 1810

## 2016-06-09 NOTE — ED Notes (Signed)
Main Lab called to see of they could run the HFP, BNP, and Lipase They said they would click them off and run them

## 2016-06-09 NOTE — Assessment & Plan Note (Signed)
Patient presented to the [facility] today with complaint of increased weakness and some confusion.  Patient was found to have an elevated calcium at 12.04 corrected value; and was also dehydrated.  Patient received calcium correction and IV fluid rehydration today.  Also, was noted that patient's creatinine had increased to 1.9 as well.  The plan is for the patient to return either Wednesday or Thursday this week for recheck.  Was also advised to go directly to the emergency department for any worsening symptoms whatsoever.

## 2016-06-10 ENCOUNTER — Encounter: Payer: Self-pay | Admitting: Oncology

## 2016-06-10 NOTE — Progress Notes (Signed)
Received denial letter from [name] for Zytiga. Copy left for RN.

## 2016-06-11 ENCOUNTER — Ambulatory Visit (HOSPITAL_COMMUNITY): Payer: Medicare Other

## 2016-06-11 ENCOUNTER — Inpatient Hospital Stay (HOSPITAL_COMMUNITY)
Admission: AD | Admit: 2016-06-11 | Discharge: 2016-06-15 | DRG: 641 | Disposition: A | Discharge status: Home / Self Care | Payer: Medicare Other | Source: Ambulatory Visit | Attending: Oncology | Admitting: Oncology

## 2016-06-11 ENCOUNTER — Encounter (HOSPITAL_COMMUNITY): Payer: Self-pay

## 2016-06-11 ENCOUNTER — Other Ambulatory Visit: Payer: Self-pay | Admitting: Oncology

## 2016-06-11 ENCOUNTER — Ambulatory Visit (HOSPITAL_BASED_OUTPATIENT_CLINIC_OR_DEPARTMENT_OTHER): Payer: Medicare Other | Admitting: Nurse Practitioner

## 2016-06-11 ENCOUNTER — Encounter: Payer: Self-pay | Admitting: Nurse Practitioner

## 2016-06-11 ENCOUNTER — Other Ambulatory Visit (HOSPITAL_BASED_OUTPATIENT_CLINIC_OR_DEPARTMENT_OTHER): Payer: Medicare Other

## 2016-06-11 VITALS — BP 97/41 | HR 70 | Temp 97.6°F | Resp 18 | Ht 70.0 in

## 2016-06-11 DIAGNOSIS — C8299 Follicular lymphoma, unspecified, extranodal and solid organ sites: Secondary | ICD-10-CM

## 2016-06-11 DIAGNOSIS — E43 Unspecified severe protein-calorie malnutrition: Secondary | ICD-10-CM | POA: Insufficient documentation

## 2016-06-11 DIAGNOSIS — I252 Old myocardial infarction: Secondary | ICD-10-CM | POA: Diagnosis not present

## 2016-06-11 DIAGNOSIS — C8590 Non-Hodgkin lymphoma, unspecified, unspecified site: Secondary | ICD-10-CM | POA: Diagnosis present

## 2016-06-11 DIAGNOSIS — K219 Gastro-esophageal reflux disease without esophagitis: Secondary | ICD-10-CM | POA: Diagnosis present

## 2016-06-11 DIAGNOSIS — F329 Major depressive disorder, single episode, unspecified: Secondary | ICD-10-CM | POA: Diagnosis present

## 2016-06-11 DIAGNOSIS — R4182 Altered mental status, unspecified: Secondary | ICD-10-CM | POA: Diagnosis present

## 2016-06-11 DIAGNOSIS — Z86711 Personal history of pulmonary embolism: Secondary | ICD-10-CM

## 2016-06-11 DIAGNOSIS — I2510 Atherosclerotic heart disease of native coronary artery without angina pectoris: Secondary | ICD-10-CM | POA: Diagnosis present

## 2016-06-11 DIAGNOSIS — R627 Adult failure to thrive: Secondary | ICD-10-CM

## 2016-06-11 DIAGNOSIS — E860 Dehydration: Secondary | ICD-10-CM

## 2016-06-11 DIAGNOSIS — Z951 Presence of aortocoronary bypass graft: Secondary | ICD-10-CM

## 2016-06-11 DIAGNOSIS — Z86718 Personal history of other venous thrombosis and embolism: Secondary | ICD-10-CM

## 2016-06-11 DIAGNOSIS — D630 Anemia in neoplastic disease: Secondary | ICD-10-CM

## 2016-06-11 DIAGNOSIS — G629 Polyneuropathy, unspecified: Secondary | ICD-10-CM | POA: Diagnosis present

## 2016-06-11 DIAGNOSIS — Z8546 Personal history of malignant neoplasm of prostate: Secondary | ICD-10-CM | POA: Diagnosis not present

## 2016-06-11 DIAGNOSIS — R296 Repeated falls: Secondary | ICD-10-CM | POA: Diagnosis present

## 2016-06-11 DIAGNOSIS — I10 Essential (primary) hypertension: Secondary | ICD-10-CM | POA: Diagnosis present

## 2016-06-11 DIAGNOSIS — L89152 Pressure ulcer of sacral region, stage 2: Secondary | ICD-10-CM | POA: Diagnosis present

## 2016-06-11 DIAGNOSIS — Z79899 Other long term (current) drug therapy: Secondary | ICD-10-CM

## 2016-06-11 DIAGNOSIS — Z7952 Long term (current) use of systemic steroids: Secondary | ICD-10-CM

## 2016-06-11 DIAGNOSIS — Z950 Presence of cardiac pacemaker: Secondary | ICD-10-CM

## 2016-06-11 DIAGNOSIS — N1330 Unspecified hydronephrosis: Secondary | ICD-10-CM

## 2016-06-11 DIAGNOSIS — N179 Acute kidney failure, unspecified: Secondary | ICD-10-CM

## 2016-06-11 DIAGNOSIS — Z888 Allergy status to other drugs, medicaments and biological substances status: Secondary | ICD-10-CM | POA: Diagnosis not present

## 2016-06-11 DIAGNOSIS — Z7901 Long term (current) use of anticoagulants: Secondary | ICD-10-CM | POA: Diagnosis not present

## 2016-06-11 DIAGNOSIS — H353130 Nonexudative age-related macular degeneration, bilateral, stage unspecified: Secondary | ICD-10-CM | POA: Diagnosis present

## 2016-06-11 DIAGNOSIS — C8589 Other specified types of non-Hodgkin lymphoma, extranodal and solid organ sites: Secondary | ICD-10-CM

## 2016-06-11 DIAGNOSIS — R791 Abnormal coagulation profile: Secondary | ICD-10-CM | POA: Diagnosis present

## 2016-06-11 DIAGNOSIS — L89159 Pressure ulcer of sacral region, unspecified stage: Secondary | ICD-10-CM

## 2016-06-11 DIAGNOSIS — G4733 Obstructive sleep apnea (adult) (pediatric): Secondary | ICD-10-CM | POA: Diagnosis present

## 2016-06-11 DIAGNOSIS — Z880 Allergy status to penicillin: Secondary | ICD-10-CM

## 2016-06-11 DIAGNOSIS — F05 Delirium due to known physiological condition: Secondary | ICD-10-CM | POA: Diagnosis present

## 2016-06-11 DIAGNOSIS — Z955 Presence of coronary angioplasty implant and graft: Secondary | ICD-10-CM | POA: Diagnosis not present

## 2016-06-11 DIAGNOSIS — C61 Malignant neoplasm of prostate: Secondary | ICD-10-CM

## 2016-06-11 DIAGNOSIS — Z9221 Personal history of antineoplastic chemotherapy: Secondary | ICD-10-CM

## 2016-06-11 DIAGNOSIS — Z8249 Family history of ischemic heart disease and other diseases of the circulatory system: Secondary | ICD-10-CM

## 2016-06-11 DIAGNOSIS — Z87891 Personal history of nicotine dependence: Secondary | ICD-10-CM

## 2016-06-11 LAB — CBC WITH DIFFERENTIAL/PLATELET
BASO%: 0.5 % (ref 0.0–2.0)
Basophils Absolute: 0.1 10*3/uL (ref 0.0–0.1)
EOS ABS: 0.7 10*3/uL — AB (ref 0.0–0.5)
EOS%: 5.9 % (ref 0.0–7.0)
HCT: 34.2 % — ABNORMAL LOW (ref 38.4–49.9)
HEMOGLOBIN: 11.2 g/dL — AB (ref 13.0–17.1)
LYMPH#: 2.5 10*3/uL (ref 0.9–3.3)
LYMPH%: 21.4 % (ref 14.0–49.0)
MCH: 29.1 pg (ref 27.2–33.4)
MCHC: 32.6 g/dL (ref 32.0–36.0)
MCV: 89.1 fL (ref 79.3–98.0)
MONO#: 0.7 10*3/uL (ref 0.1–0.9)
MONO%: 6.3 % (ref 0.0–14.0)
NEUT#: 7.7 10*3/uL — ABNORMAL HIGH (ref 1.5–6.5)
NEUT%: 65.9 % (ref 39.0–75.0)
Platelets: 196 10*3/uL (ref 140–400)
RBC: 3.84 10*6/uL — AB (ref 4.20–5.82)
RDW: 16.6 % — ABNORMAL HIGH (ref 11.0–14.6)
WBC: 11.7 10*3/uL — AB (ref 4.0–10.3)

## 2016-06-11 LAB — PROTIME-INR
INR: 2.92
Prothrombin Time: 31.1 seconds — ABNORMAL HIGH (ref 11.4–15.2)

## 2016-06-11 LAB — COMPREHENSIVE METABOLIC PANEL
ALT: 21 U/L (ref 0–55)
AST: 34 U/L (ref 5–34)
Albumin: 3 g/dL — ABNORMAL LOW (ref 3.5–5.0)
Alkaline Phosphatase: 105 U/L (ref 40–150)
Anion Gap: 13 mEq/L — ABNORMAL HIGH (ref 3–11)
BUN: 34.5 mg/dL — AB (ref 7.0–26.0)
CHLORIDE: 109 meq/L (ref 98–109)
CO2: 18 meq/L — AB (ref 22–29)
Calcium: 11 mg/dL — ABNORMAL HIGH (ref 8.4–10.4)
Creatinine: 1.9 mg/dL — ABNORMAL HIGH (ref 0.7–1.3)
EGFR: 30 mL/min/{1.73_m2} — AB (ref >90)
GLUCOSE: 112 mg/dL (ref 70–140)
POTASSIUM: 3.5 meq/L (ref 3.5–5.1)
SODIUM: 140 meq/L (ref 136–145)
Total Bilirubin: 0.63 mg/dL (ref 0.20–1.20)
Total Protein: 5.7 g/dL — ABNORMAL LOW (ref 6.4–8.3)

## 2016-06-11 LAB — MAGNESIUM: Magnesium: 1.8 mg/dl (ref 1.5–2.5)

## 2016-06-11 MED ORDER — POLYETHYL GLYCOL-PROPYL GLYCOL 0.4-0.3 % OP SOLN: 1.0000 [drp] | Freq: Every day | OPHTHALMIC | Status: DC

## 2016-06-11 MED ORDER — ISOSORBIDE MONONITRATE ER 30 MG PO TB24
30.0000 mg | ORAL_TABLET | Freq: Every day | ORAL | Status: DC
  Administered 2016-06-11 – 2016-06-14 (×4): 30 mg via ORAL
  Filled 2016-06-11 (×4): qty 1

## 2016-06-11 MED ORDER — ENSURE ENLIVE PO LIQD
237.0000 mL | Freq: Two times a day (BID) | ORAL | Status: DC
  Administered 2016-06-11 – 2016-06-15 (×4): 237 mL via ORAL

## 2016-06-11 MED ORDER — SODIUM CHLORIDE 0.9 % IV SOLN
Freq: Once | INTRAVENOUS | Status: DC
  Administered 2016-06-11: 12:00:00 via INTRAVENOUS

## 2016-06-11 MED ORDER — NITROGLYCERIN 0.4 MG/SPRAY TL SOLN: 1.0000 | Status: DC | PRN

## 2016-06-11 MED ORDER — ACETAMINOPHEN 500 MG PO TABS
1000.0000 mg | ORAL_TABLET | Freq: Four times a day (QID) | ORAL | Status: DC | PRN
  Administered 2016-06-12 – 2016-06-15 (×2): 1000 mg via ORAL
  Filled 2016-06-11 (×2): qty 2

## 2016-06-11 MED ORDER — POLYVINYL ALCOHOL 1.4 % OP SOLN
1.0000 [drp] | Freq: Every day | OPHTHALMIC | Status: DC
  Administered 2016-06-11 – 2016-06-14 (×3): 1 [drp] via OPHTHALMIC
  Filled 2016-06-11 (×2): qty 15

## 2016-06-11 MED ORDER — WARFARIN SODIUM 2.5 MG PO TABS
2.5000 mg | ORAL_TABLET | Freq: Once | ORAL | Status: AC
  Administered 2016-06-11: 2.5 mg via ORAL
  Filled 2016-06-11 (×2): qty 1

## 2016-06-11 MED ORDER — WARFARIN SODIUM 2.5 MG PO TABS: 2.5000 mg | ORAL_TABLET | Freq: Every day | ORAL | Status: DC

## 2016-06-11 MED ORDER — TRAMADOL HCL 50 MG PO TABS: 50.0000 mg | ORAL_TABLET | Freq: Four times a day (QID) | ORAL | Status: DC | PRN

## 2016-06-11 MED ORDER — ALUM & MAG HYDROXIDE-SIMETH 200-200-20 MG/5ML PO SUSP
15.0000 mL | ORAL | Status: DC | PRN
  Administered 2016-06-12: 15 mL via ORAL
  Filled 2016-06-11: qty 30

## 2016-06-11 MED ORDER — WARFARIN - PHARMACIST DOSING INPATIENT: Freq: Every day | Status: DC

## 2016-06-11 MED ORDER — ABIRATERONE ACETATE 250 MG PO TABS
1000.0000 mg | ORAL_TABLET | Freq: Every day | ORAL | Status: DC
  Administered 2016-06-12 – 2016-06-15 (×4): 1000 mg via ORAL
  Filled 2016-06-11: qty 4

## 2016-06-11 MED ORDER — FLUTICASONE PROPIONATE 50 MCG/ACT NA SUSP: 1.0000 | Freq: Every day | NASAL | Status: DC | PRN

## 2016-06-11 MED ORDER — CITALOPRAM HYDROBROMIDE 20 MG PO TABS
20.0000 mg | ORAL_TABLET | Freq: Every day | ORAL | Status: DC
  Administered 2016-06-11 – 2016-06-15 (×5): 20 mg via ORAL
  Filled 2016-06-11 (×5): qty 1

## 2016-06-11 MED ORDER — POLYETHYLENE GLYCOL 3350 17 G PO PACK: 17.0000 g | PACK | Freq: Every day | ORAL | Status: DC | PRN

## 2016-06-11 MED ORDER — CALCITONIN (SALMON) 200 UNIT/ML IJ SOLN
4.0000 [IU]/kg | Freq: Two times a day (BID) | INTRAMUSCULAR | Status: DC
  Administered 2016-06-11: 19:00:00 via INTRAMUSCULAR
  Administered 2016-06-12: 358 [IU] via INTRAMUSCULAR
  Filled 2016-06-11 (×3): qty 1.79

## 2016-06-11 MED ORDER — PREDNISONE 5 MG PO TABS
5.0000 mg | ORAL_TABLET | Freq: Every day | ORAL | Status: DC
  Administered 2016-06-12 – 2016-06-15 (×4): 5 mg via ORAL
  Filled 2016-06-11 (×4): qty 1

## 2016-06-11 MED ORDER — METOPROLOL SUCCINATE ER 25 MG PO TB24
25.0000 mg | ORAL_TABLET | Freq: Every day | ORAL | Status: DC
  Administered 2016-06-11 – 2016-06-14 (×4): 25 mg via ORAL
  Filled 2016-06-11 (×4): qty 1

## 2016-06-11 MED ORDER — SODIUM CHLORIDE 0.9 % IV SOLN
INTRAVENOUS | Status: DC
  Administered 2016-06-12 – 2016-06-14 (×3): via INTRAVENOUS

## 2016-06-11 MED ORDER — ONDANSETRON HCL 4 MG/2ML IJ SOLN
4.0000 mg | Freq: Four times a day (QID) | INTRAMUSCULAR | Status: DC | PRN
  Administered 2016-06-11 – 2016-06-14 (×5): 4 mg via INTRAVENOUS
  Filled 2016-06-11 (×5): qty 2

## 2016-06-11 NOTE — Progress Notes (Signed)
ANTICOAGULATION CONSULT NOTE - Initial Consult  Pharmacy Consult for Warfarin Indication: atrial fibrillation  Allergies  Allergen Reactions  . Penicillins Hives, Shortness Of Breath and Other (See Comments)    Has patient had a PCN reaction causing immediate rash, facial/tongue/throat swelling, SOB or lightheadedness with hypotension: Yes Has patient had a PCN reaction causing severe rash involving mucus membranes or skin necrosis: No Has patient had a PCN reaction that required hospitalization No Has patient had a PCN reaction occurring within the last 10 years: No If all of the above answers are "NO", then may proceed with Cephalosporin use.   . Simvastatin Other (See Comments)    Reaction:  Muscle weakness    Patient Measurements:    Vital Signs: Temp: 97.5 F (36.4 C) (11/16 1539) Temp Source: Oral (11/16 1539) BP: 132/65 (11/16 1539) Pulse Rate: 68 (11/16 1539)  Labs:  Recent Labs  06/09/16 1254 06/11/16 1121 06/11/16 1525  HGB 10.6* 11.2*  --   HCT 32.5* 34.2*  --   PLT 162 196  --   LABPROT  --   --  31.1*  INR  --   --  2.92  CREATININE 1.91* 1.9*  --     Estimated Creatinine Clearance: 30.8 mL/min (by C-G formula based on SCr of 1.9 mg/dL (H)).   Medical History: Past Medical History:  Diagnosis Date  . AMD (age-related macular degeneration), bilateral   . Anemia   . Anginal pain ([place])    pts wife states pt had to use nitroglycerin this am 04/09/2016  . CAD (coronary artery disease)     NYHA Class 1B, CABG 1985  . CHF (congestive heart failure) ([place])   . Collagen vascular disease ([place])   . Collapsed lung    right   . Complication of anesthesia    pts wife states pt gets confused   . Degeneration of lumbar or lumbosacral intervertebral disc   . Depressive disorder, not elsewhere classified   . Dysrhythmia   . GERD (gastroesophageal reflux disease)   . History of blood transfusion   . History of hiatal hernia   . HTN (hypertension)   .  Hypercholesteremia   . Lower leg edema    bilat  . Multiple falls   . Myocardial infarction   . Neuromuscular disorder (HCC)    neuropathy  . Neuropathy ([place])   . Non Hodgkin's lymphoma ([place])   . OSA on CPAP   . PE (pulmonary embolism) 03/2015  . Presence of permanent cardiac pacemaker   . Prostate cancer ([place])   . Shortness of breath dyspnea    with exertion   . Sinoatrial node dysfunction (HCC)   . Ulcers of both lower legs (HCC)    history of   . Urinary incontinence     Medications:  Scheduled:  . abiraterone Acetate  1,000 mg Oral Daily  . calcitonin  4 Units/kg Intramuscular Q12H  . citalopram  20 mg Oral Daily  . feeding supplement (ENSURE ENLIVE)  237 mL Oral BID BM  . isosorbide mononitrate  30 mg Oral Daily  . metoprolol succinate  25 mg Oral Daily  . Polyethyl Glycol-Propyl Glycol  1 drop Both Eyes QHS  . predniSONE  5 mg Oral Daily  . warfarin  2.5 mg Oral q1800   Infusions:  . sodium chloride     PRN: acetaminophen, fluticasone, nitroGLYCERIN, polyethylene glycol, traMADol  Assessment: 82 yo male on chronic warfarin for afib admitted from [facility] per family request.  Home  dose of warfarin reported as 2.5mg  daily with last dose taken 11/15.  INR is therapeutic on admission (2.92).  Pharmacy is consulted to continue dosing warfarin upon admission.  Goal of Therapy:  INR 2-3 Monitor platelets by anticoagulation protocol: Yes   Plan:   Warfarin 2.5mg  PO x 1 today at 18:00  Daily PT/INR  Peggyann Juba, PharmD, BCPS Pager: [phone] 06/11/2016,4:07 PM

## 2016-06-11 NOTE — H&P (Signed)
Patient History and Physical   KANNIN QIN [id number] [birth year] 82 y.o. 06/11/2016    Patient Identification: Mr. Stem is admitted for evaluation and management of hypercalcemia and failure to thrive.  HPI:  Mr. Stagnaro is well-known to me with a history of non-Hodgkin's lymphoma and prostate cancer. He has multiple comorbid conditions. He is currently being evaluated for retroperitoneal and neck lymphadenopathy felt to be related to progression of low grade non-Hodgkin's lymphoma. He is considering treatment with bendamustine/rituximab.  We saw him on 06/08/2016 with lethargy and failure to thrive. He was noted to have hypercalcemia. He was treated with intravenous fluids and pamidronate. He presented to the emergency room on 06/09/2016 with chest pain. A troponin was negative. He was discharged to home.  He presented to the Cancer center today with failure to thrive and lethargy. He has persistent hypercalcemia. He is being admitted for further evaluation and management.  PMH:  Past Medical History:  Diagnosis Date  . AMD (age-related macular degeneration), bilateral   . Anemia   . Anginal pain ([place])      . CAD (coronary artery disease)     NYHA Class 1B, CABG 1985  . CHF (congestive heart failure) ([place])   . Collagen vascular disease ([place])   . Collapsed lung    right   . Complication of anesthesia    pts wife states pt gets confused   . Degeneration of lumbar or lumbosacral intervertebral disc   . Depressive disorder, not elsewhere classified   . Dysrhythmia   . GERD (gastroesophageal reflux disease)   . History of blood transfusion   . History of hiatal hernia   . HTN (hypertension)   . Hypercholesteremia   . Lower leg edema    bilat  . Multiple falls   . Myocardial infarction   . Neuromuscular disorder (HCC)    neuropathy  . Neuropathy ([place])   . Non Hodgkin's lymphoma (HCC)-Follicular grade 3  [id number]   . OSA on CPAP   . PE (pulmonary embolism) 03/2015  .  Presence of permanent cardiac pacemaker   . Prostate cancer ([place])   . Renal insufficiency/hydronephrosis-placement of bilateral ureter stents 04/10/2016       . Sinoatrial node dysfunction (HCC)   . Ulcers of both lower legs (HCC)    history of   . Urinary incontinence     Past Surgical History:  Procedure Laterality Date  . APPENDECTOMY    . BACK SURGERY    . CARDIAC CATHETERIZATION N/A 08/27/2015   Procedure: Left Heart Cath and Cors/Grafts Angiography;  Surgeon: Belva Crome, MD;  Location: [facility];  Service: Cardiovascular;  Laterality: N/A;  . COLONOSCOPY N/A 08/26/2015   Procedure: COLONOSCOPY;  Surgeon: Clarene Essex, MD;  Location: [facility] ENDOSCOPY;  Service: Endoscopy;  Laterality: N/A;  . CORONARY ANGIOPLASTY WITH STENT PLACEMENT     s/p bare metal stent implant SVG-diagonal 11/23/05, s/p BM stent implantation SVG diagonal 10/22/06 (feeds LAD), and 05/2010 with DES to left circumflex  . CORONARY ARTERY BYPASS GRAFT    . CYSTOSCOPY W/ URETERAL STENT PLACEMENT Bilateral 04/10/2016   Procedure: CYSTOSCOPY WITH RETROGRADE PYELOGRAM/URETERAL STENT PLACEMENT;  Surgeon: Festus Aloe, MD;  Location: [facility] ORS;  Service: Urology;  Laterality: Bilateral;  . ENDARTERECTOMY     bilat   . ESOPHAGOGASTRODUODENOSCOPY (EGD) WITH PROPOFOL N/A 08/26/2015   Procedure: ESOPHAGOGASTRODUODENOSCOPY (EGD) WITH PROPOFOL;  Surgeon: Clarene Essex, MD;  Location: [facility] ENDOSCOPY;  Service: Endoscopy;  Laterality: N/A;  . EXPLORATORY LAPAROTOMY    .  LUMBAR FUSION    . PACEMAKER INSERTION    . ROTATOR CUFF REPAIR     bilat  . TONSILLECTOMY      Allergies:  Allergies  Allergen Reactions  . Penicillins Hives, Shortness Of Breath and Other (See Comments)    Has patient had a PCN reaction causing immediate rash, facial/tongue/throat swelling, SOB or lightheadedness with hypotension: Yes Has patient had a PCN reaction causing severe rash involving mucus membranes or skin necrosis: No Has patient had a PCN  reaction that required hospitalization No Has patient had a PCN reaction occurring within the last 10 years: No If all of the above answers are "NO", then may proceed with Cephalosporin use.   . Simvastatin Other (See Comments)    Reaction:  Muscle weakness    Medications:  Medications Prior to Admission  Medication Sig Dispense Refill  . abiraterone Acetate (ZYTIGA) 250 MG tablet Take 4 tablets (1,000 mg total) by mouth daily. Take on an empty stomach 1 hour before or 2 hours after a meal 120 tablet 1  . acetaminophen (TYLENOL) 500 MG tablet Take 1,000 mg by mouth every 6 (six) hours as needed for mild pain, moderate pain or headache.     Marland Kitchen atorvastatin (LIPITOR) 40 MG tablet Take 40 mg by mouth every other day.    . benzocaine (ORAJEL) 10 % mucosal gel Use as directed 1 application in the mouth or throat as needed for mouth pain.    . citalopram (CELEXA) 20 MG tablet Take 20 mg by mouth at bedtime.     . clonazePAM (KLONOPIN) 0.25 MG disintegrating tablet Take 0.5 tablets (0.125 mg total) by mouth 2 (two) times daily. 30 tablet 1  . fluticasone (FLONASE) 50 MCG/ACT nasal spray Place 1 spray into both nostrils daily as needed for rhinitis.    Marland Kitchen isosorbide mononitrate (IMDUR) 30 MG 24 hr tablet Take 30 mg by mouth at bedtime.    . Lutein-Zeaxanthin 25-5 MG CAPS Take 1 capsule by mouth daily.     . magnesium hydroxide (MILK OF MAGNESIA) 400 MG/5ML suspension Take 5 mLs by mouth daily as needed for mild constipation.     . Melatonin 3 MG TABS Take 3 mg by mouth at bedtime.    . metoprolol succinate (TOPROL-XL) 50 MG 24 hr tablet Take 25 mg by mouth at bedtime. Take with or immediately following a meal.    . nitroGLYCERIN (NITROLINGUAL) 0.4 MG/SPRAY spray Place 1 spray under the tongue every 5 (five) minutes x 3 doses as needed for chest pain. 12 g 3  . Polyethyl Glycol-Propyl Glycol (SYSTANE) 0.4-0.3 % SOLN Place 1 drop into both eyes at bedtime.     . predniSONE (DELTASONE) 5 MG tablet Take  5 mg by mouth daily with breakfast.    . torsemide (DEMADEX) 20 MG tablet Take 10 mg by mouth 2 (two) times daily.     . vitamin B-12 (CYANOCOBALAMIN) 1000 MCG tablet Take 1,000 mcg by mouth daily.    Marland Kitchen warfarin (COUMADIN) 5 MG tablet Take 2.5 mg by mouth at bedtime.      Social History:    reports that he quit smoking about 60 years ago. His smoking use included Cigarettes. He has a 2.50 pack-year smoking history. He has never used smokeless tobacco. He reports that he does not drink alcohol or use drugs.  Family History:  Family History  Problem Relation Age of Onset  . Cancer Mother   . Heart disease Father 50  . Heart  disease Brother     Review of Systems:  Positives include:Pain at the sacrum, malaise, neuropathy pain in the legs  A complete ROS was otherwise negative.   Physical Exam:  Blood pressure 132/65, pulse 68, temperature 97.5 F (36.4 C), temperature source Oral, resp. rate 17, SpO2 91 %.  HEENT: Neck without mass Lungs: Scattered rales at the posterior chest, no respiratory distress Cardiac: Regular rate and rhythm Abdomen: No hepatomegaly, nontender  Vascular: No leg edema Lymph nodes: Firm 2 cm node in the left scalene area Neurologic: Lethargic, arousable, speaks slowly. He cannot ambulate. He is able to stand with assistance. Skin: Superficial decubitus ulcer at the upper gluteal fold   Lab Results:  Lab Results  Component Value Date   WBC 11.7 (H) 06/11/2016   HGB 11.2 (L) 06/11/2016   HCT 34.2 (L) 06/11/2016   MCV 89.1 06/11/2016   PLT 196 06/11/2016   NEUTROABS 7.7 (H) 06/11/2016   Potassium 3.5, creatinine 1.9, calcium 11, albumin 3, bilirubin 0.63     Impression and Plan:  1. Non-Hodgkin's lymphoma-remote history of follicular lymphoma treated with chemotherapy/rituximab in 2008.  Biopsy of a retroperitoneal mass 05/13/2016 consistent with involvement by low-grade non-Hodgkin's lymphoma  2. History of prostate cancer-currently  being treated with abiraterone, PSA has normalized  3.  Renal failure/bilateral hydronephrosis-placement of ureter stents 04/10/2016  4.  Hypercalcemia-most likely secondary to non-Hodgkin's lymphoma, status post pamidronate given 06/08/2016  5.  History of coronary artery disease  6.  History of congestive heart failure  7.  Chronic anemia secondary to renal failure and non-Hodgkin's lymphoma  8.  Peripheral neuropathy  9.  Sacral decubitus ulcer   Mr. Lightle has non-Hodgkin's lymphoma and multiple comorbid conditions. His performance status has declined over the past several weeks. His symptoms may be related to progression of lymphoma and hypercalcemia. He was considering treatment with bendamustine/rituximab. We decided against biopsy of a palpable left neck node.  He presents today with further worsening of his performance status. I discussed the situation with his wife and daughter. The understand he is not a candidate for chemotherapy in his current condition. He stated that he would like to pursue treatment of the lymphoma and hypercalcemia.  We decided to admit him for intravenous hydration, calcitonin, and follow-up of the calcium level. If his mental status and performance status improve we will consider treatment of the lymphoma.  I discussed CPR and ACLS issues with his family. They indicate he would like to remain on a full CODE STATUS for now. We will continue to discuss this while he is in the hospital.     [name], MD  06/11/2016, 5:50 PM

## 2016-06-11 NOTE — Patient Instructions (Signed)
Dehydration, Adult Dehydration is a condition in which there is not enough fluid or water in the body. This happens when you lose more fluids than you take in. Important organs, such as the kidneys, brain, and heart, cannot function without a proper amount of fluids. Any loss of fluids from the body can lead to dehydration. Dehydration can range from mild to severe. This condition should be treated right away to prevent it from becoming severe. What are the causes? This condition may be caused by:  Vomiting.  Diarrhea.  Excessive sweating, such as from heat exposure or exercise.  Not drinking enough fluid, especially:  When ill.  While doing activity that requires a lot of energy.  Excessive urination.  Fever.  Infection.  Certain medicines, such as medicines that cause the body to lose excess fluid (diuretics).  Inability to access safe drinking water.  Reduced physical ability to get adequate water and food. What increases the risk? This condition is more likely to develop in people:  Who have a poorly controlled long-term (chronic) illness, such as diabetes, heart disease, or kidney disease.  Who are age 65 or older.  Who are disabled.  Who live in a place with high altitude.  Who play endurance sports. What are the signs or symptoms? Symptoms of mild dehydration may include:   Thirst.  Dry lips.  Slightly dry mouth.  Dry, warm skin.  Dizziness. Symptoms of moderate dehydration may include:   Very dry mouth.  Muscle cramps.  Dark urine. Urine may be the color of tea.  Decreased urine production.  Decreased tear production.  Heartbeat that is irregular or faster than normal (palpitations).  Headache.  Light-headedness, especially when you stand up from a sitting position.  Fainting (syncope). Symptoms of severe dehydration may include:   Changes in skin, such as:  Cold and clammy skin.  Blotchy (mottled) or pale skin.  Skin that does  not quickly return to normal after being lightly pinched and released (poor skin turgor).  Changes in body fluids, such as:  Extreme thirst.  No tear production.  Inability to sweat when body temperature is high, such as in hot weather.  Very little urine production.  Changes in vital signs, such as:  Weak pulse.  Pulse that is more than 100 beats a minute when sitting still.  Rapid breathing.  Low blood pressure.  Other changes, such as:  Sunken eyes.  Cold hands and feet.  Confusion.  Lack of energy (lethargy).  Difficulty waking up from sleep.  Short-term weight loss.  Unconsciousness. How is this diagnosed? This condition is diagnosed based on your symptoms and a physical exam. Blood and urine tests may be done to help confirm the diagnosis. How is this treated? Treatment for this condition depends on the severity. Mild or moderate dehydration can often be treated at home. Treatment should be started right away. Do not wait until dehydration becomes severe. Severe dehydration is an emergency and it needs to be treated in a hospital. Treatment for mild dehydration may include:   Drinking more fluids.  Replacing salts and minerals in your blood (electrolytes) that you may have lost. Treatment for moderate dehydration may include:   Drinking an oral rehydration solution (ORS). This is a drink that helps you replace fluids and electrolytes (rehydrate). It can be found at pharmacies and retail stores. Treatment for severe dehydration may include:   Receiving fluids through an IV tube.  Receiving an electrolyte solution through a feeding tube that is   passed through your nose and into your stomach (nasogastric tube, or NG tube).  Correcting any abnormalities in electrolytes.  Treating the underlying cause of dehydration. Follow these instructions at home:  If directed by your health care provider, drink an ORS:  Make an ORS by following instructions on the  package.  Start by drinking small amounts, about  cup (120 mL) every 5-10 minutes.  Slowly increase how much you drink until you have taken the amount recommended by your health care provider.  Drink enough clear fluid to keep your urine clear or pale yellow. If you were told to drink an ORS, finish the ORS first, then start slowly drinking other clear fluids. Drink fluids such as:  Water. Do not drink only water. Doing that can lead to having too little salt (sodium) in the body (hyponatremia).  Ice chips.  Fruit juice that you have added water to (diluted fruit juice).  Low-calorie sports drinks.  Avoid:  Alcohol.  Drinks that contain a lot of sugar. These include high-calorie sports drinks, fruit juice that is not diluted, and soda.  Caffeine.  Foods that are greasy or contain a lot of fat or sugar.  Take over-the-counter and prescription medicines only as told by your health care provider.  Do not take sodium tablets. This can lead to having too much sodium in the body (hypernatremia).  Eat foods that contain a healthy balance of electrolytes, such as bananas, oranges, potatoes, tomatoes, and spinach.  Keep all follow-up visits as told by your health care provider. This is important. Contact a health care provider if:  You have abdominal pain that:  Gets worse.  Stays in one area (localizes).  You have a rash.  You have a stiff neck.  You are more irritable than usual.  You are sleepier or more difficult to wake up than usual.  You feel weak or dizzy.  You feel very thirsty.  You have urinated only a small amount of very dark urine over 6-8 hours. Get help right away if:  You have symptoms of severe dehydration.  You cannot drink fluids without vomiting.  Your symptoms get worse with treatment.  You have a fever.  You have a severe headache.  You have vomiting or diarrhea that:  Gets worse.  Does not go away.  You have blood or green matter  (bile) in your vomit.  You have blood in your stool. This may cause stool to look black and tarry.  You have not urinated in 6-8 hours.  You faint.  Your heart rate while sitting still is over 100 beats a minute.  You have trouble breathing. This information is not intended to replace advice given to you by your health care provider. Make sure you discuss any questions you have with your health care provider. Document Released: 07/13/2005 Document Revised: 02/07/2016 Document Reviewed: 09/06/2015 Elsevier Interactive Patient Education  2017 Elsevier Inc.  

## 2016-06-11 NOTE — Progress Notes (Signed)
PT WAS ADMITTED TODAY.  SEE H & P NOTE.  ____________________________________________  Pt to be admitted per family request. Peripheral IV was saline locked at time of transfer. Selena Lesser, NP has called report to Claiborne Billings, Therapist, sports.  Pt transported via w/c per NT Magda Paganini. Wife and daughter accompanied pt.

## 2016-06-12 ENCOUNTER — Telehealth: Payer: Self-pay | Admitting: Pharmacist

## 2016-06-12 DIAGNOSIS — R4182 Altered mental status, unspecified: Secondary | ICD-10-CM

## 2016-06-12 LAB — CBC
HCT: 29.4 % — ABNORMAL LOW (ref 39.0–52.0)
Hemoglobin: 9.7 g/dL — ABNORMAL LOW (ref 13.0–17.0)
MCH: 29.9 pg (ref 26.0–34.0)
MCHC: 33 g/dL (ref 30.0–36.0)
MCV: 90.7 fL (ref 78.0–100.0)
PLATELETS: 189 10*3/uL (ref 150–400)
RBC: 3.24 MIL/uL — AB (ref 4.22–5.81)
RDW: 15.9 % — AB (ref 11.5–15.5)
WBC: 9.8 10*3/uL (ref 4.0–10.5)

## 2016-06-12 LAB — BASIC METABOLIC PANEL
Anion gap: 8 (ref 5–15)
BUN: 33 mg/dL — AB (ref 6–20)
CALCIUM: 9.9 mg/dL (ref 8.9–10.3)
CHLORIDE: 113 mmol/L — AB (ref 101–111)
CO2: 20 mmol/L — AB (ref 22–32)
CREATININE: 1.8 mg/dL — AB (ref 0.61–1.24)
GFR calc non Af Amer: 32 mL/min — ABNORMAL LOW (ref >60)
GFR, EST AFRICAN AMERICAN: 37 mL/min — AB (ref >60)
Glucose, Bld: 122 mg/dL — ABNORMAL HIGH (ref 65–99)
Potassium: 3.5 mmol/L (ref 3.5–5.1)
SODIUM: 141 mmol/L (ref 135–145)

## 2016-06-12 LAB — PROTIME-INR
INR: 3.47
PROTHROMBIN TIME: 35.7 s — AB (ref 11.4–15.2)

## 2016-06-12 MED ORDER — TRAMADOL HCL 50 MG PO TABS
25.0000 mg | ORAL_TABLET | Freq: Four times a day (QID) | ORAL | Status: DC | PRN
  Administered 2016-06-12 – 2016-06-13 (×2): 25 mg via ORAL
  Administered 2016-06-15: 50 mg via ORAL
  Filled 2016-06-12 (×3): qty 1

## 2016-06-12 MED ORDER — POTASSIUM CHLORIDE CRYS ER 20 MEQ PO TBCR
20.0000 meq | EXTENDED_RELEASE_TABLET | Freq: Every day | ORAL | Status: DC
  Administered 2016-06-12 – 2016-06-13 (×2): 20 meq via ORAL
  Filled 2016-06-12 (×2): qty 1

## 2016-06-12 NOTE — Progress Notes (Signed)
ANTICOAGULATION CONSULT NOTE - Initial Consult  Pharmacy Consult for Warfarin Indication: atrial fibrillation  Allergies  Allergen Reactions  . Penicillins Hives, Shortness Of Breath and Other (See Comments)    Has patient had a PCN reaction causing immediate rash, facial/tongue/throat swelling, SOB or lightheadedness with hypotension: Yes Has patient had a PCN reaction causing severe rash involving mucus membranes or skin necrosis: No Has patient had a PCN reaction that required hospitalization No Has patient had a PCN reaction occurring within the last 10 years: No If all of the above answers are "NO", then may proceed with Cephalosporin use.   . Simvastatin Other (See Comments)    Reaction:  Muscle weakness    Patient Measurements:    Vital Signs: Temp: 98.9 F (37.2 C) (11/16 2300) Temp Source: Oral (11/16 2300)  Labs:  Recent Labs  06/09/16 1254 06/11/16 1121 06/11/16 1525 06/12/16 0436  HGB 10.6* 11.2*  --  9.7*  HCT 32.5* 34.2*  --  29.4*  PLT 162 196  --  189  LABPROT  --   --  31.1* 35.7*  INR  --   --  2.92 3.47  CREATININE 1.91* 1.9*  --  1.80*    Estimated Creatinine Clearance: 32.6 mL/min (by C-G formula based on SCr of 1.8 mg/dL (H)).   Medical History: Past Medical History:  Diagnosis Date  . AMD (age-related macular degeneration), bilateral   . Anemia   . Anginal pain ([place])    pts wife states pt had to use nitroglycerin this am 04/09/2016  . CAD (coronary artery disease)     NYHA Class 1B, CABG 1985  . CHF (congestive heart failure) ([place])   . Collagen vascular disease ([place])   . Collapsed lung    right   . Complication of anesthesia    pts wife states pt gets confused   . Degeneration of lumbar or lumbosacral intervertebral disc   . Depressive disorder, not elsewhere classified   . Dysrhythmia   . GERD (gastroesophageal reflux disease)   . History of blood transfusion   . History of hiatal hernia   . HTN (hypertension)   .  Hypercholesteremia   . Lower leg edema    bilat  . Multiple falls   . Myocardial infarction   . Neuromuscular disorder (HCC)    neuropathy  . Neuropathy ([place])   . Non Hodgkin's lymphoma ([place])   . OSA on CPAP   . PE (pulmonary embolism) 03/2015  . Presence of permanent cardiac pacemaker   . Prostate cancer ([place])   . Shortness of breath dyspnea    with exertion   . Sinoatrial node dysfunction (HCC)   . Ulcers of both lower legs (HCC)    history of   . Urinary incontinence     Medications:  Scheduled:  . abiraterone Acetate  1,000 mg Oral Daily  . calcitonin  4 Units/kg Intramuscular Q12H  . citalopram  20 mg Oral Daily  . feeding supplement (ENSURE ENLIVE)  237 mL Oral BID BM  . isosorbide mononitrate  30 mg Oral Daily  . metoprolol succinate  25 mg Oral Daily  . polyvinyl alcohol  1 drop Both Eyes QHS  . predniSONE  5 mg Oral Daily  . Warfarin - Pharmacist Dosing Inpatient   Does not apply q1800   Infusions:  . sodium chloride     PRN: acetaminophen, alum & mag hydroxide-simeth, fluticasone, nitroGLYCERIN, ondansetron (ZOFRAN) IV, polyethylene glycol, traMADol  Assessment: 82 yo male on chronic warfarin  for afib admitted from [facility] per family request.  Home dose of warfarin reported as 2.5mg  daily with last dose taken 11/15.  INR is therapeutic on admission (2.92).  Pharmacy is consulted to continue dosing warfarin upon admission.  06/12/2016 INR 3.47, supratherapeutic Hgb 9.7, HCT 29.4 Plts WNL Pt consuming 100% of meals  Goal of Therapy:  INR 2-3 Monitor platelets by anticoagulation protocol: Yes   Plan:   Hold warfarin tonight  Daily PT/INR  Dolly Rias RPh 06/12/2016, 10:15 AM Pager [phone]

## 2016-06-12 NOTE — Plan of Care (Signed)
Problem: Activity: Goal: Risk for activity intolerance will decrease Outcome: Progressing Up in the chair this am.

## 2016-06-12 NOTE — Consult Note (Signed)
[facility] Nurse wound consult note Reason for Consult: sacral decub Wound type:MASD, stage II, suspected DTI Pressure Ulcer POA: Yes Measurement: 10cm x 8 cm darkened area with concern for possible DTI with a 2cm x 1cm partial thickness wound with 100% pink granulation tissue, no drainage or odor noted Wound bed: see above Drainage (amount, consistency, odor) see above Periwound: intact Dressing procedure/placement/frequency: I have provided nurses with orders for Cleanse perineal and sacral area with skin cleanser, pat dry, apply thin layer of moisture barrier ointment (critic Aid with purple top) twice daily. To sacrum apply sacral foam dressing, peel back daily to assess area due to purplish tint that I am concerned could be a  DTI, change prn soiling. Pt is incontinent at times, does get up to chair during day. Wife present instructed in importance of turning 30% turns to relieve pressure.  Pt states his daughter is a Marine scientist and helps care for him. We will not follow, but will remain available to this patient, to nursing, and the medical and/or surgical teams.  Please re-consult if we need to assist further.     Fara Olden, RN-C, WTA-C Wound Treatment Associate

## 2016-06-12 NOTE — Progress Notes (Signed)
Initial Nutrition Assessment  DOCUMENTATION CODES:   Severe malnutrition in context of chronic illness  INTERVENTION:   Ensure Enlive po BID, each supplement provides 350 kcal and 20 grams of protein  Changed diet from regular to mechanical soft per patient request  NUTRITION DIAGNOSIS:   Malnutrition related to poor appetite, chronic illness, cancer and cancer related treatments as evidenced by energy intake < 75% for > or equal to 1 month, 13%  weight loss in 3 months.  GOAL:   Patient will meet greater than or equal to 90% of their needs  MONITOR:   PO intake, Supplement acceptance  REASON FOR ASSESSMENT:   Malnutrition Screening Tool, Low [place]    ASSESSMENT:   Admitted for FTT and hypercalcemia secondary to recently diagnosed low grade non-hodgkins lymphoma; h/o CHF, MI  Met with patient and patient's wife in room today. Wife reports that patient is eating very little. Had a couple of bites of pancake this morning and some eggs. Wife reports patient was not eating well for one month pta but that he does drink [place] protein supplements a few times a day. Wife reports that patient will eat a couple bites of each meal. Patient has lost 28lbs(13%) over the past three months. Wife reports weight loss of 40lbs in six months. Patient struggles with intermittent constipation and diarrhea. Patient reports having some nausea and abdominal pain. Mild pressure injury on gluteal fold. Wife reports patient having trouble chewing r/t his dentures and requests DYS 3 diet.     Medications reviewed and include: Prednisone, calcitonin, warfarin   Labs reviewed: Cl 113(L), Co2 20(L), BUN 33(H), creat 1.80(H), Alb 3.0(L)   Nutrition-Focused physical exam completed. Findings are no fat depletion, mild muscle depletion in temporal region, and no edema.   Diet Order:  DIET DYS 3 Room service appropriate? Yes; Fluid consistency: Thin  Skin:  Reviewed, no issues  Last BM:  11/16-per RN  notes  Height:   Ht Readings from Last 1 Encounters:  06/11/16 '5\' 10"'  (1.778 m)    Weight:   Wt Readings from Last 1 Encounters:  06/09/16 197 lb (89.4 kg)    Ideal Body Weight:  75.4 kg  BMI:  There is no height or weight on file to calculate BMI.  Estimated Nutritional Needs:   Kcal:  1800-2300kcal/day (20-25kcal/kg/day)  Protein:  90-107g/day (1.0-1.2g/kg/day)  Fluid:  2L/day (57m/kcal/day)  EDUCATION NEEDS:   No education needs identified at this time  CKoleen Distance RD, LDN

## 2016-06-12 NOTE — Progress Notes (Signed)
IP PROGRESS NOTE  Subjective:   He is more alert. His wife and daughter are at the bedside. They report he did not sleep during the night. He wants to go home.  Objective: Vital signs in last 24 hours: Blood pressure (!) 147/78, pulse 91, temperature 98.9 F (37.2 C), temperature source Oral, resp. rate 16, SpO2 94 %.  Intake/Output from previous day: 11/16 0701 - 11/17 0700 In: 350 [P.O.:350] Out: 450 [Urine:450]  Physical Exam:  HEENT: No thrush Lungs: Clear anteriorly Cardiac: Regular rate and rhythm Abdomen: Nontender, no hepatomegaly Extremities: No leg edema Neurologic: Alert, mildly confused, oriented to year and month    Lab Results:  Recent Labs  06/11/16 1121 06/12/16 0436  WBC 11.7* 9.8  HGB 11.2* 9.7*  HCT 34.2* 29.4*  PLT 196 189   Calcium 9.9  BMET  Recent Labs  06/11/16 1121 06/12/16 0436  NA 140 141  K 3.5 3.5  CL  --  113*  CO2 18* 20*  GLUCOSE 112 122*  BUN 34.5* 33*  CREATININE 1.9* 1.80*  CALCIUM 11.0* 9.9  PT/INR-3.47   Medications: I have reviewed the patient's current medications.  Assessment/Plan:  1. Non-Hodgkin's lymphoma-remote history of follicular lymphoma treated with chemotherapy/rituximab in 2008.  Biopsy of a retroperitoneal mass 05/13/2016 consistent with involvement by low-grade non-Hodgkin's lymphoma  2. History of prostate cancer-currently being treated with abiraterone, PSA has normalized  3.  Renal failure/bilateral hydronephrosis-placement of ureter stents 04/10/2016  4.  Hypercalcemia-most likely secondary to non-Hodgkin's lymphoma, status post pamidronate given 06/08/2016, calcitonin 06/11/2016-improved  5.  History of coronary artery disease  6.  History of congestive heart failure  7.  Chronic anemia secondary to renal failure and non-Hodgkin's lymphoma  8.  Peripheral neuropathy  9.  Sacral decubitus ulcer  10. Altered mental status secondary to hypercalcemia and sundowning   The  hypercalcemia has improved. He is more alert, but remains confused. I discussed the situation with his family. The plan is to continue intravenous hydration for treatment of the hypercalcemia. Hopefully his mental status will continue to improve over the next few days. We will request a physical therapy consult. His family would like to enroll him in the home palliative care program.  We will assess his status over the next few days and decide on the indication for a trial of systemic therapy. We will consider bendamustine/rituximab versus single agent rituximab.  The family indicates they plan on taking him home. The goal is to arrange for discharge to home by early next week.     LOS: 1 day   [name], MD   06/12/2016, 1:45 PM

## 2016-06-12 NOTE — Telephone Encounter (Signed)
Oral Chemotherapy Pharmacist Encounter  Received voicemail from patient's wife with questions about rejection letter from [place] in regards to re-enrolling for support for patient's Zytiga. I called [employer] to inquire about rejection and was informed that patient must provide proof that he has spent 4% of household income out of pocket in order to be enrolled in [employer]. We also need to provide prior authorization for the Zytiga by calling [phone].  I called patient's wife to give her above information. She will contact her pharmacy for the print out which shows OOP expenses. Oral Chemo Clinic will call for PA.  Once this documentation is received oral chemo clinic will fax to [facility] with a cover letter including patient's name and DOB to [phone].  Patient's wife understands and is in agreement with above plan.  Oral Chemo Clinic will continue to follow.  [name], PharmD, BCPS 06/12/2016  4:39 PM Oral Chemotherapy Clinic [phone]

## 2016-06-13 DIAGNOSIS — I82402 Acute embolism and thrombosis of unspecified deep veins of left lower extremity: Secondary | ICD-10-CM

## 2016-06-13 DIAGNOSIS — Z7901 Long term (current) use of anticoagulants: Secondary | ICD-10-CM

## 2016-06-13 LAB — COMPREHENSIVE METABOLIC PANEL
ALBUMIN: 2.8 g/dL — AB (ref 3.5–5.0)
ALK PHOS: 64 U/L (ref 38–126)
ALT: 16 U/L — AB (ref 17–63)
AST: 24 U/L (ref 15–41)
Anion gap: 7 (ref 5–15)
BUN: 27 mg/dL — ABNORMAL HIGH (ref 6–20)
CALCIUM: 9.4 mg/dL (ref 8.9–10.3)
CHLORIDE: 114 mmol/L — AB (ref 101–111)
CO2: 21 mmol/L — AB (ref 22–32)
CREATININE: 1.45 mg/dL — AB (ref 0.61–1.24)
GFR calc non Af Amer: 42 mL/min — ABNORMAL LOW (ref >60)
GFR, EST AFRICAN AMERICAN: 48 mL/min — AB (ref >60)
GLUCOSE: 103 mg/dL — AB (ref 65–99)
Potassium: 3.8 mmol/L (ref 3.5–5.1)
SODIUM: 142 mmol/L (ref 135–145)
Total Bilirubin: 0.7 mg/dL (ref 0.3–1.2)
Total Protein: 5 g/dL — ABNORMAL LOW (ref 6.5–8.1)

## 2016-06-13 LAB — PROTIME-INR
INR: 4.16 — AB
Prothrombin Time: 41.3 seconds — ABNORMAL HIGH (ref 11.4–15.2)

## 2016-06-13 NOTE — Progress Notes (Signed)
PT Note  Patient Details Name: Edward Woodward MRN: [id number] DOB: 1926-11-26      PT checked earlier in day, and needed to check back due to nursing in room and  pt just back to bed.   Checked a second time, and nursing has done a wonderful job ambulating patient in hallway. (pr nursing report , pt ambulated with RW , however fatigued after a certain distance and had to sit in his wheelchair).   Spoke to patient's wife, due to patient just got back to bed again. Pt has all equipment, ramp etc for home, just hopes he regains a little of his strength to his PLOF.   Will attempt to check on patient later today, or if not, will check on patient tomorrow.    Clide Dales 06/13/2016, 2:48 PM  Clide Dales, PT Pager: [phone] 06/13/2016

## 2016-06-13 NOTE — Progress Notes (Signed)
IP PROGRESS NOTE  Subjective:   He is alert this morning.  No complaint.  Out of bed yesterday.  Objective: Vital signs in last 24 hours: Blood pressure 128/69, pulse 70, temperature 97.7 F (36.5 C), temperature source Oral, resp. rate 16, SpO2 94 %.  Intake/Output from previous day: 11/17 0701 - 11/18 0700 In: 1980 [P.O.:480; I.V.:1500] Out: 700 [Urine:700]  Physical Exam:  HEENT: No thrush, mouth is dry Lungs: decreased breath sounds at bases, few scattered rhonci Cardiac: Regular rate and rhythm Abdomen: Nontender, no hepatomegaly Extremities: No leg edema Neurologic: Alert, follows commands, oriented to day,month    Lab Results:  Recent Labs  06/11/16 1121 06/12/16 0436  WBC 11.7* 9.8  HGB 11.2* 9.7*  HCT 34.2* 29.4*  PLT 196 189   Calcium 9.4  BMET  Recent Labs  06/12/16 0436 06/13/16 0415  NA 141 142  K 3.5 3.8  CL 113* 114*  CO2 20* 21*  GLUCOSE 122* 103*  BUN 33* 27*  CREATININE 1.80* 1.45*  CALCIUM 9.9 9.4  PT/INR-4.16   Medications: I have reviewed the patient's current medications.  Assessment/Plan:  1. Non-Hodgkin's lymphoma-remote history of follicular lymphoma treated with chemotherapy/rituximab in 2008.  Biopsy of a retroperitoneal mass 05/13/2016 consistent with involvement by low-grade non-Hodgkin's lymphoma  2. History of prostate cancer-currently being treated with abiraterone, PSA has normalized  3.  Renal failure/bilateral hydronephrosis-placement of ureter stents 04/10/2016  4.  Hypercalcemia-most likely secondary to non-Hodgkin's lymphoma, status post pamidronate given 06/08/2016, calcitonin 06/11/2016-improved  5.  History of coronary artery disease  6.  History of congestive heart failure  7.  Chronic anemia secondary to renal failure and non-Hodgkin's lymphoma  8.  Peripheral neuropathy  9.  Sacral decubitus ulcer  10. Altered mental status secondary to hypercalcemia and sundowning  11. Left left  DVT,pulmonary embolism 04/01/2015- on coumadin, INR supratherapeutic   His mental status has improved with treatment of the hypercalcemia.  Plan to continue IVFs, increase diet and ambulation as tolerated.  PT evaluation. Plan for discharge to home 11/20.  Outpatient Rituxan next week.   LOS: 2 days   [name], MD   06/13/2016, 7:10 AM

## 2016-06-13 NOTE — Progress Notes (Signed)
ANTICOAGULATION CONSULT NOTE - Follow Up  Pharmacy Consult for Warfarin Indication: atrial fibrillation  Allergies  Allergen Reactions  . Penicillins Hives, Shortness Of Breath and Other (See Comments)    Has patient had a PCN reaction causing immediate rash, facial/tongue/throat swelling, SOB or lightheadedness with hypotension: Yes Has patient had a PCN reaction causing severe rash involving mucus membranes or skin necrosis: No Has patient had a PCN reaction that required hospitalization No Has patient had a PCN reaction occurring within the last 10 years: No If all of the above answers are "NO", then may proceed with Cephalosporin use.   . Simvastatin Other (See Comments)    Reaction:  Muscle weakness    Patient Measurements:    Vital Signs: Temp: 97.7 F (36.5 C) (11/18 0632) Temp Source: Oral (11/18 [id number]) BP: 128/69 (11/18 [id number]) Pulse Rate: 70 (11/18 0632)  Labs:  Recent Labs  06/11/16 1121 06/11/16 1525 06/12/16 0436 06/13/16 0415  HGB 11.2*  --  9.7*  --   HCT 34.2*  --  29.4*  --   PLT 196  --  189  --   LABPROT  --  31.1* 35.7* 41.3*  INR  --  2.92 3.47 4.16*  CREATININE 1.9*  --  1.80* 1.45*    Estimated Creatinine Clearance: 40.4 mL/min (by C-G formula based on SCr of 1.45 mg/dL (H)).   Medical History: Past Medical History:  Diagnosis Date  . AMD (age-related macular degeneration), bilateral   . Anemia   . Anginal pain ([place])    pts wife states pt had to use nitroglycerin this am 04/09/2016  . CAD (coronary artery disease)     NYHA Class 1B, CABG 1985  . CHF (congestive heart failure) ([place])   . Collagen vascular disease ([place])   . Collapsed lung    right   . Complication of anesthesia    pts wife states pt gets confused   . Degeneration of lumbar or lumbosacral intervertebral disc   . Depressive disorder, not elsewhere classified   . Dysrhythmia   . GERD (gastroesophageal reflux disease)   . History of blood transfusion   . History of hiatal  hernia   . HTN (hypertension)   . Hypercholesteremia   . Lower leg edema    bilat  . Multiple falls   . Myocardial infarction   . Neuromuscular disorder (HCC)    neuropathy  . Neuropathy ([place])   . Non Hodgkin's lymphoma ([place])   . OSA on CPAP   . PE (pulmonary embolism) 03/2015  . Presence of permanent cardiac pacemaker   . Prostate cancer ([place])   . Shortness of breath dyspnea    with exertion   . Sinoatrial node dysfunction (HCC)   . Ulcers of both lower legs (HCC)    history of   . Urinary incontinence     Medications:  Scheduled:  . abiraterone Acetate  1,000 mg Oral Daily  . citalopram  20 mg Oral Daily  . feeding supplement (ENSURE ENLIVE)  237 mL Oral BID BM  . isosorbide mononitrate  30 mg Oral Daily  . metoprolol succinate  25 mg Oral Daily  . polyvinyl alcohol  1 drop Both Eyes QHS  . potassium chloride  20 mEq Oral Daily  . predniSONE  5 mg Oral Daily  . Warfarin - Pharmacist Dosing Inpatient   Does not apply q1800   Infusions:  . sodium chloride 75 mL/hr at 06/12/16 1754   PRN: acetaminophen, alum & mag hydroxide-simeth, fluticasone,  nitroGLYCERIN, ondansetron (ZOFRAN) IV, polyethylene glycol, traMADol  Assessment: 82 yo male on chronic warfarin for afib admitted from [facility] per family request.  Home dose of warfarin reported as 2.5mg  daily with last dose taken 11/15.  INR is therapeutic on admission (2.92).  Pharmacy is consulted to continue dosing warfarin upon admission.  06/13/2016  INR supratherapeutic and rising  No CBC today but was down some yesterday  No reported bleeding  Goal of Therapy:  INR 2-3 Monitor platelets by anticoagulation protocol: Yes   Plan:   Hold warfarin again tonight  Daily PT/INR   Adrian Saran, PharmD, BCPS Pager [phone] 06/13/2016 10:12 AM

## 2016-06-13 NOTE — Significant Event (Signed)
CRITICAL VALUE ALERT  Critical value received:  INR-4.16  Date of notification:  06/13/16   Time of notification:  0520  Critical value read back:yes  Nurse who received alert:  Dorothyann Peng Reida Hem,rn  MD notified (1st page):  Lindi Adie  Time of first page:  0530  MD notified (2nd page):  Time of second page:  Responding MD:  Lindi Adie  Time MD responded:  [phone]

## 2016-06-14 LAB — PROTIME-INR
INR: 4.44 — AB
PROTHROMBIN TIME: 43.5 s — AB (ref 11.4–15.2)

## 2016-06-14 LAB — COMPREHENSIVE METABOLIC PANEL
ALT: 15 U/L — ABNORMAL LOW (ref 17–63)
ANION GAP: 6 (ref 5–15)
AST: 26 U/L (ref 15–41)
Albumin: 2.7 g/dL — ABNORMAL LOW (ref 3.5–5.0)
Alkaline Phosphatase: 61 U/L (ref 38–126)
BUN: 27 mg/dL — ABNORMAL HIGH (ref 6–20)
CHLORIDE: 116 mmol/L — AB (ref 101–111)
CO2: 21 mmol/L — ABNORMAL LOW (ref 22–32)
Calcium: 9.3 mg/dL (ref 8.9–10.3)
Creatinine, Ser: 1.33 mg/dL — ABNORMAL HIGH (ref 0.61–1.24)
GFR, EST AFRICAN AMERICAN: 54 mL/min — AB (ref >60)
GFR, EST NON AFRICAN AMERICAN: 46 mL/min — AB (ref >60)
Glucose, Bld: 106 mg/dL — ABNORMAL HIGH (ref 65–99)
POTASSIUM: 4.1 mmol/L (ref 3.5–5.1)
SODIUM: 143 mmol/L (ref 135–145)
Total Bilirubin: 0.6 mg/dL (ref 0.3–1.2)
Total Protein: 4.8 g/dL — ABNORMAL LOW (ref 6.5–8.1)

## 2016-06-14 NOTE — Evaluation (Signed)
Physical Therapy Evaluation Patient Details Name: Edward Woodward MRN: [id number] DOB: [birth year] Today's Date: 06/14/2016   History of Present Illness  H/O Non-Hodgkin's lymphoma, prostate cancer., CAD, CABG,afib, PE neuropathy HTN, NM d/o admitted11/16/17 from MD office with FTT, hypercalcemia.   Clinical Impression  The patient tolerated ambulating 50' x 2. Pt admitted with above diagnosis. Pt currently with functional limitations due to the deficits listed below (see PT Problem List).  Pt will benefit from skilled PT to increase their independence and safety with mobility to allow discharge to the venue listed below.       Follow Up Recommendations Home health PT;Supervision/Assistance - 24 hour    Equipment Recommendations  None recommended by PT    Recommendations for Other Services       Precautions / Restrictions Precautions Precautions: Fall      Mobility  Bed Mobility Overal bed mobility: Needs Assistance Bed Mobility: Supine to Sit;Sit to Supine     Supine to sit: Min assist Sit to supine: Min assist   General bed mobility comments: assist with trunk to pull to upright, assist with legs onto the bed.  Transfers Overall transfer level: Needs assistance Equipment used: Rolling walker (2 wheeled) Transfers: Sit to/from Stand Sit to Stand: Min assist         General transfer comment: cues for hand placement and for  pushing up from bed and arm rests.  Ambulation/Gait Ambulation/Gait assistance: Min assist Ambulation Distance (Feet): 50 Feet (x2) Assistive device: Rolling walker (2 wheeled) Gait Pattern/deviations: Decreased stride length;Step-through pattern;Shuffle     General Gait Details: gait speeds up when fatigued.  Stairs            Wheelchair Mobility    Modified Rankin (Stroke Patients Only)       Balance Overall balance assessment: History of Falls;Needs assistance Sitting-balance support: Feet supported;No upper extremity  supported Sitting balance-Leahy Scale: Fair     Standing balance support: During functional activity;Bilateral upper extremity supported Standing balance-Leahy Scale: Poor                               Pertinent Vitals/Pain Pain Assessment: No/denies pain    Home Living Family/patient expects to be discharged to:: Private residence Living Arrangements: Spouse/significant other;Children Available Help at Discharge: Family;Available 24 hours/day Type of Home: House Home Access: Ramped entrance     Home Layout: One level Home Equipment: Walker - 2 wheels;Wheelchair - manual;Bedside commode;Cane - single point      Prior Function Level of Independence: Needs assistance   Gait / Transfers Assistance Needed: was ambulating  on the street with daughter assisting 1 week ago per daughter  ADL's / Homemaking Assistance Needed: assisted with  bath and dressing most erecently due to progressive weakness.        Hand Dominance        Extremity/Trunk Assessment   Upper Extremity Assessment: Generalized weakness           Lower Extremity Assessment: Generalized weakness      Cervical / Trunk Assessment: Kyphotic  Communication      Cognition Arousal/Alertness: Awake/alert Behavior During Therapy: WFL for tasks assessed/performed;Flat affect Overall Cognitive Status: Within Functional Limits for tasks assessed                      General Comments      Exercises     Assessment/Plan    PT Assessment Patient  needs continued PT services  PT Problem List Decreased strength;Decreased activity tolerance;Decreased balance;Decreased knowledge of precautions;Decreased safety awareness;Decreased knowledge of use of DME;Decreased skin integrity          PT Treatment Interventions DME instruction;Gait training;Functional mobility training;Therapeutic activities;Therapeutic exercise;Patient/family education    PT Goals (Current goals can be found in  the Care Plan section)  Acute Rehab PT Goals Patient Stated Goal: to go home by thanksgiving PT Goal Formulation: With patient/family Time For Goal Achievement: 06/28/16 Potential to Achieve Goals: Good    Frequency Min 3X/week   Barriers to discharge        Co-evaluation               End of Session Equipment Utilized During Treatment: Gait belt Activity Tolerance: Patient tolerated treatment well Patient left: in bed;with call bell/phone within reach;with family/visitor present Nurse Communication: Mobility status         Time: [id number] PT Time Calculation (min) (ACUTE ONLY): 24 min   Charges:   PT Evaluation $PT Eval Low Complexity: 1 Procedure PT Treatments $Gait Training: 8-22 mins   PT G Codes:        Claretha Cooper 06/14/2016, 10:02 AM  Tresa Endo PT [phone]

## 2016-06-14 NOTE — Progress Notes (Signed)
ANTICOAGULATION CONSULT NOTE - Follow Up  Pharmacy Consult for Warfarin Indication: atrial fibrillation  Allergies  Allergen Reactions  . Penicillins Hives, Shortness Of Breath and Other (See Comments)    Has patient had a PCN reaction causing immediate rash, facial/tongue/throat swelling, SOB or lightheadedness with hypotension: Yes Has patient had a PCN reaction causing severe rash involving mucus membranes or skin necrosis: No Has patient had a PCN reaction that required hospitalization No Has patient had a PCN reaction occurring within the last 10 years: No If all of the above answers are "NO", then may proceed with Cephalosporin use.   . Simvastatin Other (See Comments)    Reaction:  Muscle weakness    Patient Measurements: Height: 5\' 10"  (177.8 cm) Weight: 197 lb (89.4 kg) IBW/kg (Calculated) : 73  Vital Signs: Temp: 97.6 F (36.4 C) (11/19 0612) Temp Source: Oral (11/19 0612) BP: 141/64 (11/19 0612) Pulse Rate: 65 (11/19 0612)  Labs:  Recent Labs  06/11/16 1121  06/12/16 0436 06/13/16 0415 06/14/16 0420  HGB 11.2*  --  9.7*  --   --   HCT 34.2*  --  29.4*  --   --   PLT 196  --  189  --   --   LABPROT  --   < > 35.7* 41.3* 43.5*  INR  --   < > 3.47 4.16* 4.44*  CREATININE 1.9*  --  1.80* 1.45* 1.33*  < > = values in this interval not displayed.  Estimated Creatinine Clearance: 44.1 mL/min (by C-G formula based on SCr of 1.33 mg/dL (H)).   Medical History: Past Medical History:  Diagnosis Date  . AMD (age-related macular degeneration), bilateral   . Anemia   . Anginal pain ([place])    pts wife states pt had to use nitroglycerin this am 04/09/2016  . CAD (coronary artery disease)     NYHA Class 1B, CABG 1985  . CHF (congestive heart failure) ([place])   . Collagen vascular disease ([place])   . Collapsed lung    right   . Complication of anesthesia    pts wife states pt gets confused   . Degeneration of lumbar or lumbosacral intervertebral disc   . Depressive  disorder, not elsewhere classified   . Dysrhythmia   . GERD (gastroesophageal reflux disease)   . History of blood transfusion   . History of hiatal hernia   . HTN (hypertension)   . Hypercholesteremia   . Lower leg edema    bilat  . Multiple falls   . Myocardial infarction   . Neuromuscular disorder (HCC)    neuropathy  . Neuropathy ([place])   . Non Hodgkin's lymphoma ([place])   . OSA on CPAP   . PE (pulmonary embolism) 03/2015  . Presence of permanent cardiac pacemaker   . Prostate cancer ([place])   . Shortness of breath dyspnea    with exertion   . Sinoatrial node dysfunction (HCC)   . Ulcers of both lower legs (HCC)    history of   . Urinary incontinence     Medications:  Scheduled:  . abiraterone Acetate  1,000 mg Oral Daily  . citalopram  20 mg Oral Daily  . feeding supplement (ENSURE ENLIVE)  237 mL Oral BID BM  . isosorbide mononitrate  30 mg Oral Daily  . metoprolol succinate  25 mg Oral Daily  . polyvinyl alcohol  1 drop Both Eyes QHS  . predniSONE  5 mg Oral Daily  . Warfarin - Pharmacist Dosing  Inpatient   Does not apply q1800   Infusions:  . sodium chloride 50 mL/hr at 06/14/16 1027   PRN: acetaminophen, alum & mag hydroxide-simeth, fluticasone, nitroGLYCERIN, ondansetron (ZOFRAN) IV, polyethylene glycol, traMADol  Assessment: 82 yo male on chronic warfarin for afib admitted from [facility] per family request.  Home dose of warfarin reported as 2.5mg  daily with last dose taken 11/15.  INR is therapeutic on admission (2.92).  Pharmacy is consulted to continue dosing warfarin upon admission.  06/14/2016  INR supratherapeutic and continues to rise  No CBC today but was down some 11/17  No reported bleeding  Goal of Therapy:  INR 2-3 Monitor platelets by anticoagulation protocol: Yes   Plan:   Hold warfarin again tonight  Daily PT/INR   Adrian Saran, PharmD, BCPS Pager [phone] 06/14/2016 10:38 AM

## 2016-06-14 NOTE — Progress Notes (Addendum)
CRITICAL VALUE ALERT  Critical value received:  INR 4.44   Date of notification: 06/14/16  Time of notification: 0450  Critical value read back: Yes   Nurse who received alert:  Elwin Sleight RN   MD notified (1st page):  Lindi Adie   Time of first page:  0525  MD notified (2nd page):  Time of second page:  Responding MD:  Lindi Adie   Time MD responded:  0530  MD stated to have INR rechecked Tuesday, but order is already in to check daily at 0500. Coumadin is being held as well.

## 2016-06-14 NOTE — Progress Notes (Signed)
IP PROGRESS NOTE  Subjective:   He is up in the chair eating breakfast. No complaint.  Was able to ambulate yesterday.  Objective: Vital signs in last 24 hours: Blood pressure (!) 141/64, pulse 65, temperature 97.6 F (36.4 C), temperature source Oral, resp. rate 16, height 5\' 10"  (1.778 m), weight 197 lb (89.4 kg), SpO2 92 %.  Intake/Output from previous day: 11/18 0701 - 11/19 0700 In: 2478 [P.O.:780; I.V.:1698] Out: 775 [Urine:775]  Physical Exam:  Lungs: decreased breath sounds at bases, scattered rhonci at bilateral posterior chest, no respiratory distress Cardiac: Regular rate and rhythm Abdomen: Nontender, no hepatomegaly Extremities: No leg edema Neurologic: Alert, follows commands    Lab Results:  Recent Labs  06/11/16 1121 06/12/16 0436  WBC 11.7* 9.8  HGB 11.2* 9.7*  HCT 34.2* 29.4*  PLT 196 189   Calcium 9.3  BMET  Recent Labs  06/13/16 0415 06/14/16 0420  NA 142 143  K 3.8 4.1  CL 114* 116*  CO2 21* 21*  GLUCOSE 103* 106*  BUN 27* 27*  CREATININE 1.45* 1.33*  CALCIUM 9.4 9.3  PT/INR-4.16   Medications: I have reviewed the patient's current medications.  Assessment/Plan:  1. Non-Hodgkin's lymphoma-remote history of follicular lymphoma treated with chemotherapy/rituximab in 2008.  Biopsy of a retroperitoneal mass 05/13/2016 consistent with involvement by low-grade non-Hodgkin's lymphoma  2. History of prostate cancer-currently being treated with abiraterone, PSA has normalized  3.  Renal failure/bilateral hydronephrosis-placement of ureter stents 04/10/2016  4.  Hypercalcemia-most likely secondary to non-Hodgkin's lymphoma, status post pamidronate given 06/08/2016, calcitonin 06/11/2016-improved  5.  History of coronary artery disease  6.  History of congestive heart failure  7.  Chronic anemia secondary to renal failure and non-Hodgkin's lymphoma  8.  Peripheral neuropathy  9.  Sacral decubitus ulcer  10. Altered  mental status secondary to hypercalcemia and sundowning  11. Left left DVT,pulmonary embolism 04/01/2015- on coumadin, INR supratherapeutic   His mental status continues to improve. Coumadin remains on hold. Plan for discharge to home 11/20.  Outpatient Rituxan next week.   LOS: 3 days   [name], MD   06/14/2016, 7:53 AM

## 2016-06-15 ENCOUNTER — Other Ambulatory Visit: Payer: Self-pay | Admitting: Oncology

## 2016-06-15 ENCOUNTER — Other Ambulatory Visit: Payer: Self-pay | Admitting: *Deleted

## 2016-06-15 ENCOUNTER — Other Ambulatory Visit: Payer: Self-pay | Admitting: Hematology

## 2016-06-15 ENCOUNTER — Ambulatory Visit: Payer: Medicare Other | Admitting: Nurse Practitioner

## 2016-06-15 ENCOUNTER — Other Ambulatory Visit: Payer: Medicare Other

## 2016-06-15 DIAGNOSIS — E43 Unspecified severe protein-calorie malnutrition: Secondary | ICD-10-CM | POA: Insufficient documentation

## 2016-06-15 DIAGNOSIS — C8299 Follicular lymphoma, unspecified, extranodal and solid organ sites: Secondary | ICD-10-CM

## 2016-06-15 DIAGNOSIS — C61 Malignant neoplasm of prostate: Secondary | ICD-10-CM

## 2016-06-15 LAB — COMPREHENSIVE METABOLIC PANEL
ALT: 14 U/L — ABNORMAL LOW (ref 17–63)
AST: 19 U/L (ref 15–41)
Albumin: 2.7 g/dL — ABNORMAL LOW (ref 3.5–5.0)
Alkaline Phosphatase: 60 U/L (ref 38–126)
Anion gap: 4 — ABNORMAL LOW (ref 5–15)
BILIRUBIN TOTAL: 0.5 mg/dL (ref 0.3–1.2)
BUN: 27 mg/dL — AB (ref 6–20)
CO2: 22 mmol/L (ref 22–32)
Calcium: 9.4 mg/dL (ref 8.9–10.3)
Chloride: 115 mmol/L — ABNORMAL HIGH (ref 101–111)
Creatinine, Ser: 1.39 mg/dL — ABNORMAL HIGH (ref 0.61–1.24)
GFR, EST AFRICAN AMERICAN: 51 mL/min — AB (ref >60)
GFR, EST NON AFRICAN AMERICAN: 44 mL/min — AB (ref >60)
Glucose, Bld: 102 mg/dL — ABNORMAL HIGH (ref 65–99)
POTASSIUM: 4.3 mmol/L (ref 3.5–5.1)
Sodium: 141 mmol/L (ref 135–145)
TOTAL PROTEIN: 4.9 g/dL — AB (ref 6.5–8.1)

## 2016-06-15 LAB — PROTIME-INR
INR: 2.54
PROTHROMBIN TIME: 27.8 s — AB (ref 11.4–15.2)

## 2016-06-15 MED ORDER — TRAMADOL HCL 50 MG PO TABS: 25.0000 mg | ORAL_TABLET | Freq: Four times a day (QID) | ORAL | 0 refills | Status: DC | PRN

## 2016-06-15 MED ORDER — WARFARIN SODIUM 2.5 MG PO TABS: 2.5000 mg | ORAL_TABLET | Freq: Once | ORAL | Status: DC

## 2016-06-15 MED FILL — traMADol HCL 50 MG TABS: 50 | 15 days supply | Qty: 30 | Fill #0

## 2016-06-15 NOTE — Progress Notes (Signed)
Nursing Discharge Summary  Patient ID: Edward Woodward MRN: [id number] DOB/AGE: 1927-03-22 82 y.o.  Admit date: 06/11/2016 Discharge date: 06/15/2016  Discharged Condition: good  Disposition: 01-Home or Self Care  Follow-up Information    [name], MD Follow up.   Specialty:  Oncology Why:  Office will call you with appointment times Plan is to schedule rituximab for 06/17/2016 Contact information: [place] [address] [phone]        [facility] Follow up.   Contact information: [address] [place] [address] [phone]        [facility] Follow up.   Specialty:  Hospice and Palliative Medicine Contact information: [place] [id number] [phone]           Prescriptions Given: Tramadol called into outpatient pharmacy.  Medications and follow up appointments discussed with wife Edward Woodward.  She verbalized understanding without further questions.    Means of Discharge: Patient taken downstairs in wheelchair to be discharged home via private vehicle.   Signed: Buel Ream 06/15/2016, 1:05 PM

## 2016-06-15 NOTE — Progress Notes (Signed)
ANTICOAGULATION CONSULT NOTE - Follow Up  Pharmacy Consult for Warfarin Indication: atrial fibrillation  Allergies  Allergen Reactions  . Penicillins Hives, Shortness Of Breath and Other (See Comments)    Has patient had a PCN reaction causing immediate rash, facial/tongue/throat swelling, SOB or lightheadedness with hypotension: Yes Has patient had a PCN reaction causing severe rash involving mucus membranes or skin necrosis: No Has patient had a PCN reaction that required hospitalization No Has patient had a PCN reaction occurring within the last 10 years: No If all of the above answers are "NO", then may proceed with Cephalosporin use.   . Simvastatin Other (See Comments)    Reaction:  Muscle weakness    Patient Measurements: Height: 5\' 10"  (177.8 cm) Weight: 197 lb (89.4 kg) IBW/kg (Calculated) : 73  Vital Signs: Temp: 97.9 F (36.6 C) (11/20 0600) Temp Source: Oral (11/20 0600) BP: 139/61 (11/20 0600) Pulse Rate: 65 (11/20 0600)  Labs:  Recent Labs  06/13/16 0415 06/14/16 0420 06/15/16 0344  LABPROT 41.3* 43.5* 27.8*  INR 4.16* 4.44* 2.54  CREATININE 1.45* 1.33* 1.39*    Estimated Creatinine Clearance: 42.2 mL/min (by C-G formula based on SCr of 1.39 mg/dL (H)).   Medical History: Past Medical History:  Diagnosis Date  . AMD (age-related macular degeneration), bilateral   . Anemia   . Anginal pain ([place])    pts wife states pt had to use nitroglycerin this am 04/09/2016  . CAD (coronary artery disease)     NYHA Class 1B, CABG 1985  . CHF (congestive heart failure) ([place])   . Collagen vascular disease ([place])   . Collapsed lung    right   . Complication of anesthesia    pts wife states pt gets confused   . Degeneration of lumbar or lumbosacral intervertebral disc   . Depressive disorder, not elsewhere classified   . Dysrhythmia   . GERD (gastroesophageal reflux disease)   . History of blood transfusion   . History of hiatal hernia   . HTN (hypertension)    . Hypercholesteremia   . Lower leg edema    bilat  . Multiple falls   . Myocardial infarction   . Neuromuscular disorder (HCC)    neuropathy  . Neuropathy ([place])   . Non Hodgkin's lymphoma ([place])   . OSA on CPAP   . PE (pulmonary embolism) 03/2015  . Presence of permanent cardiac pacemaker   . Prostate cancer ([place])   . Shortness of breath dyspnea    with exertion   . Sinoatrial node dysfunction (HCC)   . Ulcers of both lower legs (HCC)    history of   . Urinary incontinence     Medications:  Scheduled:  . abiraterone Acetate  1,000 mg Oral Daily  . citalopram  20 mg Oral Daily  . feeding supplement (ENSURE ENLIVE)  237 mL Oral BID BM  . isosorbide mononitrate  30 mg Oral Daily  . metoprolol succinate  25 mg Oral Daily  . polyvinyl alcohol  1 drop Both Eyes QHS  . predniSONE  5 mg Oral Daily  . Warfarin - Pharmacist Dosing Inpatient   Does not apply q1800   Infusions:  . sodium chloride 50 mL/hr at 06/14/16 1027   PRN: acetaminophen, alum & mag hydroxide-simeth, fluticasone, nitroGLYCERIN, ondansetron (ZOFRAN) IV, polyethylene glycol, traMADol  Assessment: 82 yo male on chronic warfarin for afib admitted from [facility] per family request.  Home dose of warfarin reported as 2.5mg  daily with last dose taken 11/15.  INR is therapeutic on admission (2.92).  Pharmacy is consulted to continue dosing warfarin upon admission.  06/15/2016  INR therapeutic   No CBC today but was down some 11/17  No reported bleeding  Goal of Therapy:  INR 2-3 Monitor platelets by anticoagulation protocol: Yes   Plan:   Warfarin 2.5mg  po x1 at 1800  Daily PT/INR   Dolly Rias RPh 06/15/2016, 9:52 AM Pager [phone]

## 2016-06-15 NOTE — Discharge Instructions (Signed)
Call for confusion, increased somnolence

## 2016-06-15 NOTE — Care Management Note (Signed)
Case Management Note  Patient Details  Name: Edward Woodward MRN: [id number] Date of Birth: 1927-01-23  Subjective/Objective:               82 yo admitted with Hypercalcemia of malignancy     Action/Plan: From home with spouse. CM consult for home palliative services. PT recommendations were for HHPT. This CM met with pt and wife at bedside to discuss dc plans. Wife states she is interested in both palliative care and home health services. Wife unsure which company to chose and request that I call her daughter Edward Woodward to discuss. Choice offered and [facility] was chosen for palliative care and [facility] was chosen for [facility] services. Nursing staff to call for [facility] orders. This CM called referrals to both [facility] and [facility]. Per daughter pt has all equipment needed at home. Wife plans to take pt home via personal vehicle. No other CM needs communicated.  Expected Discharge Date:   (unknown)               Expected Discharge Plan:  [place]  In-House Referral:     Discharge planning Services  CM Consult  Post Acute Care Choice:    Choice offered to:  Spouse, Adult Children  DME Arranged:    DME Agency:     HH Arranged:  RN, PT, OT, Nurse's Aide, Disease Management [place] Agency:  [place] (Palliative care with [facility])  Status of Service:  Completed, signed off  If discussed at [place] of Stay Meetings, dates discussed:    Additional CommentsLynnell Catalan, RN 06/15/2016, 10:06 AM  [phone]

## 2016-06-15 NOTE — Discharge Summary (Signed)
Physician Discharge Summary  Patient ID: IRL ABERG  MRN: [id number] DOB/AGE: Feb 05, 1927 82 y.o.  Admit date: 06/11/2016 Discharge date: 06/15/2016  Discharge Diagnoses:  Active Problems:   1. Hypercalcemia of malignancy   2. Protein-calorie malnutrition, severe   3. Non-Hodgkin's lymphoma   4. Prostate cancer   5. Renal insufficiency   6. Coronary artery disease   7. History of a DVT/pulmonary embolism    Discharged Condition:Improved    Significant Diagnostic Studies:None  Consults:None  Procedures:  None  Disposition: 01-Home or Self Care     Follow-up Information    [name], MD Follow up.   Specialty:  Oncology Why:  Office will call you with appointment times Plan is to schedule rituximab for 06/17/2016 Contact information: [place] [address] [phone]        [facility] Follow up.   Contact information: [address] [place] [address] [phone]        [facility] Follow up.   Specialty:  Hospice and Palliative Medicine Contact information: [place] [address] [place] Hospital Course: Mr. Hogrefe was admitted on 06/11/2016 with altered mental status and dehydration. He had been diagnosed with hypercalcemia 06/08/2016 and was treated with pamidronate. The hypercalcemia persisted. On hospital admission he was treated with intravenous hydration and calcitonin. The hypercalcemia resolved. His mental status improved to baseline over the next few days. The hypercalcemia is most likely related to progression of non-Hodgkin's lymphoma. Mr. Daws and his family would like to proceed with systemic treatment of the lymphoma. He will be scheduled for single agent rituximab as an outpatient.  The PT/INR was supratherapeutic on hospital admission. Coumadin was held and the INR was in the therapeutic range on the day of discharge. This will  be followed as an outpatient.  Mr. Kean was evaluated by physical therapy. He was able to ambulate short distances.  His performance status was much improved during this admission and he was stable for discharge on 06/15/2016. He will be referred to the [facility] palliative care program.      Signed: [name], MD 06/15/2016, 10:08 AM

## 2016-06-15 NOTE — Telephone Encounter (Signed)
Oral Chemotherapy Pharmacist Encounter  I called [facility] at [phone] to obtain copy of PA to send to [employer]. Zytiga approved through Oct 2019. Fax received with this information.   While on the phone I requested documentation that shows patient's out-of-pocket expenses on prescription medications for year to date 2017. They can not give me this information; it can only be shared with/sent to patient. I requested that this information be mailed to patient.  I called patient's wife [name] to update her on progress and to let her know to look out for this information from [facility]. Pharmacy OOP documentation may take 7-10 business days to arrive at patient's home. [name] will bring that information up to [facility] to me once it is received and then I will fax all requested information to [employer] to appeal his application for Zytiga assistance.  Oral Chemo Clinic will continue to follow.  [name], PharmD, BCPS 06/15/2016  3:07 PM Oral Chemotherapy Clinic [phone]

## 2016-06-16 ENCOUNTER — Telehealth: Payer: Self-pay | Admitting: *Deleted

## 2016-06-16 ENCOUNTER — Other Ambulatory Visit: Payer: Self-pay | Admitting: *Deleted

## 2016-06-16 DIAGNOSIS — C8598 Non-Hodgkin lymphoma, unspecified, lymph nodes of multiple sites: Secondary | ICD-10-CM | POA: Diagnosis not present

## 2016-06-16 DIAGNOSIS — H3530 Unspecified macular degeneration: Secondary | ICD-10-CM | POA: Diagnosis not present

## 2016-06-16 DIAGNOSIS — M5136 Other intervertebral disc degeneration, lumbar region: Secondary | ICD-10-CM | POA: Diagnosis not present

## 2016-06-16 DIAGNOSIS — I509 Heart failure, unspecified: Secondary | ICD-10-CM | POA: Diagnosis not present

## 2016-06-16 DIAGNOSIS — E785 Hyperlipidemia, unspecified: Secondary | ICD-10-CM | POA: Diagnosis not present

## 2016-06-16 DIAGNOSIS — G4733 Obstructive sleep apnea (adult) (pediatric): Secondary | ICD-10-CM | POA: Diagnosis not present

## 2016-06-16 DIAGNOSIS — Z8546 Personal history of malignant neoplasm of prostate: Secondary | ICD-10-CM | POA: Diagnosis not present

## 2016-06-16 DIAGNOSIS — E43 Unspecified severe protein-calorie malnutrition: Secondary | ICD-10-CM | POA: Diagnosis not present

## 2016-06-16 DIAGNOSIS — Z86711 Personal history of pulmonary embolism: Secondary | ICD-10-CM | POA: Diagnosis not present

## 2016-06-16 DIAGNOSIS — I2510 Atherosclerotic heart disease of native coronary artery without angina pectoris: Secondary | ICD-10-CM | POA: Diagnosis not present

## 2016-06-16 DIAGNOSIS — Z7901 Long term (current) use of anticoagulants: Secondary | ICD-10-CM | POA: Diagnosis not present

## 2016-06-16 DIAGNOSIS — K219 Gastro-esophageal reflux disease without esophagitis: Secondary | ICD-10-CM | POA: Diagnosis not present

## 2016-06-16 DIAGNOSIS — F329 Major depressive disorder, single episode, unspecified: Secondary | ICD-10-CM | POA: Diagnosis not present

## 2016-06-16 DIAGNOSIS — I110 Hypertensive heart disease with heart failure: Secondary | ICD-10-CM | POA: Diagnosis not present

## 2016-06-16 DIAGNOSIS — I252 Old myocardial infarction: Secondary | ICD-10-CM | POA: Diagnosis not present

## 2016-06-16 DIAGNOSIS — Z950 Presence of cardiac pacemaker: Secondary | ICD-10-CM | POA: Diagnosis not present

## 2016-06-16 LAB — HEPATITIS B SURFACE ANTIGEN: HEP B S AG: NEGATIVE

## 2016-06-16 LAB — HEPATITIS B CORE ANTIBODY, TOTAL: HEP B C TOTAL AB: NEGATIVE

## 2016-06-16 NOTE — Telephone Encounter (Signed)
Per LOS I have scheduled appts and called the paitent.

## 2016-06-17 ENCOUNTER — Ambulatory Visit (HOSPITAL_BASED_OUTPATIENT_CLINIC_OR_DEPARTMENT_OTHER): Payer: Medicare Other

## 2016-06-17 ENCOUNTER — Telehealth: Payer: Self-pay | Admitting: Oncology

## 2016-06-17 ENCOUNTER — Other Ambulatory Visit (HOSPITAL_BASED_OUTPATIENT_CLINIC_OR_DEPARTMENT_OTHER): Payer: Medicare Other

## 2016-06-17 ENCOUNTER — Telehealth: Payer: Self-pay | Admitting: *Deleted

## 2016-06-17 ENCOUNTER — Other Ambulatory Visit: Payer: Self-pay | Admitting: *Deleted

## 2016-06-17 VITALS — BP 136/60 | HR 65 | Temp 97.6°F | Resp 18

## 2016-06-17 DIAGNOSIS — Z7901 Long term (current) use of anticoagulants: Secondary | ICD-10-CM

## 2016-06-17 DIAGNOSIS — C8299 Follicular lymphoma, unspecified, extranodal and solid organ sites: Secondary | ICD-10-CM

## 2016-06-17 DIAGNOSIS — Z5112 Encounter for antineoplastic immunotherapy: Secondary | ICD-10-CM

## 2016-06-17 DIAGNOSIS — C8589 Other specified types of non-Hodgkin lymphoma, extranodal and solid organ sites: Secondary | ICD-10-CM | POA: Diagnosis not present

## 2016-06-17 DIAGNOSIS — C61 Malignant neoplasm of prostate: Secondary | ICD-10-CM

## 2016-06-17 LAB — PROTIME-INR
INR: 1.7 — AB (ref 2.00–3.50)
PROTIME: 20.4 s — AB (ref 10.6–13.4)

## 2016-06-17 MED ORDER — DIPHENHYDRAMINE HCL 25 MG PO CAPS
50.0000 mg | ORAL_CAPSULE | Freq: Once | ORAL | Status: AC
Start: 2016-06-17 — End: 2016-06-17
  Administered 2016-06-17: 50 mg via ORAL

## 2016-06-17 MED ORDER — FAMOTIDINE IN NACL 20-0.9 MG/50ML-% IV SOLN
20.0000 mg | Freq: Once | INTRAVENOUS | Status: AC | PRN
  Administered 2016-06-17: 20 mg via INTRAVENOUS

## 2016-06-17 MED ORDER — DIPHENHYDRAMINE HCL 25 MG PO CAPS
ORAL_CAPSULE | ORAL | Status: AC
  Filled 2016-06-17: qty 2

## 2016-06-17 MED ORDER — ONDANSETRON HCL 4 MG PO TABS: 4.0000 mg | ORAL_TABLET | Freq: Three times a day (TID) | ORAL | 0 refills | Status: AC | PRN

## 2016-06-17 MED ORDER — SODIUM CHLORIDE 0.9 % IV SOLN
Freq: Once | INTRAVENOUS | Status: AC | PRN
  Administered 2016-06-17: 12:00:00 via INTRAVENOUS

## 2016-06-17 MED ORDER — SODIUM CHLORIDE 0.9 % IV SOLN
375.0000 mg/m2 | Freq: Once | INTRAVENOUS | Status: AC
  Administered 2016-06-17: 800 mg via INTRAVENOUS
  Filled 2016-06-17: qty 50

## 2016-06-17 MED ORDER — ACETAMINOPHEN 325 MG PO TABS
ORAL_TABLET | ORAL | Status: AC
  Filled 2016-06-17: qty 2

## 2016-06-17 MED ORDER — METHYLPREDNISOLONE SODIUM SUCC 125 MG IJ SOLR
125.0000 mg | Freq: Once | INTRAMUSCULAR | Status: AC | PRN
  Administered 2016-06-17: 125 mg via INTRAVENOUS

## 2016-06-17 MED ORDER — ONDANSETRON HCL 8 MG PO TABS
8.0000 mg | ORAL_TABLET | Freq: Once | ORAL | Status: AC
  Administered 2016-06-17: 8 mg via ORAL

## 2016-06-17 MED ORDER — SODIUM CHLORIDE 0.9 % IV SOLN
Freq: Once | INTRAVENOUS | Status: AC
  Administered 2016-06-17: 10:00:00 via INTRAVENOUS

## 2016-06-17 MED ORDER — ONDANSETRON HCL 8 MG PO TABS
ORAL_TABLET | ORAL | Status: AC
  Filled 2016-06-17: qty 1

## 2016-06-17 MED ORDER — ACETAMINOPHEN 325 MG PO TABS
650.0000 mg | ORAL_TABLET | Freq: Once | ORAL | Status: AC
  Administered 2016-06-17: 650 mg via ORAL

## 2016-06-17 MED FILL — ONDANSETRON HCL 4 MG TABLET: 4 | 7 days supply | Qty: 20 | Fill #0

## 2016-06-17 NOTE — Telephone Encounter (Signed)
Call received from [facility] with [facility] to confirm patient's admission to [place].  Call placed back to [facility] to confirm that order for palliative care has been placed per Dr. Benay Spice.

## 2016-06-17 NOTE — Telephone Encounter (Signed)
Appointments complete per 11/20 and 11/22 schedule messages. Per infusion supervisor tx can be added for 11/29 - no room 11/30.   Patient in infusion area - spoke with charge nurse patient will be given appointments in infusion.

## 2016-06-17 NOTE — Patient Instructions (Addendum)
[place] Discharge Instructions for Patients Receiving Chemotherapy  Today you received the following chemotherapy agents: Rituxan   To help prevent nausea and vomiting after your treatment, we encourage you to take your nausea medication as directed.    If you develop nausea and vomiting that is not controlled by your nausea medication, call the clinic.   BELOW ARE SYMPTOMS THAT SHOULD BE REPORTED IMMEDIATELY:  *FEVER GREATER THAN 100.5 F  *CHILLS WITH OR WITHOUT FEVER  NAUSEA AND VOMITING THAT IS NOT CONTROLLED WITH YOUR NAUSEA MEDICATION  *UNUSUAL SHORTNESS OF BREATH  *UNUSUAL BRUISING OR BLEEDING  TENDERNESS IN MOUTH AND THROAT WITH OR WITHOUT PRESENCE OF ULCERS  *URINARY PROBLEMS  *BOWEL PROBLEMS  UNUSUAL RASH Items with * indicate a potential emergency and should be followed up as soon as possible.  Feel free to call the clinic you have any questions or concerns. The clinic phone number is [phone].  Please show the [facility] at check-in to the Emergency Department and triage nurse.     Rituximab injection What is this medicine? RITUXIMAB (ri TUX i mab) is a monoclonal antibody. It is used to treat non-Hodgkin lymphoma and chronic lymphocytic leukemia. It is also used to treat rheumatoid arthritis (RA). In RA, this medicine slows the inflammatory process and help reduce joint pain and swelling. This medicine is often used with other cancer or arthritis medications. This medicine may be used for other purposes; ask your health care provider or pharmacist if you have questions. COMMON BRAND NAME(S): Rituxan What should I tell my health care provider before I take this medicine? They need to know if you have any of these conditions: -heart disease -infection (especially a virus infection such as hepatitis B, chickenpox, cold sores, or herpes) -immune system problems -irregular heartbeat -kidney disease -lung or breathing disease, like  asthma -recently received or scheduled to receive a vaccine -an unusual or allergic reaction to rituximab, mouse proteins, other medicines, foods, dyes, or preservatives -pregnant or trying to get pregnant -breast-feeding How should I use this medicine? This medicine is for infusion into a vein. It is administered in a hospital or clinic by a specially trained health care professional. A special MedGuide will be given to you by the pharmacist with each prescription and refill. Be sure to read this information carefully each time. Talk to your pediatrician regarding the use of this medicine in children. This medicine is not approved for use in children. Overdosage: If you think you have taken too much of this medicine contact a poison control center or emergency room at once. NOTE: This medicine is only for you. Do not share this medicine with others. What if I miss a dose? It is important not to miss a dose. Call your doctor or health care professional if you are unable to keep an appointment. What may interact with this medicine? -cisplatin -medicines for blood pressure -some other medicines for arthritis -vaccines This list may not describe all possible interactions. Give your health care provider a list of all the medicines, herbs, non-prescription drugs, or dietary supplements you use. Also tell them if you smoke, drink alcohol, or use illegal drugs. Some items may interact with your medicine. What should I watch for while using this medicine? Report any side effects that you notice during your treatment right away, such as changes in your breathing, fever, chills, dizziness or lightheadedness. These effects are more common with the first dose. Visit your prescriber or health care professional  for checks on your progress. You will need to have regular blood work. Report any other side effects. The side effects of this medicine can continue after you finish your treatment. Continue your  course of treatment even though you feel ill unless your doctor tells you to stop. Call your doctor or health care professional for advice if you get a fever, chills or sore throat, or other symptoms of a cold or flu. Do not treat yourself. This drug decreases your body's ability to fight infections. Try to avoid being around people who are sick. This medicine may increase your risk to bruise or bleed. Call your doctor or health care professional if you notice any unusual bleeding. Be careful brushing and flossing your teeth or using a toothpick because you may get an infection or bleed more easily. If you have any dental work done, tell your dentist you are receiving this medicine. Avoid taking products that contain aspirin, acetaminophen, ibuprofen, naproxen, or ketoprofen unless instructed by your doctor. These medicines may hide a fever. Do not become pregnant while taking this medicine. Women should inform their doctor if they wish to become pregnant or think they might be pregnant. There is a potential for serious side effects to an unborn child. Talk to your health care professional or pharmacist for more information. Do not breast-feed an infant while taking this medicine. What side effects may I notice from receiving this medicine? Side effects that you should report to your doctor or health care professional as soon as possible: -allergic reactions like skin rash, itching or hives, swelling of the face, lips, or tongue -low blood counts - this medicine may decrease the number of white blood cells, red blood cells and platelets. You may be at increased risk for infections and bleeding. -signs of infection - fever or chills, cough, sore throat, pain or difficulty passing urine -signs of decreased platelets or bleeding - bruising, pinpoint red spots on the skin, black, tarry stools, blood in the urine -signs of decreased red blood cells - unusually weak or tired, fainting spells,  lightheadedness -breathing problems -confused, not responsive -chest pain -fast, irregular heartbeat -feeling faint or lightheaded, falls -mouth sores -redness, blistering, peeling or loosening of the skin, including inside the mouth -stomach pain -swelling of the ankles, feet, or hands -trouble passing urine or change in the amount of urine Side effects that usually do not require medical attention (report to your doctor or health care professional if they continue or are bothersome): -anxiety -headache -loss of appetite -muscle aches -nausea -night sweats This list may not describe all possible side effects. Call your doctor for medical advice about side effects. You may report side effects to FDA at 1-800-FDA-1088. Where should I keep my medicine? This drug is given in a hospital or clinic and will not be stored at home. NOTE: This sheet is a summary. It may not cover all possible information. If you have questions about this medicine, talk to your doctor, pharmacist, or health care provider.  2017 Elsevier/Gold Standard (2016-01-23 17:23:26)

## 2016-06-17 NOTE — Progress Notes (Signed)
Pt to continue current Coumadin dose at 2.5 mg daily per Dr. Benay Spice.  LauraBeth RN notified in infusion room to inform patient from order of Dr. Benay Spice.

## 2016-06-17 NOTE — Progress Notes (Signed)
Nutrition Assessment   Reason for Assessment:   Patient identified on Malnutrition Screening Report for weight loss and poor appetite.  ASSESSMENT:  82 year old male with recent diagnosis of non-hodgkin's lymphoma.  Recent hospital admission for hypercalcemia.  Patient seen today during first infusion of rituximab.   Past medical history of prostate cancer, CAD, CHF, sacral decubitus stage III, anemia, PVD, HTN, HLD.  Wife reports appetite has been decreased for the last 2-3 months, eating bites of foods.  Since discharge from the hospital on 11/20 wife reports appetite has picked up a little bit, ate good breakfast and lunch yesterday but little dinner and egg this am before coming to treatment.  Has been able to take ensure plus and [employer] brand supplement.  Patient reports some nausea this am.  Has not had bowel movement in the last 3 days.  Infusion RN to touch base with MD regarding bowel regimen and medications for nausea.    Medications: reviewed  Labs: BUN 27, creatinine 1.39, glucose 102   Athropometrics:   Height: 70 inches Weight: 197 pounds UBW: 235 pounds per wife last at that weight in June 2017 BMI: 28.2   Estimated Energy Needs  Kcals: 2000-2200 calories/d Protein: 106-133 g/d Fluid: 2.2 L/d  NUTRITION DIAGNOSIS:  Inadequate oral intake related to poor appetite, cancer as evidenced by significant weight loss of 16% in the last 5 months.     MALNUTRITION DIAGNOSIS: Patient meets criteria for severe malnutrition in chronic illness as evidenced by < or equal to 75% estimated energy requirement for > or equal to 1 month and 16%  weight loss in the last 5 months.    INTERVENTION:   Discussed ways to prevent constipation with the use of foods.  Handout provided. RN has discussed medication regiment with wife and patient to promote BM as well today.   Discussed and provided handout with recipes on increasing calories and protein.  Coupons provided to wife for  oral nutrition supplements.  Teach back method used.      MONITORING, EVALUATION, GOAL: Patient will tolerate increased calories and protein to prevent further weight loss.     NEXT VISIT: as needed  Edward Woodward, [place], [place] (pager)

## 2016-06-17 NOTE — Progress Notes (Signed)
1150: Patient began complaining of "feeling colder", was visibly shacking and had facial flushing. Rituxan stopped and normal saline started to gravity.  1151: Selena Lesser, NP notified and then at bedside at 1154. Per Selena Lesser 125mg  solu-medrol and 20mg  of pepcid given.  1220: Symptoms still remaining and Cyndee Bacon at bedside to assess. No further orders at this time.  1245: Per Tanya per Dr. Benay Spice okay to proceed with treatment at previous rate with slow increases. Patient will continue to be monitored closely.

## 2016-06-21 ENCOUNTER — Other Ambulatory Visit: Payer: Self-pay | Admitting: Oncology

## 2016-06-22 ENCOUNTER — Telehealth: Payer: Self-pay | Admitting: *Deleted

## 2016-06-22 ENCOUNTER — Encounter: Payer: Self-pay | Admitting: Interventional Cardiology

## 2016-06-22 ENCOUNTER — Other Ambulatory Visit: Payer: Self-pay | Admitting: Neurology

## 2016-06-22 NOTE — Telephone Encounter (Addendum)
Spoke with pt's wife: "He seems to be doing better every day, not as well as we hoped." Pt had no shaking chills or fever after leaving treatment. She reports they have been pushing PO fluids. He is using walker at home. Reviewed 11/29 appts. She had questions re: antiemetics. Reviewed instructions, use PRN only.

## 2016-06-22 NOTE — Telephone Encounter (Signed)
-----   Message from Beatriz Chancellor, RN sent at 06/17/2016  4:19 PM EST ----- Regarding: Pt of Dr. Ammie Dalton Chemo Follow Up Call Patient of Dr. Benay Spice first time rituxan. Patient had mild reaction with chills, shacking and facial flushing. Pt tolerated the rest of infusion well after restart.

## 2016-06-22 NOTE — Telephone Encounter (Signed)
Rx sent 

## 2016-06-22 NOTE — Telephone Encounter (Signed)
Oral Chemotherapy Pharmacist Encounter  I spoke with patient's wife this afternoon, she has received the documentation from [facility] detailing their OOP expenses on medications for 2017. She will either fax this information to me at [phone] or bring it with her this coming Thursday (11/30) when patient is here for next infusion.  Once I receive this OOP information, I will fax it and prior authorization documentation to [employer] for Zytiga appeal.  Oral Oncology Clinic will continue to follow.  [name], PharmD, BCPS, BCOP 06/22/2016  12:47 PM Oral Oncology Clinic [phone]

## 2016-06-24 ENCOUNTER — Ambulatory Visit (HOSPITAL_BASED_OUTPATIENT_CLINIC_OR_DEPARTMENT_OTHER): Payer: Medicare Other | Admitting: Nurse Practitioner

## 2016-06-24 ENCOUNTER — Other Ambulatory Visit (HOSPITAL_BASED_OUTPATIENT_CLINIC_OR_DEPARTMENT_OTHER): Payer: Medicare Other

## 2016-06-24 ENCOUNTER — Telehealth: Payer: Self-pay | Admitting: Oncology

## 2016-06-24 ENCOUNTER — Ambulatory Visit (HOSPITAL_BASED_OUTPATIENT_CLINIC_OR_DEPARTMENT_OTHER): Payer: Medicare Other

## 2016-06-24 VITALS — BP 125/58 | HR 62 | Temp 97.4°F | Resp 17 | Ht 70.0 in | Wt 202.0 lb

## 2016-06-24 VITALS — BP 123/66 | HR 70 | Temp 97.8°F | Resp 20

## 2016-06-24 DIAGNOSIS — C8299 Follicular lymphoma, unspecified, extranodal and solid organ sites: Secondary | ICD-10-CM

## 2016-06-24 DIAGNOSIS — D630 Anemia in neoplastic disease: Secondary | ICD-10-CM

## 2016-06-24 DIAGNOSIS — N289 Disorder of kidney and ureter, unspecified: Secondary | ICD-10-CM | POA: Diagnosis not present

## 2016-06-24 DIAGNOSIS — C8223 Follicular lymphoma grade III, unspecified, intra-abdominal lymph nodes: Secondary | ICD-10-CM

## 2016-06-24 DIAGNOSIS — Z7901 Long term (current) use of anticoagulants: Secondary | ICD-10-CM

## 2016-06-24 DIAGNOSIS — C61 Malignant neoplasm of prostate: Secondary | ICD-10-CM | POA: Diagnosis not present

## 2016-06-24 DIAGNOSIS — Z5112 Encounter for antineoplastic immunotherapy: Secondary | ICD-10-CM

## 2016-06-24 LAB — CBC WITH DIFFERENTIAL/PLATELET
BASO%: 0.2 % (ref 0.0–2.0)
Basophils Absolute: 0 10*3/uL (ref 0.0–0.1)
EOS ABS: 0.4 10*3/uL (ref 0.0–0.5)
EOS%: 4 % (ref 0.0–7.0)
HEMATOCRIT: 31.6 % — AB (ref 38.4–49.9)
HEMOGLOBIN: 10.4 g/dL — AB (ref 13.0–17.1)
LYMPH#: 1.4 10*3/uL (ref 0.9–3.3)
LYMPH%: 15.9 % (ref 14.0–49.0)
MCH: 29.5 pg (ref 27.2–33.4)
MCHC: 32.9 g/dL (ref 32.0–36.0)
MCV: 89.5 fL (ref 79.3–98.0)
MONO#: 0.6 10*3/uL (ref 0.1–0.9)
MONO%: 7 % (ref 0.0–14.0)
NEUT%: 72.9 % (ref 39.0–75.0)
NEUTROS ABS: 6.6 10*3/uL — AB (ref 1.5–6.5)
PLATELETS: 200 10*3/uL (ref 140–400)
RBC: 3.53 10*6/uL — ABNORMAL LOW (ref 4.20–5.82)
RDW: 15.9 % — ABNORMAL HIGH (ref 11.0–14.6)
WBC: 9 10*3/uL (ref 4.0–10.3)
nRBC: 0 % (ref 0–0)

## 2016-06-24 LAB — COMPREHENSIVE METABOLIC PANEL
ALT: 12 U/L (ref 0–55)
AST: 19 U/L (ref 5–34)
Albumin: 3 g/dL — ABNORMAL LOW (ref 3.5–5.0)
Alkaline Phosphatase: 72 U/L (ref 40–150)
Anion Gap: 9 mEq/L (ref 3–11)
BUN: 21.7 mg/dL (ref 7.0–26.0)
CHLORIDE: 106 meq/L (ref 98–109)
CO2: 23 meq/L (ref 22–29)
CREATININE: 1.4 mg/dL — AB (ref 0.7–1.3)
Calcium: 9.6 mg/dL (ref 8.4–10.4)
EGFR: 46 mL/min/{1.73_m2} — ABNORMAL LOW (ref >90)
GLUCOSE: 122 mg/dL (ref 70–140)
Potassium: 3.9 mEq/L (ref 3.5–5.1)
SODIUM: 138 meq/L (ref 136–145)
Total Bilirubin: 0.55 mg/dL (ref 0.20–1.20)
Total Protein: 5.4 g/dL — ABNORMAL LOW (ref 6.4–8.3)

## 2016-06-24 LAB — PROTIME-INR
INR: 1.3 — AB (ref 2.00–3.50)
Protime: 15.6 Seconds — ABNORMAL HIGH (ref 10.6–13.4)

## 2016-06-24 MED ORDER — ACETAMINOPHEN 325 MG PO TABS
650.0000 mg | ORAL_TABLET | Freq: Once | ORAL | Status: AC
  Administered 2016-06-24: 650 mg via ORAL

## 2016-06-24 MED ORDER — DIPHENHYDRAMINE HCL 25 MG PO CAPS
50.0000 mg | ORAL_CAPSULE | Freq: Once | ORAL | Status: AC
  Administered 2016-06-24: 50 mg via ORAL

## 2016-06-24 MED ORDER — SODIUM CHLORIDE 0.9 % IV SOLN
Freq: Once | INTRAVENOUS | Status: AC
  Administered 2016-06-24: 10:00:00 via INTRAVENOUS

## 2016-06-24 MED ORDER — DIPHENHYDRAMINE HCL 25 MG PO CAPS
ORAL_CAPSULE | ORAL | Status: AC
  Filled 2016-06-24: qty 2

## 2016-06-24 MED ORDER — SODIUM CHLORIDE 0.9 % IV SOLN
375.0000 mg/m2 | Freq: Once | INTRAVENOUS | Status: AC
  Administered 2016-06-24: 800 mg via INTRAVENOUS
  Filled 2016-06-24: qty 50

## 2016-06-24 MED ORDER — ACETAMINOPHEN 325 MG PO TABS
ORAL_TABLET | ORAL | Status: AC
  Filled 2016-06-24: qty 2

## 2016-06-24 NOTE — Progress Notes (Signed)
[facility] OFFICE PROGRESS NOTE   Diagnosis:  Non-Hodgkin's lymphoma, prostate cancer   INTERVAL HISTORY:   [facility] returns as scheduled. He completed cycle 1 weekly Rituxan 06/17/2016. He had shaking chills during the infusion. The infusion was able to be completed. No rash, shortness of breath or chest pain. His wife feels his overall condition is slowly improving. He is ambulating with a walker. Appetite is better. He is less confused. Wife notes he is more conversant. No fevers or sweats.  Objective:  Vital signs in last 24 hours:  Blood pressure (!) 125/58, pulse 62, temperature 97.4 F (36.3 C), temperature source Oral, resp. rate 17, height 5\' 10"  (1.778 m), weight 202 lb (91.6 kg), SpO2 100 %.    HEENT: No thrush or ulcers. Lymphatics: Firm fullness left low neck/scalene region Resp: Lungs clear bilaterally. Cardio: Regular rate and rhythm. GI: Abdomen soft and nontender. Vascular: Pitting edema at the lower legs bilaterally. Neuro: Alert, oriented. Follows commands.    Lab Results:  Lab Results  Component Value Date   WBC 9.8 06/12/2016   HGB 9.7 (L) 06/12/2016   HCT 29.4 (L) 06/12/2016   MCV 90.7 06/12/2016   PLT 189 06/12/2016   NEUTROABS 7.7 (H) 06/11/2016    Imaging:  No results found.  Medications: I have reviewed the patient's current medications.  Assessment/Plan: 1. Non-Hodgkin's lymphoma (follicular grade 3 lymphoma) - status post 6 cycles of Cytoxan/prednisone/rituximab 07/01/2007 through 10/21/2007.   Biopsy of a retroperitoneal mass 05/13/2016 consistent with involvement by low-grade non-Hodgkin's lymphoma  Initiation of weekly Rituxan 06/17/2016 2. CT chest 04/01/2015 with no lymphadenopathy  CT abdomen/pelvis 05/01/2015 with mild progression of abdomen/pelvic lymphadenopathy 3. Prostate cancer - he has been treated with hormonal therapy, the PSA was stable on 10/15/2014  PSA increased 04/26/2015   Bone scan  05/09/2015 with unchanged increased activity in the thoracic and lumbar spine felt to most likely be degenerative.   Initiation of Abiraterone and prednisone 05/11/2015. Normalization of the PSA on Abiraterone. 4. History of a normocytic anemia secondary to non-Hodgkin's lymphoma, chemotherapy, and renal insufficiency - progressive secondary to GI bleeding, stable 5. History of Mild thrombocytopenia  6. History of bilateral hydronephrosis. Renal ultrasound 09/26/2014 consistent with medical renal disease. No hydronephrosis.  7. Coronary artery disease - status post coronary artery stent placement. 8. History of noncalcified lung nodules. 9. History of severe neutropenia July 2009 - likely related to delayed toxicity from rituximab. 10. Macular degeneration. 11. Right middle lobe density on a CT of the chest 06/18/2010 measuring 2.5 x 1.6 cm with mildly increased metabolic activity (SUV max 2.5) on a PET scan 06/27/2010. The area of increased density was less confluent and appeared smaller on a restaging CT 09/08/2010. Right middle lobe "scarring" with no new or suspicious pulmonary nodule on a CT 08/04/2011. 12. Mild increased metabolic activity associated with several lymph nodes on the PET scan 06/27/2010 - likely related to lymphoma. No lymphadenopathy in the mediastinum or axillary areas on the CT 08/04/2011. 13. Right hip discomfort-he received a steroid injection by his primary physician without improvement. He saw Dr. Collier Salina and there was a question of a lytic process in the right pelvis. A bone scan on 09/21/2012 revealed no evidence of metastatic disease. 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 Renal insufficiency-progressive CT 01/12/2016 confirmed progressive hydronephrosis  Placement of bilateral ureter stents 04/10/2016 15. Left leg DVT and pulmonary embolism 04/01/2015-on Coumadin 16. Admission 08/23/2015 with severe anemia and GI  bleeding  Upper and lower endoscopy 08/26/2015 without a clear source for bleeding identified 17. Leg edema/anasarca-improved with leg wrapping and diuretics 18. Anemia secondary to chronic disease, renal failure, and possibly lymphoma 19. 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   Biopsy retroperitoneal soft tissue 05/13/2016 20. History of elevated calcium-most likely secondary to non-Hodgkin's lymphoma status post pamidronate 06/08/2016, calcitonin 06/11/2016  Disposition: [facility] appears improved. His performance status is better. He has completed 1 weekly treatment with Rituxan. Plan to proceed with week 2 today as scheduled. He will return for a follow-up visit and week 3 on 07/02/2016. He will contact the office in the interim with any problems.  Plan reviewed with Dr. Benay Spice.    [facility] ANP/GNP-BC   06/24/2016  9:15 AM

## 2016-06-24 NOTE — Telephone Encounter (Signed)
Message sent to chemo scheduler to be added, also for a bed request, per 06/24/16 los. Lattie Haw will see patient @ 11:15 on 12/14, per Lattie Haw (verbal). Appointments scheduled per 06/24/16 los. A copy of the AVS report and appointment schedule was given to patient, per 06/24/16 los.

## 2016-06-24 NOTE — Telephone Encounter (Signed)
Patient will receive copy of appointment schedule and AVS report, per 06/24/16 los, from Infusion area, per [place].

## 2016-06-24 NOTE — Progress Notes (Signed)
Advanced directives  (living will, healthcare power of attorney )received from patient, copies placed to be scanned.

## 2016-06-24 NOTE — Patient Instructions (Signed)
[facility] Discharge Instructions for Patients Receiving Chemotherapy  Today you received the following chemotherapy agents: Rituxan   To help prevent nausea and vomiting after your treatment, we encourage you to take your nausea medication as directed.    If you develop nausea and vomiting that is not controlled by your nausea medication, call the clinic.   BELOW ARE SYMPTOMS THAT SHOULD BE REPORTED IMMEDIATELY:  *FEVER GREATER THAN 100.5 F  *CHILLS WITH OR WITHOUT FEVER  NAUSEA AND VOMITING THAT IS NOT CONTROLLED WITH YOUR NAUSEA MEDICATION  *UNUSUAL SHORTNESS OF BREATH  *UNUSUAL BRUISING OR BLEEDING  TENDERNESS IN MOUTH AND THROAT WITH OR WITHOUT PRESENCE OF ULCERS  *URINARY PROBLEMS  *BOWEL PROBLEMS  UNUSUAL RASH Items with * indicate a potential emergency and should be followed up as soon as possible.  Feel free to call the clinic you have any questions or concerns. The clinic phone number is [phone].  Please show the CHEMO ALERT CARD at check-in to the Emergency Department and triage nurse.   

## 2016-06-25 ENCOUNTER — Telehealth: Payer: Self-pay | Admitting: *Deleted

## 2016-06-25 ENCOUNTER — Encounter: Payer: Self-pay | Admitting: Pharmacist

## 2016-06-25 DIAGNOSIS — R531 Weakness: Secondary | ICD-10-CM | POA: Diagnosis not present

## 2016-06-25 NOTE — Telephone Encounter (Signed)
Per LOS I  Have scheduled appts and notified the scheduler 

## 2016-06-26 ENCOUNTER — Other Ambulatory Visit: Payer: Self-pay | Admitting: *Deleted

## 2016-06-26 ENCOUNTER — Encounter: Payer: Self-pay | Admitting: Neurology

## 2016-06-26 ENCOUNTER — Ambulatory Visit (INDEPENDENT_AMBULATORY_CARE_PROVIDER_SITE_OTHER): Payer: Medicare Other | Admitting: Neurology

## 2016-06-26 VITALS — BP 126/50 | HR 70 | Ht 70.0 in | Wt 202.0 lb

## 2016-06-26 DIAGNOSIS — I2510 Atherosclerotic heart disease of native coronary artery without angina pectoris: Secondary | ICD-10-CM | POA: Diagnosis not present

## 2016-06-26 DIAGNOSIS — E785 Hyperlipidemia, unspecified: Secondary | ICD-10-CM | POA: Diagnosis not present

## 2016-06-26 DIAGNOSIS — I509 Heart failure, unspecified: Secondary | ICD-10-CM | POA: Diagnosis not present

## 2016-06-26 DIAGNOSIS — C8299 Follicular lymphoma, unspecified, extranodal and solid organ sites: Secondary | ICD-10-CM

## 2016-06-26 DIAGNOSIS — R2681 Unsteadiness on feet: Secondary | ICD-10-CM | POA: Diagnosis not present

## 2016-06-26 DIAGNOSIS — I25810 Atherosclerosis of coronary artery bypass graft(s) without angina pectoris: Secondary | ICD-10-CM | POA: Diagnosis not present

## 2016-06-26 DIAGNOSIS — F0390 Unspecified dementia without behavioral disturbance: Secondary | ICD-10-CM

## 2016-06-26 DIAGNOSIS — G253 Myoclonus: Secondary | ICD-10-CM

## 2016-06-26 DIAGNOSIS — E43 Unspecified severe protein-calorie malnutrition: Secondary | ICD-10-CM | POA: Diagnosis not present

## 2016-06-26 DIAGNOSIS — I110 Hypertensive heart disease with heart failure: Secondary | ICD-10-CM | POA: Diagnosis not present

## 2016-06-26 DIAGNOSIS — C8598 Non-Hodgkin lymphoma, unspecified, lymph nodes of multiple sites: Secondary | ICD-10-CM | POA: Diagnosis not present

## 2016-06-26 NOTE — Progress Notes (Signed)
Follow-up Visit   Date: 06/26/16    Edward RIMANDO MRN: [id number] DOB: [birth year]   Interim History: [facility] is a 82 y.o. right-handed Caucasian male with prostate cancer, non-Hodgkin's lymphoma on chemotherapy, depression, CAD status post CABG, atrial fibrillation on Coumadin, sinus node dysfunction s/p PPM, history of DVT and PE, and lumbar surgery for stenosis  returning to [facility] for follow-up of abnormal movements.  The patient was accompanied to the [facility] by wife who also provides collateral information.    History of present illness: Patient has noticed a steady decline in function over the past year.  He was previously walking with a rollator and able to bath himself, but since early 2016, he has started to need assistance from his wife.  In mid-June, patient condition progressed where he was unable to walk, began falling more, hallucinating, limbs jerking, and felt increasingly weak.  He was admitted to [facility] on 6/18 -6/23 for confusion, arm shaking, and poor PO intake. Wife states that he had a lot of jerking of his arms and trembling of the leg.  Mental status change was secondary to toxic-metabolic in setting of electrolyte derangements (hypokalemia) and acute kidney injury.  CT of the abdomen was remarkable for interval progression of a retroperitoneal/periaortic mass with encasement of the aorta suggestive of retroperitoneal fibrosis, adenopathy, or lymphoma. Also noted on CT abdomen is bilateral hydronephrosis which has worsened since the prior study and is likely secondary to compression of the proximal ureters by the aforementioned mass. He was treated conservatively, repleted with electrotyles, and given IV hydration.  He eventually showed improvement and was discharged back to SNF, where he slowly noticed slight improvement of the abnormal movements. He has been at home since July 12th. He continues to have jerking movements of the hands, but overall  significantly improved from before.  He currently uses a walker and wheelchair.  He was previously using a rollator, but was told at rehab, he was moving too fast with it.  Since returning home, his confusion has improved.  He is able to feed himself.  His wife assists him for showers.  Over the last year, he has become much more forgetful.   He has previously been evaluated at [facility] in 2006 at which time he was diagnosed by EMG with multiple chronic severe radiculopathies with evidence of ongoing denervation in the bilateral gastrocnemius muscles and superimposed mild axonal polyneuropathy. There was concern for hardware slippage from his prior lumbar surgery and he was evaluated by his neurosurgeon, Dr. Ellene Route in [facility], and was told he had 2 screws that were loose. He elected not to undergo further surgery at that time. More recently, he was evaluated in 2013 for falls and imbalance and diagnosed with idiopathic neuropathy and due to clinical symptoms suggestive of Parkinson's disease, also offered a trail of sinemet 25/100mg  TID.  Patient and his wife do not recall if he started sinemet or whether the diagnosis of PD was discussed.   UPDATE 06/26/2016:  He was diagnosed with lymphoma since he was last here and after undergoing chemotherapy, his wife feels that his mentation is improved, saying that they even had a conversation last week.  He remains forgetful about people and events.  He does not have delusional thoughts like before.  He noticed marked improvement of his myoclonic jerks since being on clonazepam 0.25mg  bid and now only has intermittent hand tremors.  He is able to feed himself and drink  coffee without making a mess.  He is getting home PT to help improve his gait and currently uses a walker and wheelchair.   Medications:  Current Outpatient Prescriptions on File Prior to Visit  Medication Sig Dispense Refill  . abiraterone Acetate (ZYTIGA) 250 MG tablet Take  4 tablets (1,000 mg total) by mouth daily. Take on an empty stomach 1 hour before or 2 hours after a meal 120 tablet 1  . acetaminophen (TYLENOL) 500 MG tablet Take 1,000 mg by mouth every 6 (six) hours as needed for mild pain, moderate pain or headache.     Marland Kitchen atorvastatin (LIPITOR) 40 MG tablet Take 40 mg by mouth every other day.    . benzocaine (ORAJEL) 10 % mucosal gel Use as directed 1 application in the mouth or throat as needed for mouth pain.    . citalopram (CELEXA) 20 MG tablet Take 20 mg by mouth at bedtime.     . clonazePAM (KLONOPIN) 0.25 MG disintegrating tablet DISSOLVE ONE-HALF TABLET IN MOUTH TWICE DAILY 30 tablet 1  . fluticasone (FLONASE) 50 MCG/ACT nasal spray Place 1 spray into both nostrils daily as needed for rhinitis.    Marland Kitchen isosorbide mononitrate (IMDUR) 30 MG 24 hr tablet Take 30 mg by mouth at bedtime.    . Lutein-Zeaxanthin 25-5 MG CAPS Take 1 capsule by mouth daily.     . magnesium hydroxide (MILK OF MAGNESIA) 400 MG/5ML suspension Take 5 mLs by mouth daily as needed for mild constipation.     . Melatonin 3 MG TABS Take 3 mg by mouth at bedtime.    . metoprolol succinate (TOPROL-XL) 50 MG 24 hr tablet Take 25 mg by mouth at bedtime. Take with or immediately following a meal.    . nitroGLYCERIN (NITROLINGUAL) 0.4 MG/SPRAY spray Place 1 spray under the tongue every 5 (five) minutes x 3 doses as needed for chest pain. 12 g 3  . ondansetron (ZOFRAN) 4 MG tablet Take 1 tablet (4 mg total) by mouth every 8 (eight) hours as needed for nausea or vomiting. 20 tablet 0  . Polyethyl Glycol-Propyl Glycol (SYSTANE) 0.4-0.3 % SOLN Place 1 drop into both eyes at bedtime.     . predniSONE (DELTASONE) 5 MG tablet Take 5 mg by mouth daily with breakfast.    . torsemide (DEMADEX) 20 MG tablet Take 10 mg by mouth 2 (two) times daily.     . traMADol (ULTRAM) 50 MG tablet Take 0.5 tablets (25 mg total) by mouth every 6 (six) hours as needed for moderate pain. 30 tablet 0  . vitamin B-12  (CYANOCOBALAMIN) 1000 MCG tablet Take 1,000 mcg by mouth daily.    Marland Kitchen warfarin (COUMADIN) 5 MG tablet Take 2.5 mg by mouth at bedtime.     No current facility-administered medications on file prior to visit.     Allergies:  Allergies  Allergen Reactions  . Penicillins Hives, Shortness Of Breath and Other (See Comments)    Has patient had a PCN reaction causing immediate rash, facial/tongue/throat swelling, SOB or lightheadedness with hypotension: Yes Has patient had a PCN reaction causing severe rash involving mucus membranes or skin necrosis: No Has patient had a PCN reaction that required hospitalization No Has patient had a PCN reaction occurring within the last 10 years: No If all of the above answers are "NO", then may proceed with Cephalosporin use.   . Simvastatin Other (See Comments)    Reaction:  Muscle weakness    Review of Systems:  CONSTITUTIONAL: No  fevers, chills, night sweats, or weight loss.  EYES: No visual changes or eye pain ENT: No hearing changes.  No history of nose bleeds.   RESPIRATORY: No cough, wheezing and shortness of breath.   CARDIOVASCULAR: Negative for chest pain, and palpitations.   GI: Negative for abdominal discomfort, blood in stools or black stools.  No recent change in bowel habits.   GU:  No history of incontinence.   MUSCLOSKELETAL: +history of joint pain or swelling.  No myalgias.   SKIN: Negative for lesions, rash, and itching.   ENDOCRINE: Negative for cold or heat intolerance, polydipsia or goiter.   PSYCH:  No depression or anxiety symptoms.   NEURO: As Above.   Vital Signs:  BP (!) 126/50   Pulse 70   Ht 5\' 10"  (1.778 m)   Wt 202 lb (91.6 kg)   SpO2 97%   BMI 28.98 kg/m   Neurological Exam: MENTAL STATUS including orientation to time, place, person, recent and remote memory, attention span and concentration, language, and fund of knowledge is fair.  Formal MOCA was unable to be performed due to visual deficits.  Sitting in  transport wheelchair.   CRANIAL NERVES:  Pupils equal round and reactive to light.  Normal conjugate, extra-ocular eye movements in all directions of gaze.  No ptosis. Face is symmetric. Palate elevates symmetrically.  Tongue is midline.  MOTOR:  Motor strength is 5/5 in all extremities, except 5-/5 in hip flexors.  No atrophy, fasciculations or myoclonic movements. He has mild tremor at rest and with movement.  When completely relaxed, there is no rigidity in the arms.    COORDINATION/GAIT: Gait not tested as patient is in wheelchair  Data: CT head 01/12/2016: No acute intracranial hemorrhage. Age-related atrophy and chronic microvascular ischemic disease.   IMPRESSION/PLAN: 1.  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  Clinically, well controlled on clonazepam 0.25mg  BID.  Offered to taper the dose, but patient would like to continue this because it also controls his tremors.  Discussed adding sinemet as there are some parkinsonian features, but they do not wish to make any medication changes.  2.  Dementia without behavior disturbance.  Aricept declined.   3.  Multifactorial gait instability due to deconditioning, neuropathy, lumbar degeneration.  Falls precautions were stressed at length because he takes coumadin for atrial fibrillation and history of PE/DVT.  He will be starting home physical therapy.  4.  Non-Hodgkins lymphoma undergoing chemotherapy, followed by Dr. Benay Spice  Return to [facility] in 6 months, or sooner as needed  The duration of this appointment visit was 25 minutes of face-to-face time with the patient.  Greater than 50% of this time was spent in counseling, explanation of diagnosis, planning of further management, and coordination of care.   Thank you for allowing me to participate in patient's care.  If I can answer any additional questions, I would be pleased to do so.    Sincerely,    Jakyren Fluegge K. Posey Pronto, DO

## 2016-06-26 NOTE — Patient Instructions (Signed)
1.  Continue clonazepam 0.25mg  twice daily 2.  Return to clinic in 6 months

## 2016-06-28 ENCOUNTER — Other Ambulatory Visit: Payer: Self-pay | Admitting: Oncology

## 2016-06-29 ENCOUNTER — Encounter: Payer: Self-pay | Admitting: Interventional Cardiology

## 2016-06-29 ENCOUNTER — Ambulatory Visit (INDEPENDENT_AMBULATORY_CARE_PROVIDER_SITE_OTHER): Payer: Medicare Other | Admitting: Interventional Cardiology

## 2016-06-29 VITALS — BP 150/60 | HR 65 | Ht 70.0 in | Wt 205.0 lb

## 2016-06-29 DIAGNOSIS — I10 Essential (primary) hypertension: Secondary | ICD-10-CM | POA: Diagnosis not present

## 2016-06-29 DIAGNOSIS — I480 Paroxysmal atrial fibrillation: Secondary | ICD-10-CM

## 2016-06-29 DIAGNOSIS — I739 Peripheral vascular disease, unspecified: Secondary | ICD-10-CM

## 2016-06-29 DIAGNOSIS — G4733 Obstructive sleep apnea (adult) (pediatric): Secondary | ICD-10-CM

## 2016-06-29 DIAGNOSIS — I25810 Atherosclerosis of coronary artery bypass graft(s) without angina pectoris: Secondary | ICD-10-CM | POA: Diagnosis not present

## 2016-06-29 DIAGNOSIS — I5032 Chronic diastolic (congestive) heart failure: Secondary | ICD-10-CM | POA: Diagnosis not present

## 2016-06-29 DIAGNOSIS — R0600 Dyspnea, unspecified: Secondary | ICD-10-CM

## 2016-06-29 DIAGNOSIS — I495 Sick sinus syndrome: Secondary | ICD-10-CM

## 2016-06-29 DIAGNOSIS — Z9989 Dependence on other enabling machines and devices: Secondary | ICD-10-CM

## 2016-06-29 LAB — BASIC METABOLIC PANEL
BUN: 25 mg/dL (ref 7–25)
CALCIUM: 9.2 mg/dL (ref 8.6–10.3)
CHLORIDE: 104 mmol/L (ref 98–110)
CO2: 29 mmol/L (ref 20–31)
Creat: 1.41 mg/dL — ABNORMAL HIGH (ref 0.70–1.11)
GLUCOSE: 97 mg/dL (ref 65–99)
Potassium: 3.6 mmol/L (ref 3.5–5.3)
SODIUM: 140 mmol/L (ref 135–146)

## 2016-06-29 NOTE — Progress Notes (Signed)
Cardiology Office Note    Date:  06/29/2016   ID:  SALAHUDDIN SALDUTTI, DOB 11-15-1926, MRN [id number]  PCP:  Mathews Argyle, MD  Cardiologist: Sinclair Grooms, MD   Chief Complaint  Patient presents with  . Congestive Heart Failure  . Coronary Artery Disease  . Atrial Fibrillation  . Shortness of Breath    Weight gain    History of Present Illness:  Edward Woodward is a 82 y.o. male who presents for Chronic systolic/diastolic heart failure, permanent pacemaker insertion, atrial fibrillation, hypertension, history of pulmonary emboli, chronic venous insufficiency with peripheral edema, History of DVT, lymphoma and currently receiving chemotherapy, and obstructive sleep apnea.  Overall Edward Woodward is doing better now than at the time of previous evaluation. Lower extremity edema has improved. In ago and completely away according to Mrs. Daugherty. Now that his appetite has improved, he is gaining weight and there is reappearance of some mild lower extremity swelling. He denies chest pain. No episodes of palpitation. He has not had syncope.   Past Medical History:  Diagnosis Date  . AMD (age-related macular degeneration), bilateral   . Anemia   . Anginal pain ([place])    pts wife states pt had to use nitroglycerin this am 04/09/2016  . CAD (coronary artery disease)     NYHA Class 1B, CABG 1985  . CHF (congestive heart failure) ([place])   . Collagen vascular disease ([place])   . Collapsed lung    right   . Complication of anesthesia    pts wife states pt gets confused   . Degeneration of lumbar or lumbosacral intervertebral disc   . Depressive disorder, not elsewhere classified   . Dysrhythmia   . GERD (gastroesophageal reflux disease)   . History of blood transfusion   . History of hiatal hernia   . HTN (hypertension)   . Hypercholesteremia   . Lower leg edema    bilat  . Multiple falls   . Myocardial infarction   . Neuromuscular disorder (HCC)    neuropathy  .  Neuropathy ([place])   . Non Hodgkin's lymphoma ([place])   . OSA on CPAP   . PE (pulmonary embolism) 03/2015  . Presence of permanent cardiac pacemaker   . Prostate cancer ([place])   . Shortness of breath dyspnea    with exertion   . Sinoatrial node dysfunction (HCC)   . Ulcers of both lower legs (HCC)    history of   . Urinary incontinence     Past Surgical History:  Procedure Laterality Date  . APPENDECTOMY    . BACK SURGERY    . CARDIAC CATHETERIZATION N/A 08/27/2015   Procedure: Left Heart Cath and Cors/Grafts Angiography;  Surgeon: Belva Crome, MD;  Location: [facility];  Service: Cardiovascular;  Laterality: N/A;  . COLONOSCOPY N/A 08/26/2015   Procedure: COLONOSCOPY;  Surgeon: Clarene Essex, MD;  Location: [facility] ENDOSCOPY;  Service: Endoscopy;  Laterality: N/A;  . CORONARY ANGIOPLASTY WITH STENT PLACEMENT     s/p bare metal stent implant SVG-diagonal 11/23/05, s/p BM stent implantation SVG diagonal 10/22/06 (feeds LAD), and 05/2010 with DES to left circumflex  . CORONARY ARTERY BYPASS GRAFT    . CYSTOSCOPY W/ URETERAL STENT PLACEMENT Bilateral 04/10/2016   Procedure: CYSTOSCOPY WITH RETROGRADE PYELOGRAM/URETERAL STENT PLACEMENT;  Surgeon: Festus Aloe, MD;  Location: [facility] ORS;  Service: Urology;  Laterality: Bilateral;  . ENDARTERECTOMY     bilat   . ESOPHAGOGASTRODUODENOSCOPY (EGD) WITH PROPOFOL N/A 08/26/2015  Procedure: ESOPHAGOGASTRODUODENOSCOPY (EGD) WITH PROPOFOL;  Surgeon: Clarene Essex, MD;  Location: [facility] ENDOSCOPY;  Service: Endoscopy;  Laterality: N/A;  . EXPLORATORY LAPAROTOMY    . LUMBAR FUSION    . PACEMAKER INSERTION    . ROTATOR CUFF REPAIR     bilat  . TONSILLECTOMY      Current Medications: Outpatient Medications Prior to Visit  Medication Sig Dispense Refill  . abiraterone Acetate (ZYTIGA) 250 MG tablet Take 4 tablets (1,000 mg total) by mouth daily. Take on an empty stomach 1 hour before or 2 hours after a meal 120 tablet 1  . acetaminophen (TYLENOL) 500 MG  tablet Take 1,000 mg by mouth every 6 (six) hours as needed for mild pain, moderate pain or headache.     Marland Kitchen atorvastatin (LIPITOR) 40 MG tablet Take 40 mg by mouth every other day.    . benzocaine (ORAJEL) 10 % mucosal gel Use as directed 1 application in the mouth or throat as needed for mouth pain.    . citalopram (CELEXA) 20 MG tablet Take 20 mg by mouth at bedtime.     . clonazePAM (KLONOPIN) 0.25 MG disintegrating tablet DISSOLVE ONE-HALF TABLET IN MOUTH TWICE DAILY 30 tablet 1  . fluticasone (FLONASE) 50 MCG/ACT nasal spray Place 1 spray into both nostrils daily as needed for rhinitis.    Marland Kitchen isosorbide mononitrate (IMDUR) 30 MG 24 hr tablet Take 30 mg by mouth at bedtime.    . Lutein-Zeaxanthin 25-5 MG CAPS Take 1 capsule by mouth daily.     . magnesium hydroxide (MILK OF MAGNESIA) 400 MG/5ML suspension Take 5 mLs by mouth daily as needed for mild constipation.     . Melatonin 3 MG TABS Take 3 mg by mouth at bedtime.    . metoprolol succinate (TOPROL-XL) 50 MG 24 hr tablet Take 25 mg by mouth at bedtime. Take with or immediately following a meal.    . nitroGLYCERIN (NITROLINGUAL) 0.4 MG/SPRAY spray Place 1 spray under the tongue every 5 (five) minutes x 3 doses as needed for chest pain. 12 g 3  . ondansetron (ZOFRAN) 4 MG tablet Take 1 tablet (4 mg total) by mouth every 8 (eight) hours as needed for nausea or vomiting. 20 tablet 0  . Polyethyl Glycol-Propyl Glycol (SYSTANE) 0.4-0.3 % SOLN Place 1 drop into both eyes at bedtime.     . predniSONE (DELTASONE) 5 MG tablet Take 5 mg by mouth daily with breakfast.    . torsemide (DEMADEX) 20 MG tablet Take 10 mg by mouth 2 (two) times daily.     . traMADol (ULTRAM) 50 MG tablet Take 0.5 tablets (25 mg total) by mouth every 6 (six) hours as needed for moderate pain. 30 tablet 0  . vitamin B-12 (CYANOCOBALAMIN) 1000 MCG tablet Take 1,000 mcg by mouth daily.    Marland Kitchen warfarin (COUMADIN) 5 MG tablet Take 2.5 mg by mouth at bedtime.     No  facility-administered medications prior to visit.      Allergies:   Penicillins and Simvastatin   Social History   Social History  . Marital status: Married    Spouse name: N/A  . Number of children: 3  . Years of education: N/A   Occupational History  . Retired Optometrist    Social History Main Topics  . Smoking status: Former Smoker    Packs/day: 0.50    Years: 5.00    Types: Cigarettes    Quit date: 07/28/1955  . Smokeless tobacco: Never Used  .  Alcohol use No  . Drug use: No  . Sexual activity: No   Other Topics Concern  . None   Social History Narrative   Lives with wife in a one story home.  Has 3 children and 2 stepchildren.  Retired Optometrist.  Education: college.     Family History:  The patient's family history includes Cancer in his mother; Heart disease in his brother; Heart disease (age of onset: 17) in his father.   ROS:   Please see the history of present illness.    Unexplained weight gain, improvement in appetite, decreased hearing, difficulty with vision disturbance, off, dark stools, blood in his urine, easy bruising, snoring, constipation, and difficulty with balance.  All other systems reviewed and are negative.   PHYSICAL EXAM:   VS:  BP (!) 150/60 (BP Location: Right Arm)   Pulse 65   Ht 5\' 10"  (1.778 m)   Wt 205 lb (93 kg)   BMI 29.41 kg/m    GEN: Well nourished, well developed, in no acute distress  HEENT: normal  Neck: no JVD, carotid bruits, or masses Cardiac: RRR; no murmurs, rubs, or gallops. There is mild lower extremity edema. Respiratory:  clear to auscultation bilaterally, normal work of breathing GI: soft, nontender, nondistended, + BS MS: no deformity or atrophy  Skin: warm and dry, no rash Neuro:  Alert and Oriented x 3, Strength and sensation are intact Psych: euthymic mood, full affect  Wt Readings from Last 3 Encounters:  06/29/16 205 lb (93 kg)  06/26/16 202 lb (91.6 kg)  06/24/16 202 lb (91.6 kg)       Studies/Labs Reviewed:   EKG:  EKG  Formed in November 2017 revealed normal sinus rhythm with nonspecific T-wave flattening. This was obtained in the emergency room when he presented with fusion and weakness.  Recent Labs: 03/06/2016: TSH 0.73 06/09/2016: B Natriuretic Peptide 133.5 06/11/2016: Magnesium 1.8 06/24/2016: ALT 12; BUN 21.7; Creatinine 1.4; HGB 10.4; Platelets 200; Potassium 3.9; Sodium 138   Lipid Panel    Component Value Date/Time   CHOL 139 08/24/2015 0343   TRIG 134 08/24/2015 0343   HDL 32 (L) 08/24/2015 0343   CHOLHDL 4.3 08/24/2015 0343   VLDL 27 08/24/2015 0343   LDLCALC 80 08/24/2015 0343    Additional studies/ records that were reviewed today include:   Chest x-ray from 06/09/16: IMPRESSION: No edema or consolidation. Small calcified granuloma right lower lobe. Stable cardiac silhouette. Aortic atherosclerosis.    ASSESSMENT:    1. Chronic diastolic heart failure (HCC)   2. Paroxysmal atrial fibrillation (HCC)   3. Atherosclerosis of coronary artery bypass graft of native heart without angina pectoris   4. Essential hypertension, benign   5. OSA on CPAP   6. Sinus node dysfunction chronotropic incompetence   7. Peripheral vascular disease ([place])   8. Dyspnea, unspecified type      PLAN:  In order of problems listed above:  1. There is lower extremity edema. There is no acute evidence of volume overload or decompensated heart failure. He is complaining of dyspnea. I will check a BNP to rule out the possibility of progressive heart failure. 2. He has not in chronic atrial fibrillation and in fact was in sinus rhythm when seen in the emergency room in November. 3. Asymptomatic with reference to angina. No anatomy amenable to PCI. He does have bypass graft failure 4. We'll check a BNP to help exclude that dyspnea is related to volume expansion/CHF.. 5. Blood pressure is  elevated and I would classify him as having poor control. We will see what  today's blood work shows. We made need to be more aggressive with diuretic therapy. 6. Continue CPAP. 7. Treated with pacemaker therapy. 8. Not addressed.    Medication Adjustments/Labs and Tests Ordered: Current medicines are reviewed at length with the patient today.  Concerns regarding medicines are outlined above.  Medication changes, Labs and Tests ordered today are listed in the Patient Instructions below. Patient Instructions  Medication Instructions:  None  Labwork: BMET, BNP today  Testing/Procedures: None  Follow-Up: Your physician wants you to follow-up in: 4-8 months with Dr. Tamala Julian. You will receive a reminder letter in the mail two months in advance. If you don't receive a letter, please call our office to schedule the follow-up appointment.   Any Other Special Instructions Will Be Listed Below (If Applicable).     If you need a refill on your cardiac medications before your next appointment, please call your pharmacy.      Signed, Sinclair Grooms, MD  06/29/2016 12:47 PM    [facility] [place], [place], [place]  [address] Phone: [phone]; Fax: [phone]

## 2016-06-29 NOTE — Patient Instructions (Signed)
Medication Instructions:  None  Labwork: BMET, BNP today  Testing/Procedures: None  Follow-Up: Your physician wants you to follow-up in: 4-8 months with Dr. Tamala Julian. You will receive a reminder letter in the mail two months in advance. If you don't receive a letter, please call our office to schedule the follow-up appointment.   Any Other Special Instructions Will Be Listed Below (If Applicable).     If you need a refill on your cardiac medications before your next appointment, please call your pharmacy.

## 2016-06-30 DIAGNOSIS — I2510 Atherosclerotic heart disease of native coronary artery without angina pectoris: Secondary | ICD-10-CM | POA: Diagnosis not present

## 2016-06-30 DIAGNOSIS — E43 Unspecified severe protein-calorie malnutrition: Secondary | ICD-10-CM | POA: Diagnosis not present

## 2016-06-30 DIAGNOSIS — C8598 Non-Hodgkin lymphoma, unspecified, lymph nodes of multiple sites: Secondary | ICD-10-CM | POA: Diagnosis not present

## 2016-06-30 DIAGNOSIS — E785 Hyperlipidemia, unspecified: Secondary | ICD-10-CM | POA: Diagnosis not present

## 2016-06-30 DIAGNOSIS — I110 Hypertensive heart disease with heart failure: Secondary | ICD-10-CM | POA: Diagnosis not present

## 2016-06-30 DIAGNOSIS — I509 Heart failure, unspecified: Secondary | ICD-10-CM | POA: Diagnosis not present

## 2016-06-30 LAB — BRAIN NATRIURETIC PEPTIDE: Brain Natriuretic Peptide: 234.2 pg/mL — ABNORMAL HIGH (ref <100)

## 2016-07-02 ENCOUNTER — Other Ambulatory Visit (HOSPITAL_BASED_OUTPATIENT_CLINIC_OR_DEPARTMENT_OTHER): Payer: Medicare Other

## 2016-07-02 ENCOUNTER — Ambulatory Visit (HOSPITAL_BASED_OUTPATIENT_CLINIC_OR_DEPARTMENT_OTHER): Payer: Medicare Other | Admitting: Nurse Practitioner

## 2016-07-02 ENCOUNTER — Ambulatory Visit (HOSPITAL_BASED_OUTPATIENT_CLINIC_OR_DEPARTMENT_OTHER): Payer: Medicare Other

## 2016-07-02 ENCOUNTER — Encounter: Payer: Self-pay | Admitting: Nurse Practitioner

## 2016-07-02 VITALS — BP 127/50 | HR 65 | Temp 97.7°F | Resp 18 | Ht 70.0 in | Wt 203.1 lb

## 2016-07-02 VITALS — BP 136/67 | HR 65 | Temp 97.9°F | Resp 16

## 2016-07-02 DIAGNOSIS — Z5112 Encounter for antineoplastic immunotherapy: Secondary | ICD-10-CM | POA: Diagnosis present

## 2016-07-02 DIAGNOSIS — Z7901 Long term (current) use of anticoagulants: Secondary | ICD-10-CM | POA: Diagnosis not present

## 2016-07-02 DIAGNOSIS — C8223 Follicular lymphoma grade III, unspecified, intra-abdominal lymph nodes: Secondary | ICD-10-CM

## 2016-07-02 DIAGNOSIS — C8299 Follicular lymphoma, unspecified, extranodal and solid organ sites: Secondary | ICD-10-CM

## 2016-07-02 DIAGNOSIS — C61 Malignant neoplasm of prostate: Secondary | ICD-10-CM | POA: Diagnosis not present

## 2016-07-02 DIAGNOSIS — C8513 Unspecified B-cell lymphoma, intra-abdominal lymph nodes: Secondary | ICD-10-CM

## 2016-07-02 LAB — COMPREHENSIVE METABOLIC PANEL
ALBUMIN: 2.9 g/dL — AB (ref 3.5–5.0)
ALK PHOS: 67 U/L (ref 40–150)
ALT: 9 U/L (ref 0–55)
AST: 14 U/L (ref 5–34)
Anion Gap: 10 mEq/L (ref 3–11)
BUN: 21.4 mg/dL (ref 7.0–26.0)
CO2: 24 meq/L (ref 22–29)
Calcium: 9.4 mg/dL (ref 8.4–10.4)
Chloride: 107 mEq/L (ref 98–109)
Creatinine: 1.4 mg/dL — ABNORMAL HIGH (ref 0.7–1.3)
EGFR: 45 mL/min/{1.73_m2} — AB (ref >90)
GLUCOSE: 119 mg/dL (ref 70–140)
POTASSIUM: 4.3 meq/L (ref 3.5–5.1)
SODIUM: 142 meq/L (ref 136–145)
Total Bilirubin: 0.48 mg/dL (ref 0.20–1.20)
Total Protein: 5.4 g/dL — ABNORMAL LOW (ref 6.4–8.3)

## 2016-07-02 LAB — CBC WITH DIFFERENTIAL/PLATELET
BASO%: 0.7 % (ref 0.0–2.0)
BASOS ABS: 0.1 10*3/uL (ref 0.0–0.1)
EOS ABS: 0.2 10*3/uL (ref 0.0–0.5)
EOS%: 3.3 % (ref 0.0–7.0)
HCT: 29.8 % — ABNORMAL LOW (ref 38.4–49.9)
HGB: 9.8 g/dL — ABNORMAL LOW (ref 13.0–17.1)
LYMPH%: 25.8 % (ref 14.0–49.0)
MCH: 29.3 pg (ref 27.2–33.4)
MCHC: 32.8 g/dL (ref 32.0–36.0)
MCV: 89.4 fL (ref 79.3–98.0)
MONO#: 0.5 10*3/uL (ref 0.1–0.9)
MONO%: 7.1 % (ref 0.0–14.0)
NEUT#: 4.8 10*3/uL (ref 1.5–6.5)
NEUT%: 63.1 % (ref 39.0–75.0)
Platelets: 152 10*3/uL (ref 140–400)
RBC: 3.33 10*6/uL — AB (ref 4.20–5.82)
RDW: 16.3 % — ABNORMAL HIGH (ref 11.0–14.6)
WBC: 7.6 10*3/uL (ref 4.0–10.3)
lymph#: 2 10*3/uL (ref 0.9–3.3)

## 2016-07-02 LAB — PROTIME-INR
INR: 1.2 — AB (ref 2.00–3.50)
PROTIME: 14.4 s — AB (ref 10.6–13.4)

## 2016-07-02 MED ORDER — ACETAMINOPHEN 325 MG PO TABS
650.0000 mg | ORAL_TABLET | Freq: Once | ORAL | Status: AC
  Administered 2016-07-02: 650 mg via ORAL

## 2016-07-02 MED ORDER — DIPHENHYDRAMINE HCL 25 MG PO CAPS
ORAL_CAPSULE | ORAL | Status: AC
  Filled 2016-07-02: qty 2

## 2016-07-02 MED ORDER — SODIUM CHLORIDE 0.9 % IV SOLN
Freq: Once | INTRAVENOUS | Status: AC
  Administered 2016-07-02: 12:00:00 via INTRAVENOUS

## 2016-07-02 MED ORDER — DIPHENHYDRAMINE HCL 25 MG PO CAPS
50.0000 mg | ORAL_CAPSULE | Freq: Once | ORAL | Status: AC
  Administered 2016-07-02: 50 mg via ORAL

## 2016-07-02 MED ORDER — ACETAMINOPHEN 325 MG PO TABS
ORAL_TABLET | ORAL | Status: AC
  Filled 2016-07-02: qty 2

## 2016-07-02 MED ORDER — SODIUM CHLORIDE 0.9 % IV SOLN
375.0000 mg/m2 | Freq: Once | INTRAVENOUS | Status: AC
  Administered 2016-07-02: 800 mg via INTRAVENOUS
  Filled 2016-07-02: qty 50

## 2016-07-02 NOTE — Progress Notes (Addendum)
[place] OFFICE PROGRESS NOTE   Diagnosis:  Non-Hodgkin's lymphoma, prostate cancer  INTERVAL HISTORY:   Edward Woodward returns as scheduled. He completed cycle 2 weekly Rituxan 06/24/2016. He reports no signs of a reaction. Her his wife his overall condition continues to improve. He is able to walk to the bathroom independently with a walker. Appetite is better. Continued improvement in the confusion.  Objective:  Vital signs in last 24 hours:  Blood pressure (!) 127/50, pulse 65, temperature 97.7 F (36.5 C), temperature source Oral, resp. rate 18, height 5\' 10"  (1.778 m), weight 203 lb 1.6 oz (92.1 kg), SpO2 99 %.    HEENT: No thrush or ulcers. Lymphatics: No definite left low neck/scalene adenopathy. Resp: Lungs clear bilaterally. Cardio: Regular rate and rhythm. GI: Abdomen soft and nontender. Vascular: Trace pitting edema at the lower legs bilaterally. Neuro: Alert and oriented. Follows commands.    Lab Results:  Lab Results  Component Value Date   WBC 7.6 07/02/2016   HGB 9.8 (L) 07/02/2016   HCT 29.8 (L) 07/02/2016   MCV 89.4 07/02/2016   PLT 152 07/02/2016   NEUTROABS 4.8 07/02/2016    Imaging:  No results found.  Medications: I have reviewed the patient's current medications.  Assessment/Plan: 1. Non-Hodgkin's lymphoma (follicular grade 3 lymphoma) - status post 6 cycles of Cytoxan/prednisone/rituximab 07/01/2007 through 10/21/2007.   Biopsy of a retroperitoneal mass 05/13/2016 consistent with involvement by low-grade non-Hodgkin's lymphoma  Initiation of weekly Rituxan 06/17/2016 2. CT chest 04/01/2015 with no lymphadenopathy  CT abdomen/pelvis 05/01/2015 with mild progression of abdomen/pelvic lymphadenopathy 3. Prostate cancer - he has been treated with hormonal therapy, the PSA was stable on 10/15/2014  PSA increased 04/26/2015   Bone scan 05/09/2015 with unchanged increased activity in the thoracic and lumbar spine felt to most  likely be degenerative.   Initiation of Abiraterone and prednisone 05/11/2015. Normalization of the PSA on Abiraterone. 4. History of a normocytic anemia secondary to non-Hodgkin's lymphoma, chemotherapy, and renal insufficiency - progressive secondary to GI bleeding, stable 5. History of Mild thrombocytopenia  6. History of bilateral hydronephrosis. Renal ultrasound 09/26/2014 consistent with medical renal disease. No hydronephrosis.  7. Coronary artery disease - status post coronary artery stent placement. 8. History of noncalcified lung nodules. 9. History of severe neutropenia July 2009 - likely related to delayed toxicity from rituximab. 10. Macular degeneration. 11. Right middle lobe density on a CT of the chest 06/18/2010 measuring 2.5 x 1.6 cm with mildly increased metabolic activity (SUV max 2.5) on a PET scan 06/27/2010. The area of increased density was less confluent and appeared smaller on a restaging CT 09/08/2010. Right middle lobe "scarring" with no new or suspicious pulmonary nodule on a CT 08/04/2011. 12. Mild increased metabolic activity associated with several lymph nodes on the PET scan 06/27/2010 - likely related to lymphoma. No lymphadenopathy in the mediastinum or axillary areas on the CT 08/04/2011. 13. Right hip discomfort-he received a steroid injection by his primary physician without improvement. He saw Dr. Collier Salina and there was a question of a lytic process in the right pelvis. A bone scan on 09/21/2012 revealed no evidence of metastatic disease. 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 Renal insufficiency-progressive CT 01/12/2016 confirmed progressive hydronephrosis  Placement of bilateral ureter stents 04/10/2016 15. Left leg DVT and pulmonary embolism 04/01/2015-on Coumadin 16. Admission 08/23/2015 with severe anemia and GI bleeding  Upper and lower endoscopy 08/26/2015 without a clear source for bleeding identified 17. Leg  edema/anasarca-improved with leg wrapping and diuretics 18. Anemia secondary to chronic disease, renal failure, and possibly lymphoma 19. 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   Biopsy retroperitoneal soft tissue 05/13/2016 20. History of elevated calcium-most likely secondary to non-Hodgkin's lymphoma status post pamidronate 06/08/2016, calcitonin 06/11/2016   Disposition: Edward Woodward appears stable. He has completed 2 cycles of weekly Rituxan. Plan to proceed with week 3 today as scheduled. He will return for a follow-up visit and week 4 on 07/09/2016.  The PT/INR is subtherapeutic. Current Coumadin dose is 2.5 mg. He will increase Coumadin to 5 mg on Mondays and Thursdays, 2.5 mg all other days.  Patient seen with Dr. Benay Spice.  [name] ANP/GNP-BC   07/02/2016  10:20 AM  This was a shared visit with [name]. Edward Woodward is tolerating the rituximab well. His performance status has improved. The hypercalcemia has not recurred. He will complete week 3 of rituximab today. We adjusted the Coumadin dose today.  [name], M.D.

## 2016-07-02 NOTE — Patient Instructions (Signed)
[facility] Discharge Instructions for Patients Receiving Chemotherapy  Today you received the following chemotherapy agents: Rituxan   To help prevent nausea and vomiting after your treatment, we encourage you to take your nausea medication as directed.    If you develop nausea and vomiting that is not controlled by your nausea medication, call the clinic.   BELOW ARE SYMPTOMS THAT SHOULD BE REPORTED IMMEDIATELY:  *FEVER GREATER THAN 100.5 F  *CHILLS WITH OR WITHOUT FEVER  NAUSEA AND VOMITING THAT IS NOT CONTROLLED WITH YOUR NAUSEA MEDICATION  *UNUSUAL SHORTNESS OF BREATH  *UNUSUAL BRUISING OR BLEEDING  TENDERNESS IN MOUTH AND THROAT WITH OR WITHOUT PRESENCE OF ULCERS  *URINARY PROBLEMS  *BOWEL PROBLEMS  UNUSUAL RASH Items with * indicate a potential emergency and should be followed up as soon as possible.  Feel free to call the clinic you have any questions or concerns. The clinic phone number is [phone].  Please show the [facility] at check-in to the Emergency Department and triage nurse.     Rituximab injection What is this medicine? RITUXIMAB (ri TUX i mab) is a monoclonal antibody. It is used to treat non-Hodgkin lymphoma and chronic lymphocytic leukemia. It is also used to treat rheumatoid arthritis (RA). In RA, this medicine slows the inflammatory process and help reduce joint pain and swelling. This medicine is often used with other cancer or arthritis medications. This medicine may be used for other purposes; ask your health care provider or pharmacist if you have questions. COMMON BRAND NAME(S): Rituxan What should I tell my health care provider before I take this medicine? They need to know if you have any of these conditions: -heart disease -infection (especially a virus infection such as hepatitis B, chickenpox, cold sores, or herpes) -immune system problems -irregular heartbeat -kidney disease -lung or breathing disease, like  asthma -recently received or scheduled to receive a vaccine -an unusual or allergic reaction to rituximab, mouse proteins, other medicines, foods, dyes, or preservatives -pregnant or trying to get pregnant -breast-feeding How should I use this medicine? This medicine is for infusion into a vein. It is administered in a hospital or clinic by a specially trained health care professional. A special MedGuide will be given to you by the pharmacist with each prescription and refill. Be sure to read this information carefully each time. Talk to your pediatrician regarding the use of this medicine in children. This medicine is not approved for use in children. Overdosage: If you think you have taken too much of this medicine contact a poison control center or emergency room at once. NOTE: This medicine is only for you. Do not share this medicine with others. What if I miss a dose? It is important not to miss a dose. Call your doctor or health care professional if you are unable to keep an appointment. What may interact with this medicine? -cisplatin -medicines for blood pressure -some other medicines for arthritis -vaccines This list may not describe all possible interactions. Give your health care provider a list of all the medicines, herbs, non-prescription drugs, or dietary supplements you use. Also tell them if you smoke, drink alcohol, or use illegal drugs. Some items may interact with your medicine. What should I watch for while using this medicine? Report any side effects that you notice during your treatment right away, such as changes in your breathing, fever, chills, dizziness or lightheadedness. These effects are more common with the first dose. Visit your prescriber or health care professional  for checks on your progress. You will need to have regular blood work. Report any other side effects. The side effects of this medicine can continue after you finish your treatment. Continue your  course of treatment even though you feel ill unless your doctor tells you to stop. Call your doctor or health care professional for advice if you get a fever, chills or sore throat, or other symptoms of a cold or flu. Do not treat yourself. This drug decreases your body's ability to fight infections. Try to avoid being around people who are sick. This medicine may increase your risk to bruise or bleed. Call your doctor or health care professional if you notice any unusual bleeding. Be careful brushing and flossing your teeth or using a toothpick because you may get an infection or bleed more easily. If you have any dental work done, tell your dentist you are receiving this medicine. Avoid taking products that contain aspirin, acetaminophen, ibuprofen, naproxen, or ketoprofen unless instructed by your doctor. These medicines may hide a fever. Do not become pregnant while taking this medicine. Women should inform their doctor if they wish to become pregnant or think they might be pregnant. There is a potential for serious side effects to an unborn child. Talk to your health care professional or pharmacist for more information. Do not breast-feed an infant while taking this medicine. What side effects may I notice from receiving this medicine? Side effects that you should report to your doctor or health care professional as soon as possible: -allergic reactions like skin rash, itching or hives, swelling of the face, lips, or tongue -low blood counts - this medicine may decrease the number of white blood cells, red blood cells and platelets. You may be at increased risk for infections and bleeding. -signs of infection - fever or chills, cough, sore throat, pain or difficulty passing urine -signs of decreased platelets or bleeding - bruising, pinpoint red spots on the skin, black, tarry stools, blood in the urine -signs of decreased red blood cells - unusually weak or tired, fainting spells,  lightheadedness -breathing problems -confused, not responsive -chest pain -fast, irregular heartbeat -feeling faint or lightheaded, falls -mouth sores -redness, blistering, peeling or loosening of the skin, including inside the mouth -stomach pain -swelling of the ankles, feet, or hands -trouble passing urine or change in the amount of urine Side effects that usually do not require medical attention (report to your doctor or health care professional if they continue or are bothersome): -anxiety -headache -loss of appetite -muscle aches -nausea -night sweats This list may not describe all possible side effects. Call your doctor for medical advice about side effects. You may report side effects to FDA at 1-800-FDA-1088. Where should I keep my medicine? This drug is given in a hospital or clinic and will not be stored at home. NOTE: This sheet is a summary. It may not cover all possible information. If you have questions about this medicine, talk to your doctor, pharmacist, or health care provider.  2017 Elsevier/Gold Standard (2016-01-23 17:23:26)

## 2016-07-03 ENCOUNTER — Telehealth: Payer: Self-pay | Admitting: Neurology

## 2016-07-03 ENCOUNTER — Other Ambulatory Visit: Payer: Self-pay | Admitting: *Deleted

## 2016-07-03 DIAGNOSIS — E43 Unspecified severe protein-calorie malnutrition: Secondary | ICD-10-CM | POA: Diagnosis not present

## 2016-07-03 DIAGNOSIS — Z23 Encounter for immunization: Secondary | ICD-10-CM | POA: Diagnosis not present

## 2016-07-03 DIAGNOSIS — L89152 Pressure ulcer of sacral region, stage 2: Secondary | ICD-10-CM | POA: Diagnosis not present

## 2016-07-03 DIAGNOSIS — I110 Hypertensive heart disease with heart failure: Secondary | ICD-10-CM | POA: Diagnosis not present

## 2016-07-03 DIAGNOSIS — I2510 Atherosclerotic heart disease of native coronary artery without angina pectoris: Secondary | ICD-10-CM | POA: Diagnosis not present

## 2016-07-03 DIAGNOSIS — I129 Hypertensive chronic kidney disease with stage 1 through stage 4 chronic kidney disease, or unspecified chronic kidney disease: Secondary | ICD-10-CM | POA: Diagnosis not present

## 2016-07-03 DIAGNOSIS — G4733 Obstructive sleep apnea (adult) (pediatric): Secondary | ICD-10-CM | POA: Diagnosis not present

## 2016-07-03 DIAGNOSIS — E785 Hyperlipidemia, unspecified: Secondary | ICD-10-CM | POA: Diagnosis not present

## 2016-07-03 DIAGNOSIS — C8598 Non-Hodgkin lymphoma, unspecified, lymph nodes of multiple sites: Secondary | ICD-10-CM | POA: Diagnosis not present

## 2016-07-03 DIAGNOSIS — I509 Heart failure, unspecified: Secondary | ICD-10-CM | POA: Diagnosis not present

## 2016-07-03 MED ORDER — CLONAZEPAM 0.25 MG PO TBDP: ORAL_TABLET | ORAL | 1 refills | Status: DC

## 2016-07-03 NOTE — Telephone Encounter (Signed)
Ok to send another Rx?

## 2016-07-03 NOTE — Telephone Encounter (Signed)
Rx sent 

## 2016-07-03 NOTE — Telephone Encounter (Signed)
Spoke with Barnetta Chapel and authorized refill.

## 2016-07-03 NOTE — Telephone Encounter (Signed)
[facility] pharmacy left a message regarding a prescription for Clonazepam for PT/Dawn

## 2016-07-03 NOTE — Telephone Encounter (Signed)
Patient wife called and states that she ha lost the pills for the Klonopin. She would like [place] to call in the rx to [facility] [address]  in [place] please call her at [phone] or [phone] she just picked them up last week

## 2016-07-03 NOTE — Telephone Encounter (Signed)
Yes, please send new prescription.  Donika K. Posey Pronto, DO

## 2016-07-05 ENCOUNTER — Other Ambulatory Visit: Payer: Self-pay | Admitting: Oncology

## 2016-07-06 DIAGNOSIS — I2510 Atherosclerotic heart disease of native coronary artery without angina pectoris: Secondary | ICD-10-CM | POA: Diagnosis not present

## 2016-07-06 DIAGNOSIS — C8598 Non-Hodgkin lymphoma, unspecified, lymph nodes of multiple sites: Secondary | ICD-10-CM | POA: Diagnosis not present

## 2016-07-06 DIAGNOSIS — E785 Hyperlipidemia, unspecified: Secondary | ICD-10-CM | POA: Diagnosis not present

## 2016-07-06 DIAGNOSIS — I110 Hypertensive heart disease with heart failure: Secondary | ICD-10-CM | POA: Diagnosis not present

## 2016-07-06 DIAGNOSIS — I509 Heart failure, unspecified: Secondary | ICD-10-CM | POA: Diagnosis not present

## 2016-07-06 DIAGNOSIS — E43 Unspecified severe protein-calorie malnutrition: Secondary | ICD-10-CM | POA: Diagnosis not present

## 2016-07-08 DIAGNOSIS — I110 Hypertensive heart disease with heart failure: Secondary | ICD-10-CM | POA: Diagnosis not present

## 2016-07-08 DIAGNOSIS — I509 Heart failure, unspecified: Secondary | ICD-10-CM | POA: Diagnosis not present

## 2016-07-08 DIAGNOSIS — C8598 Non-Hodgkin lymphoma, unspecified, lymph nodes of multiple sites: Secondary | ICD-10-CM | POA: Diagnosis not present

## 2016-07-08 DIAGNOSIS — E785 Hyperlipidemia, unspecified: Secondary | ICD-10-CM | POA: Diagnosis not present

## 2016-07-08 DIAGNOSIS — E43 Unspecified severe protein-calorie malnutrition: Secondary | ICD-10-CM | POA: Diagnosis not present

## 2016-07-08 DIAGNOSIS — I2510 Atherosclerotic heart disease of native coronary artery without angina pectoris: Secondary | ICD-10-CM | POA: Diagnosis not present

## 2016-07-09 ENCOUNTER — Other Ambulatory Visit (HOSPITAL_BASED_OUTPATIENT_CLINIC_OR_DEPARTMENT_OTHER): Payer: Medicare Other

## 2016-07-09 ENCOUNTER — Ambulatory Visit (HOSPITAL_BASED_OUTPATIENT_CLINIC_OR_DEPARTMENT_OTHER): Payer: Medicare Other

## 2016-07-09 ENCOUNTER — Telehealth: Payer: Self-pay | Admitting: Nurse Practitioner

## 2016-07-09 ENCOUNTER — Ambulatory Visit (HOSPITAL_BASED_OUTPATIENT_CLINIC_OR_DEPARTMENT_OTHER): Payer: Medicare Other | Admitting: Nurse Practitioner

## 2016-07-09 VITALS — BP 126/59 | HR 65 | Temp 98.2°F | Resp 17

## 2016-07-09 VITALS — BP 155/59 | HR 72 | Temp 97.6°F | Resp 18 | Wt 200.1 lb

## 2016-07-09 DIAGNOSIS — Z7901 Long term (current) use of anticoagulants: Secondary | ICD-10-CM | POA: Diagnosis not present

## 2016-07-09 DIAGNOSIS — I4891 Unspecified atrial fibrillation: Secondary | ICD-10-CM

## 2016-07-09 DIAGNOSIS — C8223 Follicular lymphoma grade III, unspecified, intra-abdominal lymph nodes: Secondary | ICD-10-CM

## 2016-07-09 DIAGNOSIS — Z86718 Personal history of other venous thrombosis and embolism: Secondary | ICD-10-CM | POA: Diagnosis not present

## 2016-07-09 DIAGNOSIS — C61 Malignant neoplasm of prostate: Secondary | ICD-10-CM

## 2016-07-09 DIAGNOSIS — D630 Anemia in neoplastic disease: Secondary | ICD-10-CM | POA: Diagnosis not present

## 2016-07-09 DIAGNOSIS — Z5112 Encounter for antineoplastic immunotherapy: Secondary | ICD-10-CM

## 2016-07-09 DIAGNOSIS — C8299 Follicular lymphoma, unspecified, extranodal and solid organ sites: Secondary | ICD-10-CM

## 2016-07-09 LAB — CBC WITH DIFFERENTIAL/PLATELET
BASO%: 0.3 % (ref 0.0–2.0)
Basophils Absolute: 0 10*3/uL (ref 0.0–0.1)
EOS ABS: 0.3 10*3/uL (ref 0.0–0.5)
EOS%: 3.8 % (ref 0.0–7.0)
HEMATOCRIT: 31.5 % — AB (ref 38.4–49.9)
HGB: 10.1 g/dL — ABNORMAL LOW (ref 13.0–17.1)
LYMPH#: 2.3 10*3/uL (ref 0.9–3.3)
LYMPH%: 28.9 % (ref 14.0–49.0)
MCH: 29.4 pg (ref 27.2–33.4)
MCHC: 32.1 g/dL (ref 32.0–36.0)
MCV: 91.6 fL (ref 79.3–98.0)
MONO#: 0.6 10*3/uL (ref 0.1–0.9)
MONO%: 7.1 % (ref 0.0–14.0)
NEUT#: 4.8 10*3/uL (ref 1.5–6.5)
NEUT%: 59.9 % (ref 39.0–75.0)
PLATELETS: 161 10*3/uL (ref 140–400)
RBC: 3.44 10*6/uL — AB (ref 4.20–5.82)
RDW: 15 % — ABNORMAL HIGH (ref 11.0–14.6)
WBC: 7.9 10*3/uL (ref 4.0–10.3)

## 2016-07-09 LAB — COMPREHENSIVE METABOLIC PANEL
ALT: 9 U/L (ref 0–55)
ANION GAP: 10 meq/L (ref 3–11)
AST: 13 U/L (ref 5–34)
Albumin: 3.1 g/dL — ABNORMAL LOW (ref 3.5–5.0)
Alkaline Phosphatase: 63 U/L (ref 40–150)
BILIRUBIN TOTAL: 0.47 mg/dL (ref 0.20–1.20)
BUN: 28 mg/dL — ABNORMAL HIGH (ref 7.0–26.0)
CALCIUM: 9.9 mg/dL (ref 8.4–10.4)
CHLORIDE: 105 meq/L (ref 98–109)
CO2: 26 meq/L (ref 22–29)
CREATININE: 1.5 mg/dL — AB (ref 0.7–1.3)
EGFR: 41 mL/min/{1.73_m2} — AB (ref >90)
Glucose: 90 mg/dl (ref 70–140)
Potassium: 4 mEq/L (ref 3.5–5.1)
Sodium: 141 mEq/L (ref 136–145)
TOTAL PROTEIN: 5.7 g/dL — AB (ref 6.4–8.3)

## 2016-07-09 LAB — PROTIME-INR
INR: 1.4 — AB (ref 2.00–3.50)
PROTIME: 16.8 s — AB (ref 10.6–13.4)

## 2016-07-09 MED ORDER — DIPHENHYDRAMINE HCL 25 MG PO CAPS
ORAL_CAPSULE | ORAL | Status: AC
  Filled 2016-07-09: qty 2

## 2016-07-09 MED ORDER — SODIUM CHLORIDE 0.9 % IV SOLN
375.0000 mg/m2 | Freq: Once | INTRAVENOUS | Status: AC
  Administered 2016-07-09: 800 mg via INTRAVENOUS
  Filled 2016-07-09: qty 50

## 2016-07-09 MED ORDER — ACETAMINOPHEN 325 MG PO TABS
ORAL_TABLET | ORAL | Status: AC
  Filled 2016-07-09: qty 2

## 2016-07-09 MED ORDER — ACETAMINOPHEN 325 MG PO TABS
650.0000 mg | ORAL_TABLET | Freq: Once | ORAL | Status: AC
  Administered 2016-07-09: 650 mg via ORAL

## 2016-07-09 MED ORDER — SODIUM CHLORIDE 0.9 % IV SOLN
Freq: Once | INTRAVENOUS | Status: AC
  Administered 2016-07-09: 13:00:00 via INTRAVENOUS

## 2016-07-09 MED ORDER — DIPHENHYDRAMINE HCL 25 MG PO CAPS
50.0000 mg | ORAL_CAPSULE | Freq: Once | ORAL | Status: AC
  Administered 2016-07-09: 50 mg via ORAL

## 2016-07-09 NOTE — Progress Notes (Signed)
[facility] OFFICE PROGRESS NOTE   Diagnosis:  Non-Hodgkin's lymphoma, prostate cancer  INTERVAL HISTORY:   Edward Woodward returns as scheduled. He completed cycle 3 weekly Rituxan 07/02/2016. He reports tolerating the Rituxan well with no signs of a reaction. Overall he continues to improve. He was able to come to the office today with a walker rather than a wheelchair. Appetite is improving. Confusion also continues to be improved. No fevers or sweats.  Objective:  Vital signs in last 24 hours:  Blood pressure (!) 155/59, pulse 72, temperature 97.6 F (36.4 C), temperature source Oral, resp. rate 18, weight 200 lb 1.6 oz (90.8 kg), SpO2 98 %.    HEENT: No thrush or ulcers. Lymphatics: No definite left low neck/scalene adenopathy. Resp: Lungs clear bilaterally. Cardio: Regular rate and rhythm. GI: Abdomen soft and nontender. No organomegaly. Vascular: Trace pitting edema at the lower legs bilaterally. Neuro: Alert and oriented.    Lab Results:  Lab Results  Component Value Date   WBC 7.9 07/09/2016   HGB 10.1 (L) 07/09/2016   HCT 31.5 (L) 07/09/2016   MCV 91.6 07/09/2016   PLT 161 07/09/2016   NEUTROABS 4.8 07/09/2016    Imaging:  No results found.  Medications: I have reviewed the patient's current medications.  Assessment/Plan: 1. Non-Hodgkin's lymphoma (follicular grade 3 lymphoma) - status post 6 cycles of Cytoxan/prednisone/rituximab 07/01/2007 through 10/21/2007.   Biopsy of a retroperitoneal mass 05/13/2016 consistent with involvement by low-grade non-Hodgkin's lymphoma  Initiation of weekly Rituxan 06/17/2016; week 4 given 07/09/2016. 2. CT chest 04/01/2015 with no lymphadenopathy  CT abdomen/pelvis 05/01/2015 with mild progression of abdomen/pelvic lymphadenopathy 3. Prostate cancer - he has been treated with hormonal therapy, the PSA was stable on 10/15/2014  PSA increased 04/26/2015   Bone scan 05/09/2015 with unchanged increased  activity in the thoracic and lumbar spine felt to most likely be degenerative.   Initiation of Abiraterone and prednisone 05/11/2015. Normalization of the PSA on Abiraterone. 4. History of a normocytic anemia secondary to non-Hodgkin's lymphoma, chemotherapy, and renal insufficiency - progressive secondary to GI bleeding, stable 5. History of Mild thrombocytopenia  6. History of bilateral hydronephrosis. Renal ultrasound 09/26/2014 consistent with medical renal disease. No hydronephrosis.  7. Coronary artery disease - status post coronary artery stent placement. 8. History of noncalcified lung nodules. 9. History of severe neutropenia July 2009 - likely related to delayed toxicity from rituximab. 10. Macular degeneration. 11. Right middle lobe density on a CT of the chest 06/18/2010 measuring 2.5 x 1.6 cm with mildly increased metabolic activity (SUV max 2.5) on a PET scan 06/27/2010. The area of increased density was less confluent and appeared smaller on a restaging CT 09/08/2010. Right middle lobe "scarring" with no new or suspicious pulmonary nodule on a CT 08/04/2011. 12. Mild increased metabolic activity associated with several lymph nodes on the PET scan 06/27/2010 - likely related to lymphoma. No lymphadenopathy in the mediastinum or axillary areas on the CT 08/04/2011. 13. Right hip discomfort-he received a steroid injection by his primary physician without improvement. He saw Dr. Collier Salina and there was a question of a lytic process in the right pelvis. A bone scan on 09/21/2012 revealed no evidence of metastatic disease. 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 Renal insufficiency-progressive CT 01/12/2016 confirmed progressive hydronephrosis  Placement of bilateral ureter stents 04/10/2016 15. Left leg DVT and pulmonary embolism 04/01/2015-on Coumadin 16. Admission 08/23/2015 with severe anemia and GI bleeding  Upper and lower endoscopy 08/26/2015  without a clear source for bleeding identified 17. Leg edema/anasarca-improved with leg wrapping and diuretics 18. Anemia secondary to chronic disease, renal failure, and possibly lymphoma 19. 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   Biopsy retroperitoneal soft tissue 05/13/2016 20. History of elevated calcium-most likely secondary to non-Hodgkin's lymphoma status post pamidronate 06/08/2016, calcitonin 06/11/2016   Disposition: Edward Woodward appears stable. His performance status continues to improve. Plan to proceed with the fourth and final weekly Rituxan today as scheduled. We reviewed the plan for maintenance Rituxan to begin in approximately 3 months.  The PT/INR remains subtherapeutic. Current Coumadin dose is 5 mg on Mondays and Thursdays, 2.5 mg all other days. He will increase Coumadin to 5 mg all days except 2.5 mg on Monday, Wednesday, Friday. He will return for repeat labs on 07/23/2016.  He will return for a follow-up visit in approximately one month. He will contact the office in the interim with any problems.  Plan reviewed with Dr. Benay Spice.    [name] ANP/GNP-BC   07/09/2016  12:44 PM

## 2016-07-09 NOTE — Patient Instructions (Addendum)
[facility] Discharge Instructions for Patients Receiving Chemotherapy  Today you received the following chemotherapy agents: Rituxan   To help prevent nausea and vomiting after your treatment, we encourage you to take your nausea medication as directed.    If you develop nausea and vomiting that is not controlled by your nausea medication, call the clinic.   BELOW ARE SYMPTOMS THAT SHOULD BE REPORTED IMMEDIATELY:  *FEVER GREATER THAN 100.5 F  *CHILLS WITH OR WITHOUT FEVER  NAUSEA AND VOMITING THAT IS NOT CONTROLLED WITH YOUR NAUSEA MEDICATION  *UNUSUAL SHORTNESS OF BREATH  *UNUSUAL BRUISING OR BLEEDING  TENDERNESS IN MOUTH AND THROAT WITH OR WITHOUT PRESENCE OF ULCERS  *URINARY PROBLEMS  *BOWEL PROBLEMS  UNUSUAL RASH Items with * indicate a potential emergency and should be followed up as soon as possible.  Feel free to call the clinic you have any questions or concerns. The clinic phone number is [phone].  Please show the CHEMO ALERT CARD at check-in to the Emergency Department and triage nurse.   

## 2016-07-09 NOTE — Progress Notes (Signed)
[name], NP okay to tx pt today with Ctn 1.5.

## 2016-07-09 NOTE — Telephone Encounter (Signed)
Appointments scheduled per 12/14 LOS. Patient given AVS report and calendars with future scheduled appointments.  °

## 2016-07-10 DIAGNOSIS — C8598 Non-Hodgkin lymphoma, unspecified, lymph nodes of multiple sites: Secondary | ICD-10-CM | POA: Diagnosis not present

## 2016-07-10 DIAGNOSIS — E43 Unspecified severe protein-calorie malnutrition: Secondary | ICD-10-CM | POA: Diagnosis not present

## 2016-07-10 DIAGNOSIS — E785 Hyperlipidemia, unspecified: Secondary | ICD-10-CM | POA: Diagnosis not present

## 2016-07-10 DIAGNOSIS — I509 Heart failure, unspecified: Secondary | ICD-10-CM | POA: Diagnosis not present

## 2016-07-10 DIAGNOSIS — I2510 Atherosclerotic heart disease of native coronary artery without angina pectoris: Secondary | ICD-10-CM | POA: Diagnosis not present

## 2016-07-10 DIAGNOSIS — I110 Hypertensive heart disease with heart failure: Secondary | ICD-10-CM | POA: Diagnosis not present

## 2016-07-13 ENCOUNTER — Other Ambulatory Visit: Payer: Self-pay | Admitting: Oncology

## 2016-07-13 DIAGNOSIS — E43 Unspecified severe protein-calorie malnutrition: Secondary | ICD-10-CM | POA: Diagnosis not present

## 2016-07-13 DIAGNOSIS — C8598 Non-Hodgkin lymphoma, unspecified, lymph nodes of multiple sites: Secondary | ICD-10-CM | POA: Diagnosis not present

## 2016-07-13 DIAGNOSIS — I110 Hypertensive heart disease with heart failure: Secondary | ICD-10-CM | POA: Diagnosis not present

## 2016-07-13 DIAGNOSIS — E785 Hyperlipidemia, unspecified: Secondary | ICD-10-CM | POA: Diagnosis not present

## 2016-07-13 DIAGNOSIS — I2510 Atherosclerotic heart disease of native coronary artery without angina pectoris: Secondary | ICD-10-CM | POA: Diagnosis not present

## 2016-07-13 DIAGNOSIS — I509 Heart failure, unspecified: Secondary | ICD-10-CM | POA: Diagnosis not present

## 2016-07-14 ENCOUNTER — Ambulatory Visit (HOSPITAL_BASED_OUTPATIENT_CLINIC_OR_DEPARTMENT_OTHER): Payer: Medicare Other

## 2016-07-14 ENCOUNTER — Ambulatory Visit (HOSPITAL_BASED_OUTPATIENT_CLINIC_OR_DEPARTMENT_OTHER): Payer: Medicare Other | Admitting: Nurse Practitioner

## 2016-07-14 ENCOUNTER — Telehealth: Payer: Self-pay

## 2016-07-14 VITALS — BP 127/56 | HR 65 | Temp 97.6°F | Resp 16 | Ht 70.0 in

## 2016-07-14 DIAGNOSIS — C8299 Follicular lymphoma, unspecified, extranodal and solid organ sites: Secondary | ICD-10-CM

## 2016-07-14 DIAGNOSIS — Z7901 Long term (current) use of anticoagulants: Secondary | ICD-10-CM

## 2016-07-14 DIAGNOSIS — R319 Hematuria, unspecified: Secondary | ICD-10-CM

## 2016-07-14 DIAGNOSIS — C8223 Follicular lymphoma grade III, unspecified, intra-abdominal lymph nodes: Secondary | ICD-10-CM

## 2016-07-14 DIAGNOSIS — C8179 Other classical Hodgkin lymphoma, extranodal and solid organ sites: Secondary | ICD-10-CM | POA: Diagnosis not present

## 2016-07-14 DIAGNOSIS — Z86718 Personal history of other venous thrombosis and embolism: Secondary | ICD-10-CM

## 2016-07-14 DIAGNOSIS — R310 Gross hematuria: Secondary | ICD-10-CM

## 2016-07-14 DIAGNOSIS — C61 Malignant neoplasm of prostate: Secondary | ICD-10-CM

## 2016-07-14 LAB — COMPREHENSIVE METABOLIC PANEL
ALBUMIN: 3.4 g/dL — AB (ref 3.5–5.0)
ALK PHOS: 63 U/L (ref 40–150)
ALT: 10 U/L (ref 0–55)
AST: 17 U/L (ref 5–34)
Anion Gap: 9 mEq/L (ref 3–11)
BILIRUBIN TOTAL: 0.62 mg/dL (ref 0.20–1.20)
BUN: 26 mg/dL (ref 7.0–26.0)
CALCIUM: 10.1 mg/dL (ref 8.4–10.4)
CO2: 29 mEq/L (ref 22–29)
Chloride: 104 mEq/L (ref 98–109)
Creatinine: 1.6 mg/dL — ABNORMAL HIGH (ref 0.7–1.3)
EGFR: 37 mL/min/{1.73_m2} — ABNORMAL LOW (ref >90)
Glucose: 107 mg/dl (ref 70–140)
POTASSIUM: 4.1 meq/L (ref 3.5–5.1)
Sodium: 141 mEq/L (ref 136–145)
Total Protein: 6.1 g/dL — ABNORMAL LOW (ref 6.4–8.3)

## 2016-07-14 LAB — CBC WITH DIFFERENTIAL/PLATELET
BASO%: 0.9 % (ref 0.0–2.0)
BASOS ABS: 0.1 10*3/uL (ref 0.0–0.1)
EOS ABS: 0.3 10*3/uL (ref 0.0–0.5)
EOS%: 4 % (ref 0.0–7.0)
HEMATOCRIT: 34.1 % — AB (ref 38.4–49.9)
HEMOGLOBIN: 11.1 g/dL — AB (ref 13.0–17.1)
LYMPH#: 2.2 10*3/uL (ref 0.9–3.3)
LYMPH%: 29.8 % (ref 14.0–49.0)
MCH: 29 pg (ref 27.2–33.4)
MCHC: 32.5 g/dL (ref 32.0–36.0)
MCV: 89.1 fL (ref 79.3–98.0)
MONO#: 0.6 10*3/uL (ref 0.1–0.9)
MONO%: 7.4 % (ref 0.0–14.0)
NEUT#: 4.4 10*3/uL (ref 1.5–6.5)
NEUT%: 57.9 % (ref 39.0–75.0)
Platelets: 199 10*3/uL (ref 140–400)
RBC: 3.83 10*6/uL — ABNORMAL LOW (ref 4.20–5.82)
RDW: 16.2 % — AB (ref 11.0–14.6)
WBC: 7.5 10*3/uL (ref 4.0–10.3)

## 2016-07-14 LAB — URINALYSIS, MICROSCOPIC - CHCC
BILIRUBIN (URINE): NEGATIVE
GLUCOSE UR CHCC: NEGATIVE mg/dL
KETONES: NEGATIVE mg/dL
Nitrite: NEGATIVE
Protein: 30 mg/dL
SPECIFIC GRAVITY, URINE: 1.015 (ref 1.003–1.035)
Urobilinogen, UR: 0.2 mg/dL (ref 0.2–1)
pH: 6 (ref 4.6–8.0)

## 2016-07-14 LAB — PROTIME-INR
INR: 1.4 — ABNORMAL LOW (ref 2.00–3.50)
Protime: 16.8 Seconds — ABNORMAL HIGH (ref 10.6–13.4)

## 2016-07-14 NOTE — Telephone Encounter (Signed)
Wife called stating pt has blood in urine to extent urine is red, this is since his last chemo. He had rituxan on 12/14. He has hx of renal stents. He denies fever,pain,chills, n/v sx of UTI. They did have to increase his coumadin recently for low INR. Dr Benay Spice not in office. S/w Selena Lesser and will have pt come in for labs and [facility]. Wife said they could be here by 2. inbasket sent. Cbc, cmet, ua and culture, pt-inr ordered.

## 2016-07-15 ENCOUNTER — Other Ambulatory Visit: Payer: Self-pay | Admitting: Oncology

## 2016-07-15 DIAGNOSIS — R310 Gross hematuria: Secondary | ICD-10-CM | POA: Diagnosis not present

## 2016-07-15 DIAGNOSIS — N139 Obstructive and reflux uropathy, unspecified: Secondary | ICD-10-CM | POA: Diagnosis not present

## 2016-07-16 ENCOUNTER — Encounter: Payer: Self-pay | Admitting: Nurse Practitioner

## 2016-07-16 ENCOUNTER — Other Ambulatory Visit: Payer: Self-pay | Admitting: *Deleted

## 2016-07-16 ENCOUNTER — Ambulatory Visit (INDEPENDENT_AMBULATORY_CARE_PROVIDER_SITE_OTHER): Payer: Medicare Other | Admitting: *Deleted

## 2016-07-16 DIAGNOSIS — Z45018 Encounter for adjustment and management of other part of cardiac pacemaker: Secondary | ICD-10-CM

## 2016-07-16 DIAGNOSIS — R319 Hematuria, unspecified: Secondary | ICD-10-CM | POA: Insufficient documentation

## 2016-07-16 LAB — CUP PACEART INCLINIC DEVICE CHECK
Implantable Lead Implant Date: 20081013
Implantable Lead Location: 753860
Implantable Lead Model: 4457
Implantable Lead Serial Number: 205344
Implantable Pulse Generator Implant Date: 20081013
MDC IDC LEAD IMPLANT DT: 20081013
MDC IDC LEAD LOCATION: 753859
MDC IDC LEAD MODEL: 4470
MDC IDC LEAD SERIAL: 210110
MDC IDC PG SERIAL: 317986
MDC IDC SESS DTM: 20171221182518

## 2016-07-16 LAB — URINE CULTURE

## 2016-07-16 MED ORDER — WARFARIN SODIUM 5 MG PO TABS: 5.0000 mg | ORAL_TABLET | Freq: Every day | ORAL | 0 refills | Status: DC

## 2016-07-16 NOTE — Progress Notes (Signed)
SYMPTOM MANAGEMENT CLINIC    Chief Complaint: Hematuria  HPI:  Edward Woodward 82 y.o. male diagnosed with lymphoma.  Currently undergoing maintenance Rituxan infusions.    No history exists.    Review of Systems  Genitourinary: Positive for flank pain and hematuria.  All other systems reviewed and are negative.   Past Medical History:  Diagnosis Date  . AMD (age-related macular degeneration), bilateral   . Anemia   . Anginal pain ([place])    pts wife states pt had to use nitroglycerin this am 04/09/2016  . CAD (coronary artery disease)     NYHA Class 1B, CABG 1985  . CHF (congestive heart failure) ([place])   . Collagen vascular disease ([place])   . Collapsed lung    right   . Complication of anesthesia    pts wife states pt gets confused   . Degeneration of lumbar or lumbosacral intervertebral disc   . Depressive disorder, not elsewhere classified   . Dysrhythmia   . GERD (gastroesophageal reflux disease)   . History of blood transfusion   . History of hiatal hernia   . HTN (hypertension)   . Hypercholesteremia   . Lower leg edema    bilat  . Multiple falls   . Myocardial infarction   . Neuromuscular disorder (HCC)    neuropathy  . Neuropathy ([place])   . Non Hodgkin's lymphoma ([place])   . OSA on CPAP   . PE (pulmonary embolism) 03/2015  . Presence of permanent cardiac pacemaker   . Prostate cancer ([place])   . Shortness of breath dyspnea    with exertion   . Sinoatrial node dysfunction (HCC)   . Ulcers of both lower legs (HCC)    history of   . Urinary incontinence     Past Surgical History:  Procedure Laterality Date  . APPENDECTOMY    . BACK SURGERY    . CARDIAC CATHETERIZATION N/A 08/27/2015   Procedure: Left Heart Cath and Cors/Grafts Angiography;  Surgeon: Belva Crome, MD;  Location: [facility] CV LAB;  Service: Cardiovascular;  Laterality: N/A;  . COLONOSCOPY N/A 08/26/2015   Procedure: COLONOSCOPY;  Surgeon: Clarene Essex, MD;  Location: [facility] ENDOSCOPY;   Service: Endoscopy;  Laterality: N/A;  . CORONARY ANGIOPLASTY WITH STENT PLACEMENT     s/p bare metal stent implant SVG-diagonal 11/23/05, s/p BM stent implantation SVG diagonal 10/22/06 (feeds LAD), and 05/2010 with DES to left circumflex  . CORONARY ARTERY BYPASS GRAFT    . CYSTOSCOPY W/ URETERAL STENT PLACEMENT Bilateral 04/10/2016   Procedure: CYSTOSCOPY WITH RETROGRADE PYELOGRAM/URETERAL STENT PLACEMENT;  Surgeon: Festus Aloe, MD;  Location: [facility] ORS;  Service: Urology;  Laterality: Bilateral;  . ENDARTERECTOMY     bilat   . ESOPHAGOGASTRODUODENOSCOPY (EGD) WITH PROPOFOL N/A 08/26/2015   Procedure: ESOPHAGOGASTRODUODENOSCOPY (EGD) WITH PROPOFOL;  Surgeon: Clarene Essex, MD;  Location: [facility] ENDOSCOPY;  Service: Endoscopy;  Laterality: N/A;  . EXPLORATORY LAPAROTOMY    . LUMBAR FUSION    . PACEMAKER INSERTION    . ROTATOR CUFF REPAIR     bilat  . TONSILLECTOMY      has Nodular lymphoma of extranodal and/or solid organ site [facility]); Peripheral vascular disease ([place]); Atherosclerosis of coronary artery bypass graft of native heart without angina pectoris; Hyperlipidemia; Essential hypertension, benign; Dyspnea; OSA on CPAP; Sinus node dysfunction chronotropic incompetence; Cardiac pacemaker -[employer]; Atrial fibrillation ([place]); Pulmonary embolus ([place]); CKD (chronic kidney disease), stage III; Chronic anemia; Acute respiratory failure with hypoxia ([place]); Encounter for chemotherapy  management; Chest pain; Chronic diastolic heart failure ([place]); Pain in the chest; NSTEMI (non-ST elevated myocardial infarction) ([place]); Iron deficiency anemia due to chronic blood loss; Dehydration; Long term current use of anticoagulant therapy; Peripheral edema; Rectal bleeding; Prostate cancer ([place]); Falls; Failure to thrive in adult; Retroperitoneal mass; Hydronephrosis; AKI (acute kidney injury) ([place]); Weakness; Supratherapeutic INR; Encephalopathy acute; Palliative care encounter; Weakness generalized; Myoclonus;  Gait instability; Hypercalcemia; Sacral decubitus ulcer, stage III ([place]); Hypercalcemia of malignancy; Protein-calorie malnutrition, severe; and Hematuria on his problem list.    is allergic to penicillins and simvastatin.  Allergies as of 07/14/2016      Reactions   Penicillins Hives, Shortness Of Breath, Other (See Comments)   Has patient had a PCN reaction causing immediate rash, facial/tongue/throat swelling, SOB or lightheadedness with hypotension: Yes Has patient had a PCN reaction causing severe rash involving mucus membranes or skin necrosis: No Has patient had a PCN reaction that required hospitalization No Has patient had a PCN reaction occurring within the last 10 years: No If all of the above answers are "NO", then may proceed with Cephalosporin use.   Simvastatin Other (See Comments)   Reaction:  Muscle weakness      Medication List       Accurate as of 07/14/16 11:59 PM. Always use your most recent med list.          abiraterone Acetate 250 MG tablet Commonly known as:  ZYTIGA Take 4 tablets (1,000 mg total) by mouth daily. Take on an empty stomach 1 hour before or 2 hours after a meal   acetaminophen 500 MG tablet Commonly known as:  TYLENOL Take 1,000 mg by mouth every 6 (six) hours as needed for mild pain, moderate pain or headache.   benzocaine 10 % mucosal gel Commonly known as:  ORAJEL Use as directed 1 application in the mouth or throat as needed for mouth pain.   citalopram 20 MG tablet Commonly known as:  CELEXA Take 20 mg by mouth at bedtime.   clonazePAM 0.25 MG disintegrating tablet Commonly known as:  KLONOPIN DISSOLVE ONE-HALF TABLET IN MOUTH TWICE DAILY   fluticasone 50 MCG/ACT nasal spray Commonly known as:  FLONASE Place 1 spray into both nostrils daily as needed for rhinitis.   isosorbide mononitrate 30 MG 24 hr tablet Commonly known as:  IMDUR Take 30 mg by mouth at bedtime.   LIPITOR 40 MG tablet Generic drug:  atorvastatin Take  40 mg by mouth every other day.   Lutein-Zeaxanthin 25-5 MG Caps Take 1 capsule by mouth daily.   magnesium hydroxide 400 MG/5ML suspension Commonly known as:  MILK OF MAGNESIA Take 5 mLs by mouth daily as needed for mild constipation.   Melatonin 3 MG Tabs Take 3 mg by mouth at bedtime.   metoprolol succinate 50 MG 24 hr tablet Commonly known as:  TOPROL-XL Take 25 mg by mouth at bedtime. Take with or immediately following a meal.   nitroGLYCERIN 0.4 MG/SPRAY spray Commonly known as:  NITROLINGUAL Place 1 spray under the tongue every 5 (five) minutes x 3 doses as needed for chest pain.   ondansetron 4 MG tablet Commonly known as:  ZOFRAN Take 1 tablet (4 mg total) by mouth every 8 (eight) hours as needed for nausea or vomiting.   predniSONE 5 MG tablet Commonly known as:  DELTASONE Take 5 mg by mouth daily with breakfast.   predniSONE 5 MG tablet Commonly known as:  DELTASONE TAKE ONE TABLET BY MOUTH ONCE DAILY  SYSTANE 0.4-0.3 % Soln Generic drug:  Polyethyl Glycol-Propyl Glycol Place 1 drop into both eyes at bedtime.   torsemide 20 MG tablet Commonly known as:  DEMADEX Take 10 mg by mouth 2 (two) times daily.   vitamin B-12 1000 MCG tablet Commonly known as:  CYANOCOBALAMIN Take 1,000 mcg by mouth daily.   warfarin 5 MG tablet Commonly known as:  COUMADIN Take 2.5 mg by mouth at bedtime.        PHYSICAL EXAMINATION  Oncology Vitals 07/14/2016 07/09/2016  Height 178 cm -  Weight (No Data) -  Weight (lbs) (No Data) -  BMI (kg/m2) - -  Temp 97.6 98.2  Pulse 65 65  Resp 16 17  Resp (Historical as of 02/25/12) - -  SpO2 99 97  BSA (m2) - -   BP Readings from Last 2 Encounters:  07/14/16 (!) 127/56  07/09/16 (!) 126/59    Physical Exam  Constitutional: He is oriented to person, place, and time and well-developed, well-nourished, and in no distress.  HENT:  Head: Normocephalic and atraumatic.  Mouth/Throat: Oropharynx is clear and moist.  Eyes:  Conjunctivae and EOM are normal. Pupils are equal, round, and reactive to light. Right eye exhibits no discharge. Left eye exhibits no discharge. No scleral icterus.  Neck: Normal range of motion. Neck supple. No JVD present. No tracheal deviation present. No thyromegaly present.  Cardiovascular: Normal rate, regular rhythm, normal heart sounds and intact distal pulses.   Pulmonary/Chest: Effort normal and breath sounds normal. No respiratory distress. He has no wheezes. He has no rales. He exhibits no tenderness.  Abdominal: Soft. Bowel sounds are normal. He exhibits no distension and no mass. There is no tenderness. There is no rebound and no guarding.  Musculoskeletal: Normal range of motion. He exhibits no edema, tenderness or deformity.  Lymphadenopathy:    He has no cervical adenopathy.  Neurological: He is alert and oriented to person, place, and time. Gait normal.  Skin: Skin is warm and dry. No rash noted. No erythema. There is pallor.  Psychiatric: Affect normal.  Nursing note and vitals reviewed.   LABORATORY DATA:. Appointment on 07/14/2016  Component Date Value Ref Range Status  . WBC 07/14/2016 7.5  4.0 - 10.3 10e3/uL Final  . NEUT# 07/14/2016 4.4  1.5 - 6.5 10e3/uL Final  . HGB 07/14/2016 11.1* 13.0 - 17.1 g/dL Final  . HCT 07/14/2016 34.1* 38.4 - 49.9 % Final  . Platelets 07/14/2016 199  140 - 400 10e3/uL Final  . MCV 07/14/2016 89.1  79.3 - 98.0 fL Final  . MCH 07/14/2016 29.0  27.2 - 33.4 pg Final  . MCHC 07/14/2016 32.5  32.0 - 36.0 g/dL Final  . RBC 07/14/2016 3.83* 4.20 - 5.82 10e6/uL Final  . RDW 07/14/2016 16.2* 11.0 - 14.6 % Final  . lymph# 07/14/2016 2.2  0.9 - 3.3 10e3/uL Final  . MONO# 07/14/2016 0.6  0.1 - 0.9 10e3/uL Final  . Eosinophils Absolute 07/14/2016 0.3  0.0 - 0.5 10e3/uL Final  . Basophils Absolute 07/14/2016 0.1  0.0 - 0.1 10e3/uL Final  . NEUT% 07/14/2016 57.9  39.0 - 75.0 % Final  . LYMPH% 07/14/2016 29.8  14.0 - 49.0 % Final  . MONO%  07/14/2016 7.4  0.0 - 14.0 % Final  . EOS% 07/14/2016 4.0  0.0 - 7.0 % Final  . BASO% 07/14/2016 0.9  0.0 - 2.0 % Final  . Sodium 07/14/2016 141  136 - 145 mEq/L Final  . Potassium 07/14/2016 4.1  3.5 - 5.1 mEq/L Final  . Chloride 07/14/2016 104  98 - 109 mEq/L Final  . CO2 07/14/2016 29  22 - 29 mEq/L Final  . Glucose 07/14/2016 107  70 - 140 mg/dl Final  . BUN 07/14/2016 26.0  7.0 - 26.0 mg/dL Final  . Creatinine 07/14/2016 1.6* 0.7 - 1.3 mg/dL Final  . Total Bilirubin 07/14/2016 0.62  0.20 - 1.20 mg/dL Final  . Alkaline Phosphatase 07/14/2016 63  40 - 150 U/L Final  . AST 07/14/2016 17  5 - 34 U/L Final  . ALT 07/14/2016 10  0 - 55 U/L Final  . Total Protein 07/14/2016 6.1* 6.4 - 8.3 g/dL Final  . Albumin 07/14/2016 3.4* 3.5 - 5.0 g/dL Final  . Calcium 07/14/2016 10.1  8.4 - 10.4 mg/dL Final  . Anion Gap 07/14/2016 9  3 - 11 mEq/L Final  . EGFR 07/14/2016 37* >90 ml/min/1.73 m2 Final  . Glucose 07/14/2016 Negative  Negative mg/dL Final  . Bilirubin (Urine) 07/14/2016 Negative  Negative Final  . Ketones 07/14/2016 Negative  Negative mg/dL Final  . Specific Gravity, Urine 07/14/2016 1.015  1.003 - 1.035 Final  . Blood 07/14/2016 Large  Negative Final  . pH 07/14/2016 6.0  4.6 - 8.0 Final  . Protein 07/14/2016 30  Negative- <30 mg/dL Final  . Urobilinogen, UR 07/14/2016 0.2  0.2 - 1 mg/dL Final  . Nitrite 07/14/2016 Negative  Negative Final  . Leukocyte Esterase 07/14/2016 Trace  Negative Final  . RBC / HPF 07/14/2016 TNTC  0 - 2 Final  . WBC, UA 07/14/2016 3-6  0 - 2 Final  . Bacteria, UA 07/14/2016 Few  Negative- Trace Final  . Casts 07/14/2016 Few Hyaline, occ granular  Negative Final  . Epithelial Cells 07/14/2016 Few  Negative- Few Final  . Mucus, UA 07/14/2016 Small  Negative- Small Final  . Urine Culture, Routine 07/16/2016 Final report   Final  . Urine Culture result 1 07/16/2016 Comment   Final   Comment: Mixed urogenital flora 10,000-25,000 colony forming units per  mL   . Protime 07/14/2016 16.8* 10.6 - 13.4 Seconds Final  . INR 07/14/2016 1.40* 2.00 - 3.50 Final   Comment: INR is useful only to assess adequacy of anticoagulation with coumadin when comparing results from different labs. It should not be used to estimate bleeding risk or presence/abscense of coagulopathy in patients not on coumadin. Expected INR ranges for  nontherapeutic patients is 0.88 - 1.12.   Marland Kitchen Lovenox 07/14/2016 No   Final    RADIOGRAPHIC STUDIES: No results found.  ASSESSMENT/PLAN:    Nodular lymphoma of extranodal and/or solid organ site [facility]) Patient completed his last weekly Rituxan on 07/09/2016.  He will continue to receive Rituxan on a maintenance basis every 3 months.  He is scheduled for labs only on 07/23/2016.  He is scheduled for labs and a follow-up visit on 08/11/2015.  Long term current use of anticoagulant therapy Patient has a history of DVT and continues to take Coumadin on a regular basis.  INR today was 1.4.  Patient states he has been taking Coumadin 2.5 mg on Mondays, Wednesdays, and Fridays.  He takes 5 mg of the Coumadin on every other day of the week.  Since patient is experiencing hematuria at this point-will hold on increasing the patient's Coumadin dose for the time being secondary to the hematuria.  Patient has an appointment for follow-up and further evaluation at his urologist office tomorrow morning.  Reviewed all findings and lab  results with Dr. Julien Nordmann on call physician as well.  Hematuria Patient states he has been experiencing some gross hematuria for the past few days.  He denies any dysuria or hesitancy.  He denies any frequency of urination.  He denies any recent fevers or chills.  He is complaining of some vague left flank pain on occasion.  Patient's creatinine has increased from 1.5 up to 1.6 as well.  Patient continues to take Coumadin as directed as well.  INR today was 1.4.  On exam today.  Patient appears nontoxic.  Labs  are essentially stable and patient is afebrile today.  Urinalysis obtained today reveals questionable mild UTI symptoms only.  Patient has a history of bilateral ureter stents.  This provider called [facility] and obtain an appointment for patient to see the urology nurse practitioner tomorrow morning, Wednesday, 07/15/2016 at 9:45 AM for further evaluation and management.  Also, patient was advised to call/return or go directly to the emergency department for any worsening symptoms whatsoever.   Patient stated understanding of all instructions; and was in agreement with this plan of care. The patient knows to call the clinic with any problems, questions or concerns.   Total time spent with patient was 25 minutes;  with greater than 75 percent of that time spent in face to face counseling regarding patient's symptoms,  and coordination of care and follow up.  Disclaimer:This dictation was prepared with Dragon/digital dictation along with [employer]. Any transcriptional errors that result from this process are unintentional.  [name], NP 07/16/2016

## 2016-07-16 NOTE — Assessment & Plan Note (Signed)
Patient has a history of DVT and continues to take Coumadin on a regular basis.  INR today was 1.4.  Patient states he has been taking Coumadin 2.5 mg on Mondays, Wednesdays, and Fridays.  He takes 5 mg of the Coumadin on every other day of the week.  Since patient is experiencing hematuria at this point-will hold on increasing the patient's Coumadin dose for the time being secondary to the hematuria.  Patient has an appointment for follow-up and further evaluation at his urologist office tomorrow morning.  Reviewed all findings and lab results with Dr. Julien Nordmann on call physician as well.

## 2016-07-16 NOTE — Assessment & Plan Note (Signed)
Patient states he has been experiencing some gross hematuria for the past few days.  He denies any dysuria or hesitancy.  He denies any frequency of urination.  He denies any recent fevers or chills.  He is complaining of some vague left flank pain on occasion.  Patient's creatinine has increased from 1.5 up to 1.6 as well.  Patient continues to take Coumadin as directed as well.  INR today was 1.4.  On exam today.  Patient appears nontoxic.  Labs are essentially stable and patient is afebrile today.  Urinalysis obtained today reveals questionable mild UTI symptoms only.  Patient has a history of bilateral ureter stents.  This provider called [facility] and obtain an appointment for patient to see the urology nurse practitioner tomorrow morning, Wednesday, 07/15/2016 at 9:45 AM for further evaluation and management.  Also, patient was advised to call/return or go directly to the emergency department for any worsening symptoms whatsoever.

## 2016-07-16 NOTE — Telephone Encounter (Signed)
Received refill request for Coumadin. Noted pt was seen in symptom management clinic for hematuria and referred to urology. Spoke wth pt's wife, she reports urologist confirmed stents are intact and functional. They have not noticed any further bleeding. Pt is currently taking Coumadin 5mg  daily except 2.5mg  (half tab) on MWF. Reviewed with [name], NP: Continue same dose of Coumadin. Recheck lab 12/28.

## 2016-07-16 NOTE — Progress Notes (Signed)
Battery check only.   Longevity remaining 14yr.   ROV with DC 3/26

## 2016-07-16 NOTE — Assessment & Plan Note (Signed)
Patient completed his last weekly Rituxan on 07/09/2016.  He will continue to receive Rituxan on a maintenance basis every 3 months.  He is scheduled for labs only on 07/23/2016.  He is scheduled for labs and a follow-up visit on 08/11/2015.

## 2016-07-17 ENCOUNTER — Telehealth: Payer: Self-pay | Admitting: Pharmacist

## 2016-07-17 NOTE — Telephone Encounter (Signed)
Oral Chemotherapy Pharmacist Encounter  Received new [place] ([employer]) application by fax from manufacturer. I called [employer] at [phone] to inquire about about status of patient's re-enrollment application and if I needed to complete the new application they had faxed to the office.  We had sent a new prior authorization approval and proof of 2017 out of pocket medication expenses to [employer] on 07/02/16.  I was told today that [employer] has everything that they need to fully review patient's application for [id number] and there is no need to send in new application. Final determination of patient's enrollment for 2018 will not occur until at least 07/28/16.  I inquired when the patient had received their last fill of Zytiga and was informed this was shipped out to patient on 07/14/16.  This encounter will continue to be updated until final determination.  Oral Oncology Clinic will continue to follow.   [name], PharmD, BCPS, BCOP 07/17/2016  2:40 PM Oral Oncology Clinic [phone]

## 2016-07-23 ENCOUNTER — Other Ambulatory Visit (HOSPITAL_BASED_OUTPATIENT_CLINIC_OR_DEPARTMENT_OTHER): Payer: Medicare Other

## 2016-07-23 DIAGNOSIS — I509 Heart failure, unspecified: Secondary | ICD-10-CM | POA: Diagnosis not present

## 2016-07-23 DIAGNOSIS — C8598 Non-Hodgkin lymphoma, unspecified, lymph nodes of multiple sites: Secondary | ICD-10-CM | POA: Diagnosis not present

## 2016-07-23 DIAGNOSIS — I4891 Unspecified atrial fibrillation: Secondary | ICD-10-CM

## 2016-07-23 DIAGNOSIS — Z86718 Personal history of other venous thrombosis and embolism: Secondary | ICD-10-CM | POA: Diagnosis present

## 2016-07-23 DIAGNOSIS — I2510 Atherosclerotic heart disease of native coronary artery without angina pectoris: Secondary | ICD-10-CM | POA: Diagnosis not present

## 2016-07-23 DIAGNOSIS — Z7901 Long term (current) use of anticoagulants: Secondary | ICD-10-CM

## 2016-07-23 DIAGNOSIS — E43 Unspecified severe protein-calorie malnutrition: Secondary | ICD-10-CM | POA: Diagnosis not present

## 2016-07-23 DIAGNOSIS — E785 Hyperlipidemia, unspecified: Secondary | ICD-10-CM | POA: Diagnosis not present

## 2016-07-23 DIAGNOSIS — I110 Hypertensive heart disease with heart failure: Secondary | ICD-10-CM | POA: Diagnosis not present

## 2016-07-23 LAB — PROTIME-INR
INR: 1.6 — ABNORMAL LOW (ref 2.00–3.50)
Protime: 19.2 Seconds — ABNORMAL HIGH (ref 10.6–13.4)

## 2016-07-24 ENCOUNTER — Telehealth: Payer: Self-pay | Admitting: *Deleted

## 2016-07-24 ENCOUNTER — Other Ambulatory Visit: Payer: Self-pay | Admitting: *Deleted

## 2016-07-24 DIAGNOSIS — C8299 Follicular lymphoma, unspecified, extranodal and solid organ sites: Secondary | ICD-10-CM

## 2016-07-24 NOTE — Telephone Encounter (Signed)
Call placed back to [facility] and she confirms that she received my prior message and has no questions at this time.

## 2016-07-24 NOTE — Telephone Encounter (Signed)
Per Dr. Benay Spice, message left with [name] to continue same dose of Coumadin.  Dose confirmed as 5 mg daily; except 2.5 mg on Monday, Wednesday and Friday and that we will recheck PT at next visit.  Instructed [name] to call [place] back to confirm she has received this message.

## 2016-07-28 ENCOUNTER — Other Ambulatory Visit: Payer: Self-pay | Admitting: Neurology

## 2016-07-30 DIAGNOSIS — E785 Hyperlipidemia, unspecified: Secondary | ICD-10-CM | POA: Diagnosis not present

## 2016-07-30 DIAGNOSIS — C8598 Non-Hodgkin lymphoma, unspecified, lymph nodes of multiple sites: Secondary | ICD-10-CM | POA: Diagnosis not present

## 2016-07-30 DIAGNOSIS — I110 Hypertensive heart disease with heart failure: Secondary | ICD-10-CM | POA: Diagnosis not present

## 2016-07-30 DIAGNOSIS — I509 Heart failure, unspecified: Secondary | ICD-10-CM | POA: Diagnosis not present

## 2016-07-30 DIAGNOSIS — I2510 Atherosclerotic heart disease of native coronary artery without angina pectoris: Secondary | ICD-10-CM | POA: Diagnosis not present

## 2016-07-30 DIAGNOSIS — E43 Unspecified severe protein-calorie malnutrition: Secondary | ICD-10-CM | POA: Diagnosis not present

## 2016-08-03 ENCOUNTER — Telehealth: Payer: Self-pay | Admitting: Pharmacist

## 2016-08-03 ENCOUNTER — Ambulatory Visit: Payer: Medicare Other | Admitting: Neurology

## 2016-08-03 ENCOUNTER — Telehealth: Payer: Self-pay | Admitting: Neurology

## 2016-08-03 NOTE — Telephone Encounter (Signed)
Looks like two months supply approved on 07/03/2016. Caryl Pina please advise.

## 2016-08-03 NOTE — Telephone Encounter (Signed)
Patient needs a refill on the [place] called into [facility] in [place] patient phone number is [phone]

## 2016-08-03 NOTE — Telephone Encounter (Signed)
Oral Chemotherapy Pharmacist Encounter  Received call from patient's wife [name] that she had spoken with [place] ([employer]) and they were requiring patient fill out new application again. [name] will complete the patient portion and bring to the office on 1/15 (patioent's next appointment) or before. We will complete the HCP portion and fax to [employer].  This encounter will continue to be updated until final determination.  Oral Oncology Clinic will continue to follow.   [name], PharmD, BCPS, BCOP 08/03/2016  3:54 PM Oral Oncology Clinic [phone]

## 2016-08-03 NOTE — Telephone Encounter (Signed)
Oral Chemotherapy Pharmacist Encounter  Received call from patient's wife, [name], inquiring about status of [employer] application. Per the last update we received, patient was approved through their program. I recommended that [name] call [employer] at [phone] to inquire about status.  [name] was instructed to let me know if [employer] needed anything else from our office for Mr. Colin's application.  Oral Oncology Clinic will continue to follow.  [name], PharmD, BCPS, BCOP 08/03/2016  2:34 PM Oral Oncology Clinic [phone]

## 2016-08-04 ENCOUNTER — Other Ambulatory Visit: Payer: Self-pay | Admitting: Neurology

## 2016-08-04 DIAGNOSIS — I2510 Atherosclerotic heart disease of native coronary artery without angina pectoris: Secondary | ICD-10-CM | POA: Diagnosis not present

## 2016-08-04 DIAGNOSIS — I110 Hypertensive heart disease with heart failure: Secondary | ICD-10-CM | POA: Diagnosis not present

## 2016-08-04 DIAGNOSIS — C8598 Non-Hodgkin lymphoma, unspecified, lymph nodes of multiple sites: Secondary | ICD-10-CM | POA: Diagnosis not present

## 2016-08-04 DIAGNOSIS — E43 Unspecified severe protein-calorie malnutrition: Secondary | ICD-10-CM | POA: Diagnosis not present

## 2016-08-04 DIAGNOSIS — E785 Hyperlipidemia, unspecified: Secondary | ICD-10-CM | POA: Diagnosis not present

## 2016-08-04 DIAGNOSIS — I509 Heart failure, unspecified: Secondary | ICD-10-CM | POA: Diagnosis not present

## 2016-08-04 NOTE — Telephone Encounter (Signed)
Patient still has a refill left on the last Rx.

## 2016-08-05 ENCOUNTER — Other Ambulatory Visit: Payer: Self-pay | Admitting: *Deleted

## 2016-08-05 MED ORDER — CLONAZEPAM 0.25 MG PO TBDP: ORAL_TABLET | ORAL | 5 refills | Status: DC

## 2016-08-05 NOTE — Telephone Encounter (Signed)
Oral Chemotherapy Pharmacist Encounter  I met patient's wife, [name], in [facility] lobby yesterday (1/9) to complete yet another [employer]PA[employer] application. Obtained MD signature and faxed new application, medicare D certification, PA info, 2017 drug out-of-pocket spend info, and income documents to [employer] at [phone].  This encounter will continue to be updated until final determination.  Oral Oncology Clinic will continue to follow.   [name], PharmD, BCPS, BCOP 08/05/2016  1:43 PM Oral Oncology Clinic [phone]

## 2016-08-06 ENCOUNTER — Telehealth: Payer: Self-pay | Admitting: Neurology

## 2016-08-06 DIAGNOSIS — E43 Unspecified severe protein-calorie malnutrition: Secondary | ICD-10-CM | POA: Diagnosis not present

## 2016-08-06 DIAGNOSIS — I2510 Atherosclerotic heart disease of native coronary artery without angina pectoris: Secondary | ICD-10-CM | POA: Diagnosis not present

## 2016-08-06 DIAGNOSIS — C8598 Non-Hodgkin lymphoma, unspecified, lymph nodes of multiple sites: Secondary | ICD-10-CM | POA: Diagnosis not present

## 2016-08-06 DIAGNOSIS — I509 Heart failure, unspecified: Secondary | ICD-10-CM | POA: Diagnosis not present

## 2016-08-06 DIAGNOSIS — I110 Hypertensive heart disease with heart failure: Secondary | ICD-10-CM | POA: Diagnosis not present

## 2016-08-06 DIAGNOSIS — E785 Hyperlipidemia, unspecified: Secondary | ICD-10-CM | POA: Diagnosis not present

## 2016-08-06 NOTE — Telephone Encounter (Signed)
Barnetta Chapel with [facility] left a message regarding a prescription for PT/Dawn CB# [phone]

## 2016-08-06 NOTE — Telephone Encounter (Signed)
Spoke with Barnetta Chapel and informed her that the Rx had changed.

## 2016-08-10 ENCOUNTER — Ambulatory Visit (HOSPITAL_BASED_OUTPATIENT_CLINIC_OR_DEPARTMENT_OTHER): Payer: Medicare Other | Admitting: Oncology

## 2016-08-10 ENCOUNTER — Telehealth: Payer: Self-pay | Admitting: Oncology

## 2016-08-10 ENCOUNTER — Other Ambulatory Visit (HOSPITAL_BASED_OUTPATIENT_CLINIC_OR_DEPARTMENT_OTHER): Payer: Medicare Other

## 2016-08-10 VITALS — BP 144/66 | HR 60 | Temp 97.9°F | Resp 18 | Ht 70.0 in | Wt 185.3 lb

## 2016-08-10 DIAGNOSIS — N189 Chronic kidney disease, unspecified: Secondary | ICD-10-CM | POA: Diagnosis not present

## 2016-08-10 DIAGNOSIS — Z86718 Personal history of other venous thrombosis and embolism: Secondary | ICD-10-CM | POA: Diagnosis not present

## 2016-08-10 DIAGNOSIS — C8299 Follicular lymphoma, unspecified, extranodal and solid organ sites: Secondary | ICD-10-CM

## 2016-08-10 DIAGNOSIS — I4891 Unspecified atrial fibrillation: Secondary | ICD-10-CM

## 2016-08-10 DIAGNOSIS — Z7901 Long term (current) use of anticoagulants: Secondary | ICD-10-CM

## 2016-08-10 DIAGNOSIS — C8223 Follicular lymphoma grade III, unspecified, intra-abdominal lymph nodes: Secondary | ICD-10-CM | POA: Diagnosis not present

## 2016-08-10 DIAGNOSIS — D630 Anemia in neoplastic disease: Secondary | ICD-10-CM

## 2016-08-10 DIAGNOSIS — C61 Malignant neoplasm of prostate: Secondary | ICD-10-CM | POA: Diagnosis not present

## 2016-08-10 LAB — COMPREHENSIVE METABOLIC PANEL
ALK PHOS: 54 U/L (ref 40–150)
ALT: 11 U/L (ref 0–55)
ANION GAP: 10 meq/L (ref 3–11)
AST: 19 U/L (ref 5–34)
Albumin: 3.6 g/dL (ref 3.5–5.0)
BUN: 29.4 mg/dL — ABNORMAL HIGH (ref 7.0–26.0)
CALCIUM: 10.3 mg/dL (ref 8.4–10.4)
CHLORIDE: 106 meq/L (ref 98–109)
CO2: 28 mEq/L (ref 22–29)
CREATININE: 1.6 mg/dL — AB (ref 0.7–1.3)
EGFR: 39 mL/min/{1.73_m2} — ABNORMAL LOW (ref >90)
Glucose: 87 mg/dl (ref 70–140)
Potassium: 4.2 mEq/L (ref 3.5–5.1)
Sodium: 144 mEq/L (ref 136–145)
TOTAL PROTEIN: 6.1 g/dL — AB (ref 6.4–8.3)
Total Bilirubin: 0.47 mg/dL (ref 0.20–1.20)

## 2016-08-10 LAB — PROTIME-INR
INR: 1.6 — AB (ref 2.00–3.50)
PROTIME: 19.2 s — AB (ref 10.6–13.4)

## 2016-08-10 LAB — CBC WITH DIFFERENTIAL/PLATELET
BASO%: 0.2 % (ref 0.0–2.0)
Basophils Absolute: 0 10*3/uL (ref 0.0–0.1)
EOS%: 2.1 % (ref 0.0–7.0)
Eosinophils Absolute: 0.2 10*3/uL (ref 0.0–0.5)
HEMATOCRIT: 34 % — AB (ref 38.4–49.9)
HGB: 11.2 g/dL — ABNORMAL LOW (ref 13.0–17.1)
LYMPH#: 2.5 10*3/uL (ref 0.9–3.3)
LYMPH%: 24.9 % (ref 14.0–49.0)
MCH: 29.8 pg (ref 27.2–33.4)
MCHC: 32.9 g/dL (ref 32.0–36.0)
MCV: 90.4 fL (ref 79.3–98.0)
MONO#: 0.5 10*3/uL (ref 0.1–0.9)
MONO%: 4.9 % (ref 0.0–14.0)
NEUT#: 7 10*3/uL — ABNORMAL HIGH (ref 1.5–6.5)
NEUT%: 67.9 % (ref 39.0–75.0)
PLATELETS: 152 10*3/uL (ref 140–400)
RBC: 3.76 10*6/uL — ABNORMAL LOW (ref 4.20–5.82)
RDW: 14.9 % — ABNORMAL HIGH (ref 11.0–14.6)
WBC: 10.2 10*3/uL (ref 4.0–10.3)

## 2016-08-10 NOTE — Telephone Encounter (Addendum)
Wife reports she called [employer] and they connected her to [employer] on [address]. She was told to call back around 3PM for determination.  Pt took his last dose of Zytiga on 08/09/16.

## 2016-08-10 NOTE — Telephone Encounter (Signed)
Appointments scheduled per 08/10/16 los. Patient was given a copy of the AVS report and appointment schedule, per 08/10/16 los.  °

## 2016-08-10 NOTE — Progress Notes (Signed)
[facility] OFFICE PROGRESS NOTE   Diagnosis: Non-Hodgkin's lymphoma, prostate cancer  INTERVAL HISTORY:   Mr. Naz returns as scheduled. He reports feeling well. His affect and energy level are much improved since completing rituximab therapy. He is participating in home physical therapy.  Objective:  Vital signs in last 24 hours:  Blood pressure (!) 144/66, pulse 60, temperature 97.9 F (36.6 C), temperature source Oral, resp. rate 18, height 5\' 10"  (1.778 m), weight 185 lb 4.8 oz (84.1 kg), SpO2 100 %.    HEENT: The mouth is dry, no thrush Lymphatics: No cervical or supraclavicular nodes Resp: Lungs clear bilaterally Cardio: Regular rate and rhythm GI: No hepatosplenomegaly, tender in the right upper quadrant, no mass Vascular: No leg edema  Lab Results:  Lab Results  Component Value Date   WBC 10.2 08/10/2016   HGB 11.2 (L) 08/10/2016   HCT 34.0 (L) 08/10/2016   MCV 90.4 08/10/2016   PLT 152 08/10/2016   NEUTROABS 7.0 (H) 08/10/2016   Creatinine 1.6, calcium 10.3, albumin 3.6 PT/INR-1.6  Medications: I have reviewed the patient's current medications.  Assessment/Plan: 1. Non-Hodgkin's lymphoma (follicular grade 3 lymphoma) - status post 6 cycles of Cytoxan/prednisone/rituximab 07/01/2007 through 10/21/2007.   Biopsy of a retroperitoneal mass 05/13/2016 consistent with involvement by low-grade non-Hodgkin's lymphoma  Initiation of weekly Rituxan 06/17/2016; week 4 given 07/09/2016. 2. CT chest 04/01/2015 with no lymphadenopathy  CT abdomen/pelvis 05/01/2015 with mild progression of abdomen/pelvic lymphadenopathy 3. Prostate cancer - he has been treated with hormonal therapy, the PSA was stable on 10/15/2014  PSA increased 04/26/2015   Bone scan 05/09/2015 with unchanged increased activity in the thoracic and lumbar spine felt to most likely be degenerative.   Initiation of Abiraterone and prednisone 05/11/2015. Normalization of the PSA  on Abiraterone. 4. History of a normocytic anemia secondary to non-Hodgkin's lymphoma, chemotherapy, and renal insufficiency - progressive secondary to GI bleeding, stable 5. History of Mild thrombocytopenia  6. History of bilateral hydronephrosis. Renal ultrasound 09/26/2014 consistent with medical renal disease. No hydronephrosis.  7. Coronary artery disease - status post coronary artery stent placement. 8. History of noncalcified lung nodules. 9. History of severe neutropenia July 2009 - likely related to delayed toxicity from rituximab. 10. Macular degeneration. 11. Right middle lobe density on a CT of the chest 06/18/2010 measuring 2.5 x 1.6 cm with mildly increased metabolic activity (SUV max 2.5) on a PET scan 06/27/2010. The area of increased density was less confluent and appeared smaller on a restaging CT 09/08/2010. Right middle lobe "scarring" with no new or suspicious pulmonary nodule on a CT 08/04/2011. 12. Mild increased metabolic activity associated with several lymph nodes on the PET scan 06/27/2010 - likely related to lymphoma. No lymphadenopathy in the mediastinum or axillary areas on the CT 08/04/2011. 13. Right hip discomfort-he received a steroid injection by his primary physician without improvement. He saw Dr. Collier Salina and there was a question of a lytic process in the right pelvis. A bone scan on 09/21/2012 revealed no evidence of metastatic disease. 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 Renal insufficiency-progressive CT 01/12/2016 confirmed progressive hydronephrosis  Placement of bilateral ureter stents 04/10/2016 15. Left leg DVT and pulmonary embolism 04/01/2015-on Coumadin 16. Admission 08/23/2015 with severe anemia and GI bleeding  Upper and lower endoscopy 08/26/2015 without a clear source for bleeding identified 17. Leg edema/anasarca-improved with leg wrapping and diuretics 18. Anemia secondary to chronic disease, renal  failure, and possibly lymphoma 19. 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   Biopsy retroperitoneal soft tissue 05/13/2016 20. History of elevated calcium-most likely secondary to non-Hodgkin's lymphoma status post pamidronate 06/08/2016, calcitonin 06/11/2016    Disposition:  Mr. Laspada has experienced clinical improvement following rituximab therapy. The anemia has improved. He will return for an office visit in one month. We will discuss maintenance rituximab therapy when he returns next month. We will also consider a restaging CT prior to his follow-up visit with Dr. Junious Silk. He may be a candidate to have the ureter stents removed if the retroperitoneal lymphadenopathy has improved or resolved.  The PT/INR is in the goal range today.  25 minutes were spent with the patient today. The majority of the time was used for counseling and coordination of care.  [name], MD  08/10/2016  11:35 AM

## 2016-08-11 DIAGNOSIS — I509 Heart failure, unspecified: Secondary | ICD-10-CM | POA: Diagnosis not present

## 2016-08-11 DIAGNOSIS — C8598 Non-Hodgkin lymphoma, unspecified, lymph nodes of multiple sites: Secondary | ICD-10-CM | POA: Diagnosis not present

## 2016-08-11 DIAGNOSIS — I2510 Atherosclerotic heart disease of native coronary artery without angina pectoris: Secondary | ICD-10-CM | POA: Diagnosis not present

## 2016-08-11 DIAGNOSIS — E43 Unspecified severe protein-calorie malnutrition: Secondary | ICD-10-CM | POA: Diagnosis not present

## 2016-08-11 DIAGNOSIS — E785 Hyperlipidemia, unspecified: Secondary | ICD-10-CM | POA: Diagnosis not present

## 2016-08-11 DIAGNOSIS — I110 Hypertensive heart disease with heart failure: Secondary | ICD-10-CM | POA: Diagnosis not present

## 2016-08-11 NOTE — Telephone Encounter (Signed)
Oral Chemotherapy Pharmacist Encounter  Received call from [place] that they needed [place] to re-fax page 1 of application sent on [id number]. Attached copy of prescription card was covering the revision date printed on the lower left hand corner of the application. They need the care removed and page 1 re-faxed.  Page 1 of application re-faxed to [employer] at [phone]. Representative stated that once they receive this information, they will make determination about Edward Woodward's enrollment into their PAF program.  Oral Oncology Clinic will continue to follow.  [name], PharmD, BCPS, BCOP 08/11/2016  1:55 PM Oral Oncology Clinic [phone]

## 2016-08-12 ENCOUNTER — Telehealth: Payer: Self-pay | Admitting: *Deleted

## 2016-08-12 NOTE — Telephone Encounter (Signed)
Gerald Stabs (Physical Therapist from [facility]) requesting verbal order to continue at home Physical Therapy. Call back # is [phone].

## 2016-08-12 NOTE — Telephone Encounter (Signed)
LM for Gerald Stabs with authorization to continue home physical therapy as previously directed. Please return call if any questions.

## 2016-08-13 DIAGNOSIS — C8598 Non-Hodgkin lymphoma, unspecified, lymph nodes of multiple sites: Secondary | ICD-10-CM | POA: Diagnosis not present

## 2016-08-13 DIAGNOSIS — E43 Unspecified severe protein-calorie malnutrition: Secondary | ICD-10-CM | POA: Diagnosis not present

## 2016-08-13 DIAGNOSIS — I509 Heart failure, unspecified: Secondary | ICD-10-CM | POA: Diagnosis not present

## 2016-08-13 DIAGNOSIS — E785 Hyperlipidemia, unspecified: Secondary | ICD-10-CM | POA: Diagnosis not present

## 2016-08-13 DIAGNOSIS — I110 Hypertensive heart disease with heart failure: Secondary | ICD-10-CM | POA: Diagnosis not present

## 2016-08-13 DIAGNOSIS — I2510 Atherosclerotic heart disease of native coronary artery without angina pectoris: Secondary | ICD-10-CM | POA: Diagnosis not present

## 2016-08-13 NOTE — Telephone Encounter (Signed)
Oral Chemotherapy Pharmacist Encounter  Received notification from [place] that patient has been successfully enrolled into their program for Zytiga free-of-charge until 07/26/17. I called an updated patient's wife, [name]. She knows to call the office with any issues.  A copy of the [employer] approval letter will be sent to scan into patient's chart.  Oral Oncology Clinic will sign off at this time. Please let us know if we can be of assistance in the future.  [name], PharmD, BCPS, BCOP 08/13/2016  11:07 AM Oral Oncology Clinic [phone]

## 2016-08-14 ENCOUNTER — Other Ambulatory Visit: Payer: Self-pay | Admitting: Nurse Practitioner

## 2016-08-15 DIAGNOSIS — K219 Gastro-esophageal reflux disease without esophagitis: Secondary | ICD-10-CM | POA: Diagnosis not present

## 2016-08-15 DIAGNOSIS — G4733 Obstructive sleep apnea (adult) (pediatric): Secondary | ICD-10-CM | POA: Diagnosis not present

## 2016-08-15 DIAGNOSIS — Z7901 Long term (current) use of anticoagulants: Secondary | ICD-10-CM | POA: Diagnosis not present

## 2016-08-15 DIAGNOSIS — I110 Hypertensive heart disease with heart failure: Secondary | ICD-10-CM | POA: Diagnosis not present

## 2016-08-15 DIAGNOSIS — Z950 Presence of cardiac pacemaker: Secondary | ICD-10-CM | POA: Diagnosis not present

## 2016-08-15 DIAGNOSIS — Z8546 Personal history of malignant neoplasm of prostate: Secondary | ICD-10-CM | POA: Diagnosis not present

## 2016-08-15 DIAGNOSIS — I2510 Atherosclerotic heart disease of native coronary artery without angina pectoris: Secondary | ICD-10-CM | POA: Diagnosis not present

## 2016-08-15 DIAGNOSIS — E785 Hyperlipidemia, unspecified: Secondary | ICD-10-CM | POA: Diagnosis not present

## 2016-08-15 DIAGNOSIS — M5136 Other intervertebral disc degeneration, lumbar region: Secondary | ICD-10-CM | POA: Diagnosis not present

## 2016-08-15 DIAGNOSIS — H3530 Unspecified macular degeneration: Secondary | ICD-10-CM | POA: Diagnosis not present

## 2016-08-15 DIAGNOSIS — I509 Heart failure, unspecified: Secondary | ICD-10-CM | POA: Diagnosis not present

## 2016-08-15 DIAGNOSIS — E43 Unspecified severe protein-calorie malnutrition: Secondary | ICD-10-CM | POA: Diagnosis not present

## 2016-08-15 DIAGNOSIS — Z86711 Personal history of pulmonary embolism: Secondary | ICD-10-CM | POA: Diagnosis not present

## 2016-08-15 DIAGNOSIS — F329 Major depressive disorder, single episode, unspecified: Secondary | ICD-10-CM | POA: Diagnosis not present

## 2016-08-15 DIAGNOSIS — C8598 Non-Hodgkin lymphoma, unspecified, lymph nodes of multiple sites: Secondary | ICD-10-CM | POA: Diagnosis not present

## 2016-08-18 DIAGNOSIS — F329 Major depressive disorder, single episode, unspecified: Secondary | ICD-10-CM | POA: Diagnosis not present

## 2016-08-18 DIAGNOSIS — I2510 Atherosclerotic heart disease of native coronary artery without angina pectoris: Secondary | ICD-10-CM | POA: Diagnosis not present

## 2016-08-18 DIAGNOSIS — I110 Hypertensive heart disease with heart failure: Secondary | ICD-10-CM | POA: Diagnosis not present

## 2016-08-18 DIAGNOSIS — M5136 Other intervertebral disc degeneration, lumbar region: Secondary | ICD-10-CM | POA: Diagnosis not present

## 2016-08-18 DIAGNOSIS — C8598 Non-Hodgkin lymphoma, unspecified, lymph nodes of multiple sites: Secondary | ICD-10-CM | POA: Diagnosis not present

## 2016-08-18 DIAGNOSIS — I509 Heart failure, unspecified: Secondary | ICD-10-CM | POA: Diagnosis not present

## 2016-08-19 ENCOUNTER — Encounter: Payer: Self-pay | Admitting: Oncology

## 2016-08-19 ENCOUNTER — Telehealth: Payer: Self-pay | Admitting: Pharmacist

## 2016-08-19 NOTE — Progress Notes (Signed)
Copy of approval letter from [name] placed to be scanned by HIM.

## 2016-08-19 NOTE — Telephone Encounter (Signed)
Oral Chemotherapy Pharmacist Encounter  I met patient's wife, [name], in lobby of [facility] today with paperwork they had received from [employer] to fill out yet another Medicare Part D attestation. We sent a signed copy of this form to [employer] on 08/05/16. I called [employer] at [phone] for clarification. They did receive the attestation we sent earlier in the month. Patient is fully approved for the entire 2018 calendar year.  There is nothing else [employer] needs from [place] at this time.  I called and updated [name]. She expressed appreciation. She knows to call the office with any further questions or concerns.  [name], PharmD, BCPS, BCOP 08/19/2016  1:42 PM Oral Oncology Clinic [phone]

## 2016-08-21 ENCOUNTER — Other Ambulatory Visit: Payer: Self-pay | Admitting: Oncology

## 2016-08-21 DIAGNOSIS — M5136 Other intervertebral disc degeneration, lumbar region: Secondary | ICD-10-CM | POA: Diagnosis not present

## 2016-08-21 DIAGNOSIS — I110 Hypertensive heart disease with heart failure: Secondary | ICD-10-CM | POA: Diagnosis not present

## 2016-08-21 DIAGNOSIS — I2510 Atherosclerotic heart disease of native coronary artery without angina pectoris: Secondary | ICD-10-CM | POA: Diagnosis not present

## 2016-08-21 DIAGNOSIS — C8598 Non-Hodgkin lymphoma, unspecified, lymph nodes of multiple sites: Secondary | ICD-10-CM | POA: Diagnosis not present

## 2016-08-21 DIAGNOSIS — F329 Major depressive disorder, single episode, unspecified: Secondary | ICD-10-CM | POA: Diagnosis not present

## 2016-08-21 DIAGNOSIS — I509 Heart failure, unspecified: Secondary | ICD-10-CM | POA: Diagnosis not present

## 2016-08-24 DIAGNOSIS — M5136 Other intervertebral disc degeneration, lumbar region: Secondary | ICD-10-CM | POA: Diagnosis not present

## 2016-08-24 DIAGNOSIS — I110 Hypertensive heart disease with heart failure: Secondary | ICD-10-CM | POA: Diagnosis not present

## 2016-08-24 DIAGNOSIS — I509 Heart failure, unspecified: Secondary | ICD-10-CM | POA: Diagnosis not present

## 2016-08-24 DIAGNOSIS — F329 Major depressive disorder, single episode, unspecified: Secondary | ICD-10-CM | POA: Diagnosis not present

## 2016-08-24 DIAGNOSIS — C8598 Non-Hodgkin lymphoma, unspecified, lymph nodes of multiple sites: Secondary | ICD-10-CM | POA: Diagnosis not present

## 2016-08-24 DIAGNOSIS — I2510 Atherosclerotic heart disease of native coronary artery without angina pectoris: Secondary | ICD-10-CM | POA: Diagnosis not present

## 2016-08-25 DIAGNOSIS — Z85828 Personal history of other malignant neoplasm of skin: Secondary | ICD-10-CM | POA: Diagnosis not present

## 2016-08-25 DIAGNOSIS — L98499 Non-pressure chronic ulcer of skin of other sites with unspecified severity: Secondary | ICD-10-CM | POA: Diagnosis not present

## 2016-08-26 ENCOUNTER — Encounter: Payer: Self-pay | Admitting: *Deleted

## 2016-08-26 DIAGNOSIS — I110 Hypertensive heart disease with heart failure: Secondary | ICD-10-CM | POA: Diagnosis not present

## 2016-08-26 DIAGNOSIS — C8598 Non-Hodgkin lymphoma, unspecified, lymph nodes of multiple sites: Secondary | ICD-10-CM | POA: Diagnosis not present

## 2016-08-26 DIAGNOSIS — M5136 Other intervertebral disc degeneration, lumbar region: Secondary | ICD-10-CM | POA: Diagnosis not present

## 2016-08-26 DIAGNOSIS — I509 Heart failure, unspecified: Secondary | ICD-10-CM | POA: Diagnosis not present

## 2016-08-26 DIAGNOSIS — F329 Major depressive disorder, single episode, unspecified: Secondary | ICD-10-CM | POA: Diagnosis not present

## 2016-08-26 DIAGNOSIS — I2510 Atherosclerotic heart disease of native coronary artery without angina pectoris: Secondary | ICD-10-CM | POA: Diagnosis not present

## 2016-08-26 NOTE — Progress Notes (Signed)
Orders faxed to [facility] signed by Dr. Benay Spice

## 2016-08-31 DIAGNOSIS — C8598 Non-Hodgkin lymphoma, unspecified, lymph nodes of multiple sites: Secondary | ICD-10-CM | POA: Diagnosis not present

## 2016-08-31 DIAGNOSIS — I110 Hypertensive heart disease with heart failure: Secondary | ICD-10-CM | POA: Diagnosis not present

## 2016-08-31 DIAGNOSIS — I509 Heart failure, unspecified: Secondary | ICD-10-CM | POA: Diagnosis not present

## 2016-08-31 DIAGNOSIS — I2510 Atherosclerotic heart disease of native coronary artery without angina pectoris: Secondary | ICD-10-CM | POA: Diagnosis not present

## 2016-08-31 DIAGNOSIS — F329 Major depressive disorder, single episode, unspecified: Secondary | ICD-10-CM | POA: Diagnosis not present

## 2016-08-31 DIAGNOSIS — M5136 Other intervertebral disc degeneration, lumbar region: Secondary | ICD-10-CM | POA: Diagnosis not present

## 2016-09-01 ENCOUNTER — Telehealth: Payer: Self-pay

## 2016-09-01 NOTE — Telephone Encounter (Signed)
Nutrition Follow-up:  Called patient for nutrition follow-up but unable to leave message at this time. Will follow-up as able.  Edward Woodward, [place], [place] (pager)

## 2016-09-03 DIAGNOSIS — C8598 Non-Hodgkin lymphoma, unspecified, lymph nodes of multiple sites: Secondary | ICD-10-CM | POA: Diagnosis not present

## 2016-09-03 DIAGNOSIS — I2510 Atherosclerotic heart disease of native coronary artery without angina pectoris: Secondary | ICD-10-CM | POA: Diagnosis not present

## 2016-09-03 DIAGNOSIS — M5136 Other intervertebral disc degeneration, lumbar region: Secondary | ICD-10-CM | POA: Diagnosis not present

## 2016-09-03 DIAGNOSIS — F329 Major depressive disorder, single episode, unspecified: Secondary | ICD-10-CM | POA: Diagnosis not present

## 2016-09-03 DIAGNOSIS — I110 Hypertensive heart disease with heart failure: Secondary | ICD-10-CM | POA: Diagnosis not present

## 2016-09-03 DIAGNOSIS — I509 Heart failure, unspecified: Secondary | ICD-10-CM | POA: Diagnosis not present

## 2016-09-07 DIAGNOSIS — C8598 Non-Hodgkin lymphoma, unspecified, lymph nodes of multiple sites: Secondary | ICD-10-CM | POA: Diagnosis not present

## 2016-09-07 DIAGNOSIS — I2510 Atherosclerotic heart disease of native coronary artery without angina pectoris: Secondary | ICD-10-CM | POA: Diagnosis not present

## 2016-09-07 DIAGNOSIS — M5136 Other intervertebral disc degeneration, lumbar region: Secondary | ICD-10-CM | POA: Diagnosis not present

## 2016-09-07 DIAGNOSIS — I509 Heart failure, unspecified: Secondary | ICD-10-CM | POA: Diagnosis not present

## 2016-09-07 DIAGNOSIS — I110 Hypertensive heart disease with heart failure: Secondary | ICD-10-CM | POA: Diagnosis not present

## 2016-09-07 DIAGNOSIS — F329 Major depressive disorder, single episode, unspecified: Secondary | ICD-10-CM | POA: Diagnosis not present

## 2016-09-09 DIAGNOSIS — C61 Malignant neoplasm of prostate: Secondary | ICD-10-CM | POA: Diagnosis not present

## 2016-09-09 DIAGNOSIS — N1330 Unspecified hydronephrosis: Secondary | ICD-10-CM | POA: Diagnosis not present

## 2016-09-11 DIAGNOSIS — I509 Heart failure, unspecified: Secondary | ICD-10-CM | POA: Diagnosis not present

## 2016-09-11 DIAGNOSIS — M5136 Other intervertebral disc degeneration, lumbar region: Secondary | ICD-10-CM | POA: Diagnosis not present

## 2016-09-11 DIAGNOSIS — I2510 Atherosclerotic heart disease of native coronary artery without angina pectoris: Secondary | ICD-10-CM | POA: Diagnosis not present

## 2016-09-11 DIAGNOSIS — C8598 Non-Hodgkin lymphoma, unspecified, lymph nodes of multiple sites: Secondary | ICD-10-CM | POA: Diagnosis not present

## 2016-09-11 DIAGNOSIS — F329 Major depressive disorder, single episode, unspecified: Secondary | ICD-10-CM | POA: Diagnosis not present

## 2016-09-11 DIAGNOSIS — I110 Hypertensive heart disease with heart failure: Secondary | ICD-10-CM | POA: Diagnosis not present

## 2016-09-14 DIAGNOSIS — F329 Major depressive disorder, single episode, unspecified: Secondary | ICD-10-CM | POA: Diagnosis not present

## 2016-09-14 DIAGNOSIS — I509 Heart failure, unspecified: Secondary | ICD-10-CM | POA: Diagnosis not present

## 2016-09-14 DIAGNOSIS — C8598 Non-Hodgkin lymphoma, unspecified, lymph nodes of multiple sites: Secondary | ICD-10-CM | POA: Diagnosis not present

## 2016-09-14 DIAGNOSIS — I110 Hypertensive heart disease with heart failure: Secondary | ICD-10-CM | POA: Diagnosis not present

## 2016-09-14 DIAGNOSIS — M5136 Other intervertebral disc degeneration, lumbar region: Secondary | ICD-10-CM | POA: Diagnosis not present

## 2016-09-14 DIAGNOSIS — I2510 Atherosclerotic heart disease of native coronary artery without angina pectoris: Secondary | ICD-10-CM | POA: Diagnosis not present

## 2016-09-15 ENCOUNTER — Other Ambulatory Visit: Payer: Self-pay | Admitting: Cardiology

## 2016-09-15 ENCOUNTER — Other Ambulatory Visit: Payer: Self-pay | Admitting: Nurse Practitioner

## 2016-09-15 DIAGNOSIS — N1330 Unspecified hydronephrosis: Secondary | ICD-10-CM | POA: Diagnosis not present

## 2016-09-15 DIAGNOSIS — R3129 Other microscopic hematuria: Secondary | ICD-10-CM | POA: Diagnosis not present

## 2016-09-17 ENCOUNTER — Ambulatory Visit (HOSPITAL_BASED_OUTPATIENT_CLINIC_OR_DEPARTMENT_OTHER): Payer: Medicare Other | Admitting: Oncology

## 2016-09-17 ENCOUNTER — Other Ambulatory Visit (HOSPITAL_BASED_OUTPATIENT_CLINIC_OR_DEPARTMENT_OTHER): Payer: Medicare Other

## 2016-09-17 ENCOUNTER — Telehealth: Payer: Self-pay | Admitting: Oncology

## 2016-09-17 VITALS — BP 152/57 | HR 71 | Temp 97.9°F | Resp 18 | Ht 70.0 in | Wt 190.2 lb

## 2016-09-17 DIAGNOSIS — C8223 Follicular lymphoma grade III, unspecified, intra-abdominal lymph nodes: Secondary | ICD-10-CM

## 2016-09-17 DIAGNOSIS — C8299 Follicular lymphoma, unspecified, extranodal and solid organ sites: Secondary | ICD-10-CM

## 2016-09-17 DIAGNOSIS — D630 Anemia in neoplastic disease: Secondary | ICD-10-CM | POA: Diagnosis not present

## 2016-09-17 DIAGNOSIS — Z86718 Personal history of other venous thrombosis and embolism: Secondary | ICD-10-CM | POA: Diagnosis not present

## 2016-09-17 DIAGNOSIS — Z7189 Other specified counseling: Secondary | ICD-10-CM

## 2016-09-17 LAB — CBC WITH DIFFERENTIAL/PLATELET
BASO%: 0.2 % (ref 0.0–2.0)
BASOS ABS: 0 10*3/uL (ref 0.0–0.1)
EOS%: 2.8 % (ref 0.0–7.0)
Eosinophils Absolute: 0.3 10*3/uL (ref 0.0–0.5)
HEMATOCRIT: 30.9 % — AB (ref 38.4–49.9)
HEMOGLOBIN: 10.2 g/dL — AB (ref 13.0–17.1)
LYMPH#: 2.1 10*3/uL (ref 0.9–3.3)
LYMPH%: 23.3 % (ref 14.0–49.0)
MCH: 29.6 pg (ref 27.2–33.4)
MCHC: 33 g/dL (ref 32.0–36.0)
MCV: 89.6 fL (ref 79.3–98.0)
MONO#: 0.6 10*3/uL (ref 0.1–0.9)
MONO%: 6.5 % (ref 0.0–14.0)
NEUT#: 6 10*3/uL (ref 1.5–6.5)
NEUT%: 67.2 % (ref 39.0–75.0)
NRBC: 0 % (ref 0–0)
Platelets: 151 10*3/uL (ref 140–400)
RBC: 3.45 10*6/uL — ABNORMAL LOW (ref 4.20–5.82)
RDW: 15.3 % — AB (ref 11.0–14.6)
WBC: 8.9 10*3/uL (ref 4.0–10.3)

## 2016-09-17 LAB — COMPREHENSIVE METABOLIC PANEL
ALBUMIN: 3.4 g/dL — AB (ref 3.5–5.0)
ALT: 10 U/L (ref 0–55)
AST: 16 U/L (ref 5–34)
Alkaline Phosphatase: 55 U/L (ref 40–150)
Anion Gap: 6 mEq/L (ref 3–11)
BUN: 19.3 mg/dL (ref 7.0–26.0)
CHLORIDE: 108 meq/L (ref 98–109)
CO2: 25 mEq/L (ref 22–29)
Calcium: 9.6 mg/dL (ref 8.4–10.4)
Creatinine: 1.4 mg/dL — ABNORMAL HIGH (ref 0.7–1.3)
EGFR: 47 mL/min/{1.73_m2} — ABNORMAL LOW (ref >90)
GLUCOSE: 101 mg/dL (ref 70–140)
POTASSIUM: 4.5 meq/L (ref 3.5–5.1)
SODIUM: 140 meq/L (ref 136–145)
Total Bilirubin: 0.45 mg/dL (ref 0.20–1.20)
Total Protein: 5.9 g/dL — ABNORMAL LOW (ref 6.4–8.3)

## 2016-09-17 LAB — PROTIME-INR
INR: 1.8 — AB (ref 2.00–3.50)
PROTIME: 21.6 s — AB (ref 10.6–13.4)

## 2016-09-17 NOTE — Telephone Encounter (Signed)
GAVE PATIENT AVS REPORT AND APPOINTMENTS FOR MARCH  °

## 2016-09-17 NOTE — Progress Notes (Signed)
[facility] OFFICE PROGRESS NOTE   Diagnosis: Non-Hodgkin's lymphoma, prostate cancer  INTERVAL HISTORY:   Edward Woodward returns as scheduled. He feels well. He continues physical therapy. He saw Dr. Junious Silk. A restaging CT of the abdomen and pelvis on 09/15/2016 revealed a decrease in retroperitoneal soft tissue and pelvic sidewall soft tissue. Bilateral ureteral stents are in place with near resolution of hydronephrosis. Dr. Junious Silk plans to exchange or remove the stents.  Objective:  Vital signs in last 24 hours:  Blood pressure (!) 152/57, pulse 71, temperature 97.9 F (36.6 C), resp. rate 18, height 5\' 10"  (1.778 m), weight 190 lb 3.2 oz (86.3 kg), SpO2 99 %.    HEENT: Neck without mass Lymphatics: No cervical or supraclavicular nodes Resp: Lungs clear bilaterally Cardio: Regular rate and rhythm GI: No hepatosplenomegaly, no mass, mild tenderness in the right lower quadrant Vascular: Trace edema at the low leg bilaterally Neuro: Alert and oriented, follows commands, ambulates     Lab Results:  Lab Results  Component Value Date   WBC 8.9 09/17/2016   HGB 10.2 (L) 09/17/2016   HCT 30.9 (L) 09/17/2016   MCV 89.6 09/17/2016   PLT 151 09/17/2016   NEUTROABS 6.0 09/17/2016   BUN 19.3, creatinine 1.4, calcium 9.6 PT/INR 1.8  Medications: I have reviewed the patient's current medications.  Assessment/Plan: 1. Non-Hodgkin's lymphoma (follicular grade 3 lymphoma) - status post 6 cycles of Cytoxan/prednisone/rituximab 07/01/2007 through 10/21/2007.   Biopsy of a retroperitoneal mass 05/13/2016 consistent with involvement by low-grade non-Hodgkin's lymphoma  Initiation of weekly Rituxan 06/17/2016; week 4given 07/09/2016.  CT abdomen/pelvis 09/15/2016-improvement in retroperitoneal and pelvic soft tissue 2. CT chest 04/01/2015 with no lymphadenopathy  CT abdomen/pelvis 05/01/2015 with mild progression of abdomen/pelvic lymphadenopathy 3. Prostate  cancer - he has been treated with hormonal therapy, the PSA was stable on 10/15/2014  PSA increased 04/26/2015   Bone scan 05/09/2015 with unchanged increased activity in the thoracic and lumbar spine felt to most likely be degenerative.   Initiation of Abiraterone and prednisone 05/11/2015. Normalization of the PSA on Abiraterone. 4. History of a normocytic anemia secondary to non-Hodgkin's lymphoma, chemotherapy, and renal insufficiency - progressive secondary to GI bleeding, stable 5. History of Mild thrombocytopenia  6. History of bilateral hydronephrosis. Renal ultrasound 09/26/2014 consistent with medical renal disease. No hydronephrosis.  7. Coronary artery disease - status post coronary artery stent placement. 8. History of noncalcified lung nodules. 9. History of severe neutropenia July 2009 - likely related to delayed toxicity from rituximab. 10. Macular degeneration. 11. Right middle lobe density on a CT of the chest 06/18/2010 measuring 2.5 x 1.6 cm with mildly increased metabolic activity (SUV max 2.5) on a PET scan 06/27/2010. The area of increased density was less confluent and appeared smaller on a restaging CT 09/08/2010. Right middle lobe "scarring" with no new or suspicious pulmonary nodule on a CT 08/04/2011. 12. Mild increased metabolic activity associated with several lymph nodes on the PET scan 06/27/2010 - likely related to lymphoma. No lymphadenopathy in the mediastinum or axillary areas on the CT 08/04/2011. 13. Right hip discomfort-he received a steroid injection by his primary physician without improvement. He saw Dr. Collier Salina and there was a question of a lytic process in the right pelvis. A bone scan on 09/21/2012 revealed no evidence of metastatic disease. 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 Renal insufficiency-progressive CT 01/12/2016 confirmed progressive hydronephrosis  Placement of bilateral ureter stents  04/10/2016 15. Left leg DVT and  pulmonary embolism 04/01/2015-on Coumadin 16. Admission 08/23/2015 with severe anemia and GI bleeding  Upper and lower endoscopy 08/26/2015 without a clear source for bleeding identified 17. Leg edema/anasarca-improved with leg wrapping and diuretics 18. Anemia secondary to chronic disease, renal failure, and possibly lymphoma 19. 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   Biopsy retroperitoneal soft tissue 05/13/2016 20. History of elevated calcium-most likely secondary to non-Hodgkin's lymphoma status post pamidronate 06/08/2016, calcitonin 06/11/2016    Disposition:  Edward Woodward appears stable. His clinical status improved following rituximab and a restaging CT 09/15/2016 confirms improvement in the retroperitoneal lymphadenopathy. He will return for initiation of maintenance rituximab on 10/13/2016. The plan is to deliver maintenance rituximab every 3 months.  He continues Coumadin and a coagulation with a goal INR of 1. 5-2. He will hold the Coumadin for 2 days prior to the ureter stent exchange and resumed on the day of the procedure.  The hypercalcemia has resolved following rituximab.  25 minutes were spent with the patient today. The majority of the time was used for counseling and coordination of care.  [name], MD  09/17/2016  4:37 PM

## 2016-09-18 ENCOUNTER — Telehealth: Payer: Self-pay | Admitting: *Deleted

## 2016-09-18 ENCOUNTER — Other Ambulatory Visit: Payer: Self-pay | Admitting: Urology

## 2016-09-18 DIAGNOSIS — F329 Major depressive disorder, single episode, unspecified: Secondary | ICD-10-CM | POA: Diagnosis not present

## 2016-09-18 DIAGNOSIS — I110 Hypertensive heart disease with heart failure: Secondary | ICD-10-CM | POA: Diagnosis not present

## 2016-09-18 DIAGNOSIS — I2510 Atherosclerotic heart disease of native coronary artery without angina pectoris: Secondary | ICD-10-CM | POA: Diagnosis not present

## 2016-09-18 DIAGNOSIS — C8598 Non-Hodgkin lymphoma, unspecified, lymph nodes of multiple sites: Secondary | ICD-10-CM | POA: Diagnosis not present

## 2016-09-18 DIAGNOSIS — N183 Chronic kidney disease, stage 3 (moderate): Secondary | ICD-10-CM | POA: Diagnosis not present

## 2016-09-18 DIAGNOSIS — N139 Obstructive and reflux uropathy, unspecified: Secondary | ICD-10-CM | POA: Diagnosis not present

## 2016-09-18 DIAGNOSIS — M5136 Other intervertebral disc degeneration, lumbar region: Secondary | ICD-10-CM | POA: Diagnosis not present

## 2016-09-18 DIAGNOSIS — C8593 Non-Hodgkin lymphoma, unspecified, intra-abdominal lymph nodes: Secondary | ICD-10-CM | POA: Diagnosis not present

## 2016-09-18 DIAGNOSIS — D631 Anemia in chronic kidney disease: Secondary | ICD-10-CM | POA: Diagnosis not present

## 2016-09-18 DIAGNOSIS — I509 Heart failure, unspecified: Secondary | ICD-10-CM | POA: Diagnosis not present

## 2016-09-18 DIAGNOSIS — I129 Hypertensive chronic kidney disease with stage 1 through stage 4 chronic kidney disease, or unspecified chronic kidney disease: Secondary | ICD-10-CM | POA: Diagnosis not present

## 2016-09-18 NOTE — Telephone Encounter (Signed)
Message from urologist's office: pt is being scheduled for cystoscopy and stent exchange. Need to know how long to hold Coumadin. Reviewed with Dr. Benay Spice: OK to hold Coumadin for 2 days, resume day of procedure. Left message on voicemail for Pam at [facility] with these instructions.

## 2016-09-21 ENCOUNTER — Other Ambulatory Visit: Payer: Self-pay | Admitting: Urology

## 2016-09-21 DIAGNOSIS — M5136 Other intervertebral disc degeneration, lumbar region: Secondary | ICD-10-CM | POA: Diagnosis not present

## 2016-09-21 DIAGNOSIS — I509 Heart failure, unspecified: Secondary | ICD-10-CM | POA: Diagnosis not present

## 2016-09-21 DIAGNOSIS — I110 Hypertensive heart disease with heart failure: Secondary | ICD-10-CM | POA: Diagnosis not present

## 2016-09-21 DIAGNOSIS — C8598 Non-Hodgkin lymphoma, unspecified, lymph nodes of multiple sites: Secondary | ICD-10-CM | POA: Diagnosis not present

## 2016-09-21 DIAGNOSIS — I2510 Atherosclerotic heart disease of native coronary artery without angina pectoris: Secondary | ICD-10-CM | POA: Diagnosis not present

## 2016-09-21 DIAGNOSIS — F329 Major depressive disorder, single episode, unspecified: Secondary | ICD-10-CM | POA: Diagnosis not present

## 2016-09-23 DIAGNOSIS — M5136 Other intervertebral disc degeneration, lumbar region: Secondary | ICD-10-CM | POA: Diagnosis not present

## 2016-09-23 DIAGNOSIS — F329 Major depressive disorder, single episode, unspecified: Secondary | ICD-10-CM | POA: Diagnosis not present

## 2016-09-23 DIAGNOSIS — I2510 Atherosclerotic heart disease of native coronary artery without angina pectoris: Secondary | ICD-10-CM | POA: Diagnosis not present

## 2016-09-23 DIAGNOSIS — I509 Heart failure, unspecified: Secondary | ICD-10-CM | POA: Diagnosis not present

## 2016-09-23 DIAGNOSIS — I110 Hypertensive heart disease with heart failure: Secondary | ICD-10-CM | POA: Diagnosis not present

## 2016-09-23 DIAGNOSIS — C8598 Non-Hodgkin lymphoma, unspecified, lymph nodes of multiple sites: Secondary | ICD-10-CM | POA: Diagnosis not present

## 2016-09-24 DIAGNOSIS — N1330 Unspecified hydronephrosis: Secondary | ICD-10-CM | POA: Diagnosis not present

## 2016-09-28 ENCOUNTER — Other Ambulatory Visit: Payer: Self-pay | Admitting: Oncology

## 2016-10-02 DIAGNOSIS — I2510 Atherosclerotic heart disease of native coronary artery without angina pectoris: Secondary | ICD-10-CM | POA: Diagnosis not present

## 2016-10-02 DIAGNOSIS — C8598 Non-Hodgkin lymphoma, unspecified, lymph nodes of multiple sites: Secondary | ICD-10-CM | POA: Diagnosis not present

## 2016-10-02 DIAGNOSIS — I110 Hypertensive heart disease with heart failure: Secondary | ICD-10-CM | POA: Diagnosis not present

## 2016-10-02 DIAGNOSIS — F329 Major depressive disorder, single episode, unspecified: Secondary | ICD-10-CM | POA: Diagnosis not present

## 2016-10-02 DIAGNOSIS — M5136 Other intervertebral disc degeneration, lumbar region: Secondary | ICD-10-CM | POA: Diagnosis not present

## 2016-10-02 DIAGNOSIS — I509 Heart failure, unspecified: Secondary | ICD-10-CM | POA: Diagnosis not present

## 2016-10-05 ENCOUNTER — Encounter (HOSPITAL_BASED_OUTPATIENT_CLINIC_OR_DEPARTMENT_OTHER): Payer: Self-pay | Admitting: *Deleted

## 2016-10-05 NOTE — Progress Notes (Signed)
REVIEWED PT'S CHART INCLUDING CARDIOLOGIST NOTE, CARDIAC CATH REPORT'S, NEPHROLOGIST NOTE, AND ANESTHESIA NOTE FROM 04-10-2016 SURGERY AND ASA IV.  REVIEWED CHART W/ DR CARSWALL JACKSON MDA,  STATED PT SHOULD BE DONE AT MAIN OR.  CALLED AND SPOKE VIA PHONE W/ PAM, [place].

## 2016-10-07 ENCOUNTER — Encounter (HOSPITAL_COMMUNITY)
Admission: RE | Admit: 2016-10-07 | Discharge: 2016-10-07 | Disposition: A | Discharge status: Home / Self Care | Payer: Medicare Other | Source: Ambulatory Visit | Attending: Urology | Admitting: Urology

## 2016-10-07 ENCOUNTER — Encounter (HOSPITAL_COMMUNITY): Payer: Self-pay

## 2016-10-07 DIAGNOSIS — G4730 Sleep apnea, unspecified: Secondary | ICD-10-CM | POA: Diagnosis not present

## 2016-10-07 DIAGNOSIS — N1330 Unspecified hydronephrosis: Secondary | ICD-10-CM | POA: Diagnosis present

## 2016-10-07 DIAGNOSIS — Z87891 Personal history of nicotine dependence: Secondary | ICD-10-CM | POA: Diagnosis not present

## 2016-10-07 DIAGNOSIS — Y838 Other surgical procedures as the cause of abnormal reaction of the patient, or of later complication, without mention of misadventure at the time of the procedure: Secondary | ICD-10-CM | POA: Diagnosis not present

## 2016-10-07 DIAGNOSIS — Z955 Presence of coronary angioplasty implant and graft: Secondary | ICD-10-CM | POA: Diagnosis not present

## 2016-10-07 DIAGNOSIS — M5136 Other intervertebral disc degeneration, lumbar region: Secondary | ICD-10-CM | POA: Diagnosis not present

## 2016-10-07 DIAGNOSIS — Z7901 Long term (current) use of anticoagulants: Secondary | ICD-10-CM | POA: Diagnosis not present

## 2016-10-07 DIAGNOSIS — I10 Essential (primary) hypertension: Secondary | ICD-10-CM | POA: Diagnosis not present

## 2016-10-07 DIAGNOSIS — E7800 Pure hypercholesterolemia, unspecified: Secondary | ICD-10-CM | POA: Diagnosis not present

## 2016-10-07 DIAGNOSIS — I252 Old myocardial infarction: Secondary | ICD-10-CM | POA: Diagnosis not present

## 2016-10-07 DIAGNOSIS — N135 Crossing vessel and stricture of ureter without hydronephrosis: Secondary | ICD-10-CM | POA: Diagnosis not present

## 2016-10-07 DIAGNOSIS — Z01812 Encounter for preprocedural laboratory examination: Secondary | ICD-10-CM | POA: Insufficient documentation

## 2016-10-07 DIAGNOSIS — Z79899 Other long term (current) drug therapy: Secondary | ICD-10-CM | POA: Diagnosis not present

## 2016-10-07 DIAGNOSIS — T83192A Other mechanical complication of urinary stent, initial encounter: Secondary | ICD-10-CM | POA: Diagnosis not present

## 2016-10-07 DIAGNOSIS — Z950 Presence of cardiac pacemaker: Secondary | ICD-10-CM | POA: Diagnosis not present

## 2016-10-07 DIAGNOSIS — I110 Hypertensive heart disease with heart failure: Secondary | ICD-10-CM | POA: Diagnosis not present

## 2016-10-07 DIAGNOSIS — K219 Gastro-esophageal reflux disease without esophagitis: Secondary | ICD-10-CM | POA: Diagnosis not present

## 2016-10-07 DIAGNOSIS — I2510 Atherosclerotic heart disease of native coronary artery without angina pectoris: Secondary | ICD-10-CM | POA: Diagnosis not present

## 2016-10-07 DIAGNOSIS — Z8572 Personal history of non-Hodgkin lymphomas: Secondary | ICD-10-CM | POA: Diagnosis not present

## 2016-10-07 DIAGNOSIS — I509 Heart failure, unspecified: Secondary | ICD-10-CM | POA: Diagnosis not present

## 2016-10-07 DIAGNOSIS — F329 Major depressive disorder, single episode, unspecified: Secondary | ICD-10-CM | POA: Diagnosis not present

## 2016-10-07 DIAGNOSIS — C8598 Non-Hodgkin lymphoma, unspecified, lymph nodes of multiple sites: Secondary | ICD-10-CM | POA: Diagnosis not present

## 2016-10-07 LAB — BASIC METABOLIC PANEL
Anion gap: 5 (ref 5–15)
BUN: 27 mg/dL — AB (ref 6–20)
CO2: 27 mmol/L (ref 22–32)
CREATININE: 1.46 mg/dL — AB (ref 0.61–1.24)
Calcium: 9.2 mg/dL (ref 8.9–10.3)
Chloride: 107 mmol/L (ref 101–111)
GFR, EST AFRICAN AMERICAN: 48 mL/min — AB (ref >60)
GFR, EST NON AFRICAN AMERICAN: 41 mL/min — AB (ref >60)
Glucose, Bld: 98 mg/dL (ref 65–99)
Potassium: 4.3 mmol/L (ref 3.5–5.1)
SODIUM: 139 mmol/L (ref 135–145)

## 2016-10-07 LAB — PROTIME-INR
INR: 1.14
PROTHROMBIN TIME: 14.6 s (ref 11.4–15.2)

## 2016-10-07 NOTE — Patient Instructions (Addendum)
Edward Woodward  10/07/2016   Your procedure is scheduled on: Friday 10/09/2016  Report to [facility] Main  Entrance take [place]  elevators to 3rd floor to  [place] at  [place] AM.  Call this number if you have problems the morning of surgery [phone]   Remember: ONLY 1 PERSON MAY GO WITH YOU TO SHORT STAY TO GET  READY MORNING OF [place].   Do not eat food or drink liquids :After Midnight.    Bring your CPAP mask and tubing morning of surgery!   Take these medicines the morning of surgery with A SIP OF WATER: Metoprolol, Clonazepam (Klonopin), Abiraterone Acetate (Zytiga), Metoprolol (Toprol-XL), Prednisone                                You may not have any metal on your body including hair pins and              piercings  Do not wear jewelry, make-up, lotions, powders or perfumes, deodorant             Do not wear nail polish.  Do not shave  48 hours prior to surgery.              Men may shave face and neck.   Do not bring valuables to the hospital. [place].  Contacts, dentures or bridgework may not be worn into surgery.  Leave suitcase in the car. After surgery it may be brought to your room.     Patients discharged the day of surgery will not be allowed to drive home.  Name and phone number of your driver:spouse- [facility]  Special Instructions: N/A              Please read over the following fact sheets you were given: _____________________________________________________________________             [facility] - Preparing for Surgery Before surgery, you can play an important role.  Because skin is not sterile, your skin needs to be as free of germs as possible.  You can reduce the number of germs on your skin by washing with CHG (chlorahexidine gluconate) soap before surgery.  CHG is an antiseptic cleaner which kills germs and bonds with the skin to continue killing germs even after  washing. Please DO NOT use if you have an allergy to CHG or antibacterial soaps.  If your skin becomes reddened/irritated stop using the CHG and inform your nurse when you arrive at Short Stay. Do not shave (including legs and underarms) for at least 48 hours prior to the first CHG shower.  You may shave your face/neck. Please follow these instructions carefully:  1.  Shower with CHG Soap the night before surgery and the  morning of Surgery.  2.  If you choose to wash your hair, wash your hair first as usual with your  normal  shampoo.  3.  After you shampoo, rinse your hair and body thoroughly to remove the  shampoo.                           4.  Use CHG as you would any other liquid soap.  You can apply chg directly  to the skin and wash                       Gently with a scrungie or clean washcloth.  5.  Apply the CHG Soap to your body ONLY FROM THE NECK DOWN.   Do not use on face/ open                           Wound or open sores. Avoid contact with eyes, ears mouth and genitals (private parts).                       Wash face,  Genitals (private parts) with your normal soap.             6.  Wash thoroughly, paying special attention to the area where your surgery  will be performed.  7.  Thoroughly rinse your body with warm water from the neck down.  8.  DO NOT shower/wash with your normal soap after using and rinsing off  the CHG Soap.                9.  Pat yourself dry with a clean towel.            10.  Wear clean pajamas.            11.  Place clean sheets on your bed the night of your first shower and do not  sleep with pets. Day of Surgery : Do not apply any lotions/deodorants the morning of surgery.  Please wear clean clothes to the hospital/surgery center.  FAILURE TO FOLLOW THESE INSTRUCTIONS MAY RESULT IN THE CANCELLATION OF YOUR SURGERY PATIENT SIGNATURE_________________________________  NURSE  SIGNATURE__________________________________  ________________________________________________________________________

## 2016-10-07 NOTE — Progress Notes (Signed)
Patient has a skin tear on right forearm with bandage covering it. No drainage noted.

## 2016-10-08 ENCOUNTER — Other Ambulatory Visit: Payer: Self-pay | Admitting: Oncology

## 2016-10-08 NOTE — H&P (Signed)
Office Visit Report     09/09/2016    Edward Woodward. Keefe         MRN: [id number]  PRIMARY CARE:  Hal T. Felipa Eth, MD  DOB: [birth year], 83 year old Male  REFERRING:  Ammie Dalton, NP  SSN: [id number]  PROVIDER:  Festus Aloe, M.D.    LOCATION:  [facility] [phone]    CC: I have hydronephrosis.  HPI: Edward Woodward is a 83 year-old male established patient who is here for hydronephrosis.  His problem was diagnosed 01/12/2016. The problem is on both sides. He had the following x-rays done: CT Scan.   He has had a stent placed in his kidney.   Patient with a history of lymphoma. He developed a retroperitoneal process that caused bilateral hydronephrosis. His creatinine rose to 2.25 and he underwent bilateral ureteral stents September 2017. He had a retroperitoneal biopsy October 2017 which was consistent with lymphoma. He recently completed rituximab therapy. I discussed the patient with Dr. Benay Spice who reports the patient had appropriate response. So much so Dr. Benay Spice thought it might be worth restaging to consider if the patient can have the stents removed.   The patient underwent a plain x-ray July 15, 2016 which showed the stent in good position. They did not appear encrusted.   CC/HPI: I have prostate cancer (treatment).   The patient was under intermittent androgen deprivation for several years. His PSA rose and Dr. Benay Spice took over the androgen deprivation and then added [facility] October 2016. The patient's done quite well since that time.   ALLERGIES: Penicillins Simvastatin TABS   MEDICATIONS: Warfarin Sodium 5 mg tablet  Atorvastatin Calcium 40 mg tablet  Hydrocodone-Acetaminophen 5 mg-325 mg tablet 1 tablet PO Q 4 H PRN  Isosorbide Dinitrate 30 mg tablet  Klonopin 1 mg tablet  Metolazone 2.5 mg tablet  Metoprolol Succinate Er-Hctz 50 mg-12.5 mg tablet, extended release 24 hr  Multi-Day Vitamins  Pantoprazole Sodium 40 mg tablet, delayed release    Polyethylene Glycol  Vitamin B 12  Vitamin D3    GU PSH: Cystoscopy Insert Stent, Bilateral - 04/10/2016     PSH Notes: Cataract Surgery, Cath Stent Placement, Rotator Cuff Repair   NON-GU PSH: No Non-GU PSH    GU PMH: Gross hematuria - 07/15/2016 Obstructive and reflux uropathy, Unspec - 04/24/2016 Prostate Cancer - 04/24/2016, Prostate cancer, - 2014     PMH Notes:  1898-07-27 00:00:00 - Note: Normal Routine History And Physical Senior Citizen [phone]  2006-09-07 10:51:40 - Note: Coronary Artery Disease  2006-09-07 10:51:40 - Note: Arthritis   NON-GU PMH: Follicular lymphoma grade IIIa, intra-abdominal lymph nodes - 04/24/2016 Cardiac murmur, unspecified, Murmurs - 2014 Personal history of other diseases of the circulatory system, History of hypertension - 2014 Personal history of other diseases of the digestive system, History of esophageal reflux - 2014 Personal history of other diseases of the nervous system and sense organs, History of sleep apnea - 2014 Personal history of other endocrine, nutritional and metabolic disease, History of hypercholesterolemia - 2014   FAMILY HISTORY: Cancer - Mother Prostate Cancer - Son   SOCIAL HISTORY: Marital Status: Married Current Smoking Status: Patient does not smoke anymore. Has not smoked since 03/27/1961.  Does not drink anymore.  Drinks 1 caffeinated drink per day.    Notes: Death In The Family Mother, Tobacco Use, Death In The Family Father, Caffeine Use, Occupation:, Alcohol Use, Marital History - Currently Married   REVIEW OF SYSTEMS:  GU Review Male:   Patient denies frequent urination, hard to postpone urination, burning/ pain with urination, get up at night to urinate, leakage of urine, stream starts and stops, trouble starting your stream, have to strain to urinate , erection problems, and penile pain.  Gastrointestinal (Upper):   Patient denies nausea, vomiting, and indigestion/ heartburn.  Gastrointestinal (Lower):    Patient denies diarrhea and constipation.  Constitutional:   Patient denies fever, night sweats, weight loss, and fatigue.  Skin:   Patient denies skin rash/ lesion and itching.  Eyes:   Patient denies blurred vision and double vision.  Ears/ Nose/ Throat:   Patient denies sore throat and sinus problems.  Hematologic/Lymphatic:   Patient denies swollen glands and easy bruising.  Cardiovascular:   Patient denies leg swelling and chest pains.  Respiratory:   Patient denies cough and shortness of breath.  Endocrine:   Patient denies excessive thirst.  Musculoskeletal:   Patient denies back pain and joint pain.  Neurological:   Patient denies headaches and dizziness.  Psychologic:   Patient denies depression and anxiety.   VITAL SIGNS:      09/09/2016 03:36 PM  Weight 181 lb / 82.1 kg  Height 70 in / 177.8 cm  BP 117/61 mmHg  Heart Rate 77 /min  BMI 26.0 kg/m   MULTI-SYSTEM PHYSICAL EXAMINATION:    Constitutional: Well-nourished. No physical deformities. Normally developed. Good grooming.  Neck: Neck symmetrical, not swollen. Normal tracheal position.  Respiratory: No labored breathing, no use of accessory muscles.   Cardiovascular: Normal temperature, normal extremity pulses, no swelling, no varicosities.  Skin: No paleness, no jaundice, no cyanosis. No lesion, no ulcer, no rash.  Neurologic / Psychiatric: Oriented to time, oriented to place, oriented to person. No depression, no anxiety, no agitation.    PAST DATA REVIEWED:  Source Of History:  Patient  X-Ray Review: KUB: Reviewed Films.  C.T. Abdomen/Pelvis: Reviewed Films.    01/10/10 11/05/09 08/22/09 05/13/09 03/26/09 02/18/09 11/09/08 09/07/08  PSA  Total PSA 1.82  2.49  3.69  2.76  2.63  7.98  1.67  1.64    PROCEDURES:         Urinalysis w/Scope Dipstick Dipstick Cont'd Micro  Color: Yellow Bilirubin: Neg WBC/hpf: 0 - 5/hpf  Appearance: Cloudy Ketones: Neg RBC/hpf: 40 - 60/hpf  Specific Gravity: 1.020 Blood: 3+  Bacteria: Rare (0-9/hpf)  pH: 5.5 Protein: 1+ Cystals: Amorph Urates  Glucose: Neg Urobilinogen: 0.2 Casts: NS (Not Seen)    Nitrites: Neg Trichomonas: Not Present    Leukocyte Esterase: Neg Mucous: Present      Epithelial Cells: 0 - 5/hpf      Yeast: NS (Not Seen)      Sperm: Not Present   ASSESSMENT:      ICD-10 Details  1 GU:   Hydronephrosis Unspec - N13.30   2   Prostate Cancer - C61    PLAN:          Orders Labs BUN/Creatinine  X-Ray Notes: 08/2016 History:Hydronephrosis  Hematuria: No  Patient to see MD after exam: No  Previous exam: CT   When:06  Where:[place]  Diabetic: Yes/ No  BUN/ Creatinine:  Date of last BUN Creatinine:09/09/16  Weight in pounds:181  Allergy- IV Contrast: No  Conflicting diabetic meds: No  Diabetic Meds:  Prior Authorization #: [id number]         Schedule X-Rays: 1 Week - C.T. Abdomen/Pelvis With I.V. Contrast - pt may need REDUCED dose   Return Visit/Planned Activity: Return  PRN - Office Visit, Follow up MD         Document Letter(s):  Created for Patient: Clinical Summary        Notes:   hydro - I sent urine for cx. We'll send a bun. CR and consider contrast vs non-con CT and then simple stent removal vs cysto/RGP/stent removal or exchange in OR.   cc: Dr. Benay Spice   * Signed by Festus Aloe, M.D. on 09/10/16 at 12:24 PM (EST)*     The information contained in this medical record document is considered private and confidential patient information. This information can only be used for the medical diagnosis and/or medical services that are being provided by the patient's selected caregivers. This information can only be distributed outside of the patient's care if the patient agrees and signs waivers of authorization for this information to be sent to an outside source or route.  Add: Urine Cx was negative

## 2016-10-09 ENCOUNTER — Other Ambulatory Visit: Payer: Self-pay | Admitting: Urology

## 2016-10-09 ENCOUNTER — Encounter (HOSPITAL_COMMUNITY): Payer: Self-pay | Admitting: Urology

## 2016-10-09 ENCOUNTER — Encounter (HOSPITAL_COMMUNITY)
Admission: RE | Disposition: A | Discharge status: Home / Self Care | Payer: Self-pay | Source: Ambulatory Visit | Attending: Urology

## 2016-10-09 ENCOUNTER — Ambulatory Visit (HOSPITAL_COMMUNITY): Payer: Medicare Other | Admitting: Anesthesiology

## 2016-10-09 ENCOUNTER — Ambulatory Visit (HOSPITAL_BASED_OUTPATIENT_CLINIC_OR_DEPARTMENT_OTHER)
Admission: RE | Admit: 2016-10-09 | Discharge: 2016-10-09 | Disposition: A | Discharge status: Home / Self Care | Payer: Medicare Other | Source: Ambulatory Visit | Attending: Urology | Admitting: Urology

## 2016-10-09 DIAGNOSIS — E7800 Pure hypercholesterolemia, unspecified: Secondary | ICD-10-CM | POA: Insufficient documentation

## 2016-10-09 DIAGNOSIS — Z7901 Long term (current) use of anticoagulants: Secondary | ICD-10-CM | POA: Insufficient documentation

## 2016-10-09 DIAGNOSIS — Z8572 Personal history of non-Hodgkin lymphomas: Secondary | ICD-10-CM | POA: Diagnosis not present

## 2016-10-09 DIAGNOSIS — Z79899 Other long term (current) drug therapy: Secondary | ICD-10-CM | POA: Insufficient documentation

## 2016-10-09 DIAGNOSIS — I252 Old myocardial infarction: Secondary | ICD-10-CM | POA: Diagnosis not present

## 2016-10-09 DIAGNOSIS — I2510 Atherosclerotic heart disease of native coronary artery without angina pectoris: Secondary | ICD-10-CM | POA: Insufficient documentation

## 2016-10-09 DIAGNOSIS — N135 Crossing vessel and stricture of ureter without hydronephrosis: Secondary | ICD-10-CM | POA: Insufficient documentation

## 2016-10-09 DIAGNOSIS — Y838 Other surgical procedures as the cause of abnormal reaction of the patient, or of later complication, without mention of misadventure at the time of the procedure: Secondary | ICD-10-CM | POA: Insufficient documentation

## 2016-10-09 DIAGNOSIS — Z87891 Personal history of nicotine dependence: Secondary | ICD-10-CM | POA: Insufficient documentation

## 2016-10-09 DIAGNOSIS — I10 Essential (primary) hypertension: Secondary | ICD-10-CM | POA: Diagnosis not present

## 2016-10-09 DIAGNOSIS — T83192A Other mechanical complication of urinary stent, initial encounter: Secondary | ICD-10-CM | POA: Insufficient documentation

## 2016-10-09 DIAGNOSIS — Z466 Encounter for fitting and adjustment of urinary device: Secondary | ICD-10-CM | POA: Diagnosis not present

## 2016-10-09 DIAGNOSIS — G4730 Sleep apnea, unspecified: Secondary | ICD-10-CM | POA: Diagnosis not present

## 2016-10-09 DIAGNOSIS — N1330 Unspecified hydronephrosis: Secondary | ICD-10-CM

## 2016-10-09 DIAGNOSIS — I5032 Chronic diastolic (congestive) heart failure: Secondary | ICD-10-CM | POA: Diagnosis not present

## 2016-10-09 DIAGNOSIS — Q6211 Congenital occlusion of ureteropelvic junction: Secondary | ICD-10-CM

## 2016-10-09 DIAGNOSIS — Z950 Presence of cardiac pacemaker: Secondary | ICD-10-CM | POA: Insufficient documentation

## 2016-10-09 DIAGNOSIS — I130 Hypertensive heart and chronic kidney disease with heart failure and stage 1 through stage 4 chronic kidney disease, or unspecified chronic kidney disease: Secondary | ICD-10-CM | POA: Diagnosis not present

## 2016-10-09 DIAGNOSIS — K219 Gastro-esophageal reflux disease without esophagitis: Secondary | ICD-10-CM | POA: Insufficient documentation

## 2016-10-09 DIAGNOSIS — Z955 Presence of coronary angioplasty implant and graft: Secondary | ICD-10-CM | POA: Insufficient documentation

## 2016-10-09 HISTORY — PX: CYSTOSCOPY W/ URETERAL STENT PLACEMENT: SHX1429

## 2016-10-09 SURGERY — CYSTOSCOPY, WITH RETROGRADE PYELOGRAM AND URETERAL STENT INSERTION
Anesthesia: Monitor Anesthesia Care | Site: Ureter | Laterality: Bilateral

## 2016-10-09 MED ORDER — 0.9 % SODIUM CHLORIDE (POUR BTL) OPTIME
TOPICAL | Status: DC | PRN
  Administered 2016-10-09: 1000 mL

## 2016-10-09 MED ORDER — HYDROMORPHONE HCL 1 MG/ML IJ SOLN: 0.2500 mg | INTRAMUSCULAR | Status: DC | PRN

## 2016-10-09 MED ORDER — FUROSEMIDE 10 MG/ML IJ SOLN
20.0000 mg | Freq: Once | INTRAMUSCULAR | Status: DC
  Filled 2016-10-09: qty 2

## 2016-10-09 MED ORDER — PROPOFOL 10 MG/ML IV BOLUS
INTRAVENOUS | Status: AC
  Filled 2016-10-09: qty 20

## 2016-10-09 MED ORDER — LIDOCAINE HCL 2 % EX GEL
CUTANEOUS | Status: AC
  Filled 2016-10-09: qty 5

## 2016-10-09 MED ORDER — FUROSEMIDE 10 MG/ML IJ SOLN
INTRAMUSCULAR | Status: DC | PRN
  Administered 2016-10-09: 20 mg via INTRAMUSCULAR

## 2016-10-09 MED ORDER — IOHEXOL 300 MG/ML  SOLN
INTRAMUSCULAR | Status: DC | PRN
  Administered 2016-10-09: 50 mL via INTRAVENOUS

## 2016-10-09 MED ORDER — LIDOCAINE 2% (20 MG/ML) 5 ML SYRINGE
INTRAMUSCULAR | Status: DC | PRN
  Administered 2016-10-09: 50 mg via INTRAVENOUS

## 2016-10-09 MED ORDER — LACTATED RINGERS IV SOLN
INTRAVENOUS | Status: DC
  Administered 2016-10-09: 09:00:00 via INTRAVENOUS

## 2016-10-09 MED ORDER — CIPROFLOXACIN HCL 500 MG PO TABS: 500.0000 mg | ORAL_TABLET | Freq: Every day | ORAL | 0 refills | Status: DC

## 2016-10-09 MED ORDER — CEFAZOLIN SODIUM-DEXTROSE 2-4 GM/100ML-% IV SOLN: 2.0000 g | Freq: Once | INTRAVENOUS | Status: DC

## 2016-10-09 MED ORDER — SODIUM CHLORIDE 0.9 % IR SOLN
Status: DC | PRN
  Administered 2016-10-09: 4000 mL

## 2016-10-09 MED ORDER — PROPOFOL 10 MG/ML IV BOLUS
INTRAVENOUS | Status: AC
  Filled 2016-10-09: qty 40

## 2016-10-09 MED ORDER — ONDANSETRON HCL 4 MG/2ML IJ SOLN: 4.0000 mg | Freq: Once | INTRAMUSCULAR | Status: DC | PRN

## 2016-10-09 MED ORDER — TRAMADOL HCL 50 MG PO TABS: 50.0000 mg | ORAL_TABLET | Freq: Four times a day (QID) | ORAL | 0 refills | Status: DC | PRN

## 2016-10-09 MED ORDER — CEFAZOLIN SODIUM-DEXTROSE 2-4 GM/100ML-% IV SOLN
INTRAVENOUS | Status: AC
  Filled 2016-10-09: qty 100

## 2016-10-09 MED ORDER — LEVOFLOXACIN IN D5W 750 MG/150ML IV SOLN
750.0000 mg | INTRAVENOUS | Status: AC
  Administered 2016-10-09: 750 mg via INTRAVENOUS
  Filled 2016-10-09: qty 150

## 2016-10-09 MED ORDER — LIDOCAINE 2% (20 MG/ML) 5 ML SYRINGE
INTRAMUSCULAR | Status: AC
  Filled 2016-10-09: qty 5

## 2016-10-09 MED ORDER — MEPERIDINE HCL 50 MG/ML IJ SOLN: 6.2500 mg | INTRAMUSCULAR | Status: DC | PRN

## 2016-10-09 MED ORDER — PROPOFOL 500 MG/50ML IV EMUL
INTRAVENOUS | Status: DC | PRN
  Administered 2016-10-09: 200 ug/kg/min via INTRAVENOUS

## 2016-10-09 SURGICAL SUPPLY — 17 items
BAG URO CATCHER STRL LF (MISCELLANEOUS) ×3 IMPLANT
BASKET ZERO TIP NITINOL 2.4FR (BASKET) IMPLANT
BSKT STON RTRVL ZERO TP 2.4FR (BASKET)
CATH INTERMIT  6FR 70CM (CATHETERS) ×2 IMPLANT
CATH URET 5FR 28IN CONE TIP (BALLOONS) ×2
CATH URET 5FR 70CM CONE TIP (BALLOONS) IMPLANT
CLOTH BEACON ORANGE TIMEOUT ST (SAFETY) ×3 IMPLANT
GLOVE BIOGEL M STRL SZ7.5 (GLOVE) ×3 IMPLANT
GOWN STRL REUS W/TWL LRG LVL3 (GOWN DISPOSABLE) ×3 IMPLANT
GOWN STRL REUS W/TWL XL LVL3 (GOWN DISPOSABLE) ×3 IMPLANT
GUIDEWIRE ANG ZIPWIRE 038X150 (WIRE) ×2 IMPLANT
GUIDEWIRE STR DUAL SENSOR (WIRE) ×1 IMPLANT
MANIFOLD NEPTUNE II (INSTRUMENTS) ×3 IMPLANT
PACK CYSTO (CUSTOM PROCEDURE TRAY) ×3 IMPLANT
SYRINGE IRR TOOMEY STRL 70CC (SYRINGE) ×2 IMPLANT
TUBING CONNECTING 10 (TUBING) ×2 IMPLANT
TUBING CONNECTING 10' (TUBING) ×1

## 2016-10-09 NOTE — Op Note (Signed)
Preoperative diagnosis: Bilateral hydronephrosis, lymphoma Postoperative diagnosis: Bilateral UPJ obstruction  Procedure: Cystoscopy, bilateral ureteral stent removal, bilateral retrograde pyelogram  Surgeon: Junious Silk  Anesthesia: Gen.  Indications for procedure: 83 year old with history of lymphoma who developed progressive hydronephrosis last fall. Creatinine went from baseline 1.45 up to 2.4 and he underwent ureteral stent placement. He was then treated for his lymphoma is substantial decrease in retroperitoneal mass. He was brought today for trial without stents versus possible stent exchange.  Findings: On cystoscopy the urethra appeared normal, the prostate was nonobstructive. The bladder was moderately trabeculated. There were no stones in the bladder. 2 ureteral stents are present which were mildly encrusted right greater than left.   On retrograde right greater than left UPJ obstruction. Left greater than right washout over 10-15 minutes.  Left retrograde pyelogram-this outlined a single ureter single collecting system unit. There is no filling defect stricture or dilation of the ureter but there was narrowing of the UPJ with dilation of the collecting system and renal pelvis. There was brisk washout of the ureter and after 10 minutes Lasix IV challenge there was excellent clear efflux noticeable decrease in contrast in the collecting system.  Right retrograde pyelogram-this outlined a single ureter single collecting system unit. There was no filling defect, stricture or dilation of the ureter but again narrowing of the UPJ and dilation of the collecting system and renal pelvis. Again the ureter washed out rather quickly and after 10 minutes Lasix IV challenge there was good efflux of clear urine and noticeable decrease in contrast in the collecting system.   Description of procedure: After consent was obtained patient brought to the operating room. After adequate anesthesia he was placed  in lithotomy position and prepped and draped in the usual sterile fashion. A timeout was performed to confirm the patient and procedure. The cystoscope was passed per urethra and the left ureteral stent grasped and removed without difficulty. I then grasped right ureteral stent and it was harder to pull. It was more encrusted in the left. Therefore I left the stent in place and passed a Glidewire adjacent to it to secure the ureter and then remove the stent.   I then used a cone-tipped catheter to inject contrast in the left ureteral orifice and perform a retrograde. I then passed a 6 [place] open-ended catheter over the right Glidewire into the right collecting system and removed the wire. Urine was obtained and then retrograde injection of contrast was performed and I pulled the catheter out and did a retrograde as the 6 [place] open-ended catheter was withdrawn. He was given 20 mg of IV Lasix. There was excellent efflux from the left in good efflux from the right. Both were clear. There was notable decrease in contrast in the collecting systems and therefore I decided to give him a trial without stents. Some stent debris was irrigated from the bladder the bladder drained and the scope removed. I put some lidocaine jelly per urethra. He was awakened taken to the recovery room in stable patient.   Complications: None   Blood loss: Minimal   Specimens: None   Drains: None   Disposition: Patient stable to PACU. I'll plan to get a MAG3 scan next available as a baseline with a BUN and creatinine in 2-3 weeks.

## 2016-10-09 NOTE — Interval H&P Note (Signed)
History and Physical Interval Note:  10/09/2016 10:31 AM  Edward Woodward  has presented today for surgery, with the diagnosis of hydronephrosis  The various methods of treatment have been discussed with the patient and family. After consideration of risks, benefits and other options for treatment, the patient has consented to  Procedure(s): CYSTOSCOPY WITH RETROGRADE PYELOGRAM/URETERAL STENT REMOVAL / EXCHANGE BILATERAL (Bilateral) as a surgical intervention .  The patient's history has been reviewed, patient examined, no change in status, stable for surgery.  He's had no gross hematuria or dysuria. He was "incontinent", but that has improved as well with stents. Discussed possible stent removal vs exchange depending on findings. I have reviewed the patient's chart and labs.  Questions were answered to the patient's satisfaction.     Joeanna Howdyshell

## 2016-10-09 NOTE — Anesthesia Procedure Notes (Deleted)
Procedure Name: MAC Date/Time: 10/09/2016 10:39 AM Performed by: Dione Booze Pre-anesthesia Checklist: Patient identified, Emergency Drugs available, Suction available and Patient being monitored Patient Re-evaluated:Patient Re-evaluated prior to inductionOxygen Delivery Method: Simple face mask Placement Confirmation: positive ETCO2 Dental Injury: Teeth and Oropharynx as per pre-operative assessment

## 2016-10-09 NOTE — Anesthesia Preprocedure Evaluation (Signed)
Anesthesia Evaluation  Patient identified by MRN, date of birth, ID band Patient awake    Reviewed: Allergy & Precautions, NPO status , Patient's Chart, lab work & pertinent test results  Airway Mallampati: I  TM Distance: >3 FB Neck ROM: Full    Dental   Pulmonary sleep apnea , former smoker,    Pulmonary exam normal        Cardiovascular hypertension, + CAD, + Past MI and + Cardiac Stents  Normal cardiovascular exam+ pacemaker      Neuro/Psych    GI/Hepatic GERD  Medicated and Controlled,  Endo/Other    Renal/GU Renal InsufficiencyRenal disease     Musculoskeletal   Abdominal   Peds  Hematology   Anesthesia Other Findings   Reproductive/Obstetrics                             Anesthesia Physical Anesthesia Plan  ASA: III  Anesthesia Plan: MAC   Post-op Pain Management:    Induction: Intravenous  Airway Management Planned: Simple Face Mask  Additional Equipment:   Intra-op Plan:   Post-operative Plan:   Informed Consent: I have reviewed the patients History and Physical, chart, labs and discussed the procedure including the risks, benefits and alternatives for the proposed anesthesia with the patient or authorized representative who has indicated his/her understanding and acceptance.     Plan Discussed with: CRNA and Surgeon  Anesthesia Plan Comments:         Anesthesia Quick Evaluation

## 2016-10-09 NOTE — Discharge Instructions (Signed)
Cystoscopy, Care After Refer to this sheet in the next few weeks. These instructions provide you with information about caring for yourself after your procedure. Your health care provider may also give you more specific instructions. Your treatment has been planned according to current medical practices, but problems sometimes occur. Call your health care provider if you have any problems or questions after your procedure. What can I expect after the procedure? After the procedure, it is common to have:  Mild pain when you urinate. Pain should stop within a few minutes after you urinate. This may last for up to 1 week.  A small amount of blood in your urine for several days.  Feeling like you need to urinate but producing only a small amount of urine. Follow these instructions at home:   Medicines   Take over-the-counter and prescription medicines only as told by your health care provider.  If you were prescribed an antibiotic medicine, take it as told by your health care provider. Do not stop taking the antibiotic even if you start to feel better. General instructions    Return to your normal activities as told by your health care provider. Ask your health care provider what activities are safe for you.  Do not drive for 24 hours if you received a sedative.  Watch for any blood in your urine. If the amount of blood in your urine increases, call your health care provider.  Follow instructions from your health care provider about eating or drinking restrictions.  If a tissue sample was removed for testing (biopsy) during your procedure, it is your responsibility to get your test results. Ask your health care provider or the department performing the test when your results will be ready.  Drink enough fluid to keep your urine clear or pale yellow.  Keep all follow-up visits as told by your health care provider. This is important. Contact a health care provider if:  You have pain that  gets worse or does not get better with medicine, especially pain when you urinate.  You have difficulty urinating. Get help right away if:  You have more blood in your urine.  You have blood clots in your urine.  You have abdominal pain.  You have a fever or chills.  You are unable to urinate. This information is not intended to replace advice given to you by your health care provider. Make sure you discuss any questions you have with your health care provider. Document Released: 01/30/2005 Document Revised: 12/19/2015 Document Reviewed: 05/30/2015 Elsevier Interactive Patient Education  2017 [place], Care After These instructions provide you with information about caring for yourself after your procedure. Your health care provider may also give you more specific instructions. Your treatment has been planned according to current medical practices, but problems sometimes occur. Call your health care provider if you have any problems or questions after your procedure. What can I expect after the procedure? After your procedure, it is common to:  Feel sleepy for several hours.  Feel clumsy and have poor balance for several hours.  Feel forgetful about what happened after the procedure.  Have poor judgment for several hours.  Feel nauseous or vomit.  Have a sore throat if you had a breathing tube during the procedure. Follow these instructions at home: For at least 24 hours after the procedure:    Do not:  Participate in activities in which you could fall or become injured.  Drive.  Use heavy machinery.  Drink alcohol.  Take sleeping pills or medicines that cause drowsiness.  Make important decisions or sign legal documents.  Take care of children on your own.  Rest. Eating and drinking   Follow the diet that is recommended by your health care provider.  If you vomit, drink water, juice, or soup when you can drink without  vomiting.  Make sure you have little or no nausea before eating solid foods. General instructions   Have a responsible adult stay with you until you are awake and alert.  Take over-the-counter and prescription medicines only as told by your health care provider.  If you smoke, do not smoke without supervision.  Keep all follow-up visits as told by your health care provider. This is important. Contact a health care provider if:  You keep feeling nauseous or you keep vomiting.  You feel light-headed.  You develop a rash.  You have a fever. Get help right away if:  You have trouble breathing. This information is not intended to replace advice given to you by your health care provider. Make sure you discuss any questions you have with your health care provider. Document Released: 11/03/2015 Document Revised: 03/04/2016 Document Reviewed: 11/03/2015 Elsevier Interactive Patient Education  2017 [employer].

## 2016-10-09 NOTE — Transfer of Care (Signed)
Immediate Anesthesia Transfer of Care Note  Patient: Edward Woodward  Procedure(s) Performed: Procedure(s): CYSTOSCOPY WITH BILATERAL RETROGRADE PYELOGRAM /BILATERAL  URETERAL STENT REMOVAL (Bilateral)  Patient Location: PACU  Anesthesia Type:MAC  Level of Consciousness: awake and patient cooperative  Airway & Oxygen Therapy: Patient Spontanous Breathing and Patient connected to face mask oxygen  Post-op Assessment: Report given to RN and Post -op Vital signs reviewed and stable  Post vital signs: Reviewed and stable  Last Vitals:  Vitals:   10/09/16 0831  BP: 128/64  Pulse: 65  Resp: 18  Temp: 36.4 C    Last Pain:  Vitals:   10/09/16 0831  TempSrc: Oral      Patients Stated Pain Goal: 3 (25/42/70 [id number])  Complications: No apparent anesthesia complications

## 2016-10-09 NOTE — Anesthesia Postprocedure Evaluation (Signed)
Anesthesia Post Note  Patient: [name]  Procedure(s) Performed: Procedure(s) (LRB): CYSTOSCOPY WITH BILATERAL RETROGRADE PYELOGRAM /BILATERAL  URETERAL STENT REMOVAL (Bilateral)  Patient location during evaluation: PACU Anesthesia Type: MAC Level of consciousness: awake and alert Pain management: pain level controlled Vital Signs Assessment: post-procedure vital signs reviewed and stable Respiratory status: spontaneous breathing, nonlabored ventilation, respiratory function stable and patient connected to nasal cannula oxygen Cardiovascular status: stable and blood pressure returned to baseline Anesthetic complications: no       Last Vitals:  Vitals:   10/09/16 1242 10/09/16 1313  BP: (!) 152/66 (!) 160/65  Pulse: 65 65  Resp: 17 19  Temp: 36.3 C 36.4 C    Last Pain:  Vitals:   10/09/16 1313  TempSrc: Oral  PainSc:                  Kiril Hippe DAVID

## 2016-10-11 ENCOUNTER — Other Ambulatory Visit: Payer: Self-pay | Admitting: Oncology

## 2016-10-12 DIAGNOSIS — C8598 Non-Hodgkin lymphoma, unspecified, lymph nodes of multiple sites: Secondary | ICD-10-CM | POA: Diagnosis not present

## 2016-10-12 DIAGNOSIS — M5136 Other intervertebral disc degeneration, lumbar region: Secondary | ICD-10-CM | POA: Diagnosis not present

## 2016-10-12 DIAGNOSIS — F329 Major depressive disorder, single episode, unspecified: Secondary | ICD-10-CM | POA: Diagnosis not present

## 2016-10-12 DIAGNOSIS — I2510 Atherosclerotic heart disease of native coronary artery without angina pectoris: Secondary | ICD-10-CM | POA: Diagnosis not present

## 2016-10-12 DIAGNOSIS — I509 Heart failure, unspecified: Secondary | ICD-10-CM | POA: Diagnosis not present

## 2016-10-12 DIAGNOSIS — I110 Hypertensive heart disease with heart failure: Secondary | ICD-10-CM | POA: Diagnosis not present

## 2016-10-13 ENCOUNTER — Telehealth: Payer: Self-pay | Admitting: Oncology

## 2016-10-13 ENCOUNTER — Other Ambulatory Visit (HOSPITAL_BASED_OUTPATIENT_CLINIC_OR_DEPARTMENT_OTHER): Payer: Medicare Other

## 2016-10-13 ENCOUNTER — Ambulatory Visit (HOSPITAL_BASED_OUTPATIENT_CLINIC_OR_DEPARTMENT_OTHER): Payer: Medicare Other | Admitting: Nurse Practitioner

## 2016-10-13 ENCOUNTER — Ambulatory Visit (HOSPITAL_BASED_OUTPATIENT_CLINIC_OR_DEPARTMENT_OTHER): Payer: Medicare Other

## 2016-10-13 VITALS — BP 171/67 | HR 66 | Temp 97.7°F | Resp 16

## 2016-10-13 VITALS — BP 149/73 | HR 64 | Temp 97.9°F | Resp 18 | Wt 190.8 lb

## 2016-10-13 DIAGNOSIS — C8223 Follicular lymphoma grade III, unspecified, intra-abdominal lymph nodes: Secondary | ICD-10-CM | POA: Diagnosis not present

## 2016-10-13 DIAGNOSIS — Z86718 Personal history of other venous thrombosis and embolism: Secondary | ICD-10-CM

## 2016-10-13 DIAGNOSIS — C61 Malignant neoplasm of prostate: Secondary | ICD-10-CM

## 2016-10-13 DIAGNOSIS — D630 Anemia in neoplastic disease: Secondary | ICD-10-CM

## 2016-10-13 DIAGNOSIS — N289 Disorder of kidney and ureter, unspecified: Secondary | ICD-10-CM

## 2016-10-13 DIAGNOSIS — Z7901 Long term (current) use of anticoagulants: Secondary | ICD-10-CM

## 2016-10-13 DIAGNOSIS — Z5112 Encounter for antineoplastic immunotherapy: Secondary | ICD-10-CM | POA: Diagnosis present

## 2016-10-13 DIAGNOSIS — C8299 Follicular lymphoma, unspecified, extranodal and solid organ sites: Secondary | ICD-10-CM

## 2016-10-13 LAB — COMPREHENSIVE METABOLIC PANEL
ALT: 10 U/L (ref 0–55)
ANION GAP: 10 meq/L (ref 3–11)
AST: 17 U/L (ref 5–34)
Albumin: 3.5 g/dL (ref 3.5–5.0)
Alkaline Phosphatase: 51 U/L (ref 40–150)
BUN: 29.7 mg/dL — ABNORMAL HIGH (ref 7.0–26.0)
CALCIUM: 9.8 mg/dL (ref 8.4–10.4)
CHLORIDE: 107 meq/L (ref 98–109)
CO2: 22 meq/L (ref 22–29)
CREATININE: 1.4 mg/dL — AB (ref 0.7–1.3)
EGFR: 43 mL/min/{1.73_m2} — ABNORMAL LOW (ref >90)
Glucose: 106 mg/dl (ref 70–140)
POTASSIUM: 4.5 meq/L (ref 3.5–5.1)
Sodium: 140 mEq/L (ref 136–145)
Total Bilirubin: 0.51 mg/dL (ref 0.20–1.20)
Total Protein: 6.1 g/dL — ABNORMAL LOW (ref 6.4–8.3)

## 2016-10-13 LAB — PROTIME-INR
INR: 1.1 — AB (ref 2.00–3.50)
PROTIME: 13.2 s (ref 10.6–13.4)

## 2016-10-13 LAB — CBC WITH DIFFERENTIAL/PLATELET
BASO%: 0.4 % (ref 0.0–2.0)
Basophils Absolute: 0 10*3/uL (ref 0.0–0.1)
EOS ABS: 0.1 10*3/uL (ref 0.0–0.5)
EOS%: 1.5 % (ref 0.0–7.0)
HEMATOCRIT: 30.4 % — AB (ref 38.4–49.9)
HGB: 10.2 g/dL — ABNORMAL LOW (ref 13.0–17.1)
LYMPH%: 27 % (ref 14.0–49.0)
MCH: 30.3 pg (ref 27.2–33.4)
MCHC: 33.6 g/dL (ref 32.0–36.0)
MCV: 90.2 fL (ref 79.3–98.0)
MONO#: 0.5 10*3/uL (ref 0.1–0.9)
MONO%: 5.8 % (ref 0.0–14.0)
NEUT%: 65.3 % (ref 39.0–75.0)
NEUTROS ABS: 5.6 10*3/uL (ref 1.5–6.5)
PLATELETS: 145 10*3/uL (ref 140–400)
RBC: 3.37 10*6/uL — ABNORMAL LOW (ref 4.20–5.82)
RDW: 15.3 % — ABNORMAL HIGH (ref 11.0–14.6)
WBC: 8.6 10*3/uL (ref 4.0–10.3)
lymph#: 2.3 10*3/uL (ref 0.9–3.3)
nRBC: 0 % (ref 0–0)

## 2016-10-13 MED ORDER — ACETAMINOPHEN 325 MG PO TABS
650.0000 mg | ORAL_TABLET | Freq: Once | ORAL | Status: AC
  Administered 2016-10-13: 650 mg via ORAL

## 2016-10-13 MED ORDER — SODIUM CHLORIDE 0.9 % IV SOLN
Freq: Once | INTRAVENOUS | Status: AC
  Administered 2016-10-13: 12:00:00 via INTRAVENOUS

## 2016-10-13 MED ORDER — DIPHENHYDRAMINE HCL 25 MG PO CAPS
ORAL_CAPSULE | ORAL | Status: AC
  Filled 2016-10-13: qty 2

## 2016-10-13 MED ORDER — ACETAMINOPHEN 325 MG PO TABS
ORAL_TABLET | ORAL | Status: AC
  Filled 2016-10-13: qty 2

## 2016-10-13 MED ORDER — DIPHENHYDRAMINE HCL 25 MG PO CAPS
50.0000 mg | ORAL_CAPSULE | Freq: Once | ORAL | Status: AC
  Administered 2016-10-13: 50 mg via ORAL

## 2016-10-13 MED ORDER — SODIUM CHLORIDE 0.9 % IV SOLN
375.0000 mg/m2 | Freq: Once | INTRAVENOUS | Status: AC
  Administered 2016-10-13: 800 mg via INTRAVENOUS
  Filled 2016-10-13: qty 50

## 2016-10-13 NOTE — Patient Instructions (Signed)
[facility] Discharge Instructions for Patients Receiving Chemotherapy  Today you received the following chemotherapy agents: Rituxan   To help prevent nausea and vomiting after your treatment, we encourage you to take your nausea medication as directed.    If you develop nausea and vomiting that is not controlled by your nausea medication, call the clinic.   BELOW ARE SYMPTOMS THAT SHOULD BE REPORTED IMMEDIATELY:  *FEVER GREATER THAN 100.5 F  *CHILLS WITH OR WITHOUT FEVER  NAUSEA AND VOMITING THAT IS NOT CONTROLLED WITH YOUR NAUSEA MEDICATION  *UNUSUAL SHORTNESS OF BREATH  *UNUSUAL BRUISING OR BLEEDING  TENDERNESS IN MOUTH AND THROAT WITH OR WITHOUT PRESENCE OF ULCERS  *URINARY PROBLEMS  *BOWEL PROBLEMS  UNUSUAL RASH Items with * indicate a potential emergency and should be followed up as soon as possible.  Feel free to call the clinic you have any questions or concerns. The clinic phone number is [phone].  Please show the CHEMO ALERT CARD at check-in to the Emergency Department and triage nurse.   

## 2016-10-13 NOTE — Progress Notes (Signed)
[facility] OFFICE PROGRESS NOTE   Diagnosis:  Non-Hodgkin's lymphoma, prostate cancer  INTERVAL HISTORY:   Edward Woodward returns as scheduled. He feels well. No fevers or sweats. He has completed physical therapy. Ureter stents were removed 10/09/2016. He resumed Coumadin yesterday.  Objective:  Vital signs in last 24 hours:  Blood pressure (!) 149/73, pulse 64, temperature 97.9 F (36.6 C), temperature source Oral, resp. rate 18, weight 190 lb 12.8 oz (86.5 kg), SpO2 97 %.    HEENT: No thrush or ulcers. Lymphatics: No palpable cervical or supraclavicular lymph nodes. Resp: Lungs clear bilaterally. Cardio: Regular rate and rhythm. GI: Abdomen soft and nontender. No organomegaly. Vascular: Trace edema at the lower legs bilaterally. Neuro: Alert and oriented.    Lab Results:  Lab Results  Component Value Date   WBC 8.6 10/13/2016   HGB 10.2 (L) 10/13/2016   HCT 30.4 (L) 10/13/2016   MCV 90.2 10/13/2016   PLT 145 10/13/2016   NEUTROABS 5.6 10/13/2016    Imaging:  No results found.  Medications: I have reviewed the patient's current medications.  Assessment/Plan: 1. Non-Hodgkin's lymphoma (follicular grade 3 lymphoma) - status post 6 cycles of Cytoxan/prednisone/rituximab 07/01/2007 through 10/21/2007.   Biopsy of a retroperitoneal mass 05/13/2016 consistent with involvement by low-grade non-Hodgkin's lymphoma  Initiation of weekly Rituxan 06/17/2016; week 4given 07/09/2016.  CT abdomen/pelvis 09/15/2016-improvement in retroperitoneal and pelvic soft tissue  Initiation of maintenance Rituxan every 3 months 10/13/2016 (2 year course planned) 2. CT chest 04/01/2015 with no lymphadenopathy  CT abdomen/pelvis 05/01/2015 with mild progression of abdomen/pelvic lymphadenopathy 3. Prostate cancer - he has been treated with hormonal therapy, the PSA was stable on 10/15/2014  PSA increased 04/26/2015   Bone scan 05/09/2015 with unchanged increased  activity in the thoracic and lumbar spine felt to most likely be degenerative.   Initiation of Abiraterone and prednisone 05/11/2015. Normalization of the PSA on Abiraterone. 4. History of a normocytic anemia secondary to non-Hodgkin's lymphoma, chemotherapy, and renal insufficiency - progressive secondary to GI bleeding, stable 5. History of Mild thrombocytopenia  6. History of bilateral hydronephrosis. Renal ultrasound 09/26/2014 consistent with medical renal disease. No hydronephrosis. Ureter stents removed 10/09/2016. 7. Coronary artery disease - status post coronary artery stent placement. 8. History of noncalcified lung nodules. 9. History of severe neutropenia July 2009 - likely related to delayed toxicity from rituximab. 10. Macular degeneration. 11. Right middle lobe density on a CT of the chest 06/18/2010 measuring 2.5 x 1.6 cm with mildly increased metabolic activity (SUV max 2.5) on a PET scan 06/27/2010. The area of increased density was less confluent and appeared smaller on a restaging CT 09/08/2010. Right middle lobe "scarring" with no new or suspicious pulmonary nodule on a CT 08/04/2011. 12. Mild increased metabolic activity associated with several lymph nodes on the PET scan 06/27/2010 - likely related to lymphoma. No lymphadenopathy in the mediastinum or axillary areas on the CT 08/04/2011. 13. Right hip discomfort-he received a steroid injection by his primary physician without improvement. He saw Dr. Collier Salina and there was a question of a lytic process in the right pelvis. A bone scan on 09/21/2012 revealed no evidence of metastatic disease. 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 Renal insufficiency-progressive CT 01/12/2016 confirmed progressive hydronephrosis  Placement of bilateral ureter stents 04/10/2016  Ureter stents removed 10/09/2016 15. Left leg DVT and pulmonary embolism 04/01/2015-on Coumadin 16. Admission 08/23/2015 with  severe anemia and GI bleeding  Upper and lower endoscopy 08/26/2015 without  a clear source for bleeding identified 17. Leg edema/anasarca-improved with leg wrapping and diuretics 18. Anemia secondary to chronic disease, renal failure, and possibly lymphoma 19. 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   Biopsy retroperitoneal soft tissue 05/13/2016 20. History of elevated calcium-most likely secondary to non-Hodgkin's lymphoma status post pamidronate 06/08/2016, calcitonin 06/11/2016     Disposition: Edward Woodward appears stable. Plan to proceed with maintenance Rituxan today as scheduled. He will return for the next Rituxan in 3 months.  Coumadin was placed on hold for removal of the ureter stents. He resumed Coumadin last night. He will continue with the current dose and return for a follow-up PT/INR in one week.  He will return for a follow-up visit in 3 months. He will contact the office in the interim with any problems.  Plan reviewed with Dr. Benay Spice.   [name] ANP/GNP-BC   10/13/2016  11:07 AM

## 2016-10-13 NOTE — Telephone Encounter (Signed)
Chem, labs, flush and follow up appointments scheduled per 10/13/16 los. Patient was given a copy of the AVS report and appointment schedule, per 10/13/16 los.

## 2016-10-16 ENCOUNTER — Encounter (HOSPITAL_COMMUNITY)
Admission: RE | Admit: 2016-10-16 | Discharge: 2016-10-16 | Disposition: A | Discharge status: Home / Self Care | Payer: Medicare Other | Source: Ambulatory Visit | Attending: Urology | Admitting: Urology

## 2016-10-16 DIAGNOSIS — N1330 Unspecified hydronephrosis: Secondary | ICD-10-CM | POA: Diagnosis not present

## 2016-10-16 DIAGNOSIS — C8590 Non-Hodgkin lymphoma, unspecified, unspecified site: Secondary | ICD-10-CM | POA: Diagnosis not present

## 2016-10-16 MED ORDER — FUROSEMIDE 10 MG/ML IJ SOLN
INTRAMUSCULAR | Status: AC
  Filled 2016-10-16: qty 8

## 2016-10-16 MED ORDER — TECHNETIUM TC 99M MERTIATIDE: 5.1000 | Freq: Once | INTRAVENOUS | Status: DC | PRN

## 2016-10-16 MED ORDER — FUROSEMIDE 10 MG/ML IJ SOLN
INTRAMUSCULAR | Status: AC
  Filled 2016-10-16: qty 4

## 2016-10-16 MED ORDER — FUROSEMIDE 10 MG/ML IJ SOLN
43.0000 mg | Freq: Once | INTRAMUSCULAR | Status: AC
  Administered 2016-10-16: 43 mg via INTRAVENOUS

## 2016-10-19 ENCOUNTER — Other Ambulatory Visit: Payer: Self-pay | Admitting: *Deleted

## 2016-10-19 DIAGNOSIS — I4891 Unspecified atrial fibrillation: Secondary | ICD-10-CM

## 2016-10-20 ENCOUNTER — Telehealth: Payer: Self-pay | Admitting: *Deleted

## 2016-10-20 ENCOUNTER — Other Ambulatory Visit (HOSPITAL_BASED_OUTPATIENT_CLINIC_OR_DEPARTMENT_OTHER): Payer: Medicare Other

## 2016-10-20 DIAGNOSIS — I4891 Unspecified atrial fibrillation: Secondary | ICD-10-CM

## 2016-10-20 DIAGNOSIS — Z86718 Personal history of other venous thrombosis and embolism: Secondary | ICD-10-CM | POA: Diagnosis present

## 2016-10-20 LAB — PROTIME-INR
INR: 1.1 — AB (ref 2.00–3.50)
PROTIME: 13.2 s (ref 10.6–13.4)

## 2016-10-20 MED ORDER — WARFARIN SODIUM 5 MG PO TABS: 5.0000 mg | ORAL_TABLET | Freq: Every day | ORAL | 0 refills | Status: DC

## 2016-10-20 NOTE — Telephone Encounter (Signed)
Called pt's wife, she reports he resumed Coumadin on 3/20 at dose of 2.5mg   MWF and 5 mg all other days.  INR reviewed by Dr. Benay Spice: Order received to increase dose to 5 mg daily. Recheck lab in 2 weeks. Teach back done with wife. Message to scheduler for appt.

## 2016-10-22 ENCOUNTER — Telehealth: Payer: Self-pay | Admitting: Oncology

## 2016-10-22 NOTE — Telephone Encounter (Signed)
Confirmed lab only appt 4/10 at 1130 per LOS

## 2016-10-27 DIAGNOSIS — H35353 Cystoid macular degeneration, bilateral: Secondary | ICD-10-CM | POA: Diagnosis not present

## 2016-10-27 DIAGNOSIS — H353232 Exudative age-related macular degeneration, bilateral, with inactive choroidal neovascularization: Secondary | ICD-10-CM | POA: Diagnosis not present

## 2016-10-27 DIAGNOSIS — H43813 Vitreous degeneration, bilateral: Secondary | ICD-10-CM | POA: Diagnosis not present

## 2016-10-27 DIAGNOSIS — Z961 Presence of intraocular lens: Secondary | ICD-10-CM | POA: Diagnosis not present

## 2016-11-02 ENCOUNTER — Encounter: Payer: Self-pay | Admitting: Internal Medicine

## 2016-11-02 ENCOUNTER — Ambulatory Visit (INDEPENDENT_AMBULATORY_CARE_PROVIDER_SITE_OTHER): Payer: Medicare Other | Admitting: Internal Medicine

## 2016-11-02 VITALS — BP 130/70 | HR 73 | Ht 70.0 in | Wt 197.0 lb

## 2016-11-02 DIAGNOSIS — I480 Paroxysmal atrial fibrillation: Secondary | ICD-10-CM

## 2016-11-02 DIAGNOSIS — I495 Sick sinus syndrome: Secondary | ICD-10-CM | POA: Diagnosis not present

## 2016-11-02 DIAGNOSIS — Z950 Presence of cardiac pacemaker: Secondary | ICD-10-CM | POA: Diagnosis not present

## 2016-11-02 LAB — CUP PACEART INCLINIC DEVICE CHECK
Implantable Lead Implant Date: 20081013
Implantable Lead Implant Date: 20081013
Implantable Lead Location: 753860
Implantable Lead Model: 4470
Implantable Lead Serial Number: 205344
Implantable Pulse Generator Implant Date: 20081013
MDC IDC LEAD LOCATION: 753859
MDC IDC LEAD SERIAL: 210110
MDC IDC SESS DTM: 20180409133702
Pulse Gen Model: 1297
Pulse Gen Serial Number: 317986

## 2016-11-02 NOTE — Progress Notes (Signed)
Patient Care Team: Lajean Manes, MD as PCP - General (Internal Medicine) Ladell Pier, MD as Attending Physician (Hematology and Oncology) Myrlene Broker, MD as Attending Physician (Urology)   HPI  Edward Woodward is a 83 y.o. male Seen to establish pacemaker followup needed number of years ago for sinus node dysfunction and presyncope. He has alleviated his symptoms.  He also has a history of coronary artery disease with mild left ventricular dysfunction with EF of 50% 2011.  He has no history of atrial fibrillation but was documented time of his pneumothorax that was a consequence of a fall 12/14 .  Functional status is stable.  He has been found to have hydronephrosis 2./2 lymphoma obstruction of ureter-- stented and removed and now with more problems    Past Medical History:  Diagnosis Date  . AMD (age-related macular degeneration), bilateral   . Anemia   . Anginal pain ([place])    pts wife states pt had to use nitroglycerin this am 04/09/2016  . CAD (coronary artery disease)     NYHA Class 1B, CABG 1985  . CHF (congestive heart failure) ([place])   . Collagen vascular disease ([place])   . Collapsed lung    right   . Complication of anesthesia    pts wife states pt gets confused   . Degeneration of lumbar or lumbosacral intervertebral disc   . Depressive disorder, not elsewhere classified   . Dysrhythmia   . GERD (gastroesophageal reflux disease)   . History of blood transfusion   . History of hiatal hernia   . HTN (hypertension)   . Hypercholesteremia   . Lower leg edema    bilat  . Multiple falls   . Myocardial infarction   . Neuromuscular disorder (HCC)    neuropathy  . Neuropathy ([place])   . Non Hodgkin's lymphoma ([place])   . OSA on CPAP   . PE (pulmonary embolism) 03/2015  . Presence of permanent cardiac pacemaker   . Prostate cancer ([place])   . Shortness of breath dyspnea    with exertion   . Sinoatrial node dysfunction (HCC)   . Ulcers of both  lower legs (HCC)    history of   . Urinary incontinence     Past Surgical History:  Procedure Laterality Date  . APPENDECTOMY    . BACK SURGERY    . CARDIAC CATHETERIZATION N/A 08/27/2015   Procedure: Left Heart Cath and Cors/Grafts Angiography;  Surgeon: Belva Crome, MD;  Location: [facility];  Service: Cardiovascular;  Laterality: N/A;  . COLONOSCOPY N/A 08/26/2015   Procedure: COLONOSCOPY;  Surgeon: Clarene Essex, MD;  Location: [facility] ENDOSCOPY;  Service: Endoscopy;  Laterality: N/A;  . CORONARY ANGIOPLASTY WITH STENT PLACEMENT     s/p bare metal stent implant SVG-diagonal 11/23/05, s/p BM stent implantation SVG diagonal 10/22/06 (feeds LAD), and 05/2010 with DES to left circumflex  . [place]   SVG to Diagonal/LAD,  seqSVG to OM1 and OM2  . CYSTOSCOPY W/ URETERAL STENT PLACEMENT Bilateral 04/10/2016   Procedure: CYSTOSCOPY WITH RETROGRADE PYELOGRAM/URETERAL STENT PLACEMENT;  Surgeon: Festus Aloe, MD;  Location: [facility] ORS;  Service: Urology;  Laterality: Bilateral;  . CYSTOSCOPY W/ URETERAL STENT PLACEMENT Bilateral 10/09/2016   Procedure: CYSTOSCOPY WITH BILATERAL RETROGRADE PYELOGRAM [name]  URETERAL STENT REMOVAL;  Surgeon: Festus Aloe, MD;  Location: [facility] ORS;  Service: Urology;  Laterality: Bilateral;  . ENDARTERECTOMY  bilat   . ESOPHAGOGASTRODUODENOSCOPY (EGD) WITH PROPOFOL N/A 08/26/2015   Procedure: ESOPHAGOGASTRODUODENOSCOPY (EGD) WITH PROPOFOL;  Surgeon: Clarene Essex, MD;  Location: [facility] ENDOSCOPY;  Service: Endoscopy;  Laterality: N/A;  . EXPLORATORY LAPAROTOMY    . LUMBAR FUSION    . PACEMAKER INSERTION    . ROTATOR CUFF REPAIR     bilat  . TONSILLECTOMY      Current Outpatient Prescriptions  Medication Sig Dispense Refill  . abiraterone Acetate (ZYTIGA) 250 MG tablet Take 4 tablets (1,000 mg total) by mouth daily. Take on an empty stomach 1 hour before or 2 hours after a meal 120 tablet 1  . acetaminophen (TYLENOL)  500 MG tablet Take 1,000 mg by mouth every 6 (six) hours as needed for mild pain, moderate pain or headache.     Marland Kitchen atorvastatin (LIPITOR) 40 MG tablet TAKE ONE TABLET BY MOUTH EVERY OTHER DAY 30 tablet 11  . benzocaine (ORAJEL) 10 % mucosal gel Use as directed 1 application in the mouth or throat as needed for mouth pain.    . citalopram (CELEXA) 20 MG tablet Take 20 mg by mouth at bedtime.     . clonazePAM (KLONOPIN) 0.25 MG disintegrating tablet DISSOLVE ONE TABLET IN MOUTH TWICE DAILY (Patient taking differently: Take 0.0625-0.125 mg by mouth 2 (two) times daily. Take 0.25 tablet (0.0625 mg) in the morning & 0.5 tablet (0.125 mg) at night) 60 tablet 5  . fluticasone (FLONASE) 50 MCG/ACT nasal spray Place 1 spray into both nostrils daily as needed for allergies or rhinitis.     Marland Kitchen HYDROcodone-acetaminophen (NORCO/VICODIN) 5-325 MG tablet Take 1 tablet by mouth at bedtime as needed (for pain.).     Marland Kitchen hydrocortisone 2.5 % ointment Apply 1 application topically 2 (two) times daily as needed. Applied to affected area(s) of skin( pressure sores)    . isosorbide mononitrate (IMDUR) 30 MG 24 hr tablet Take 30 mg by mouth at bedtime.    . Lutein-Zeaxanthin 25-5 MG CAPS Take 1 capsule by mouth daily.     . magnesium hydroxide (MILK OF MAGNESIA) 400 MG/5ML suspension Take 5 mLs by mouth daily as needed for mild constipation.     . Melatonin 3 MG TABS Take 3 mg by mouth at bedtime.    . metoprolol succinate (TOPROL-XL) 50 MG 24 hr tablet Take 25 mg by mouth at bedtime. Take with or immediately following a meal.    . nitroGLYCERIN (NITROLINGUAL) 0.4 MG/SPRAY spray Place 1 spray under the tongue every 5 (five) minutes x 3 doses as needed for chest pain. 12 g 3  . ondansetron (ZOFRAN) 4 MG tablet Take 1 tablet (4 mg total) by mouth every 8 (eight) hours as needed for nausea or vomiting. 20 tablet 0  . Polyethyl Glycol-Propyl Glycol (SYSTANE) 0.4-0.3 % SOLN Place 1 drop into both eyes at bedtime.     . predniSONE  (DELTASONE) 5 MG tablet TAKE 1 TABLET BY MOUTH ONCE DAILY 90 tablet 0  . torsemide (DEMADEX) 20 MG tablet Take 10 mg by mouth daily.     . traMADol (ULTRAM) 50 MG tablet Take 1 tablet (50 mg total) by mouth every 6 (six) hours as needed. 30 tablet 0  . triamcinolone ointment (KENALOG) 0.1 % Apply 1 application topically See admin instructions. Use once to twice daily applied to affected area(s) of skin.    Marland Kitchen vitamin B-12 (CYANOCOBALAMIN) 1000 MCG tablet Take 1,000 mcg by mouth at bedtime.     Marland Kitchen warfarin (COUMADIN) 5 MG tablet  Take 1 tablet (5 mg total) by mouth daily at 6 PM.  0   No current facility-administered medications for this visit.     Allergies  Allergen Reactions  . Penicillins Hives, Shortness Of Breath and Other (See Comments)    Has patient had a PCN reaction causing immediate rash, facial/tongue/throat swelling, SOB or lightheadedness with hypotension: Yes Has patient had a PCN reaction causing severe rash involving mucus membranes or skin necrosis: No Has patient had a PCN reaction that required hospitalization No Has patient had a PCN reaction occurring within the last 10 years: No If all of the above answers are "NO", then may proceed with Cephalosporin use.   . Simvastatin Other (See Comments)    Reaction:  Muscle weakness    Review of Systems negative except from HPI and PMH  Physical Exam BP 130/70   Pulse 73   Ht 5\' 10"  (1.778 m)   Wt 197 lb (89.4 kg)   SpO2 97%   BMI 28.27 kg/m  Well developed and nourished in no acute distress HENT normal Neck supple with 7-8 cm Clear Regular rate and rhythm, decreased S1 2/6 murmur  Abd-soft with active BS No Clubbing cyanosis L greater than the right 2+ edema Skin-warm and dry A & Oriented  Grossly normal sensory and motor function     Assessment and Plan  Sinus node dysfunction  Atrial fibrillation   Coronary artery disease  Hypertension   Pacemaker-[employer]     There is no significant  detected atrial fibrillation <1 min)  Device approaching ERI and will be seen in two months  Without symptoms of ischemia  No intercurrent atrial fibrillation or flutter  On Anticoagulation;  No bleeding issues     t

## 2016-11-02 NOTE — Patient Instructions (Signed)
Medication Instructions: - Your physician recommends that you continue on your current medications as directed. Please refer to the Current Medication list given to you today.  Labwork: - none ordered  Procedures/Testing: - none ordered  Follow-Up: . Your physician recommends that you schedule a follow-up appointment in: 2 months with the [facility] for a battery check.   Any Additional Special Instructions Will Be Listed Below (If Applicable).     If you need a refill on your cardiac medications before your next appointment, please call your pharmacy.

## 2016-11-03 ENCOUNTER — Other Ambulatory Visit (HOSPITAL_BASED_OUTPATIENT_CLINIC_OR_DEPARTMENT_OTHER): Payer: Medicare Other

## 2016-11-03 ENCOUNTER — Telehealth: Payer: Self-pay | Admitting: *Deleted

## 2016-11-03 DIAGNOSIS — I4891 Unspecified atrial fibrillation: Secondary | ICD-10-CM

## 2016-11-03 DIAGNOSIS — Z86718 Personal history of other venous thrombosis and embolism: Secondary | ICD-10-CM | POA: Diagnosis present

## 2016-11-03 LAB — PROTIME-INR
INR: 1.9 — AB (ref 2.00–3.50)
PROTIME: 22.8 s — AB (ref 10.6–13.4)

## 2016-11-03 NOTE — Telephone Encounter (Signed)
Telephone call to patient- confirmed coumadin dose 5mg  daily. Wife verbalized an understanding of directions and return to clinic instructions.  Wife states pt has another kidney blockage. The ureter stents were removed and another blockage has occurred. Pt has appointment with urologist on Friday to discuss new treatment plan. Wife is concerned that pt INR will be too elevated for a procedure. Assured wife that we would help to facilitate coumadin monitoring with urologist prior to any procedure being completed. She will be sure to contact this office and have urology contact us prior to scheduling any procedure.   Pt wife understands to call this office with any concerns or questions. She appreciated the call and will let Dr. Benay Spice know what they decide on Friday.

## 2016-11-06 DIAGNOSIS — N1330 Unspecified hydronephrosis: Secondary | ICD-10-CM | POA: Diagnosis not present

## 2016-11-07 ENCOUNTER — Other Ambulatory Visit: Payer: Self-pay | Admitting: Oncology

## 2016-11-23 ENCOUNTER — Other Ambulatory Visit: Payer: Self-pay | Admitting: *Deleted

## 2016-11-23 ENCOUNTER — Encounter: Payer: Self-pay | Admitting: Oncology

## 2016-11-23 ENCOUNTER — Telehealth: Payer: Self-pay | Admitting: *Deleted

## 2016-11-23 ENCOUNTER — Telehealth: Payer: Self-pay | Admitting: Oncology

## 2016-11-23 DIAGNOSIS — C8299 Follicular lymphoma, unspecified, extranodal and solid organ sites: Secondary | ICD-10-CM

## 2016-11-23 DIAGNOSIS — C61 Malignant neoplasm of prostate: Secondary | ICD-10-CM

## 2016-11-23 NOTE — Telephone Encounter (Signed)
Call placed to patient to notify him that labs can be done this week.  Patient states that Tuesday or Wednesday works best for him.  Message sent to scheduling.

## 2016-11-23 NOTE — Telephone Encounter (Signed)
Called patient to schedule lab appointment. Date/time per patient.

## 2016-11-24 ENCOUNTER — Other Ambulatory Visit (HOSPITAL_BASED_OUTPATIENT_CLINIC_OR_DEPARTMENT_OTHER): Payer: Medicare Other

## 2016-11-24 ENCOUNTER — Telehealth: Payer: Self-pay | Admitting: *Deleted

## 2016-11-24 DIAGNOSIS — C8299 Follicular lymphoma, unspecified, extranodal and solid organ sites: Secondary | ICD-10-CM

## 2016-11-24 DIAGNOSIS — C61 Malignant neoplasm of prostate: Secondary | ICD-10-CM

## 2016-11-24 DIAGNOSIS — Z86718 Personal history of other venous thrombosis and embolism: Secondary | ICD-10-CM | POA: Diagnosis present

## 2016-11-24 LAB — PROTIME-INR
INR: 2.1 (ref 2.00–3.50)
PROTIME: 25.2 s — AB (ref 10.6–13.4)

## 2016-11-24 NOTE — Telephone Encounter (Signed)
Call placed to patient's wife to inform her per order of L. Thomas NP to continue same dose of Coumadin every day and that PT/INR will be checked again in 3 weeks.  Edward Woodward states that pt is currently taking 5 mg daily.  Teach back done.

## 2016-11-27 ENCOUNTER — Telehealth: Payer: Self-pay | Admitting: Oncology

## 2016-11-27 NOTE — Telephone Encounter (Signed)
Confirmed 5/22 appt at 12 per LOS

## 2016-12-03 ENCOUNTER — Other Ambulatory Visit: Payer: Self-pay | Admitting: *Deleted

## 2016-12-03 MED ORDER — WARFARIN SODIUM 5 MG PO TABS: 5.0000 mg | ORAL_TABLET | Freq: Every day | ORAL | 1 refills | Status: DC

## 2016-12-15 ENCOUNTER — Telehealth: Payer: Self-pay | Admitting: *Deleted

## 2016-12-15 ENCOUNTER — Other Ambulatory Visit (HOSPITAL_BASED_OUTPATIENT_CLINIC_OR_DEPARTMENT_OTHER): Payer: Medicare Other

## 2016-12-15 ENCOUNTER — Other Ambulatory Visit: Payer: Self-pay | Admitting: *Deleted

## 2016-12-15 DIAGNOSIS — Z86718 Personal history of other venous thrombosis and embolism: Secondary | ICD-10-CM | POA: Diagnosis present

## 2016-12-15 DIAGNOSIS — C8299 Follicular lymphoma, unspecified, extranodal and solid organ sites: Secondary | ICD-10-CM

## 2016-12-15 LAB — PROTIME-INR
INR: 2.1 (ref 2.00–3.50)
Protime: 25.2 Seconds — ABNORMAL HIGH (ref 10.6–13.4)

## 2016-12-15 NOTE — Telephone Encounter (Signed)
Patient's wife notified per order of Dr. Benay Spice to continue current dose of Coumadin and that PT will be checked at next appt on 01/05/17.  Coumadin dose confirmed as 5 mg daily per pt.'s wife.  Pt.'s wife appreciative of call and has no questions at this time.

## 2016-12-21 NOTE — Progress Notes (Signed)
Cardiology Office Note    Date:  12/22/2016   ID:  Edward Woodward, DOB November 23, 1926, MRN [id number]  PCP:  Lajean Manes, MD  Cardiologist: Sinclair Grooms, MD   Chief Complaint  Patient presents with  . Congestive Heart Failure  . Loss of Consciousness    History of Present Illness:  Edward Woodward is a 83 y.o. male who presents for Chronic systolic/diastolic heart failure, permanent pacemaker insertion, atrial fibrillation, hypertension, history of pulmonary emboli, chronic venous insufficiency with peripheral edema, History of DVT, lymphoma and currently receiving chemotherapy, and obstructive sleep apnea.  He denies chest pain. There is no orthopnea PND. He did have an episode of profound weakness and diaphoresis when they were preparing to go on a trip 10 days ago. The car then packed and he suddenly began to feel sluggish. He sat, increase fluid intake, and began feeling better. The sensation has not recurred. His blood pressure was low during the episode in the range of 90-95 mmHg. He felt that he was undistressed run again ready for the trip.  Past Medical History:  Diagnosis Date  . AMD (age-related macular degeneration), bilateral   . Anemia   . Anginal pain ([place])    pts wife states pt had to use nitroglycerin this am 04/09/2016  . CAD (coronary artery disease)     NYHA Class 1B, CABG 1985  . CHF (congestive heart failure) ([place])   . Collagen vascular disease ([place])   . Collapsed lung    right   . Complication of anesthesia    pts wife states pt gets confused   . Degeneration of lumbar or lumbosacral intervertebral disc   . Depressive disorder, not elsewhere classified   . Dysrhythmia   . GERD (gastroesophageal reflux disease)   . History of blood transfusion   . History of hiatal hernia   . HTN (hypertension)   . Hypercholesteremia   . Lower leg edema    bilat  . Multiple falls   . Myocardial infarction ([place])   . Neuromuscular disorder (HCC)    neuropathy   . Neuropathy   . Non Hodgkin's lymphoma ([place])   . OSA on CPAP   . PE (pulmonary embolism) 03/2015  . Presence of permanent cardiac pacemaker   . Prostate cancer ([place])   . Shortness of breath dyspnea    with exertion   . Sinoatrial node dysfunction (HCC)   . Ulcers of both lower legs (HCC)    history of   . Urinary incontinence     Past Surgical History:  Procedure Laterality Date  . APPENDECTOMY    . BACK SURGERY    . CARDIAC CATHETERIZATION N/A 08/27/2015   Procedure: Left Heart Cath and Cors/Grafts Angiography;  Surgeon: Belva Crome, MD;  Location: [facility];  Service: Cardiovascular;  Laterality: N/A;  . COLONOSCOPY N/A 08/26/2015   Procedure: COLONOSCOPY;  Surgeon: Clarene Essex, MD;  Location: [facility] ENDOSCOPY;  Service: Endoscopy;  Laterality: N/A;  . CORONARY ANGIOPLASTY WITH STENT PLACEMENT     s/p bare metal stent implant SVG-diagonal 11/23/05, s/p BM stent implantation SVG diagonal 10/22/06 (feeds LAD), and 05/2010 with DES to left circumflex  . [place]   SVG to Diagonal/LAD,  seqSVG to OM1 and OM2  . CYSTOSCOPY W/ URETERAL STENT PLACEMENT Bilateral 04/10/2016   Procedure: CYSTOSCOPY WITH RETROGRADE PYELOGRAM/URETERAL STENT PLACEMENT;  Surgeon: Festus Aloe, MD;  Location: [facility];  Service:  Urology;  Laterality: Bilateral;  . CYSTOSCOPY W/ URETERAL STENT PLACEMENT Bilateral 10/09/2016   Procedure: CYSTOSCOPY WITH BILATERAL RETROGRADE PYELOGRAM [name]  URETERAL STENT REMOVAL;  Surgeon: Festus Aloe, MD;  Location: [facility];  Service: Urology;  Laterality: Bilateral;  . ENDARTERECTOMY     bilat   . ESOPHAGOGASTRODUODENOSCOPY (EGD) WITH PROPOFOL N/A 08/26/2015   Procedure: ESOPHAGOGASTRODUODENOSCOPY (EGD) WITH PROPOFOL;  Surgeon: Clarene Essex, MD;  Location[facility];  Service: Endoscopy;  Laterality: N/A;  . EXPLORATORY LAPAROTOMY    . LUMBAR FUSION    . PACEMAKER INSERTION    . ROTATOR CUFF REPAIR     bilat  .  TONSILLECTOMY      Current Medications: Outpatient Medications Prior to Visit  Medication Sig Dispense Refill  . abiraterone Acetate (ZYTIGA) 250 MG tablet Take 4 tablets (1,000 mg total) by mouth daily. Take on an empty stomach 1 hour before or 2 hours after a meal 120 tablet 1  . acetaminophen (TYLENOL) 500 MG tablet Take 1,000 mg by mouth every 6 (six) hours as needed for mild pain, moderate pain or headache.     Marland Kitchen atorvastatin (LIPITOR) 40 MG tablet TAKE ONE TABLET BY MOUTH EVERY OTHER DAY 30 tablet 11  . citalopram (CELEXA) 20 MG tablet Take 20 mg by mouth at bedtime.     . fluticasone (FLONASE) 50 MCG/ACT nasal spray Place 1 spray into both nostrils daily as needed for allergies or rhinitis.     Marland Kitchen HYDROcodone-acetaminophen (NORCO/VICODIN) 5-325 MG tablet Take 1 tablet by mouth at bedtime as needed (for pain.).     Marland Kitchen hydrocortisone 2.5 % ointment Apply 1 application topically 2 (two) times daily as needed. Applied to affected area(s) of skin( pressure sores)    . isosorbide mononitrate (IMDUR) 30 MG 24 hr tablet Take 30 mg by mouth at bedtime.    . Lutein-Zeaxanthin 25-5 MG CAPS Take 1 capsule by mouth daily.     . magnesium hydroxide (MILK OF MAGNESIA) 400 MG/5ML suspension Take 5 mLs by mouth daily as needed for mild constipation.     . Melatonin 3 MG TABS Take 3 mg by mouth at bedtime.    . metoprolol succinate (TOPROL-XL) 50 MG 24 hr tablet Take 25 mg by mouth at bedtime. Take with or immediately following a meal.    . nitroGLYCERIN (NITROLINGUAL) 0.4 MG/SPRAY spray Place 1 spray under the tongue every 5 (five) minutes x 3 doses as needed for chest pain. 12 g 3  . ondansetron (ZOFRAN) 4 MG tablet Take 1 tablet (4 mg total) by mouth every 8 (eight) hours as needed for nausea or vomiting. 20 tablet 0  . Polyethyl Glycol-Propyl Glycol (SYSTANE) 0.4-0.3 % SOLN Place 1 drop into both eyes at bedtime.     . predniSONE (DELTASONE) 5 MG tablet TAKE 1 TABLET BY MOUTH ONCE DAILY 90 tablet 0  .  torsemide (DEMADEX) 20 MG tablet Take 10 mg by mouth daily.     . traMADol (ULTRAM) 50 MG tablet Take 1 tablet (50 mg total) by mouth every 6 (six) hours as needed. 30 tablet 0  . triamcinolone ointment (KENALOG) 0.1 % Apply 1 application topically See admin instructions. Use once to twice daily applied to affected area(s) of skin.    Marland Kitchen vitamin B-12 (CYANOCOBALAMIN) 1000 MCG tablet Take 1,000 mcg by mouth at bedtime.     Marland Kitchen warfarin (COUMADIN) 5 MG tablet Take 1 tablet (5 mg total) by mouth daily at 6 PM. 30 tablet 1  . benzocaine (  ORAJEL) 10 % mucosal gel Use as directed 1 application in the mouth or throat as needed for mouth pain.    . clonazePAM (KLONOPIN) 0.25 MG disintegrating tablet DISSOLVE ONE TABLET IN MOUTH TWICE DAILY (Patient not taking: Reported on 12/22/2016) 60 tablet 5   No facility-administered medications prior to visit.      Allergies:   Penicillins and Simvastatin   Social History   Social History  . Marital status: Married    Spouse name: N/A  . Number of children: 3  . Years of education: N/A   Occupational History  . Retired Optometrist    Social History Main Topics  . Smoking status: Former Smoker    Packs/day: 0.50    Years: 5.00    Types: Cigarettes    Quit date: 07/28/1955  . Smokeless tobacco: Never Used  . Alcohol use No  . Drug use: No  . Sexual activity: No   Other Topics Concern  . None   Social History Narrative   Lives with wife in a one story home.  Has 3 children and 2 stepchildren.  Retired Optometrist.  Education: college.     Family History:  The patient's family history includes Cancer in his mother; Heart disease in his brother; Heart disease (age of onset: 33) in his father.   ROS:   Please see the history of present illness.    Hearing loss, vision disturbance, snoring, constipation, dizziness, easy bruising, and leg swelling.`  All other systems reviewed and are negative.   PHYSICAL EXAM:   VS:  BP 128/64 (BP Location: Right  Arm)   Pulse 65   Ht 5\' 10"  (1.778 m)   Wt 193 lb 12.8 oz (87.9 kg)   BMI 27.81 kg/m    GEN: Well nourished, well developed, in no acute distress  HEENT: normal  Neck: no JVD, carotid bruits, or masses Cardiac: RRR; no murmurs, rubs, or gallops. There is no edema Respiratory:  clear to auscultation bilaterally, normal work of breathing GI: soft, nontender, nondistended, + BS MS: no deformity or atrophy  Skin: warm and dry, no rash Neuro:  Alert and Oriented x 3, Strength and sensation are intact Psych: euthymic mood, full affect  Wt Readings from Last 3 Encounters:  12/22/16 193 lb 12.8 oz (87.9 kg)  11/02/16 197 lb (89.4 kg)  10/13/16 190 lb 12.8 oz (86.5 kg)      Studies/Labs Reviewed:   EKG:  EKG  Not repeated  Recent Labs: 03/06/2016: TSH 0.73 06/11/2016: Magnesium 1.8 06/29/2016: Brain Natriuretic Peptide 234.2 10/13/2016: ALT 10; BUN 29.7; Creatinine 1.4; HGB 10.2; Platelets 145; Potassium 4.5; Sodium 140   Lipid Panel    Component Value Date/Time   CHOL 139 08/24/2015 0343   TRIG 134 08/24/2015 0343   HDL 32 (L) 08/24/2015 0343   CHOLHDL 4.3 08/24/2015 0343   VLDL 27 08/24/2015 0343   LDLCALC 80 08/24/2015 0343    Additional studies/ records that were reviewed today include:  none    ASSESSMENT:    1. Atherosclerosis of coronary artery bypass graft of native heart without angina pectoris   2. Paroxysmal atrial fibrillation (HCC)   3. Chronic diastolic heart failure ([place])   4. Essential hypertension, benign   5. Peripheral vascular disease ([place])   6. Sinus node dysfunction chronotropic incompetence      PLAN:  In order of problems listed above:  1. Stable, without angina. Instructed to use nitroglycerin if chest discomfort. 2. Clinically the patient is in sinus  rhythm today. 3. There is no evidence of volume overload. 4. The blood pressures in a good range today. If he has recurring episodes of weakness and low blood pressure we may need to make  adjustments in medical regimen, perhaps discontinuing isosorbide or decreasing the dose of metoprolol. 5. Not addressed 6. Not addressed  Clinical follow-up in 6-9 months. If increasing edema, dyspnea, chest pain, or recurring near syncope please call.    Medication Adjustments/Labs and Tests Ordered: Current medicines are reviewed at length with the patient today.  Concerns regarding medicines are outlined above.  Medication changes, Labs and Tests ordered today are listed in the Patient Instructions below. Patient Instructions  Medication Instructions:  None  Labwork: None  Testing/Procedures: None  Follow-Up: Your physician wants you to follow-up in: 6-9 months with Dr. Tamala Julian.  You will receive a reminder letter in the mail two months in advance. If you don't receive a letter, please call our office to schedule the follow-up appointment.   Any Other Special Instructions Will Be Listed Below (If Applicable).     If you need a refill on your cardiac medications before your next appointment, please call your pharmacy.      Signed, Sinclair Grooms, MD  12/22/2016 6:00 PM    [address], [place], [place]  [address] Phone: [phone]; Fax: [phone]

## 2016-12-22 ENCOUNTER — Encounter: Payer: Self-pay | Admitting: Interventional Cardiology

## 2016-12-22 ENCOUNTER — Ambulatory Visit (INDEPENDENT_AMBULATORY_CARE_PROVIDER_SITE_OTHER): Payer: Medicare Other | Admitting: Interventional Cardiology

## 2016-12-22 VITALS — BP 128/64 | HR 65 | Ht 70.0 in | Wt 193.8 lb

## 2016-12-22 DIAGNOSIS — I25810 Atherosclerosis of coronary artery bypass graft(s) without angina pectoris: Secondary | ICD-10-CM | POA: Diagnosis not present

## 2016-12-22 DIAGNOSIS — I480 Paroxysmal atrial fibrillation: Secondary | ICD-10-CM | POA: Diagnosis not present

## 2016-12-22 DIAGNOSIS — I495 Sick sinus syndrome: Secondary | ICD-10-CM

## 2016-12-22 DIAGNOSIS — I5032 Chronic diastolic (congestive) heart failure: Secondary | ICD-10-CM | POA: Diagnosis not present

## 2016-12-22 DIAGNOSIS — I10 Essential (primary) hypertension: Secondary | ICD-10-CM

## 2016-12-22 DIAGNOSIS — I739 Peripheral vascular disease, unspecified: Secondary | ICD-10-CM

## 2016-12-22 NOTE — Patient Instructions (Signed)
Medication Instructions:  None  Labwork: None  Testing/Procedures: None  Follow-Up: Your physician wants you to follow-up in: 6-9 months with Dr. Smith.  You will receive a reminder letter in the mail two months in advance. If you don't receive a letter, please call our office to schedule the follow-up appointment.   Any Other Special Instructions Will Be Listed Below (If Applicable).     If you need a refill on your cardiac medications before your next appointment, please call your pharmacy.   

## 2016-12-25 ENCOUNTER — Ambulatory Visit (INDEPENDENT_AMBULATORY_CARE_PROVIDER_SITE_OTHER): Payer: Medicare Other | Admitting: Neurology

## 2016-12-25 ENCOUNTER — Encounter: Payer: Self-pay | Admitting: Neurology

## 2016-12-25 VITALS — BP 110/60 | HR 73 | Ht 70.0 in | Wt 192.4 lb

## 2016-12-25 DIAGNOSIS — G253 Myoclonus: Secondary | ICD-10-CM | POA: Diagnosis not present

## 2016-12-25 DIAGNOSIS — R2681 Unsteadiness on feet: Secondary | ICD-10-CM | POA: Diagnosis not present

## 2016-12-25 DIAGNOSIS — I25810 Atherosclerosis of coronary artery bypass graft(s) without angina pectoris: Secondary | ICD-10-CM

## 2016-12-25 MED ORDER — CLONAZEPAM 0.25 MG PO TBDP: 0.2500 mg | ORAL_TABLET | Freq: Two times a day (BID) | ORAL | 5 refills | Status: DC

## 2016-12-25 NOTE — Progress Notes (Signed)
Follow-up Visit   Date: 12/25/16    Edward Woodward MRN: [id number] DOB: 05/06/1927   Interim History: Edward Woodward is a 83 y.o. right-handed Caucasian male with prostate cancer, non-Hodgkin's lymphoma on chemotherapy, depression, CAD status post CABG, atrial fibrillation on Coumadin, sinus node dysfunction s/p PPM, history of DVT and PE, and lumbar surgery for stenosis  returning to the clinic for follow-up of myoclonic jerks.  The patient was accompanied to the clinic by wife who also provides collateral information.    History of present illness: Patient has noticed a steady decline in function over the past year.  He was previously walking with a rollator and able to bath himself, but since early 2016, he has started to need assistance from his wife.  In mid-June, patient condition progressed where he was unable to walk, began falling more, hallucinating, limbs jerking, and felt increasingly weak.  He was admitted to [facility] on 6/18 -6/23 for confusion, arm shaking, and poor PO intake. Wife states that he had a lot of jerking of his arms and trembling of the leg.  Mental status change was secondary to toxic-metabolic in setting of electrolyte derangements (hypokalemia) and acute kidney injury.  CT of the abdomen was remarkable for interval progression of a retroperitoneal/periaortic mass with encasement of the aorta suggestive of retroperitoneal fibrosis, adenopathy, or lymphoma. Also noted on CT abdomen is bilateral hydronephrosis which has worsened since the prior study and is likely secondary to compression of the proximal ureters by the aforementioned mass. He was treated conservatively, repleted with electrotyles, and given IV hydration.  He eventually showed improvement and was discharged back to SNF, where he slowly noticed slight improvement of the abnormal movements. He has been at home since July 12th. He continues to have jerking movements of the hands, but overall  significantly improved from before.  He currently uses a walker and wheelchair.  He was previously using a rollator, but was told at rehab, he was moving too fast with it.  Since returning home, his confusion has improved.  He is able to feed himself.  His wife assists him for showers.  Over the last year, he has become much more forgetful.   He has previously been evaluated at [facility] in 2006 at which time he was diagnosed by EMG with multiple chronic severe radiculopathies with evidence of ongoing denervation in the bilateral gastrocnemius muscles and superimposed mild axonal polyneuropathy. There was concern for hardware slippage from his prior lumbar surgery and he was evaluated by his neurosurgeon, Dr. Ellene Route in [place], and was told he had 2 screws that were loose. He elected not to undergo further surgery at that time. More recently, he was evaluated in 2013 for falls and imbalance and diagnosed with idiopathic neuropathy and due to clinical symptoms suggestive of Parkinson's disease, also offered a trail of sinemet 25/100mg  TID.  Patient and his wife do not recall if he started sinemet or whether the diagnosis of PD was discussed.   UPDATE 06/26/2016:  He was diagnosed with lymphoma since he was last here and after undergoing chemotherapy, his wife feels that his mentation is improved, saying that they even had a conversation last week.  He remains forgetful about people and events.  He does not have delusional thoughts like before.  He noticed marked improvement of his myoclonic jerks since being on clonazepam 0.25mg  bid and now only has intermittent hand tremors.  He is able to feed himself and drink  coffee without making a mess.  He is getting home PT to help improve his gait and currently uses a walker and wheelchair.   UPDATE 12/25/2016:   Wife reduced his clonazepam to 0.125mg  and 0.25 at bedtime due to sedation and there has been no new abnormal movements.  His clonazepam  at bedtime helps with his leg cramps and pain at night.  His wife states that they are able to have conversations and he is able to bathe, dress, and feed himself.  He enjoys watching TV and plays a card game online. He walks with a walker and takes breaks as needed. 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  He has not suffered any falls.   Medications:  Current Outpatient Prescriptions on File Prior to Visit  Medication Sig Dispense Refill  . abiraterone Acetate (ZYTIGA) 250 MG tablet Take 4 tablets (1,000 mg total) by mouth daily. Take on an empty stomach 1 hour before or 2 hours after a meal 120 tablet 1  . acetaminophen (TYLENOL) 500 MG tablet Take 1,000 mg by mouth every 6 (six) hours as needed for mild pain, moderate pain or headache.     Marland Kitchen atorvastatin (LIPITOR) 40 MG tablet TAKE ONE TABLET BY MOUTH EVERY OTHER DAY 30 tablet 11  . citalopram (CELEXA) 20 MG tablet Take 20 mg by mouth at bedtime.     . fluticasone (FLONASE) 50 MCG/ACT nasal spray Place 1 spray into both nostrils daily as needed for allergies or rhinitis.     Marland Kitchen HYDROcodone-acetaminophen (NORCO/VICODIN) 5-325 MG tablet Take 1 tablet by mouth at bedtime as needed (for pain.).     Marland Kitchen hydrocortisone 2.5 % ointment Apply 1 application topically 2 (two) times daily as needed. Applied to affected area(s) of skin( pressure sores)    . isosorbide mononitrate (IMDUR) 30 MG 24 hr tablet Take 30 mg by mouth at bedtime.    . Lutein-Zeaxanthin 25-5 MG CAPS Take 1 capsule by mouth daily.     . magnesium hydroxide (MILK OF MAGNESIA) 400 MG/5ML suspension Take 5 mLs by mouth daily as needed for mild constipation.     . Melatonin 3 MG TABS Take 3 mg by mouth at bedtime.    . metoprolol succinate (TOPROL-XL) 50 MG 24 hr tablet Take 25 mg by mouth at bedtime. Take with or immediately following a meal.    . nitroGLYCERIN (NITROLINGUAL) 0.4 MG/SPRAY spray Place 1  spray under the tongue every 5 (five) minutes x 3 doses as needed for chest pain. 12 g 3  . ondansetron (ZOFRAN) 4 MG tablet Take 1 tablet (4 mg total) by mouth every 8 (eight) hours as needed for nausea or vomiting. 20 tablet 0  . Polyethyl Glycol-Propyl Glycol (SYSTANE) 0.4-0.3 % SOLN Place 1 drop into both eyes at bedtime.     . predniSONE (DELTASONE) 5 MG tablet TAKE 1 TABLET BY MOUTH ONCE DAILY 90 tablet 0  . torsemide (DEMADEX) 20 MG tablet Take 10 mg by mouth daily.     . traMADol (ULTRAM) 50 MG tablet Take 1 tablet (50 mg total) by mouth every 6 (six) hours as needed. 30 tablet 0  . triamcinolone ointment (KENALOG) 0.1 % Apply 1 application topically See admin instructions. Use once to twice daily applied to affected area(s) of skin.    Marland Kitchen vitamin B-12 (CYANOCOBALAMIN) 1000 MCG tablet Take 1,000 mcg by mouth at bedtime.     Marland Kitchen  warfarin (COUMADIN) 5 MG tablet Take 1 tablet (5 mg total) by mouth daily at 6 PM. 30 tablet 1   No current facility-administered medications on file prior to visit.     Allergies:  Allergies  Allergen Reactions  . Penicillins Hives, Shortness Of Breath and Other (See Comments)    Has patient had a PCN reaction causing immediate rash, facial/tongue/throat swelling, SOB or lightheadedness with hypotension: Yes Has patient had a PCN reaction causing severe rash involving mucus membranes or skin necrosis: No Has patient had a PCN reaction that required hospitalization No Has patient had a PCN reaction occurring within the last 10 years: No If all of the above answers are "NO", then may proceed with Cephalosporin use.   . Simvastatin Other (See Comments)    Reaction:  Muscle weakness    Review of Systems:  CONSTITUTIONAL: No fevers, chills, night sweats, or weight loss.  EYES: No visual changes or eye pain ENT: No hearing changes.  No history of nose bleeds.   RESPIRATORY: No cough, wheezing and shortness of breath.   CARDIOVASCULAR: Negative for chest pain,  and palpitations.   GI: Negative for abdominal discomfort, blood in stools or black stools.  No recent change in bowel habits.   GU:  No history of incontinence.   MUSCLOSKELETAL: +history of joint pain or swelling.  No myalgias.   SKIN: Negative for lesions, rash, and itching.   ENDOCRINE: Negative for cold or heat intolerance, polydipsia or goiter.   PSYCH:  No depression or anxiety symptoms.   NEURO: As Above.   Vital Signs:  BP 110/60   Pulse 73   Ht 5\' 10"  (1.778 m)   Wt 192 lb 7 oz (87.3 kg)   SpO2 96%   BMI 27.61 kg/m   Neurological Exam: MENTAL STATUS including orientation to time, place, person, recent and remote memory, attention span and concentration, language, and fund of knowledge is mostly intact.  He correctly identifies the date, time, year, and president.  Affect is blunted, but he smiles appropriately.  CRANIAL NERVES:  Pupils equal round and reactive to light.  Normal conjugate, extra-ocular eye movements in all directions of gaze.  No ptosis. Face is symmetric. Palate elevates symmetrically.  Tongue is midline.  MOTOR:  Motor strength is 5/5 in all extremities, including hip flexors.  No atrophy, fasciculations or myoclonic movements.  There is no tremor observed on exam.  There is no rigidity in the arms.    COORDINATION/GAIT:  Finger tapping and toe tapping is normal for age. Gait is assisted with walker which is improved as he was previously in a wheelchair.  He drags the left leg a little when walking and is stooped, with small steps but overall looks stable.   Data: CT head 01/12/2016: No acute intracranial hemorrhage. Age-related atrophy and chronic microvascular ischemic disease.   IMPRESSION/PLAN: 1.  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  Clinically, doing much better.  Recommend stopping morning dose of clonazepam.  He can continue clonazepam 0.25mg  at bedtime which also helps with leg pain.  Previously, I had considered  possibility of Parkinson's disease, however on exam today, there is no tremor, bradykinesia, or rigidity.  He has a blunted affected and stooped, slow gait, and I will continue to follow him to see how symptoms evolve but do not see any indication to start levadopa at this time.   2.  Dementia without behavior disturbance, clinically doing much better from that last visit suggesting possible delirium  at the past visit.  3.  Multifactorial gait instability due to deconditioning, neuropathy, lumbar degeneration.  Falls precautions were stressed at length because he takes coumadin for atrial fibrillation and history of PE/DVT.    4.  Non-Hodgkins lymphoma undergoing chemotherapy, followed by Dr. Benay Spice  Return to clinic in 6 months, or sooner as needed  The duration of this appointment visit was 25 minutes of face-to-face time with the patient.  Greater than 50% of this time was spent in counseling, explanation of diagnosis, planning of further management, and coordination of care.   Thank you for allowing me to participate in patient's care.  If I can answer any additional questions, I would be pleased to do so.    Sincerely,    Starlin Steib K. Posey Pronto, DO

## 2016-12-25 NOTE — Patient Instructions (Signed)
Reduce clonazepam to 1 tablet at bedtime  Return to clinic in 6 months

## 2017-01-03 ENCOUNTER — Other Ambulatory Visit: Payer: Self-pay | Admitting: Oncology

## 2017-01-04 ENCOUNTER — Ambulatory Visit (INDEPENDENT_AMBULATORY_CARE_PROVIDER_SITE_OTHER): Payer: Medicare Other | Admitting: *Deleted

## 2017-01-04 VITALS — HR 65

## 2017-01-04 DIAGNOSIS — Z45018 Encounter for adjustment and management of other part of cardiac pacemaker: Secondary | ICD-10-CM

## 2017-01-04 DIAGNOSIS — I495 Sick sinus syndrome: Secondary | ICD-10-CM

## 2017-01-04 LAB — CUP PACEART INCLINIC DEVICE CHECK
Implantable Lead Implant Date: 20081013
Implantable Lead Location: 753860
Implantable Lead Model: 4457
Implantable Lead Model: 4470
Implantable Lead Serial Number: 210110
Implantable Pulse Generator Implant Date: 20081013
Lead Channel Setting Pacing Amplitude: 2.4 V
Lead Channel Setting Pacing Pulse Width: 0.8 ms
Lead Channel Setting Sensing Sensitivity: 2.5 mV
MDC IDC LEAD IMPLANT DT: 20081013
MDC IDC LEAD LOCATION: 753859
MDC IDC LEAD SERIAL: 205344
MDC IDC PG SERIAL: 317986
MDC IDC SESS DTM: 20180611040000
MDC IDC SET LEADCHNL RA PACING AMPLITUDE: 2 V
MDC IDC STAT BRADY RA PERCENT PACED: 97 %
MDC IDC STAT BRADY RV PERCENT PACED: 5 %

## 2017-01-04 NOTE — Progress Notes (Signed)
Patient seen in clinic for pacemaker battery check.  No complaints of chest pain, shortness of breath, dizziness, palpitations.    Pulse 65   SpO2 94%   Current Meds  Medication Sig  . abiraterone Acetate (ZYTIGA) 250 MG tablet Take 4 tablets (1,000 mg total) by mouth daily. Take on an empty stomach 1 hour before or 2 hours after a meal  . acetaminophen (TYLENOL) 500 MG tablet Take 1,000 mg by mouth every 6 (six) hours as needed for mild pain, moderate pain or headache.   Marland Kitchen atorvastatin (LIPITOR) 40 MG tablet TAKE ONE TABLET BY MOUTH EVERY OTHER DAY  . citalopram (CELEXA) 20 MG tablet Take 20 mg by mouth at bedtime.   . clonazePAM (KLONOPIN) 0.25 MG disintegrating tablet Take 1 tablet (0.25 mg total) by mouth 2 (two) times daily.  . fluticasone (FLONASE) 50 MCG/ACT nasal spray Place 1 spray into both nostrils daily as needed for allergies or rhinitis.   Marland Kitchen HYDROcodone-acetaminophen (NORCO/VICODIN) 5-325 MG tablet Take 1 tablet by mouth at bedtime as needed (for pain.).   Marland Kitchen hydrocortisone 2.5 % ointment Apply 1 application topically 2 (two) times daily as needed. Applied to affected area(s) of skin( pressure sores)  . isosorbide mononitrate (IMDUR) 30 MG 24 hr tablet Take 30 mg by mouth at bedtime.  . Lutein-Zeaxanthin 25-5 MG CAPS Take 1 capsule by mouth daily.   . magnesium hydroxide (MILK OF MAGNESIA) 400 MG/5ML suspension Take 5 mLs by mouth daily as needed for mild constipation.   . Melatonin 3 MG TABS Take 3 mg by mouth at bedtime.  . metoprolol succinate (TOPROL-XL) 50 MG 24 hr tablet Take 25 mg by mouth at bedtime. Take with or immediately following a meal.  . nitroGLYCERIN (NITROLINGUAL) 0.4 MG/SPRAY spray Place 1 spray under the tongue every 5 (five) minutes x 3 doses as needed for chest pain.  Marland Kitchen ondansetron (ZOFRAN) 4 MG tablet Take 1 tablet (4 mg total) by mouth every 8 (eight) hours as needed for nausea or vomiting.  Vladimir Faster Glycol-Propyl Glycol (SYSTANE) 0.4-0.3 % SOLN Place 1  drop into both eyes at bedtime.   . predniSONE (DELTASONE) 5 MG tablet TAKE 1 TABLET BY MOUTH ONCE DAILY  . torsemide (DEMADEX) 20 MG tablet Take 10 mg by mouth daily.   . traMADol (ULTRAM) 50 MG tablet Take 1 tablet (50 mg total) by mouth every 6 (six) hours as needed.  . triamcinolone ointment (KENALOG) 0.1 % Apply 1 application topically See admin instructions. Use once to twice daily applied to affected area(s) of skin.  Marland Kitchen vitamin B-12 (CYANOCOBALAMIN) 1000 MCG tablet Take 1,000 mcg by mouth at bedtime.   Marland Kitchen warfarin (COUMADIN) 5 MG tablet Take 1 tablet (5 mg total) by mouth daily at 6 PM.    Device pocket well healed. Patient is not enrolled in remote monitoring (device not remote capable).  Device functioning normally at this time. No testing performed. Remaining longevity <0.5 years. No episodes recorded. ROV with [facility] on 02/25/17 for battery check (billable).  No programming changes made today.  Plan to follow up in 2 months with the [facility] for a battery check.  Mechele Dawley, BSN, RN, CCDS 01/04/2017 12:44 PM

## 2017-01-05 ENCOUNTER — Ambulatory Visit (HOSPITAL_BASED_OUTPATIENT_CLINIC_OR_DEPARTMENT_OTHER): Payer: Medicare Other | Admitting: Oncology

## 2017-01-05 ENCOUNTER — Ambulatory Visit: Payer: Medicare Other

## 2017-01-05 ENCOUNTER — Telehealth: Payer: Self-pay | Admitting: Oncology

## 2017-01-05 ENCOUNTER — Other Ambulatory Visit (HOSPITAL_BASED_OUTPATIENT_CLINIC_OR_DEPARTMENT_OTHER): Payer: Medicare Other

## 2017-01-05 VITALS — BP 152/54 | HR 69 | Temp 97.7°F | Resp 18 | Ht 70.0 in | Wt 193.6 lb

## 2017-01-05 DIAGNOSIS — C8299 Follicular lymphoma, unspecified, extranodal and solid organ sites: Secondary | ICD-10-CM

## 2017-01-05 DIAGNOSIS — Z8546 Personal history of malignant neoplasm of prostate: Secondary | ICD-10-CM

## 2017-01-05 DIAGNOSIS — D709 Neutropenia, unspecified: Secondary | ICD-10-CM | POA: Diagnosis not present

## 2017-01-05 DIAGNOSIS — Z7901 Long term (current) use of anticoagulants: Secondary | ICD-10-CM

## 2017-01-05 DIAGNOSIS — I2699 Other pulmonary embolism without acute cor pulmonale: Secondary | ICD-10-CM | POA: Diagnosis not present

## 2017-01-05 DIAGNOSIS — C61 Malignant neoplasm of prostate: Secondary | ICD-10-CM

## 2017-01-05 DIAGNOSIS — Z86718 Personal history of other venous thrombosis and embolism: Secondary | ICD-10-CM

## 2017-01-05 DIAGNOSIS — I25810 Atherosclerosis of coronary artery bypass graft(s) without angina pectoris: Secondary | ICD-10-CM

## 2017-01-05 LAB — PROTIME-INR
INR: 2.5 (ref 2.00–3.50)
PROTIME: 30 s — AB (ref 10.6–13.4)

## 2017-01-05 LAB — COMPREHENSIVE METABOLIC PANEL
ALT: 10 U/L (ref 0–55)
ANION GAP: 10 meq/L (ref 3–11)
AST: 16 U/L (ref 5–34)
Albumin: 3.4 g/dL — ABNORMAL LOW (ref 3.5–5.0)
Alkaline Phosphatase: 51 U/L (ref 40–150)
BUN: 21.7 mg/dL (ref 7.0–26.0)
CALCIUM: 9.4 mg/dL (ref 8.4–10.4)
CO2: 24 mEq/L (ref 22–29)
CREATININE: 1.5 mg/dL — AB (ref 0.7–1.3)
Chloride: 108 mEq/L (ref 98–109)
EGFR: 41 mL/min/{1.73_m2} — AB (ref >90)
Glucose: 116 mg/dl (ref 70–140)
Potassium: 4 mEq/L (ref 3.5–5.1)
Sodium: 142 mEq/L (ref 136–145)
Total Bilirubin: 0.52 mg/dL (ref 0.20–1.20)
Total Protein: 6 g/dL — ABNORMAL LOW (ref 6.4–8.3)

## 2017-01-05 LAB — CBC WITH DIFFERENTIAL/PLATELET
BASO%: 1 % (ref 0.0–2.0)
BASOS ABS: 0 10*3/uL (ref 0.0–0.1)
EOS ABS: 0.3 10*3/uL (ref 0.0–0.5)
EOS%: 9.5 % — ABNORMAL HIGH (ref 0.0–7.0)
HEMATOCRIT: 32.5 % — AB (ref 38.4–49.9)
HGB: 10.6 g/dL — ABNORMAL LOW (ref 13.0–17.1)
LYMPH#: 1.2 10*3/uL (ref 0.9–3.3)
LYMPH%: 40 % (ref 14.0–49.0)
MCH: 30 pg (ref 27.2–33.4)
MCHC: 32.6 g/dL (ref 32.0–36.0)
MCV: 92.1 fL (ref 79.3–98.0)
MONO#: 0.8 10*3/uL (ref 0.1–0.9)
MONO%: 26.1 % — ABNORMAL HIGH (ref 0.0–14.0)
NEUT#: 0.7 10*3/uL — ABNORMAL LOW (ref 1.5–6.5)
NEUT%: 23.4 % — AB (ref 39.0–75.0)
PLATELETS: 160 10*3/uL (ref 140–400)
RBC: 3.53 10*6/uL — ABNORMAL LOW (ref 4.20–5.82)
RDW: 14.6 % (ref 11.0–14.6)
WBC: 3 10*3/uL — ABNORMAL LOW (ref 4.0–10.3)

## 2017-01-05 NOTE — Telephone Encounter (Signed)
Gave patient avs report and appointments for June and July.  °

## 2017-01-05 NOTE — Progress Notes (Signed)
Nutrition  Planning to complete nutrition follow-up during infusion but patient did not have infusion today.  Will follow-up as able.  Summers Buendia B. Zenia Resides, [place], [place] Registered Dietitian [phone] (pager)

## 2017-01-05 NOTE — Progress Notes (Signed)
[place] OFFICE PROGRESS NOTE   Diagnosis: Prostate cancer, lymphoma  INTERVAL HISTORY:   Edward Woodward returns as scheduled. He reports malaise today. He has felt well over the past several months. Good appetite. He continues abiraterone and prednisone.  Objective:  Vital signs in last 24 hours:  Blood pressure (!) 152/54, pulse 69, temperature 97.7 F (36.5 C), temperature source Oral, resp. rate 18, height 5\' 10"  (1.778 m), weight 193 lb 9.6 oz (87.8 kg), SpO2 98 %.    HEENT: No thrush or ulcers Lymphatics: No cervical, supraclavicular, axillary, or inguinal nodes Resp: Lungs clear bilaterally Cardio: Regular rate and rhythm GI: No hepatosplenomegaly Vascular: No leg edema      Lab Results:  Lab Results  Component Value Date   WBC 3.0 (L) 01/05/2017   HGB 10.6 (L) 01/05/2017   HCT 32.5 (L) 01/05/2017   MCV 92.1 01/05/2017   PLT 160 01/05/2017   NEUTROABS 0.7 (L) 01/05/2017    CMP     Component Value Date/Time   NA 142 01/05/2017 1018   K 4.0 01/05/2017 1018   CL 107 10/07/2016 1050   CL 105 05/31/2012 1058   CO2 24 01/05/2017 1018   GLUCOSE 116 01/05/2017 1018   GLUCOSE 85 05/31/2012 1058   BUN 21.7 01/05/2017 1018   CREATININE 1.5 (H) 01/05/2017 1018   CALCIUM 9.4 01/05/2017 1018   PROT 6.0 (L) 01/05/2017 1018   ALBUMIN 3.4 (L) 01/05/2017 1018   AST 16 01/05/2017 1018   ALT 10 01/05/2017 1018   ALKPHOS 51 01/05/2017 1018   BILITOT 0.52 01/05/2017 1018   GFRNONAA 41 (L) 10/07/2016 1050   GFRAA 48 (L) 10/07/2016 1050     Lab Results  Component Value Date   INR 2.50 01/05/2017     Medications: I have reviewed the patient's current medications.  Assessment/Plan: 1. Non-Hodgkin's lymphoma (follicular grade 3 lymphoma) - status post 6 cycles of Cytoxan/prednisone/rituximab 07/01/2007 through 10/21/2007.   Biopsy of a retroperitoneal mass 05/13/2016 consistent with involvement by low-grade non-Hodgkin's lymphoma  Initiation of  weekly Rituxan 06/17/2016; week 4given 07/09/2016.  CT abdomen/pelvis 09/15/2016-improvement in retroperitoneal and pelvic soft tissue  Initiation of maintenance Rituxan every 3 months 10/13/2016 (2 year course planned) 2. CT chest 04/01/2015 with no lymphadenopathy  CT abdomen/pelvis 05/01/2015 with mild progression of abdomen/pelvic lymphadenopathy 3. Prostate cancer - he has been treated with hormonal therapy, the PSA was stable on 10/15/2014  PSA increased 04/26/2015   Bone scan 05/09/2015 with unchanged increased activity in the thoracic and lumbar spine felt to most likely be degenerative.   Initiation of Abiraterone and prednisone 05/11/2015. Normalization of the PSA on Abiraterone. 4. History of a normocytic anemia secondary to non-Hodgkin's lymphoma, chemotherapy, and renal insufficiency - progressive secondary to GI bleeding, stable 5. History of Mild thrombocytopenia  6. History of bilateral hydronephrosis. Renal ultrasound 09/26/2014 consistent with medical renal disease. No hydronephrosis. Ureter stents removed 10/09/2016. 7. Coronary artery disease - status post coronary artery stent placement. 8. History of noncalcified lung nodules. 9. History of severe neutropenia July 2009 - likely related to delayed toxicity from rituximab. 10. Macular degeneration. 11. Right middle lobe density on a CT of the chest 06/18/2010 measuring 2.5 x 1.6 cm with mildly increased metabolic activity (SUV max 2.5) on a PET scan 06/27/2010. The area of increased density was less confluent and appeared smaller on a restaging CT 09/08/2010. Right middle lobe "scarring" with no new or suspicious pulmonary nodule on a CT 08/04/2011. 12. Mild  increased metabolic activity associated with several lymph nodes on the PET scan 06/27/2010 - likely related to lymphoma. No lymphadenopathy in the mediastinum or axillary areas on the CT 08/04/2011. 13. Right hip discomfort-he received a steroid injection by his  primary physician without improvement. He saw Dr. Collier Salina and there was a question of a lytic process in the right pelvis. A bone scan on 09/21/2012 revealed no evidence of metastatic disease. 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 Renal insufficiency-progressive CT 01/12/2016 confirmed progressive hydronephrosis  Placement of bilateral ureter stents 04/10/2016  Ureter stents removed 10/09/2016 15. Left leg DVT and pulmonary embolism 04/01/2015-on Coumadin 16. Admission 08/23/2015 with severe anemia and GI bleeding  Upper and lower endoscopy 08/26/2015 without a clear source for bleeding identified 17. Leg edema/anasarca-improved with leg wrapping and diuretics 18. Anemia secondary to chronic disease, renal failure, and possibly lymphoma 19. 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   Biopsy retroperitoneal soft tissue 05/13/2016 20. History of elevated calcium-most likely secondary to non-Hodgkin's lymphoma status post pamidronate 06/08/2016, calcitonin 06/11/2016    Disposition:  His overall status appears unchanged and remains much improved compared to when he started rituximab therapy in November 2017. He has developed neutropenia. This is likely related to delayed toxicity from rituximab. I doubt the neutropenia is secondary to the abiraterone.  We decided to hold scheduled rituximab today. He will return for a CBC in 2 weeks. He will contact us for a fever or symptoms of an infection. Edward Woodward will be scheduled for an office visit and rituximab in 4 weeks.  25 minutes were spent with the patient today. The majority of the time was used for counseling and coordination of care.    Donneta Romberg, MD  01/05/2017  11:53 AM

## 2017-01-06 LAB — PSA: Prostate Specific Ag, Serum: 0.5 ng/mL (ref 0.0–4.0)

## 2017-01-15 ENCOUNTER — Other Ambulatory Visit: Payer: Self-pay | Admitting: *Deleted

## 2017-01-15 ENCOUNTER — Other Ambulatory Visit: Payer: Self-pay | Admitting: Internal Medicine

## 2017-01-15 NOTE — Patient Outreach (Signed)
[facility]) Care Management  01/15/2017  COURVOISIER HAMBLEN 03-13-1927 [id number]  Telephone Screen  Referral Date: Referral Source: Referral Reason: Insurance:   Outreach attempt #1 to patient. No answer. RN CM left HIPAA compliant message along with contact info.     Plan: RNCM will attempt to contact patient within the next 3 business days.  Lake Bells, RN, BSN, MHA/MSL, [place] Telephonic Care Manager Coordinator [facility] Direct Phone: [phone] Toll Free: [phone] Fax: [phone]

## 2017-01-19 ENCOUNTER — Other Ambulatory Visit (HOSPITAL_BASED_OUTPATIENT_CLINIC_OR_DEPARTMENT_OTHER): Payer: Medicare Other

## 2017-01-19 DIAGNOSIS — D709 Neutropenia, unspecified: Secondary | ICD-10-CM | POA: Diagnosis not present

## 2017-01-19 DIAGNOSIS — Z8546 Personal history of malignant neoplasm of prostate: Secondary | ICD-10-CM | POA: Diagnosis not present

## 2017-01-19 DIAGNOSIS — I2699 Other pulmonary embolism without acute cor pulmonale: Secondary | ICD-10-CM

## 2017-01-19 DIAGNOSIS — Z86718 Personal history of other venous thrombosis and embolism: Secondary | ICD-10-CM | POA: Diagnosis present

## 2017-01-19 DIAGNOSIS — C8299 Follicular lymphoma, unspecified, extranodal and solid organ sites: Secondary | ICD-10-CM

## 2017-01-19 LAB — CBC WITH DIFFERENTIAL/PLATELET
BASO%: 0.3 % (ref 0.0–2.0)
Basophils Absolute: 0 10*3/uL (ref 0.0–0.1)
EOS%: 5.9 % (ref 0.0–7.0)
Eosinophils Absolute: 0.7 10*3/uL — ABNORMAL HIGH (ref 0.0–0.5)
HEMATOCRIT: 33.9 % — AB (ref 38.4–49.9)
HEMOGLOBIN: 10.9 g/dL — AB (ref 13.0–17.1)
LYMPH#: 2.3 10*3/uL (ref 0.9–3.3)
LYMPH%: 20.6 % (ref 14.0–49.0)
MCH: 29.9 pg (ref 27.2–33.4)
MCHC: 32.2 g/dL (ref 32.0–36.0)
MCV: 92.9 fL (ref 79.3–98.0)
MONO#: 1 10*3/uL — ABNORMAL HIGH (ref 0.1–0.9)
MONO%: 9 % (ref 0.0–14.0)
NEUT#: 7.2 10*3/uL — ABNORMAL HIGH (ref 1.5–6.5)
NEUT%: 64.2 % (ref 39.0–75.0)
Platelets: 166 10*3/uL (ref 140–400)
RBC: 3.65 10*6/uL — ABNORMAL LOW (ref 4.20–5.82)
RDW: 14.7 % — AB (ref 11.0–14.6)
WBC: 11.2 10*3/uL — AB (ref 4.0–10.3)

## 2017-01-20 ENCOUNTER — Ambulatory Visit: Payer: Self-pay | Admitting: *Deleted

## 2017-01-20 ENCOUNTER — Other Ambulatory Visit: Payer: Self-pay | Admitting: *Deleted

## 2017-01-20 ENCOUNTER — Telehealth: Payer: Self-pay | Admitting: *Deleted

## 2017-01-20 NOTE — Telephone Encounter (Signed)
Spoke with pt's wife, informed of normal neutrophils. She voiced understanding.

## 2017-01-20 NOTE — Telephone Encounter (Signed)
-----   Message from Ladell Pier, MD sent at 01/19/2017  8:04 PM EDT ----- Please call wife, neutrophils are now normal, f/u as scheduled

## 2017-01-20 NOTE — Patient Outreach (Signed)
[facility]) Care Management  01/20/2017  Edward Woodward 19-Aug-1926 [id number]   Telephone Screen  Referral Date: 01/20/17 Referral Source: EMMI Prevent Referral Reason: Cancer, Heart disease Insurance: Medicare, [place] attempt # 2 spoke with patient and HIPAA verified. Patient requested RN CM [place]) speak with his wife [name]).  Social:  Patient lives with his wife. He is independent/assist with ADLs. Patient's wife transports him to all medical appointments. Patient uses a RW and wheelchair.  Conditions: Past Medical Hx: Prostate Cancer, Non-Hodgkins Lymphoma, Anemia, CAD, CHF, Collapsed Lung, Depressive Disorder, OSA, Neuropathy Spouse reported, patient is having difficulty with maintaining his balance. Patient had more than 2 falls within the last 3 months. He had numerous of falls within the last year. Patient has developed several wounds/skin tears from the falls. Spouse stated, "She is managing patient's wounds/skin tears the best that she can".  Patient is scheduled for a chemotherapy treatment. Patient experiences dizziness and hypotension, intermittently.   Medications: Spouse reported, patient takes greater than 10 meds per day. Spouse reported, patient being able to afford his medications and taking them as prescribed. Spouse had no questions or concerns about patient's meds. Spouse verbalized, she continues to collaborate with patient's doctors about patient's medications to prevent any adverse effects. Patient is receiving assistance with [employer] through [employer].  Appointments: Patient has an upcoming appointment on 02/10/17 with oncologist.   Advanced Directives: Spouse verbalized patient having a living will and HPOA.  Consent: [facility] services reviewed and discussed with spouse. She agreed to services.   Plan: RN CM advised patient that [facility] community RN CM would follow up and contact patient within the next 10 days RN CM will  send [facility] SW referral for possible assistance with community resources. RN CM advised patient to contact RN CM for any needs or concerns. RN CM provided patient with [facility] 24hr Nurse Line contact info.  Lake Bells, RN, BSN, MHA/MSL, [place] Telephonic Care Manager Coordinator [facility] Direct Phone: [phone] Toll Free: [phone] Fax: [phone]

## 2017-01-22 ENCOUNTER — Other Ambulatory Visit: Payer: Self-pay | Admitting: Internal Medicine

## 2017-01-25 ENCOUNTER — Encounter: Payer: Self-pay | Admitting: *Deleted

## 2017-01-26 DIAGNOSIS — N183 Chronic kidney disease, stage 3 (moderate): Secondary | ICD-10-CM | POA: Diagnosis not present

## 2017-01-26 DIAGNOSIS — C61 Malignant neoplasm of prostate: Secondary | ICD-10-CM | POA: Diagnosis not present

## 2017-01-26 DIAGNOSIS — I129 Hypertensive chronic kidney disease with stage 1 through stage 4 chronic kidney disease, or unspecified chronic kidney disease: Secondary | ICD-10-CM | POA: Diagnosis not present

## 2017-01-26 DIAGNOSIS — I509 Heart failure, unspecified: Secondary | ICD-10-CM | POA: Diagnosis not present

## 2017-01-26 DIAGNOSIS — F321 Major depressive disorder, single episode, moderate: Secondary | ICD-10-CM | POA: Diagnosis not present

## 2017-01-26 DIAGNOSIS — I2510 Atherosclerotic heart disease of native coronary artery without angina pectoris: Secondary | ICD-10-CM | POA: Diagnosis not present

## 2017-01-26 NOTE — Telephone Encounter (Signed)
This encounter was created in error - please disregard.

## 2017-01-29 ENCOUNTER — Other Ambulatory Visit: Payer: Self-pay | Admitting: *Deleted

## 2017-01-29 NOTE — Patient Outreach (Signed)
[facility]) Care Management  01/29/2017  MUSSA GROESBECK [birth year] [id number]   CSW received referral 6/28 from [place], [place] for possible safety evaluation as patient has fallen 2 times in the past 3 months. CSW called & spoke with patient's wife, [name] [phone]) to discuss referral. Patient's wife reports that patient had been having issues with his balance due to neuropathy but has since complete physical therapy and has been doing much better. Patient's wife reports that she and patient go on walks together when its not too hot outside and it's very rare that he falls anymore. Patient's wife declined CSW need/visit at this time. CSW will close out case & send message to PCP.    Raynaldo Opitz, LCSW [facility]  Clinical Social Worker cell #: [phone]

## 2017-02-02 ENCOUNTER — Other Ambulatory Visit (HOSPITAL_BASED_OUTPATIENT_CLINIC_OR_DEPARTMENT_OTHER): Payer: Medicare Other

## 2017-02-02 ENCOUNTER — Ambulatory Visit: Payer: Medicare Other

## 2017-02-02 ENCOUNTER — Ambulatory Visit (HOSPITAL_BASED_OUTPATIENT_CLINIC_OR_DEPARTMENT_OTHER): Payer: Medicare Other | Admitting: Nurse Practitioner

## 2017-02-02 ENCOUNTER — Ambulatory Visit (HOSPITAL_BASED_OUTPATIENT_CLINIC_OR_DEPARTMENT_OTHER): Payer: Medicare Other

## 2017-02-02 VITALS — BP 143/57 | HR 65 | Temp 97.5°F | Resp 18

## 2017-02-02 VITALS — BP 127/50 | HR 65 | Temp 98.0°F | Resp 18 | Ht 70.0 in | Wt 192.8 lb

## 2017-02-02 DIAGNOSIS — C8299 Follicular lymphoma, unspecified, extranodal and solid organ sites: Secondary | ICD-10-CM

## 2017-02-02 DIAGNOSIS — K922 Gastrointestinal hemorrhage, unspecified: Secondary | ICD-10-CM | POA: Diagnosis not present

## 2017-02-02 DIAGNOSIS — Z5112 Encounter for antineoplastic immunotherapy: Secondary | ICD-10-CM

## 2017-02-02 DIAGNOSIS — I2699 Other pulmonary embolism without acute cor pulmonale: Secondary | ICD-10-CM | POA: Diagnosis not present

## 2017-02-02 DIAGNOSIS — Z7901 Long term (current) use of anticoagulants: Secondary | ICD-10-CM

## 2017-02-02 DIAGNOSIS — Z8546 Personal history of malignant neoplasm of prostate: Secondary | ICD-10-CM | POA: Diagnosis not present

## 2017-02-02 DIAGNOSIS — D500 Iron deficiency anemia secondary to blood loss (chronic): Secondary | ICD-10-CM | POA: Diagnosis not present

## 2017-02-02 DIAGNOSIS — C8583 Other specified types of non-Hodgkin lymphoma, intra-abdominal lymph nodes: Secondary | ICD-10-CM

## 2017-02-02 DIAGNOSIS — C61 Malignant neoplasm of prostate: Secondary | ICD-10-CM

## 2017-02-02 LAB — BASIC METABOLIC PANEL
ANION GAP: 6 meq/L (ref 3–11)
BUN: 27.6 mg/dL — AB (ref 7.0–26.0)
CHLORIDE: 108 meq/L (ref 98–109)
CO2: 24 meq/L (ref 22–29)
Calcium: 9.8 mg/dL (ref 8.4–10.4)
Creatinine: 1.4 mg/dL — ABNORMAL HIGH (ref 0.7–1.3)
EGFR: 46 mL/min/{1.73_m2} — ABNORMAL LOW (ref >90)
GLUCOSE: 118 mg/dL (ref 70–140)
POTASSIUM: 4.6 meq/L (ref 3.5–5.1)
Sodium: 138 mEq/L (ref 136–145)

## 2017-02-02 LAB — CBC WITH DIFFERENTIAL/PLATELET
BASO%: 0.5 % (ref 0.0–2.0)
BASOS ABS: 0 10*3/uL (ref 0.0–0.1)
EOS%: 4.5 % (ref 0.0–7.0)
Eosinophils Absolute: 0.4 10*3/uL (ref 0.0–0.5)
HEMATOCRIT: 32 % — AB (ref 38.4–49.9)
HGB: 10.6 g/dL — ABNORMAL LOW (ref 13.0–17.1)
LYMPH%: 18.8 % (ref 14.0–49.0)
MCH: 30.2 pg (ref 27.2–33.4)
MCHC: 33.1 g/dL (ref 32.0–36.0)
MCV: 91.2 fL (ref 79.3–98.0)
MONO#: 0.7 10*3/uL (ref 0.1–0.9)
MONO%: 8 % (ref 0.0–14.0)
NEUT#: 5.8 10*3/uL (ref 1.5–6.5)
NEUT%: 68.2 % (ref 39.0–75.0)
PLATELETS: 149 10*3/uL (ref 140–400)
RBC: 3.51 10*6/uL — AB (ref 4.20–5.82)
RDW: 15.4 % — ABNORMAL HIGH (ref 11.0–14.6)
WBC: 8.4 10*3/uL (ref 4.0–10.3)
lymph#: 1.6 10*3/uL (ref 0.9–3.3)

## 2017-02-02 LAB — PROTIME-INR
INR: 3.2 (ref 2.00–3.50)
Protime: 38.4 Seconds — ABNORMAL HIGH (ref 10.6–13.4)

## 2017-02-02 MED ORDER — ACETAMINOPHEN 325 MG PO TABS
650.0000 mg | ORAL_TABLET | Freq: Once | ORAL | Status: AC
  Administered 2017-02-02: 650 mg via ORAL

## 2017-02-02 MED ORDER — ACETAMINOPHEN 325 MG PO TABS
ORAL_TABLET | ORAL | Status: AC
  Filled 2017-02-02: qty 2

## 2017-02-02 MED ORDER — SODIUM CHLORIDE 0.9 % IV SOLN
375.0000 mg/m2 | Freq: Once | INTRAVENOUS | Status: AC
  Administered 2017-02-02: 800 mg via INTRAVENOUS
  Filled 2017-02-02: qty 80

## 2017-02-02 MED ORDER — DIPHENHYDRAMINE HCL 25 MG PO CAPS
ORAL_CAPSULE | ORAL | Status: AC
  Filled 2017-02-02: qty 2

## 2017-02-02 MED ORDER — DIPHENHYDRAMINE HCL 25 MG PO CAPS
50.0000 mg | ORAL_CAPSULE | Freq: Once | ORAL | Status: AC
  Administered 2017-02-02: 50 mg via ORAL

## 2017-02-02 MED ORDER — SODIUM CHLORIDE 0.9 % IV SOLN
Freq: Once | INTRAVENOUS | Status: AC
  Administered 2017-02-02: 12:00:00 via INTRAVENOUS

## 2017-02-02 NOTE — Progress Notes (Signed)
Nutrition Follow-up:  Met with patient during infusion of rituxan this pm in infusion area.  Patient with prostate cancer and lymphoma.    Wife was not present during visit.  Woke patient up at the beginning of visit and very drowsy during visit.  Patient reports that his appetite is good. Reports that he normally eats cereal with fruit for breakfast, sandwich for lunch and meat and vegetables for dinner.  Noted for lunch today ate few bites of sandwich and few bites of ice cream.  Patient reports he did not really want to eat and did not like the bread on the sandwich.  Reports that he drinks premier protein shakes.  No report of nausea, constipation or diarrhea  Medications: reviewed  Labs: reviewed  Anthropometrics:   Weight has decreased to 192 lb 12.8 oz today from 197 lb on 06/17/16 visit.     NUTRITION DIAGNOSIS: Inadequate oral intake continues   MALNUTRITION DIAGNOSIS: Severe malnutrition continues   INTERVENTION:   Discussed strategies for increasing calories and protein.  Encouraged patient to continue to use oral nutrition supplement shake for added calories and protein.  Will need follow-up as patient so sleepy during visit today.    MONITORING, EVALUATION, GOAL: Patient will tolerate increased calories and protein to prevent further weight loss   NEXT VISIT: as needed  Elisabel Hanover B. Zenia Resides, [place], [place] Registered Dietitian [phone] (pager)

## 2017-02-02 NOTE — Patient Instructions (Signed)
[facility] Discharge Instructions for Patients Receiving Chemotherapy  Today you received the following chemotherapy agents: Rituxan   To help prevent nausea and vomiting after your treatment, we encourage you to take your nausea medication as directed.    If you develop nausea and vomiting that is not controlled by your nausea medication, call the clinic.   BELOW ARE SYMPTOMS THAT SHOULD BE REPORTED IMMEDIATELY:  *FEVER GREATER THAN 100.5 F  *CHILLS WITH OR WITHOUT FEVER  NAUSEA AND VOMITING THAT IS NOT CONTROLLED WITH YOUR NAUSEA MEDICATION  *UNUSUAL SHORTNESS OF BREATH  *UNUSUAL BRUISING OR BLEEDING  TENDERNESS IN MOUTH AND THROAT WITH OR WITHOUT PRESENCE OF ULCERS  *URINARY PROBLEMS  *BOWEL PROBLEMS  UNUSUAL RASH Items with * indicate a potential emergency and should be followed up as soon as possible.  Feel free to call the clinic you have any questions or concerns. The clinic phone number is [phone].  Please show the CHEMO ALERT CARD at check-in to the Emergency Department and triage nurse.   

## 2017-02-02 NOTE — Progress Notes (Signed)
[facility] OFFICE PROGRESS NOTE   Diagnosis:  Prostate cancer, lymphoma  INTERVAL HISTORY:   Edward Woodward returns as scheduled. He continues Abiraterone and prednisone. Energy level has improved. He had a single fever about a month ago. He is having no sweats. Good appetite. His wife notes bruises scattered over the forearms. No other bleeding. No nausea or vomiting. Bowels moving regularly.  Objective:  Vital signs in last 24 hours:  Blood pressure (!) 127/50, pulse 65, temperature 98 F (36.7 C), temperature source Oral, resp. rate 18, height 5\' 10"  (1.778 m), weight 192 lb 12.8 oz (87.5 kg), SpO2 100 %.    HEENT: No thrush or ulcers. Lymphatics: No palpable cervical or supraclavicular lymph nodes. Resp: Lungs clear bilaterally. Cardio: Regular rate and rhythm. GI: No hepatosplenomegaly. Vascular: No leg edema.   Lab Results:  Lab Results  Component Value Date   WBC 8.4 02/02/2017   HGB 10.6 (L) 02/02/2017   HCT 32.0 (L) 02/02/2017   MCV 91.2 02/02/2017   PLT 149 02/02/2017   NEUTROABS 5.8 02/02/2017    Imaging:  No results found.  Medications: I have reviewed the patient's current medications.  Assessment/Plan: 1. Non-Hodgkin's lymphoma (follicular grade 3 lymphoma) - status post 6 cycles of Cytoxan/prednisone/rituximab 07/01/2007 through 10/21/2007.   Biopsy of a retroperitoneal mass 05/13/2016 consistent with involvement by low-grade non-Hodgkin's lymphoma  Initiation of weekly Rituxan 06/17/2016; week 4given 07/09/2016.  CT abdomen/pelvis 09/15/2016-improvement in retroperitoneal and pelvic soft tissue  Initiation of maintenance Rituxan every 3 months 10/13/2016 (2 year course planned) 2. CT chest 04/01/2015 with no lymphadenopathy  CT abdomen/pelvis 05/01/2015 with mild progression of abdomen/pelvic lymphadenopathy 3. Prostate cancer - he has been treated with hormonal therapy, the PSA was stable on 10/15/2014  PSA increased  04/26/2015   Bone scan 05/09/2015 with unchanged increased activity in the thoracic and lumbar spine felt to most likely be degenerative.   Initiation of Abiraterone and prednisone 05/11/2015. Normalization of the PSA on Abiraterone. 4. History of a normocytic anemia secondary to non-Hodgkin's lymphoma, chemotherapy, and renal insufficiency - progressive secondary to GI bleeding, stable 5. History of Mild thrombocytopenia  6. History of bilateral hydronephrosis. Renal ultrasound 09/26/2014 consistent with medical renal disease. No hydronephrosis. Ureter stents removed 10/09/2016. 7. Coronary artery disease - status post coronary artery stent placement. 8. History of noncalcified lung nodules. 9. History of severe neutropenia July 2009 - likely related to delayed toxicity from rituximab. 10. Macular degeneration. 11. Right middle lobe density on a CT of the chest 06/18/2010 measuring 2.5 x 1.6 cm with mildly increased metabolic activity (SUV max 2.5) on a PET scan 06/27/2010. The area of increased density was less confluent and appeared smaller on a restaging CT 09/08/2010. Right middle lobe "scarring" with no new or suspicious pulmonary nodule on a CT 08/04/2011. 12. Mild increased metabolic activity associated with several lymph nodes on the PET scan 06/27/2010 - likely related to lymphoma. No lymphadenopathy in the mediastinum or axillary areas on the CT 08/04/2011. 13. Right hip discomfort-he received a steroid injection by his primary physician without improvement. He saw Dr. Collier Salina and there was a question of a lytic process in the right pelvis. A bone scan on 09/21/2012 revealed no evidence of metastatic disease. 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 Renal insufficiency-progressive CT 01/12/2016 confirmed progressive hydronephrosis  Placement of bilateral ureter stents 04/10/2016  Ureter stents removed 10/09/2016 15. Left leg DVT and pulmonary  embolism 04/01/2015-on Coumadin 16. Admission 08/23/2015  with severe anemia and GI bleeding  Upper and lower endoscopy 08/26/2015 without a clear source for bleeding identified 17. Leg edema/anasarca-improved with leg wrapping and diuretics 18. Anemia secondary to chronic disease, renal failure, and possibly lymphoma 19. 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   Biopsy retroperitoneal soft tissue 05/13/2016 20. History of elevated calcium-most likely secondary to non-Hodgkin's lymphoma status post pamidronate 06/08/2016, calcitonin 06/11/2016     Disposition: Edward Woodward appears unchanged. The neutropenia has resolved. Plan to proceed with Rituxan today as scheduled.  He will continue Abiraterone and prednisone. We will check the PSA at the time of his next visit.  The PT/INR is supratherapeutic. Coumadin dose will be adjusted from 5 mg daily to 5 mg all days except 2.5 mg on Mondays, Wednesdays and Fridays. He will return for a repeat PT/INR in 2 weeks.  He will return for a follow-up visit and Rituxan in 3 months. He will contact the office in the interim with any problems.  Plan reviewed with Dr. Benay Spice.      [name] ANP/GNP-BC   02/02/2017  11:42 AM

## 2017-02-02 NOTE — Progress Notes (Signed)
At 1405 Rituxan stopped, patient complaining of feeling like he is going the throw up and that Rituxan is going too fast. See vital sign flow sheet. Then patient started complaining of chest discomfort/pain at 1409. IV normal saline started via gravity then placed on pump. Dr. Benay Spice called and reported chest discomfort, instructed from Dr. Benay Spice to stop Rituxan and wait for symptoms to resolve. Ask patient if he felt like it was his heart and he said no. Patient drank soda and burped and states that he feels better at 1423, chest pain resolved. Restarted Rituxan at 100 ml/hr at 1430, rechecked vital signs at 1500 and left at 100 ml/hr until completed, patient states that he felt like it was going too fast earlier. No further complaints.

## 2017-02-03 ENCOUNTER — Telehealth: Payer: Self-pay | Admitting: Oncology

## 2017-02-03 NOTE — Telephone Encounter (Signed)
°  S/w pt, gave appt for 7/24 + 04/27/17. Pt's wife reports the pt was sick after taking rituxan. She thinks this was due to either how quickly the pt was infused or because of the dosage. Msg to RN to call pt's wife to discuss.

## 2017-02-04 ENCOUNTER — Other Ambulatory Visit: Payer: Self-pay | Admitting: *Deleted

## 2017-02-04 NOTE — Patient Outreach (Signed)
[facility]) Care Management  02/04/2017  AVYN COATE [birth year] [id number]   RN attempted outreach call today however was unsuccessful. RN able to leave a HIPAA approved voice message requesting a call back. Will continue outreach calls accordingly pending [facility] services.  Raina Mina, RN Care Management Coordinator [place] Office [phone]

## 2017-02-08 ENCOUNTER — Other Ambulatory Visit: Payer: Self-pay | Admitting: Oncology

## 2017-02-12 ENCOUNTER — Other Ambulatory Visit: Payer: Self-pay | Admitting: *Deleted

## 2017-02-12 NOTE — Patient Outreach (Signed)
[facility]) Care Management  02/12/2017  TRAMEL WESTBROOK 06-Dec-1926 [id number]   RN second outreach attempt unsuccessful. RN able to leave a HIPAA voice message requesting a call back. Will rescheduled another follow up call next week.  Raina Mina, RN Care Management Coordinator [place] Office [phone]

## 2017-02-12 NOTE — Patient Outreach (Deleted)
[facility]) Care Management  02/12/2017  ILHAN MADAN Jun 19, 1927 [id number]  RN second outreach attempt however unsuccessful. Will rescheduled another outreach call for next week if unsuccessful will mail outreach letter accordingly.  Raina Mina, RN Care Management Coordinator [place] Office [phone]

## 2017-02-14 IMAGING — CR DG CHEST 1V PORT
1 series · 1 of 1 positions shown · non-contrast
Comparison: 03/31/2015

CLINICAL DATA: Chest tightness, shortness of breath for 1 day

EXAM:
PORTABLE CHEST 1 VIEW

[AP]
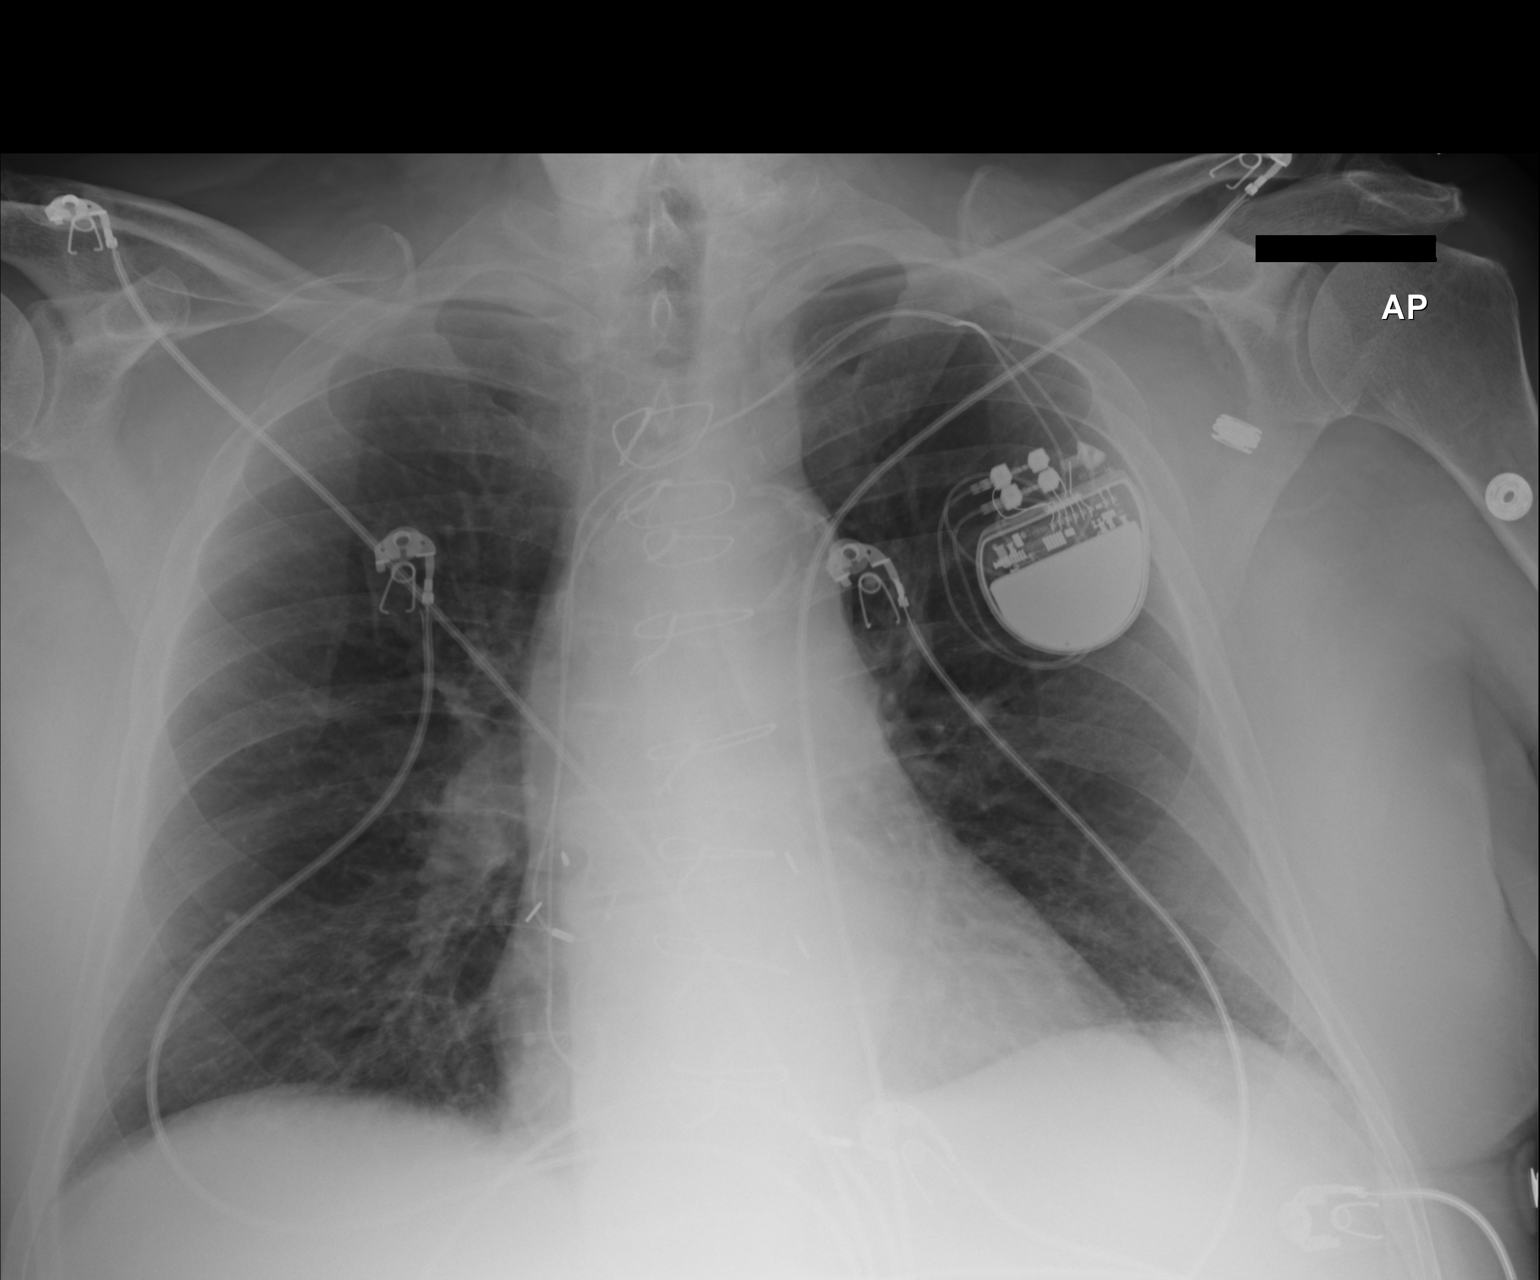

[1 of 1 positions shown; findings below may reference images not displayed]

FINDINGS: Prior CABG. Left pacer in stable position. Heart and mediastinal
contours are within normal limits. No focal opacities or effusions.
No acute bony abnormality.
IMPRESSION: No active cardiopulmonary disease.

## 2017-02-15 ENCOUNTER — Encounter: Payer: Self-pay | Admitting: *Deleted

## 2017-02-15 ENCOUNTER — Other Ambulatory Visit: Payer: Self-pay | Admitting: *Deleted

## 2017-02-15 NOTE — Patient Outreach (Signed)
[facility]) Care Management  02/15/2017  CARVEL HUSKINS 1927/04/12 [id number]   RN spoke with pt today and introduced the [facility] program and services. Verified identifiers and inquired further on possible needs. Pt states she has recent seem Dr. Carlyle Lipa pharmacy and review all his medications and decline to review today with this RN case Freight forwarder. Pt states he is doing well and managing his care with no reported issues. RN explained once again the [facility] services to assist with managing his care and providing community resources as needed. Pt verbalized an understanding and opt to decline this information at this time. RN also discuss other medication conditions such as his HF and offered possible home visit to further provide educational information to better assist pt in managing this condition however pt decline indicating he does not need any assistance at this time and has a daughter who is a nurse that assist him when needed. Pt has opt to decline both community and telephone disease management services at this time. Case will be closed and primary will be notified of pt's choice to decline all [facility] services at this time.   Raina Mina, RN Care Management Coordinator [place] Office [phone]

## 2017-02-16 ENCOUNTER — Telehealth: Payer: Self-pay | Admitting: *Deleted

## 2017-02-16 ENCOUNTER — Other Ambulatory Visit (HOSPITAL_BASED_OUTPATIENT_CLINIC_OR_DEPARTMENT_OTHER): Payer: Medicare Other

## 2017-02-16 DIAGNOSIS — Z7901 Long term (current) use of anticoagulants: Secondary | ICD-10-CM | POA: Diagnosis not present

## 2017-02-16 DIAGNOSIS — I2699 Other pulmonary embolism without acute cor pulmonale: Secondary | ICD-10-CM

## 2017-02-16 DIAGNOSIS — C8583 Other specified types of non-Hodgkin lymphoma, intra-abdominal lymph nodes: Secondary | ICD-10-CM | POA: Diagnosis present

## 2017-02-16 LAB — PROTIME-INR
INR: 1.4 — AB (ref 2.00–3.50)
Protime: 16.8 Seconds — ABNORMAL HIGH (ref 10.6–13.4)

## 2017-02-16 NOTE — Telephone Encounter (Signed)
Spoke with pt's wife, she denies any missed doses. No dietary or medication changes. INR reviewed by Dr. Benay Spice. Continue same dose, repeat lab in 2 weeks.

## 2017-02-22 DIAGNOSIS — L03011 Cellulitis of right finger: Secondary | ICD-10-CM | POA: Diagnosis not present

## 2017-02-22 DIAGNOSIS — L98499 Non-pressure chronic ulcer of skin of other sites with unspecified severity: Secondary | ICD-10-CM | POA: Diagnosis not present

## 2017-02-22 DIAGNOSIS — L03031 Cellulitis of right toe: Secondary | ICD-10-CM | POA: Diagnosis not present

## 2017-02-22 DIAGNOSIS — L570 Actinic keratosis: Secondary | ICD-10-CM | POA: Diagnosis not present

## 2017-02-22 DIAGNOSIS — D692 Other nonthrombocytopenic purpura: Secondary | ICD-10-CM | POA: Diagnosis not present

## 2017-02-22 DIAGNOSIS — L821 Other seborrheic keratosis: Secondary | ICD-10-CM | POA: Diagnosis not present

## 2017-02-22 DIAGNOSIS — Z85828 Personal history of other malignant neoplasm of skin: Secondary | ICD-10-CM | POA: Diagnosis not present

## 2017-02-25 ENCOUNTER — Ambulatory Visit (INDEPENDENT_AMBULATORY_CARE_PROVIDER_SITE_OTHER): Payer: Medicare Other | Admitting: *Deleted

## 2017-02-25 DIAGNOSIS — H1589 Other disorders of sclera: Secondary | ICD-10-CM | POA: Insufficient documentation

## 2017-02-25 DIAGNOSIS — I495 Sick sinus syndrome: Secondary | ICD-10-CM | POA: Diagnosis not present

## 2017-02-25 DIAGNOSIS — Z45018 Encounter for adjustment and management of other part of cardiac pacemaker: Secondary | ICD-10-CM

## 2017-02-25 DIAGNOSIS — H353134 Nonexudative age-related macular degeneration, bilateral, advanced atrophic with subfoveal involvement: Secondary | ICD-10-CM | POA: Diagnosis not present

## 2017-02-25 DIAGNOSIS — H4323 Crystalline deposits in vitreous body, bilateral: Secondary | ICD-10-CM | POA: Diagnosis not present

## 2017-02-25 DIAGNOSIS — Z961 Presence of intraocular lens: Secondary | ICD-10-CM | POA: Diagnosis not present

## 2017-02-25 DIAGNOSIS — H35371 Puckering of macula, right eye: Secondary | ICD-10-CM | POA: Diagnosis not present

## 2017-02-25 DIAGNOSIS — H04123 Dry eye syndrome of bilateral lacrimal glands: Secondary | ICD-10-CM | POA: Insufficient documentation

## 2017-02-25 LAB — CUP PACEART INCLINIC DEVICE CHECK
Date Time Interrogation Session: 20180802040000
Implantable Lead Implant Date: 20081013
Implantable Lead Implant Date: 20081013
Implantable Lead Location: 753859
Implantable Lead Model: 4457
Implantable Lead Serial Number: 210110
Lead Channel Impedance Value: 500 Ohm
Lead Channel Pacing Threshold Amplitude: 0.5 V
Lead Channel Pacing Threshold Amplitude: 1.5 V
Lead Channel Pacing Threshold Pulse Width: 0.4 ms
Lead Channel Sensing Intrinsic Amplitude: 10.4 mV
Lead Channel Setting Pacing Amplitude: 3 V
Lead Channel Setting Sensing Sensitivity: 2.5 mV
MDC IDC LEAD LOCATION: 753860
MDC IDC LEAD SERIAL: 205344
MDC IDC MSMT LEADCHNL RA IMPEDANCE VALUE: 560 Ohm
MDC IDC MSMT LEADCHNL RA SENSING INTR AMPL: 1.2 mV
MDC IDC MSMT LEADCHNL RV PACING THRESHOLD PULSEWIDTH: 1 ms
MDC IDC PG IMPLANT DT: 20081013
MDC IDC SET LEADCHNL RA PACING AMPLITUDE: 2 V
MDC IDC SET LEADCHNL RV PACING PULSEWIDTH: 1 ms
MDC IDC STAT BRADY RA PERCENT PACED: 97 %
MDC IDC STAT BRADY RV PERCENT PACED: 5 %
Pulse Gen Model: 1297
Pulse Gen Serial Number: 317986

## 2017-02-25 NOTE — Progress Notes (Signed)
Pacemaker check in clinic. Normal device function. RA thresholds, sensing, impedances consistent with previous measurements. RV threshold now 1.5V @ 1.7ms, reprogrammed output to 3.0V @ 1.33ms. Device programmed to maximize longevity. No mode switch or high ventricular rates noted. Device programmed at appropriate safety margins. Histogram distribution appropriate for patient activity level. Device programmed to optimize intrinsic conduction. Estimated longevity <0.5 years. Patient education completed. ROV with Device Clinic on 04/29/17 for battery check (N/C) and ROV with [facility] in 10/2017.

## 2017-03-09 DIAGNOSIS — N183 Chronic kidney disease, stage 3 (moderate): Secondary | ICD-10-CM | POA: Diagnosis not present

## 2017-03-09 DIAGNOSIS — C61 Malignant neoplasm of prostate: Secondary | ICD-10-CM | POA: Diagnosis not present

## 2017-03-09 DIAGNOSIS — I509 Heart failure, unspecified: Secondary | ICD-10-CM | POA: Diagnosis not present

## 2017-03-09 DIAGNOSIS — F321 Major depressive disorder, single episode, moderate: Secondary | ICD-10-CM | POA: Diagnosis not present

## 2017-03-09 DIAGNOSIS — I2510 Atherosclerotic heart disease of native coronary artery without angina pectoris: Secondary | ICD-10-CM | POA: Diagnosis not present

## 2017-03-09 DIAGNOSIS — I129 Hypertensive chronic kidney disease with stage 1 through stage 4 chronic kidney disease, or unspecified chronic kidney disease: Secondary | ICD-10-CM | POA: Diagnosis not present

## 2017-03-20 ENCOUNTER — Other Ambulatory Visit: Payer: Self-pay | Admitting: Interventional Cardiology

## 2017-03-23 DIAGNOSIS — N1339 Other hydronephrosis: Secondary | ICD-10-CM | POA: Diagnosis not present

## 2017-03-23 DIAGNOSIS — R350 Frequency of micturition: Secondary | ICD-10-CM | POA: Diagnosis not present

## 2017-03-30 ENCOUNTER — Other Ambulatory Visit: Payer: Self-pay | Admitting: *Deleted

## 2017-03-30 MED ORDER — WARFARIN SODIUM 5 MG PO TABS: 5.0000 mg | ORAL_TABLET | Freq: Every day | ORAL | 1 refills | Status: DC

## 2017-04-04 ENCOUNTER — Other Ambulatory Visit: Payer: Self-pay | Admitting: Oncology

## 2017-04-23 ENCOUNTER — Other Ambulatory Visit: Payer: Self-pay | Admitting: Oncology

## 2017-04-26 DIAGNOSIS — I5032 Chronic diastolic (congestive) heart failure: Secondary | ICD-10-CM | POA: Diagnosis not present

## 2017-04-26 DIAGNOSIS — G4733 Obstructive sleep apnea (adult) (pediatric): Secondary | ICD-10-CM | POA: Diagnosis not present

## 2017-04-26 DIAGNOSIS — I739 Peripheral vascular disease, unspecified: Secondary | ICD-10-CM | POA: Diagnosis not present

## 2017-04-26 DIAGNOSIS — E7800 Pure hypercholesterolemia, unspecified: Secondary | ICD-10-CM | POA: Diagnosis not present

## 2017-04-26 DIAGNOSIS — N139 Obstructive and reflux uropathy, unspecified: Secondary | ICD-10-CM | POA: Diagnosis not present

## 2017-04-26 DIAGNOSIS — I495 Sick sinus syndrome: Secondary | ICD-10-CM | POA: Diagnosis not present

## 2017-04-26 DIAGNOSIS — I129 Hypertensive chronic kidney disease with stage 1 through stage 4 chronic kidney disease, or unspecified chronic kidney disease: Secondary | ICD-10-CM | POA: Diagnosis not present

## 2017-04-26 DIAGNOSIS — D631 Anemia in chronic kidney disease: Secondary | ICD-10-CM | POA: Diagnosis not present

## 2017-04-26 DIAGNOSIS — N2581 Secondary hyperparathyroidism of renal origin: Secondary | ICD-10-CM | POA: Diagnosis not present

## 2017-04-26 DIAGNOSIS — Z950 Presence of cardiac pacemaker: Secondary | ICD-10-CM | POA: Diagnosis not present

## 2017-04-26 DIAGNOSIS — I2510 Atherosclerotic heart disease of native coronary artery without angina pectoris: Secondary | ICD-10-CM | POA: Diagnosis not present

## 2017-04-26 DIAGNOSIS — N183 Chronic kidney disease, stage 3 (moderate): Secondary | ICD-10-CM | POA: Diagnosis not present

## 2017-04-27 ENCOUNTER — Telehealth: Payer: Self-pay | Admitting: Oncology

## 2017-04-27 ENCOUNTER — Ambulatory Visit (HOSPITAL_BASED_OUTPATIENT_CLINIC_OR_DEPARTMENT_OTHER): Payer: Medicare Other

## 2017-04-27 ENCOUNTER — Ambulatory Visit (HOSPITAL_BASED_OUTPATIENT_CLINIC_OR_DEPARTMENT_OTHER): Payer: Medicare Other | Admitting: Oncology

## 2017-04-27 ENCOUNTER — Other Ambulatory Visit (HOSPITAL_BASED_OUTPATIENT_CLINIC_OR_DEPARTMENT_OTHER): Payer: Medicare Other

## 2017-04-27 ENCOUNTER — Ambulatory Visit: Payer: Medicare Other

## 2017-04-27 VITALS — BP 140/60 | HR 65 | Temp 98.0°F | Resp 20

## 2017-04-27 VITALS — BP 135/63 | HR 65 | Temp 98.0°F | Resp 16 | Ht 70.0 in | Wt 195.3 lb

## 2017-04-27 DIAGNOSIS — C8193 Hodgkin lymphoma, unspecified, intra-abdominal lymph nodes: Secondary | ICD-10-CM

## 2017-04-27 DIAGNOSIS — Z8546 Personal history of malignant neoplasm of prostate: Secondary | ICD-10-CM | POA: Diagnosis not present

## 2017-04-27 DIAGNOSIS — N189 Chronic kidney disease, unspecified: Secondary | ICD-10-CM | POA: Diagnosis not present

## 2017-04-27 DIAGNOSIS — C8299 Follicular lymphoma, unspecified, extranodal and solid organ sites: Secondary | ICD-10-CM

## 2017-04-27 DIAGNOSIS — C8593 Non-Hodgkin lymphoma, unspecified, intra-abdominal lymph nodes: Secondary | ICD-10-CM

## 2017-04-27 DIAGNOSIS — Z86718 Personal history of other venous thrombosis and embolism: Secondary | ICD-10-CM

## 2017-04-27 DIAGNOSIS — I480 Paroxysmal atrial fibrillation: Secondary | ICD-10-CM

## 2017-04-27 DIAGNOSIS — Z7901 Long term (current) use of anticoagulants: Secondary | ICD-10-CM | POA: Diagnosis not present

## 2017-04-27 DIAGNOSIS — C61 Malignant neoplasm of prostate: Secondary | ICD-10-CM

## 2017-04-27 DIAGNOSIS — Z5112 Encounter for antineoplastic immunotherapy: Secondary | ICD-10-CM

## 2017-04-27 DIAGNOSIS — D649 Anemia, unspecified: Secondary | ICD-10-CM

## 2017-04-27 LAB — CBC WITH DIFFERENTIAL/PLATELET
BASO%: 0.4 % (ref 0.0–2.0)
BASOS ABS: 0 10*3/uL (ref 0.0–0.1)
EOS%: 4.3 % (ref 0.0–7.0)
Eosinophils Absolute: 0.2 10*3/uL (ref 0.0–0.5)
HCT: 33.2 % — ABNORMAL LOW (ref 38.4–49.9)
HGB: 10.9 g/dL — ABNORMAL LOW (ref 13.0–17.1)
LYMPH%: 21.8 % (ref 14.0–49.0)
MCH: 29.9 pg (ref 27.2–33.4)
MCHC: 32.8 g/dL (ref 32.0–36.0)
MCV: 91.2 fL (ref 79.3–98.0)
MONO#: 0.8 10*3/uL (ref 0.1–0.9)
MONO%: 13.3 % (ref 0.0–14.0)
NEUT#: 3.4 10*3/uL (ref 1.5–6.5)
NEUT%: 60.2 % (ref 39.0–75.0)
Platelets: 144 10*3/uL (ref 140–400)
RBC: 3.64 10*6/uL — ABNORMAL LOW (ref 4.20–5.82)
RDW: 14.6 % (ref 11.0–14.6)
WBC: 5.6 10*3/uL (ref 4.0–10.3)
lymph#: 1.2 10*3/uL (ref 0.9–3.3)

## 2017-04-27 LAB — COMPREHENSIVE METABOLIC PANEL
ALT: 9 U/L (ref 0–55)
AST: 16 U/L (ref 5–34)
Albumin: 3.3 g/dL — ABNORMAL LOW (ref 3.5–5.0)
Alkaline Phosphatase: 56 U/L (ref 40–150)
Anion Gap: 10 mEq/L (ref 3–11)
BUN: 36.3 mg/dL — AB (ref 7.0–26.0)
CHLORIDE: 105 meq/L (ref 98–109)
CO2: 25 mEq/L (ref 22–29)
Calcium: 9.8 mg/dL (ref 8.4–10.4)
Creatinine: 1.6 mg/dL — ABNORMAL HIGH (ref 0.7–1.3)
EGFR: 38 mL/min/{1.73_m2} — AB (ref >90)
GLUCOSE: 123 mg/dL (ref 70–140)
POTASSIUM: 3.8 meq/L (ref 3.5–5.1)
SODIUM: 139 meq/L (ref 136–145)
Total Bilirubin: 0.42 mg/dL (ref 0.20–1.20)
Total Protein: 5.9 g/dL — ABNORMAL LOW (ref 6.4–8.3)

## 2017-04-27 LAB — PROTIME-INR
INR: 2.1 (ref 2.00–3.50)
PROTIME: 25.2 s — AB (ref 10.6–13.4)

## 2017-04-27 MED ORDER — DIPHENHYDRAMINE HCL 25 MG PO CAPS
50.0000 mg | ORAL_CAPSULE | Freq: Once | ORAL | Status: AC
  Administered 2017-04-27: 50 mg via ORAL

## 2017-04-27 MED ORDER — ACETAMINOPHEN 325 MG PO TABS
ORAL_TABLET | ORAL | Status: AC
  Filled 2017-04-27: qty 2

## 2017-04-27 MED ORDER — ACETAMINOPHEN 325 MG PO TABS
650.0000 mg | ORAL_TABLET | Freq: Once | ORAL | Status: AC
  Administered 2017-04-27: 650 mg via ORAL

## 2017-04-27 MED ORDER — DIPHENHYDRAMINE HCL 25 MG PO CAPS
ORAL_CAPSULE | ORAL | Status: AC
  Filled 2017-04-27: qty 2

## 2017-04-27 MED ORDER — SODIUM CHLORIDE 0.9 % IV SOLN
Freq: Once | INTRAVENOUS | Status: AC | PRN
  Administered 2017-04-27: 15:00:00 via INTRAVENOUS

## 2017-04-27 MED ORDER — ONDANSETRON HCL 8 MG PO TABS
ORAL_TABLET | ORAL | Status: AC
  Filled 2017-04-27: qty 1

## 2017-04-27 MED ORDER — SODIUM CHLORIDE 0.9 % IV SOLN
Freq: Once | INTRAVENOUS | Status: AC
  Administered 2017-04-27: 13:00:00 via INTRAVENOUS

## 2017-04-27 MED ORDER — RITUXIMAB CHEMO INJECTION 500 MG/50ML
375.0000 mg/m2 | Freq: Once | INTRAVENOUS | Status: AC
  Administered 2017-04-27: 800 mg via INTRAVENOUS
  Filled 2017-04-27: qty 50

## 2017-04-27 MED ORDER — ONDANSETRON HCL 8 MG PO TABS
8.0000 mg | ORAL_TABLET | Freq: Once | ORAL | Status: AC
  Administered 2017-04-27: 8 mg via ORAL

## 2017-04-27 NOTE — Progress Notes (Signed)
1430 pt c/o nausea and stated, "I might be sick." Pt daughter told me that pt has had these symptoms in the past. Paused rituxan. Took VS at 1435. BP 153/133 automatic. Rechecked BP 1440 manually 140/60. Communicated to Dr. Benay Spice that pt nauseous upon complaint. Original direction to give 5mg  compazine PO. Not available. Zofran 8mg  PO ordered and administered at 1450. IVF running wide open 1440-1455. Dr. Benay Spice at chairside 1505 to assess pt. Other than nausea, only other symptom noted was face flushing. Pt drowsy, but give 50mg  PO benadryl as pre-medication. Dr. Benay Spice okay to restart Rituxan at 229mL/hr.

## 2017-04-27 NOTE — Patient Instructions (Signed)
[facility] Discharge Instructions for Patients Receiving Chemotherapy  Today you received the following chemotherapy agents: rituximab (Rituxan).   To help prevent nausea and vomiting after your treatment, we encourage you to take your nausea medication as directed.    If you develop nausea and vomiting that is not controlled by your nausea medication, call the clinic.   BELOW ARE SYMPTOMS THAT SHOULD BE REPORTED IMMEDIATELY:  *FEVER GREATER THAN 100.5 F  *CHILLS WITH OR WITHOUT FEVER  NAUSEA AND VOMITING THAT IS NOT CONTROLLED WITH YOUR NAUSEA MEDICATION  *UNUSUAL SHORTNESS OF BREATH  *UNUSUAL BRUISING OR BLEEDING  TENDERNESS IN MOUTH AND THROAT WITH OR WITHOUT PRESENCE OF ULCERS  *URINARY PROBLEMS  *BOWEL PROBLEMS  UNUSUAL RASH Items with * indicate a potential emergency and should be followed up as soon as possible.  Feel free to call the clinic you have any questions or concerns. The clinic phone number is [phone].  Please show the [facility] at check-in to the Emergency Department and triage nurse.

## 2017-04-27 NOTE — Telephone Encounter (Signed)
Left message re November and January appointments. Schedule mailed.

## 2017-04-27 NOTE — Progress Notes (Signed)
[place] OFFICE PROGRESS NOTE   Diagnosis: Non-Hodgkin's lymphoma  INTERVAL HISTORY:   Edward Woodward returns as scheduled. He was last treated with rituximab on 02/02/2017. He reports tolerating the rituximab well. He tripped with his walker on 04/25/2017 and fell. He has a bruise at the right forehead. His wife reports he has been weaker since the fall. He had one episode of blood on the underpants. He reports "black "stool.  Objective:  Vital signs in last 24 hours:  Blood pressure 135/63, pulse 65, temperature 98 F (36.7 C), temperature source Oral, resp. rate 16, height 5\' 10"  (1.778 m), weight 195 lb 4.8 oz (88.6 kg), SpO2 99 %.    HEENT: No thrush or ulcers Lymphatics: No cervical, supraclavicular, or inguinal nodes. Prominent bilateral axillary fat pads. Resp: Inspiratory rhonchi at the left base, no respiratory distress Cardio: Regular rate and rhythm GI: No hepatosplenomegaly, nontender Vascular: No leg edema Neuro: Alert, follows commands, ambulates with a slow gait  Skin: Resolving ecchymosis at the right forehead   Lab Results:  Lab Results  Component Value Date   WBC 5.6 04/27/2017   HGB 10.9 (L) 04/27/2017   HCT 33.2 (L) 04/27/2017   MCV 91.2 04/27/2017   PLT 144 04/27/2017   NEUTROABS 3.4 04/27/2017    CMP     Component Value Date/Time   NA 139 04/27/2017 1033   K 3.8 04/27/2017 1033   CL 107 10/07/2016 1050   CL 105 05/31/2012 1058   CO2 25 04/27/2017 1033   GLUCOSE 123 04/27/2017 1033   GLUCOSE 85 05/31/2012 1058   BUN 36.3 (H) 04/27/2017 1033   CREATININE 1.6 (H) 04/27/2017 1033   CALCIUM 9.8 04/27/2017 1033   PROT 5.9 (L) 04/27/2017 1033   ALBUMIN 3.3 (L) 04/27/2017 1033   AST 16 04/27/2017 1033   ALT 9 04/27/2017 1033   ALKPHOS 56 04/27/2017 1033   BILITOT 0.42 04/27/2017 1033   GFRNONAA 41 (L) 10/07/2016 1050   GFRAA 48 (L) 10/07/2016 1050     Lab Results  Component Value Date   INR 2.10 04/27/2017      Medications: I have reviewed the patient's current medications.  Assessment/Plan: 1. Non-Hodgkin's lymphoma (follicular grade 3 lymphoma) - status post 6 cycles of Cytoxan/prednisone/rituximab 07/01/2007 through 10/21/2007.   Biopsy of a retroperitoneal mass 05/13/2016 consistent with involvement by low-grade non-Hodgkin's lymphoma  Initiation of weekly Rituxan 06/17/2016; week 4given 07/09/2016.  CT abdomen/pelvis 09/15/2016-improvement in retroperitoneal and pelvic soft tissue  Initiation of maintenance Rituxan every 3 months 10/13/2016 (2 year course planned) 2. CT chest 04/01/2015 with no lymphadenopathy  CT abdomen/pelvis 05/01/2015 with mild progression of abdomen/pelvic lymphadenopathy 3. Prostate cancer - he has been treated with hormonal therapy, the PSA was stable on 10/15/2014  PSA increased 04/26/2015   Bone scan 05/09/2015 with unchanged increased activity in the thoracic and lumbar spine felt to most likely be degenerative.   Initiation of Abiraterone and prednisone 05/11/2015. Normalization of the PSA on Abiraterone. 4. History of a normocytic anemia secondary to non-Hodgkin's lymphoma, chemotherapy, and renal insufficiency - progressive secondary to GI bleeding, stable 5. History of Mild thrombocytopenia  6. History of bilateral hydronephrosis. Renal ultrasound 09/26/2014 consistent with medical renal disease. No hydronephrosis. Ureter stents removed 10/09/2016. 7. Coronary artery disease - status post coronary artery stent placement. 8. History of noncalcified lung nodules. 9. History of severe neutropenia July 2009 - likely related to delayed toxicity from rituximab. 10. Macular degeneration. 11. Right middle lobe density on a  CT of the chest 06/18/2010 measuring 2.5 x 1.6 cm with mildly increased metabolic activity (SUV max 2.5) on a PET scan 06/27/2010. The area of increased density was less confluent and appeared smaller on a restaging CT 09/08/2010.  Right middle lobe "scarring" with no new or suspicious pulmonary nodule on a CT 08/04/2011. 12. Mild increased metabolic activity associated with several lymph nodes on the PET scan 06/27/2010 - likely related to lymphoma. No lymphadenopathy in the mediastinum or axillary areas on the CT 08/04/2011. 13. Right hip discomfort-he received a steroid injection by his primary physician without improvement. He saw Dr. Collier Salina and there was a question of a lytic process in the right pelvis. A bone scan on 09/21/2012 revealed no evidence of metastatic disease. 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 Renal insufficiency-progressive CT 01/12/2016 confirmed progressive hydronephrosis  Placement of bilateral ureter stents 04/10/2016  Ureter stents removed 10/09/2016 15. Left leg DVT and pulmonary embolism 04/01/2015-on Coumadin 16. Admission 08/23/2015 with severe anemia and GI bleeding  Upper and lower endoscopy 08/26/2015 without a clear source for bleeding identified 17. Leg edema/anasarca-improved with leg wrapping and diuretics 18. Anemia secondary to chronic disease, renal failure, and possibly lymphoma 19. 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   Biopsy retroperitoneal soft tissue 05/13/2016 20. History of elevated calcium-most likely secondary to non-Hodgkin's lymphoma status post pamidronate 06/08/2016, calcitonin 06/11/2016   Disposition:  Edward Woodward remains in clinical remission from [place]. The plan is to continue maintenance rituximab. He has stable mild anemia and stable chronic renal failure. The PT/INR is in the therapeutic range.  He reports recent "black "stool and an episode of blood on his underpants. The hemoglobin is stable. He has a history of GI bleeding. Ms. Vi will contact us if he has recurrent evidence of bleeding.  Edward Woodward will  return for a lab visit in 6 weeks and an office visit in 3 months. He will obtain an influenza vaccine.  25 minutes were spent with the patient today. The majority of the time was used for counseling and coordination of care.  Donneta Romberg, MD  04/27/2017  11:51 AM

## 2017-04-27 NOTE — Progress Notes (Addendum)
Nutrition Follow-up:  Patient with history of prostate cancer and lymphoma currently in remission followed by Dr. Benay Spice  Met with patient and wife during infusion today.  Patient reports appetite is fairly good.  Reports that he is drinking 1-2 premier protein shakes per day.  Eats cereal for breakfast, lunch usually 1/2 sandwich and supper meat and vegetables that wife prepares.  Also likes fruit and ice cream per wife.   Wife reports issues with dry mouth, mouth pain (no sores or thrush).    Medications: reviewed  Labs: reviewed  Anthropometrics:   Weight increased today to 195 lb 4.8 oz from 192 lb 12.8 oz on 7.10   NUTRITION DIAGNOSIS: Inadequate oral intake improving   MALNUTRITION DIAGNOSIS: Severe malnutrition improving   INTERVENTION:   Discussed strategies to help with dry and sore mouth.  Fact sheet provided.  Encouraged continuing premier protein shakes for added nutrition.     MONITORING, EVALUATION, GOAL: Patient will tolerate increased calories and protein to prevent further weight loss   NEXT VISIT: as needed  Kaniesha Barile B. Zenia Resides, [place], [place] Registered Dietitian [phone] (pager)

## 2017-04-28 LAB — PSA: Prostate Specific Ag, Serum: 1.2 ng/mL (ref 0.0–4.0)

## 2017-04-29 ENCOUNTER — Ambulatory Visit (INDEPENDENT_AMBULATORY_CARE_PROVIDER_SITE_OTHER): Payer: Medicare Other | Admitting: *Deleted

## 2017-04-29 DIAGNOSIS — Z23 Encounter for immunization: Secondary | ICD-10-CM | POA: Diagnosis not present

## 2017-04-29 DIAGNOSIS — I480 Paroxysmal atrial fibrillation: Secondary | ICD-10-CM | POA: Diagnosis not present

## 2017-04-29 LAB — CUP PACEART INCLINIC DEVICE CHECK
Implantable Lead Implant Date: 20081013
Implantable Lead Location: 753859
Implantable Lead Model: 4457
Implantable Lead Model: 4470
Implantable Lead Serial Number: 205344
MDC IDC LEAD IMPLANT DT: 20081013
MDC IDC LEAD LOCATION: 753860
MDC IDC LEAD SERIAL: 210110
MDC IDC PG IMPLANT DT: 20081013
MDC IDC SESS DTM: 20181004123945
Pulse Gen Serial Number: 317986

## 2017-04-29 NOTE — Progress Notes (Signed)
Battery check only: Estimated battery life at <0.11mo. ROV in [place] 11/26

## 2017-05-04 DIAGNOSIS — H353232 Exudative age-related macular degeneration, bilateral, with inactive choroidal neovascularization: Secondary | ICD-10-CM | POA: Diagnosis not present

## 2017-05-04 DIAGNOSIS — H43813 Vitreous degeneration, bilateral: Secondary | ICD-10-CM | POA: Diagnosis not present

## 2017-05-04 DIAGNOSIS — Z961 Presence of intraocular lens: Secondary | ICD-10-CM | POA: Diagnosis not present

## 2017-05-04 DIAGNOSIS — H35353 Cystoid macular degeneration, bilateral: Secondary | ICD-10-CM | POA: Diagnosis not present

## 2017-05-21 ENCOUNTER — Telehealth: Payer: Self-pay

## 2017-05-21 ENCOUNTER — Ambulatory Visit (HOSPITAL_BASED_OUTPATIENT_CLINIC_OR_DEPARTMENT_OTHER): Payer: Medicare Other | Admitting: Nurse Practitioner

## 2017-05-21 ENCOUNTER — Ambulatory Visit (HOSPITAL_BASED_OUTPATIENT_CLINIC_OR_DEPARTMENT_OTHER): Payer: Medicare Other

## 2017-05-21 VITALS — BP 128/53 | HR 65 | Temp 98.1°F | Resp 20 | Wt 189.0 lb

## 2017-05-21 DIAGNOSIS — Z8546 Personal history of malignant neoplasm of prostate: Secondary | ICD-10-CM | POA: Diagnosis not present

## 2017-05-21 DIAGNOSIS — C8299 Follicular lymphoma, unspecified, extranodal and solid organ sites: Secondary | ICD-10-CM

## 2017-05-21 DIAGNOSIS — Z86718 Personal history of other venous thrombosis and embolism: Secondary | ICD-10-CM

## 2017-05-21 DIAGNOSIS — C8589 Other specified types of non-Hodgkin lymphoma, extranodal and solid organ sites: Secondary | ICD-10-CM | POA: Diagnosis not present

## 2017-05-21 DIAGNOSIS — R350 Frequency of micturition: Secondary | ICD-10-CM

## 2017-05-21 DIAGNOSIS — R410 Disorientation, unspecified: Secondary | ICD-10-CM

## 2017-05-21 DIAGNOSIS — Z8572 Personal history of non-Hodgkin lymphomas: Secondary | ICD-10-CM

## 2017-05-21 LAB — CBC WITH DIFFERENTIAL/PLATELET
BASO%: 0.2 % (ref 0.0–2.0)
BASOS ABS: 0 10*3/uL (ref 0.0–0.1)
EOS ABS: 0.2 10*3/uL (ref 0.0–0.5)
EOS%: 5.4 % (ref 0.0–7.0)
HCT: 33.5 % — ABNORMAL LOW (ref 38.4–49.9)
HGB: 10.8 g/dL — ABNORMAL LOW (ref 13.0–17.1)
LYMPH%: 29.3 % (ref 14.0–49.0)
MCH: 29.8 pg (ref 27.2–33.4)
MCHC: 32.2 g/dL (ref 32.0–36.0)
MCV: 92.3 fL (ref 79.3–98.0)
MONO#: 0.7 10*3/uL (ref 0.1–0.9)
MONO%: 15.6 % — ABNORMAL HIGH (ref 0.0–14.0)
NEUT%: 49.5 % (ref 39.0–75.0)
NEUTROS ABS: 2.2 10*3/uL (ref 1.5–6.5)
PLATELETS: 138 10*3/uL — AB (ref 140–400)
RBC: 3.63 10*6/uL — AB (ref 4.20–5.82)
RDW: 15 % — ABNORMAL HIGH (ref 11.0–14.6)
WBC: 4.4 10*3/uL (ref 4.0–10.3)
lymph#: 1.3 10*3/uL (ref 0.9–3.3)

## 2017-05-21 LAB — URINALYSIS, MICROSCOPIC - CHCC
BILIRUBIN (URINE): NEGATIVE
Glucose: NEGATIVE mg/dL
KETONES: NEGATIVE mg/dL
Leukocyte Esterase: NEGATIVE
Nitrite: NEGATIVE
Protein: <30 mg/dL
SPECIFIC GRAVITY, URINE: 1.02 (ref 1.003–1.035)
Urobilinogen, UR: 0.2 mg/dL (ref 0.2–1)
pH: 5 (ref 4.6–8.0)

## 2017-05-21 LAB — COMPREHENSIVE METABOLIC PANEL
ALK PHOS: 59 U/L (ref 40–150)
ALT: 12 U/L (ref 0–55)
ANION GAP: 9 meq/L (ref 3–11)
AST: 20 U/L (ref 5–34)
Albumin: 3.4 g/dL — ABNORMAL LOW (ref 3.5–5.0)
BILIRUBIN TOTAL: 0.49 mg/dL (ref 0.20–1.20)
BUN: 31.7 mg/dL — ABNORMAL HIGH (ref 7.0–26.0)
CO2: 24 meq/L (ref 22–29)
Calcium: 10.3 mg/dL (ref 8.4–10.4)
Chloride: 105 mEq/L (ref 98–109)
Creatinine: 1.5 mg/dL — ABNORMAL HIGH (ref 0.7–1.3)
EGFR: 42 mL/min/{1.73_m2} — AB (ref >60)
Glucose: 127 mg/dl (ref 70–140)
Potassium: 4.4 mEq/L (ref 3.5–5.1)
Sodium: 138 mEq/L (ref 136–145)
TOTAL PROTEIN: 6 g/dL — AB (ref 6.4–8.3)

## 2017-05-21 LAB — PROTIME-INR
INR: 2.2 (ref 2.00–3.50)
Protime: 26.4 Seconds — ABNORMAL HIGH (ref 10.6–13.4)

## 2017-05-21 NOTE — Progress Notes (Addendum)
[place] OFFICE PROGRESS NOTE   Diagnosis:  Non-Hodgkin's lymphoma  INTERVAL HISTORY:   Edward Woodward returns prior to scheduled follow-up for evaluation of weakness. He was last treated with Rituxan 04/27/2017. He has nasal congestion and a cough, both improving.No shortness of breath. No fever. He feels weak some days. His wife notes intermittent confusion. He has had no further falls. He notes difficulty rising from a low sitting position to standing. He denies dysuria. He reports urinary frequency for the past 2 weeks. He also notes urine is darker. He denies dysuria. His wife noted a "pink area" in his underwear today.  Objective:  Vital signs in last 24 hours:  Blood pressure (!) 128/53, pulse 65, temperature 98.1 F (36.7 C), temperature source Oral, resp. rate 20, weight 189 lb (85.7 kg), SpO2 96 %.    HEENT: mouth appears dry. No thrush. Lymphatics: no palpable cervical or supraclavicular lymph nodes. Prominent bilateral axillary fat pads. Resp: lungs clear bilaterally. Cardio: regular rate and rhythm. GI: no hepatomegaly. Vascular: no leg edema. Neuro: alert. Follows commands.    Lab Results:  Lab Results  Component Value Date   WBC 4.4 05/21/2017   HGB 10.8 (L) 05/21/2017   HCT 33.5 (L) 05/21/2017   MCV 92.3 05/21/2017   PLT 138 (L) 05/21/2017   NEUTROABS 2.2 05/21/2017    Imaging:  No results found.  Medications: I have reviewed the patient's current medications.  Assessment/Plan: 1. Non-Hodgkin's lymphoma (follicular grade 3 lymphoma) - status post 6 cycles of Cytoxan/prednisone/rituximab 07/01/2007 through 10/21/2007.   Biopsy of a retroperitoneal mass 05/13/2016 consistent with involvement by low-grade non-Hodgkin's lymphoma  Initiation of weekly Rituxan 06/17/2016; week 4given 07/09/2016.  CT abdomen/pelvis 09/15/2016-improvement in retroperitoneal and pelvic soft tissue  Initiation of maintenance Rituxan every 3 months  10/13/2016 (2 year course planned) 2. CT chest 04/01/2015 with no lymphadenopathy  CT abdomen/pelvis 05/01/2015 with mild progression of abdomen/pelvic lymphadenopathy 3. Prostate cancer - he has been treated with hormonal therapy, the PSA was stable on 10/15/2014  PSA increased 04/26/2015   Bone scan 05/09/2015 with unchanged increased activity in the thoracic and lumbar spine felt to most likely be degenerative.   Initiation of Abiraterone and prednisone 05/11/2015. Normalization of the PSA on Abiraterone. 4. History of a normocytic anemia secondary to non-Hodgkin's lymphoma, chemotherapy, and renal insufficiency - progressive secondary to GI bleeding, stable 5. History of Mild thrombocytopenia  6. History of bilateral hydronephrosis. Renal ultrasound 09/26/2014 consistent with medical renal disease. No hydronephrosis. Ureter stents removed 10/09/2016. 7. Coronary artery disease - status post coronary artery stent placement. 8. History of noncalcified lung nodules. 9. History of severe neutropenia July 2009 - likely related to delayed toxicity from rituximab. 10. Macular degeneration. 11. Right middle lobe density on a CT of the chest 06/18/2010 measuring 2.5 x 1.6 cm with mildly increased metabolic activity (SUV max 2.5) on a PET scan 06/27/2010. The area of increased density was less confluent and appeared smaller on a restaging CT 09/08/2010. Right middle lobe "scarring" with no new or suspicious pulmonary nodule on a CT 08/04/2011. 12. Mild increased metabolic activity associated with several lymph nodes on the PET scan 06/27/2010 - likely related to lymphoma. No lymphadenopathy in the mediastinum or axillary areas on the CT 08/04/2011. 13. Right hip discomfort-he received a steroid injection by his primary physician without improvement. He saw Dr. Collier Salina and there was a question of a lytic process in the right pelvis. A bone scan on 09/21/2012 revealed no evidence  of metastatic  disease. 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 Renal insufficiency-progressive CT 01/12/2016 confirmed progressive hydronephrosis  Placement of bilateral ureter stents 04/10/2016  Ureter stents removed 10/09/2016 15. Left leg DVT and pulmonary embolism 04/01/2015-on Coumadin 16. Admission 08/23/2015 with severe anemia and GI bleeding  Upper and lower endoscopy 08/26/2015 without a clear source for bleeding identified 17. Leg edema/anasarca-improved with leg wrapping and diuretics 18. Anemia secondary to chronic disease, renal failure, and possibly lymphoma 19. 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   Biopsy retroperitoneal soft tissue 05/13/2016 20. History of elevated calcium-most likely secondary to non-Hodgkin's lymphoma status post pamidronate 06/08/2016, calcitonin 06/11/2016    Disposition: Edward Woodward appears unchanged. He presents with intermittent weakness and confusion. He is alert and oriented in the office today. Labs reviewed, no obvious explanation for his symptoms. He has had recent urinary frequency. We checked a urinalysis which did not show an obvious infection. We will follow-up on the culture and initiate antibiotics as indicated. He will otherwise follow-up as scheduled. He understands to contact the office with worsening of current symptoms or any new symptoms such as fever, chills.  Patient seen with Dr. Benay Spice. 25 minutes were spent face-to-face at today's visit with the majority of that time involved in counseling/coordination of care.    [name] ANP/GNP-BC   05/21/2017  4:02 PM  This was a shared visit with [name]. Edward Woodward appears stable. He is stable from a hematologic standpoint. He may be recovering from recent upper respiratory infection or UTI. We will follow-up on the urine culture from today. His wife  will contact us if he develops new symptoms.  [name], M.D.

## 2017-05-21 NOTE — Telephone Encounter (Signed)
Call placed back to patient's wife as requested. Pt's wife states that the patient is becoming progressively weak and confusion "like he did back in 2017 when he got really sick". Pt's wife states that they would like to have "his labs checked". Pt and pt's wife agree to an appointment with [name] on 05/21/17 at 1515. Pt's wife appreciative of call back.

## 2017-05-23 ENCOUNTER — Emergency Department (HOSPITAL_COMMUNITY): Payer: Medicare Other

## 2017-05-23 ENCOUNTER — Encounter (HOSPITAL_COMMUNITY): Payer: Self-pay | Admitting: Internal Medicine

## 2017-05-23 ENCOUNTER — Emergency Department (HOSPITAL_COMMUNITY)
Admission: EM | Admit: 2017-05-23 | Discharge: 2017-05-23 | Disposition: A | Discharge status: Home / Self Care | Payer: Medicare Other | Attending: Emergency Medicine | Admitting: Emergency Medicine

## 2017-05-23 DIAGNOSIS — R350 Frequency of micturition: Secondary | ICD-10-CM | POA: Insufficient documentation

## 2017-05-23 DIAGNOSIS — Z8572 Personal history of non-Hodgkin lymphomas: Secondary | ICD-10-CM | POA: Diagnosis not present

## 2017-05-23 DIAGNOSIS — R410 Disorientation, unspecified: Secondary | ICD-10-CM | POA: Diagnosis not present

## 2017-05-23 DIAGNOSIS — I509 Heart failure, unspecified: Secondary | ICD-10-CM | POA: Diagnosis not present

## 2017-05-23 DIAGNOSIS — I2510 Atherosclerotic heart disease of native coronary artery without angina pectoris: Secondary | ICD-10-CM | POA: Diagnosis not present

## 2017-05-23 DIAGNOSIS — R05 Cough: Secondary | ICD-10-CM | POA: Diagnosis not present

## 2017-05-23 DIAGNOSIS — Z8546 Personal history of malignant neoplasm of prostate: Secondary | ICD-10-CM | POA: Insufficient documentation

## 2017-05-23 DIAGNOSIS — N183 Chronic kidney disease, stage 3 (moderate): Secondary | ICD-10-CM | POA: Insufficient documentation

## 2017-05-23 DIAGNOSIS — R0981 Nasal congestion: Secondary | ICD-10-CM | POA: Diagnosis not present

## 2017-05-23 DIAGNOSIS — R509 Fever, unspecified: Secondary | ICD-10-CM | POA: Diagnosis not present

## 2017-05-23 DIAGNOSIS — Z955 Presence of coronary angioplasty implant and graft: Secondary | ICD-10-CM | POA: Insufficient documentation

## 2017-05-23 DIAGNOSIS — Z951 Presence of aortocoronary bypass graft: Secondary | ICD-10-CM | POA: Insufficient documentation

## 2017-05-23 DIAGNOSIS — Z79899 Other long term (current) drug therapy: Secondary | ICD-10-CM | POA: Diagnosis not present

## 2017-05-23 DIAGNOSIS — Z950 Presence of cardiac pacemaker: Secondary | ICD-10-CM | POA: Insufficient documentation

## 2017-05-23 DIAGNOSIS — Z87891 Personal history of nicotine dependence: Secondary | ICD-10-CM | POA: Insufficient documentation

## 2017-05-23 DIAGNOSIS — I130 Hypertensive heart and chronic kidney disease with heart failure and stage 1 through stage 4 chronic kidney disease, or unspecified chronic kidney disease: Secondary | ICD-10-CM | POA: Insufficient documentation

## 2017-05-23 DIAGNOSIS — Z7901 Long term (current) use of anticoagulants: Secondary | ICD-10-CM | POA: Diagnosis not present

## 2017-05-23 DIAGNOSIS — R531 Weakness: Secondary | ICD-10-CM

## 2017-05-23 LAB — URINALYSIS, ROUTINE W REFLEX MICROSCOPIC
BILIRUBIN URINE: NEGATIVE
GLUCOSE, UA: NEGATIVE mg/dL
Hgb urine dipstick: NEGATIVE
KETONES UR: NEGATIVE mg/dL
LEUKOCYTES UA: NEGATIVE
Nitrite: NEGATIVE
PH: 5 (ref 5.0–8.0)
Protein, ur: 30 mg/dL — AB
SQUAMOUS EPITHELIAL / LPF: NONE SEEN
Specific Gravity, Urine: 1.02 (ref 1.005–1.030)

## 2017-05-23 LAB — COMPREHENSIVE METABOLIC PANEL
ALK PHOS: 61 U/L (ref 38–126)
ALT: 15 U/L — AB (ref 17–63)
AST: 23 U/L (ref 15–41)
Albumin: 3.4 g/dL — ABNORMAL LOW (ref 3.5–5.0)
Anion gap: 10 (ref 5–15)
BUN: 21 mg/dL — AB (ref 6–20)
CO2: 24 mmol/L (ref 22–32)
Calcium: 9.8 mg/dL (ref 8.9–10.3)
Chloride: 107 mmol/L (ref 101–111)
Creatinine, Ser: 1.4 mg/dL — ABNORMAL HIGH (ref 0.61–1.24)
GFR calc Af Amer: 50 mL/min — ABNORMAL LOW (ref >60)
GFR, EST NON AFRICAN AMERICAN: 43 mL/min — AB (ref >60)
Glucose, Bld: 123 mg/dL — ABNORMAL HIGH (ref 65–99)
POTASSIUM: 4.4 mmol/L (ref 3.5–5.1)
Sodium: 141 mmol/L (ref 135–145)
Total Bilirubin: 0.8 mg/dL (ref 0.3–1.2)
Total Protein: 6.3 g/dL — ABNORMAL LOW (ref 6.5–8.1)

## 2017-05-23 LAB — CBC WITH DIFFERENTIAL/PLATELET
BASOS ABS: 0 10*3/uL (ref 0.0–0.1)
Basophils Relative: 0 %
EOS PCT: 2 %
Eosinophils Absolute: 0.1 10*3/uL (ref 0.0–0.7)
HCT: 34.2 % — ABNORMAL LOW (ref 39.0–52.0)
HEMOGLOBIN: 11.1 g/dL — AB (ref 13.0–17.0)
LYMPHS PCT: 32 %
Lymphs Abs: 1.8 10*3/uL (ref 0.7–4.0)
MCH: 29.5 pg (ref 26.0–34.0)
MCHC: 32.5 g/dL (ref 30.0–36.0)
MCV: 91 fL (ref 78.0–100.0)
Monocytes Absolute: 0.7 10*3/uL (ref 0.1–1.0)
Monocytes Relative: 12 %
NEUTROS ABS: 3.1 10*3/uL (ref 1.7–7.7)
NEUTROS PCT: 54 %
PLATELETS: 133 10*3/uL — AB (ref 150–400)
RBC: 3.76 MIL/uL — ABNORMAL LOW (ref 4.22–5.81)
RDW: 15 % (ref 11.5–15.5)
WBC: 5.8 10*3/uL (ref 4.0–10.5)

## 2017-05-23 LAB — I-STAT TROPONIN, ED: Troponin i, poc: 0.02 ng/mL (ref 0.00–0.08)

## 2017-05-23 LAB — I-STAT CG4 LACTIC ACID, ED: Lactic Acid, Venous: 1.23 mmol/L (ref 0.5–1.9)

## 2017-05-23 LAB — URINE CULTURE: Organism ID, Bacteria: NO GROWTH

## 2017-05-23 MED ORDER — SODIUM CHLORIDE 0.9 % IV BOLUS (SEPSIS)
1000.0000 mL | Freq: Once | INTRAVENOUS | Status: AC
  Administered 2017-05-23: 1000 mL via INTRAVENOUS

## 2017-05-23 MED ORDER — CIPROFLOXACIN HCL 500 MG PO TABS: 500.0000 mg | ORAL_TABLET | Freq: Two times a day (BID) | ORAL | 0 refills | Status: AC

## 2017-05-23 NOTE — ED Notes (Signed)
Patient transported to CT 

## 2017-05-23 NOTE — ED Provider Notes (Signed)
[facility] DEPT Provider Note   CSN: [id number] Arrival date & time: 05/23/17  1630     History   Chief Complaint Chief Complaint  Patient presents with  . Fever   Level V caveat given confusion/dementia HPI Edward Woodward is a 83 y.o. male with extensive past medical history including non-Hodgkin's lymphoma on rituximab, dementia, CAD status post MI and CABG, pacemaker, DVT/PE on coumadin, renal insufficiency and urinary incontinence presents to ED for evaluation of fever, nasal congestion, rhinorrhea, productive cough, malodorous urine. Pt took temperature today and noticed fever and called [facility]. Wife at bedside reports pt has had significant worsening confusion in the last 2 weeks. Reports mechanical fall two weeks ago with head trauma, pt did not want to be evaluated after fall.  Per chart review pt was seen by oncology two days ago for a f/u eval of weakness. He reported URI symptoms, intermittent confusion, darker urine and urinary frequency for 2 weeks. U/A and culture obtained then was negative. HPI  Past Medical History:  Diagnosis Date  . AMD (age-related macular degeneration), bilateral   . Anemia   . Anginal pain ([place])    pts wife states pt had to use nitroglycerin this am 04/09/2016  . CAD (coronary artery disease)     NYHA Class 1B, CABG 1985  . CHF (congestive heart failure) ([place])   . Collagen vascular disease ([place])   . Collapsed lung    right   . Complication of anesthesia    pts wife states pt gets confused   . Degeneration of lumbar or lumbosacral intervertebral disc   . Depressive disorder, not elsewhere classified   . Dysrhythmia   . GERD (gastroesophageal reflux disease)   . History of blood transfusion   . History of hiatal hernia   . HTN (hypertension)   . Hypercholesteremia   . Lower leg edema    bilat  . Multiple falls   . Myocardial infarction ([place])   . Neuromuscular disorder (HCC)    neuropathy  .  Neuropathy   . Non Hodgkin's lymphoma ([place])   . OSA on CPAP   . PE (pulmonary embolism) 03/2015  . Presence of permanent cardiac pacemaker   . Prostate cancer ([place])   . Shortness of breath dyspnea    with exertion   . Sinoatrial node dysfunction (HCC)   . Ulcers of both lower legs (HCC)    history of   . Urinary incontinence     Patient Active Problem List   Diagnosis Date Noted  . Goals of care, counseling/discussion 09/17/2016  . Hematuria 07/16/2016  . Protein-calorie malnutrition, severe 06/15/2016  . Hypercalcemia of malignancy 06/11/2016  . Hypercalcemia 06/09/2016  . Sacral decubitus ulcer, stage III ([place]) 06/09/2016  . 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 Gait instability 03/06/2016  . Palliative care encounter   . Weakness generalized   . Supratherapeutic INR 01/13/2016  . Encephalopathy acute 01/13/2016  . Falls 01/12/2016  . Failure to thrive in adult 01/12/2016  . Retroperitoneal mass 01/12/2016  . Hydronephrosis 01/12/2016  . AKI (acute kidney injury) ([place]) 01/12/2016  . Weakness 01/12/2016  . Dehydration 12/19/2015  . Long term current use of anticoagulant therapy 12/19/2015  . Peripheral edema 12/19/2015  . Rectal bleeding 12/19/2015  . Prostate cancer ([place]) 12/19/2015  . Pain in the chest   . NSTEMI (non-ST elevated myocardial infarction) ([place])   . Iron deficiency anemia due to chronic blood loss   . Chest pain 08/23/2015  . Encounter  for chemotherapy management 05/06/2015  . Pulmonary embolus ([place]) 04/01/2015  . CKD (chronic kidney disease), stage III ([place]) 04/01/2015  . Chronic anemia 04/01/2015  . Acute respiratory failure with hypoxia ([place]) 04/01/2015  . Sinus node dysfunction chronotropic incompetence 08/23/2013  . Cardiac pacemaker -[employer] 08/23/2013  . Paroxysmal atrial fibrillation ([place]) 08/23/2013  . Dyspnea 07/10/2013  . OSA on CPAP 07/10/2013  . Peripheral vascular disease ([place]) 06/07/2013  . Atherosclerosis of coronary artery bypass graft  of native heart without angina pectoris 06/07/2013  . Hyperlipidemia 06/07/2013  . Essential hypertension, benign 06/07/2013  . Nodular lymphoma of extranodal and/or solid organ site [facility]) 07/03/2011    Past Surgical History:  Procedure Laterality Date  . APPENDECTOMY    . BACK SURGERY    . CARDIAC CATHETERIZATION N/A 08/27/2015   Procedure: Left Heart Cath and Cors/Grafts Angiography;  Surgeon: Belva Crome, MD;  Location: [facility] LAB;  Service: Cardiovascular;  Laterality: N/A;  . COLONOSCOPY N/A 08/26/2015   Procedure: COLONOSCOPY;  Surgeon: Clarene Essex, MD;  Location: [facility] ENDOSCOPY;  Service: Endoscopy;  Laterality: N/A;  . CORONARY ANGIOPLASTY WITH STENT PLACEMENT     s/p bare metal stent implant SVG-diagonal 11/23/05, s/p BM stent implantation SVG diagonal 10/22/06 (feeds LAD), and 05/2010 with DES to left circumflex  . [place]   SVG to Diagonal/LAD,  seqSVG to OM1 and OM2  . CYSTOSCOPY W/ URETERAL STENT PLACEMENT Bilateral 04/10/2016   Procedure: CYSTOSCOPY WITH RETROGRADE PYELOGRAM/URETERAL STENT PLACEMENT;  Surgeon: Festus Aloe, MD;  Location: [facility] ORS;  Service: Urology;  Laterality: Bilateral;  . CYSTOSCOPY W/ URETERAL STENT PLACEMENT Bilateral 10/09/2016   Procedure: CYSTOSCOPY WITH BILATERAL RETROGRADE PYELOGRAM [name]  URETERAL STENT REMOVAL;  Surgeon: Festus Aloe, MD;  Location: [facility] ORS;  Service: Urology;  Laterality: Bilateral;  . ENDARTERECTOMY     bilat   . ESOPHAGOGASTRODUODENOSCOPY (EGD) WITH PROPOFOL N/A 08/26/2015   Procedure: ESOPHAGOGASTRODUODENOSCOPY (EGD) WITH PROPOFOL;  Surgeon: Clarene Essex, MD;  Location[facility] ENDOSCOPY;  Service: Endoscopy;  Laterality: N/A;  . EXPLORATORY LAPAROTOMY    . LUMBAR FUSION    . PACEMAKER INSERTION    . ROTATOR CUFF REPAIR     bilat  . TONSILLECTOMY         Home Medications    Prior to Admission medications   Medication Sig Start Date End Date Taking? Authorizing  Provider  abiraterone Acetate (ZYTIGA) 250 MG tablet Take 4 tablets (1,000 mg total) by mouth daily. Take on an empty stomach 1 hour before or 2 hours after a meal 01/23/16  Yes Ladell Pier, MD  acetaminophen (TYLENOL) 500 MG tablet Take 1,000 mg by mouth every 6 (six) hours as needed for mild pain, moderate pain or headache.    Yes [provider]  atorvastatin (LIPITOR) 40 MG tablet TAKE ONE TABLET BY MOUTH EVERY OTHER DAY 09/16/16  Yes Belva Crome, MD  Chlorpheniramine-Acetaminophen (CORICIDIN HBP COLD/FLU PO) Take 2 tablets by mouth 2 (two) times daily.   Yes [provider]  citalopram (CELEXA) 20 MG tablet Take 20 mg by mouth daily.    Yes [provider]  clonazePAM (KLONOPIN) 0.25 MG disintegrating tablet Take 1 tablet (0.25 mg total) by mouth 2 (two) times daily. 12/25/16  Yes Patel, Donika K, DO  fluticasone (FLONASE) 50 MCG/ACT nasal spray Place 1 spray into both nostrils daily as needed for allergies or rhinitis.    Yes [provider]  hydrocortisone  2.5 % ointment Apply 1 application topically 2 (two) times daily as needed. Applied to affected area(s) of skin( pressure sores) 08/25/16  Yes [provider]  isosorbide mononitrate (IMDUR) 30 MG 24 hr tablet Take 1 tablet (30 mg total) by mouth at bedtime. 03/22/17  Yes Belva Crome, MD  Melatonin 3 MG TABS Take 3 mg by mouth at bedtime.   Yes [provider]  metoprolol succinate (TOPROL-XL) 50 MG 24 hr tablet TAKE ONE TABLET BY MOUTH ONCE DAILY WITH MEAL OR IMMEDIATELY FOLLOWING Patient taking differently: half tablet PO daily 01/22/17  Yes Deboraha Sprang, MD  Multiple Vitamins-Minerals (ICAPS AREDS 2 PO) Take 2 tablets by mouth every morning.   Yes [provider]  nitroGLYCERIN (NITROLINGUAL) 0.4 MG/SPRAY spray Place 1 spray under the tongue every 5 (five) minutes x 3 doses as needed for chest pain. 08/28/15  Yes Simmons, Brittainy M, PA-C  ondansetron (ZOFRAN) 4 MG tablet  Take 1 tablet (4 mg total) by mouth every 8 (eight) hours as needed for nausea or vomiting. 06/17/16  Yes Ladell Pier, MD  Polyethyl Glycol-Propyl Glycol (SYSTANE) 0.4-0.3 % SOLN Place 1 drop into both eyes at bedtime.    Yes [provider]  polyethylene glycol (MIRALAX / GLYCOLAX) packet Take 17 g by mouth daily as needed.   Yes [provider]  predniSONE (DELTASONE) 5 MG tablet TAKE 1 TABLET BY MOUTH ONCE DAILY 04/05/17  Yes Ladell Pier, MD  torsemide (DEMADEX) 20 MG tablet Take 20 mg by mouth daily.    Yes [provider]  triamcinolone ointment (KENALOG) 0.1 % Apply 1 application topically See admin instructions. Use once to twice daily applied to affected area(s) of skin. 08/25/16  Yes [provider]  vitamin B-12 (CYANOCOBALAMIN) 1000 MCG tablet Take 1,000 mcg by mouth at bedtime.    Yes [provider]  warfarin (COUMADIN) 5 MG tablet Take 1 tablet (5 mg total) by mouth daily at 6 PM. Except every MWF take half tablet (2.5 mg) 03/30/17  Yes Ladell Pier, MD  ciprofloxacin (CIPRO) 500 MG tablet Take 1 tablet (500 mg total) by mouth 2 (two) times daily. 05/23/17 05/26/17  Kinnie Feil, PA-C  traMADol (ULTRAM) 50 MG tablet Take 1 tablet (50 mg total) by mouth every 6 (six) hours as needed. Patient not taking: Reported on 05/23/2017 10/09/16   Festus Aloe, MD    Family History Family History  Problem Relation Age of Onset  . Cancer Mother   . Heart disease Father 56  . Heart disease Brother     Social History Social History  Substance Use Topics  . Smoking status: Former Smoker    Packs/day: 0.50    Years: 5.00    Types: Cigarettes    Quit date: 07/28/1955  . Smokeless tobacco: Never Used  . Alcohol use No     Allergies   Penicillins and Simvastatin   Review of Systems Review of Systems  Unable to perform ROS: Other (confusion)  Constitutional: Positive for fever. Negative for appetite change and chills.    HENT: Positive for congestion, postnasal drip and rhinorrhea. Negative for sore throat.   Eyes: Negative for visual disturbance.  Respiratory: Positive for cough. Negative for shortness of breath.   Cardiovascular: Negative for chest pain.  Gastrointestinal: Negative for abdominal pain, blood in stool, diarrhea, nausea and vomiting.  Genitourinary: Positive for frequency. Negative for dysuria, flank pain and hematuria.  Skin: Negative for rash.  Allergic/Immunologic: Positive  for immunocompromised state.  Neurological: Negative for dizziness and headaches.  Psychiatric/Behavioral: Positive for confusion.     Physical Exam Updated Vital Signs BP (!) 159/65   Pulse 65   Temp 99.3 F (37.4 C) (Rectal) Comment: last tylenol @ 16:30  Resp (!) 21   Ht 5\' 10"  (1.778 m)   Wt 86.6 kg (191 lb)   SpO2 98%   BMI 27.41 kg/m   Physical Exam  Constitutional: He is oriented to person, place, and time. He appears well-developed and well-nourished. No distress.  HENT:  Head: Normocephalic and atraumatic.  Nose: Nose normal.  Mouth/Throat: No oropharyngeal exudate.  Dry lips, moist buccal mucosa and tongue   Eyes: Pupils are equal, round, and reactive to light. Conjunctivae and EOM are normal.  Neck: Normal range of motion. Neck supple.  No cervical adenopathy Neck supple with painless PROM  Cardiovascular: Normal rate, regular rhythm, normal heart sounds and intact distal pulses.   No murmur heard. Pulmonary/Chest: Effort normal. No respiratory distress. He has decreased breath sounds in the right lower field and the left lower field. He has no wheezes. He has no rales.  Decreased breath sounds bilateral lower lobes  Abdominal: Soft. Bowel sounds are normal. He exhibits no distension. There is no tenderness.  Musculoskeletal: Normal range of motion. He exhibits no deformity.  Neurological: He is alert and oriented to person, place, and time.  Speech and phonation normal.  Strength 5/5  with hand grip and ankle flexion/extension.   Sensation to light touch intact in hands and feet. No trunk sway. CN I and VIII not tested. CN II-XII intact bilaterally.   Skin: Skin is warm and dry. Capillary refill takes less than 2 seconds.  Psychiatric: He has a normal mood and affect. His behavior is normal. Judgment and thought content normal.  Nursing note and vitals reviewed.    ED Treatments / Results  Labs (all labs ordered are listed, but only abnormal results are displayed) Labs Reviewed  COMPREHENSIVE METABOLIC PANEL - Abnormal; Notable for the following:       Result Value   Glucose, Bld 123 (*)    BUN 21 (*)    Creatinine, Ser 1.40 (*)    Total Protein 6.3 (*)    Albumin 3.4 (*)    ALT 15 (*)    GFR calc non Af Amer 43 (*)    GFR calc Af Amer 50 (*)    All other components within normal limits  CBC WITH DIFFERENTIAL/PLATELET - Abnormal; Notable for the following:    RBC 3.76 (*)    Hemoglobin 11.1 (*)    HCT 34.2 (*)    Platelets 133 (*)    All other components within normal limits  URINALYSIS, ROUTINE W REFLEX MICROSCOPIC - Abnormal; Notable for the following:    Protein, ur 30 (*)    Bacteria, UA RARE (*)    All other components within normal limits  URINE CULTURE  CULTURE, BLOOD (ROUTINE X 2)  CULTURE, BLOOD (ROUTINE X 2)  I-STAT CG4 LACTIC ACID, ED  I-STAT TROPONIN, ED    EKG  EKG Interpretation None       Radiology Dg Chest 2 View  Result Date: 05/23/2017 CLINICAL DATA:  Fever, nonproductive cough, and increased confusion for 1 week. EXAM: CHEST  2 VIEW COMPARISON:  06/09/2016 FINDINGS: Heart size remains within normal limits. Two lead transvenous pacemaker in stable position. Ectasia and atherosclerotic calcification of thoracic aorta remains stable. Prior CABG again noted. Stable mild left  basilar pleural thickening. No evidence of pleural effusion or pneumothorax. No evidence of pulmonary infiltrate or edema. IMPRESSION: Stable exam.  No  active cardiopulmonary disease. Electronically Signed   By: Earle Gell M.D.   On: 05/23/2017 18:32   Ct Head Wo Contrast  Result Date: 05/23/2017 CLINICAL DATA:  83 year old male with increased confusion and UTI symptoms. EXAM: CT HEAD WITHOUT CONTRAST TECHNIQUE: Contiguous axial images were obtained from the base of the skull through the vertex without intravenous contrast. COMPARISON:  Head CT dated 03/23/2016 FINDINGS: Brain: There is moderate age-related atrophy and chronic microvascular ischemic changes. An area of old infarct and encephalomalacia noted in the right occipital lobe. There is no acute intracranial hemorrhage. No mass effect or midline shift noted. No extra-axial fluid collection. Vascular: Slight high attenuation of the MCAs bilaterally likely related to hemoconcentration. There is atherosclerotic calcification of the vertebral artery at the foramen magnum. Skull: Normal. Negative for fracture or focal lesion. Sinuses/Orbits: There is diffuse mucoperiosteal thickening of paranasal sinuses with opacification of the ethmoid air cells. Bilateral cataract surgeries noted. The mastoid air cells are clear. Other: None IMPRESSION: 1. No acute intracranial hemorrhage. 2. Moderate age-related atrophy and chronic microvascular ischemic changes. If symptoms persist, and there are no contraindications, MRI may provide better evaluation if clinically indicated. 3. Paranasal sinus disease. Electronically Signed   By: Anner Crete M.D.   On: 05/23/2017 19:41    Procedures Procedures (including critical care time)  Medications Ordered in ED Medications  sodium chloride 0.9 % bolus 1,000 mL (0 mLs Intravenous Stopped 05/23/17 2137)     Initial Impression / Assessment and Plan / ED Course  I have reviewed the triage vital signs and the nursing notes.  Pertinent labs & imaging results that were available during my care of the patient were reviewed by me and considered in my medical decision  making (see chart for details).  Clinical Course as of May 24 2147  Nancy Fetter May 23, 2017  2050 Creatinine: (!) 1.40 [CG]  2050 BUN: (!) 21 [CG]  2050 Hemoglobin: (!) 11.1 [CG]  2128 EMT ambulated pt who did well. He ambulated on his own with walker w/ normal vital signs   [CG]    Clinical Course User Index [CG] Kinnie Feil, PA-C   83 year old male with history of non-Hodgkin's lymphoma on rituximab presents to ED for fever noticed today. Associated symptoms include URI symptoms 1 week,malodorous urine and increased confusion 2 weeks after mechanical fall with witnessed head trauma. He is on Coumadin.  On exam patient is oriented 3. VS are WNL. Exam is reassuring.  Lab work today is mostly reassuring. Slightly elevated Cr. Hemoglobin 11.1, comparable to previous. No leukocytosis, electrolyte abnormalities, lactic acid normal. CXR, U/A and CT head normal.   Final Clinical Impressions(s) / ED Diagnoses   Laboratory is reassuring. No evidence of infection and urinalysis or chest x-ray. CT head normal. Patient ambulated at baseline in the ED with normal vital signs. He has follow-up with PCP in 2 days. Given pmh of immunocompromised state and reported fever will consult oncology for further recs.   Spoke to Dr Jana Hakim who is agreeable with d/c with close clinic f/u +/- ciprofloxacin.  Will d/c at this time. Pt and family agreeable.  Final diagnoses:  Generalized weakness    New Prescriptions New Prescriptions   CIPROFLOXACIN (CIPRO) 500 MG TABLET    Take 1 tablet (500 mg total) by mouth 2 (two) times daily.     Carmon Sails  J, PA-C 05/23/17 2148    Fredia Sorrow, MD 05/23/17 2224

## 2017-05-23 NOTE — Discharge Instructions (Addendum)
You presented to the ED for low-grade fever, generalized weakness. Your lab work, chest x-ray, EKG, urinalysis and CT scan head were normal. We spoke to Dr.Magrinat from oncology clinic who recommends discharge and follow-up in clinic as soon as possible. Please call their walk-in clinic tomorrow morning for an appointment. Given your history of immunosuppression, we will start a short course of antibiotics (ciprofloxacin) for three days.

## 2017-05-23 NOTE — ED Notes (Addendum)
Pt was ambulated using a walker on pulse oximetry without assistance. Oxygen saturation remained around 94%. Pt had steady gait, and was more than willing to walk further if necessary.

## 2017-05-23 NOTE — ED Triage Notes (Signed)
Pt has had increased confusion for a week and UTI symptoms (discolored urine/odorous urine). Family got oral temp and saw he had a fever today. Pt given 1g acetaminophen with [facility]. Alert and oriented x3 (disoriented to time).

## 2017-05-23 NOTE — ED Notes (Signed)
Pt transported to xray 

## 2017-05-23 NOTE — ED Notes (Signed)
Bed: [facility] Expected date:  Expected time:  Means of arrival:  Comments: [facility] Pt

## 2017-05-24 ENCOUNTER — Encounter: Payer: Self-pay | Admitting: Oncology

## 2017-05-24 ENCOUNTER — Telehealth: Payer: Self-pay | Admitting: *Deleted

## 2017-05-24 NOTE — Telephone Encounter (Signed)
Called pt's wife to follow up after ED visit on 10/28. She reports he's been sluggish and sleeping a lot today. They've scheduled an appointment with Dr. Felipa Eth 10/30 for his sore mouth. Wife reports he is not as confused today. Dr. Benay Spice made aware.

## 2017-05-25 DIAGNOSIS — R630 Anorexia: Secondary | ICD-10-CM | POA: Diagnosis not present

## 2017-05-25 DIAGNOSIS — E7800 Pure hypercholesterolemia, unspecified: Secondary | ICD-10-CM | POA: Diagnosis not present

## 2017-05-25 DIAGNOSIS — C8590 Non-Hodgkin lymphoma, unspecified, unspecified site: Secondary | ICD-10-CM | POA: Diagnosis not present

## 2017-05-25 DIAGNOSIS — I700 Atherosclerosis of aorta: Secondary | ICD-10-CM | POA: Diagnosis not present

## 2017-05-25 DIAGNOSIS — K130 Diseases of lips: Secondary | ICD-10-CM | POA: Diagnosis not present

## 2017-05-25 DIAGNOSIS — J018 Other acute sinusitis: Secondary | ICD-10-CM | POA: Diagnosis not present

## 2017-05-25 DIAGNOSIS — I129 Hypertensive chronic kidney disease with stage 1 through stage 4 chronic kidney disease, or unspecified chronic kidney disease: Secondary | ICD-10-CM | POA: Diagnosis not present

## 2017-05-25 LAB — URINE CULTURE: CULTURE: NO GROWTH

## 2017-05-29 LAB — CULTURE, BLOOD (ROUTINE X 2)
Culture: NO GROWTH
Culture: NO GROWTH
Special Requests: ADEQUATE
Special Requests: ADEQUATE

## 2017-06-08 ENCOUNTER — Other Ambulatory Visit (HOSPITAL_BASED_OUTPATIENT_CLINIC_OR_DEPARTMENT_OTHER): Payer: Medicare Other

## 2017-06-08 DIAGNOSIS — Z8546 Personal history of malignant neoplasm of prostate: Secondary | ICD-10-CM

## 2017-06-08 DIAGNOSIS — Z86718 Personal history of other venous thrombosis and embolism: Secondary | ICD-10-CM

## 2017-06-08 DIAGNOSIS — Z7901 Long term (current) use of anticoagulants: Secondary | ICD-10-CM | POA: Diagnosis not present

## 2017-06-08 DIAGNOSIS — Z8572 Personal history of non-Hodgkin lymphomas: Secondary | ICD-10-CM

## 2017-06-08 DIAGNOSIS — I480 Paroxysmal atrial fibrillation: Secondary | ICD-10-CM

## 2017-06-08 DIAGNOSIS — C8299 Follicular lymphoma, unspecified, extranodal and solid organ sites: Secondary | ICD-10-CM

## 2017-06-08 LAB — PROTIME-INR
INR: 2.1 (ref 2.00–3.50)
PROTIME: 25.2 s — AB (ref 10.6–13.4)

## 2017-06-20 NOTE — Progress Notes (Signed)
Cardiology Office Note    Date:  06/21/2017   ID:  Edward Woodward, DOB 1926/12/14, MRN [id number]  PCP:  Edward Manes, MD  Cardiologist: Edward Grooms, MD   Chief Complaint  Patient presents with  . Atrial Fibrillation  . Congestive Heart Failure    History of Present Illness:  Edward Woodward is a 83 y.o. male who presents for Chronic systolic/diastolic heart failure, permanent pacemaker insertion, atrial fibrillation, hypertension, history of pulmonary emboli, chronic venous insufficiency with peripheral edema, and obstructive sleep apnea.  He is doing well.  Both he and his wife were concerned because he has intermittent confusion with confusion.  His wife feels that the confusion particularly involves her when he gets her mixed up with an imaginary person that takes care of hiIM.  He has not had syncope but did have a fall earlier this year   Past Medical History:  Diagnosis Date  . AMD (age-related macular degeneration), bilateral   . Anemia   . Anginal pain ([place])    pts wife states pt had to use nitroglycerin this am 04/09/2016  . CAD (coronary artery disease)     NYHA Class 1B, CABG 1985  . CHF (congestive heart failure) ([place])   . Collagen vascular disease ([place])   . Collapsed lung    right   . Complication of anesthesia    pts wife states pt gets confused   . Degeneration of lumbar or lumbosacral intervertebral disc   . Depressive disorder, not elsewhere classified   . Dysrhythmia   . GERD (gastroesophageal reflux disease)   . History of blood transfusion   . History of hiatal hernia   . HTN (hypertension)   . Hypercholesteremia   . Lower leg edema    bilat  . Multiple falls   . Myocardial infarction ([place])   . Neuromuscular disorder (HCC)    neuropathy  . Neuropathy   . Non Hodgkin's lymphoma ([place])   . OSA on CPAP   . PE (pulmonary embolism) 03/2015  . Presence of permanent cardiac pacemaker   . Prostate cancer ([place])   . Shortness of breath  dyspnea    with exertion   . Sinoatrial node dysfunction (HCC)   . Ulcers of both lower legs (HCC)    history of   . Urinary incontinence     Past Surgical History:  Procedure Laterality Date  . APPENDECTOMY    . BACK SURGERY    . CARDIAC CATHETERIZATION N/A 08/27/2015   Procedure: Left Heart Cath and Cors/Grafts Angiography;  Surgeon: Belva Crome, MD;  Location: [facility];  Service: Cardiovascular;  Laterality: N/A;  . COLONOSCOPY N/A 08/26/2015   Procedure: COLONOSCOPY;  Surgeon: Clarene Essex, MD;  [facility] ENDOSCOPY;  Service: Endoscopy;  Laterality: N/A;  . CORONARY ANGIOPLASTY WITH STENT PLACEMENT     s/p bare metal stent implant SVG-diagonal 11/23/05, s/p BM stent implantation SVG diagonal 10/22/06 (feeds LAD), and 05/2010 with DES to left circumflex  . [place]   SVG to Diagonal/LAD,  seqSVG to OM1 and OM2  . CYSTOSCOPY W/ URETERAL STENT PLACEMENT Bilateral 04/10/2016   Procedure: CYSTOSCOPY WITH RETROGRADE PYELOGRAM/URETERAL STENT PLACEMENT;  Surgeon: Festus Aloe, MD;  Location: [facility];  Service: Urology;  Laterality: Bilateral;  . CYSTOSCOPY W/ URETERAL STENT PLACEMENT Bilateral 10/09/2016   Procedure: CYSTOSCOPY WITH BILATERAL RETROGRADE PYELOGRAM [name]  URETERAL STENT REMOVAL;  Surgeon: Festus Aloe, MD;  Location: [facility];  Service: Urology;  Laterality: Bilateral;  . ENDARTERECTOMY     bilat   . ESOPHAGOGASTRODUODENOSCOPY (EGD) WITH PROPOFOL N/A 08/26/2015   Procedure: ESOPHAGOGASTRODUODENOSCOPY (EGD) WITH PROPOFOL;  Surgeon: Clarene Essex, MD;  Location: [facility] ENDOSCOPY;  Service: Endoscopy;  Laterality: N/A;  . EXPLORATORY LAPAROTOMY    . LUMBAR FUSION    . PACEMAKER INSERTION    . ROTATOR CUFF REPAIR     bilat  . TONSILLECTOMY      Current Medications: Outpatient Medications Prior to Visit  Medication Sig Dispense Refill  . abiraterone Acetate (ZYTIGA) 250 MG tablet Take 4 tablets (1,000 mg total) by mouth  daily. Take on an empty stomach 1 hour before or 2 hours after a meal 120 tablet 1  . acetaminophen (TYLENOL) 500 MG tablet Take 1,000 mg by mouth every 6 (six) hours as needed for mild pain, moderate pain or headache.     . Chlorpheniramine-Acetaminophen (CORICIDIN HBP COLD/FLU PO) Take 2 tablets by mouth 2 (two) times daily.    . citalopram (CELEXA) 20 MG tablet Take 20 mg by mouth daily.     . clonazePAM (KLONOPIN) 0.25 MG disintegrating tablet Take 1 tablet (0.25 mg total) by mouth 2 (two) times daily. 60 tablet 5  . fluticasone (FLONASE) 50 MCG/ACT nasal spray Place 1 spray into both nostrils daily as needed for allergies or rhinitis.     . hydrocortisone 2.5 % ointment Apply 1 application topically 2 (two) times daily as needed. Applied to affected area(s) of skin( pressure sores)    . isosorbide mononitrate (IMDUR) 30 MG 24 hr tablet Take 1 tablet (30 mg total) by mouth at bedtime. 90 tablet 2  . Melatonin 3 MG TABS Take 3 mg by mouth at bedtime.    . metoprolol succinate (TOPROL-XL) 50 MG 24 hr tablet TAKE ONE TABLET BY MOUTH ONCE DAILY WITH MEAL OR IMMEDIATELY FOLLOWING (Patient taking differently: half tablet PO daily) 90 tablet 3  . Multiple Vitamins-Minerals (ICAPS AREDS 2 PO) Take 2 tablets by mouth every morning.    . nitroGLYCERIN (NITROLINGUAL) 0.4 MG/SPRAY spray Place 1 spray under the tongue every 5 (five) minutes x 3 doses as needed for chest pain. 12 g 3  . ondansetron (ZOFRAN) 4 MG tablet Take 1 tablet (4 mg total) by mouth every 8 (eight) hours as needed for nausea or vomiting. 20 tablet 0  . Polyethyl Glycol-Propyl Glycol (SYSTANE) 0.4-0.3 % SOLN Place 1 drop into both eyes at bedtime.     . polyethylene glycol (MIRALAX / GLYCOLAX) packet Take 17 g by mouth daily as needed.    . predniSONE (DELTASONE) 5 MG tablet TAKE 1 TABLET BY MOUTH ONCE DAILY 90 tablet 0  . torsemide (DEMADEX) 20 MG tablet Take 20 mg by mouth daily.     . traMADol (ULTRAM) 50 MG tablet Take 1 tablet (50 mg  total) by mouth every 6 (six) hours as needed. 30 tablet 0  . triamcinolone ointment (KENALOG) 0.1 % Apply 1 application topically See admin instructions. Use once to twice daily applied to affected area(s) of skin.    Marland Kitchen vitamin B-12 (CYANOCOBALAMIN) 1000 MCG tablet Take 1,000 mcg by mouth at bedtime.     Marland Kitchen warfarin (COUMADIN) 5 MG tablet Take 1 tablet (5 mg total) by mouth daily at 6 PM. Except every MWF take half tablet (2.5 mg) 30 tablet 1  . atorvastatin (LIPITOR) 40 MG tablet TAKE ONE TABLET BY MOUTH EVERY OTHER DAY 30 tablet 11  No facility-administered medications prior to visit.      Allergies:   Penicillins and Simvastatin   Social History   Socioeconomic History  . Marital status: Married    Spouse name: None  . Number of children: 3  . Years of education: None  . Highest education level: None  Social Needs  . Financial resource strain: None  . Food insecurity - worry: None  . Food insecurity - inability: None  . Transportation needs - medical: None  . Transportation needs - non-medical: None  Occupational History  . Occupation: Retired Optometrist  Tobacco Use  . Smoking status: Former Smoker    Packs/day: 0.50    Years: 5.00    Pack years: 2.50    Types: Cigarettes    Last attempt to quit: 07/28/1955    Years since quitting: 61.9  . Smokeless tobacco: Never Used  Substance and Sexual Activity  . Alcohol use: No  . Drug use: No  . Sexual activity: No  Other Topics Concern  . None  Social History Narrative   Lives with wife in a one story home.  Has 3 children and 2 stepchildren.  Retired Optometrist.  Education: college.     Family History:  The patient's family history includes Cancer in his mother; Heart disease in his brother; Heart disease (age of onset: 53) in his father.   ROS:   Please see the history of present illness.    He has decreased hearing, vision disturbance, confusion, cough, constipation, easy bruising fever.  He was seen in the emergency  room. All other systems reviewed and are negative.   PHYSICAL EXAM:   VS:  BP 130/68   Pulse 64   Ht 5\' 10"  (1.778 m)   Wt 185 lb 12.8 oz (84.3 kg)   BMI 26.66 kg/m    GEN: Well nourished, well developed, in no acute distress.  Elderly and frail.  Sitting in a wheelchair.  Unable to stand. HEENT: normal  Neck: no JVD, carotid bruits, or masses Cardiac: RRR; no murmurs, rubs, or gallops.  Trace bilateral pedal edema. Respiratory:  clear to auscultation bilaterally, normal work of breathing GI: soft, nontender, nondistended, + BS MS: no deformity or atrophy  Skin: warm and dry, no rash Neuro:  Alert and Oriented x 3, Strength and sensation are intact Psych: euthymic mood, full affect  Wt Readings from Last 3 Encounters:  06/21/17 185 lb 12.8 oz (84.3 kg)  05/23/17 191 lb (86.6 kg)  05/21/17 189 lb (85.7 kg)      Studies/Labs Reviewed:   EKG:  EKG atrial pacing in April 2018.  No other significant abnormality noted.  Recent Labs: 06/29/2016: Brain Natriuretic Peptide 234.2 05/23/2017: ALT 15; BUN 21; Creatinine, Ser 1.40; Hemoglobin 11.1; Platelets 133; Potassium 4.4; Sodium 141   Lipid Panel    Component Value Date/Time   CHOL 139 08/24/2015 0343   TRIG 134 08/24/2015 0343   HDL 32 (L) 08/24/2015 0343   CHOLHDL 4.3 08/24/2015 0343   VLDL 27 08/24/2015 0343   LDLCALC 80 08/24/2015 0343    Additional studies/ records that were reviewed today include:  Head CT April 2018: IMPRESSION: 1. No acute intracranial hemorrhage. 2. Moderate age-related atrophy and chronic microvascular ischemic changes. If symptoms persist, and there are no contraindications, MRI may provide better evaluation if clinically indicated. 3. Paranasal sinus disease.   ASSESSMENT:    1. Atherosclerosis of coronary artery bypass graft of native heart without angina pectoris   2. Essential hypertension,  benign   3. Paroxysmal atrial fibrillation (HCC)   4. OSA on CPAP   5. Chronic diastolic  heart failure ([place])   6. Confusion      PLAN:  In order of problems listed above:  1. Stable without current anginal complaints. 2. Blood pressures well controlled. 3. Rhythm is regular currently.  Assume atrial paced rhythm. 4. Wears CPAP. 5. No evidence of volume overload. 6. Confusion gestational progression of organic brain issues/dementia.  Monitor for any acute cardiac issues.  We will see again in 1 year.  No changes in current therapy.  Medication Adjustments/Labs and Tests Ordered: Current medicines are reviewed at length with the patient today.  Concerns regarding medicines are outlined above.  Medication changes, Labs and Tests ordered today are listed in the Patient Instructions below. Patient Instructions  Medication Instructions:  Your physician recommends that you continue on your current medications as directed. Please refer to the Current Medication list given to you today.  Labwork: None  Testing/Procedures: None  Follow-Up: Your physician wants you to follow-up in: 9-12 months with Dr. Tamala Julian.  You will receive a reminder letter in the mail two months in advance. If you don't receive a letter, please call our office to schedule the follow-up appointment.   Any Other Special Instructions Will Be Listed Below (If Applicable).     If you need a refill on your cardiac medications before your next appointment, please call your pharmacy.      Signed, Edward Grooms, MD  06/21/2017 3:59 PM    [facility] [place], [place], [place]  [address] Phone: [phone]; Fax: [phone]

## 2017-06-21 ENCOUNTER — Ambulatory Visit (INDEPENDENT_AMBULATORY_CARE_PROVIDER_SITE_OTHER): Payer: Medicare Other | Admitting: Internal Medicine

## 2017-06-21 ENCOUNTER — Ambulatory Visit (INDEPENDENT_AMBULATORY_CARE_PROVIDER_SITE_OTHER): Payer: Medicare Other | Admitting: Interventional Cardiology

## 2017-06-21 ENCOUNTER — Encounter: Payer: Self-pay | Admitting: Interventional Cardiology

## 2017-06-21 ENCOUNTER — Ambulatory Visit (INDEPENDENT_AMBULATORY_CARE_PROVIDER_SITE_OTHER): Payer: Medicare Other | Admitting: *Deleted

## 2017-06-21 VITALS — BP 130/68 | HR 64 | Ht 70.0 in | Wt 185.8 lb

## 2017-06-21 DIAGNOSIS — I10 Essential (primary) hypertension: Secondary | ICD-10-CM | POA: Diagnosis not present

## 2017-06-21 DIAGNOSIS — I480 Paroxysmal atrial fibrillation: Secondary | ICD-10-CM

## 2017-06-21 DIAGNOSIS — I495 Sick sinus syndrome: Secondary | ICD-10-CM | POA: Diagnosis not present

## 2017-06-21 DIAGNOSIS — I25810 Atherosclerosis of coronary artery bypass graft(s) without angina pectoris: Secondary | ICD-10-CM

## 2017-06-21 DIAGNOSIS — R410 Disorientation, unspecified: Secondary | ICD-10-CM | POA: Diagnosis not present

## 2017-06-21 DIAGNOSIS — I5032 Chronic diastolic (congestive) heart failure: Secondary | ICD-10-CM

## 2017-06-21 DIAGNOSIS — Z950 Presence of cardiac pacemaker: Secondary | ICD-10-CM | POA: Diagnosis not present

## 2017-06-21 DIAGNOSIS — G4733 Obstructive sleep apnea (adult) (pediatric): Secondary | ICD-10-CM

## 2017-06-21 DIAGNOSIS — Z9989 Dependence on other enabling machines and devices: Secondary | ICD-10-CM

## 2017-06-21 NOTE — Progress Notes (Signed)
Pacemaker check in clinic. Normal device function. Thresholds, sensing, impedances consistent with previous measurements. Device programmed to maximize longevity. No mode switch or high ventricular rates noted. Device programmed at appropriate safety margins. Histogram distribution appropriate for patient activity level. Device programmed to optimize intrinsic conduction. Battery at ERT since 05/01/17. Patient education completed. Gen change scheduled for 07/09/17.

## 2017-06-21 NOTE — Patient Instructions (Signed)
Medication Instructions:  Your physician recommends that you continue on your current medications as directed. Please refer to the Current Medication list given to you today.  Labwork: None  Testing/Procedures: None  Follow-Up: Your physician wants you to follow-up in: 9-12 months with Dr. Smith.  You will receive a reminder letter in the mail two months in advance. If you don't receive a letter, please call our office to schedule the follow-up appointment.   Any Other Special Instructions Will Be Listed Below (If Applicable).     If you need a refill on your cardiac medications before your next appointment, please call your pharmacy.   

## 2017-06-21 NOTE — Progress Notes (Signed)
Patient Care Team: Lajean Manes, MD as PCP - General (Internal Medicine) Ladell Pier, MD as Attending Physician (Hematology and Oncology) Myrlene Broker, MD as Attending Physician (Urology)   HPI  Edward Woodward is a 83 y.o. male Seen to establish pacemaker followup needed number of years ago for sinus node dysfunction and presyncope. NO interval syncope  He also has a history of coronary artery disease with mild left ventricular dysfunction with EF of 50% 2011.  He has no history of atrial fibrillation x as  documented time of his pneumothorax that was a consequence of a fall 12/14.  Hx of PEmboli; he has been off coumadin in the past without untowards  He is enduring his cancer and chemo  Ambutlates with difficulty  Chronic sob and fatigue     Past Medical History:  Diagnosis Date  . AMD (age-related macular degeneration), bilateral   . Anemia   . Anginal pain ([place])    pts wife states pt had to use nitroglycerin this am 04/09/2016  . CAD (coronary artery disease)     NYHA Class 1B, CABG 1985  . CHF (congestive heart failure) ([place])   . Collagen vascular disease ([place])   . Collapsed lung    right   . Complication of anesthesia    pts wife states pt gets confused   . Degeneration of lumbar or lumbosacral intervertebral disc   . Depressive disorder, not elsewhere classified   . Dysrhythmia   . GERD (gastroesophageal reflux disease)   . History of blood transfusion   . History of hiatal hernia   . HTN (hypertension)   . Hypercholesteremia   . Lower leg edema    bilat  . Multiple falls   . Myocardial infarction ([place])   . Neuromuscular disorder (HCC)    neuropathy  . Neuropathy   . Non Hodgkin's lymphoma ([place])   . OSA on CPAP   . PE (pulmonary embolism) 03/2015  . Presence of permanent cardiac pacemaker   . Prostate cancer ([place])   . Shortness of breath dyspnea    with exertion   . Sinoatrial node dysfunction (HCC)   . Ulcers of both lower  legs (HCC)    history of   . Urinary incontinence     Past Surgical History:  Procedure Laterality Date  . APPENDECTOMY    . BACK SURGERY    . CARDIAC CATHETERIZATION N/A 08/27/2015   Procedure: Left Heart Cath and Cors/Grafts Angiography;  Surgeon: Belva Crome, MD;  Location: [facility];  Service: Cardiovascular;  Laterality: N/A;  . COLONOSCOPY N/A 08/26/2015   Procedure: COLONOSCOPY;  Surgeon: Clarene Essex, MD;  Location: [facility] ENDOSCOPY;  Service: Endoscopy;  Laterality: N/A;  . CORONARY ANGIOPLASTY WITH STENT PLACEMENT     s/p bare metal stent implant SVG-diagonal 11/23/05, s/p BM stent implantation SVG diagonal 10/22/06 (feeds LAD), and 05/2010 with DES to left circumflex  . [place]   SVG to Diagonal/LAD,  seqSVG to OM1 and OM2  . CYSTOSCOPY W/ URETERAL STENT PLACEMENT Bilateral 04/10/2016   Procedure: CYSTOSCOPY WITH RETROGRADE PYELOGRAM/URETERAL STENT PLACEMENT;  Surgeon: Festus Aloe, MD;  Location: [facility];  Service: Urology;  Laterality: Bilateral;  . CYSTOSCOPY W/ URETERAL STENT PLACEMENT Bilateral 10/09/2016   Procedure: CYSTOSCOPY WITH BILATERAL RETROGRADE PYELOGRAM [name]  URETERAL STENT REMOVAL;  Surgeon: Festus Aloe, MD;  Location: [facility];  Service: Urology;  Laterality: Bilateral;  .  ENDARTERECTOMY     bilat   . ESOPHAGOGASTRODUODENOSCOPY (EGD) WITH PROPOFOL N/A 08/26/2015   Procedure: ESOPHAGOGASTRODUODENOSCOPY (EGD) WITH PROPOFOL;  Surgeon: Clarene Essex, MD;  Location[facility] ENDOSCOPY;  Service: Endoscopy;  Laterality: N/A;  . EXPLORATORY LAPAROTOMY    . LUMBAR FUSION    . PACEMAKER INSERTION    . ROTATOR CUFF REPAIR     bilat  . TONSILLECTOMY      Current Outpatient Medications  Medication Sig Dispense Refill  . abiraterone Acetate (ZYTIGA) 250 MG tablet Take 4 tablets (1,000 mg total) by mouth daily. Take on an empty stomach 1 hour before or 2 hours after a meal 120 tablet 1  . acetaminophen (TYLENOL) 500 MG  tablet Take 1,000 mg by mouth every 6 (six) hours as needed for mild pain, moderate pain or headache.     . Chlorpheniramine-Acetaminophen (CORICIDIN HBP COLD/FLU PO) Take 2 tablets by mouth 2 (two) times daily.    . citalopram (CELEXA) 20 MG tablet Take 20 mg by mouth daily.     . clonazePAM (KLONOPIN) 0.25 MG disintegrating tablet Take 1 tablet (0.25 mg total) by mouth 2 (two) times daily. 60 tablet 5  . fluticasone (FLONASE) 50 MCG/ACT nasal spray Place 1 spray into both nostrils daily as needed for allergies or rhinitis.     . hydrocortisone 2.5 % ointment Apply 1 application topically 2 (two) times daily as needed. Applied to affected area(s) of skin( pressure sores)    . isosorbide mononitrate (IMDUR) 30 MG 24 hr tablet Take 1 tablet (30 mg total) by mouth at bedtime. 90 tablet 2  . Melatonin 3 MG TABS Take 3 mg by mouth at bedtime.    . metoprolol succinate (TOPROL-XL) 50 MG 24 hr tablet TAKE ONE TABLET BY MOUTH ONCE DAILY WITH MEAL OR IMMEDIATELY FOLLOWING (Patient taking differently: half tablet PO daily) 90 tablet 3  . Multiple Vitamins-Minerals (ICAPS AREDS 2 PO) Take 2 tablets by mouth every morning.    . nitroGLYCERIN (NITROLINGUAL) 0.4 MG/SPRAY spray Place 1 spray under the tongue every 5 (five) minutes x 3 doses as needed for chest pain. 12 g 3  . ondansetron (ZOFRAN) 4 MG tablet Take 1 tablet (4 mg total) by mouth every 8 (eight) hours as needed for nausea or vomiting. 20 tablet 0  . Polyethyl Glycol-Propyl Glycol (SYSTANE) 0.4-0.3 % SOLN Place 1 drop into both eyes at bedtime.     . polyethylene glycol (MIRALAX / GLYCOLAX) packet Take 17 g by mouth daily as needed.    . predniSONE (DELTASONE) 5 MG tablet TAKE 1 TABLET BY MOUTH ONCE DAILY 90 tablet 0  . torsemide (DEMADEX) 20 MG tablet Take 20 mg by mouth daily.     . traMADol (ULTRAM) 50 MG tablet Take 1 tablet (50 mg total) by mouth every 6 (six) hours as needed. 30 tablet 0  . triamcinolone ointment (KENALOG) 0.1 % Apply 1  application topically See admin instructions. Use once to twice daily applied to affected area(s) of skin.    Marland Kitchen vitamin B-12 (CYANOCOBALAMIN) 1000 MCG tablet Take 1,000 mcg by mouth at bedtime.     Marland Kitchen warfarin (COUMADIN) 5 MG tablet Take 1 tablet (5 mg total) by mouth daily at 6 PM. Except every MWF take half tablet (2.5 mg) 30 tablet 1   No current facility-administered medications for this visit.     Allergies  Allergen Reactions  . Penicillins Hives, Shortness Of Breath and Other (See Comments)    Has patient had a  PCN reaction causing immediate rash, facial/tongue/throat swelling, SOB or lightheadedness with hypotension: Yes Has patient had a PCN reaction causing severe rash involving mucus membranes or skin necrosis: No Has patient had a PCN reaction that required hospitalization No Has patient had a PCN reaction occurring within the last 10 years: No If all of the above answers are "NO", then may proceed with Cephalosporin use.   . Simvastatin Other (See Comments)    Reaction:  Muscle weakness    Review of Systems negative except from HPI and PMH  Physical Exam HR 75 BP recorded but not now available and was normal   Well developed and nourished in no acute distress HENT normal Neck supple with JVP-flat Device pocket well healed; without hematoma or erythema.  There is no tethering  Clear Regular rate and rhythm, no murmurs or gallops Abd-soft with active BS No Clubbing cyanosis edema Skin-warm and dry A & Oriented  Grossly normal sensory and motor function sitting in wheel chair  ECG Apacing 75   Assessment and Plan  Sinus node dysfunction  Atrial pacer dependence   Atrial fibrillation   Coronary artery disease  Hypertension   Hx of PE and DVT on coumadin   Pacemaker-[employer]  He has reached ERI   Has best as I can tell this would be 3rd device  We have reviewed the benefits and risks of generator replacement.  These include but are not limited  to lead fracture and infection.  The patient understands, agrees and is willing to proceed.   Will use tyrex pouch  Without symptoms of ischemia    Will hold coumadin d-2 ONLY prior to procedure   t

## 2017-06-21 NOTE — H&P (View-Only) (Signed)
Patient Care Team: Lajean Manes, MD as PCP - General (Internal Medicine) Ladell Pier, MD as Attending Physician (Hematology and Oncology) Myrlene Broker, MD as Attending Physician (Urology)   HPI  Edward Woodward is a 83 y.o. male Seen to establish pacemaker followup needed number of years ago for sinus node dysfunction and presyncope. NO interval syncope  He also has a history of coronary artery disease with mild left ventricular dysfunction with EF of 50% 2011.  He has no history of atrial fibrillation x as  documented time of his pneumothorax that was a consequence of a fall 12/14.  Hx of PEmboli; he has been off coumadin in the past without untowards  He is enduring his cancer and chemo  Ambutlates with difficulty  Chronic sob and fatigue     Past Medical History:  Diagnosis Date  . AMD (age-related macular degeneration), bilateral   . Anemia   . Anginal pain ([place])    pts wife states pt had to use nitroglycerin this am 04/09/2016  . CAD (coronary artery disease)     NYHA Class 1B, CABG 1985  . CHF (congestive heart failure) ([place])   . Collagen vascular disease ([place])   . Collapsed lung    right   . Complication of anesthesia    pts wife states pt gets confused   . Degeneration of lumbar or lumbosacral intervertebral disc   . Depressive disorder, not elsewhere classified   . Dysrhythmia   . GERD (gastroesophageal reflux disease)   . History of blood transfusion   . History of hiatal hernia   . HTN (hypertension)   . Hypercholesteremia   . Lower leg edema    bilat  . Multiple falls   . Myocardial infarction ([place])   . Neuromuscular disorder (HCC)    neuropathy  . Neuropathy   . Non Hodgkin's lymphoma ([place])   . OSA on CPAP   . PE (pulmonary embolism) 03/2015  . Presence of permanent cardiac pacemaker   . Prostate cancer ([place])   . Shortness of breath dyspnea    with exertion   . Sinoatrial node dysfunction (HCC)   . Ulcers of both lower  legs (HCC)    history of   . Urinary incontinence     Past Surgical History:  Procedure Laterality Date  . APPENDECTOMY    . BACK SURGERY    . CARDIAC CATHETERIZATION N/A 08/27/2015   Procedure: Left Heart Cath and Cors/Grafts Angiography;  Surgeon: Belva Crome, MD;  Location: [facility] CV LAB;  Service: Cardiovascular;  Laterality: N/A;  . COLONOSCOPY N/A 08/26/2015   Procedure: COLONOSCOPY;  Surgeon: Clarene Essex, MD;  Location: [facility] ENDOSCOPY;  Service: Endoscopy;  Laterality: N/A;  . CORONARY ANGIOPLASTY WITH STENT PLACEMENT     s/p bare metal stent implant SVG-diagonal 11/23/05, s/p BM stent implantation SVG diagonal 10/22/06 (feeds LAD), and 05/2010 with DES to left circumflex  . [place]   SVG to Diagonal/LAD,  seqSVG to OM1 and OM2  . CYSTOSCOPY W/ URETERAL STENT PLACEMENT Bilateral 04/10/2016   Procedure: CYSTOSCOPY WITH RETROGRADE PYELOGRAM/URETERAL STENT PLACEMENT;  Surgeon: Festus Aloe, MD;  Location: [facility] ORS;  Service: Urology;  Laterality: Bilateral;  . CYSTOSCOPY W/ URETERAL STENT PLACEMENT Bilateral 10/09/2016   Procedure: CYSTOSCOPY WITH BILATERAL RETROGRADE PYELOGRAM [name]  URETERAL STENT REMOVAL;  Surgeon: Festus Aloe, MD;  Location: [facility] ORS;  Service: Urology;  Laterality: Bilateral;  .  ENDARTERECTOMY     bilat   . ESOPHAGOGASTRODUODENOSCOPY (EGD) WITH PROPOFOL N/A 08/26/2015   Procedure: ESOPHAGOGASTRODUODENOSCOPY (EGD) WITH PROPOFOL;  Surgeon: Clarene Essex, MD;  Location: [facility] ENDOSCOPY;  Service: Endoscopy;  Laterality: N/A;  . EXPLORATORY LAPAROTOMY    . LUMBAR FUSION    . PACEMAKER INSERTION    . ROTATOR CUFF REPAIR     bilat  . TONSILLECTOMY      Current Outpatient Medications  Medication Sig Dispense Refill  . abiraterone Acetate (ZYTIGA) 250 MG tablet Take 4 tablets (1,000 mg total) by mouth daily. Take on an empty stomach 1 hour before or 2 hours after a meal 120 tablet 1  . acetaminophen (TYLENOL) 500 MG  tablet Take 1,000 mg by mouth every 6 (six) hours as needed for mild pain, moderate pain or headache.     . Chlorpheniramine-Acetaminophen (CORICIDIN HBP COLD/FLU PO) Take 2 tablets by mouth 2 (two) times daily.    . citalopram (CELEXA) 20 MG tablet Take 20 mg by mouth daily.     . clonazePAM (KLONOPIN) 0.25 MG disintegrating tablet Take 1 tablet (0.25 mg total) by mouth 2 (two) times daily. 60 tablet 5  . fluticasone (FLONASE) 50 MCG/ACT nasal spray Place 1 spray into both nostrils daily as needed for allergies or rhinitis.     . hydrocortisone 2.5 % ointment Apply 1 application topically 2 (two) times daily as needed. Applied to affected area(s) of skin( pressure sores)    . isosorbide mononitrate (IMDUR) 30 MG 24 hr tablet Take 1 tablet (30 mg total) by mouth at bedtime. 90 tablet 2  . Melatonin 3 MG TABS Take 3 mg by mouth at bedtime.    . metoprolol succinate (TOPROL-XL) 50 MG 24 hr tablet TAKE ONE TABLET BY MOUTH ONCE DAILY WITH MEAL OR IMMEDIATELY FOLLOWING (Patient taking differently: half tablet PO daily) 90 tablet 3  . Multiple Vitamins-Minerals (ICAPS AREDS 2 PO) Take 2 tablets by mouth every morning.    . nitroGLYCERIN (NITROLINGUAL) 0.4 MG/SPRAY spray Place 1 spray under the tongue every 5 (five) minutes x 3 doses as needed for chest pain. 12 g 3  . ondansetron (ZOFRAN) 4 MG tablet Take 1 tablet (4 mg total) by mouth every 8 (eight) hours as needed for nausea or vomiting. 20 tablet 0  . Polyethyl Glycol-Propyl Glycol (SYSTANE) 0.4-0.3 % SOLN Place 1 drop into both eyes at bedtime.     . polyethylene glycol (MIRALAX / GLYCOLAX) packet Take 17 g by mouth daily as needed.    . predniSONE (DELTASONE) 5 MG tablet TAKE 1 TABLET BY MOUTH ONCE DAILY 90 tablet 0  . torsemide (DEMADEX) 20 MG tablet Take 20 mg by mouth daily.     . traMADol (ULTRAM) 50 MG tablet Take 1 tablet (50 mg total) by mouth every 6 (six) hours as needed. 30 tablet 0  . triamcinolone ointment (KENALOG) 0.1 % Apply 1  application topically See admin instructions. Use once to twice daily applied to affected area(s) of skin.    Marland Kitchen vitamin B-12 (CYANOCOBALAMIN) 1000 MCG tablet Take 1,000 mcg by mouth at bedtime.     Marland Kitchen warfarin (COUMADIN) 5 MG tablet Take 1 tablet (5 mg total) by mouth daily at 6 PM. Except every MWF take half tablet (2.5 mg) 30 tablet 1   No current facility-administered medications for this visit.     Allergies  Allergen Reactions  . Penicillins Hives, Shortness Of Breath and Other (See Comments)    Has patient had a  PCN reaction causing immediate rash, facial/tongue/throat swelling, SOB or lightheadedness with hypotension: Yes Has patient had a PCN reaction causing severe rash involving mucus membranes or skin necrosis: No Has patient had a PCN reaction that required hospitalization No Has patient had a PCN reaction occurring within the last 10 years: No If all of the above answers are "NO", then may proceed with Cephalosporin use.   . Simvastatin Other (See Comments)    Reaction:  Muscle weakness    Review of Systems negative except from HPI and PMH  Physical Exam HR 75 BP recorded but not now available and was normal   Well developed and nourished in no acute distress HENT normal Neck supple with JVP-flat Device pocket well healed; without hematoma or erythema.  There is no tethering  Clear Regular rate and rhythm, no murmurs or gallops Abd-soft with active BS No Clubbing cyanosis edema Skin-warm and dry A & Oriented  Grossly normal sensory and motor function sitting in wheel chair  ECG Apacing 75   Assessment and Plan  Sinus node dysfunction  Atrial pacer dependence   Atrial fibrillation   Coronary artery disease  Hypertension   Hx of PE and DVT on coumadin   Pacemaker-[employer]  He has reached ERI   Has best as I can tell this would be 3rd device  We have reviewed the benefits and risks of generator replacement.  These include but are not limited  to lead fracture and infection.  The patient understands, agrees and is willing to proceed.   Will use tyrex pouch  Without symptoms of ischemia    Will hold coumadin d-2 ONLY prior to procedure   t

## 2017-06-22 ENCOUNTER — Telehealth: Payer: Self-pay | Admitting: Neurology

## 2017-06-22 ENCOUNTER — Encounter: Payer: Self-pay | Admitting: Neurology

## 2017-06-22 ENCOUNTER — Encounter: Payer: Self-pay | Admitting: Oncology

## 2017-06-22 NOTE — Telephone Encounter (Signed)
Pharmacy changed in chart 

## 2017-06-22 NOTE — Telephone Encounter (Signed)
Pt's spouse called and said they have a new pharmacy and [facility] [address] [facility] and would like a new prescription called in there today but did not leave the name of the prescription

## 2017-06-23 ENCOUNTER — Other Ambulatory Visit: Payer: Self-pay | Admitting: *Deleted

## 2017-06-23 MED ORDER — CLONAZEPAM 0.25 MG PO TBDP: 0.2500 mg | ORAL_TABLET | Freq: Two times a day (BID) | ORAL | 5 refills | Status: DC

## 2017-06-24 ENCOUNTER — Telehealth: Payer: Self-pay | Admitting: Internal Medicine

## 2017-06-24 ENCOUNTER — Other Ambulatory Visit: Payer: Self-pay | Admitting: *Deleted

## 2017-06-24 DIAGNOSIS — I480 Paroxysmal atrial fibrillation: Secondary | ICD-10-CM

## 2017-06-24 DIAGNOSIS — Z45018 Encounter for adjustment and management of other part of cardiac pacemaker: Secondary | ICD-10-CM

## 2017-06-24 MED ORDER — TORSEMIDE 20 MG PO TABS: 20.0000 mg | ORAL_TABLET | Freq: Every day | ORAL | 3 refills | Status: DC

## 2017-06-24 NOTE — Telephone Encounter (Signed)
New Message      *STAT* If patient is at the pharmacy, call can be transferred to refill team.   1. Which medications need to be refilled? (please list name of each medication and dose if known)   torsemide (DEMADEX) 20 MG tablet Take 20 mg by mouth daily.      2. Which pharmacy/location (including street and city if local pharmacy) is medication to be sent to? [facility] [address] [place]  3. Do they need a 30 day or 90 day supply?  90 needs new prescription sent

## 2017-06-24 NOTE — Telephone Encounter (Signed)
Pt's medication was sent to pt's pharmacy as requested. Confirmation received.  °

## 2017-06-24 NOTE — Progress Notes (Signed)
Scheduled patient for generator change for his PPM which is at [facility].  Dec 14 at 9:30 am, arrive at 7:30 am.  Labs will be drawn on 07/06/17.  Per OV note patient is to hold coumadin 2 days prior to procedure. Lab orders placed, appointment made.  Reviewed this information with patient's wife [name]). (DPR) They are aware to pick up instruction letter at front desk when pt comes for lab work.

## 2017-06-25 ENCOUNTER — Other Ambulatory Visit: Payer: Self-pay | Admitting: *Deleted

## 2017-06-25 DIAGNOSIS — C61 Malignant neoplasm of prostate: Secondary | ICD-10-CM

## 2017-06-25 MED ORDER — PREDNISONE 5 MG PO TABS: 5.0000 mg | ORAL_TABLET | Freq: Every day | ORAL | 0 refills | Status: DC

## 2017-06-28 ENCOUNTER — Telehealth: Payer: Self-pay | Admitting: Oncology

## 2017-06-28 NOTE — Telephone Encounter (Signed)
Scheduled appt per 11/29 sch message - patient is aware of appt date and time and reminder letter sent in the mail.

## 2017-07-02 ENCOUNTER — Encounter: Payer: Self-pay | Admitting: Neurology

## 2017-07-02 ENCOUNTER — Ambulatory Visit (INDEPENDENT_AMBULATORY_CARE_PROVIDER_SITE_OTHER): Payer: Medicare Other | Admitting: Neurology

## 2017-07-02 VITALS — BP 110/68 | HR 64 | Ht 70.0 in | Wt 183.0 lb

## 2017-07-02 DIAGNOSIS — F0390 Unspecified dementia without behavioral disturbance: Secondary | ICD-10-CM

## 2017-07-02 DIAGNOSIS — I25810 Atherosclerosis of coronary artery bypass graft(s) without angina pectoris: Secondary | ICD-10-CM | POA: Diagnosis not present

## 2017-07-02 NOTE — Progress Notes (Signed)
Follow-up Visit   Date: 07/02/17    MATAS BURROWS MRN: [id number] DOB: 1927-02-26   Interim History: Edward Woodward is a 83 y.o. right-handed Caucasian male with prostate cancer, non-Hodgkin's lymphoma on chemotherapy, depression, CAD status post CABG, atrial fibrillation on Coumadin, sinus node dysfunction s/p PPM, history of DVT and PE, and lumbar surgery for stenosis returning to the clinic for follow-up of myoclonic jerks and dementia.  The patient was accompanied to the clinic by wife who also provides collateral information.    History of present illness: Patient has noticed a steady decline in function over the past year.  He was previously walking with a rollator and able to bath himself, but since early 2016, he has started to need assistance from his wife.  In mid-June, patient condition progressed where he was unable to walk, began falling more, hallucinating, limbs jerking, and felt increasingly weak.  He was admitted to [facility] on 6/18 -6/23 for confusion, arm shaking, and poor PO intake. Wife states that he had a lot of jerking of his arms and trembling of the leg.  Mental status change was secondary to toxic-metabolic in setting of electrolyte derangements (hypokalemia) and acute kidney injury.  CT of the abdomen was remarkable for interval progression of a retroperitoneal/periaortic mass with encasement of the aorta suggestive of retroperitoneal fibrosis, adenopathy, or lymphoma. Also noted on CT abdomen is bilateral hydronephrosis which has worsened since the prior study and is likely secondary to compression of the proximal ureters by the aforementioned mass. He was treated conservatively, repleted with electrotyles, and given IV hydration.  He eventually showed improvement and was discharged back to SNF, where he slowly noticed slight improvement of the abnormal movements. He has been at home since July 12th. He continues to have jerking movements of the hands, but  overall significantly improved from before.  He currently uses a walker and wheelchair.  He was previously using a rollator, but was told at rehab, he was moving too fast with it.  Since returning home, his confusion has improved.  He is able to feed himself.  His wife assists him for showers.  Over the last year, he has become much more forgetful.   He has previously been evaluated at [facility] in 2006 at which time he was diagnosed by EMG with multiple chronic severe radiculopathies with evidence of ongoing denervation in the bilateral gastrocnemius muscles and superimposed mild axonal polyneuropathy. There was concern for hardware slippage from his prior lumbar surgery and he was evaluated by his neurosurgeon, Dr. Ellene Route in [place], and was told he had 2 screws that were loose. He elected not to undergo further surgery at that time. More recently, he was evaluated in 2013 for falls and imbalance and diagnosed with idiopathic neuropathy and due to clinical symptoms suggestive of Parkinson's disease, also offered a trail of sinemet 25/100mg  TID.  Patient and his wife do not recall if he started sinemet or whether the diagnosis of PD was discussed.   UPDATE 06/26/2016:  He was diagnosed with lymphoma since he was last here and after undergoing chemotherapy, his wife feels that his mentation is improved, saying that they even had a conversation last week.  He remains forgetful about people and events.  He does not have delusional thoughts like before.  He noticed marked improvement of his myoclonic jerks since being on clonazepam 0.25mg  bid and now only has intermittent hand tremors.  He is able to feed himself and  drink coffee without making a mess.  He is getting home PT to help improve his gait and currently uses a walker and wheelchair.   UPDATE 12/25/2016:   Wife reduced his clonazepam to 0.125mg  and 0.25 at bedtime due to sedation and there has been no new abnormal movements.  His  clonazepam at bedtime helps with his leg cramps and pain at night.  His wife states that they are able to have conversations and he is able to bathe, dress, and feed himself.  He enjoys watching TV and plays a card game online. He walks with a walker and takes breaks as needed. 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  He has not suffered any falls.   UPDATE 07/02/2017:   He is here for 6 month appointment.  He suffered a fall in October and ER visit because of congestion and treated with antibiotics.  During this illness, he became increasingly confused and cannot recognize his wife or his surroundings.  Since this time, he has spells of confusion which can last about an hours several times per week. There is no associated agitation or aggression.  Wife is concerned because he gets worried about his surroundings. CT head from 10/28 did not show any acute stroke or bleed.  He has moderate atrophy.  His wife was unable to taper his clonazepam because it made his tremors and jerks worse.  He is able to bathe and feed himself and requires some assistance with dressing.  He uses a walker at home and wheelchair for long distances.  Medications:  Current Outpatient Medications on File Prior to Visit  Medication Sig Dispense Refill  . abiraterone Acetate (ZYTIGA) 250 MG tablet Take 4 tablets (1,000 mg total) by mouth daily. Take on an empty stomach 1 hour before or 2 hours after a meal 120 tablet 1  . acetaminophen (TYLENOL) 500 MG tablet Take 1,000 mg by mouth every 6 (six) hours as needed for mild pain, moderate pain or headache.     . Chlorpheniramine-Acetaminophen (CORICIDIN HBP COLD/FLU PO) Take 2 tablets by mouth 2 (two) times daily.    . citalopram (CELEXA) 20 MG tablet Take 20 mg by mouth daily.     . clonazePAM (KLONOPIN) 0.25 MG disintegrating tablet Take 1 tablet (0.25 mg total) by mouth 2 (two) times daily. 60 tablet  5  . fluticasone (FLONASE) 50 MCG/ACT nasal spray Place 1 spray into both nostrils daily as needed for allergies or rhinitis.     . hydrocortisone 2.5 % ointment Apply 1 application topically 2 (two) times daily as needed. Applied to affected area(s) of skin( pressure sores)    . isosorbide mononitrate (IMDUR) 30 MG 24 hr tablet Take 1 tablet (30 mg total) by mouth at bedtime. 90 tablet 2  . Melatonin 3 MG TABS Take 3 mg by mouth at bedtime.    . metoprolol succinate (TOPROL-XL) 50 MG 24 hr tablet TAKE ONE TABLET BY MOUTH ONCE DAILY WITH MEAL OR IMMEDIATELY FOLLOWING (Patient taking differently: half tablet PO daily) 90 tablet 3  . Multiple Vitamins-Minerals (ICAPS AREDS 2 PO) Take 2 tablets by mouth every morning.    . nitroGLYCERIN (NITROLINGUAL) 0.4 MG/SPRAY spray Place 1 spray under the tongue every 5 (five) minutes x 3 doses as needed for chest pain. 12 g 3  . ondansetron (ZOFRAN) 4 MG tablet Take 1 tablet (4 mg total) by mouth every 8 (eight) hours  as needed for nausea or vomiting. 20 tablet 0  . Polyethyl Glycol-Propyl Glycol (SYSTANE) 0.4-0.3 % SOLN Place 1 drop into both eyes at bedtime.     . polyethylene glycol (MIRALAX / GLYCOLAX) packet Take 17 g by mouth daily as needed.    . predniSONE (DELTASONE) 5 MG tablet Take 1 tablet (5 mg total) by mouth daily. 90 tablet 0  . torsemide (DEMADEX) 20 MG tablet Take 1 tablet (20 mg total) by mouth daily. 90 tablet 3  . traMADol (ULTRAM) 50 MG tablet Take 1 tablet (50 mg total) by mouth every 6 (six) hours as needed. 30 tablet 0  . triamcinolone ointment (KENALOG) 0.1 % Apply 1 application topically See admin instructions. Use once to twice daily applied to affected area(s) of skin.    Marland Kitchen vitamin B-12 (CYANOCOBALAMIN) 1000 MCG tablet Take 1,000 mcg by mouth at bedtime.     Marland Kitchen warfarin (COUMADIN) 5 MG tablet Take 1 tablet (5 mg total) by mouth daily at 6 PM. Except every MWF take half tablet (2.5 mg) 30 tablet 1   No current facility-administered  medications on file prior to visit.     Allergies:  Allergies  Allergen Reactions  . Penicillins Hives, Shortness Of Breath and Other (See Comments)    Has patient had a PCN reaction causing immediate rash, facial/tongue/throat swelling, SOB or lightheadedness with hypotension: Yes Has patient had a PCN reaction causing severe rash involving mucus membranes or skin necrosis: No Has patient had a PCN reaction that required hospitalization No Has patient had a PCN reaction occurring within the last 10 years: No If all of the above answers are "NO", then may proceed with Cephalosporin use.   . Simvastatin Other (See Comments)    Reaction:  Muscle weakness    Review of Systems:  CONSTITUTIONAL: No fevers, chills, night sweats, or weight loss.  EYES: No visual changes or eye pain ENT: No hearing changes.  No history of nose bleeds.   RESPIRATORY: No cough, wheezing and shortness of breath.   CARDIOVASCULAR: Negative for chest pain, and palpitations.   GI: Negative for abdominal discomfort, blood in stools or black stools.  No recent change in bowel habits.   GU:  No history of incontinence.   MUSCLOSKELETAL: +history of joint pain or swelling.  No myalgias.   SKIN: Negative for lesions, rash, and itching.   ENDOCRINE: Negative for cold or heat intolerance, polydipsia or goiter.   PSYCH:  No depression or anxiety symptoms.   NEURO: As Above.   Vital Signs:  BP 110/68   Pulse 64   Ht 5\' 10"  (1.778 m)   Wt 183 lb (83 kg)   SpO2 98%   BMI 26.26 kg/m   Neurological Exam: MENTAL STATUS including orientation to time, place, person, recent and remote memory, attention span and concentration, language, and fund of knowledge is fair.  He is oriented to person, year, place, and correctly identifies his DOB.  President - trump.  Affect is blunted, but he smiles appropriately.  CRANIAL NERVES:   Face is symmetric.   MOTOR:  Motor strength is 5/5 in all extremities, including hip flexors.   No tremors or rigidity.  COORDINATION/GAIT:  Finger tapping and toe tapping is normal for age.  He is in wheelchair today, gait is not tested   Data: CT head 01/12/2016: No acute intracranial hemorrhage. Age-related atrophy and chronic microvascular ischemic disease.   IMPRESSION/PLAN: 1.  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  Clinically,  doing much better.  Recommend reducing clonazepam to 0.25mg  twice daily.  Previously, I had considered possibility of Parkinson's disease, however on exam today, there is no tremor, bradykinesia, or rigidity.  He has a blunted affected and stooped, slow gait, and I will continue to follow him to see how symptoms evolve but do not see any indication to start levadopa at this time.   2.  Dementia without behavior disturbance.  He is very sensitive by any infection or stressor which causes acute delirium on top of his dementia.  At this time, he does not have any aggression or agitation, but if behavior changes get worse, start Abilify.  Counseled on non-pharmacologic therapies for confusion/agitation.  His wife is primary caregiver and I provided her with information for community senior care resources and caregiver support.   Return to clinic in 6 months, or sooner as needed  Greater than 50% of this 30 minute visit was spent in counseling, explanation of diagnosis, planning of further management, and coordination of care.   Thank you for allowing me to participate in patient's care.  If I can answer any additional questions, I would be pleased to do so.    Sincerely,    Marcio Hoque K. Posey Pronto, DO

## 2017-07-02 NOTE — Patient Instructions (Signed)
[employer] [place] Tel [place]:  [phone] Tel [place]:   [phone] [email or web address]   Resources for common questions found under "Pathways & Protocols " [email or web address]/pathways/Pathways_Menu.htm  Return to clinic in 6 months  

## 2017-07-06 ENCOUNTER — Other Ambulatory Visit: Payer: Medicare Other

## 2017-07-06 ENCOUNTER — Telehealth: Payer: Self-pay | Admitting: Pharmacy Technician

## 2017-07-06 NOTE — Telephone Encounter (Signed)
Oral Oncology Patient Advocate Encounter  Received notification from [facility] and [place] Patient Assistance program that patient has been automatically re-enrolled into their program to continue to receive Zytiga from the manufacturer at $0 out of pocket until 07/26/2018.   I called and spoke with patient's wife.  Patient knows to call the office with any additional questions or concerns.  Oral Oncology Clinic will continue to follow.  Gilmore Laroche, CPhT, [place] Oral Oncology Patient Advocate [phone] 07/06/2017 12:06 PM

## 2017-07-07 ENCOUNTER — Other Ambulatory Visit: Payer: Self-pay | Admitting: *Deleted

## 2017-07-07 DIAGNOSIS — H353110 Nonexudative age-related macular degeneration, right eye, stage unspecified: Secondary | ICD-10-CM | POA: Diagnosis not present

## 2017-07-07 DIAGNOSIS — H353220 Exudative age-related macular degeneration, left eye, stage unspecified: Secondary | ICD-10-CM | POA: Diagnosis not present

## 2017-07-07 DIAGNOSIS — H3530 Unspecified macular degeneration: Secondary | ICD-10-CM | POA: Diagnosis not present

## 2017-07-07 MED ORDER — WARFARIN SODIUM 5 MG PO TABS: 5.0000 mg | ORAL_TABLET | Freq: Every day | ORAL | 1 refills | Status: DC

## 2017-07-08 ENCOUNTER — Other Ambulatory Visit: Payer: Medicare Other | Admitting: *Deleted

## 2017-07-08 DIAGNOSIS — I480 Paroxysmal atrial fibrillation: Secondary | ICD-10-CM | POA: Diagnosis not present

## 2017-07-08 DIAGNOSIS — Z45018 Encounter for adjustment and management of other part of cardiac pacemaker: Secondary | ICD-10-CM | POA: Diagnosis not present

## 2017-07-08 LAB — CBC
Hematocrit: 35 % — ABNORMAL LOW (ref 37.5–51.0)
Hemoglobin: 11.5 g/dL — ABNORMAL LOW (ref 13.0–17.7)
MCH: 28.8 pg (ref 26.6–33.0)
MCHC: 32.9 g/dL (ref 31.5–35.7)
MCV: 88 fL (ref 79–97)
PLATELETS: 166 10*3/uL (ref 150–379)
RBC: 3.99 x10E6/uL — ABNORMAL LOW (ref 4.14–5.80)
RDW: 15.4 % (ref 12.3–15.4)
WBC: 5.7 10*3/uL (ref 3.4–10.8)

## 2017-07-08 LAB — BASIC METABOLIC PANEL
BUN / CREAT RATIO: 15 (ref 10–24)
BUN: 22 mg/dL (ref 8–27)
CO2: 28 mmol/L (ref 20–29)
CREATININE: 1.47 mg/dL — AB (ref 0.76–1.27)
Calcium: 10.2 mg/dL (ref 8.6–10.2)
Chloride: 104 mmol/L (ref 96–106)
GFR calc non Af Amer: 42 mL/min/{1.73_m2} — ABNORMAL LOW (ref >59)
GFR, EST AFRICAN AMERICAN: 49 mL/min/{1.73_m2} — AB (ref >59)
GLUCOSE: 113 mg/dL — AB (ref 65–99)
Potassium: 4.3 mmol/L (ref 3.5–5.2)
SODIUM: 140 mmol/L (ref 134–144)

## 2017-07-08 LAB — PROTIME-INR
INR: 2.7 — AB (ref 0.8–1.2)
Prothrombin Time: 29 s — ABNORMAL HIGH (ref 9.1–12.0)

## 2017-07-09 ENCOUNTER — Encounter (HOSPITAL_COMMUNITY): Payer: Self-pay | Admitting: Internal Medicine

## 2017-07-09 ENCOUNTER — Encounter (HOSPITAL_COMMUNITY)
Admission: RE | Disposition: A | Discharge status: Home / Self Care | Payer: Self-pay | Source: Ambulatory Visit | Attending: Internal Medicine

## 2017-07-09 ENCOUNTER — Ambulatory Visit (HOSPITAL_COMMUNITY)
Admission: RE | Admit: 2017-07-09 | Discharge: 2017-07-09 | Disposition: A | Discharge status: Home / Self Care | Payer: Medicare Other | Source: Ambulatory Visit | Attending: Internal Medicine | Admitting: Internal Medicine

## 2017-07-09 DIAGNOSIS — Z7901 Long term (current) use of anticoagulants: Secondary | ICD-10-CM | POA: Diagnosis not present

## 2017-07-09 DIAGNOSIS — I2782 Chronic pulmonary embolism: Secondary | ICD-10-CM | POA: Insufficient documentation

## 2017-07-09 DIAGNOSIS — I495 Sick sinus syndrome: Secondary | ICD-10-CM | POA: Diagnosis not present

## 2017-07-09 DIAGNOSIS — I252 Old myocardial infarction: Secondary | ICD-10-CM | POA: Diagnosis not present

## 2017-07-09 DIAGNOSIS — I509 Heart failure, unspecified: Secondary | ICD-10-CM | POA: Insufficient documentation

## 2017-07-09 DIAGNOSIS — H3530 Unspecified macular degeneration: Secondary | ICD-10-CM | POA: Insufficient documentation

## 2017-07-09 DIAGNOSIS — I110 Hypertensive heart disease with heart failure: Secondary | ICD-10-CM | POA: Insufficient documentation

## 2017-07-09 DIAGNOSIS — I82509 Chronic embolism and thrombosis of unspecified deep veins of unspecified lower extremity: Secondary | ICD-10-CM | POA: Insufficient documentation

## 2017-07-09 DIAGNOSIS — Z8572 Personal history of non-Hodgkin lymphomas: Secondary | ICD-10-CM | POA: Diagnosis not present

## 2017-07-09 DIAGNOSIS — Z79899 Other long term (current) drug therapy: Secondary | ICD-10-CM | POA: Diagnosis not present

## 2017-07-09 DIAGNOSIS — Z8546 Personal history of malignant neoplasm of prostate: Secondary | ICD-10-CM | POA: Diagnosis not present

## 2017-07-09 DIAGNOSIS — G4733 Obstructive sleep apnea (adult) (pediatric): Secondary | ICD-10-CM | POA: Diagnosis not present

## 2017-07-09 DIAGNOSIS — Z01818 Encounter for other preprocedural examination: Secondary | ICD-10-CM | POA: Diagnosis not present

## 2017-07-09 DIAGNOSIS — I2510 Atherosclerotic heart disease of native coronary artery without angina pectoris: Secondary | ICD-10-CM | POA: Diagnosis not present

## 2017-07-09 DIAGNOSIS — E7800 Pure hypercholesterolemia, unspecified: Secondary | ICD-10-CM | POA: Diagnosis not present

## 2017-07-09 DIAGNOSIS — I4891 Unspecified atrial fibrillation: Secondary | ICD-10-CM | POA: Diagnosis not present

## 2017-07-09 DIAGNOSIS — Z880 Allergy status to penicillin: Secondary | ICD-10-CM | POA: Diagnosis not present

## 2017-07-09 DIAGNOSIS — Z45010 Encounter for checking and testing of cardiac pacemaker pulse generator [battery]: Secondary | ICD-10-CM | POA: Diagnosis not present

## 2017-07-09 DIAGNOSIS — Z951 Presence of aortocoronary bypass graft: Secondary | ICD-10-CM | POA: Insufficient documentation

## 2017-07-09 HISTORY — PX: PPM GENERATOR CHANGEOUT: EP1233

## 2017-07-09 LAB — CUP PACEART INCLINIC DEVICE CHECK
Implantable Lead Implant Date: 20081013
Implantable Lead Location: 753859
Implantable Lead Model: 4457
Implantable Lead Model: 4470
Implantable Lead Serial Number: 205344
Implantable Pulse Generator Implant Date: 20081013
MDC IDC LEAD IMPLANT DT: 20081013
MDC IDC LEAD LOCATION: 753860
MDC IDC LEAD SERIAL: 210110
MDC IDC SESS DTM: 20181214150900
Pulse Gen Model: 1297
Pulse Gen Serial Number: 317986

## 2017-07-09 LAB — SURGICAL PCR SCREEN
MRSA, PCR: NEGATIVE
STAPHYLOCOCCUS AUREUS: NEGATIVE

## 2017-07-09 LAB — PROTIME-INR
INR: 2.63
Prothrombin Time: 27.9 seconds — ABNORMAL HIGH (ref 11.4–15.2)

## 2017-07-09 SURGERY — PPM GENERATOR CHANGEOUT

## 2017-07-09 MED ORDER — LIDOCAINE HCL (PF) 1 % IJ SOLN
INTRAMUSCULAR | Status: AC
  Filled 2017-07-09: qty 30

## 2017-07-09 MED ORDER — MUPIROCIN 2 % EX OINT
TOPICAL_OINTMENT | CUTANEOUS | Status: AC
  Administered 2017-07-09: 1 via TOPICAL
  Filled 2017-07-09: qty 22

## 2017-07-09 MED ORDER — ONDANSETRON HCL 4 MG/2ML IJ SOLN: 4.0000 mg | Freq: Four times a day (QID) | INTRAMUSCULAR | Status: DC | PRN

## 2017-07-09 MED ORDER — CHLORHEXIDINE GLUCONATE 4 % EX LIQD: 60.0000 mL | Freq: Once | CUTANEOUS | Status: DC

## 2017-07-09 MED ORDER — MUPIROCIN 2 % EX OINT
1.0000 "application " | TOPICAL_OINTMENT | Freq: Once | CUTANEOUS | Status: AC
  Administered 2017-07-09: 1 via TOPICAL

## 2017-07-09 MED ORDER — SODIUM CHLORIDE 0.9 % IV SOLN: INTRAVENOUS | Status: AC

## 2017-07-09 MED ORDER — FENTANYL CITRATE (PF) 100 MCG/2ML IJ SOLN
INTRAMUSCULAR | Status: AC
  Filled 2017-07-09: qty 2

## 2017-07-09 MED ORDER — VANCOMYCIN HCL IN DEXTROSE 1-5 GM/200ML-% IV SOLN
1000.0000 mg | INTRAVENOUS | Status: AC
  Administered 2017-07-09: 1000 mg via INTRAVENOUS

## 2017-07-09 MED ORDER — LIDOCAINE HCL (PF) 1 % IJ SOLN
INTRAMUSCULAR | Status: DC | PRN
  Administered 2017-07-09: 45 mL

## 2017-07-09 MED ORDER — SODIUM CHLORIDE 0.9 % IR SOLN
Status: AC
  Filled 2017-07-09: qty 2

## 2017-07-09 MED ORDER — ACETAMINOPHEN 325 MG PO TABS: 325.0000 mg | ORAL_TABLET | ORAL | Status: DC | PRN

## 2017-07-09 MED ORDER — MIDAZOLAM HCL 5 MG/5ML IJ SOLN
INTRAMUSCULAR | Status: AC
  Filled 2017-07-09: qty 5

## 2017-07-09 MED ORDER — SODIUM CHLORIDE 0.9 % IR SOLN
80.0000 mg | Status: AC
  Administered 2017-07-09: 80 mg

## 2017-07-09 MED ORDER — SODIUM CHLORIDE 0.9 % IV SOLN
INTRAVENOUS | Status: DC
  Administered 2017-07-09: 09:00:00 via INTRAVENOUS

## 2017-07-09 MED ORDER — VANCOMYCIN HCL IN DEXTROSE 1-5 GM/200ML-% IV SOLN
INTRAVENOUS | Status: AC
  Filled 2017-07-09: qty 200

## 2017-07-09 SURGICAL SUPPLY — 7 items
CABLE SURGICAL S-101-97-12 (CABLE) ×2 IMPLANT
HEMOSTAT SURGICEL 2X4 FIBR (HEMOSTASIS) ×2 IMPLANT
PACEMAKER ACCOLADE GR (Pacemaker) ×2 IMPLANT
PAD DEFIB LIFELINK (PAD) ×2 IMPLANT
POUCH AIGIS-R ANTIBACT PPM (Mesh General) ×3 IMPLANT
POUCH AIGIS-R ANTIBACT PPM MED (Mesh General) IMPLANT
TRAY PACEMAKER INSERTION (PACKS) ×2 IMPLANT

## 2017-07-09 NOTE — Interval H&P Note (Signed)
History and Physical Interval Note:  07/09/2017 9:05 AM  Edward Woodward  has presented today for surgery, with the diagnosis of ERI  The various methods of treatment have been discussed with the patient and family. After consideration of risks, benefits and other options for treatment, the patient has consented to  Procedure(s): PPM GENERATOR CHANGEOUT (N/A) as a surgical intervention .  The patient's history has been reviewed, patient examined, no change in status, stable for surgery.  I have reviewed the patient's chart and labs.  Questions were answered to the patient's satisfaction.    No significant changes   Virl Axe

## 2017-07-09 NOTE — Discharge Instructions (Signed)

## 2017-07-12 MED FILL — Midazolam HCl Inj 5 MG/5ML (Base Equivalent): INTRAMUSCULAR | Qty: 5 | Status: AC

## 2017-07-12 MED FILL — Fentanyl Citrate Preservative Free (PF) Inj 100 MCG/2ML: INTRAMUSCULAR | Qty: 2 | Status: AC

## 2017-07-15 ENCOUNTER — Other Ambulatory Visit: Payer: Self-pay | Admitting: *Deleted

## 2017-07-15 MED ORDER — METOPROLOL SUCCINATE ER 50 MG PO TB24: ORAL_TABLET | ORAL | 3 refills | Status: DC

## 2017-07-15 NOTE — Telephone Encounter (Signed)
1/2 tab QD is correct.  Thanks!

## 2017-07-15 NOTE — Telephone Encounter (Signed)
Patients wife called and requested a refill on metoprolol for the patient. She reports that he takes 1/2 of a 50 mg tab qd. Just wanted to clarify that this is the correct dose as last refills were sent in for one tab qd. Please advise. Thanks, MI

## 2017-07-15 NOTE — Telephone Encounter (Signed)
Per last to OV 06/21/17 with Dr. Tamala Julian patient med list states pt is taking 0.5 tablet by mouth QD of metoprolol

## 2017-07-21 ENCOUNTER — Ambulatory Visit: Payer: Medicare Other

## 2017-07-23 ENCOUNTER — Ambulatory Visit (INDEPENDENT_AMBULATORY_CARE_PROVIDER_SITE_OTHER): Payer: Medicare Other | Admitting: *Deleted

## 2017-07-23 DIAGNOSIS — I495 Sick sinus syndrome: Secondary | ICD-10-CM

## 2017-07-23 LAB — CUP PACEART INCLINIC DEVICE CHECK
Implantable Lead Location: 753860
Implantable Lead Model: 4470
Implantable Lead Serial Number: 205344
Lead Channel Impedance Value: 566 Ohm
Lead Channel Pacing Threshold Pulse Width: 0.4 ms
Lead Channel Sensing Intrinsic Amplitude: 1.4 mV
Lead Channel Setting Pacing Amplitude: 2 V
Lead Channel Setting Pacing Amplitude: 3 V
Lead Channel Setting Pacing Pulse Width: 0.4 ms
MDC IDC LEAD IMPLANT DT: 20081013
MDC IDC LEAD IMPLANT DT: 20081013
MDC IDC LEAD LOCATION: 753859
MDC IDC LEAD SERIAL: 210110
MDC IDC MSMT LEADCHNL RA PACING THRESHOLD AMPLITUDE: 0.5 V
MDC IDC MSMT LEADCHNL RA PACING THRESHOLD PULSEWIDTH: 0.4 ms
MDC IDC MSMT LEADCHNL RV IMPEDANCE VALUE: 474 Ohm
MDC IDC MSMT LEADCHNL RV PACING THRESHOLD AMPLITUDE: 1.2 V
MDC IDC MSMT LEADCHNL RV SENSING INTR AMPL: 10.7 mV
MDC IDC PG IMPLANT DT: 20181214
MDC IDC SESS DTM: 20181228050000
MDC IDC SET LEADCHNL RV SENSING SENSITIVITY: 2.5 mV
Pulse Gen Serial Number: 377798

## 2017-07-23 NOTE — Progress Notes (Signed)
Wound check appointment s/p gen change. dermabond removed. Wound without redness or edema. Incision edges approximated, wound well healed. Normal device function. Thresholds, sensing, and impedances consistent with implant measurements. Device programmed at chronic outputs s/p gen change. Histogram distribution appropriate for patient and level of activity. No mode switches or high ventricular rates noted. Patient educated about wound care, arm mobility, lifting restrictions. ROV with SK 10/13/17

## 2017-07-28 ENCOUNTER — Ambulatory Visit: Payer: Medicare Other

## 2017-07-28 ENCOUNTER — Ambulatory Visit: Payer: Medicare Other | Admitting: Nurse Practitioner

## 2017-07-28 ENCOUNTER — Other Ambulatory Visit: Payer: Medicare Other

## 2017-08-03 ENCOUNTER — Other Ambulatory Visit: Payer: Self-pay

## 2017-08-03 DIAGNOSIS — C61 Malignant neoplasm of prostate: Secondary | ICD-10-CM

## 2017-08-03 MED ORDER — ABIRATERONE ACETATE 250 MG PO TABS: 1000.0000 mg | ORAL_TABLET | Freq: Every day | ORAL | 1 refills | Status: DC

## 2017-08-11 ENCOUNTER — Inpatient Hospital Stay: Payer: Medicare Other | Attending: Oncology

## 2017-08-11 ENCOUNTER — Encounter: Payer: Self-pay | Admitting: Nurse Practitioner

## 2017-08-11 ENCOUNTER — Ambulatory Visit (HOSPITAL_BASED_OUTPATIENT_CLINIC_OR_DEPARTMENT_OTHER): Payer: Medicare Other | Admitting: Medical

## 2017-08-11 ENCOUNTER — Telehealth: Payer: Self-pay | Admitting: Nurse Practitioner

## 2017-08-11 ENCOUNTER — Inpatient Hospital Stay (HOSPITAL_BASED_OUTPATIENT_CLINIC_OR_DEPARTMENT_OTHER): Payer: Medicare Other | Admitting: Nurse Practitioner

## 2017-08-11 ENCOUNTER — Inpatient Hospital Stay: Payer: Medicare Other

## 2017-08-11 VITALS — BP 130/98 | HR 65 | Temp 97.9°F | Resp 18

## 2017-08-11 VITALS — BP 162/65 | HR 64 | Temp 97.8°F | Resp 18

## 2017-08-11 DIAGNOSIS — D696 Thrombocytopenia, unspecified: Secondary | ICD-10-CM | POA: Insufficient documentation

## 2017-08-11 DIAGNOSIS — C8299 Follicular lymphoma, unspecified, extranodal and solid organ sites: Secondary | ICD-10-CM

## 2017-08-11 DIAGNOSIS — I2510 Atherosclerotic heart disease of native coronary artery without angina pectoris: Secondary | ICD-10-CM | POA: Insufficient documentation

## 2017-08-11 DIAGNOSIS — Z7901 Long term (current) use of anticoagulants: Secondary | ICD-10-CM | POA: Diagnosis not present

## 2017-08-11 DIAGNOSIS — I480 Paroxysmal atrial fibrillation: Secondary | ICD-10-CM

## 2017-08-11 DIAGNOSIS — D6481 Anemia due to antineoplastic chemotherapy: Secondary | ICD-10-CM | POA: Insufficient documentation

## 2017-08-11 DIAGNOSIS — Z955 Presence of coronary angioplasty implant and graft: Secondary | ICD-10-CM | POA: Diagnosis not present

## 2017-08-11 DIAGNOSIS — N1330 Unspecified hydronephrosis: Secondary | ICD-10-CM | POA: Diagnosis not present

## 2017-08-11 DIAGNOSIS — Z79899 Other long term (current) drug therapy: Secondary | ICD-10-CM

## 2017-08-11 DIAGNOSIS — C61 Malignant neoplasm of prostate: Secondary | ICD-10-CM

## 2017-08-11 DIAGNOSIS — Z5112 Encounter for antineoplastic immunotherapy: Secondary | ICD-10-CM | POA: Diagnosis not present

## 2017-08-11 DIAGNOSIS — Z86718 Personal history of other venous thrombosis and embolism: Secondary | ICD-10-CM | POA: Insufficient documentation

## 2017-08-11 DIAGNOSIS — H3530 Unspecified macular degeneration: Secondary | ICD-10-CM | POA: Insufficient documentation

## 2017-08-11 DIAGNOSIS — C8589 Other specified types of non-Hodgkin lymphoma, extranodal and solid organ sites: Secondary | ICD-10-CM | POA: Diagnosis not present

## 2017-08-11 DIAGNOSIS — T451X5A Adverse effect of antineoplastic and immunosuppressive drugs, initial encounter: Secondary | ICD-10-CM

## 2017-08-11 LAB — CBC WITH DIFFERENTIAL/PLATELET
BASOS ABS: 0.1 10*3/uL (ref 0.0–0.1)
Basophils Relative: 1 %
EOS PCT: 3 %
Eosinophils Absolute: 0.2 10*3/uL (ref 0.0–0.5)
HEMATOCRIT: 33.6 % — AB (ref 38.4–49.9)
Hemoglobin: 11.4 g/dL — ABNORMAL LOW (ref 13.0–17.1)
LYMPHS ABS: 1.1 10*3/uL (ref 0.9–3.3)
Lymphocytes Relative: 14 %
MCH: 30.1 pg (ref 27.2–33.4)
MCHC: 33.8 g/dL (ref 32.0–36.0)
MCV: 89.1 fL (ref 79.3–98.0)
MONO ABS: 0.7 10*3/uL (ref 0.1–0.9)
Monocytes Relative: 8 %
NEUTROS ABS: 5.9 10*3/uL (ref 1.5–6.5)
Neutrophils Relative %: 74 %
Platelets: 175 10*3/uL (ref 140–400)
RBC: 3.77 MIL/uL — AB (ref 4.20–5.82)
RDW: 15.5 % (ref 11.0–15.6)
WBC: 8 10*3/uL (ref 4.0–10.3)

## 2017-08-11 LAB — COMPREHENSIVE METABOLIC PANEL
ALBUMIN: 3.3 g/dL — AB (ref 3.5–5.0)
ALT: 7 U/L (ref 0–55)
ANION GAP: 9 (ref 3–11)
AST: 14 U/L (ref 5–34)
Alkaline Phosphatase: 54 U/L (ref 40–150)
BILIRUBIN TOTAL: 0.4 mg/dL (ref 0.2–1.2)
BUN: 25 mg/dL (ref 7–26)
CO2: 22 mmol/L (ref 22–29)
Calcium: 9.8 mg/dL (ref 8.4–10.4)
Chloride: 107 mmol/L (ref 98–109)
Creatinine, Ser: 1.53 mg/dL — ABNORMAL HIGH (ref 0.70–1.30)
GFR calc Af Amer: 45 mL/min — ABNORMAL LOW (ref >60)
GFR calc non Af Amer: 39 mL/min — ABNORMAL LOW (ref >60)
GLUCOSE: 114 mg/dL (ref 70–140)
POTASSIUM: 3.9 mmol/L (ref 3.5–5.1)
SODIUM: 138 mmol/L (ref 136–145)
TOTAL PROTEIN: 6 g/dL — AB (ref 6.4–8.3)

## 2017-08-11 LAB — LACTATE DEHYDROGENASE: LDH: 225 U/L (ref 125–245)

## 2017-08-11 LAB — PROTIME-INR
INR: 1.73
PROTHROMBIN TIME: 20.1 s — AB (ref 11.4–15.2)

## 2017-08-11 MED ORDER — PROCHLORPERAZINE MALEATE 10 MG PO TABS
5.0000 mg | ORAL_TABLET | Freq: Once | ORAL | Status: AC
  Administered 2017-08-11: 5 mg via ORAL

## 2017-08-11 MED ORDER — SODIUM CHLORIDE 0.9 % IV SOLN
Freq: Once | INTRAVENOUS | Status: AC
  Administered 2017-08-11: 12:00:00 via INTRAVENOUS

## 2017-08-11 MED ORDER — ACETAMINOPHEN 325 MG PO TABS
650.0000 mg | ORAL_TABLET | Freq: Once | ORAL | Status: AC
  Administered 2017-08-11: 650 mg via ORAL

## 2017-08-11 MED ORDER — SODIUM CHLORIDE 0.9 % IV SOLN
375.0000 mg/m2 | Freq: Once | INTRAVENOUS | Status: AC
  Administered 2017-08-11: 800 mg via INTRAVENOUS
  Filled 2017-08-11: qty 80

## 2017-08-11 MED ORDER — FAMOTIDINE IN NACL 20-0.9 MG/50ML-% IV SOLN
20.0000 mg | Freq: Once | INTRAVENOUS | Status: AC
  Administered 2017-08-11: 20 mg via INTRAVENOUS

## 2017-08-11 MED ORDER — DIPHENHYDRAMINE HCL 25 MG PO CAPS
50.0000 mg | ORAL_CAPSULE | Freq: Once | ORAL | Status: AC
  Administered 2017-08-11: 50 mg via ORAL

## 2017-08-11 NOTE — Progress Notes (Addendum)
[place] OFFICE PROGRESS NOTE   Diagnosis: Non-Hodgkin's lymphoma  INTERVAL HISTORY:   Mr. Mannella returns as scheduled.  He continues abiraterone.  He completed a cycle of Rituxan 04/27/2017.  He tends to develop nausea during the Rituxan infusion and would like to have anti-emetic premedication today.  No fevers or sweats.  His appetite is stable.  He continues to have intermittent episodes of confusion.  Objective:  Vital signs in last 24 hours:  Blood pressure (!) 130/98, pulse 65, temperature 97.9 F (36.6 C), temperature source Oral, resp. rate 18, SpO2 94 %.    HEENT: No thrush or ulcers. Lymphatics: Approximate 3 cm left low neck/scalene lymph node.  No other palpable cervical, supraclavicular, axillary or inguinal lymph nodes. Resp: Distant breath sounds. Cardio: Regular rate and rhythm. GI: Abdomen soft and nontender.  No hepatosplenomegaly. Vascular: No leg edema. Neuro: Some confusion.  Follows commands.   Lab Results:  Lab Results  Component Value Date   WBC 8.0 08/11/2017   HGB 11.4 (L) 08/11/2017   HCT 33.6 (L) 08/11/2017   MCV 89.1 08/11/2017   PLT 175 08/11/2017   NEUTROABS 5.9 08/11/2017    Imaging:  No results found.  Medications: I have reviewed the patient's current medications.  Assessment/Plan: 1. Non-Hodgkin's lymphoma (follicular grade 3 lymphoma) - status post 6 cycles of Cytoxan/prednisone/rituximab 07/01/2007 through 10/21/2007.   Biopsy of a retroperitoneal mass 05/13/2016 consistent with involvement by low-grade non-Hodgkin's lymphoma  Initiation of weekly Rituxan 06/17/2016; week 4given 07/09/2016.  CT abdomen/pelvis 09/15/2016-improvement in retroperitoneal and pelvic soft tissue  Initiation of maintenance Rituxan every 3 months 10/13/2016 (2 year course planned) 2. CT chest 04/01/2015 with no lymphadenopathy  CT abdomen/pelvis 05/01/2015 with mild progression of abdomen/pelvic lymphadenopathy 3. Prostate  cancer - he has been treated with hormonal therapy, the PSA was stable on 10/15/2014  PSA increased 04/26/2015   Bone scan 05/09/2015 with unchanged increased activity in the thoracic and lumbar spine felt to most likely be degenerative.   Initiation of Abiraterone and prednisone 05/11/2015. Normalization of the PSA on Abiraterone. 4. History of a normocytic anemia secondary to non-Hodgkin's lymphoma, chemotherapy, and renal insufficiency - progressive secondary to GI bleeding, stable 5. History of Mild thrombocytopenia  6. History of bilateral hydronephrosis. Renal ultrasound 09/26/2014 consistent with medical renal disease. No hydronephrosis. Ureter stents removed 10/09/2016. 7. Coronary artery disease - status post coronary artery stent placement. 8. History of noncalcified lung nodules. 9. History of severe neutropenia July 2009 - likely related to delayed toxicity from rituximab. 10. Macular degeneration. 11. Right middle lobe density on a CT of the chest 06/18/2010 measuring 2.5 x 1.6 cm with mildly increased metabolic activity (SUV max 2.5) on a PET scan 06/27/2010. The area of increased density was less confluent and appeared smaller on a restaging CT 09/08/2010. Right middle lobe "scarring" with no new or suspicious pulmonary nodule on a CT 08/04/2011. 12. Mild increased metabolic activity associated with several lymph nodes on the PET scan 06/27/2010 - likely related to lymphoma. No lymphadenopathy in the mediastinum or axillary areas on the CT 08/04/2011. 13. Right hip discomfort-he received a steroid injection by his primary physician without improvement. He saw Dr. Collier Salina and there was a question of a lytic process in the right pelvis. A bone scan on 09/21/2012 revealed no evidence of metastatic disease. 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 Renal insufficiency-progressive CT 01/12/2016 confirmed progressive hydronephrosis  Placement of  bilateral ureter stents 04/10/2016  Ureter  stents removed 10/09/2016 15. Left leg DVT and pulmonary embolism 04/01/2015-on Coumadin 16. Admission 08/23/2015 with severe anemia and GI bleeding  Upper and lower endoscopy 08/26/2015 without a clear source for bleeding identified 17. Leg edema/anasarca-improved with leg wrapping and diuretics 18. Anemia secondary to chronic disease, renal failure, and possibly lymphoma 19. 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   Biopsy retroperitoneal soft tissue 05/13/2016 20. History of elevated calcium-most likely secondary to non-Hodgkin's lymphoma status post pamidronate 06/08/2016, calcitonin 06/11/2016   Disposition: [name] appears unchanged.  A left low neck/scalene lymph node is larger/more prominent on exam today.  Plan to proceed with maintenance Rituxan today as scheduled.  We are referring him for restaging CTs.  He will return for a follow-up visit 08/31/2017 to discuss those results.  He will contact the office in the interim with any problems.  For the prostate cancer he will continue abiraterone/prednisone.  We will follow-up on the PSA from today.  He will continue Coumadin at the current dose; repeat PT/INR when he returns on 08/31/2017.  Patient seen with Dr. Benay Spice.  25 minutes were spent face-to-face at today's visit with the majority of that time involved in counseling/coordination of care.    [name] ANP/GNP-BC   08/11/2017  11:22 AM  This was a shared visit with [name].  The left lower neck node appears enlarged today.  He will complete another cycle of maintenance rituximab today and then undergo restaging CTs.  His mental status appears to have declined recently.  [name], MD

## 2017-08-11 NOTE — Progress Notes (Signed)
1440- Rituxan running at 200 mL/hr when pt was being assisted back from the restroom when RN noted pt to be very flushed in the face and pt not responding to verbal questions.  Pt assisted back into chair, Rituxan paused, NS wide open.  VSS and Sandi Mealy, Pa called to assess pt.  Verbal orders received to administer 20 IV Pepcid. During this time, pt started responding to verbal questions, pt denied itching, hives, or breathing issues.   1446- Family at bedside stated that pt was more confused than usual.  Dr. Benay Spice called to bedside to assess pt.  Per Dr. Benay Spice, monitor pt for 30 minutes and re start Rituxan at 100 mL/hr and try to titrate it 30 minutes after re starting. VS remained stable and documented in epic.  1517- Facial flushing slightly resolved, VSS and Rituxan re started at 100 mL/hr for 50 mLs  1548- infusion increased to 200 mL/hr and pt tolerated remainder of infusion with no other issuses and VS remained stable.

## 2017-08-11 NOTE — Telephone Encounter (Signed)
Scheduled appt per 1/16 los - Gave patient AVS and calender per los - central radiology to contact patient with ct schedule.

## 2017-08-11 NOTE — Patient Instructions (Signed)
[facility] Discharge Instructions for Patients Receiving Chemotherapy  Today you received the following chemotherapy agents:  Rituxan.  To help prevent nausea and vomiting after your treatment, we encourage you to take your nausea medication as directed.   If you develop nausea and vomiting that is not controlled by your nausea medication, call the clinic.   BELOW ARE SYMPTOMS THAT SHOULD BE REPORTED IMMEDIATELY:  *FEVER GREATER THAN 100.5 F  *CHILLS WITH OR WITHOUT FEVER  NAUSEA AND VOMITING THAT IS NOT CONTROLLED WITH YOUR NAUSEA MEDICATION  *UNUSUAL SHORTNESS OF BREATH  *UNUSUAL BRUISING OR BLEEDING  TENDERNESS IN MOUTH AND THROAT WITH OR WITHOUT PRESENCE OF ULCERS  *URINARY PROBLEMS  *BOWEL PROBLEMS  UNUSUAL RASH Items with * indicate a potential emergency and should be followed up as soon as possible.  Feel free to call the clinic should you have any questions or concerns. The clinic phone number is [phone].  Please show the CHEMO ALERT CARD at check-in to the Emergency Department and triage nurse.   

## 2017-08-12 LAB — PROSTATE-SPECIFIC AG, SERUM (LABCORP): Prostate Specific Ag, Serum: 2.2 ng/mL (ref 0.0–4.0)

## 2017-08-13 NOTE — Progress Notes (Signed)
[facility] Progress Note   Edward Woodward [id number] Jul 08, 1927 84 y.o.  Edward Woodward is managed by Dr. Ladell Pier  Actively treated with chemotherapy: yes  Current Therapy: Rituximab  Last Treated: 08/11/2017 (cycle 8, day 1)  Assessment: Plan:    Chemotherapy adverse reaction, initial encounter  Edward Woodward was seen in the infusion room for a suspected chemotherapy reaction. He was receiving rituximab at the time of his reaction. His symptoms included: Flushing and confusion He was premedicated with Tylenol, Benadryl, and Compazine prior to starting chemotherapy. Rituximab was paused was paused. Edward Woodward did  respond to intervention.   This case was discussed with Dr. Benay Spice, who believe that the patient's confusion was increased over his baseline of dementia due to the combination of Benadryl and Compazine that had been given to him.  Please see After Visit Summary for patient specific instructions.  Future Appointments  Date Time Provider [place]  08/31/2017 10:30 AM [facility] None  08/31/2017 11:00 AM Ladell Pier, MD [facility] None  10/13/2017 12:00 PM Deboraha Sprang, MD [facility]  01/07/2018  3:00 PM Narda Amber K, DO [facility] None    No orders of the defined types were placed in this encounter.     Subjective:   Patient ID:  Edward Woodward is a 84 y.o. (DOB 1927/07/03) male.  Chief Complaint: No chief complaint on file.   HPI Edward Woodward was seen in the infusion room for a suspected chemotherapy reaction. He was receiving rituximab at the time of his reaction. His symptoms included: Flushing and confusion. He was premedicated with Tylenol, Benadryl, and Compazine prior to starting chemotherapy. Rituximab was paused was paused. Edward Woodward did  respond to intervention. This case was discussed with Dr. Benay Spice, who believe that the patient's confusion was  increased over his baseline of dementia due to the combination of Benadryl and Compazine that had been given to him.  Medications: I have reviewed the patient's current medications.  Allergies:  Allergies  Allergen Reactions  . Penicillins Hives, Shortness Of Breath and Other (See Comments)    Has patient had a PCN reaction causing immediate rash, facial/tongue/throat swelling, SOB or lightheadedness with hypotension: Yes Has patient had a PCN reaction causing severe rash involving mucus membranes or skin necrosis: No Has patient had a PCN reaction that required hospitalization No Has patient had a PCN reaction occurring within the last 10 years: No If all of the above answers are "NO", then may proceed with Cephalosporin use.   . Simvastatin Other (See Comments)    Reaction:  Muscle weakness    Past Medical History:  Diagnosis Date  . AMD (age-related macular degeneration), bilateral   . Anemia   . Anginal pain ([place])    pts wife states pt had to use nitroglycerin this am 04/09/2016  . CAD (coronary artery disease)     NYHA Class 1B, CABG 1985  . CHF (congestive heart failure) ([place])   . Collagen vascular disease ([place])   . Collapsed lung    right   . Complication of anesthesia    pts wife states pt gets confused   . Degeneration of lumbar or lumbosacral intervertebral disc   . Depressive disorder, not elsewhere classified   . Dysrhythmia   . GERD (gastroesophageal reflux disease)   . History of blood transfusion   . History of hiatal hernia   . HTN (hypertension)   . Hypercholesteremia   .  Lower leg edema    bilat  . Multiple falls   . Myocardial infarction ([place])   . Neuromuscular disorder (HCC)    neuropathy  . Neuropathy   . Non Hodgkin's lymphoma ([place])   . OSA on CPAP   . PE (pulmonary embolism) 03/2015  . Presence of permanent cardiac pacemaker   . Prostate cancer ([place])   . Shortness of breath dyspnea    with exertion   . Sinoatrial node dysfunction (HCC)   .  Ulcers of both lower legs (HCC)    history of   . Urinary incontinence     Past Surgical History:  Procedure Laterality Date  . APPENDECTOMY    . BACK SURGERY    . CARDIAC CATHETERIZATION N/A 08/27/2015   Procedure: Left Heart Cath and Cors/Grafts Angiography;  Surgeon: Belva Crome, MD;  Location: [place] [facility] LAB;  Service: Cardiovascular;  Laterality: N/A;  . COLONOSCOPY N/A 08/26/2015   Procedure: COLONOSCOPY;  Surgeon: Clarene Essex, MD;  Location[facility] ENDOSCOPY;  Service: Endoscopy;  Laterality: N/A;  . CORONARY ANGIOPLASTY WITH STENT PLACEMENT     s/p bare metal stent implant SVG-diagonal 11/23/05, s/p BM stent implantation SVG diagonal 10/22/06 (feeds LAD), and 05/2010 with DES to left circumflex  . [place]   SVG to Diagonal/LAD,  seqSVG to OM1 and OM2  . CYSTOSCOPY W/ URETERAL STENT PLACEMENT Bilateral 04/10/2016   Procedure: CYSTOSCOPY WITH RETROGRADE PYELOGRAM/URETERAL STENT PLACEMENT;  Surgeon: Festus Aloe, MD;  Location: [facility];  Service: Urology;  Laterality: Bilateral;  . CYSTOSCOPY W/ URETERAL STENT PLACEMENT Bilateral 10/09/2016   Procedure: CYSTOSCOPY WITH BILATERAL RETROGRADE PYELOGRAM [name]  URETERAL STENT REMOVAL;  Surgeon: Festus Aloe, MD;  Location: [facility];  Service: Urology;  Laterality: Bilateral;  . ENDARTERECTOMY     bilat   . ESOPHAGOGASTRODUODENOSCOPY (EGD) WITH PROPOFOL N/A 08/26/2015   Procedure: ESOPHAGOGASTRODUODENOSCOPY (EGD) WITH PROPOFOL;  Surgeon: Clarene Essex, MD;  Location: [facility] ENDOSCOPY;  Service: Endoscopy;  Laterality: N/A;  . EXPLORATORY LAPAROTOMY    . LUMBAR FUSION    . PACEMAKER INSERTION    . PPM GENERATOR CHANGEOUT N/A 07/09/2017   Procedure: PPM GENERATOR CHANGEOUT;  Surgeon: Deboraha Sprang, MD;  Location: [place] [facility] LAB;  Service: Cardiovascular;  Laterality: N/A;  . ROTATOR CUFF REPAIR     bilat  . TONSILLECTOMY      Family History  Problem Relation Age of Onset  . Cancer  Mother   . Heart disease Father 28  . Heart disease Brother     Social History   Socioeconomic History  . Marital status: Married    Spouse name: Not on file  . Number of children: 3  . Years of education: Not on file  . Highest education level: Not on file  Social Needs  . Financial resource strain: Not on file  . Food insecurity - worry: Not on file  . Food insecurity - inability: Not on file  . Transportation needs - medical: Not on file  . Transportation needs - non-medical: Not on file  Occupational History  . Occupation: Retired Optometrist  Tobacco Use  . Smoking status: Former Smoker    Packs/day: 0.50    Years: 5.00    Pack years: 2.50    Types: Cigarettes    Last attempt to quit: 07/28/1955    Years since quitting: 62.0  . Smokeless tobacco: Never Used  Substance and Sexual Activity  . Alcohol use:  No  . Drug use: No  . Sexual activity: No  Other Topics Concern  . Not on file  Social History Narrative   Lives with wife in a one story home.  Has 3 children and 2 stepchildren.  Retired Optometrist.  Education: college.    Past Medical History, Surgical history, Social history, and Family history were reviewed and updated as appropriate.   Please see review of systems for further details on the patient's review from today.   Review of Systems:  Review of Systems  Constitutional: Negative for diaphoresis and fever.  HENT: Negative for trouble swallowing.   Respiratory: Negative for cough, chest tightness and shortness of breath.   Skin:       Flushing of the cheeks.  Psychiatric/Behavioral: Positive for confusion.    Objective:   Physical Exam:  There were no vitals taken for this visit. ECOG: 2   Physical Exam  Constitutional: No distress.  The patient is an elderly male with dementia who appears slightly sedate but in no acute distress.  HENT:  Head: Normocephalic and atraumatic.  Cardiovascular: Normal rate, regular rhythm and normal heart sounds.  Exam reveals no gallop and no friction rub.  No murmur heard. Pulmonary/Chest: Effort normal and breath sounds normal. No respiratory distress. He has no rales.  Skin: Skin is warm and dry. He is not diaphoretic. There is erythema (Erythema of the cheeks.).    Lab Review:     Component Value Date/Time   NA 138 08/11/2017 0944   NA 140 07/08/2017 1345   NA 138 05/21/2017 1528   K 3.9 08/11/2017 0944   K 4.4 05/21/2017 1528   CL 107 08/11/2017 0944   CL 105 05/31/2012 1058   CO2 22 08/11/2017 0944   CO2 24 05/21/2017 1528   GLUCOSE 114 08/11/2017 0944   GLUCOSE 127 05/21/2017 1528   GLUCOSE 85 05/31/2012 1058   BUN 25 08/11/2017 0944   BUN 22 07/08/2017 1345   BUN 31.7 (H) 05/21/2017 1528   CREATININE 1.53 (H) 08/11/2017 0944   CREATININE 1.5 (H) 05/21/2017 1528   CALCIUM 9.8 08/11/2017 0944   CALCIUM 10.3 05/21/2017 1528   PROT 6.0 (L) 08/11/2017 0944   PROT 6.0 (L) 05/21/2017 1528   ALBUMIN 3.3 (L) 08/11/2017 0944   ALBUMIN 3.4 (L) 05/21/2017 1528   AST 14 08/11/2017 0944   AST 20 05/21/2017 1528   ALT 7 08/11/2017 0944   ALT 12 05/21/2017 1528   ALKPHOS 54 08/11/2017 0944   ALKPHOS 59 05/21/2017 1528   BILITOT 0.4 08/11/2017 0944   BILITOT 0.49 05/21/2017 1528   GFRNONAA 39 (L) 08/11/2017 0944   GFRAA 45 (L) 08/11/2017 0944       Component Value Date/Time   WBC 8.0 08/11/2017 0944   RBC 3.77 (L) 08/11/2017 0944   HGB 11.4 (L) 08/11/2017 0944   HGB 11.5 (L) 07/08/2017 1345   HGB 10.8 (L) 05/21/2017 1528   HCT 33.6 (L) 08/11/2017 0944   HCT 35.0 (L) 07/08/2017 1345   HCT 33.5 (L) 05/21/2017 1528   PLT 175 08/11/2017 0944   PLT 166 07/08/2017 1345   MCV 89.1 08/11/2017 0944   MCV 88 07/08/2017 1345   MCV 92.3 05/21/2017 1528   MCH 30.1 08/11/2017 0944   MCHC 33.8 08/11/2017 0944   RDW 15.5 08/11/2017 0944   RDW 15.4 07/08/2017 1345   RDW 15.0 (H) 05/21/2017 1528   LYMPHSABS 1.1 08/11/2017 0944   LYMPHSABS 1.3 05/21/2017 1528   MONOABS  0.7 08/11/2017  0944   MONOABS 0.7 05/21/2017 1528   EOSABS 0.2 08/11/2017 0944   EOSABS 0.2 05/21/2017 1528   BASOSABS 0.1 08/11/2017 0944   BASOSABS 0.0 05/21/2017 1528   -------------------------------  Imaging from last 24 hours (if applicable):  Radiology interpretation: No results found.      This patient was seen with Dr. Benay Spice with my treatment plan reviewed with him. He expressed agreement with my medical management of this patient.

## 2017-08-23 DIAGNOSIS — H53411 Scotoma involving central area, right eye: Secondary | ICD-10-CM | POA: Diagnosis not present

## 2017-08-23 DIAGNOSIS — H53413 Scotoma involving central area, bilateral: Secondary | ICD-10-CM | POA: Diagnosis not present

## 2017-08-25 ENCOUNTER — Ambulatory Visit (HOSPITAL_COMMUNITY)
Admission: RE | Admit: 2017-08-25 | Discharge: 2017-08-25 | Disposition: A | Discharge status: Home / Self Care | Payer: Medicare Other | Source: Ambulatory Visit | Attending: Nurse Practitioner | Admitting: Nurse Practitioner

## 2017-08-25 ENCOUNTER — Ambulatory Visit (HOSPITAL_COMMUNITY): Payer: Medicare Other

## 2017-08-25 DIAGNOSIS — R590 Localized enlarged lymph nodes: Secondary | ICD-10-CM | POA: Diagnosis not present

## 2017-08-25 DIAGNOSIS — C8299 Follicular lymphoma, unspecified, extranodal and solid organ sites: Secondary | ICD-10-CM

## 2017-08-25 DIAGNOSIS — I712 Thoracic aortic aneurysm, without rupture: Secondary | ICD-10-CM | POA: Diagnosis not present

## 2017-08-25 DIAGNOSIS — K8020 Calculus of gallbladder without cholecystitis without obstruction: Secondary | ICD-10-CM | POA: Diagnosis not present

## 2017-08-25 DIAGNOSIS — I700 Atherosclerosis of aorta: Secondary | ICD-10-CM | POA: Diagnosis not present

## 2017-08-25 DIAGNOSIS — J439 Emphysema, unspecified: Secondary | ICD-10-CM | POA: Diagnosis not present

## 2017-08-25 DIAGNOSIS — C8589 Other specified types of non-Hodgkin lymphoma, extranodal and solid organ sites: Secondary | ICD-10-CM | POA: Insufficient documentation

## 2017-08-25 DIAGNOSIS — C61 Malignant neoplasm of prostate: Secondary | ICD-10-CM | POA: Diagnosis not present

## 2017-08-25 DIAGNOSIS — J90 Pleural effusion, not elsewhere classified: Secondary | ICD-10-CM | POA: Insufficient documentation

## 2017-08-25 DIAGNOSIS — C8590 Non-Hodgkin lymphoma, unspecified, unspecified site: Secondary | ICD-10-CM | POA: Diagnosis not present

## 2017-08-31 ENCOUNTER — Telehealth: Payer: Self-pay | Admitting: Oncology

## 2017-08-31 ENCOUNTER — Inpatient Hospital Stay: Payer: Medicare Other

## 2017-08-31 ENCOUNTER — Inpatient Hospital Stay: Payer: Medicare Other | Attending: Oncology | Admitting: Oncology

## 2017-08-31 VITALS — BP 123/57 | HR 65 | Temp 97.4°F | Resp 18 | Ht 70.0 in | Wt 183.3 lb

## 2017-08-31 DIAGNOSIS — Z9221 Personal history of antineoplastic chemotherapy: Secondary | ICD-10-CM | POA: Diagnosis not present

## 2017-08-31 DIAGNOSIS — Z86718 Personal history of other venous thrombosis and embolism: Secondary | ICD-10-CM | POA: Insufficient documentation

## 2017-08-31 DIAGNOSIS — C8589 Other specified types of non-Hodgkin lymphoma, extranodal and solid organ sites: Secondary | ICD-10-CM | POA: Diagnosis not present

## 2017-08-31 DIAGNOSIS — Z7901 Long term (current) use of anticoagulants: Secondary | ICD-10-CM | POA: Diagnosis not present

## 2017-08-31 DIAGNOSIS — C8299 Follicular lymphoma, unspecified, extranodal and solid organ sites: Secondary | ICD-10-CM

## 2017-08-31 DIAGNOSIS — C61 Malignant neoplasm of prostate: Secondary | ICD-10-CM

## 2017-08-31 DIAGNOSIS — Z8546 Personal history of malignant neoplasm of prostate: Secondary | ICD-10-CM | POA: Insufficient documentation

## 2017-08-31 DIAGNOSIS — Z79899 Other long term (current) drug therapy: Secondary | ICD-10-CM | POA: Diagnosis not present

## 2017-08-31 DIAGNOSIS — Z5112 Encounter for antineoplastic immunotherapy: Secondary | ICD-10-CM | POA: Diagnosis present

## 2017-08-31 DIAGNOSIS — Z86711 Personal history of pulmonary embolism: Secondary | ICD-10-CM | POA: Diagnosis not present

## 2017-08-31 LAB — PROTIME-INR
INR: 2.15
PROTHROMBIN TIME: 23.8 s — AB (ref 11.4–15.2)

## 2017-08-31 NOTE — Telephone Encounter (Signed)
Scheduled appt per 2/5 los - Gave patient AVS And calender per los.

## 2017-08-31 NOTE — Progress Notes (Signed)
[place] OFFICE PROGRESS NOTE   Diagnosis: Non-Hodgkin's lymphoma, prostate cancer  INTERVAL HISTORY:   Edward Woodward returns for a scheduled visit.  He is accompanied by his wife and daughter.  He has no complaint.  The family report he has increasing difficulty ambulating in the home.  He has memory loss and a poor appetite.  Objective:  Vital signs in last 24 hours:  Blood pressure (!) 123/57, pulse 65, temperature (!) 97.4 F (36.3 C), temperature source Oral, resp. rate 18, height 5\' 10"  (1.778 m), weight 183 lb 4.8 oz (83.1 kg), SpO2 99 %.    HEENT: Neck without mass Lymphatics: 2 cm left low posterior cervical node, 1 cm right inguinal node Resp: Inspiratory rhonchi at the lower posterior chest bilaterally, no respiratory distress Cardio: Regular rate and rhythm GI: No hepatosplenomegaly, nontender, no mass Vascular: No leg edema Neuro: Alert, follows commands, ambulates to the examination table with a 2 person assist     Lab Results:  Lab Results  Component Value Date   WBC 8.0 08/11/2017   HGB 11.4 (L) 08/11/2017   HCT 33.6 (L) 08/11/2017   MCV 89.1 08/11/2017   PLT 175 08/11/2017   NEUTROABS 5.9 08/11/2017    CMP     Component Value Date/Time   NA 138 08/11/2017 0944   NA 140 07/08/2017 1345   NA 138 05/21/2017 1528   K 3.9 08/11/2017 0944   K 4.4 05/21/2017 1528   CL 107 08/11/2017 0944   CL 105 05/31/2012 1058   CO2 22 08/11/2017 0944   CO2 24 05/21/2017 1528   GLUCOSE 114 08/11/2017 0944   GLUCOSE 127 05/21/2017 1528   GLUCOSE 85 05/31/2012 1058   BUN 25 08/11/2017 0944   BUN 22 07/08/2017 1345   BUN 31.7 (H) 05/21/2017 1528   CREATININE 1.53 (H) 08/11/2017 0944   CREATININE 1.5 (H) 05/21/2017 1528   CALCIUM 9.8 08/11/2017 0944   CALCIUM 10.3 05/21/2017 1528   PROT 6.0 (L) 08/11/2017 0944   PROT 6.0 (L) 05/21/2017 1528   ALBUMIN 3.3 (L) 08/11/2017 0944   ALBUMIN 3.4 (L) 05/21/2017 1528   AST 14 08/11/2017 0944   AST 20  05/21/2017 1528   ALT 7 08/11/2017 0944   ALT 12 05/21/2017 1528   ALKPHOS 54 08/11/2017 0944   ALKPHOS 59 05/21/2017 1528   BILITOT 0.4 08/11/2017 0944   BILITOT 0.49 05/21/2017 1528   GFRNONAA 39 (L) 08/11/2017 0944   GFRAA 45 (L) 08/11/2017 0944    No results found for: CEA1  Lab Results  Component Value Date   INR 2.15 08/31/2017    Imaging: CTs 08/25/2017-images reviewed   Medications: I have reviewed the patient's current medications.   Assessment/Plan:  1. Non-Hodgkin's lymphoma (follicular grade 3 lymphoma) - status post 6 cycles of Cytoxan/prednisone/rituximab 07/01/2007 through 10/21/2007.   Biopsy of a retroperitoneal mass 05/13/2016 consistent with involvement by low-grade non-Hodgkin's lymphoma  Initiation of weekly Rituxan 06/17/2016; week 4given 07/09/2016.  CT abdomen/pelvis 09/15/2016-improvement in retroperitoneal and pelvic soft tissue  Initiation of maintenance Rituxan every 3 months 10/13/2016 (2 year course planned)  CTs 08/25/2017- new left axillary lymph node, enlargement of left pleural effusion, less masslike soft tissue in the mid retroperitoneum, new areas of retroperitoneal and pelvic lymphadenopathy.  Mildly enlarged neck lymph nodes 2. CT chest 04/01/2015 with no lymphadenopathy  CT abdomen/pelvis 05/01/2015 with mild progression of abdomen/pelvic lymphadenopathy 3. Prostate cancer - he has been treated with hormonal therapy, the PSA was stable  on 10/15/2014  PSA increased 04/26/2015   Bone scan 05/09/2015 with unchanged increased activity in the thoracic and lumbar spine felt to most likely be degenerative.   Initiation of Abiraterone and prednisone 05/11/2015. Normalization of the PSA on Abiraterone. 4. History of a normocytic anemia secondary to non-Hodgkin's lymphoma, chemotherapy, and renal insufficiency - progressive secondary to GI bleeding, stable 5. History of Mild thrombocytopenia  6. History of bilateral hydronephrosis.  Renal ultrasound 09/26/2014 consistent with medical renal disease. No hydronephrosis. Ureter stents removed 10/09/2016. 7. Coronary artery disease - status post coronary artery stent placement. 8. History of noncalcified lung nodules. 9. History of severe neutropenia July 2009 - likely related to delayed toxicity from rituximab. 10. Macular degeneration. 11. Right middle lobe density on a CT of the chest 06/18/2010 measuring 2.5 x 1.6 cm with mildly increased metabolic activity (SUV max 2.5) on a PET scan 06/27/2010. The area of increased density was less confluent and appeared smaller on a restaging CT 09/08/2010. Right middle lobe "scarring" with no new or suspicious pulmonary nodule on a CT 08/04/2011. 12. Mild increased metabolic activity associated with several lymph nodes on the PET scan 06/27/2010 - likely related to lymphoma. No lymphadenopathy in the mediastinum or axillary areas on the CT 08/04/2011. 13. Right hip discomfort-he received a steroid injection by his primary physician without improvement. He saw Dr. Collier Salina and there was a question of a lytic process in the right pelvis. A bone scan on 09/21/2012 revealed no evidence of metastatic disease. 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 Renal insufficiency-progressive CT 01/12/2016 confirmed progressive hydronephrosis  Placement of bilateral ureter stents 04/10/2016  Ureter stents removed 10/09/2016 15. Left leg DVT and pulmonary embolism 04/01/2015-on Coumadin 16. Admission 08/23/2015 with severe anemia and GI bleeding  Upper and lower endoscopy 08/26/2015 without a clear source for bleeding identified 17. Leg edema/anasarca-improved with leg wrapping and diuretics 18. Anemia secondary to chronic disease, renal failure, and possibly lymphoma 19. 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   Biopsy  retroperitoneal soft tissue 05/13/2016 20. History of elevated calcium-most likely secondary to non-Hodgkin's lymphoma status post pamidronate 06/08/2016, calcitonin 06/11/2016   Disposition: Edward Woodward has a history of low-grade non-Hodgkin's lymphoma.  The restaging CTs are consistent with disease progression.  It is difficult to know whether he is symptomatic from the lymphoma.  His performance status is declining.  This may be related to dementia and multiple comorbid conditions including renal failure, neuropathy, and cardiac disease.  He is maintained on abiraterone/prednisone for prostate cancer.  I doubt the CT findings are related to prostate cancer, though the PSA has been higher over the past several months.  I discussed the risk/benefit of salvage systemic therapy with Edward Woodward and his family.  They understand systemic chemotherapy may not improve his performance status.  He improved significantly we he was treated with rituximab in 2017.  We discussed bendamustine therapy.  We reviewed potential toxicities associated with this agent including the chance for hematologic toxicity.  He would like to begin a trial of bendamustine.  The plan is to begin bendamustine 09/09/2017.  He will return for an office visit and nadir CBC on 09/23/2017.  40 minutes were spent with the patient today.  The majority of the time was used for counseling and coordination of care.  [name], MD  09/01/2017  8:28 AM

## 2017-09-01 NOTE — Progress Notes (Signed)
START OFF PATHWAY REGIMEN - Lymphoma and CLL   [id number]:Bendamustine 120 mg/m2 IV Days 1 and 2, q28 Days:   A cycle is every 28 days:     Bendamustine      [id number]   **Always confirm dose/schedule in your pharmacy ordering system**    Patient Characteristics: Follicular Lymphoma, Grades 1, 2, and 3A, Third Line Disease Type: Follicular Lymphoma Disease Type: Not Applicable Disease Type: Not Applicable [place] Stage: III Tumor Grade: 3A Line of therapy: Third Line Intent of Therapy: Non-Curative / Palliative Intent, Discussed with Patient

## 2017-09-03 DIAGNOSIS — N183 Chronic kidney disease, stage 3 (moderate): Secondary | ICD-10-CM | POA: Diagnosis not present

## 2017-09-03 DIAGNOSIS — I509 Heart failure, unspecified: Secondary | ICD-10-CM | POA: Diagnosis not present

## 2017-09-03 DIAGNOSIS — I129 Hypertensive chronic kidney disease with stage 1 through stage 4 chronic kidney disease, or unspecified chronic kidney disease: Secondary | ICD-10-CM | POA: Diagnosis not present

## 2017-09-03 DIAGNOSIS — R410 Disorientation, unspecified: Secondary | ICD-10-CM | POA: Diagnosis not present

## 2017-09-03 DIAGNOSIS — F321 Major depressive disorder, single episode, moderate: Secondary | ICD-10-CM | POA: Diagnosis not present

## 2017-09-03 DIAGNOSIS — D696 Thrombocytopenia, unspecified: Secondary | ICD-10-CM | POA: Diagnosis not present

## 2017-09-03 DIAGNOSIS — I5032 Chronic diastolic (congestive) heart failure: Secondary | ICD-10-CM | POA: Diagnosis not present

## 2017-09-05 ENCOUNTER — Other Ambulatory Visit: Payer: Self-pay | Admitting: Oncology

## 2017-09-09 ENCOUNTER — Inpatient Hospital Stay: Payer: Medicare Other

## 2017-09-09 ENCOUNTER — Other Ambulatory Visit: Payer: Self-pay | Admitting: Emergency Medicine

## 2017-09-09 VITALS — BP 146/57 | HR 65 | Temp 97.0°F | Resp 18

## 2017-09-09 DIAGNOSIS — Z7901 Long term (current) use of anticoagulants: Secondary | ICD-10-CM

## 2017-09-09 DIAGNOSIS — Z86711 Personal history of pulmonary embolism: Secondary | ICD-10-CM | POA: Diagnosis not present

## 2017-09-09 DIAGNOSIS — C8589 Other specified types of non-Hodgkin lymphoma, extranodal and solid organ sites: Secondary | ICD-10-CM | POA: Diagnosis not present

## 2017-09-09 DIAGNOSIS — C8299 Follicular lymphoma, unspecified, extranodal and solid organ sites: Secondary | ICD-10-CM

## 2017-09-09 DIAGNOSIS — Z86718 Personal history of other venous thrombosis and embolism: Secondary | ICD-10-CM | POA: Diagnosis not present

## 2017-09-09 DIAGNOSIS — Z8546 Personal history of malignant neoplasm of prostate: Secondary | ICD-10-CM | POA: Diagnosis not present

## 2017-09-09 DIAGNOSIS — Z79899 Other long term (current) drug therapy: Secondary | ICD-10-CM | POA: Diagnosis not present

## 2017-09-09 LAB — CBC WITH DIFFERENTIAL (CANCER CENTER ONLY)
BASOS ABS: 0 10*3/uL (ref 0.0–0.1)
BASOS PCT: 0 %
EOS ABS: 0.1 10*3/uL (ref 0.0–0.5)
EOS PCT: 2 %
HCT: 34.8 % — ABNORMAL LOW (ref 38.4–49.9)
Hemoglobin: 11.6 g/dL — ABNORMAL LOW (ref 13.0–17.1)
LYMPHS PCT: 28 %
Lymphs Abs: 1.5 10*3/uL (ref 0.9–3.3)
MCH: 29.7 pg (ref 27.2–33.4)
MCHC: 33.3 g/dL (ref 32.0–36.0)
MCV: 89.2 fL (ref 79.3–98.0)
Monocytes Absolute: 0.5 10*3/uL (ref 0.1–0.9)
Monocytes Relative: 9 %
Neutro Abs: 3.3 10*3/uL (ref 1.5–6.5)
Neutrophils Relative %: 61 %
PLATELETS: 150 10*3/uL (ref 140–400)
RBC: 3.9 MIL/uL — ABNORMAL LOW (ref 4.20–5.82)
RDW: 14 % (ref 11.0–14.6)
WBC: 5.4 10*3/uL (ref 4.0–10.3)

## 2017-09-09 LAB — CMP (CANCER CENTER ONLY)
ALT: 8 U/L (ref 0–55)
AST: 18 U/L (ref 5–34)
Albumin: 3.3 g/dL — ABNORMAL LOW (ref 3.5–5.0)
Alkaline Phosphatase: 53 U/L (ref 40–150)
Anion gap: 10 (ref 3–11)
BUN: 22 mg/dL (ref 7–26)
CHLORIDE: 106 mmol/L (ref 98–109)
CO2: 22 mmol/L (ref 22–29)
Calcium: 10.1 mg/dL (ref 8.4–10.4)
Creatinine: 1.41 mg/dL — ABNORMAL HIGH (ref 0.70–1.30)
GFR, EST NON AFRICAN AMERICAN: 43 mL/min — AB (ref >60)
GFR, Est AFR Am: 50 mL/min — ABNORMAL LOW (ref >60)
Glucose, Bld: 102 mg/dL (ref 70–140)
POTASSIUM: 4.2 mmol/L (ref 3.5–5.1)
SODIUM: 138 mmol/L (ref 136–145)
Total Bilirubin: 0.5 mg/dL (ref 0.2–1.2)
Total Protein: 6 g/dL — ABNORMAL LOW (ref 6.4–8.3)

## 2017-09-09 MED ORDER — PALONOSETRON HCL INJECTION 0.25 MG/5ML
0.2500 mg | Freq: Once | INTRAVENOUS | Status: AC
  Administered 2017-09-09: 0.25 mg via INTRAVENOUS

## 2017-09-09 MED ORDER — DEXAMETHASONE SODIUM PHOSPHATE 10 MG/ML IJ SOLN
INTRAMUSCULAR | Status: AC
  Filled 2017-09-09: qty 1

## 2017-09-09 MED ORDER — SODIUM CHLORIDE 0.9 % IV SOLN
Freq: Once | INTRAVENOUS | Status: AC
  Administered 2017-09-09: 14:00:00 via INTRAVENOUS

## 2017-09-09 MED ORDER — WARFARIN SODIUM 5 MG PO TABS: 5.0000 mg | ORAL_TABLET | Freq: Every day | ORAL | 1 refills | Status: DC

## 2017-09-09 MED ORDER — PALONOSETRON HCL INJECTION 0.25 MG/5ML
INTRAVENOUS | Status: AC
  Filled 2017-09-09: qty 5

## 2017-09-09 MED ORDER — SODIUM CHLORIDE 0.9 % IV SOLN
70.0000 mg/m2 | Freq: Once | INTRAVENOUS | Status: AC
  Administered 2017-09-09: 150 mg via INTRAVENOUS
  Filled 2017-09-09: qty 6

## 2017-09-09 MED ORDER — DEXAMETHASONE SODIUM PHOSPHATE 10 MG/ML IJ SOLN
10.0000 mg | Freq: Once | INTRAMUSCULAR | Status: AC
  Administered 2017-09-09: 10 mg via INTRAVENOUS

## 2017-09-09 NOTE — Patient Instructions (Signed)
Today you received the following chemotherapy agents Bendeka  To help prevent nausea and vomiting after your treatment, we encourage you to take your nausea medication    If you develop nausea and vomiting that is not controlled by your nausea medication, call the clinic.   BELOW ARE SYMPTOMS THAT SHOULD BE REPORTED IMMEDIATELY:  *FEVER GREATER THAN 100.5 F  *CHILLS WITH OR WITHOUT FEVER  NAUSEA AND VOMITING THAT IS NOT CONTROLLED WITH YOUR NAUSEA MEDICATION  *UNUSUAL SHORTNESS OF BREATH  *UNUSUAL BRUISING OR BLEEDING  TENDERNESS IN MOUTH AND THROAT WITH OR WITHOUT PRESENCE OF ULCERS  *URINARY PROBLEMS  *BOWEL PROBLEMS  UNUSUAL RASH Items with * indicate a potential emergency and should be followed up as soon as possible.  Feel free to call the clinic should you have any questions or concerns. The clinic phone number is [phone].  Please show the [facility] at check-in to the Emergency Department and triage nurse.  Bendamustine InjectionVerl Dicker) What is this medicine? BENDAMUSTINE (BEN da MUS teen) is a chemotherapy drug. It is used to treat chronic lymphocytic leukemia and non-Hodgkin lymphoma. This medicine may be used for other purposes; ask your health care provider or pharmacist if you have questions. COMMON BRAND NAME(S): BENDEKA, Treanda What should I tell my health care provider before I take this medicine? They need to know if you have any of these conditions: -infection (especially a virus infection such as chickenpox, cold sores, or herpes) -kidney disease -liver disease -an unusual or allergic reaction to bendamustine, mannitol, other medicines, foods, dyes, or preservatives -pregnant or trying to get pregnant -breast-feeding How should I use this medicine? This medicine is for infusion into a vein. It is given by a health care professional in a hospital or clinic setting. Talk to your pediatrician regarding the use of this medicine in  children. Special care may be needed. Overdosage: If you think you have taken too much of this medicine contact a poison control center or emergency room at once. NOTE: This medicine is only for you. Do not share this medicine with others. What if I miss a dose? It is important not to miss your dose. Call your doctor or health care professional if you are unable to keep an appointment. What may interact with this medicine? Do not take this medicine with any of the following medications: -clozapine This medicine may also interact with the following medications: -atazanavir -cimetidine -ciprofloxacin -enoxacin -fluvoxamine -medicines for seizures like carbamazepine and phenobarbital -mexiletine -rifampin -tacrine -thiabendazole -zileuton This list may not describe all possible interactions. Give your health care provider a list of all the medicines, herbs, non-prescription drugs, or dietary supplements you use. Also tell them if you smoke, drink alcohol, or use illegal drugs. Some items may interact with your medicine. What should I watch for while using this medicine? This drug may make you feel generally unwell. This is not uncommon, as chemotherapy can affect healthy cells as well as cancer cells. Report any side effects. Continue your course of treatment even though you feel ill unless your doctor tells you to stop. You may need blood work done while you are taking this medicine. Call your doctor or health care professional for advice if you get a fever, chills or sore throat, or other symptoms of a cold or flu. Do not treat yourself. This drug decreases your body's ability to fight infections. Try to avoid being around people who are sick. This medicine may increase your risk to bruise or  bleed. Call your doctor or health care professional if you notice any unusual bleeding. Talk to your doctor about your risk of cancer. You may be more at risk for certain types of cancers if you take  this medicine. Do not become pregnant while taking this medicine or for 3 months after stopping it. Women should inform their doctor if they wish to become pregnant or think they might be pregnant. Men should not father a child while taking this medicine and for 3 months after stopping it.There is a potential for serious side effects to an unborn child. Talk to your health care professional or pharmacist for more information. Do not breast-feed an infant while taking this medicine. This medicine may interfere with the ability to have a child. You should talk with your doctor or health care professional if you are concerned about your fertility. What side effects may I notice from receiving this medicine? Side effects that you should report to your doctor or health care professional as soon as possible: -allergic reactions like skin rash, itching or hives, swelling of the face, lips, or tongue -low blood counts - this medicine may decrease the number of white blood cells, red blood cells and platelets. You may be at increased risk for infections and bleeding. -redness, blistering, peeling or loosening of the skin, including inside the mouth -signs of infection - fever or chills, cough, sore throat, pain or difficulty passing urine -signs of decreased platelets or bleeding - bruising, pinpoint red spots on the skin, black, tarry stools, blood in the urine -signs of decreased red blood cells - unusually weak or tired, fainting spells, lightheadedness -signs and symptoms of kidney injury like trouble passing urine or change in the amount of urine -signs and symptoms of liver injury like dark yellow or brown urine; general ill feeling or flu-like symptoms; light-colored stools; loss of appetite; nausea; right upper belly pain; unusually weak or tired; yellowing of the eyes or skin Side effects that usually do not require medical attention (report to your doctor or health care professional if they continue or  are bothersome): -constipation -decreased appetite -diarrhea -headache -mouth sores -nausea/vomiting -tiredness This list may not describe all possible side effects. Call your doctor for medical advice about side effects. You may report side effects to FDA at 1-800-FDA-1088. Where should I keep my medicine? This drug is given in a hospital or clinic and will not be stored at home. NOTE: This sheet is a summary. It may not cover all possible information. If you have questions about this medicine, talk to your doctor, pharmacist, or health care provider.  2018 Elsevier/Gold Standard (2015-05-16 08:45:41) [facility] Discharge Instructions for Patients Receiving Chemotherapy

## 2017-09-10 ENCOUNTER — Inpatient Hospital Stay: Payer: Medicare Other

## 2017-09-10 VITALS — BP 168/66 | HR 68 | Temp 98.2°F | Resp 16

## 2017-09-10 DIAGNOSIS — C8589 Other specified types of non-Hodgkin lymphoma, extranodal and solid organ sites: Secondary | ICD-10-CM | POA: Diagnosis not present

## 2017-09-10 DIAGNOSIS — Z8546 Personal history of malignant neoplasm of prostate: Secondary | ICD-10-CM | POA: Diagnosis not present

## 2017-09-10 DIAGNOSIS — Z7901 Long term (current) use of anticoagulants: Secondary | ICD-10-CM | POA: Diagnosis not present

## 2017-09-10 DIAGNOSIS — C8299 Follicular lymphoma, unspecified, extranodal and solid organ sites: Secondary | ICD-10-CM

## 2017-09-10 DIAGNOSIS — Z86718 Personal history of other venous thrombosis and embolism: Secondary | ICD-10-CM | POA: Diagnosis not present

## 2017-09-10 DIAGNOSIS — Z86711 Personal history of pulmonary embolism: Secondary | ICD-10-CM | POA: Diagnosis not present

## 2017-09-10 DIAGNOSIS — Z79899 Other long term (current) drug therapy: Secondary | ICD-10-CM | POA: Diagnosis not present

## 2017-09-10 MED ORDER — SODIUM CHLORIDE 0.9 % IV SOLN
Freq: Once | INTRAVENOUS | Status: AC
  Administered 2017-09-10: 10:00:00 via INTRAVENOUS

## 2017-09-10 MED ORDER — DEXAMETHASONE SODIUM PHOSPHATE 10 MG/ML IJ SOLN
10.0000 mg | Freq: Once | INTRAMUSCULAR | Status: AC
  Administered 2017-09-10: 10 mg via INTRAVENOUS

## 2017-09-10 MED ORDER — DEXAMETHASONE SODIUM PHOSPHATE 10 MG/ML IJ SOLN
INTRAMUSCULAR | Status: AC
  Filled 2017-09-10: qty 1

## 2017-09-10 MED ORDER — SODIUM CHLORIDE 0.9 % IV SOLN
70.0000 mg/m2 | Freq: Once | INTRAVENOUS | Status: AC
  Administered 2017-09-10: 150 mg via INTRAVENOUS
  Filled 2017-09-10: qty 6

## 2017-09-10 NOTE — Patient Instructions (Signed)
[facility] Discharge Instructions for Patients Receiving Chemotherapy  Today you received the following chemotherapy agents bendeka  To help prevent nausea and vomiting after your treatment, we encourage you to take your nausea medication as directed   If you develop nausea and vomiting that is not controlled by your nausea medication, call the clinic.   BELOW ARE SYMPTOMS THAT SHOULD BE REPORTED IMMEDIATELY:  *FEVER GREATER THAN 100.5 F  *CHILLS WITH OR WITHOUT FEVER  NAUSEA AND VOMITING THAT IS NOT CONTROLLED WITH YOUR NAUSEA MEDICATION  *UNUSUAL SHORTNESS OF BREATH  *UNUSUAL BRUISING OR BLEEDING  TENDERNESS IN MOUTH AND THROAT WITH OR WITHOUT PRESENCE OF ULCERS  *URINARY PROBLEMS  *BOWEL PROBLEMS  UNUSUAL RASH Items with * indicate a potential emergency and should be followed up as soon as possible.  Feel free to call the clinic you have any questions or concerns. The clinic phone number is [phone].  

## 2017-09-15 IMAGING — CT CT CERVICAL SPINE W/O CM
3 of 7 series · 11 of 33 positions shown, 12 images · non-contrast
Comparison: 01/12/2016 head CT and 04/15/2015 cervical spine CT.

CLINICAL DATA: Fall.  Anticoagulated.

EXAM:
CT HEAD WITHOUT CONTRAST
CT CERVICAL SPINE WITHOUT CONTRAST
TECHNIQUE: Multidetector CT imaging of the head and cervical spine was
performed following the standard protocol without intravenous
contrast. Multiplanar CT image reconstructions of the cervical spine
were also generated.

[Series 4: head 3.0 mpr cor · coronal · 0.29mm/px · 2 of 69 slices shown]
[im 23/69  bone]
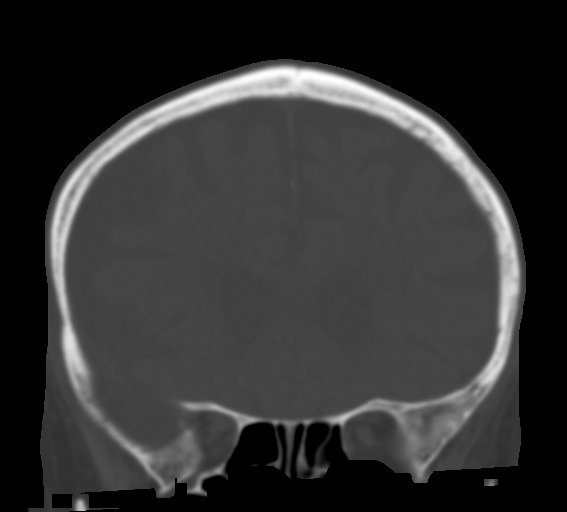
[im 46/69  bone]
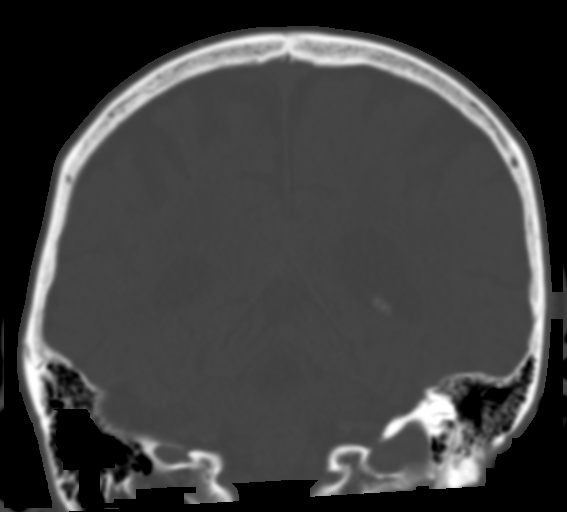

[Series 10: sagittals · sagittal · 0.31mm/px · 5 of 76 slices shown]
[im 13/76  bone]
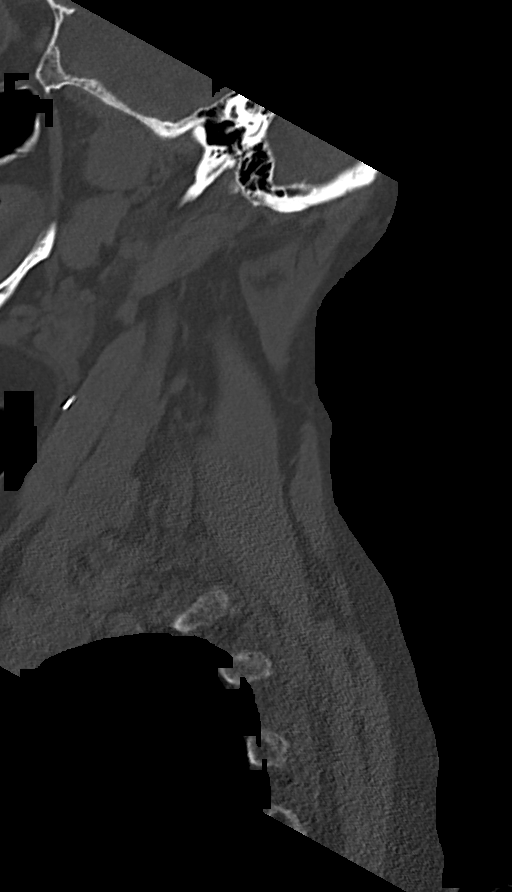
[im 26/76  bone]
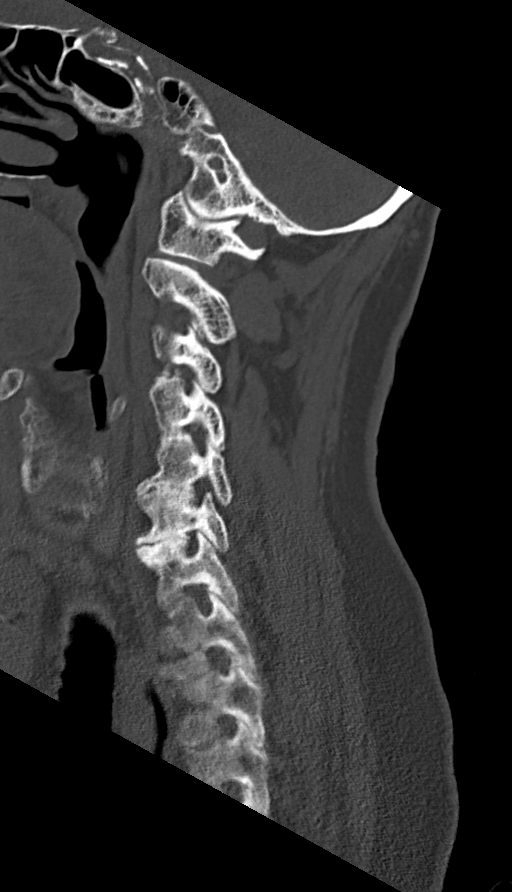
[im 38/76  bone]
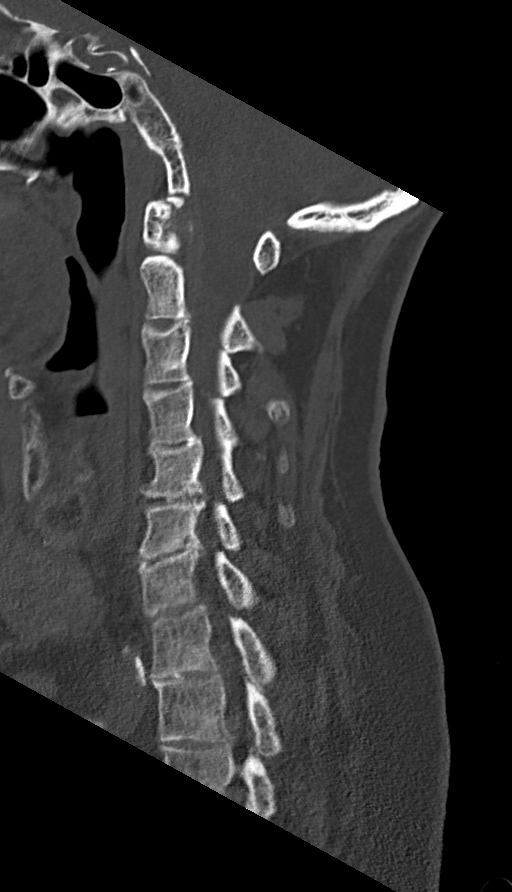
[im 51/76  bone]
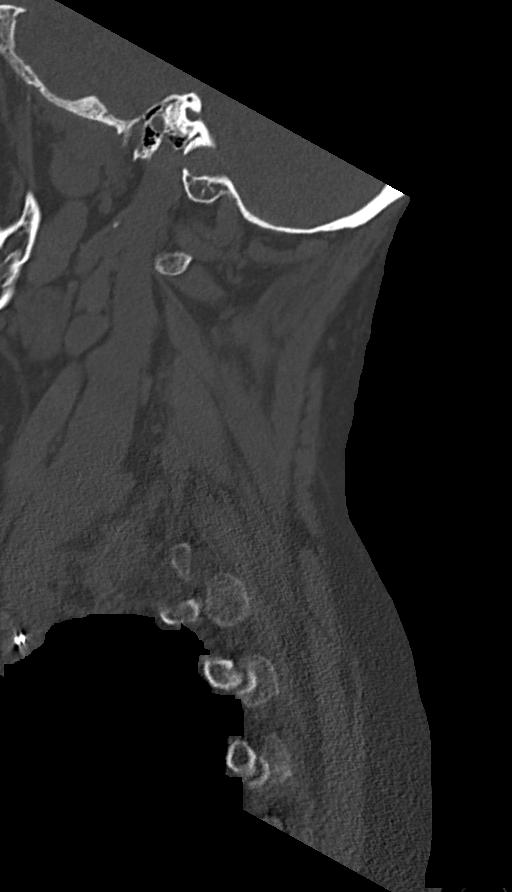
[im 63/76  bone]
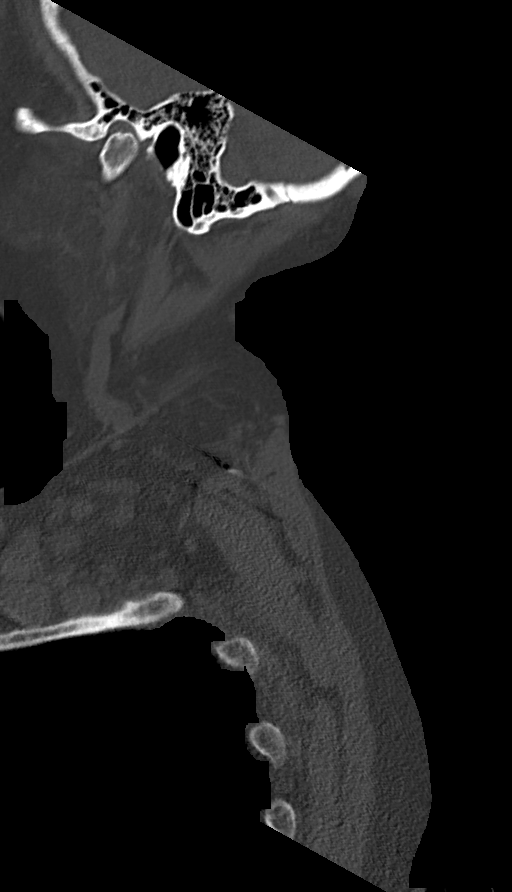

[Series 11: orthogonals · axial · 0.27mm/px · z∈[-347,-188]mm · 4 of 132 slices shown, 5 images]
[im 22/132  soft-tissue]
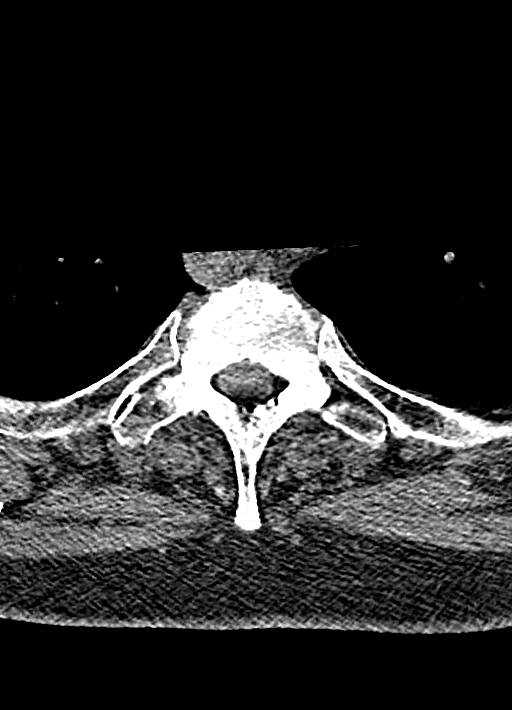
[im 22/132  bone]
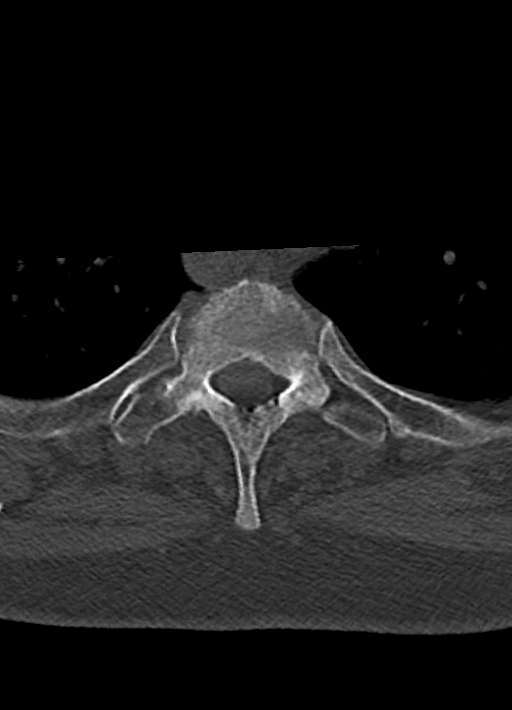
[im 44/132  bone]
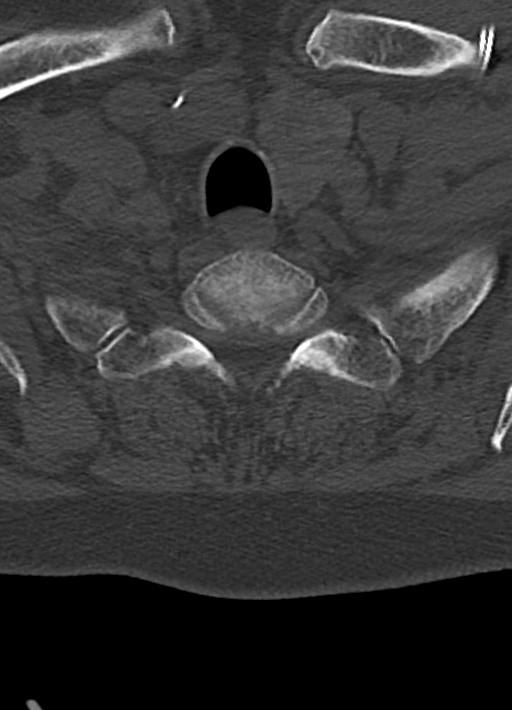
[im 88/132  bone]
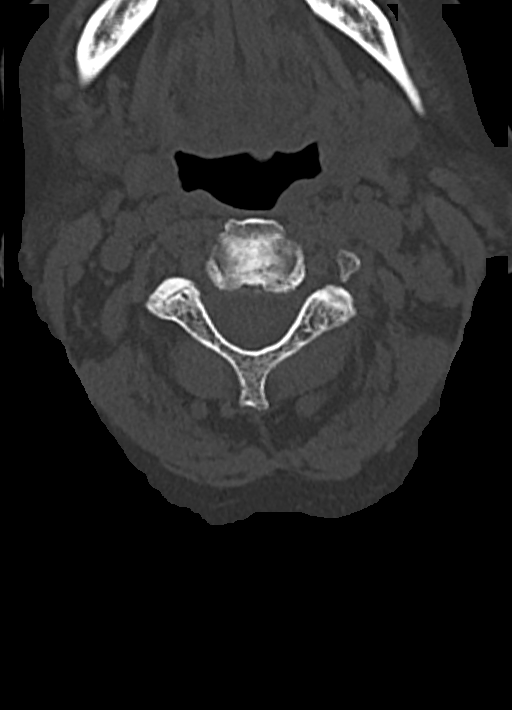
[im 110/132  bone]
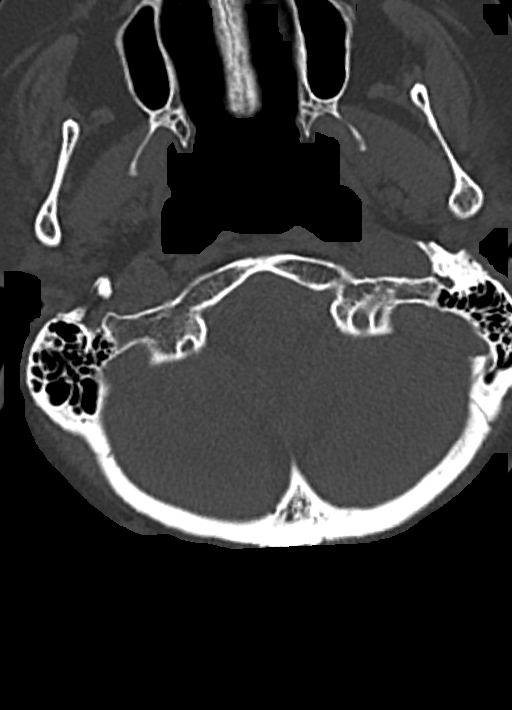

[11 of 33 positions shown; findings below may reference images not displayed]

FINDINGS: CT HEAD FINDINGS

Brain: No evidence of parenchymal hemorrhage or extra-axial fluid
collection. No mass lesion, mass effect, or midline shift. No CT
evidence of acute infarction. Generalized cerebral volume loss.
Intracranial atherosclerosis. Nonspecific moderate subcortical and
periventricular white matter hypodensity, most in keeping with
chronic small vessel ischemic change. No ventriculomegaly.

Vascular: No hyperdense vessel or unexpected calcification.

Skull: No evidence of calvarial fracture.

Sinuses/Orbits: The visualized paranasal sinuses are essentially
clear. Bilateral scleral banding.

Other:  The mastoid air cells are unopacified.

CT CERVICAL SPINE FINDINGS

No fracture is detected in the cervical spine. No prevertebral soft
tissue swelling. There is straightening of the cervical spine,
usually due to positioning and/or muscle spasm. Dens is well
positioned between the lateral masses of C1. The lateral masses
appear well-aligned. Moderate to severe cervical spondylosis, most
prominent in the lower cervical spine at C5-6 and C6-7. Moderate to
severe facet arthropathy bilaterally. Mild foraminal stenosis
bilaterally at C3-4. Moderate bilateral foraminal stenosis at C4-5.
Mild foraminal stenosis on the right at C5-6. Moderate foraminal
stenosis bilaterally at C6-7. No cervical spine subluxation.

Visualized mastoid air cells appear clear. No evidence of
intra-axial hemorrhage in the visualized brain. No gross cervical
canal hematoma. No significant pulmonary nodules at the visualized
lung apices. Stable heterogeneous thyroid gland with left thyroid
lobe calcifications. Stable metallic densities in the left posterior
neck fat. Partially visualized 2 lead left subclavian pacemaker. No
cervical adenopathy or other significant neck soft tissue
abnormality.
IMPRESSION: 1. No evidence of acute intracranial abnormality. No evidence of
calvarial fracture.
2. Generalized cerebral volume loss and moderate chronic small
vessel ischemia.
3. No cervical spine fracture or subluxation.
4. Moderate to severe degenerative changes in the cervical spine as
described.

## 2017-09-23 ENCOUNTER — Telehealth: Payer: Self-pay | Admitting: Nurse Practitioner

## 2017-09-23 ENCOUNTER — Inpatient Hospital Stay: Payer: Medicare Other

## 2017-09-23 ENCOUNTER — Encounter: Payer: Self-pay | Admitting: Nurse Practitioner

## 2017-09-23 ENCOUNTER — Inpatient Hospital Stay (HOSPITAL_BASED_OUTPATIENT_CLINIC_OR_DEPARTMENT_OTHER): Payer: Medicare Other | Admitting: Nurse Practitioner

## 2017-09-23 VITALS — BP 139/57 | HR 65 | Temp 97.8°F | Resp 17 | Ht 70.0 in | Wt 185.8 lb

## 2017-09-23 DIAGNOSIS — C8299 Follicular lymphoma, unspecified, extranodal and solid organ sites: Secondary | ICD-10-CM

## 2017-09-23 DIAGNOSIS — N1339 Other hydronephrosis: Secondary | ICD-10-CM | POA: Diagnosis not present

## 2017-09-23 DIAGNOSIS — Z86718 Personal history of other venous thrombosis and embolism: Secondary | ICD-10-CM | POA: Diagnosis not present

## 2017-09-23 DIAGNOSIS — C8589 Other specified types of non-Hodgkin lymphoma, extranodal and solid organ sites: Secondary | ICD-10-CM | POA: Diagnosis not present

## 2017-09-23 DIAGNOSIS — Z8546 Personal history of malignant neoplasm of prostate: Secondary | ICD-10-CM | POA: Diagnosis not present

## 2017-09-23 DIAGNOSIS — Z86711 Personal history of pulmonary embolism: Secondary | ICD-10-CM | POA: Diagnosis not present

## 2017-09-23 DIAGNOSIS — R350 Frequency of micturition: Secondary | ICD-10-CM | POA: Diagnosis not present

## 2017-09-23 DIAGNOSIS — Z9221 Personal history of antineoplastic chemotherapy: Secondary | ICD-10-CM

## 2017-09-23 DIAGNOSIS — Z7901 Long term (current) use of anticoagulants: Secondary | ICD-10-CM

## 2017-09-23 DIAGNOSIS — C61 Malignant neoplasm of prostate: Secondary | ICD-10-CM | POA: Diagnosis not present

## 2017-09-23 DIAGNOSIS — Z79899 Other long term (current) drug therapy: Secondary | ICD-10-CM | POA: Diagnosis not present

## 2017-09-23 LAB — CBC WITH DIFFERENTIAL (CANCER CENTER ONLY)
BASOS PCT: 1 %
Basophils Absolute: 0 10*3/uL (ref 0.0–0.1)
EOS PCT: 6 %
Eosinophils Absolute: 0.2 10*3/uL (ref 0.0–0.5)
HCT: 33.6 % — ABNORMAL LOW (ref 38.4–49.9)
Hemoglobin: 11.1 g/dL — ABNORMAL LOW (ref 13.0–17.1)
Lymphocytes Relative: 17 %
Lymphs Abs: 0.4 10*3/uL — ABNORMAL LOW (ref 0.9–3.3)
MCH: 29.7 pg (ref 27.2–33.4)
MCHC: 33.1 g/dL (ref 32.0–36.0)
MCV: 89.8 fL (ref 79.3–98.0)
MONO ABS: 0.6 10*3/uL (ref 0.1–0.9)
Monocytes Relative: 22 %
Neutro Abs: 1.4 10*3/uL — ABNORMAL LOW (ref 1.5–6.5)
Neutrophils Relative %: 54 %
PLATELETS: 158 10*3/uL (ref 140–400)
RBC: 3.74 MIL/uL — AB (ref 4.20–5.82)
RDW: 15.3 % — AB (ref 11.0–14.6)
WBC: 2.7 10*3/uL — AB (ref 4.0–10.3)

## 2017-09-23 LAB — PROTIME-INR
INR: 1.93
Prothrombin Time: 21.9 seconds — ABNORMAL HIGH (ref 11.4–15.2)

## 2017-09-23 NOTE — Telephone Encounter (Signed)
Scheduled appt per 2/28 los - Gave patient AVS and calender per los.  

## 2017-09-23 NOTE — Progress Notes (Addendum)
[facility] OFFICE PROGRESS NOTE   Diagnosis: Non-Hodgkin's lymphoma, prostate cancer  INTERVAL HISTORY:   Edward Woodward returns as scheduled.  He completed cycle 1 bendamustine beginning 09/09/2017.  He denies nausea/vomiting.  No mouth sores.  No diarrhea.  He has constipation.  He takes Colace every day and MiraLAX as needed.  He thinks the left neck lymph node is "gone".  His wife notes he seems stronger and less confused.  Celexa and Klonopin doses have been decreased per PCP.  Objective:  Vital signs in last 24 hours:  Blood pressure (!) 139/57, pulse 65, temperature 97.8 F (36.6 C), temperature source Oral, resp. rate 17, height 5\' 10"  (1.778 m), weight 185 lb 12.8 oz (84.3 kg), SpO2 100 %.    HEENT: No thrush or ulcers. Lymphatics: No palpable cervical or supraclavicular lymph nodes. Resp: Lungs clear bilaterally. Cardio: Regular rate and rhythm. GI: Abdomen soft and nontender.  No hepatomegaly. Vascular: No leg edema. Neuro: He is alert.  Does not appear confused.  Follows commands.   Lab Results:  Lab Results  Component Value Date   WBC 2.7 (L) 09/23/2017   HGB 11.4 (L) 08/11/2017   HCT 33.6 (L) 09/23/2017   MCV 89.8 09/23/2017   PLT 158 09/23/2017   NEUTROABS 1.4 (L) 09/23/2017    Imaging:  No results found.  Medications: I have reviewed the patient's current medications.  Assessment/Plan: 1. Non-Hodgkin's lymphoma (follicular grade 3 lymphoma) - status post 6 cycles of Cytoxan/prednisone/rituximab 07/01/2007 through 10/21/2007.   Biopsy of a retroperitoneal mass 05/13/2016 consistent with involvement by low-grade non-Hodgkin's lymphoma  Initiation of weekly Rituxan 06/17/2016; week 4given 07/09/2016.  CT abdomen/pelvis 09/15/2016-improvement in retroperitoneal and pelvic soft tissue  Initiation of maintenance Rituxan every 3 months 10/13/2016 (2 year course planned)  CTs 08/25/2017- new left axillary lymph node, enlargement of left  pleural effusion, less masslike soft tissue in the mid retroperitoneum, new areas of retroperitoneal and pelvic lymphadenopathy.  Mildly enlarged neck lymph nodes  Cycle 1 bendamustine 09/09/2017 2. CT chest 04/01/2015 with no lymphadenopathy  CT abdomen/pelvis 05/01/2015 with mild progression of abdomen/pelvic lymphadenopathy 3. Prostate cancer - he has been treated with hormonal therapy, the PSA was stable on 10/15/2014  PSA increased 04/26/2015   Bone scan 05/09/2015 with unchanged increased activity in the thoracic and lumbar spine felt to most likely be degenerative.   Initiation of Abiraterone and prednisone 05/11/2015. Normalization of the PSA on Abiraterone. 4. History of a normocytic anemia secondary to non-Hodgkin's lymphoma, chemotherapy, and renal insufficiency - progressive secondary to GI bleeding, stable 5. History of Mild thrombocytopenia  6. History of bilateral hydronephrosis. Renal ultrasound 09/26/2014 consistent with medical renal disease. No hydronephrosis. Ureter stents removed 10/09/2016. 7. Coronary artery disease - status post coronary artery stent placement. 8. History of noncalcified lung nodules. 9. History of severe neutropenia July 2009 - likely related to delayed toxicity from rituximab. 10. Macular degeneration. 11. Right middle lobe density on a CT of the chest 06/18/2010 measuring 2.5 x 1.6 cm with mildly increased metabolic activity (SUV max 2.5) on a PET scan 06/27/2010. The area of increased density was less confluent and appeared smaller on a restaging CT 09/08/2010. Right middle lobe "scarring" with no new or suspicious pulmonary nodule on a CT 08/04/2011. 12. Mild increased metabolic activity associated with several lymph nodes on the PET scan 06/27/2010 - likely related to lymphoma. No lymphadenopathy in the mediastinum or axillary areas on the CT 08/04/2011. 13. Right hip discomfort-he received a steroid  injection by his primary physician without  improvement. He saw Dr. Collier Salina and there was a question of a lytic process in the right pelvis. A bone scan on 09/21/2012 revealed no evidence of metastatic disease. 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 Renal insufficiency-progressive CT 01/12/2016 confirmed progressive hydronephrosis  Placement of bilateral ureter stents 04/10/2016  Ureter stents removed 10/09/2016 15. Left leg DVT and pulmonary embolism 04/01/2015-on Coumadin 16. Admission 08/23/2015 with severe anemia and GI bleeding  Upper and lower endoscopy 08/26/2015 without a clear source for bleeding identified 17. Leg edema/anasarca-improved with leg wrapping and diuretics 18. Anemia secondary to chronic disease, renal failure, and possibly lymphoma 19. 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   Biopsy retroperitoneal soft tissue 05/13/2016 20. History of elevated calcium-most likely secondary to non-Hodgkin's lymphoma status post pamidronate 06/08/2016, calcitonin 06/11/2016     Disposition: Edward Woodward completed cycle 1 bendamustine beginning 09/09/2017.  His performance status appears improved.  The left cervical lymph node is not palpable on exam today.  We reviewed today's labs.  He has mild neutropenia.  He understands to contact the office with fever, chills, other signs of infection.    The PT/INR is in goal range.  He will continue the same dose of Coumadin.  He will return for labs, follow-up and cycle 2 bendamustine 10/06/2017.  He will contact the office in the interim with any problems.  Patient seen with Dr. Benay Woodward.    [name] ANP/GNP-BC   09/23/2017  11:25 AM  This was insured visit with [name].  Edward Woodward was interviewed and examined.  He tolerated the first cycle of bendamustine well.  His performance status has improved.  He will return for an office  visit and cycle 2 bendamustine on 10/06/2017.  [name], MD

## 2017-09-30 ENCOUNTER — Other Ambulatory Visit: Payer: Self-pay | Admitting: Oncology

## 2017-10-01 ENCOUNTER — Other Ambulatory Visit: Payer: Self-pay | Admitting: Oncology

## 2017-10-01 DIAGNOSIS — C61 Malignant neoplasm of prostate: Secondary | ICD-10-CM

## 2017-10-01 MED ORDER — PREDNISONE 5 MG PO TABS: 5.0000 mg | ORAL_TABLET | Freq: Every day | ORAL | 0 refills | Status: DC

## 2017-10-04 ENCOUNTER — Other Ambulatory Visit: Payer: Self-pay | Admitting: *Deleted

## 2017-10-04 ENCOUNTER — Other Ambulatory Visit: Payer: Self-pay | Admitting: Emergency Medicine

## 2017-10-04 DIAGNOSIS — C61 Malignant neoplasm of prostate: Secondary | ICD-10-CM

## 2017-10-04 MED ORDER — ABIRATERONE ACETATE 250 MG PO TABS: 1000.0000 mg | ORAL_TABLET | Freq: Every day | ORAL | 1 refills | Status: DC

## 2017-10-06 ENCOUNTER — Inpatient Hospital Stay: Payer: Medicare Other

## 2017-10-06 ENCOUNTER — Inpatient Hospital Stay: Payer: Medicare Other | Attending: Oncology

## 2017-10-06 ENCOUNTER — Inpatient Hospital Stay (HOSPITAL_BASED_OUTPATIENT_CLINIC_OR_DEPARTMENT_OTHER): Payer: Medicare Other | Admitting: Nurse Practitioner

## 2017-10-06 ENCOUNTER — Telehealth: Payer: Self-pay | Admitting: Nurse Practitioner

## 2017-10-06 VITALS — BP 136/60 | HR 65 | Temp 97.6°F | Resp 24 | Ht 70.0 in | Wt 185.2 lb

## 2017-10-06 DIAGNOSIS — Z5112 Encounter for antineoplastic immunotherapy: Secondary | ICD-10-CM | POA: Insufficient documentation

## 2017-10-06 DIAGNOSIS — Z79899 Other long term (current) drug therapy: Secondary | ICD-10-CM | POA: Diagnosis not present

## 2017-10-06 DIAGNOSIS — C8299 Follicular lymphoma, unspecified, extranodal and solid organ sites: Secondary | ICD-10-CM

## 2017-10-06 DIAGNOSIS — Z8546 Personal history of malignant neoplasm of prostate: Secondary | ICD-10-CM | POA: Insufficient documentation

## 2017-10-06 DIAGNOSIS — M542 Cervicalgia: Secondary | ICD-10-CM

## 2017-10-06 DIAGNOSIS — Z9221 Personal history of antineoplastic chemotherapy: Secondary | ICD-10-CM | POA: Insufficient documentation

## 2017-10-06 DIAGNOSIS — Z86718 Personal history of other venous thrombosis and embolism: Secondary | ICD-10-CM

## 2017-10-06 DIAGNOSIS — Z86711 Personal history of pulmonary embolism: Secondary | ICD-10-CM

## 2017-10-06 DIAGNOSIS — Z7901 Long term (current) use of anticoagulants: Secondary | ICD-10-CM

## 2017-10-06 DIAGNOSIS — C8589 Other specified types of non-Hodgkin lymphoma, extranodal and solid organ sites: Secondary | ICD-10-CM | POA: Diagnosis not present

## 2017-10-06 DIAGNOSIS — C8198 Hodgkin lymphoma, unspecified, lymph nodes of multiple sites: Secondary | ICD-10-CM

## 2017-10-06 LAB — CBC WITH DIFFERENTIAL (CANCER CENTER ONLY)
BASOS PCT: 0 %
Basophils Absolute: 0 10*3/uL (ref 0.0–0.1)
Eosinophils Absolute: 0.2 10*3/uL (ref 0.0–0.5)
Eosinophils Relative: 5 %
HEMATOCRIT: 33.7 % — AB (ref 38.4–49.9)
Hemoglobin: 11.2 g/dL — ABNORMAL LOW (ref 13.0–17.1)
Lymphocytes Relative: 36 %
Lymphs Abs: 1.2 10*3/uL (ref 0.9–3.3)
MCH: 29.9 pg (ref 27.2–33.4)
MCHC: 33.4 g/dL (ref 32.0–36.0)
MCV: 89.5 fL (ref 79.3–98.0)
Monocytes Absolute: 0.5 10*3/uL (ref 0.1–0.9)
Monocytes Relative: 16 %
NEUTROS ABS: 1.5 10*3/uL (ref 1.5–6.5)
Neutrophils Relative %: 43 %
Platelet Count: 138 10*3/uL — ABNORMAL LOW (ref 140–400)
RBC: 3.76 MIL/uL — ABNORMAL LOW (ref 4.20–5.82)
RDW: 15.9 % — AB (ref 11.0–14.6)
WBC Count: 3.4 10*3/uL — ABNORMAL LOW (ref 4.0–10.3)

## 2017-10-06 LAB — CMP (CANCER CENTER ONLY)
ALBUMIN: 3.2 g/dL — AB (ref 3.5–5.0)
ALK PHOS: 46 U/L (ref 40–150)
ALT: 8 U/L (ref 0–55)
ANION GAP: 6 (ref 3–11)
AST: 15 U/L (ref 5–34)
BUN: 27 mg/dL — ABNORMAL HIGH (ref 7–26)
CALCIUM: 9.7 mg/dL (ref 8.4–10.4)
CO2: 27 mmol/L (ref 22–29)
Chloride: 106 mmol/L (ref 98–109)
Creatinine: 1.36 mg/dL — ABNORMAL HIGH (ref 0.70–1.30)
GFR, Est AFR Am: 52 mL/min — ABNORMAL LOW (ref >60)
GFR, Estimated: 45 mL/min — ABNORMAL LOW (ref >60)
GLUCOSE: 103 mg/dL (ref 70–140)
Potassium: 4.6 mmol/L (ref 3.5–5.1)
SODIUM: 139 mmol/L (ref 136–145)
Total Bilirubin: 0.4 mg/dL (ref 0.2–1.2)
Total Protein: 6 g/dL — ABNORMAL LOW (ref 6.4–8.3)

## 2017-10-06 LAB — PROTIME-INR
INR: 1.42
Prothrombin Time: 17.2 seconds — ABNORMAL HIGH (ref 11.4–15.2)

## 2017-10-06 MED ORDER — DEXAMETHASONE SODIUM PHOSPHATE 10 MG/ML IJ SOLN
INTRAMUSCULAR | Status: AC
  Filled 2017-10-06: qty 1

## 2017-10-06 MED ORDER — DEXAMETHASONE SODIUM PHOSPHATE 10 MG/ML IJ SOLN
10.0000 mg | Freq: Once | INTRAMUSCULAR | Status: AC
  Administered 2017-10-06: 10 mg via INTRAVENOUS

## 2017-10-06 MED ORDER — SODIUM CHLORIDE 0.9 % IV SOLN
70.0000 mg/m2 | Freq: Once | INTRAVENOUS | Status: AC
  Administered 2017-10-06: 150 mg via INTRAVENOUS
  Filled 2017-10-06: qty 6

## 2017-10-06 MED ORDER — PALONOSETRON HCL INJECTION 0.25 MG/5ML
0.2500 mg | Freq: Once | INTRAVENOUS | Status: AC
  Administered 2017-10-06: 0.25 mg via INTRAVENOUS

## 2017-10-06 MED ORDER — SODIUM CHLORIDE 0.9 % IV SOLN
Freq: Once | INTRAVENOUS | Status: AC
  Administered 2017-10-06: 16:00:00 via INTRAVENOUS

## 2017-10-06 MED ORDER — PALONOSETRON HCL INJECTION 0.25 MG/5ML
INTRAVENOUS | Status: AC
Start: 2017-10-06 — End: ?
  Filled 2017-10-06: qty 5

## 2017-10-06 NOTE — Telephone Encounter (Signed)
Scheduled appt per 3/13 los - patient to get an updated schedule in the tx area.  

## 2017-10-06 NOTE — Progress Notes (Addendum)
[facility] OFFICE PROGRESS NOTE   Diagnosis: Non-Hodgkin's lymphoma, prostate cancer  INTERVAL HISTORY:   Edward Woodward returns as scheduled.  He completed cycle 1 bendamustine beginning 09/09/2017.  He denies nausea.  No diarrhea.  No fevers or sweats.  Good appetite.  Confusion continues to be improved overall though his wife noted he was confused earlier today.  His activity level has improved.  He complains of right-sided neck pain over the past several days.  His wife thinks the pain is related to an "awkward sleep position".  Objective:  Vital signs in last 24 hours:  Blood pressure 136/60, pulse 65, temperature 97.6 F (36.4 C), temperature source Oral, resp. rate (!) 24, height 5\' 10"  (1.778 m), weight 185 lb 3.2 oz (84 kg), SpO2 100 %.    HEENT: No thrush or ulcers. Lymphatics: No palpable cervical, supraclavicular or inguinal lymph nodes. Resp: Lungs clear bilaterally. Cardio: Regular rate and rhythm. GI: Abdomen soft and nontender.  No hepatosplenomegaly. Vascular: No leg edema. Neuro: Alert.  Follows commands.    Lab Results:  Lab Results  Component Value Date   WBC 3.4 (L) 10/06/2017   HGB 11.4 (L) 08/11/2017   HCT 33.7 (L) 10/06/2017   MCV 89.5 10/06/2017   PLT 138 (L) 10/06/2017   NEUTROABS 1.5 10/06/2017    Imaging:  No results found.  Medications: I have reviewed the patient's current medications.  Assessment/Plan: 1. Non-Hodgkin's lymphoma (follicular grade 3 lymphoma) - status post 6 cycles of Cytoxan/prednisone/rituximab 07/01/2007 through 10/21/2007.   Biopsy of a retroperitoneal mass 05/13/2016 consistent with involvement by low-grade non-Hodgkin's lymphoma  Initiation of weekly Rituxan 06/17/2016; week 4given 07/09/2016.  CT abdomen/pelvis 09/15/2016-improvement in retroperitoneal and pelvic soft tissue  Initiation of maintenance Rituxan every 3 months 10/13/2016 (2 year course planned)  CTs 08/25/2017- new left axillary  lymph node, enlargement of left pleural effusion, less masslike soft tissue in the mid retroperitoneum, new areas of retroperitoneal and pelvic lymphadenopathy. Mildlyenlarged neck lymph nodes  Cycle 1 bendamustine 09/09/2017  Cycle 2 bendamustine 10/06/2017 2. CT chest 04/01/2015 with no lymphadenopathy  CT abdomen/pelvis 05/01/2015 with mild progression of abdomen/pelvic lymphadenopathy 3. Prostate cancer - he has been treated with hormonal therapy, the PSA was stable on 10/15/2014  PSA increased 04/26/2015   Bone scan 05/09/2015 with unchanged increased activity in the thoracic and lumbar spine felt to most likely be degenerative.   Initiation of Abiraterone and prednisone 05/11/2015. Normalization of the PSA on Abiraterone. 4. History of a normocytic anemia secondary to non-Hodgkin's lymphoma, chemotherapy, and renal insufficiency - progressive secondary to GI bleeding, stable 5. History of Mild thrombocytopenia  6. History of bilateral hydronephrosis. Renal ultrasound 09/26/2014 consistent with medical renal disease. No hydronephrosis. Ureter stents removed 10/09/2016. 7. Coronary artery disease - status post coronary artery stent placement. 8. History of noncalcified lung nodules. 9. History of severe neutropenia July 2009 - likely related to delayed toxicity from rituximab. 10. Macular degeneration. 11. Right middle lobe density on a CT of the chest 06/18/2010 measuring 2.5 x 1.6 cm with mildly increased metabolic activity (SUV max 2.5) on a PET scan 06/27/2010. The area of increased density was less confluent and appeared smaller on a restaging CT 09/08/2010. Right middle lobe "scarring" with no new or suspicious pulmonary nodule on a CT 08/04/2011. 12. Mild increased metabolic activity associated with several lymph nodes on the PET scan 06/27/2010 - likely related to lymphoma. No lymphadenopathy in the mediastinum or axillary areas on the CT 08/04/2011. 13. Right  hip discomfort-he  received a steroid injection by his primary physician without improvement. He saw Dr. Collier Salina and there was a question of a lytic process in the right pelvis. A bone scan on 09/21/2012 revealed no evidence of metastatic disease. 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 Renal insufficiency-progressive CT 01/12/2016 confirmed progressive hydronephrosis  Placement of bilateral ureter stents 04/10/2016  Ureter stents removed 10/09/2016 15. Left leg DVT and pulmonary embolism 04/01/2015-on Coumadin 16. Admission 08/23/2015 with severe anemia and GI bleeding  Upper and lower endoscopy 08/26/2015 without a clear source for bleeding identified 17. Leg edema/anasarca-improved with leg wrapping and diuretics 18. Anemia secondary to chronic disease, renal failure, and possibly lymphoma 19. 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   Biopsy retroperitoneal soft tissue 05/13/2016 20. History of elevated calcium-most likely secondary to non-Hodgkin's lymphoma status post pamidronate 06/08/2016, calcitonin 06/11/2016     Disposition: Edward Woodward has completed 1 cycle of bendamustine.  His performance status continues to be improved and he no longer has palpable peripheral adenopathy.  Plan to proceed with cycle 2 bendamustine today as scheduled.  The right neck pain is likely due to a benign musculoskeletal condition.  He and his wife understand to contact the office if the pain worsens.  We will follow-up on the PT/INR from today and make adjustments to the Coumadin dose as indicated.  He will return for a CBC and PT/INR in 2 weeks.  He will return for lab, follow-up and cycle 3 bendamustine in 4 weeks.  He will contact the office in the interim with any problems.  Patient seen with Dr. Benay Spice.    [name] ANP/GNP-BC   10/06/2017  2:47 PM  This was a shared  visit with [name].  Edward Woodward was interviewed and examined.  The neck pain is likely secondary to a benign muscular skeletal condition.  His wife will contact us if the pain persist.  He has experienced overall clinical improvement with bendamustine.  He will begin another cycle today.  [name], MD

## 2017-10-06 NOTE — Patient Instructions (Signed)
[facility] Discharge Instructions for Patients Receiving Chemotherapy  Today you received the following chemotherapy agents Bendeka.  To help prevent nausea and vomiting after your treatment, we encourage you to take your nausea medication as directed.   If you develop nausea and vomiting that is not controlled by your nausea medication, call the clinic.   BELOW ARE SYMPTOMS THAT SHOULD BE REPORTED IMMEDIATELY:  *FEVER GREATER THAN 100.5 F  *CHILLS WITH OR WITHOUT FEVER  NAUSEA AND VOMITING THAT IS NOT CONTROLLED WITH YOUR NAUSEA MEDICATION  *UNUSUAL SHORTNESS OF BREATH  *UNUSUAL BRUISING OR BLEEDING  TENDERNESS IN MOUTH AND THROAT WITH OR WITHOUT PRESENCE OF ULCERS  *URINARY PROBLEMS  *BOWEL PROBLEMS  UNUSUAL RASH Items with * indicate a potential emergency and should be followed up as soon as possible.  Feel free to call the clinic should you have any questions or concerns. The clinic phone number is [phone].  Please show the CHEMO ALERT CARD at check-in to the Emergency Department and triage nurse.   

## 2017-10-07 ENCOUNTER — Inpatient Hospital Stay: Payer: Medicare Other

## 2017-10-07 VITALS — BP 137/60 | HR 65 | Temp 97.8°F | Resp 20

## 2017-10-07 DIAGNOSIS — C8589 Other specified types of non-Hodgkin lymphoma, extranodal and solid organ sites: Secondary | ICD-10-CM | POA: Diagnosis not present

## 2017-10-07 DIAGNOSIS — Z9221 Personal history of antineoplastic chemotherapy: Secondary | ICD-10-CM | POA: Diagnosis not present

## 2017-10-07 DIAGNOSIS — Z5112 Encounter for antineoplastic immunotherapy: Secondary | ICD-10-CM | POA: Diagnosis not present

## 2017-10-07 DIAGNOSIS — Z86718 Personal history of other venous thrombosis and embolism: Secondary | ICD-10-CM | POA: Diagnosis not present

## 2017-10-07 DIAGNOSIS — C8299 Follicular lymphoma, unspecified, extranodal and solid organ sites: Secondary | ICD-10-CM

## 2017-10-07 DIAGNOSIS — Z79899 Other long term (current) drug therapy: Secondary | ICD-10-CM | POA: Diagnosis not present

## 2017-10-07 DIAGNOSIS — Z8546 Personal history of malignant neoplasm of prostate: Secondary | ICD-10-CM | POA: Diagnosis not present

## 2017-10-07 MED ORDER — DEXAMETHASONE SODIUM PHOSPHATE 10 MG/ML IJ SOLN
INTRAMUSCULAR | Status: AC
  Filled 2017-10-07: qty 1

## 2017-10-07 MED ORDER — SODIUM CHLORIDE 0.9 % IV SOLN
70.0000 mg/m2 | Freq: Once | INTRAVENOUS | Status: AC
  Administered 2017-10-07: 150 mg via INTRAVENOUS
  Filled 2017-10-07: qty 6

## 2017-10-07 MED ORDER — SODIUM CHLORIDE 0.9 % IV SOLN
Freq: Once | INTRAVENOUS | Status: AC
  Administered 2017-10-07: 15:00:00 via INTRAVENOUS

## 2017-10-07 MED ORDER — DEXAMETHASONE SODIUM PHOSPHATE 10 MG/ML IJ SOLN
10.0000 mg | Freq: Once | INTRAMUSCULAR | Status: AC
  Administered 2017-10-07: 10 mg via INTRAVENOUS

## 2017-10-07 NOTE — Patient Instructions (Signed)
[facility] Discharge Instructions for Patients Receiving Chemotherapy  Today you received the following chemotherapy agents bendeka  To help prevent nausea and vomiting after your treatment, we encourage you to take your nausea medication as directed   If you develop nausea and vomiting that is not controlled by your nausea medication, call the clinic.   BELOW ARE SYMPTOMS THAT SHOULD BE REPORTED IMMEDIATELY:  *FEVER GREATER THAN 100.5 F  *CHILLS WITH OR WITHOUT FEVER  NAUSEA AND VOMITING THAT IS NOT CONTROLLED WITH YOUR NAUSEA MEDICATION  *UNUSUAL SHORTNESS OF BREATH  *UNUSUAL BRUISING OR BLEEDING  TENDERNESS IN MOUTH AND THROAT WITH OR WITHOUT PRESENCE OF ULCERS  *URINARY PROBLEMS  *BOWEL PROBLEMS  UNUSUAL RASH Items with * indicate a potential emergency and should be followed up as soon as possible.  Feel free to call the clinic you have any questions or concerns. The clinic phone number is [phone].  

## 2017-10-08 ENCOUNTER — Telehealth: Payer: Self-pay | Admitting: Emergency Medicine

## 2017-10-08 NOTE — Telephone Encounter (Addendum)
Pt wife verbalized understanding of this note.   ----- Message from Owens Shark, NP sent at 10/07/2017  4:32 PM EDT ----- Continue same dose of Coumadin.

## 2017-10-12 DIAGNOSIS — H53413 Scotoma involving central area, bilateral: Secondary | ICD-10-CM | POA: Diagnosis not present

## 2017-10-12 DIAGNOSIS — H53411 Scotoma involving central area, right eye: Secondary | ICD-10-CM | POA: Diagnosis not present

## 2017-10-13 ENCOUNTER — Encounter: Payer: Medicare Other | Admitting: Internal Medicine

## 2017-10-20 ENCOUNTER — Inpatient Hospital Stay: Payer: Medicare Other

## 2017-10-20 DIAGNOSIS — Z9221 Personal history of antineoplastic chemotherapy: Secondary | ICD-10-CM | POA: Diagnosis not present

## 2017-10-20 DIAGNOSIS — C8299 Follicular lymphoma, unspecified, extranodal and solid organ sites: Secondary | ICD-10-CM

## 2017-10-20 DIAGNOSIS — Z5112 Encounter for antineoplastic immunotherapy: Secondary | ICD-10-CM | POA: Diagnosis not present

## 2017-10-20 DIAGNOSIS — Z86718 Personal history of other venous thrombosis and embolism: Secondary | ICD-10-CM | POA: Diagnosis not present

## 2017-10-20 DIAGNOSIS — C8589 Other specified types of non-Hodgkin lymphoma, extranodal and solid organ sites: Secondary | ICD-10-CM | POA: Diagnosis not present

## 2017-10-20 DIAGNOSIS — Z79899 Other long term (current) drug therapy: Secondary | ICD-10-CM | POA: Diagnosis not present

## 2017-10-20 DIAGNOSIS — Z8546 Personal history of malignant neoplasm of prostate: Secondary | ICD-10-CM | POA: Diagnosis not present

## 2017-10-20 LAB — PROTIME-INR
INR: 1.37
PROTHROMBIN TIME: 16.7 s — AB (ref 11.4–15.2)

## 2017-10-20 LAB — CBC WITH DIFFERENTIAL (CANCER CENTER ONLY)
Basophils Absolute: 0 10*3/uL (ref 0.0–0.1)
Basophils Relative: 1 %
EOS ABS: 0.3 10*3/uL (ref 0.0–0.5)
Eosinophils Relative: 8 %
HEMATOCRIT: 33.1 % — AB (ref 38.4–49.9)
HEMOGLOBIN: 11 g/dL — AB (ref 13.0–17.1)
LYMPHS ABS: 0.6 10*3/uL — AB (ref 0.9–3.3)
LYMPHS PCT: 18 %
MCH: 30.2 pg (ref 27.2–33.4)
MCHC: 33.3 g/dL (ref 32.0–36.0)
MCV: 90.8 fL (ref 79.3–98.0)
MONOS PCT: 18 %
Monocytes Absolute: 0.6 10*3/uL (ref 0.1–0.9)
NEUTROS PCT: 55 %
Neutro Abs: 1.8 10*3/uL (ref 1.5–6.5)
Platelet Count: 140 10*3/uL (ref 140–400)
RBC: 3.64 MIL/uL — ABNORMAL LOW (ref 4.20–5.82)
RDW: 16.8 % — ABNORMAL HIGH (ref 11.0–14.6)
WBC: 3.3 10*3/uL — AB (ref 4.0–10.3)

## 2017-10-31 ENCOUNTER — Other Ambulatory Visit: Payer: Self-pay | Admitting: Oncology

## 2017-11-03 ENCOUNTER — Telehealth: Payer: Self-pay | Admitting: Oncology

## 2017-11-03 ENCOUNTER — Inpatient Hospital Stay: Payer: Medicare Other

## 2017-11-03 ENCOUNTER — Inpatient Hospital Stay: Payer: Medicare Other | Attending: Oncology | Admitting: Oncology

## 2017-11-03 VITALS — BP 129/64 | HR 65 | Temp 98.7°F | Resp 18 | Ht 70.0 in | Wt 182.1 lb

## 2017-11-03 DIAGNOSIS — Z79899 Other long term (current) drug therapy: Secondary | ICD-10-CM | POA: Diagnosis not present

## 2017-11-03 DIAGNOSIS — Z5112 Encounter for antineoplastic immunotherapy: Secondary | ICD-10-CM | POA: Diagnosis not present

## 2017-11-03 DIAGNOSIS — Z8546 Personal history of malignant neoplasm of prostate: Secondary | ICD-10-CM | POA: Insufficient documentation

## 2017-11-03 DIAGNOSIS — Z7901 Long term (current) use of anticoagulants: Secondary | ICD-10-CM | POA: Insufficient documentation

## 2017-11-03 DIAGNOSIS — Z86711 Personal history of pulmonary embolism: Secondary | ICD-10-CM | POA: Insufficient documentation

## 2017-11-03 DIAGNOSIS — C8589 Other specified types of non-Hodgkin lymphoma, extranodal and solid organ sites: Secondary | ICD-10-CM | POA: Insufficient documentation

## 2017-11-03 DIAGNOSIS — Z9221 Personal history of antineoplastic chemotherapy: Secondary | ICD-10-CM | POA: Diagnosis not present

## 2017-11-03 DIAGNOSIS — Z86718 Personal history of other venous thrombosis and embolism: Secondary | ICD-10-CM | POA: Diagnosis not present

## 2017-11-03 DIAGNOSIS — C8299 Follicular lymphoma, unspecified, extranodal and solid organ sites: Secondary | ICD-10-CM

## 2017-11-03 LAB — CBC WITH DIFFERENTIAL (CANCER CENTER ONLY)
BASOS ABS: 0 10*3/uL (ref 0.0–0.1)
Basophils Relative: 1 %
EOS ABS: 0.6 10*3/uL — AB (ref 0.0–0.5)
Eosinophils Relative: 12 %
HCT: 30.5 % — ABNORMAL LOW (ref 38.4–49.9)
Hemoglobin: 10.6 g/dL — ABNORMAL LOW (ref 13.0–17.1)
Lymphocytes Relative: 14 %
Lymphs Abs: 0.7 10*3/uL — ABNORMAL LOW (ref 0.9–3.3)
MCH: 31.2 pg (ref 27.2–33.4)
MCHC: 34.7 g/dL (ref 32.0–36.0)
MCV: 89.9 fL (ref 79.3–98.0)
MONO ABS: 0.6 10*3/uL (ref 0.1–0.9)
Monocytes Relative: 14 %
Neutro Abs: 2.8 10*3/uL (ref 1.5–6.5)
Neutrophils Relative %: 59 %
Platelet Count: 153 10*3/uL (ref 140–400)
RBC: 3.39 MIL/uL — AB (ref 4.20–5.82)
RDW: 16.5 % — AB (ref 11.0–14.6)
WBC: 4.7 10*3/uL (ref 4.0–10.3)

## 2017-11-03 LAB — CMP (CANCER CENTER ONLY)
ALT: 8 U/L (ref 0–55)
AST: 15 U/L (ref 5–34)
Albumin: 3 g/dL — ABNORMAL LOW (ref 3.5–5.0)
Alkaline Phosphatase: 59 U/L (ref 40–150)
Anion gap: 7 (ref 3–11)
BUN: 24 mg/dL (ref 7–26)
CHLORIDE: 108 mmol/L (ref 98–109)
CO2: 25 mmol/L (ref 22–29)
CREATININE: 1.31 mg/dL — AB (ref 0.70–1.30)
Calcium: 9.8 mg/dL (ref 8.4–10.4)
GFR, EST AFRICAN AMERICAN: 54 mL/min — AB (ref >60)
GFR, EST NON AFRICAN AMERICAN: 47 mL/min — AB (ref >60)
Glucose, Bld: 91 mg/dL (ref 70–140)
POTASSIUM: 4 mmol/L (ref 3.5–5.1)
SODIUM: 140 mmol/L (ref 136–145)
Total Bilirubin: 0.4 mg/dL (ref 0.2–1.2)
Total Protein: 6 g/dL — ABNORMAL LOW (ref 6.4–8.3)

## 2017-11-03 LAB — PROTIME-INR
INR: 1.42
Prothrombin Time: 17.2 seconds — ABNORMAL HIGH (ref 11.4–15.2)

## 2017-11-03 MED ORDER — DEXAMETHASONE SODIUM PHOSPHATE 10 MG/ML IJ SOLN
INTRAMUSCULAR | Status: AC
  Filled 2017-11-03: qty 1

## 2017-11-03 MED ORDER — PALONOSETRON HCL INJECTION 0.25 MG/5ML
INTRAVENOUS | Status: AC
  Filled 2017-11-03: qty 5

## 2017-11-03 NOTE — Progress Notes (Signed)
[facility] OFFICE PROGRESS NOTE   Diagnosis: Non-Hodgkin's lymphoma, prostate cancer  INTERVAL HISTORY:   Mr. Samuelson returns as scheduled.  He complains of malaise.  No fever or dyspnea.  The neck pain has improved.  He had an episode of confusion during the night on 11/01/2017.  His wife found him wet in the bathroom after taking a shower.  The floor was wet.  No fall.  Objective:  Vital signs in last 24 hours:  Blood pressure 129/64, pulse 65, temperature 98.7 F (37.1 C), temperature source Oral, resp. rate 18, height 5\' 10"  (1.778 m), weight 182 lb 1.6 oz (82.6 kg), SpO2 100 %.    HEENT: No thrush, healing ulceration at the right lower gumline Lymphatics: No cervical or supraclavicular nodes Resp: Lungs clear bilaterally Cardio: Regular rate and rhythm GI: No hepatosplenomegaly Vascular: No leg edema Neuro: Alert, follows commands, ambulates to the examination table with minimal assistance   Lab Results:  Lab Results  Component Value Date   WBC 4.7 11/03/2017   HGB 11.4 (L) 08/11/2017   HCT 30.5 (L) 11/03/2017   MCV 89.9 11/03/2017   PLT 153 11/03/2017   NEUTROABS 2.8 11/03/2017    CMP     Component Value Date/Time   NA 140 11/03/2017 1028   NA 140 07/08/2017 1345   NA 138 05/21/2017 1528   K 4.0 11/03/2017 1028   K 4.4 05/21/2017 1528   CL 108 11/03/2017 1028   CL 105 05/31/2012 1058   CO2 25 11/03/2017 1028   CO2 24 05/21/2017 1528   GLUCOSE 91 11/03/2017 1028   GLUCOSE 127 05/21/2017 1528   GLUCOSE 85 05/31/2012 1058   BUN 24 11/03/2017 1028   BUN 22 07/08/2017 1345   BUN 31.7 (H) 05/21/2017 1528   CREATININE 1.31 (H) 11/03/2017 1028   CREATININE 1.5 (H) 05/21/2017 1528   CALCIUM 9.8 11/03/2017 1028   CALCIUM 10.3 05/21/2017 1528   PROT 6.0 (L) 11/03/2017 1028   PROT 6.0 (L) 05/21/2017 1528   ALBUMIN 3.0 (L) 11/03/2017 1028   ALBUMIN 3.4 (L) 05/21/2017 1528   AST 15 11/03/2017 1028   AST 20 05/21/2017 1528   ALT 8 11/03/2017  1028   ALT 12 05/21/2017 1528   ALKPHOS 59 11/03/2017 1028   ALKPHOS 59 05/21/2017 1528   BILITOT 0.4 11/03/2017 1028   BILITOT 0.49 05/21/2017 1528   GFRNONAA 47 (L) 11/03/2017 1028   GFRAA 54 (L) 11/03/2017 1028    No results found for: CEA1  Lab Results  Component Value Date   INR 1.42 11/03/2017    Medications: I have reviewed the patient's current medications.   Assessment/Plan: 1. Non-Hodgkin's lymphoma (follicular grade 3 lymphoma) - status post 6 cycles of Cytoxan/prednisone/rituximab 07/01/2007 through 10/21/2007.   Biopsy of a retroperitoneal mass 05/13/2016 consistent with involvement by low-grade non-Hodgkin's lymphoma  Initiation of weekly Rituxan 06/17/2016; week 4given 07/09/2016.  CT abdomen/pelvis 09/15/2016-improvement in retroperitoneal and pelvic soft tissue  Initiation of maintenance Rituxan every 3 months 10/13/2016 (2 year course planned)  CTs 08/25/2017-new left axillary lymph node, enlargement of left pleural effusion, less masslike soft tissue in the mid retroperitoneum, new areas of retroperitoneal and pelvic lymphadenopathy. Mildlyenlarged neck lymph nodes  Cycle 1 bendamustine 09/09/2017  Cycle 2 bendamustine 10/06/2017 2. CT chest 04/01/2015 with no lymphadenopathy  CT abdomen/pelvis 05/01/2015 with mild progression of abdomen/pelvic lymphadenopathy 3. Prostate cancer - he has been treated with hormonal therapy, the PSA was stable on 10/15/2014  PSA increased 04/26/2015  Bone scan 05/09/2015 with unchanged increased activity in the thoracic and lumbar spine felt to most likely be degenerative.   Initiation of Abiraterone and prednisone 05/11/2015. Normalization of the PSA on Abiraterone. 4. History of a normocytic anemia secondary to non-Hodgkin's lymphoma, chemotherapy, and renal insufficiency - progressive secondary to GI bleeding, stable 5. History of Mild thrombocytopenia  6. History of bilateral hydronephrosis. Renal ultrasound  09/26/2014 consistent with medical renal disease. No hydronephrosis. Ureter stents removed 10/09/2016. 7. Coronary artery disease - status post coronary artery stent placement. 8. History of noncalcified lung nodules. 9. History of severe neutropenia July 2009 - likely related to delayed toxicity from rituximab. 10. Macular degeneration. 11. Right middle lobe density on a CT of the chest 06/18/2010 measuring 2.5 x 1.6 cm with mildly increased metabolic activity (SUV max 2.5) on a PET scan 06/27/2010. The area of increased density was less confluent and appeared smaller on a restaging CT 09/08/2010. Right middle lobe "scarring" with no new or suspicious pulmonary nodule on a CT 08/04/2011. 12. Mild increased metabolic activity associated with several lymph nodes on the PET scan 06/27/2010 - likely related to lymphoma. No lymphadenopathy in the mediastinum or axillary areas on the CT 08/04/2011. 13. Right hip discomfort-he received a steroid injection by his primary physician without improvement. He saw Dr. Collier Salina and there was a question of a lytic process in the right pelvis. A bone scan on 09/21/2012 revealed no evidence of metastatic disease. 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 Renal insufficiency-progressive CT 01/12/2016 confirmed progressive hydronephrosis  Placement of bilateral ureter stents 04/10/2016  Ureter stents removed 10/09/2016 15. Left leg DVT and pulmonary embolism 04/01/2015-on Coumadin 16. Admission 08/23/2015 with severe anemia and GI bleeding  Upper and lower endoscopy 08/26/2015 without a clear source for bleeding identified 17. Leg edema/anasarca-improved with leg wrapping and diuretics 18. Anemia secondary to chronic disease, renal failure, and possibly lymphoma 19. 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   Biopsy retroperitoneal soft  tissue 05/13/2016 20. History of elevated calcium-most likely secondary to non-Hodgkin's lymphoma status post pamidronate 06/08/2016, calcitonin 06/11/2016   Disposition: [name] has completed 2 cycles of salvage therapy with bendamustine.  Palpable lymphadenopathy has improved.  He continues to have generalized weakness and episodes of confusion.  I suspect he is developing "sundown".  We decided to hold treatment today.  He will return for office visit with the plan to deliver cycle 3 bendamustine in 1 week. The PT/INR is below goal range today.  We will check the PT when he returns next week and consider increasing the Coumadin dose.  25 minutes were spent with the patient today.  The majority of the time was used for counseling and coordination of care.  [name], MD  11/03/2017  11:46 AM

## 2017-11-03 NOTE — Telephone Encounter (Signed)
Scheduled appt per 4/10 los - Gave patient AVS and calender per los.  

## 2017-11-04 ENCOUNTER — Ambulatory Visit: Payer: Medicare Other

## 2017-11-07 ENCOUNTER — Other Ambulatory Visit: Payer: Self-pay | Admitting: Oncology

## 2017-11-08 ENCOUNTER — Ambulatory Visit (INDEPENDENT_AMBULATORY_CARE_PROVIDER_SITE_OTHER): Payer: Medicare Other | Admitting: Internal Medicine

## 2017-11-08 ENCOUNTER — Encounter: Payer: Medicare Other | Admitting: Internal Medicine

## 2017-11-08 ENCOUNTER — Encounter: Payer: Self-pay | Admitting: Internal Medicine

## 2017-11-08 ENCOUNTER — Telehealth: Payer: Self-pay | Admitting: Internal Medicine

## 2017-11-08 VITALS — BP 110/60 | HR 65 | Ht 70.0 in | Wt 181.0 lb

## 2017-11-08 DIAGNOSIS — Z45018 Encounter for adjustment and management of other part of cardiac pacemaker: Secondary | ICD-10-CM | POA: Diagnosis not present

## 2017-11-08 DIAGNOSIS — I495 Sick sinus syndrome: Secondary | ICD-10-CM | POA: Diagnosis not present

## 2017-11-08 DIAGNOSIS — I480 Paroxysmal atrial fibrillation: Secondary | ICD-10-CM

## 2017-11-08 NOTE — Patient Instructions (Addendum)
Medication Instructions:  Your physician has recommended you make the following change in your medication:   1. Stop isosorbide mononitrate (Imdur) 2. Stop metoprolol Succinat (Toprol XL)  Labwork: None ordered.  Testing/Procedures: Your physician has requested that you have an echocardiogram. Echocardiography is a painless test that uses sound waves to create images of your heart. It provides your doctor with information about the size and shape of your heart and how well your heart's chambers and valves are working. This procedure takes approximately one hour. There are no restrictions for this procedure.   Follow-Up: Your physician recommends that you schedule a follow-up appointment in:   One Year with Dr Caryl Comes  Follow up in 6-8 weeks with Dr Tamala Julian or one of his APP's to monitor BP medication changes  Remote monitoring is used to monitor your Pacemaker from home. This monitoring reduces the number of office visits required to check your device to one time per year. It allows [place] to keep an eye on the functioning of your device to ensure it is working properly. You are scheduled for a device check from home on 02/07/2018. You may send your transmission at any time that day. If you have a wireless device, the transmission will be sent automatically. After your physician reviews your transmission, you will receive a postcard with your next transmission date.    Any Other Special Instructions Will Be Listed Below (If Applicable).  Please increase your daily fluids. Your urine should be light yellow and clear. You should feel the urge to urinate every few hours. If it begins to darken or decrease in frequency, please increase your fluid intake  Please wear an abdominal binder. You can find these at most drug stores.    If you need a refill on your cardiac medications before your next appointment, please call your pharmacy.

## 2017-11-08 NOTE — Progress Notes (Signed)
Patient Care Team: Lajean Manes, MD as PCP - General (Internal Medicine) Ladell Pier, MD as Attending Physician (Hematology and Oncology) Myrlene Broker, MD as Attending Physician (Urology)   HPI  Edward Woodward is a 84 y.o. male Seen for pacemaker followup implanted remotely and elsewhere for sinus node dysfunction and presyncope. NO interval syncope  Device was at [facility] and underwent generator replacement 12/18.   He also has a history of coronary artery disease with mild left ventricular dysfunction with EF of 50% 2011.  He has no history of atrial fibrillation x as  documented time of his pneumothorax that was a consequence of a fall 12/14.  Hx of PEmboli; he has been off coumadin in the past without untowards  He is enduring his cancer and chemo  Ambulates with increasing difficulty.  Has had 2 episodes of near syncope in the shower.  Denies orthostasis routinely.  P.o. intake is decreased.  Has had episodes of shortness of breath.    Past Medical History:  Diagnosis Date  . AMD (age-related macular degeneration), bilateral   . Anemia   . Anginal pain ([place])    pts wife states pt had to use nitroglycerin this am 04/09/2016  . CAD (coronary artery disease)     NYHA Class 1B, CABG 1985  . CHF (congestive heart failure) ([place])   . Collagen vascular disease ([place])   . Collapsed lung    right   . Complication of anesthesia    pts wife states pt gets confused   . Degeneration of lumbar or lumbosacral intervertebral disc   . Depressive disorder, not elsewhere classified   . Dysrhythmia   . GERD (gastroesophageal reflux disease)   . History of blood transfusion   . History of hiatal hernia   . HTN (hypertension)   . Hypercholesteremia   . Lower leg edema    bilat  . Multiple falls   . Myocardial infarction ([place])   . Neuromuscular disorder (HCC)    neuropathy  . Neuropathy   . Non Hodgkin's lymphoma ([place])   . OSA on CPAP   . PE (pulmonary  embolism) 03/2015  . Presence of permanent cardiac pacemaker   . Prostate cancer ([place])   . Shortness of breath dyspnea    with exertion   . Sinoatrial node dysfunction (HCC)   . Ulcers of both lower legs (HCC)    history of   . Urinary incontinence     Past Surgical History:  Procedure Laterality Date  . APPENDECTOMY    . BACK SURGERY    . CARDIAC CATHETERIZATION N/A 08/27/2015   Procedure: Left Heart Cath and Cors/Grafts Angiography;  Surgeon: Belva Crome, MD;  Location: [place] [facility];  Service: Cardiovascular;  Laterality: N/A;  . COLONOSCOPY N/A 08/26/2015   Procedure: COLONOSCOPY;  Surgeon: Clarene Essex, MD;  Location: [facility] ENDOSCOPY;  Service: Endoscopy;  Laterality: N/A;  . CORONARY ANGIOPLASTY WITH STENT PLACEMENT     s/p bare metal stent implant SVG-diagonal 11/23/05, s/p BM stent implantation SVG diagonal 10/22/06 (feeds LAD), and 05/2010 with DES to left circumflex  . [place]   SVG to Diagonal/LAD,  seqSVG to OM1 and OM2  . CYSTOSCOPY W/ URETERAL STENT PLACEMENT Bilateral 04/10/2016   Procedure: CYSTOSCOPY WITH RETROGRADE PYELOGRAM/URETERAL STENT PLACEMENT;  Surgeon: Festus Aloe, MD;  Location: [facility];  Service: Urology;  Laterality: Bilateral;  . CYSTOSCOPY W/ URETERAL STENT  PLACEMENT Bilateral 10/09/2016   Procedure: CYSTOSCOPY WITH BILATERAL RETROGRADE PYELOGRAM [name]  URETERAL STENT REMOVAL;  Surgeon: Festus Aloe, MD;  Location: [facility];  Service: Urology;  Laterality: Bilateral;  . ENDARTERECTOMY     bilat   . ESOPHAGOGASTRODUODENOSCOPY (EGD) WITH PROPOFOL N/A 08/26/2015   Procedure: ESOPHAGOGASTRODUODENOSCOPY (EGD) WITH PROPOFOL;  Surgeon: Clarene Essex, MD;  Location: [facility] ENDOSCOPY;  Service: Endoscopy;  Laterality: N/A;  . EXPLORATORY LAPAROTOMY    . LUMBAR FUSION    . PACEMAKER INSERTION    . PPM GENERATOR CHANGEOUT N/A 07/09/2017   Procedure: PPM GENERATOR CHANGEOUT;  Surgeon: Deboraha Sprang, MD;  Location: [place] [facility];  Service: Cardiovascular;  Laterality: N/A;  . ROTATOR CUFF REPAIR     bilat  . TONSILLECTOMY      Current Outpatient Medications  Medication Sig Dispense Refill  . abiraterone acetate (ZYTIGA) 250 MG tablet Take 4 tablets (1,000 mg total) by mouth daily. Take on an empty stomach 1 hour before or 2 hours after a meal 120 tablet 1  . acetaminophen (TYLENOL) 500 MG tablet Take 1,000 mg by mouth every 6 (six) hours as needed for mild pain, moderate pain or headache.     . Chlorpheniramine-Acetaminophen (CORICIDIN HBP COLD/FLU PO) Take 2 tablets by mouth 2 (two) times daily.    . citalopram (CELEXA) 20 MG tablet Take 10 mg by mouth daily.     . clonazePAM (KLONOPIN) 0.25 MG disintegrating tablet Take 1 tablet (0.25 mg total) by mouth 2 (two) times daily. (Patient taking differently: Take 0.125 mg by mouth 2 (two) times daily. ) 60 tablet 5  . hydrocortisone 2.5 % ointment Apply 1 application topically 2 (two) times daily as needed. Applied to affected area(s) of skin( pressure sores)    . isosorbide mononitrate (IMDUR) 30 MG 24 hr tablet Take 1 tablet (30 mg total) by mouth at bedtime. 90 tablet 2  . Melatonin 3 MG TABS Take 3 mg by mouth at bedtime.    . metoprolol succinate (TOPROL-XL) 50 MG 24 hr tablet Take 1/2 tablet by mouth daily with or immediately following a meal. 45 tablet 3  . Multiple Vitamins-Minerals (ICAPS AREDS 2 PO) Take 2 tablets by mouth every morning.    . nitroGLYCERIN (NITROLINGUAL) 0.4 MG/SPRAY spray Place 1 spray under the tongue every 5 (five) minutes x 3 doses as needed for chest pain. 12 g 3  . ondansetron (ZOFRAN) 4 MG tablet Take 1 tablet (4 mg total) by mouth every 8 (eight) hours as needed for nausea or vomiting. 20 tablet 0  . Polyethyl Glycol-Propyl Glycol (SYSTANE) 0.4-0.3 % SOLN Place 1 drop into both eyes at bedtime.     . polyethylene glycol (MIRALAX / GLYCOLAX) packet Take 17 g by mouth daily as needed.    . predniSONE (DELTASONE) 5 MG tablet  Take 1 tablet (5 mg total) by mouth daily. 90 tablet 0  . torsemide (DEMADEX) 20 MG tablet Take 1 tablet (20 mg total) by mouth daily. 90 tablet 3  . triamcinolone ointment (KENALOG) 0.1 % Apply 1 application topically See admin instructions. Use once to twice daily applied to affected area(s) of skin.    Marland Kitchen vitamin B-12 (CYANOCOBALAMIN) 1000 MCG tablet Take 1,000 mcg by mouth at bedtime.     Marland Kitchen warfarin (COUMADIN) 5 MG tablet Take 1 tablet (5 mg total) by mouth daily at 6 PM. Except every MWF take half tablet (2.5 mg) 30 tablet 1   No current facility-administered medications for this  visit.     Allergies  Allergen Reactions  . Penicillins Hives, Shortness Of Breath and Other (See Comments)    Has patient had a PCN reaction causing immediate rash, facial/tongue/throat swelling, SOB or lightheadedness with hypotension: Yes Has patient had a PCN reaction causing severe rash involving mucus membranes or skin necrosis: No Has patient had a PCN reaction that required hospitalization No Has patient had a PCN reaction occurring within the last 10 years: No If all of the above answers are "NO", then may proceed with Cephalosporin use.   . Simvastatin Other (See Comments)    Reaction:  Muscle weakness    Review of Systems negative except from HPI and PMH  BP 110/60   Pulse 65   Ht 5\' 10"  (1.778 m)   Wt 181 lb (82.1 kg)   SpO2 98%   BMI 25.97 kg/m  Well developed and nourished in no acute distress HENT normal Neck supple with JVP-flat Clear Regular rate and rhythm, no murmurs or gallops Abd-soft with active BS No Clubbing cyanosis edema Skin-warm and dry A & Oriented  Grossly normal sensory and motor function  ECG a pacing at 65 Interval 17/11/43   Assessment and Plan  Sinus node dysfunction  Atrial pacer dependence   Atrial fibrillation   Coronary artery disease  Hypertension   Orthostatic Hypotension  Hx of PE and DVT on coumadin   Pacemaker-[place]  Scientific  Lymphoma non-curative/palliative intent per oncology note  Without symptoms of ischemia  I am not sure the mechanism is shortness of breath.  There is no venous distention today.  We will look at LV function by echo.  He has symptomatic orthostatic intolerance.  I suspect this is the cause of his episodes in the shower.  We discussed the importance of not falling and using his shower chair "religiously".  We also talked about the use of an abdominal binder and thigh sleeves to try to mitigate orthostasis otherwise.  His blood pressure is borderline low.  We will discontinue his isosorbide and his metoprolol at this time.\  Have encouraged him to increase his fluid intake    More than 50% of 40 min was spent in counseling related to the above

## 2017-11-08 NOTE — Telephone Encounter (Signed)
New Message:     Pt is due here for an appt at 10:45 but pt's wife states he is very weak and tired. Pt's wife wants to know if she should take him to the ER.

## 2017-11-08 NOTE — Telephone Encounter (Signed)
Spoke with patient's wife who states that he has been very weak and fatigued for the past several days. She states that he had an appointment with Dr. Caryl Comes this morning at 10:45 AM so the patient got up and took a shower and felt tired so he wanted to lay down and take a nap. It is now 10:45 AM and they haven't left he house yet. Wife states that she feels like he needs to be seen but they will not make the appointment in time. She states that in addition to feeling weak and fatigued he has been slightly SOB. She states that the patient has not been taking his torsemide because he is not having any swelling. She states that the patient also started having diarrhea yesterday. Called and spoke to Dr. Olin Pia RN who states that Dr. Caryl Comes will see the patient this afternoon at 2:30 PM. Made wife aware. Wife very appreciative and states that they will be here this afternoon. Appointment re-scheduled for this afternoon at 2:30 PM.

## 2017-11-09 LAB — CUP PACEART INCLINIC DEVICE CHECK
Implantable Lead Implant Date: 20081013
Implantable Lead Model: 4457
Implantable Lead Serial Number: 205344
Implantable Lead Serial Number: 210110
MDC IDC LEAD IMPLANT DT: 20081013
MDC IDC LEAD LOCATION: 753859
MDC IDC LEAD LOCATION: 753860
MDC IDC PG IMPLANT DT: 20181214
MDC IDC PG SERIAL: 377798
MDC IDC SESS DTM: 20190416153732

## 2017-11-10 ENCOUNTER — Telehealth: Payer: Self-pay | Admitting: Internal Medicine

## 2017-11-10 NOTE — Telephone Encounter (Signed)
New message  Pt wife verbalized that she is returning call for RN  Not sure of the call

## 2017-11-10 NOTE — Telephone Encounter (Signed)
Spoke with pt's wife, DPR on file.  Advised I could not find who had called today.  Checked with device team and they had not called.  Advised wife unsure of who called and just see if they call back.  Wife appreciative for call.

## 2017-11-11 ENCOUNTER — Inpatient Hospital Stay (HOSPITAL_BASED_OUTPATIENT_CLINIC_OR_DEPARTMENT_OTHER): Payer: Medicare Other | Admitting: Nurse Practitioner

## 2017-11-11 ENCOUNTER — Telehealth: Payer: Self-pay | Admitting: Nurse Practitioner

## 2017-11-11 ENCOUNTER — Inpatient Hospital Stay: Payer: Medicare Other

## 2017-11-11 ENCOUNTER — Encounter: Payer: Self-pay | Admitting: Nurse Practitioner

## 2017-11-11 VITALS — BP 120/58 | HR 65 | Temp 97.7°F | Resp 17 | Ht 70.0 in | Wt 179.5 lb

## 2017-11-11 DIAGNOSIS — Z79899 Other long term (current) drug therapy: Secondary | ICD-10-CM | POA: Diagnosis not present

## 2017-11-11 DIAGNOSIS — C8299 Follicular lymphoma, unspecified, extranodal and solid organ sites: Secondary | ICD-10-CM

## 2017-11-11 DIAGNOSIS — Z86711 Personal history of pulmonary embolism: Secondary | ICD-10-CM | POA: Diagnosis not present

## 2017-11-11 DIAGNOSIS — Z8546 Personal history of malignant neoplasm of prostate: Secondary | ICD-10-CM | POA: Diagnosis not present

## 2017-11-11 DIAGNOSIS — Z5112 Encounter for antineoplastic immunotherapy: Secondary | ICD-10-CM | POA: Diagnosis not present

## 2017-11-11 DIAGNOSIS — C8589 Other specified types of non-Hodgkin lymphoma, extranodal and solid organ sites: Secondary | ICD-10-CM

## 2017-11-11 DIAGNOSIS — Z7901 Long term (current) use of anticoagulants: Secondary | ICD-10-CM | POA: Diagnosis not present

## 2017-11-11 DIAGNOSIS — Z86718 Personal history of other venous thrombosis and embolism: Secondary | ICD-10-CM

## 2017-11-11 DIAGNOSIS — Z9221 Personal history of antineoplastic chemotherapy: Secondary | ICD-10-CM | POA: Diagnosis not present

## 2017-11-11 LAB — CBC WITH DIFFERENTIAL (CANCER CENTER ONLY)
BASOS ABS: 0 10*3/uL (ref 0.0–0.1)
Basophils Relative: 0 %
EOS ABS: 0.4 10*3/uL (ref 0.0–0.5)
EOS PCT: 8 %
HCT: 32.3 % — ABNORMAL LOW (ref 38.4–49.9)
Hemoglobin: 10.9 g/dL — ABNORMAL LOW (ref 13.0–17.1)
LYMPHS PCT: 21 %
Lymphs Abs: 1.1 10*3/uL (ref 0.9–3.3)
MCH: 30.8 pg (ref 27.2–33.4)
MCHC: 33.7 g/dL (ref 32.0–36.0)
MCV: 91.2 fL (ref 79.3–98.0)
Monocytes Absolute: 0.3 10*3/uL (ref 0.1–0.9)
Monocytes Relative: 7 %
Neutro Abs: 3.1 10*3/uL (ref 1.5–6.5)
Neutrophils Relative %: 64 %
PLATELETS: 184 10*3/uL (ref 140–400)
RBC: 3.54 MIL/uL — AB (ref 4.20–5.82)
RDW: 15.3 % — ABNORMAL HIGH (ref 11.0–14.6)
WBC: 4.9 10*3/uL (ref 4.0–10.3)

## 2017-11-11 LAB — PROTIME-INR
INR: 1.42
PROTHROMBIN TIME: 17.3 s — AB (ref 11.4–15.2)

## 2017-11-11 MED ORDER — PALONOSETRON HCL INJECTION 0.25 MG/5ML
0.2500 mg | Freq: Once | INTRAVENOUS | Status: AC
  Administered 2017-11-11: 0.25 mg via INTRAVENOUS

## 2017-11-11 MED ORDER — PALONOSETRON HCL INJECTION 0.25 MG/5ML
INTRAVENOUS | Status: AC
  Filled 2017-11-11: qty 5

## 2017-11-11 MED ORDER — SODIUM CHLORIDE 0.9 % IV SOLN
Freq: Once | INTRAVENOUS | Status: AC
  Administered 2017-11-11: 15:00:00 via INTRAVENOUS

## 2017-11-11 MED ORDER — DEXAMETHASONE SODIUM PHOSPHATE 10 MG/ML IJ SOLN
INTRAMUSCULAR | Status: AC
  Filled 2017-11-11: qty 1

## 2017-11-11 MED ORDER — DEXAMETHASONE SODIUM PHOSPHATE 10 MG/ML IJ SOLN
10.0000 mg | Freq: Once | INTRAMUSCULAR | Status: AC
  Administered 2017-11-11: 10 mg via INTRAVENOUS

## 2017-11-11 MED ORDER — SODIUM CHLORIDE 0.9 % IV SOLN
70.0000 mg/m2 | Freq: Once | INTRAVENOUS | Status: AC
  Administered 2017-11-11: 150 mg via INTRAVENOUS
  Filled 2017-11-11: qty 6

## 2017-11-11 NOTE — Telephone Encounter (Signed)
Scheduled appt per 4/18 los- Sent reminder letter with appt date and time. Central radiology to contact patient with ct scan.

## 2017-11-11 NOTE — Progress Notes (Addendum)
[place] [facility] OFFICE PROGRESS NOTE   Diagnosis: Non-Hodgkin's lymphoma, prostate cancer  INTERVAL HISTORY:   Edward Woodward returns as scheduled.  He continues to feel weak.  His wife thinks he is stronger.  Metoprolol and Imdur were discontinued earlier this week.  He denies fever.  Appetite is decreased.  Objective:  Vital signs in last 24 hours:  Blood pressure (!) 120/58, pulse 65, temperature 97.7 F (36.5 C), temperature source Oral, resp. rate 17, height 5\' 10"  (1.778 m), weight 179 lb 8 oz (81.4 kg), SpO2 98 %.    HEENT: No thrush or ulcers. Lymphatics: No palpable cervical or supraclavicular lymph nodes. Resp: Lungs clear bilaterally. Cardio: Regular rate and rhythm. GI: Abdomen soft and nontender.  No hepatosplenomegaly. Vascular: Trace pretibial edema bilaterally. Neuro: Alert.  Follows commands.    Lab Results:  Lab Results  Component Value Date   WBC 4.9 11/11/2017   HGB 11.4 (L) 08/11/2017   HCT 32.3 (L) 11/11/2017   MCV 91.2 11/11/2017   PLT 184 11/11/2017   NEUTROABS 3.1 11/11/2017    Imaging:  No results found.  Medications: I have reviewed the patient's current medications.  Assessment/Plan: 1. Non-Hodgkin's lymphoma (follicular grade 3 lymphoma) - status post 6 cycles of Cytoxan/prednisone/rituximab 07/01/2007 through 10/21/2007.   Biopsy of a retroperitoneal mass 05/13/2016 consistent with involvement by low-grade non-Hodgkin's lymphoma  Initiation of weekly Rituxan 06/17/2016; week 4given 07/09/2016.  CT abdomen/pelvis 09/15/2016-improvement in retroperitoneal and pelvic soft tissue  Initiation of maintenance Rituxan every 3 months 10/13/2016 (2 year course planned)  CTs 08/25/2017-new left axillary lymph node, enlargement of left pleural effusion, less masslike soft tissue in the mid retroperitoneum, new areas of retroperitoneal and pelvic lymphadenopathy. Mildlyenlarged neck lymph nodes  Cycle 1 bendamustine  09/09/2017  Cycle 2 bendamustine 10/06/2017  Cycle 3 bendamustine 11/11/2017 2. CT chest 04/01/2015 with no lymphadenopathy  CT abdomen/pelvis 05/01/2015 with mild progression of abdomen/pelvic lymphadenopathy 3. Prostate cancer - he has been treated with hormonal therapy, the PSA was stable on 10/15/2014  PSA increased 04/26/2015   Bone scan 05/09/2015 with unchanged increased activity in the thoracic and lumbar spine felt to most likely be degenerative.   Initiation of Abiraterone and prednisone 05/11/2015. Normalization of the PSA on Abiraterone. 4. History of a normocytic anemia secondary to non-Hodgkin's lymphoma, chemotherapy, and renal insufficiency - progressive secondary to GI bleeding, stable 5. History of Mild thrombocytopenia  6. History of bilateral hydronephrosis. Renal ultrasound 09/26/2014 consistent with medical renal disease. No hydronephrosis. Ureter stents removed 10/09/2016. 7. Coronary artery disease - status post coronary artery stent placement. 8. History of noncalcified lung nodules. 9. History of severe neutropenia July 2009 - likely related to delayed toxicity from rituximab. 10. Macular degeneration. 11. Right middle lobe density on a CT of the chest 06/18/2010 measuring 2.5 x 1.6 cm with mildly increased metabolic activity (SUV max 2.5) on a PET scan 06/27/2010. The area of increased density was less confluent and appeared smaller on a restaging CT 09/08/2010. Right middle lobe "scarring" with no new or suspicious pulmonary nodule on a CT 08/04/2011. 12. Mild increased metabolic activity associated with several lymph nodes on the PET scan 06/27/2010 - likely related to lymphoma. No lymphadenopathy in the mediastinum or axillary areas on the CT 08/04/2011. 13. Right hip discomfort-he received a steroid injection by his primary physician without improvement. He saw Dr. Collier Salina and there was a question of a lytic process in the right pelvis. A bone scan on  09/21/2012 revealed no evidence  of metastatic disease. 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 Renal insufficiency-progressive CT 01/12/2016 confirmed progressive hydronephrosis  Placement of bilateral ureter stents 04/10/2016  Ureter stents removed 10/09/2016 15. Left leg DVT and pulmonary embolism 04/01/2015-on Coumadin 16. Admission 08/23/2015 with severe anemia and GI bleeding  Upper and lower endoscopy 08/26/2015 without a clear source for bleeding identified 17. Leg edema/anasarca-improved with leg wrapping and diuretics 18. Anemia secondary to chronic disease, renal failure, and possibly lymphoma 19. 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   Biopsy retroperitoneal soft tissue 05/13/2016 20. History of elevated calcium-most likely secondary to non-Hodgkin's lymphoma status post pamidronate 06/08/2016, calcitonin 06/11/2016     Disposition: Mr. Mcewan appears unchanged.  He has completed 2 cycles of bendamustine.  There has been improvement in the palpable lymphadenopathy.  We decided to proceed with cycle 3 bendamustine today as scheduled.  He will undergo restaging CTs in approximately 3 weeks.  We will see him in follow-up in 4 weeks.  The PT/INR remains below goal range.  He has a history of a GI bleed and is also a fall risk.  He will continue Coumadin at the current dose.  Patient seen with Dr. Benay Spice.    [name] ANP/GNP-BC   11/11/2017  2:19 PM This was a shared visit with [name].  Mr. Callanan appears stable.  We discussed the options of proceeding with bendamustine versus discontinuing therapy.  He would like to complete another cycle of bendamustine.  He will undergo restaging CTs after this cycle.  [name], MD

## 2017-11-11 NOTE — Progress Notes (Signed)
Ok to treat with CMP from 11/03/17 per [name], NP

## 2017-11-11 NOTE — Patient Instructions (Addendum)
[facility] Discharge Instructions for Patients Receiving Chemotherapy  Today you received the following chemotherapy agents bendeka  To help prevent nausea and vomiting after your treatment, we encourage you to take your nausea medication as directed   If you develop nausea and vomiting that is not controlled by your nausea medication, call the clinic.   BELOW ARE SYMPTOMS THAT SHOULD BE REPORTED IMMEDIATELY:  *FEVER GREATER THAN 100.5 F  *CHILLS WITH OR WITHOUT FEVER  NAUSEA AND VOMITING THAT IS NOT CONTROLLED WITH YOUR NAUSEA MEDICATION  *UNUSUAL SHORTNESS OF BREATH  *UNUSUAL BRUISING OR BLEEDING  TENDERNESS IN MOUTH AND THROAT WITH OR WITHOUT PRESENCE OF ULCERS  *URINARY PROBLEMS  *BOWEL PROBLEMS  UNUSUAL RASH Items with * indicate a potential emergency and should be followed up as soon as possible.  Feel free to call the clinic you have any questions or concerns. The clinic phone number is [phone].  

## 2017-11-12 ENCOUNTER — Inpatient Hospital Stay: Payer: Medicare Other

## 2017-11-12 VITALS — BP 143/82 | HR 65 | Temp 97.7°F | Resp 18

## 2017-11-12 DIAGNOSIS — Z79899 Other long term (current) drug therapy: Secondary | ICD-10-CM | POA: Diagnosis not present

## 2017-11-12 DIAGNOSIS — Z9221 Personal history of antineoplastic chemotherapy: Secondary | ICD-10-CM | POA: Diagnosis not present

## 2017-11-12 DIAGNOSIS — C8299 Follicular lymphoma, unspecified, extranodal and solid organ sites: Secondary | ICD-10-CM

## 2017-11-12 DIAGNOSIS — Z8546 Personal history of malignant neoplasm of prostate: Secondary | ICD-10-CM | POA: Diagnosis not present

## 2017-11-12 DIAGNOSIS — Z86718 Personal history of other venous thrombosis and embolism: Secondary | ICD-10-CM | POA: Diagnosis not present

## 2017-11-12 DIAGNOSIS — C8589 Other specified types of non-Hodgkin lymphoma, extranodal and solid organ sites: Secondary | ICD-10-CM | POA: Diagnosis not present

## 2017-11-12 DIAGNOSIS — Z5112 Encounter for antineoplastic immunotherapy: Secondary | ICD-10-CM | POA: Diagnosis not present

## 2017-11-12 MED ORDER — SODIUM CHLORIDE 0.9 % IV SOLN
Freq: Once | INTRAVENOUS | Status: AC
  Administered 2017-11-12: 11:00:00 via INTRAVENOUS

## 2017-11-12 MED ORDER — DEXAMETHASONE SODIUM PHOSPHATE 10 MG/ML IJ SOLN
INTRAMUSCULAR | Status: AC
  Filled 2017-11-12: qty 1

## 2017-11-12 MED ORDER — DEXAMETHASONE SODIUM PHOSPHATE 10 MG/ML IJ SOLN
10.0000 mg | Freq: Once | INTRAMUSCULAR | Status: AC
  Administered 2017-11-12: 10 mg via INTRAVENOUS

## 2017-11-12 MED ORDER — SODIUM CHLORIDE 0.9 % IV SOLN
70.0000 mg/m2 | Freq: Once | INTRAVENOUS | Status: AC
  Administered 2017-11-12: 150 mg via INTRAVENOUS
  Filled 2017-11-12: qty 6

## 2017-11-12 NOTE — Patient Instructions (Signed)
[facility] Discharge Instructions for Patients Receiving Chemotherapy  Today you received the following chemotherapy agents bendeka  To help prevent nausea and vomiting after your treatment, we encourage you to take your nausea medication as directed   If you develop nausea and vomiting that is not controlled by your nausea medication, call the clinic.   BELOW ARE SYMPTOMS THAT SHOULD BE REPORTED IMMEDIATELY:  *FEVER GREATER THAN 100.5 F  *CHILLS WITH OR WITHOUT FEVER  NAUSEA AND VOMITING THAT IS NOT CONTROLLED WITH YOUR NAUSEA MEDICATION  *UNUSUAL SHORTNESS OF BREATH  *UNUSUAL BRUISING OR BLEEDING  TENDERNESS IN MOUTH AND THROAT WITH OR WITHOUT PRESENCE OF ULCERS  *URINARY PROBLEMS  *BOWEL PROBLEMS  UNUSUAL RASH Items with * indicate a potential emergency and should be followed up as soon as possible.  Feel free to call the clinic you have any questions or concerns. The clinic phone number is [phone].  

## 2017-11-15 ENCOUNTER — Other Ambulatory Visit: Payer: Self-pay

## 2017-11-15 ENCOUNTER — Ambulatory Visit (HOSPITAL_COMMUNITY): Payer: Medicare Other | Attending: Internal Medicine

## 2017-11-15 DIAGNOSIS — I495 Sick sinus syndrome: Secondary | ICD-10-CM | POA: Insufficient documentation

## 2017-11-15 DIAGNOSIS — I119 Hypertensive heart disease without heart failure: Secondary | ICD-10-CM | POA: Insufficient documentation

## 2017-11-15 DIAGNOSIS — I480 Paroxysmal atrial fibrillation: Secondary | ICD-10-CM | POA: Diagnosis not present

## 2017-11-15 DIAGNOSIS — Z45018 Encounter for adjustment and management of other part of cardiac pacemaker: Secondary | ICD-10-CM | POA: Insufficient documentation

## 2017-11-15 DIAGNOSIS — I2510 Atherosclerotic heart disease of native coronary artery without angina pectoris: Secondary | ICD-10-CM | POA: Diagnosis not present

## 2017-11-15 DIAGNOSIS — I4891 Unspecified atrial fibrillation: Secondary | ICD-10-CM | POA: Diagnosis not present

## 2017-11-15 DIAGNOSIS — E785 Hyperlipidemia, unspecified: Secondary | ICD-10-CM | POA: Insufficient documentation

## 2017-11-15 DIAGNOSIS — I77810 Thoracic aortic ectasia: Secondary | ICD-10-CM | POA: Insufficient documentation

## 2017-11-17 ENCOUNTER — Encounter: Payer: Self-pay | Admitting: Oncology

## 2017-11-22 ENCOUNTER — Other Ambulatory Visit: Payer: Self-pay

## 2017-11-22 MED ORDER — WARFARIN SODIUM 5 MG PO TABS: 5.0000 mg | ORAL_TABLET | Freq: Every day | ORAL | 1 refills | Status: DC

## 2017-11-23 ENCOUNTER — Telehealth: Payer: Self-pay | Admitting: *Deleted

## 2017-11-23 ENCOUNTER — Other Ambulatory Visit: Payer: Self-pay | Admitting: Oncology

## 2017-11-23 DIAGNOSIS — C61 Malignant neoplasm of prostate: Secondary | ICD-10-CM

## 2017-11-23 NOTE — Telephone Encounter (Addendum)
E-scribed Zytiga at 1125.

## 2017-11-23 NOTE — Telephone Encounter (Signed)
Voicemail forwarded to collaborative.  "Erin with [facility]armacy with request for Zytiga refill.  Fax order to [phone] or call [phone]  [phone] ext. [phone] "

## 2017-12-02 ENCOUNTER — Ambulatory Visit (HOSPITAL_COMMUNITY)
Admission: RE | Admit: 2017-12-02 | Discharge: 2017-12-02 | Disposition: A | Discharge status: Home / Self Care | Payer: Medicare Other | Source: Ambulatory Visit | Attending: Nurse Practitioner | Admitting: Nurse Practitioner

## 2017-12-02 DIAGNOSIS — R918 Other nonspecific abnormal finding of lung field: Secondary | ICD-10-CM | POA: Diagnosis not present

## 2017-12-02 DIAGNOSIS — I700 Atherosclerosis of aorta: Secondary | ICD-10-CM | POA: Diagnosis not present

## 2017-12-02 DIAGNOSIS — R590 Localized enlarged lymph nodes: Secondary | ICD-10-CM | POA: Diagnosis not present

## 2017-12-02 DIAGNOSIS — C8299 Follicular lymphoma, unspecified, extranodal and solid organ sites: Secondary | ICD-10-CM

## 2017-12-02 DIAGNOSIS — K8020 Calculus of gallbladder without cholecystitis without obstruction: Secondary | ICD-10-CM | POA: Diagnosis not present

## 2017-12-09 ENCOUNTER — Inpatient Hospital Stay: Payer: Medicare Other

## 2017-12-09 ENCOUNTER — Inpatient Hospital Stay: Payer: Medicare Other | Attending: Oncology | Admitting: Oncology

## 2017-12-09 VITALS — BP 122/57 | HR 69 | Temp 97.6°F | Resp 17 | Ht 70.0 in | Wt 180.8 lb

## 2017-12-09 DIAGNOSIS — Z79899 Other long term (current) drug therapy: Secondary | ICD-10-CM | POA: Insufficient documentation

## 2017-12-09 DIAGNOSIS — Z86711 Personal history of pulmonary embolism: Secondary | ICD-10-CM | POA: Diagnosis not present

## 2017-12-09 DIAGNOSIS — C8299 Follicular lymphoma, unspecified, extranodal and solid organ sites: Secondary | ICD-10-CM

## 2017-12-09 DIAGNOSIS — Z7901 Long term (current) use of anticoagulants: Secondary | ICD-10-CM | POA: Insufficient documentation

## 2017-12-09 DIAGNOSIS — C61 Malignant neoplasm of prostate: Secondary | ICD-10-CM

## 2017-12-09 DIAGNOSIS — Z86718 Personal history of other venous thrombosis and embolism: Secondary | ICD-10-CM | POA: Insufficient documentation

## 2017-12-09 DIAGNOSIS — C8589 Other specified types of non-Hodgkin lymphoma, extranodal and solid organ sites: Secondary | ICD-10-CM | POA: Diagnosis not present

## 2017-12-09 DIAGNOSIS — Z8546 Personal history of malignant neoplasm of prostate: Secondary | ICD-10-CM | POA: Diagnosis not present

## 2017-12-09 DIAGNOSIS — Z9221 Personal history of antineoplastic chemotherapy: Secondary | ICD-10-CM | POA: Diagnosis not present

## 2017-12-09 LAB — CMP (CANCER CENTER ONLY)
ALK PHOS: 63 U/L (ref 40–150)
ALT: 12 U/L (ref 0–55)
ANION GAP: 6 (ref 3–11)
AST: 19 U/L (ref 5–34)
Albumin: 3.4 g/dL — ABNORMAL LOW (ref 3.5–5.0)
BILIRUBIN TOTAL: 0.4 mg/dL (ref 0.2–1.2)
BUN: 22 mg/dL (ref 7–26)
CALCIUM: 9.4 mg/dL (ref 8.4–10.4)
CO2: 26 mmol/L (ref 22–29)
Chloride: 105 mmol/L (ref 98–109)
Creatinine: 1.38 mg/dL — ABNORMAL HIGH (ref 0.70–1.30)
GFR, EST NON AFRICAN AMERICAN: 44 mL/min — AB (ref >60)
GFR, Est AFR Am: 51 mL/min — ABNORMAL LOW (ref >60)
Glucose, Bld: 118 mg/dL (ref 70–140)
POTASSIUM: 4 mmol/L (ref 3.5–5.1)
Sodium: 137 mmol/L (ref 136–145)
TOTAL PROTEIN: 6.2 g/dL — AB (ref 6.4–8.3)

## 2017-12-09 LAB — CBC WITH DIFFERENTIAL (CANCER CENTER ONLY)
BASOS ABS: 0 10*3/uL (ref 0.0–0.1)
BASOS PCT: 1 %
Eosinophils Absolute: 0.4 10*3/uL (ref 0.0–0.5)
Eosinophils Relative: 9 %
HEMATOCRIT: 30.7 % — AB (ref 38.4–49.9)
HEMOGLOBIN: 10.4 g/dL — AB (ref 13.0–17.1)
Lymphocytes Relative: 19 %
Lymphs Abs: 0.8 10*3/uL — ABNORMAL LOW (ref 0.9–3.3)
MCH: 31.5 pg (ref 27.2–33.4)
MCHC: 33.9 g/dL (ref 32.0–36.0)
MCV: 92.9 fL (ref 79.3–98.0)
MONO ABS: 0.6 10*3/uL (ref 0.1–0.9)
Monocytes Relative: 15 %
NEUTROS ABS: 2.3 10*3/uL (ref 1.5–6.5)
NEUTROS PCT: 56 %
Platelet Count: 126 10*3/uL — ABNORMAL LOW (ref 140–400)
RBC: 3.3 MIL/uL — AB (ref 4.20–5.82)
RDW: 16.6 % — AB (ref 11.0–14.6)
WBC: 4.1 10*3/uL (ref 4.0–10.3)

## 2017-12-09 LAB — PROTIME-INR
INR: 1.88
PROTHROMBIN TIME: 21.4 s — AB (ref 11.4–15.2)

## 2017-12-09 NOTE — Progress Notes (Signed)
[place] OFFICE PROGRESS NOTE   Diagnosis: Non-Hodgkin's lymphoma, prostate cancer  INTERVAL HISTORY:   Edward Woodward returns as scheduled.  He has persistent malaise.  He complains of urinary frequency and nocturia.  He has developed bedwetting.  He is not taking medication for prostatic hypertrophy.  He has difficulty ambulating.   Objective:  Vital signs in last 24 hours:  Blood pressure (!) 122/57, pulse 69, temperature 97.6 F (36.4 C), temperature source Oral, resp. rate 17, height 5\' 10"  (1.778 m), weight 180 lb 12.8 oz (82 kg), SpO2 100 %.    Resp: Lungs clear bilaterally Cardio: Regular rate and rhythm GI: No hepatosplenomegaly, no mass Vascular: No leg edema   Portacath/PICC-without erythema  Lab Results:  Lab Results  Component Value Date   WBC 4.1 12/09/2017   HGB 10.4 (L) 12/09/2017   HCT 30.7 (L) 12/09/2017   MCV 92.9 12/09/2017   PLT 126 (L) 12/09/2017   NEUTROABS 2.3 12/09/2017    CMP     Component Value Date/Time   NA 137 12/09/2017 1027   NA 140 07/08/2017 1345   NA 138 05/21/2017 1528   K 4.0 12/09/2017 1027   K 4.4 05/21/2017 1528   CL 105 12/09/2017 1027   CL 105 05/31/2012 1058   CO2 26 12/09/2017 1027   CO2 24 05/21/2017 1528   GLUCOSE 118 12/09/2017 1027   GLUCOSE 127 05/21/2017 1528   GLUCOSE 85 05/31/2012 1058   BUN 22 12/09/2017 1027   BUN 22 07/08/2017 1345   BUN 31.7 (H) 05/21/2017 1528   CREATININE 1.38 (H) 12/09/2017 1027   CREATININE 1.5 (H) 05/21/2017 1528   CALCIUM 9.4 12/09/2017 1027   CALCIUM 10.3 05/21/2017 1528   PROT 6.2 (L) 12/09/2017 1027   PROT 6.0 (L) 05/21/2017 1528   ALBUMIN 3.4 (L) 12/09/2017 1027   ALBUMIN 3.4 (L) 05/21/2017 1528   AST 19 12/09/2017 1027   AST 20 05/21/2017 1528   ALT 12 12/09/2017 1027   ALT 12 05/21/2017 1528   ALKPHOS 63 12/09/2017 1027   ALKPHOS 59 05/21/2017 1528   BILITOT 0.4 12/09/2017 1027   BILITOT 0.49 05/21/2017 1528   GFRNONAA 44 (L) 12/09/2017 1027   GFRAA 51 (L) 12/09/2017 1027     Lab Results  Component Value Date   INR 1.88 12/09/2017    Imaging:  As per HPI, 12/02/2017 CT images reviewed Medications: I have reviewed the patient's current medications.   Assessment/Plan: 1. Non-Hodgkin's lymphoma (follicular grade 3 lymphoma) - status post 6 cycles of Cytoxan/prednisone/rituximab 07/01/2007 through 10/21/2007.   Biopsy of a retroperitoneal mass 05/13/2016 consistent with involvement by low-grade non-Hodgkin's lymphoma  Initiation of weekly Rituxan 06/17/2016; week 4given 07/09/2016.  CT abdomen/pelvis 09/15/2016-improvement in retroperitoneal and pelvic soft tissue  Initiation of maintenance Rituxan every 3 months 10/13/2016 (2 year course planned)  CTs 08/25/2017-new left axillary lymph node, enlargement of left pleural effusion, less masslike soft tissue in the mid retroperitoneum, new areas of retroperitoneal and pelvic lymphadenopathy. Mildlyenlarged neck lymph nodes  Cycle 1 bendamustine 09/09/2017  Cycle 2 bendamustine 10/06/2017  Cycle 3 bendamustine 11/11/2017  CTs 12/02/2017- resolution of left axillary adenopathy, decreased celiac adenopathy, unchanged infiltrative retroperitoneal adenopathy, improvement in pelvic sidewall and right inguinal adenopathy 2. CT chest 04/01/2015 with no lymphadenopathy  CT abdomen/pelvis 05/01/2015 with mild progression of abdomen/pelvic lymphadenopathy 3. Prostate cancer - he has been treated with hormonal therapy, the PSA was stable on 10/15/2014  PSA increased 04/26/2015   Bone scan 05/09/2015 with  unchanged increased activity in the thoracic and lumbar spine felt to most likely be degenerative.   Initiation of Abiraterone and prednisone 05/11/2015. Normalization of the PSA on Abiraterone. 4. History of a normocytic anemia secondary to non-Hodgkin's lymphoma, chemotherapy, and renal insufficiency - progressive secondary to GI bleeding, stable 5. History of Mild  thrombocytopenia  6. History of bilateral hydronephrosis. Renal ultrasound 09/26/2014 consistent with medical renal disease. No hydronephrosis. Ureter stents removed 10/09/2016. 7. Coronary artery disease - status post coronary artery stent placement. 8. History of noncalcified lung nodules. 9. History of severe neutropenia July 2009 - likely related to delayed toxicity from rituximab. 10. Macular degeneration. 11. Right middle lobe density on a CT of the chest 06/18/2010 measuring 2.5 x 1.6 cm with mildly increased metabolic activity (SUV max 2.5) on a PET scan 06/27/2010. The area of increased density was less confluent and appeared smaller on a restaging CT 09/08/2010. Right middle lobe "scarring" with no new or suspicious pulmonary nodule on a CT 08/04/2011. 12. Mild increased metabolic activity associated with several lymph nodes on the PET scan 06/27/2010 - likely related to lymphoma. No lymphadenopathy in the mediastinum or axillary areas on the CT 08/04/2011. 13. Right hip discomfort-he received a steroid injection by his primary physician without improvement. He saw Dr. Collier Salina and there was a question of a lytic process in the right pelvis. A bone scan on 09/21/2012 revealed no evidence of metastatic disease. 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 Renal insufficiency-progressive CT 01/12/2016 confirmed progressive hydronephrosis  Placement of bilateral ureter stents 04/10/2016  Ureter stents removed 10/09/2016 15. Left leg DVT and pulmonary embolism 04/01/2015-on Coumadin 16. Admission 08/23/2015 with severe anemia and GI bleeding  Upper and lower endoscopy 08/26/2015 without a clear source for bleeding identified 17. Leg edema/anasarca-improved with leg wrapping and diuretics 18. Anemia secondary to chronic disease, renal failure, and possibly lymphoma 19. 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   Biopsy retroperitoneal soft tissue 05/13/2016 20. History of elevated calcium-most likely secondary to non-Hodgkin's lymphoma status post pamidronate 06/08/2016, calcitonin 06/11/2016   Disposition: Edward Woodward has completed 3 cycles of salvage therapy with bendamustine.  The restaging CTs reveal significant improvement in the measurable lymphadenopathy.  However his overall performance status is declining.  This is most likely multifactorial including his age and underlying cardiovascular disease.  The PSA has been slightly higher over recent months, but there is no clinical or x-ray evidence for progression of prostate cancer.  I recommend placing chemotherapy on hold.  Edward Woodward and his wife are in agreement. He will begin a trial of Flomax for the urinary frequency.  Edward Woodward will continue Coumadin at the current dose.  He will return for an office and lab visit in 1 month.  He will contact us in the interim as needed.  25 minutes were spent with the patient today.  The majority of the time was used for counseling and coordination of care.  [name], MD  12/09/2017  11:05 AM

## 2017-12-10 ENCOUNTER — Other Ambulatory Visit: Payer: Self-pay | Admitting: Emergency Medicine

## 2017-12-10 MED ORDER — TAMSULOSIN HCL 0.4 MG PO CAPS: 0.4000 mg | ORAL_CAPSULE | Freq: Every day | ORAL | 0 refills | Status: DC

## 2017-12-27 ENCOUNTER — Encounter: Payer: Self-pay | Admitting: Nurse Practitioner

## 2017-12-27 ENCOUNTER — Ambulatory Visit (INDEPENDENT_AMBULATORY_CARE_PROVIDER_SITE_OTHER): Payer: Medicare Other | Admitting: Nurse Practitioner

## 2017-12-27 VITALS — BP 146/70 | HR 68 | Ht 70.0 in | Wt 183.4 lb

## 2017-12-27 DIAGNOSIS — Z950 Presence of cardiac pacemaker: Secondary | ICD-10-CM | POA: Diagnosis not present

## 2017-12-27 DIAGNOSIS — I10 Essential (primary) hypertension: Secondary | ICD-10-CM

## 2017-12-27 NOTE — Patient Instructions (Addendum)
We will be checking the following labs today - NONE   Medication Instructions:    Continue with your current medicines.     Testing/Procedures To Be Arranged:  N/A  Follow-Up:   See Dr. Tamala Julian as planned    Other Special Instructions:   Try to check your BP a few times a week and record.     If you need a refill on your cardiac medications before your next appointment, please call your pharmacy.   Call the [place] office at [phone] if you have any questions, problems or concerns.

## 2017-12-27 NOTE — Progress Notes (Signed)
CARDIOLOGY OFFICE NOTE  Date:  12/27/2017    [name] Date of Birth: [birth year] Medical Record #[id number]  PCP:  Lajean Manes, MD  Cardiologist:  Smith/Klein   Chief Complaint  Patient presents with  . Congestive Heart Failure    History of Present Illness: Edward Woodward is a 84 y.o. male who presents today for a follow up visit. Seen for Dr. Danella Maiers.   He has a history of chronic systolic/diastolic heart failure, CAD with remote CABG and prior PCIs, underlying PPM, AF, HTN,  history of pulmonary emboli, chronic venous insufficiency with peripheral edema, and obstructive sleep apnea.  Last seen in November by Dr. Tamala Julian. Was here with his wife. Confusion seemed to be the predominant theme.    Last seen by Dr. Caryl Comes in April - his device was changed out back in December. Some low BP noted - Imdur and Toprol were stopped. Noting more issues with ambulation/dementia. He remains on anticoagulation for prior PE. He remains on chemo for his lymphoma/prostate cancer.   Comes in today. Here with his wife. She gives most of the history. Seems to be doing ok. She feels like he has gotten a little stronger with the change in his chemo for his lymphoma. They admit that they have not been checking the BP - only two times over the past two days - one reading was 185/80 and yesterday was 145/80. He says he is not dizzy. Ambulating with the walker. Very unsteady. No chest pain. Echo with normal EF. He had a hard time articulating to me that his pacemaker seemed pronounced.    Past Medical History:  Diagnosis Date  . AMD (age-related macular degeneration), bilateral   . Anemia   . Anginal pain ([place])    pts wife states pt had to use nitroglycerin this am 04/09/2016  . CAD (coronary artery disease)     NYHA Class 1B, CABG 1985  . CHF (congestive heart failure) ([place])   . Collagen vascular disease ([place])   . Collapsed lung    right   . Complication of anesthesia    pts wife  states pt gets confused   . Degeneration of lumbar or lumbosacral intervertebral disc   . Depressive disorder, not elsewhere classified   . Dysrhythmia   . GERD (gastroesophageal reflux disease)   . History of blood transfusion   . History of hiatal hernia   . HTN (hypertension)   . Hypercholesteremia   . Lower leg edema    bilat  . Multiple falls   . Myocardial infarction ([place])   . Neuromuscular disorder (H[facility])    neuropathy  . Neuropathy   . Non Hodgkin's lymphoma ([place])   . OSA on CPAP   . PE (pulmonary embolism) 03/2015  . Presence of permanent cardiac pacemaker   . Prostate cancer ([place])   . Shortness of breath dyspnea    with exertion   . Sinoatrial node dysfunction (H[facility])   . Ulcers of both lower legs (H[facility])    history of   . Urinary incontinence     Past Surgical History:  Procedure Laterality Date  . APPENDECTOMY    . BACK SURGERY    . CARDIAC CATHETERIZATION N/A 08/27/2015   Procedure: Left Heart Cath and Cors/Grafts Angiography;  Surgeon: Belva Crome, MD;  Location: [facility];  Service: Cardiovascular;  Laterality: N/A;  . COLONOSCOPY N/A 08/26/2015   Procedure: COLONOSCOPY;  Surgeon: Clarene Essex, MD;  Location: [facility];  Service: Endoscopy;  Laterality: N/A;  . CORONARY ANGIOPLASTY WITH STENT PLACEMENT     s/p bare metal stent implant SVG-diagonal 11/23/05, s/p BM stent implantation SVG diagonal 10/22/06 (feeds LAD), and 05/2010 with DES to left circumflex  . [place]   SVG to Diagonal/LAD,  seqSVG to OM1 and OM2  . CYSTOSCOPY W/ URETERAL STENT PLACEMENT Bilateral 04/10/2016   Procedure: CYSTOSCOPY WITH RETROGRADE PYELOGRAM/URETERAL STENT PLACEMENT;  Surgeon: Festus Aloe, MD;  Location: [facility];  Service: Urology;  Laterality: Bilateral;  . CYSTOSCOPY W/ URETERAL STENT PLACEMENT Bilateral 10/09/2016   Procedure: CYSTOSCOPY WITH BILATERAL RETROGRADE PYELOGRAM [name]  URETERAL STENT REMOVAL;  Surgeon: Festus Aloe, MD;  Location: [facility];  Service: Urology;  Laterality: Bilateral;  . ENDARTERECTOMY     bilat   . ESOPHAGOGASTRODUODENOSCOPY (EGD) WITH PROPOFOL N/A 08/26/2015   Procedure: ESOPHAGOGASTRODUODENOSCOPY (EGD) WITH PROPOFOL;  Surgeon: Clarene Essex, MD;  [facility];  Service: Endoscopy;  Laterality: N/A;  . EXPLORATORY LAPAROTOMY    . LUMBAR FUSION    . PACEMAKER INSERTION    . PPM GENERATOR CHANGEOUT N/A 07/09/2017   Procedure: PPM GENERATOR CHANGEOUT;  Surgeon: Deboraha Sprang, MD;  Location: [facility];  Service: Cardiovascular;  Laterality: N/A;  . ROTATOR CUFF REPAIR     bilat  . TONSILLECTOMY       Medications: Current Meds  Medication Sig  . acetaminophen (TYLENOL) 500 MG tablet Take 1,000 mg by mouth every 6 (six) hours as needed for mild pain, moderate pain or headache.   . citalopram (CELEXA) 20 MG tablet Take 10 mg by mouth daily.   . clonazePAM (KLONOPIN) 0.25 MG disintegrating tablet Take 0.125 mg by mouth 2 (two) times daily. For restless legs  . hydrocortisone 2.5 % ointment Apply 1 application topically 2 (two) times daily as needed. Applied to affected area(s) of skin( pressure sores)  . Melatonin 3 MG TABS Take 3 mg by mouth at bedtime.  . nitroGLYCERIN (NITROLINGUAL) 0.4 MG/SPRAY spray Place 1 spray under the tongue every 5 (five) minutes x 3 doses as needed for chest pain.  Marland Kitchen ondansetron (ZOFRAN) 4 MG tablet Take 1 tablet (4 mg total) by mouth every 8 (eight) hours as needed for nausea or vomiting.  Vladimir Faster Glycol-Propyl Glycol (SYSTANE) 0.4-0.3 % SOLN Place 1 drop into both eyes at bedtime.   . polyethylene glycol (MIRALAX / GLYCOLAX) packet Take 17 g by mouth daily as needed for moderate constipation.   . predniSONE (DELTASONE) 5 MG tablet Take 1 tablet (5 mg total) by mouth daily.  . tamsulosin (FLOMAX) 0.4 MG CAPS capsule Take 1 capsule (0.4 mg total) by mouth at bedtime.  . torsemide (DEMADEX) 20 MG tablet Take 20 mg by mouth as needed  (for swelling (edema)).  Marland Kitchen triamcinolone ointment (KENALOG) 0.1 % Apply 1 application topically See admin instructions. Use once to twice daily applied to affected area(s) of skin.  Marland Kitchen vitamin B-12 (CYANOCOBALAMIN) 1000 MCG tablet Take 1,000 mcg by mouth at bedtime.   Marland Kitchen warfarin (COUMADIN) 5 MG tablet Take 1 tablet (5 mg total) by mouth daily at 6 PM. Except every MWF take half tablet (2.5 mg)  . ZYTIGA 250 MG tablet TAKE 4 TABLETS BY MOUTH ONCE DAILY AS DIRECTED.  TAKE 1 HOUR BEFORE OR 2 HOURS AFTER A MEAL     Allergies: Allergies  Allergen Reactions  . Penicillins Hives, Shortness Of Breath and Other (See Comments)  Has patient had a PCN reaction causing immediate rash, facial/tongue/throat swelling, SOB or lightheadedness with hypotension: Yes Has patient had a PCN reaction causing severe rash involving mucus membranes or skin necrosis: No Has patient had a PCN reaction that required hospitalization No Has patient had a PCN reaction occurring within the last 10 years: No If all of the above answers are "NO", then may proceed with Cephalosporin use.   . Simvastatin Other (See Comments)    Reaction:  Muscle weakness    Social History: The patient  reports that he quit smoking about 62 years ago. His smoking use included cigarettes. He has a 2.50 pack-year smoking history. He has never used smokeless tobacco. He reports that he does not drink alcohol or use drugs.   Family History: The patient's family history includes Cancer in his mother; Heart disease in his brother; Heart disease (age of onset: 50) in his father.   Review of Systems: Please see the history of present illness.   Otherwise, the review of systems is positive for none.   All other systems are reviewed and negative.   Physical Exam: VS:  BP (!) 146/70 (BP Location: Left Arm, Patient Position: Sitting, Cuff Size: Normal)   Pulse 68   Ht 5\' 10"  (1.778 m)   Wt 183 lb 6.4 oz (83.2 kg)   SpO2 100% Comment: at rest   BMI 26.32 kg/m  .  BMI Body mass index is 26.32 kg/m.  Wt Readings from Last 3 Encounters:  12/27/17 183 lb 6.4 oz (83.2 kg)  12/09/17 180 lb 12.8 oz (82 kg)  11/11/17 179 lb 8 oz (81.4 kg)    General: Chronically ill. Alert and in no acute distress. Seems frail to me.   HEENT: Normal.  Neck: Supple, no JVD, carotid bruits, or masses noted.  Cardiac: Regular rate and rhythm. Harsh outflow murmur. No edema.  Respiratory:  Lungs are clear to auscultation bilaterally with normal work of breathing.  GI: Soft and nontender.  MS: No deformity or atrophy. Gait and ROM intact. Using a walker. Seems unsteady.  Skin: Warm and dry. Color is normal.  Neuro:  Strength and sensation are intact and no gross focal deficits noted.  Psych: Alert, appropriate and with normal affect.   LABORATORY DATA:  EKG:  EKG is not ordered today.  Lab Results  Component Value Date   WBC 4.1 12/09/2017   HGB 10.4 (L) 12/09/2017   HCT 30.7 (L) 12/09/2017   PLT 126 (L) 12/09/2017   GLUCOSE 118 12/09/2017   CHOL 139 08/24/2015   TRIG 134 08/24/2015   HDL 32 (L) 08/24/2015   LDLCALC 80 08/24/2015   ALT 12 12/09/2017   AST 19 12/09/2017   NA 137 12/09/2017   K 4.0 12/09/2017   CL 105 12/09/2017   CREATININE 1.38 (H) 12/09/2017   BUN 22 12/09/2017   CO2 26 12/09/2017   TSH 0.73 03/06/2016   PSA 1.07 09/03/2015   INR 1.88 12/09/2017   HGBA1C 5.2 08/24/2015     BNP (last 3 results) No results for input(s): BNP in the last 8760 hours.  ProBNP (last 3 results) No results for input(s): PROBNP in the last 8760 hours.   Other Studies Reviewed Today:  Echo Study Conclusions 10/2017  - Left ventricle: The cavity size was normal. Wall thickness was   increased in a pattern of mild LVH. Systolic function was normal.   The estimated ejection fraction was in the range of 55% to 60%.  Wall motion was normal; there were no regional wall motion   abnormalities. Doppler parameters are consistent with  abnormal   left ventricular relaxation (grade 1 diastolic dysfunction). The   E/e&' ratio is between 8-15, suggesting indeterminate LV filling   pressure. - Aortic valve: Sclerosis without stenosis. There was trivial   regurgitation. - Aorta: Aortic root dimension: 40 mm (ED). Ascending aortic   diameter: 40 mm (S). - Ascending aorta: The ascending aorta was mildly dilated. - Mitral valve: Calcified annulus. Mildly thickened leaflets .   There was trivial regurgitation. - Left atrium: The atrium was mildly dilated. - Tricuspid valve: There was trivial regurgitation. - Pulmonary arteries: PA peak pressure: 26 mm Hg (S). - Inferior vena cava: The vessel was normal in size. The   respirophasic diameter changes were in the normal range (>= 50%),   consistent with normal central venous pressure.  Impressions:  - Compared to a prior study in 2017, the LVEF is slightly lower but   normal at 55-60%.   Head CT April 2018: IMPRESSION: 1. No acute intracranial hemorrhage. 2. Moderate age-related atrophy and chronic microvascular ischemic changes. If symptoms persist, and there are no contraindications, MRI may provide better evaluation if clinically indicated. 3. Paranasal sinus disease.  Assessment/Plan:  1. Prior low BP/orthostasis - seems improved. BP here today satisfactory. Has not really been checking his BP for any trends to be noted - would favor trying to check a few times a week and keep a diary. I have left his current regimen as is.   2. CAD - no active chest pain - would favor conservative management.   3. Lymphoma/Prostate Ca - on active treatment  4. Underlying PPM  5. Advanced age/confusion  Current medicines are reviewed with the patient today.  The patient does not have concerns regarding medicines other than what has been noted above.  The following changes have been made:  See above.  Labs/ tests ordered today include:   No orders of the defined types  were placed in this encounter.    Disposition:   FU with Dr. Tamala Julian as planned in September.   Patient is agreeable to this plan and will call if any problems develop in the interim.   SignedTruitt Merle, NP  12/27/2017 4:20 PM  [facility] [address] [place], [place]  [address] Phone: [phone] Fax: [phone]

## 2017-12-31 ENCOUNTER — Other Ambulatory Visit: Payer: Self-pay | Admitting: Emergency Medicine

## 2017-12-31 DIAGNOSIS — L821 Other seborrheic keratosis: Secondary | ICD-10-CM | POA: Diagnosis not present

## 2017-12-31 DIAGNOSIS — C61 Malignant neoplasm of prostate: Secondary | ICD-10-CM

## 2017-12-31 DIAGNOSIS — D692 Other nonthrombocytopenic purpura: Secondary | ICD-10-CM | POA: Diagnosis not present

## 2017-12-31 DIAGNOSIS — L570 Actinic keratosis: Secondary | ICD-10-CM | POA: Diagnosis not present

## 2017-12-31 DIAGNOSIS — Z85828 Personal history of other malignant neoplasm of skin: Secondary | ICD-10-CM | POA: Diagnosis not present

## 2017-12-31 MED ORDER — WARFARIN SODIUM 5 MG PO TABS: 5.0000 mg | ORAL_TABLET | Freq: Every day | ORAL | 1 refills | Status: DC

## 2017-12-31 MED ORDER — PREDNISONE 5 MG PO TABS: 5.0000 mg | ORAL_TABLET | Freq: Every day | ORAL | 0 refills | Status: DC

## 2018-01-06 ENCOUNTER — Encounter: Payer: Self-pay | Admitting: Nurse Practitioner

## 2018-01-06 ENCOUNTER — Inpatient Hospital Stay: Payer: Medicare Other

## 2018-01-06 ENCOUNTER — Inpatient Hospital Stay: Payer: Medicare Other | Attending: Oncology | Admitting: Nurse Practitioner

## 2018-01-06 ENCOUNTER — Telehealth: Payer: Self-pay

## 2018-01-06 VITALS — BP 124/59 | HR 65 | Temp 97.7°F | Resp 18 | Ht 70.0 in | Wt 185.3 lb

## 2018-01-06 DIAGNOSIS — C61 Malignant neoplasm of prostate: Secondary | ICD-10-CM

## 2018-01-06 DIAGNOSIS — C8299 Follicular lymphoma, unspecified, extranodal and solid organ sites: Secondary | ICD-10-CM

## 2018-01-06 DIAGNOSIS — Z9221 Personal history of antineoplastic chemotherapy: Secondary | ICD-10-CM | POA: Insufficient documentation

## 2018-01-06 DIAGNOSIS — Z86718 Personal history of other venous thrombosis and embolism: Secondary | ICD-10-CM | POA: Diagnosis not present

## 2018-01-06 DIAGNOSIS — I2699 Other pulmonary embolism without acute cor pulmonale: Secondary | ICD-10-CM | POA: Diagnosis not present

## 2018-01-06 DIAGNOSIS — Z7901 Long term (current) use of anticoagulants: Secondary | ICD-10-CM

## 2018-01-06 DIAGNOSIS — Z79899 Other long term (current) drug therapy: Secondary | ICD-10-CM | POA: Insufficient documentation

## 2018-01-06 DIAGNOSIS — Z8546 Personal history of malignant neoplasm of prostate: Secondary | ICD-10-CM | POA: Insufficient documentation

## 2018-01-06 DIAGNOSIS — C8589 Other specified types of non-Hodgkin lymphoma, extranodal and solid organ sites: Secondary | ICD-10-CM | POA: Insufficient documentation

## 2018-01-06 DIAGNOSIS — Z86711 Personal history of pulmonary embolism: Secondary | ICD-10-CM | POA: Insufficient documentation

## 2018-01-06 LAB — CBC WITH DIFFERENTIAL (CANCER CENTER ONLY)
BASOS ABS: 0 10*3/uL (ref 0.0–0.1)
BASOS PCT: 0 %
EOS PCT: 3 %
Eosinophils Absolute: 0.2 10*3/uL (ref 0.0–0.5)
HEMATOCRIT: 33.8 % — AB (ref 38.4–49.9)
Hemoglobin: 11.2 g/dL — ABNORMAL LOW (ref 13.0–17.1)
Lymphocytes Relative: 14 %
Lymphs Abs: 1 10*3/uL (ref 0.9–3.3)
MCH: 31.7 pg (ref 27.2–33.4)
MCHC: 33.1 g/dL (ref 32.0–36.0)
MCV: 95.8 fL (ref 79.3–98.0)
MONO ABS: 0.5 10*3/uL (ref 0.1–0.9)
Monocytes Relative: 8 %
NEUTROS ABS: 5 10*3/uL (ref 1.5–6.5)
Neutrophils Relative %: 75 %
PLATELETS: 146 10*3/uL (ref 140–400)
RBC: 3.53 MIL/uL — ABNORMAL LOW (ref 4.20–5.82)
RDW: 14.4 % (ref 11.0–14.6)
WBC: 6.8 10*3/uL (ref 4.0–10.3)

## 2018-01-06 LAB — CMP (CANCER CENTER ONLY)
ALBUMIN: 3.6 g/dL (ref 3.5–5.0)
ALT: 15 U/L (ref 0–55)
ANION GAP: 8 (ref 3–11)
AST: 19 U/L (ref 5–34)
Alkaline Phosphatase: 81 U/L (ref 40–150)
BILIRUBIN TOTAL: 0.3 mg/dL (ref 0.2–1.2)
BUN: 27 mg/dL — ABNORMAL HIGH (ref 7–26)
CO2: 26 mmol/L (ref 22–29)
Calcium: 10 mg/dL (ref 8.4–10.4)
Chloride: 106 mmol/L (ref 98–109)
Creatinine: 1.53 mg/dL — ABNORMAL HIGH (ref 0.70–1.30)
GFR, EST AFRICAN AMERICAN: 45 mL/min — AB (ref >60)
GFR, EST NON AFRICAN AMERICAN: 39 mL/min — AB (ref >60)
GLUCOSE: 101 mg/dL (ref 70–140)
POTASSIUM: 4.3 mmol/L (ref 3.5–5.1)
Sodium: 140 mmol/L (ref 136–145)
TOTAL PROTEIN: 6.4 g/dL (ref 6.4–8.3)

## 2018-01-06 LAB — PROTIME-INR
INR: 1.21
PROTHROMBIN TIME: 15.2 s (ref 11.4–15.2)

## 2018-01-06 NOTE — Telephone Encounter (Signed)
Printed avs and cad calender of upcoming appointment. Per 6/13 los

## 2018-01-06 NOTE — Progress Notes (Addendum)
[place] OFFICE PROGRESS NOTE   Diagnosis: Non-Hodgkin's lymphoma, prostate cancer  INTERVAL HISTORY:   Edward Woodward returns as scheduled.  He reports falling over the weekend.  He is concerned that he may have fractured a rib.  He has no pain.  He denies shortness of breath.  No cough.  No fever.  Wife reports no significant confusion.  He denies any bleeding.  He began a vitamin supplement about 2 weeks ago.  Objective:  Vital signs in last 24 hours:  Blood pressure (!) 124/59, pulse 65, temperature 97.7 F (36.5 C), temperature source Oral, resp. rate 18, height 5\' 10"  (1.778 m), weight 185 lb 4.8 oz (84.1 kg), SpO2 99 %.    HEENT: No thrush or ulcers. Lymphatics: No palpable cervical, supraclavicular or axillary lymph nodes. Resp: Lungs clear bilaterally. Cardio: Regular rate and rhythm. GI: Abdomen soft and nontender.  No hepatosplenomegaly. Vascular: No leg edema. Skin: Small resolving ecchymosis left mid to low back.   Lab Results:  Lab Results  Component Value Date   WBC 6.8 01/06/2018   HGB 11.2 (L) 01/06/2018   HCT 33.8 (L) 01/06/2018   MCV 95.8 01/06/2018   PLT 146 01/06/2018   NEUTROABS 5.0 01/06/2018    Imaging:  No results found.  Medications: I have reviewed the patient's current medications.  Assessment/Plan: 1. Non-Hodgkin's lymphoma (follicular grade 3 lymphoma) - status post 6 cycles of Cytoxan/prednisone/rituximab 07/01/2007 through 10/21/2007.   Biopsy of a retroperitoneal mass 05/13/2016 consistent with involvement by low-grade non-Hodgkin's lymphoma  Initiation of weekly Rituxan 06/17/2016; week 4given 07/09/2016.  CT abdomen/pelvis 09/15/2016-improvement in retroperitoneal and pelvic soft tissue  Initiation of maintenance Rituxan every 3 months 10/13/2016 (2 year course planned)  CTs 08/25/2017-new left axillary lymph node, enlargement of left pleural effusion, less masslike soft tissue in the mid retroperitoneum, new  areas of retroperitoneal and pelvic lymphadenopathy. Mildlyenlarged neck lymph nodes  Cycle 1 bendamustine 09/09/2017  Cycle 2 bendamustine 10/06/2017  Cycle 3 bendamustine 11/11/2017  CTs 12/02/2017- resolution of left axillary adenopathy, decreased celiac adenopathy, unchanged infiltrative retroperitoneal adenopathy, improvement in pelvic sidewall and right inguinal adenopathy 2. CT chest 04/01/2015 with no lymphadenopathy  CT abdomen/pelvis 05/01/2015 with mild progression of abdomen/pelvic lymphadenopathy 3. Prostate cancer - he has been treated with hormonal therapy, the PSA was stable on 10/15/2014  PSA increased 04/26/2015   Bone scan 05/09/2015 with unchanged increased activity in the thoracic and lumbar spine felt to most likely be degenerative.   Initiation of Abiraterone and prednisone 05/11/2015. Normalization of the PSA on Abiraterone. 4. History of a normocytic anemia secondary to non-Hodgkin's lymphoma, chemotherapy, and renal insufficiency - progressive secondary to GI bleeding, stable 5. History of Mild thrombocytopenia  6. History of bilateral hydronephrosis. Renal ultrasound 09/26/2014 consistent with medical renal disease. No hydronephrosis. Ureter stents removed 10/09/2016. 7. Coronary artery disease - status post coronary artery stent placement. 8. History of noncalcified lung nodules. 9. History of severe neutropenia July 2009 - likely related to delayed toxicity from rituximab. 10. Macular degeneration. 11. Right middle lobe density on a CT of the chest 06/18/2010 measuring 2.5 x 1.6 cm with mildly increased metabolic activity (SUV max 2.5) on a PET scan 06/27/2010. The area of increased density was less confluent and appeared smaller on a restaging CT 09/08/2010. Right middle lobe "scarring" with no new or suspicious pulmonary nodule on a CT 08/04/2011. 12. Mild increased metabolic activity associated with several lymph nodes on the PET scan 06/27/2010 - likely  related to  lymphoma. No lymphadenopathy in the mediastinum or axillary areas on the CT 08/04/2011. 13. Right hip discomfort-he received a steroid injection by his primary physician without improvement. He saw Dr. Collier Salina and there was a question of a lytic process in the right pelvis. A bone scan on 09/21/2012 revealed no evidence of metastatic disease. 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 Renal insufficiency-progressive CT 01/12/2016 confirmed progressive hydronephrosis  Placement of bilateral ureter stents 04/10/2016  Ureter stents removed 10/09/2016 15. Left leg DVT and pulmonary embolism 04/01/2015-on Coumadin 16. Admission 08/23/2015 with severe anemia and GI bleeding  Upper and lower endoscopy 08/26/2015 without a clear source for bleeding identified 17. Leg edema/anasarca-improved with leg wrapping and diuretics 18. Anemia secondary to chronic disease, renal failure, and possibly lymphoma 19. 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   Biopsy retroperitoneal soft tissue 05/13/2016 20. History of elevated calcium-most likely secondary to non-Hodgkin's lymphoma status post pamidronate 06/08/2016, calcitonin 06/11/2016     Disposition: Edward Woodward appears unchanged.  He continues to have a poor performance status.  There is no clinical evidence for progression of the lymphoma.  With regard to the prostate cancer we will follow-up on the PSA from today.  He continues Coumadin.  The INR is subtherapeutic today.  He recently began a vitamin supplement.  We have requested he discontinue this.  He will return for a follow-up PT/INR in 2 weeks.  He will return for lab and a follow-up visit in 3 months.  He will contact the office in the interim with any problems.  Patient seen with Dr. Benay Spice.    [name] ANP/GNP-BC   01/06/2018  12:14 PM This  was a shared visit with [name].  Edward Woodward will remain off of specific therapy for lymphoma.  He continues Zytiga/prednisone for prostate cancer.  [name], MD

## 2018-01-07 ENCOUNTER — Encounter: Payer: Self-pay | Admitting: Neurology

## 2018-01-07 ENCOUNTER — Ambulatory Visit (INDEPENDENT_AMBULATORY_CARE_PROVIDER_SITE_OTHER): Payer: Medicare Other | Admitting: Neurology

## 2018-01-07 VITALS — BP 114/70 | HR 65 | Ht 70.0 in | Wt 185.0 lb

## 2018-01-07 DIAGNOSIS — Z7189 Other specified counseling: Secondary | ICD-10-CM | POA: Diagnosis not present

## 2018-01-07 DIAGNOSIS — R296 Repeated falls: Secondary | ICD-10-CM | POA: Diagnosis not present

## 2018-01-07 DIAGNOSIS — G253 Myoclonus: Secondary | ICD-10-CM | POA: Diagnosis not present

## 2018-01-07 DIAGNOSIS — R2681 Unsteadiness on feet: Secondary | ICD-10-CM | POA: Diagnosis not present

## 2018-01-07 LAB — PROSTATE-SPECIFIC AG, SERUM (LABCORP): PROSTATE SPECIFIC AG, SERUM: 4.4 ng/mL — AB (ref 0.0–4.0)

## 2018-01-07 NOTE — Patient Instructions (Signed)
Please clarify the dose of your clonazepam medication and call my office  We will send a referral to home physical therapy  Return to clinic in 6 months

## 2018-01-07 NOTE — Progress Notes (Signed)
Follow-up Visit   Date: 01/07/18    Edward Woodward MRN: [id number] DOB: June 29, 1927   Interim History: Edward Woodward is a 84 y.o. right-handed Caucasian male with prostate cancer, non-Hodgkin's lymphoma on chemotherapy, depression, CAD status post CABG, atrial fibrillation on Coumadin, sinus node dysfunction s/p PPM, history of DVT and PE, and lumbar surgery for stenosis returning to the clinic for follow-up of myoclonic jerks and dementia.  The patient was accompanied to the clinic by wife who also provides collateral information.    History of present illness: Since early 2016, patient has started to require greater assistance for ADLs. He was previously walking with a rollator and able to bath himself.  In mid-June 2017, patient condition progressed where he was unable to walk, began falling more, hallucinating, limbs jerking, and felt increasingly weak.  He was admitted to [facility] on 6/18 -6/23 for confusion, arm shaking, and poor PO intake.  Mental status change was secondary to toxic-metabolic in setting of electrolyte derangements (hypokalemia) and acute kidney injury.  CT of the abdomen was remarkable for interval progression of a retroperitoneal/periaortic mass with encasement of the aorta suggestive of retroperitoneal fibrosis, adenopathy, or lymphoma. Also noted on CT abdomen is bilateral hydronephrosis which has worsened since the prior study and is likely secondary to compression of the proximal ureters by the aforementioned mass which was later stented. He was treated conservatively, repleted with electrotyles, and given IV hydration.  He eventually showed improvement and was discharged back to SNF. He continues to have jerking movements of the hands, but overall significantly improved from before.     He has previously been evaluated at [facility] in 2006 at which time he was diagnosed by EMG with multiple chronic severe radiculopathies with  evidence of ongoing denervation in the bilateral gastrocnemius muscles and superimposed mild axonal polyneuropathy. There was concern for hardware slippage from his prior lumbar surgery and he was evaluated by his neurosurgeon, Dr. Ellene Route in [place], and was told he had 2 screws that were loose. He elected not to undergo further surgery at that time. More recently, he was evaluated in 2013 for falls and imbalance and diagnosed with idiopathic neuropathy and due to clinical symptoms suggestive of Parkinson's disease, also offered a trail of sinemet 25/100mg  TID.   UPDATE 06/26/2016:  He was diagnosed with lymphoma since he was last here and after undergoing chemotherapy, his wife feels that his mentation is improved, saying that they even had a conversation last week.  He remains forgetful about people and events.  He does not have delusional thoughts like before.  He noticed marked improvement of his myoclonic jerks since being on clonazepam 0.25mg  bid and now only has intermittent hand tremors.   UPDATE 07/02/2017:   He is here for 6 month appointment.  He suffered a fall in October and ER visit because of congestion and treated with antibiotics.  During this illness, he became increasingly confused and cannot recognize his wife or his surroundings.  Since this time, he has spells of confusion which can last about an hours several times per week. There is no associated agitation or aggression.  His wife was unable to taper his clonazepam because it made his tremors and jerks worse.    UPDATE 01/07/2018:  He is here for 6 month follow-up visit and reports doing well.  His wife feels that he has not had any setbacks, no illnesses, or hospitalizations.  His muscle jerks remain controlled on clonazepam  and there have been no confusional spells.  He is able to bathe and feed himself and requires some assistance with dressing.  He uses a walker at home and wheelchair for long distances. His balance has  progressively worsened causing 3 falls over the past few months.  Medications:  Current Outpatient Medications on File Prior to Visit  Medication Sig Dispense Refill  . acetaminophen (TYLENOL) 500 MG tablet Take 1,000 mg by mouth every 6 (six) hours as needed for mild pain, moderate pain or headache.     . citalopram (CELEXA) 20 MG tablet Take 10 mg by mouth daily.     . clonazePAM (KLONOPIN) 0.25 MG disintegrating tablet Take 0.125 mg by mouth 2 (two) times daily. For restless legs    . hydrocortisone 2.5 % ointment Apply 1 application topically 2 (two) times daily as needed. Applied to affected area(s) of skin( pressure sores)    . Melatonin 3 MG TABS Take 3 mg by mouth at bedtime.    . nitroGLYCERIN (NITROLINGUAL) 0.4 MG/SPRAY spray Place 1 spray under the tongue every 5 (five) minutes x 3 doses as needed for chest pain. 12 g 3  . ondansetron (ZOFRAN) 4 MG tablet Take 1 tablet (4 mg total) by mouth every 8 (eight) hours as needed for nausea or vomiting. 20 tablet 0  . Polyethyl Glycol-Propyl Glycol (SYSTANE) 0.4-0.3 % SOLN Place 1 drop into both eyes at bedtime.     . polyethylene glycol (MIRALAX / GLYCOLAX) packet Take 17 g by mouth daily as needed for moderate constipation.     . predniSONE (DELTASONE) 5 MG tablet Take 1 tablet (5 mg total) by mouth daily. 90 tablet 0  . tamsulosin (FLOMAX) 0.4 MG CAPS capsule Take 1 capsule (0.4 mg total) by mouth at bedtime. 30 capsule 0  . torsemide (DEMADEX) 20 MG tablet Take 20 mg by mouth as needed (for swelling (edema)).    Marland Kitchen triamcinolone ointment (KENALOG) 0.1 % Apply 1 application topically See admin instructions. Use once to twice daily applied to affected area(s) of skin.    Marland Kitchen vitamin B-12 (CYANOCOBALAMIN) 1000 MCG tablet Take 1,000 mcg by mouth at bedtime.     Marland Kitchen warfarin (COUMADIN) 5 MG tablet Take 1 tablet (5 mg total) by mouth daily at 6 PM. Except every MWF take half tablet (2.5 mg) 30 tablet 1  . ZYTIGA 250 MG tablet TAKE 4 TABLETS BY MOUTH  ONCE DAILY AS DIRECTED.  TAKE 1 HOUR BEFORE OR 2 HOURS AFTER A MEAL 120 tablet 5   No current facility-administered medications on file prior to visit.     Allergies:  Allergies  Allergen Reactions  . Penicillins Hives, Shortness Of Breath and Other (See Comments)    Has patient had a PCN reaction causing immediate rash, facial/tongue/throat swelling, SOB or lightheadedness with hypotension: Yes Has patient had a PCN reaction causing severe rash involving mucus membranes or skin necrosis: No Has patient had a PCN reaction that required hospitalization No Has patient had a PCN reaction occurring within the last 10 years: No If all of the above answers are "NO", then may proceed with Cephalosporin use.   . Simvastatin Other (See Comments)    Reaction:  Muscle weakness    Review of Systems:  CONSTITUTIONAL: No fevers, chills, night sweats, or weight loss.  EYES: No visual changes or eye pain ENT: No hearing changes.  No history of nose bleeds.   RESPIRATORY: No cough, wheezing and shortness of breath.   CARDIOVASCULAR: Negative  for chest pain, and palpitations.   GI: Negative for abdominal discomfort, blood in stools or black stools.  No recent change in bowel habits.   GU:  No history of incontinence.   MUSCLOSKELETAL: No history of joint pain or swelling.  No myalgias.   SKIN: Negative for lesions, rash, and itching.   ENDOCRINE: Negative for cold or heat intolerance, polydipsia or goiter.   PSYCH:  No depression or anxiety symptoms.   NEURO: As Above.   Vital Signs:  BP 114/70   Pulse 65   Ht 5\' 10"  (1.778 m)   Wt 185 lb (83.9 kg)   SpO2 97%   BMI 26.54 kg/m   General Medical Exam:   General:  Elderly appearing, comfortable, there is color and fullness to his face, which is improved Neck.  No carotid bruits. Respiratory:  Clear to auscultation, good air entry bilaterally.   Cardiac:  Regular rate and rhythm, no murmur.   Ext:  No edema, mild ecchymosis over the  forearms  Neurological Exam: MENTAL STATUS including orientation to time, place, person, recent and remote memory, attention span and concentration, language, and fund of knowledge is fair.  Affect is blunted, but he smiles appropriately.  CRANIAL NERVES:  Pupils are round and reactive. Extraocular muscles intact, mild upgaze restriction.  Face is symmetric.   MOTOR:  Motor strength is 5/5 in all extremities, including hip flexors.  No tremors or rigidity.  COORDINATION/GAIT:  Finger tapping and toe tapping is normal for age. He is in wheelchair, gait not tested  Data: CT head 01/12/2016: No acute intracranial hemorrhage. Age-related atrophy and chronic microvascular ischemic disease.   IMPRESSION/PLAN: 1.  Multifocal myoclonic jerks due to hypercalcemia - resolved.  He remains on clonazepam 0.125mg  twice daily, which given that movements are well-controlled, discussed reducing to half-tablet of 0.125mg  bid, which they will try and let me know how he tolerates this.  Previously, I had considered possibility of Parkinson's disease, however on exam today, there is no tremor, bradykinesia, or rigidity.  He has a severely blunted affected and stooped, slow gait, and I will continue to follow him to see how symptoms evolve but do not see any indication to start levadopa at this time.   2.  Dementia without behavior disturbance. Clinically stable.  He is able to perform basic ADLs independently.  3.  Multifactorial gait disorder.  Start home PT.   4.  Given his advanced age and dementia I discussed Advanced care planning.  I spent 15 minutes in face-to-face counseling re: Advance Care Planning including the discussion of the patient's goals of care and preferences, future treatment options and decision, and an explanation of advanced directives.  In addition to the patient, his wife was present.  Goals of care discussion included maintaining quality of life, PEG/trach, home safety, and symptom  management.  His wife is POA and from their past discussions, he has expressed to be full code but in the event there is no meaningful chance of recovery, does not wish to be on prolonged respiratory support. Patient and family had the opportunity to ask questions and I answered them to the best of my ability.   Return to clinic in 6 months   Thank you for allowing me to participate in patient's care.  If I can answer any additional questions, I would be pleased to do so.    Sincerely,    Om Lizotte K. Posey Pronto, DO

## 2018-01-10 MED ORDER — CLONAZEPAM 0.125 MG PO TBDP: 0.0600 mg | ORAL_TABLET | Freq: Two times a day (BID) | ORAL | 5 refills | Status: DC

## 2018-01-12 ENCOUNTER — Telehealth: Payer: Self-pay | Admitting: Pharmacist

## 2018-01-12 NOTE — Telephone Encounter (Signed)
Oral Oncology Pharmacist Encounter  Received call from patient's wife with questions about continued Zytiga acquisition. Patient is currently enrolled with the [place] patient assistance foundation ([employer]) to receive Zytiga at $0 out-of-pocket cost to the patient until 07/26/2018.  They have received a letter from their prescription insurance company ([place] prescription coverage) that states their preferred agent is the generic version abiraterone acetate.  Edward Woodward states they are receiving brand name Zytiga from [facility], which is the contracted dispensing pharmacy for [employer].  Edward Woodward states she has just spoken with dispensing pharmacy, and they will be mailing next supply of brand name Zytiga by the end of the week.  I instructed Edward Woodward that pharmacy is still able to process prescription and get a paid claim from her prescription insurance company for brand name Zytiga. I know this because the pharmacy has already called to set up delivery of next month supply, which means they are able to process prescription at pharmacy.  I will obtain letter from [place] prescription insurance coverage for clarification on covered medications.  Edward Woodward will alert the office if she hears from dispensing pharmacy that they are having any trouble processing their claim.  Oral Oncology Clinic will continue to follow.  [name], PharmD, BCPS, BCOP  01/12/2018 9:11 AM Oral Oncology Clinic [phone]

## 2018-01-12 NOTE — Telephone Encounter (Signed)
Oral Oncology Patient Advocate Encounter  I have contacted the insurance company ([employer]), [employer] and [employer] and [place].  The patient's copayment for generic abiraterone would be $455.20 monthly.    I was informed by Eveline Keto and Wynetta Emery and Wynetta Emery that no further action needed to be taken by the provider's office and that the patient would be able to continue to obtain their medication as they have thus far.   Fabio Asa. Melynda Keller, [place] Patient [place] [phone] 01/12/2018 1:33 PM

## 2018-01-13 ENCOUNTER — Other Ambulatory Visit: Payer: Self-pay

## 2018-01-13 DIAGNOSIS — C61 Malignant neoplasm of prostate: Secondary | ICD-10-CM

## 2018-01-13 MED ORDER — TAMSULOSIN HCL 0.4 MG PO CAPS: 0.4000 mg | ORAL_CAPSULE | Freq: Every day | ORAL | 0 refills | Status: DC

## 2018-01-20 ENCOUNTER — Inpatient Hospital Stay: Payer: Medicare Other

## 2018-01-20 DIAGNOSIS — Z7901 Long term (current) use of anticoagulants: Secondary | ICD-10-CM | POA: Diagnosis not present

## 2018-01-20 DIAGNOSIS — Z8546 Personal history of malignant neoplasm of prostate: Secondary | ICD-10-CM | POA: Diagnosis not present

## 2018-01-20 DIAGNOSIS — Z86711 Personal history of pulmonary embolism: Secondary | ICD-10-CM | POA: Diagnosis not present

## 2018-01-20 DIAGNOSIS — I2699 Other pulmonary embolism without acute cor pulmonale: Secondary | ICD-10-CM

## 2018-01-20 DIAGNOSIS — Z79899 Other long term (current) drug therapy: Secondary | ICD-10-CM | POA: Diagnosis not present

## 2018-01-20 DIAGNOSIS — C8589 Other specified types of non-Hodgkin lymphoma, extranodal and solid organ sites: Secondary | ICD-10-CM | POA: Diagnosis not present

## 2018-01-20 DIAGNOSIS — Z86718 Personal history of other venous thrombosis and embolism: Secondary | ICD-10-CM | POA: Diagnosis not present

## 2018-01-20 LAB — PROTIME-INR
INR: 1.41
Prothrombin Time: 17.2 seconds — ABNORMAL HIGH (ref 11.4–15.2)

## 2018-01-21 ENCOUNTER — Telehealth: Payer: Self-pay | Admitting: Oncology

## 2018-01-21 ENCOUNTER — Telehealth: Payer: Self-pay | Admitting: Emergency Medicine

## 2018-01-21 NOTE — Telephone Encounter (Addendum)
Detailed message left regarding note. scheduling message sent for lab appt.   ----- Message from Owens Shark, NP sent at 01/21/2018  8:49 AM EDT ----- Please instruct him to continue the same dose of Coumadin.  Repeat PT/INR in 2 weeks.

## 2018-01-21 NOTE — Telephone Encounter (Signed)
Scheduled appt per 6/28 sch message - left message for patient with appt date and time

## 2018-02-03 ENCOUNTER — Other Ambulatory Visit: Payer: Self-pay

## 2018-02-03 DIAGNOSIS — Z7901 Long term (current) use of anticoagulants: Secondary | ICD-10-CM

## 2018-02-04 ENCOUNTER — Inpatient Hospital Stay: Payer: Medicare Other | Attending: Oncology

## 2018-02-04 ENCOUNTER — Telehealth: Payer: Self-pay

## 2018-02-04 DIAGNOSIS — I824Z2 Acute embolism and thrombosis of unspecified deep veins of left distal lower extremity: Secondary | ICD-10-CM | POA: Insufficient documentation

## 2018-02-04 DIAGNOSIS — Z7901 Long term (current) use of anticoagulants: Secondary | ICD-10-CM | POA: Insufficient documentation

## 2018-02-04 LAB — PROTIME-INR
INR: 1.34
Prothrombin Time: 16.5 seconds — ABNORMAL HIGH (ref 11.4–15.2)

## 2018-02-04 NOTE — Telephone Encounter (Signed)
Per MD take 5mg  coumadin daily, except M,Th to take 2.5mg . Wife voiced understanding.   Pt wife voiced understanding and also voiced that pt was bleeding from his elbow and blood was everywhere. "This was around July 4th. We haven't had much of a problem with that before". This RN will make MD aware.

## 2018-02-07 ENCOUNTER — Ambulatory Visit (INDEPENDENT_AMBULATORY_CARE_PROVIDER_SITE_OTHER): Payer: Medicare Other | Admitting: *Deleted

## 2018-02-07 ENCOUNTER — Encounter: Payer: Self-pay | Admitting: Internal Medicine

## 2018-02-07 DIAGNOSIS — I495 Sick sinus syndrome: Secondary | ICD-10-CM | POA: Diagnosis not present

## 2018-02-07 NOTE — Progress Notes (Signed)
Remote pacemaker transmission.   

## 2018-02-08 LAB — CUP PACEART REMOTE DEVICE CHECK
Battery Remaining Longevity: 90 mo
Battery Remaining Percentage: 100 %
Implantable Lead Implant Date: 20081013
Implantable Lead Model: 4457
Implantable Lead Model: 4470
Implantable Pulse Generator Implant Date: 20181214
Lead Channel Impedance Value: 454 Ohm
Lead Channel Impedance Value: 519 Ohm
Lead Channel Pacing Threshold Amplitude: 1.6 V
Lead Channel Pacing Threshold Pulse Width: 0.4 ms
Lead Channel Pacing Threshold Pulse Width: 0.4 ms
Lead Channel Setting Pacing Amplitude: 3 V
MDC IDC LEAD IMPLANT DT: 20081013
MDC IDC LEAD LOCATION: 753859
MDC IDC LEAD LOCATION: 753860
MDC IDC LEAD SERIAL: 205344
MDC IDC LEAD SERIAL: 210110
MDC IDC MSMT LEADCHNL RA PACING THRESHOLD AMPLITUDE: 0.5 V
MDC IDC PG SERIAL: 377798
MDC IDC SESS DTM: 20190715042800
MDC IDC SET LEADCHNL RA PACING AMPLITUDE: 2 V
MDC IDC SET LEADCHNL RV PACING PULSEWIDTH: 0.4 ms
MDC IDC SET LEADCHNL RV SENSING SENSITIVITY: 2.5 mV
MDC IDC STAT BRADY RA PERCENT PACED: 80 %
MDC IDC STAT BRADY RV PERCENT PACED: 3 %

## 2018-02-09 ENCOUNTER — Encounter: Payer: Self-pay | Admitting: Cardiology

## 2018-02-10 DIAGNOSIS — H353134 Nonexudative age-related macular degeneration, bilateral, advanced atrophic with subfoveal involvement: Secondary | ICD-10-CM | POA: Diagnosis not present

## 2018-02-10 DIAGNOSIS — Z961 Presence of intraocular lens: Secondary | ICD-10-CM | POA: Diagnosis not present

## 2018-02-10 DIAGNOSIS — H35371 Puckering of macula, right eye: Secondary | ICD-10-CM | POA: Diagnosis not present

## 2018-02-21 ENCOUNTER — Encounter: Payer: Self-pay | Admitting: Neurology

## 2018-02-21 MED ORDER — CLONAZEPAM 0.125 MG PO TBDP: ORAL_TABLET | ORAL | 5 refills | Status: DC

## 2018-02-24 ENCOUNTER — Encounter: Payer: Self-pay | Admitting: Interventional Cardiology

## 2018-02-26 ENCOUNTER — Encounter: Payer: Self-pay | Admitting: Internal Medicine

## 2018-03-02 MED ORDER — METOPROLOL SUCCINATE ER 25 MG PO TB24: 25.0000 mg | ORAL_TABLET | Freq: Every day | ORAL | 3 refills | Status: DC

## 2018-03-02 NOTE — Telephone Encounter (Signed)
Per Dr Martinique, pt should restart his Toprol XL 25mg  qd. Pt agrees with this plan. New prescription sent in. Pt will call us if he notes any hypotension.

## 2018-03-17 DIAGNOSIS — M545 Low back pain: Secondary | ICD-10-CM | POA: Diagnosis not present

## 2018-03-22 ENCOUNTER — Other Ambulatory Visit: Payer: Self-pay

## 2018-03-22 DIAGNOSIS — Z7901 Long term (current) use of anticoagulants: Secondary | ICD-10-CM

## 2018-03-22 MED ORDER — WARFARIN SODIUM 5 MG PO TABS
5.0000 mg | ORAL_TABLET | Freq: Every day | ORAL | 1 refills | Status: DC
Start: 2018-03-22 — End: 2018-04-18

## 2018-04-05 ENCOUNTER — Other Ambulatory Visit: Payer: Self-pay | Admitting: Emergency Medicine

## 2018-04-05 DIAGNOSIS — C61 Malignant neoplasm of prostate: Secondary | ICD-10-CM

## 2018-04-05 MED ORDER — PREDNISONE 5 MG PO TABS: 5.0000 mg | ORAL_TABLET | Freq: Every day | ORAL | 0 refills | Status: DC

## 2018-04-06 ENCOUNTER — Ambulatory Visit (INDEPENDENT_AMBULATORY_CARE_PROVIDER_SITE_OTHER): Payer: Medicare Other | Admitting: Interventional Cardiology

## 2018-04-06 ENCOUNTER — Encounter: Payer: Self-pay | Admitting: Interventional Cardiology

## 2018-04-06 VITALS — BP 140/70 | HR 65 | Ht 70.0 in | Wt 187.0 lb

## 2018-04-06 DIAGNOSIS — I5032 Chronic diastolic (congestive) heart failure: Secondary | ICD-10-CM | POA: Diagnosis not present

## 2018-04-06 DIAGNOSIS — I10 Essential (primary) hypertension: Secondary | ICD-10-CM

## 2018-04-06 DIAGNOSIS — Z950 Presence of cardiac pacemaker: Secondary | ICD-10-CM

## 2018-04-06 DIAGNOSIS — I495 Sick sinus syndrome: Secondary | ICD-10-CM | POA: Diagnosis not present

## 2018-04-06 DIAGNOSIS — I480 Paroxysmal atrial fibrillation: Secondary | ICD-10-CM | POA: Diagnosis not present

## 2018-04-06 NOTE — Patient Instructions (Signed)
Medication Instructions:  Your physician recommends that you continue on your current medications as directed. Please refer to the Current Medication list given to you today.   Labwork: none  Testing/Procedures: none  Follow-Up: Your physician wants you to follow-up in: 9-12 months.  You will receive a reminder letter in the mail two months in advance. If you don't receive a letter, please call our office to schedule the follow-up appointment.   Any Other Special Instructions Will Be Listed Below (If Applicable).     If you need a refill on your cardiac medications before your next appointment, please call your pharmacy.

## 2018-04-06 NOTE — Progress Notes (Signed)
Cardiology Office Note:    Date:  04/06/2018   ID:  Edward Woodward, DOB [birth year], MRN [id number]  PCP:  Lajean Manes, MD  Cardiologist:  No primary care provider on file.   Referring MD: Lajean Manes, MD   Chief Complaint  Patient presents with  . Atrial Fibrillation  . Coronary Artery Disease    History of Present Illness:    Edward Woodward is a 84 y.o. male with a hx of presents for Chronic systolic/diastolic heart failure, permanent pacemaker insertion, atrial fibrillation, hypertension, history of pulmonary emboli, chronic venous insufficiency with peripheral edema, and obstructive sleep apnea.  Edward Woodward is doing well.  He is not having dyspnea or chest pain.  No orthopnea or PND.  No episodes of syncope.  Intensity of medication was decreased by Dr. Caryl Comes.  Episodes of lightheadedness, dizziness, and weakness while standing and during showers has improved.  Past Medical History:  Diagnosis Date  . AMD (age-related macular degeneration), bilateral   . Anemia   . Anginal pain ([place])    pts wife states pt had to use nitroglycerin this am 04/09/2016  . CAD (coronary artery disease)     NYHA Class 1B, CABG 1985  . CHF (congestive heart failure) ([place])   . Collagen vascular disease ([place])   . Collapsed lung    right   . Complication of anesthesia    pts wife states pt gets confused   . Degeneration of lumbar or lumbosacral intervertebral disc   . Depressive disorder, not elsewhere classified   . Dysrhythmia   . GERD (gastroesophageal reflux disease)   . History of blood transfusion   . History of hiatal hernia   . HTN (hypertension)   . Hypercholesteremia   . Lower leg edema    bilat  . Multiple falls   . Myocardial infarction ([place])   . Neuromuscular disorder (HCC)    neuropathy  . Neuropathy   . Non Hodgkin's lymphoma ([place])   . OSA on CPAP   . PE (pulmonary embolism) 03/2015  . Presence of permanent cardiac pacemaker   . Prostate cancer ([place])   .  Shortness of breath dyspnea    with exertion   . Sinoatrial node dysfunction (HCC)   . Ulcers of both lower legs (HCC)    history of   . Urinary incontinence     Past Surgical History:  Procedure Laterality Date  . APPENDECTOMY    . BACK SURGERY    . CARDIAC CATHETERIZATION N/A 08/27/2015   Procedure: Left Heart Cath and Cors/Grafts Angiography;  Surgeon: Belva Crome, MD;  Location: [place] [facility];  Service: Cardiovascular;  Laterality: N/A;  . COLONOSCOPY N/A 08/26/2015   Procedure: COLONOSCOPY;  Surgeon: Clarene Essex, MD;  Location: [facility] ENDOSCOPY;  Service: Endoscopy;  Laterality: N/A;  . CORONARY ANGIOPLASTY WITH STENT PLACEMENT     s/p bare metal stent implant SVG-diagonal 11/23/05, s/p BM stent implantation SVG diagonal 10/22/06 (feeds LAD), and 05/2010 with DES to left circumflex  . [place]   SVG to Diagonal/LAD,  seqSVG to OM1 and OM2  . CYSTOSCOPY W/ URETERAL STENT PLACEMENT Bilateral 04/10/2016   Procedure: CYSTOSCOPY WITH RETROGRADE PYELOGRAM/URETERAL STENT PLACEMENT;  Surgeon: Festus Aloe, MD;  Location: [facility];  Service: Urology;  Laterality: Bilateral;  . CYSTOSCOPY W/ URETERAL STENT PLACEMENT Bilateral 10/09/2016   Procedure: CYSTOSCOPY WITH BILATERAL RETROGRADE PYELOGRAM Edward Woodward  URETERAL STENT REMOVAL;  Surgeon: Festus Aloe, MD;  Location: [facility];  Service: Urology;  Laterality: Bilateral;  . ENDARTERECTOMY     bilat   . ESOPHAGOGASTRODUODENOSCOPY (EGD) WITH PROPOFOL N/A 08/26/2015   Procedure: ESOPHAGOGASTRODUODENOSCOPY (EGD) WITH PROPOFOL;  Surgeon: Clarene Essex, MD;  Location: [facility] ENDOSCOPY;  Service: Endoscopy;  Laterality: N/A;  . EXPLORATORY LAPAROTOMY    . LUMBAR FUSION    . PACEMAKER INSERTION    . PPM GENERATOR CHANGEOUT N/A 07/09/2017   Procedure: PPM GENERATOR CHANGEOUT;  Surgeon: Deboraha Sprang, MD;  Location: [place] [facility];  Service: Cardiovascular;  Laterality: N/A;  . ROTATOR CUFF REPAIR      bilat  . TONSILLECTOMY      Current Medications: Current Meds  Medication Sig  . acetaminophen (TYLENOL) 500 MG tablet Take 1,000 mg by mouth every 6 (six) hours as needed for mild pain, moderate pain or headache.   . citalopram (CELEXA) 20 MG tablet Take 10 mg by mouth daily.   . clonazepam (KLONOPIN) 0.125 MG disintegrating tablet Take 1 tablet in the morning and half-tablet at bedtime.  . hydrocortisone 2.5 % ointment Apply 1 application topically 2 (two) times daily as needed. Applied to affected area(s) of skin( pressure sores)  . Melatonin 3 MG TABS Take 3 mg by mouth at bedtime.  . metoprolol succinate (TOPROL XL) 25 MG 24 hr tablet Take 1 tablet (25 mg total) by mouth daily.  . nitroGLYCERIN (NITROLINGUAL) 0.4 MG/SPRAY spray Place 1 spray under the tongue every 5 (five) minutes x 3 doses as needed for chest pain.  Marland Kitchen ondansetron (ZOFRAN) 4 MG tablet Take 1 tablet (4 mg total) by mouth every 8 (eight) hours as needed for nausea or vomiting.  Vladimir Faster Glycol-Propyl Glycol (SYSTANE) 0.4-0.3 % SOLN Place 1 drop into both eyes at bedtime.   . polyethylene glycol (MIRALAX / GLYCOLAX) packet Take 17 g by mouth daily as needed for moderate constipation.   . predniSONE (DELTASONE) 5 MG tablet Take 1 tablet (5 mg total) by mouth daily.  . tamsulosin (FLOMAX) 0.4 MG CAPS capsule Take 1 capsule (0.4 mg total) by mouth at bedtime.  . torsemide (DEMADEX) 20 MG tablet Take 20 mg by mouth as needed (for swelling (edema)).  Marland Kitchen triamcinolone ointment (KENALOG) 0.1 % Apply 1 application topically See admin instructions. Use once to twice daily applied to affected area(s) of skin.  Marland Kitchen vitamin B-12 (CYANOCOBALAMIN) 1000 MCG tablet Take 1,000 mcg by mouth at bedtime.   Marland Kitchen warfarin (COUMADIN) 5 MG tablet Take 1 tablet (5 mg total) by mouth daily at 6 PM. Except every M,Th take half tablet (2.5 mg)  . ZYTIGA 250 MG tablet TAKE 4 TABLETS BY MOUTH ONCE DAILY AS DIRECTED.  TAKE 1 HOUR BEFORE OR 2 HOURS AFTER A  MEAL     Allergies:   Penicillins and Simvastatin   Social History   Socioeconomic History  . Marital status: Married    Spouse name: Not on file  . Number of children: 3  . Years of education: 16  . Highest education level: Bachelor's degree (e.g., BA, AB, BS)  Occupational History  . Occupation: Retired Mining engineer  . Financial resource strain: Not on file  . Food insecurity:    Worry: Not on file    Inability: Not on file  . Transportation needs:    Medical: Not on file    Non-medical: Not on file  Tobacco Use  . Smoking status: Former Smoker    Packs/day: 0.50    Years: 5.00  Pack years: 2.50    Types: Cigarettes    Last attempt to quit: 07/28/1955    Years since quitting: 62.7  . Smokeless tobacco: Never Used  Substance and Sexual Activity  . Alcohol use: No  . Drug use: No  . Sexual activity: Never  Lifestyle  . Physical activity:    Days per week: Not on file    Minutes per session: Not on file  . Stress: Not on file  Relationships  . Social connections:    Talks on phone: Not on file    Gets together: Not on file    Attends religious service: Not on file    Active member of club or organization: Not on file    Attends meetings of clubs or organizations: Not on file    Relationship status: Not on file  Other Topics Concern  . Not on file  Social History Narrative   Lives with wife in a one story home.  Has 3 children and 2 stepchildren.  Retired Optometrist.  Education: college.     Family History: The patient's family history includes Cancer in his mother; Heart disease in his brother; Heart disease (age of onset: 51) in his father.  ROS:   Please see the history of present illness.    Fatigue.  No longer on chemotherapy.  All other systems reviewed and are negative.  EKGs/Labs/Other Studies Reviewed:    The following studies were reviewed today: No new data  EKG:  EKG is not ordered today.    Recent Labs: 01/06/2018: ALT 15;  BUN 27; Creatinine 1.53; Hemoglobin 11.2; Platelet Count 146; Potassium 4.3; Sodium 140  Recent Lipid Panel    Component Value Date/Time   CHOL 139 08/24/2015 0343   TRIG 134 08/24/2015 0343   HDL 32 (L) 08/24/2015 0343   CHOLHDL 4.3 08/24/2015 0343   VLDL 27 08/24/2015 0343   LDLCALC 80 08/24/2015 0343    Physical Exam:    VS:  BP 140/70   Pulse 65   Ht 5\' 10"  (1.778 m)   Wt 187 lb (84.8 kg)   SpO2 97%   BMI 26.83 kg/m     Wt Readings from Last 3 Encounters:  04/06/18 187 lb (84.8 kg)  01/07/18 185 lb (83.9 kg)  01/06/18 185 lb 4.8 oz (84.1 kg)     GEN: Elderly with moderate frailty.  Well developed in no acute distress HEENT: Normal NECK: No JVD. LYMPHATICS: No lymphadenopathy CARDIAC: RRR, 1/6 systolic murmur, no gallop, no edema. VASCULAR: 2+ bilateral radial pulses.  No bruits. RESPIRATORY:  Clear to auscultation without rales, wheezing or rhonchi  ABDOMEN: Soft, non-tender, non-distended, No pulsatile mass, MUSCULOSKELETAL: No deformity  SKIN: Warm and dry NEUROLOGIC:  Alert and oriented x 3 PSYCHIATRIC:  Normal affect   ASSESSMENT:    1. Sinus node dysfunction (HCC)   2. Paroxysmal atrial fibrillation ([place])   3. Chronic diastolic heart failure ([place])   4. Essential hypertension, benign   5. Cardiac pacemaker in situ    PLAN:    In order of problems listed above:  1. Permanent pacemaker with normal function 2. No clinical symptomatology.  Patient is successfully anticoagulated. 3. No evidence of volume overload. 4. He has tendency towards fainting and orthostatic hypotension.  Blood pressures are slightly higher than I would like.  Rather than 140/90 mmHg we will except 150 to 536 mmHg systolic since he has had clear episodes of near syncope especially when standing in his shower.  Overall he  is stable.  No change in therapy.   Though Mrs. Largo is concerned that he occasionally has blood pressure elevations up to 160 mmHg, he has significant  orthostasis and I am reluctant to intensify his antihypertensive regimen.  On stronger blood pressure control he has had difficulty with standing and feeling weak/dizzy in the shower.  We should go with higher blood pressures to avoid syncope and falls.  Clinical follow-up in 1 year.  Medication Adjustments/Labs and Tests Ordered: Current medicines are reviewed at length with the patient today.  Concerns regarding medicines are outlined above.  No orders of the defined types were placed in this encounter.  No orders of the defined types were placed in this encounter.   Patient Instructions  Medication Instructions:  Your physician recommends that you continue on your current medications as directed. Please refer to the Current Medication list given to you today.   Labwork: none  Testing/Procedures: none  Follow-Up: Your physician wants you to follow-up in: 9-12 months.  You will receive a reminder letter in the mail two months in advance. If you don't receive a letter, please call our office to schedule the follow-up appointment.   Any Other Special Instructions Will Be Listed Below (If Applicable).     If you need a refill on your cardiac medications before your next appointment, please call your pharmacy.      Signed, Sinclair Grooms, MD  04/06/2018 3:35 PM    [facility]

## 2018-04-07 ENCOUNTER — Inpatient Hospital Stay: Payer: Medicare Other | Attending: Oncology | Admitting: Oncology

## 2018-04-07 ENCOUNTER — Inpatient Hospital Stay: Payer: Medicare Other

## 2018-04-07 ENCOUNTER — Telehealth: Payer: Self-pay | Admitting: Oncology

## 2018-04-07 VITALS — BP 129/59 | HR 65 | Temp 97.9°F | Resp 18 | Ht 70.0 in | Wt 186.4 lb

## 2018-04-07 DIAGNOSIS — Z79899 Other long term (current) drug therapy: Secondary | ICD-10-CM | POA: Diagnosis not present

## 2018-04-07 DIAGNOSIS — Z86718 Personal history of other venous thrombosis and embolism: Secondary | ICD-10-CM | POA: Diagnosis not present

## 2018-04-07 DIAGNOSIS — Z86711 Personal history of pulmonary embolism: Secondary | ICD-10-CM | POA: Insufficient documentation

## 2018-04-07 DIAGNOSIS — Z7901 Long term (current) use of anticoagulants: Secondary | ICD-10-CM | POA: Diagnosis not present

## 2018-04-07 DIAGNOSIS — N189 Chronic kidney disease, unspecified: Secondary | ICD-10-CM | POA: Diagnosis not present

## 2018-04-07 DIAGNOSIS — I2699 Other pulmonary embolism without acute cor pulmonale: Secondary | ICD-10-CM

## 2018-04-07 DIAGNOSIS — C61 Malignant neoplasm of prostate: Secondary | ICD-10-CM

## 2018-04-07 DIAGNOSIS — Z23 Encounter for immunization: Secondary | ICD-10-CM | POA: Diagnosis not present

## 2018-04-07 DIAGNOSIS — C8228 Follicular lymphoma grade III, unspecified, lymph nodes of multiple sites: Secondary | ICD-10-CM

## 2018-04-07 DIAGNOSIS — D631 Anemia in chronic kidney disease: Secondary | ICD-10-CM | POA: Insufficient documentation

## 2018-04-07 DIAGNOSIS — Z8546 Personal history of malignant neoplasm of prostate: Secondary | ICD-10-CM | POA: Insufficient documentation

## 2018-04-07 DIAGNOSIS — C8299 Follicular lymphoma, unspecified, extranodal and solid organ sites: Secondary | ICD-10-CM

## 2018-04-07 LAB — CMP (CANCER CENTER ONLY)
ALK PHOS: 71 U/L (ref 38–126)
ALT: 8 U/L (ref 0–44)
ANION GAP: 7 (ref 5–15)
AST: 13 U/L — ABNORMAL LOW (ref 15–41)
Albumin: 3.3 g/dL — ABNORMAL LOW (ref 3.5–5.0)
BUN: 24 mg/dL — ABNORMAL HIGH (ref 8–23)
CALCIUM: 9.5 mg/dL (ref 8.9–10.3)
CO2: 27 mmol/L (ref 22–32)
CREATININE: 1.4 mg/dL — AB (ref 0.61–1.24)
Chloride: 106 mmol/L (ref 98–111)
GFR, EST AFRICAN AMERICAN: 50 mL/min — AB (ref >60)
GFR, EST NON AFRICAN AMERICAN: 43 mL/min — AB (ref >60)
Glucose, Bld: 98 mg/dL (ref 70–99)
Potassium: 4.5 mmol/L (ref 3.5–5.1)
Sodium: 140 mmol/L (ref 135–145)
Total Bilirubin: 0.4 mg/dL (ref 0.3–1.2)
Total Protein: 6.1 g/dL — ABNORMAL LOW (ref 6.5–8.1)

## 2018-04-07 LAB — CBC WITH DIFFERENTIAL (CANCER CENTER ONLY)
BASOS ABS: 0 10*3/uL (ref 0.0–0.1)
BASOS PCT: 0 %
EOS PCT: 4 %
Eosinophils Absolute: 0.3 10*3/uL (ref 0.0–0.5)
HEMATOCRIT: 33.4 % — AB (ref 38.4–49.9)
Hemoglobin: 11.1 g/dL — ABNORMAL LOW (ref 13.0–17.1)
LYMPHS ABS: 1 10*3/uL (ref 0.9–3.3)
LYMPHS PCT: 16 %
MCH: 31 pg (ref 27.2–33.4)
MCHC: 33.2 g/dL (ref 32.0–36.0)
MCV: 93.3 fL (ref 79.3–98.0)
MONOS PCT: 9 %
Monocytes Absolute: 0.6 10*3/uL (ref 0.1–0.9)
Neutro Abs: 4.8 10*3/uL (ref 1.5–6.5)
Neutrophils Relative %: 71 %
PLATELETS: 138 10*3/uL — AB (ref 140–400)
RBC: 3.58 MIL/uL — ABNORMAL LOW (ref 4.20–5.82)
RDW: 14 % (ref 11.0–14.6)
WBC Count: 6.7 10*3/uL (ref 4.0–10.3)

## 2018-04-07 LAB — PROTIME-INR
INR: 2.38
Prothrombin Time: 25.8 seconds — ABNORMAL HIGH (ref 11.4–15.2)

## 2018-04-07 MED ORDER — INFLUENZA VAC SPLIT QUAD 0.5 ML IM SUSY
PREFILLED_SYRINGE | INTRAMUSCULAR | Status: AC
  Filled 2018-04-07: qty 0.5

## 2018-04-07 MED ORDER — INFLUENZA VAC SPLIT QUAD 0.5 ML IM SUSY
0.5000 mL | PREFILLED_SYRINGE | Freq: Once | INTRAMUSCULAR | Status: AC
  Administered 2018-04-07: 0.5 mL via INTRAMUSCULAR

## 2018-04-07 NOTE — Telephone Encounter (Signed)
Scheduled appt per 9/12 los - gave patient AVS and calender per los.   

## 2018-04-07 NOTE — Progress Notes (Signed)
[facility] OFFICE PROGRESS NOTE   Diagnosis: Lymphoma, prostate cancer  INTERVAL HISTORY:   Edward Woodward returns as scheduled.  He continues Zytiga and abiraterone.  Good appetite.  He had a recent skin tear at his elbow.  He had discomfort at the right "hip "and was evaluated by Dr. Inda Merlin.  This has resolved.  Objective:  Vital signs in last 24 hours:  Blood pressure (!) 129/59, pulse 65, temperature 97.9 F (36.6 C), temperature source Oral, resp. rate 18, height 5\' 10"  (1.778 m), weight 186 lb 6.4 oz (84.6 kg), SpO2 100 %.    HEENT: Neck without mass Lymphatics: No cervical, supraclavicular, axillary, or inguinal nodes Resp: Lungs clear bilaterally Cardio: Regular rate and rhythm GI: No hepatospleno megaly, nontender Vascular: No leg edema   Lab Results:  Lab Results  Component Value Date   WBC 6.7 04/07/2018   HGB 11.1 (L) 04/07/2018   HCT 33.4 (L) 04/07/2018   MCV 93.3 04/07/2018   PLT 138 (L) 04/07/2018   NEUTROABS 4.8 04/07/2018    CMP  Lab Results  Component Value Date   NA 140 01/06/2018   K 4.3 01/06/2018   CL 106 01/06/2018   CO2 26 01/06/2018   GLUCOSE 101 01/06/2018   BUN 27 (H) 01/06/2018   CREATININE 1.53 (H) 01/06/2018   CALCIUM 10.0 01/06/2018   PROT 6.4 01/06/2018   ALBUMIN 3.6 01/06/2018   AST 19 01/06/2018   ALT 15 01/06/2018   ALKPHOS 81 01/06/2018   BILITOT 0.3 01/06/2018   GFRNONAA 39 (L) 01/06/2018   GFRAA 45 (L) 01/06/2018      Lab Results  Component Value Date   INR 2.38 04/07/2018    Medications: I have reviewed the patient's current medications.   Assessment/Plan: 1. Non-Hodgkin's lymphoma (follicular grade 3 lymphoma) - status post 6 cycles of Cytoxan/prednisone/rituximab 07/01/2007 through 10/21/2007.   Biopsy of a retroperitoneal mass 05/13/2016 consistent with involvement by low-grade non-Hodgkin's lymphoma  Initiation of weekly Rituxan 06/17/2016; week 4given 07/09/2016.  CT abdomen/pelvis  09/15/2016-improvement in retroperitoneal and pelvic soft tissue  Initiation of maintenance Rituxan every 3 months 10/13/2016 (2 year course planned)  CTs 08/25/2017-new left axillary lymph node, enlargement of left pleural effusion, less masslike soft tissue in the mid retroperitoneum, new areas of retroperitoneal and pelvic lymphadenopathy. Mildlyenlarged neck lymph nodes  Cycle 1 bendamustine 09/09/2017  Cycle 2 bendamustine 10/06/2017  Cycle 3 bendamustine 11/11/2017  CTs 12/02/2017- resolution of left axillary adenopathy, decreased celiac adenopathy, unchanged infiltrative retroperitoneal adenopathy, improvement in pelvic sidewall and right inguinal adenopathy 2. CT chest 04/01/2015 with no lymphadenopathy  CT abdomen/pelvis 05/01/2015 with mild progression of abdomen/pelvic lymphadenopathy 3. Prostate cancer - he has been treated with hormonal therapy, the PSA was stable on 10/15/2014  PSA increased 04/26/2015   Bone scan 05/09/2015 with unchanged increased activity in the thoracic and lumbar spine felt to most likely be degenerative.   Initiation of Abiraterone and prednisone 05/11/2015. Normalization of the PSA on Abiraterone. 4. History of a normocytic anemia secondary to non-Hodgkin's lymphoma, chemotherapy, and renal insufficiency - progressive secondary to GI bleeding, stable 5. History of Mild thrombocytopenia  6. History of bilateral hydronephrosis. Renal ultrasound 09/26/2014 consistent with medical renal disease. No hydronephrosis. Ureter stents removed 10/09/2016. 7. Coronary artery disease - status post coronary artery stent placement. 8. History of noncalcified lung nodules. 9. History of severe neutropenia July 2009 - likely related to delayed toxicity from rituximab. 10. Macular degeneration. 11. Right middle lobe density on a CT  of the chest 06/18/2010 measuring 2.5 x 1.6 cm with mildly increased metabolic activity (SUV max 2.5) on a PET scan 06/27/2010. The area  of increased density was less confluent and appeared smaller on a restaging CT 09/08/2010. Right middle lobe "scarring" with no new or suspicious pulmonary nodule on a CT 08/04/2011. 12. Mild increased metabolic activity associated with several lymph nodes on the PET scan 06/27/2010 - likely related to lymphoma. No lymphadenopathy in the mediastinum or axillary areas on the CT 08/04/2011. 13. Right hip discomfort-he received a steroid injection by his primary physician without improvement. He saw Dr. Collier Salina and there was a question of a lytic process in the right pelvis. A bone scan on 09/21/2012 revealed no evidence of metastatic disease. 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 Renal insufficiency-progressive CT 01/12/2016 confirmed progressive hydronephrosis  Placement of bilateral ureter stents 04/10/2016  Ureter stents removed 10/09/2016 15. Left leg DVT and pulmonary embolism 04/01/2015-on Coumadin 16. Admission 08/23/2015 with severe anemia and GI bleeding  Upper and lower endoscopy 08/26/2015 without a clear source for bleeding identified 17. Leg edema/anasarca-improved with leg wrapping and diuretics 18. Anemia secondary to chronic disease, renal failure, and possibly lymphoma 19. 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   Biopsy retroperitoneal soft tissue 05/13/2016 20. History of elevated calcium-most likely secondary to non-Hodgkin's lymphoma status post pamidronate 06/08/2016, calcitonin 06/11/2016    Disposition: Mr. Mutchler appears stable.  The PT/INR is in the therapeutic range.  He will continue Coumadin at the current dose.  We will follow-up on the PSA from today and decide on continuing Zytiga/prednisone.  He received an influenza vaccine today.  He will return for an office visit in 3 months.  [name], MD  04/07/2018  11:31  AM

## 2018-04-08 LAB — PROSTATE-SPECIFIC AG, SERUM (LABCORP): PROSTATE SPECIFIC AG, SERUM: 4.7 ng/mL — AB (ref 0.0–4.0)

## 2018-04-18 ENCOUNTER — Other Ambulatory Visit: Payer: Self-pay | Admitting: Emergency Medicine

## 2018-04-18 DIAGNOSIS — Z7901 Long term (current) use of anticoagulants: Secondary | ICD-10-CM

## 2018-04-18 MED ORDER — WARFARIN SODIUM 5 MG PO TABS: 5.0000 mg | ORAL_TABLET | Freq: Every day | ORAL | 1 refills | Status: DC

## 2018-04-19 ENCOUNTER — Encounter: Payer: Self-pay | Admitting: Oncology

## 2018-04-25 ENCOUNTER — Other Ambulatory Visit: Payer: Self-pay | Admitting: Oncology

## 2018-04-25 DIAGNOSIS — C61 Malignant neoplasm of prostate: Secondary | ICD-10-CM

## 2018-05-02 ENCOUNTER — Telehealth: Payer: Self-pay

## 2018-05-02 NOTE — Telephone Encounter (Addendum)
Oral Oncology Patient Advocate Encounter  I received a fax this morning stating that Zytiga approval has been extended to 07-27-2019 in an effort to cut down on the number of renewal applications being submitted in December.  I called and spoke to [name], she verbalized understanding and appreciation  [place] [facility] [place] Phone [phone] Fax [phone]

## 2018-05-09 ENCOUNTER — Ambulatory Visit (INDEPENDENT_AMBULATORY_CARE_PROVIDER_SITE_OTHER): Payer: Medicare Other | Admitting: *Deleted

## 2018-05-09 DIAGNOSIS — I495 Sick sinus syndrome: Secondary | ICD-10-CM | POA: Diagnosis not present

## 2018-05-09 NOTE — Progress Notes (Signed)
Remote pacemaker transmission.   

## 2018-05-12 ENCOUNTER — Encounter: Payer: Self-pay | Admitting: Cardiology

## 2018-05-19 ENCOUNTER — Other Ambulatory Visit: Payer: Self-pay | Admitting: *Deleted

## 2018-05-19 DIAGNOSIS — Z7901 Long term (current) use of anticoagulants: Secondary | ICD-10-CM

## 2018-05-19 MED ORDER — WARFARIN SODIUM 5 MG PO TABS: 5.0000 mg | ORAL_TABLET | Freq: Every day | ORAL | 1 refills | Status: DC

## 2018-06-01 LAB — CUP PACEART REMOTE DEVICE CHECK
Implantable Lead Implant Date: 20081013
Implantable Lead Location: 753859
Implantable Lead Model: 4457
Implantable Lead Model: 4470
Implantable Lead Serial Number: 205344
Implantable Lead Serial Number: 210110
Implantable Pulse Generator Implant Date: 20181214
MDC IDC LEAD IMPLANT DT: 20081013
MDC IDC LEAD LOCATION: 753860
MDC IDC PG SERIAL: 377798
MDC IDC SESS DTM: 20191106130846

## 2018-06-06 DIAGNOSIS — N189 Chronic kidney disease, unspecified: Secondary | ICD-10-CM | POA: Diagnosis not present

## 2018-06-06 DIAGNOSIS — I739 Peripheral vascular disease, unspecified: Secondary | ICD-10-CM | POA: Diagnosis not present

## 2018-06-06 DIAGNOSIS — C61 Malignant neoplasm of prostate: Secondary | ICD-10-CM | POA: Diagnosis not present

## 2018-06-06 DIAGNOSIS — I129 Hypertensive chronic kidney disease with stage 1 through stage 4 chronic kidney disease, or unspecified chronic kidney disease: Secondary | ICD-10-CM | POA: Diagnosis not present

## 2018-06-06 DIAGNOSIS — Z950 Presence of cardiac pacemaker: Secondary | ICD-10-CM | POA: Diagnosis not present

## 2018-06-06 DIAGNOSIS — D631 Anemia in chronic kidney disease: Secondary | ICD-10-CM | POA: Diagnosis not present

## 2018-06-06 DIAGNOSIS — N2581 Secondary hyperparathyroidism of renal origin: Secondary | ICD-10-CM | POA: Diagnosis not present

## 2018-06-06 DIAGNOSIS — N139 Obstructive and reflux uropathy, unspecified: Secondary | ICD-10-CM | POA: Diagnosis not present

## 2018-06-06 DIAGNOSIS — I2510 Atherosclerotic heart disease of native coronary artery without angina pectoris: Secondary | ICD-10-CM | POA: Diagnosis not present

## 2018-06-06 DIAGNOSIS — I495 Sick sinus syndrome: Secondary | ICD-10-CM | POA: Diagnosis not present

## 2018-06-06 DIAGNOSIS — E7800 Pure hypercholesterolemia, unspecified: Secondary | ICD-10-CM | POA: Diagnosis not present

## 2018-06-06 DIAGNOSIS — I5032 Chronic diastolic (congestive) heart failure: Secondary | ICD-10-CM | POA: Diagnosis not present

## 2018-06-06 DIAGNOSIS — N183 Chronic kidney disease, stage 3 (moderate): Secondary | ICD-10-CM | POA: Diagnosis not present

## 2018-06-17 ENCOUNTER — Other Ambulatory Visit: Payer: Self-pay | Admitting: *Deleted

## 2018-06-17 DIAGNOSIS — Z7901 Long term (current) use of anticoagulants: Secondary | ICD-10-CM

## 2018-06-17 MED ORDER — WARFARIN SODIUM 5 MG PO TABS: 5.0000 mg | ORAL_TABLET | Freq: Every day | ORAL | 2 refills | Status: DC

## 2018-06-28 ENCOUNTER — Other Ambulatory Visit: Payer: Self-pay | Admitting: *Deleted

## 2018-06-28 DIAGNOSIS — C61 Malignant neoplasm of prostate: Secondary | ICD-10-CM

## 2018-06-28 MED ORDER — PREDNISONE 5 MG PO TABS: 5.0000 mg | ORAL_TABLET | Freq: Every day | ORAL | 0 refills | Status: DC

## 2018-07-07 ENCOUNTER — Telehealth: Payer: Self-pay

## 2018-07-07 ENCOUNTER — Inpatient Hospital Stay: Payer: Medicare Other | Attending: Oncology | Admitting: Nurse Practitioner

## 2018-07-07 ENCOUNTER — Inpatient Hospital Stay: Payer: Medicare Other

## 2018-07-07 ENCOUNTER — Encounter: Payer: Self-pay | Admitting: Nurse Practitioner

## 2018-07-07 VITALS — BP 146/72 | HR 65 | Temp 97.6°F | Resp 17 | Ht 70.0 in | Wt 194.4 lb

## 2018-07-07 DIAGNOSIS — Z86718 Personal history of other venous thrombosis and embolism: Secondary | ICD-10-CM | POA: Insufficient documentation

## 2018-07-07 DIAGNOSIS — Z9221 Personal history of antineoplastic chemotherapy: Secondary | ICD-10-CM

## 2018-07-07 DIAGNOSIS — Z86711 Personal history of pulmonary embolism: Secondary | ICD-10-CM | POA: Insufficient documentation

## 2018-07-07 DIAGNOSIS — D696 Thrombocytopenia, unspecified: Secondary | ICD-10-CM | POA: Diagnosis not present

## 2018-07-07 DIAGNOSIS — C61 Malignant neoplasm of prostate: Secondary | ICD-10-CM | POA: Diagnosis not present

## 2018-07-07 DIAGNOSIS — C8299 Follicular lymphoma, unspecified, extranodal and solid organ sites: Secondary | ICD-10-CM | POA: Insufficient documentation

## 2018-07-07 DIAGNOSIS — Z7901 Long term (current) use of anticoagulants: Secondary | ICD-10-CM | POA: Insufficient documentation

## 2018-07-07 DIAGNOSIS — D649 Anemia, unspecified: Secondary | ICD-10-CM | POA: Diagnosis not present

## 2018-07-07 DIAGNOSIS — I2510 Atherosclerotic heart disease of native coronary artery without angina pectoris: Secondary | ICD-10-CM | POA: Diagnosis not present

## 2018-07-07 DIAGNOSIS — H3530 Unspecified macular degeneration: Secondary | ICD-10-CM | POA: Insufficient documentation

## 2018-07-07 LAB — CBC WITH DIFFERENTIAL (CANCER CENTER ONLY)
ABS IMMATURE GRANULOCYTES: 0.03 10*3/uL (ref 0.00–0.07)
BASOS ABS: 0 10*3/uL (ref 0.0–0.1)
Basophils Relative: 0 %
EOS ABS: 0.4 10*3/uL (ref 0.0–0.5)
Eosinophils Relative: 6 %
HEMATOCRIT: 34.3 % — AB (ref 39.0–52.0)
HEMOGLOBIN: 11.2 g/dL — AB (ref 13.0–17.0)
Immature Granulocytes: 0 %
LYMPHS PCT: 11 %
Lymphs Abs: 0.8 10*3/uL (ref 0.7–4.0)
MCH: 30.9 pg (ref 26.0–34.0)
MCHC: 32.7 g/dL (ref 30.0–36.0)
MCV: 94.8 fL (ref 80.0–100.0)
MONO ABS: 0.5 10*3/uL (ref 0.1–1.0)
MONOS PCT: 8 %
NEUTROS ABS: 5.3 10*3/uL (ref 1.7–7.7)
Neutrophils Relative %: 75 %
Platelet Count: 152 10*3/uL (ref 150–400)
RBC: 3.62 MIL/uL — ABNORMAL LOW (ref 4.22–5.81)
RDW: 14.5 % (ref 11.5–15.5)
WBC: 7.1 10*3/uL (ref 4.0–10.5)
nRBC: 0 % (ref 0.0–0.2)

## 2018-07-07 LAB — CMP (CANCER CENTER ONLY)
ALT: 7 U/L (ref 0–44)
AST: 13 U/L — ABNORMAL LOW (ref 15–41)
Albumin: 3.5 g/dL (ref 3.5–5.0)
Alkaline Phosphatase: 71 U/L (ref 38–126)
Anion gap: 10 (ref 5–15)
BUN: 22 mg/dL (ref 8–23)
CHLORIDE: 109 mmol/L (ref 98–111)
CO2: 24 mmol/L (ref 22–32)
CREATININE: 1.49 mg/dL — AB (ref 0.61–1.24)
Calcium: 9.6 mg/dL (ref 8.9–10.3)
GFR, EST NON AFRICAN AMERICAN: 41 mL/min — AB (ref >60)
GFR, Est AFR Am: 48 mL/min — ABNORMAL LOW (ref >60)
Glucose, Bld: 113 mg/dL — ABNORMAL HIGH (ref 70–99)
Potassium: 3.9 mmol/L (ref 3.5–5.1)
Sodium: 143 mmol/L (ref 135–145)
Total Bilirubin: 0.5 mg/dL (ref 0.3–1.2)
Total Protein: 6.5 g/dL (ref 6.5–8.1)

## 2018-07-07 LAB — PROTIME-INR
INR: 3.37
PROTHROMBIN TIME: 33.6 s — AB (ref 11.4–15.2)

## 2018-07-07 NOTE — Telephone Encounter (Signed)
Printed avs and calender of upcoming appointment. Per 12/12 los 

## 2018-07-07 NOTE — Progress Notes (Addendum)
[place] OFFICE PROGRESS NOTE   Diagnosis: Lymphoma, prostate cancer  INTERVAL HISTORY:   Edward Woodward returns as scheduled.  He continues Zytiga and abiraterone.  No interim illnesses or infections.  He denies bleeding.  He has felt "weak" this week.  No fevers or sweats.  Stable neuropathy pain.  3 weeks ago he had an episode of back pain.  No nausea or vomiting.  No change in baseline bowel habits.  Objective:  Vital signs in last 24 hours:  Blood pressure (!) 146/72, pulse 65, temperature 97.6 F (36.4 C), temperature source Oral, resp. rate 17, height 5\' 10"  (1.778 m), weight 194 lb 6.4 oz (88.2 kg), SpO2 100 %.    HEENT: No thrush or ulcers. Lymphatics: No palpable cervical, supraclavicular or axillary lymph nodes. Resp: Lungs clear bilaterally. Cardio: Regular rate and rhythm. GI: Abdomen soft and nontender.  No hepatosplenomegaly. Vascular: No leg edema.   Lab Results:  Lab Results  Component Value Date   WBC 7.1 07/07/2018   HGB 11.2 (L) 07/07/2018   HCT 34.3 (L) 07/07/2018   MCV 94.8 07/07/2018   PLT 152 07/07/2018   NEUTROABS 5.3 07/07/2018    Imaging:  No results found.  Medications: I have reviewed the patient's current medications.  Assessment/Plan: 1. Non-Hodgkin's lymphoma (follicular grade 3 lymphoma) - status post 6 cycles of Cytoxan/prednisone/rituximab 07/01/2007 through 10/21/2007.   Biopsy of a retroperitoneal mass 05/13/2016 consistent with involvement by low-grade non-Hodgkin's lymphoma  Initiation of weekly Rituxan 06/17/2016; week 4given 07/09/2016.  CT abdomen/pelvis 09/15/2016-improvement in retroperitoneal and pelvic soft tissue  Initiation of maintenance Rituxan every 3 months 10/13/2016 (2 year course planned)  CTs 08/25/2017-new left axillary lymph node, enlargement of left pleural effusion, less masslike soft tissue in the mid retroperitoneum, new areas of retroperitoneal and pelvic lymphadenopathy.  Mildlyenlarged neck lymph nodes  Cycle 1 bendamustine 09/09/2017  Cycle 2 bendamustine 10/06/2017  Cycle 3 bendamustine 11/11/2017  CTs 12/02/2017-resolution of left axillary adenopathy, decreased celiac adenopathy, unchanged infiltrative retroperitoneal adenopathy, improvement in pelvic sidewall and right inguinal adenopathy 2. CT chest 04/01/2015 with no lymphadenopathy  CT abdomen/pelvis 05/01/2015 with mild progression of abdomen/pelvic lymphadenopathy 3. Prostate cancer - he has been treated with hormonal therapy, the PSA was stable on 10/15/2014  PSA increased 04/26/2015   Bone scan 05/09/2015 with unchanged increased activity in the thoracic and lumbar spine felt to most likely be degenerative.   Initiation of Abiraterone and prednisone 05/11/2015. Normalization of the PSA on Abiraterone. 4. History of a normocytic anemia secondary to non-Hodgkin's lymphoma, chemotherapy, and renal insufficiency - progressive secondary to GI bleeding, stable 5. History of Mild thrombocytopenia  6. History of bilateral hydronephrosis. Renal ultrasound 09/26/2014 consistent with medical renal disease. No hydronephrosis. Ureter stents removed 10/09/2016. 7. Coronary artery disease - status post coronary artery stent placement. 8. History of noncalcified lung nodules. 9. History of severe neutropenia July 2009 - likely related to delayed toxicity from rituximab. 10. Macular degeneration. 11. Right middle lobe density on a CT of the chest 06/18/2010 measuring 2.5 x 1.6 cm with mildly increased metabolic activity (SUV max 2.5) on a PET scan 06/27/2010. The area of increased density was less confluent and appeared smaller on a restaging CT 09/08/2010. Right middle lobe "scarring" with no new or suspicious pulmonary nodule on a CT 08/04/2011. 12. Mild increased metabolic activity associated with several lymph nodes on the PET scan 06/27/2010 - likely related to lymphoma. No lymphadenopathy in the  mediastinum or axillary areas on the CT  08/04/2011. 13. Right hip discomfort-he received a steroid injection by his primary physician without improvement. He saw Dr. Collier Salina and there was a question of a lytic process in the right pelvis. A bone scan on 09/21/2012 revealed no evidence of metastatic disease. 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 Renal insufficiency-progressive CT 01/12/2016 confirmed progressive hydronephrosis  Placement of bilateral ureter stents 04/10/2016  Ureter stents removed 10/09/2016 15. Left leg DVT and pulmonary embolism 04/01/2015-on Coumadin 16. Admission 08/23/2015 with severe anemia and GI bleeding  Upper and lower endoscopy 08/26/2015 without a clear source for bleeding identified 17. Leg edema/anasarca-improved with leg wrapping and diuretics 18. Anemia secondary to chronic disease, renal failure, and possibly lymphoma 19. 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   Biopsy retroperitoneal soft tissue 05/13/2016 20. History of elevated calcium-most likely secondary to non-Hodgkin's lymphoma status post pamidronate 06/08/2016, calcitonin 06/11/2016     Disposition: Edward Woodward appears unchanged.  The PT/INR is mildly supratherapeutic.  He will hold tonight's dose and then resume Coumadin 5 mg all days except 2.5 mg on Mondays and Thursdays.  He will return for a follow-up PT/INR in 3 weeks.  He will contact the office in the interim with any bleeding.  He continue Zytiga/prednisone.  We will follow-up on the PSA from today.  He will return for lab and a follow-up visit in 3 months.  Patient seen with Edward Woodward.    Edward Woodward ANP/GNP-BC   07/07/2018  12:20 PM   This was a shared visit with Edward Woodward.  Edward Woodward appears unchanged.  He will return for a PT/INR in 3 weeks.  He is scheduled for a 42-month office  visit.  He continues Zytiga and prednisone for the metastatic prostate cancer.  Edward Manson, MD

## 2018-07-08 LAB — PROSTATE-SPECIFIC AG, SERUM (LABCORP): Prostate Specific Ag, Serum: 7.9 ng/mL — ABNORMAL HIGH (ref 0.0–4.0)

## 2018-07-11 ENCOUNTER — Ambulatory Visit: Payer: Medicare Other | Admitting: Neurology

## 2018-07-22 ENCOUNTER — Telehealth: Payer: Self-pay | Admitting: *Deleted

## 2018-07-22 NOTE — Telephone Encounter (Signed)
TC from pt's wife stating Edward Woodward started having blood in his urine today-bright red. No clots.  She states that his last PT/INR (07/07/18) was elevated slightly and his dose of coumadin was held that day, thereafter he resumed his regular schedule of 5 mg 5 days out of the week and 2.5 mg on Mondays and Thursdays. She is asking what they should do at this point. Spoke to [employer], [place] as Dr. Benay Spice is not in the office at this time.  His instructions were to hold the coumadin tonight and Saturday night, then resume regular schedule on Sunday night. Pt is due back for labs again on 07/28/18  TCT back to wife and explained the above. She voiced understanding and a good comfort level with these instructions.  She states pt does not feel bad in anyway. Denies fever, dysuria. Eating and drinking fluids well.  She understands to call back with any questions or concerns and to go to ED over the weekend is bleeding gets worse.

## 2018-07-25 ENCOUNTER — Encounter: Payer: Self-pay | Admitting: Neurology

## 2018-07-25 ENCOUNTER — Ambulatory Visit (INDEPENDENT_AMBULATORY_CARE_PROVIDER_SITE_OTHER): Payer: Medicare Other | Admitting: Neurology

## 2018-07-25 ENCOUNTER — Telehealth: Payer: Self-pay | Admitting: *Deleted

## 2018-07-25 VITALS — BP 110/70 | HR 58 | Ht 70.0 in | Wt 198.5 lb

## 2018-07-25 DIAGNOSIS — F0390 Unspecified dementia without behavioral disturbance: Secondary | ICD-10-CM | POA: Diagnosis not present

## 2018-07-25 DIAGNOSIS — G629 Polyneuropathy, unspecified: Secondary | ICD-10-CM | POA: Diagnosis not present

## 2018-07-25 DIAGNOSIS — G253 Myoclonus: Secondary | ICD-10-CM

## 2018-07-25 MED ORDER — CLONAZEPAM 0.125 MG PO TBDP: ORAL_TABLET | ORAL | 5 refills | Status: DC

## 2018-07-25 NOTE — Patient Instructions (Addendum)
You can try over the counter lidocaine ointment to the foot as needed for pain  Please call my office to set up EEG, if the leg movements get worse  Return to clinic in 6 months

## 2018-07-25 NOTE — Telephone Encounter (Signed)
"  Hello, I'm calling to leave a message for Triage nurse.  This is Finlee Milo calling regarding my husband Edward Woodward d.o.b. 02-21-2027.  Triage had asked [place] to call to see if taking him off of coumadin for two nights would stop the internal bleeding; or the bleeding in the urine and it seems to have worked.  So just make sure that that is recorded in triage please.  Thank you."

## 2018-07-25 NOTE — Progress Notes (Addendum)
Follow-up Visit   Date: 07/25/18    Edward Woodward MRN: [id number] DOB: [birth year]   Interim History: Edward Woodward is a 84 y.o. right-handed Caucasian male with prostate cancer, non-Hodgkin's lymphoma on chemotherapy, depression, CAD status post CABG, atrial fibrillation on Coumadin, sinus node dysfunction s/p PPM, history of DVT and PE, and lumbar surgery for stenosis returning to the clinic for follow-up of myoclonic jerks and dementia.  The patient was accompanied to the clinic by wife who also provides collateral information.    History of present illness: Since early 2016, patient has started to require greater assistance for ADLs. He was previously walking with a rollator and able to bath himself.  In mid-June 2017, patient condition progressed where he was unable to walk, began falling more, hallucinating, limbs jerking, and felt increasingly weak.  He was admitted to [facility] on 6/18 -6/23 for confusion, arm shaking, and poor PO intake.  Mental status change was secondary to toxic-metabolic in setting of electrolyte derangements (hypokalemia) and acute kidney injury.  CT of the abdomen was remarkable for interval progression of a retroperitoneal/periaortic mass with encasement of the aorta suggestive of retroperitoneal fibrosis, adenopathy, or lymphoma. Also noted on CT abdomen is bilateral hydronephrosis which has worsened since the prior study and is likely secondary to compression of the proximal ureters by the aforementioned mass which was later stented. He was treated conservatively, repleted with electrotyles, and given IV hydration.  He eventually showed improvement and was discharged back to SNF. He continues to have jerking movements of the hands, but overall significantly improved from before.  He noticed marked improvement of his myoclonic jerks since being on clonazepam 0.25mg  bid and now only has intermittent hand tremors.   He has previously been evaluated at  [facility] in 2006 at which time he was diagnosed by EMG with multiple chronic severe radiculopathies with evidence of ongoing denervation in the bilateral gastrocnemius muscles and superimposed mild axonal polyneuropathy. There was concern for hardware slippage from his prior lumbar surgery and he was evaluated by his neurosurgeon, Dr. Ellene Route in [place], and was told he had 2 screws that were loose. He elected not to undergo further surgery at that time. More recently, he was evaluated in 2013 for falls and imbalance and diagnosed with idiopathic neuropathy and due to clinical symptoms suggestive of Parkinson's disease, also offered a trail of sinemet 25/100mg  TID.   UPDATE 01/07/2018:  He is here for 6 month follow-up visit and reports doing well.  His wife feels that he has not had any setbacks, no illnesses, or hospitalizations.  His muscle jerks remain controlled on clonazepam and there have been no confusional spells.  He is able to bathe and feed himself and requires some assistance with dressing.  He uses a walker at home and wheelchair for long distances. His balance has progressively worsened causing 3 falls over the past few months.  UPDATE 07/25/2018:  He is here for 6 month follow-up visit.  Overall, he has been doing well and myoclonic jerks are well controlled on clonazepam 0.125mg  - half tab in the morning and 1 tab at bedtime.  He complaints of sharp pain over the right heel and lateral foot, which is worse at night time and aggravated by the bed sheets touching his feet.  He has also had a few spells of uncontrolled jerks of the right lower extremity, which usually occurs when he is sitting and resting.  There is no associated loss  of consciousness.  Wife states that his delirium spells have improved and occur much less frequently.     Medications:  Current Outpatient Medications on File Prior to Visit  Medication Sig Dispense Refill  . acetaminophen (TYLENOL) 500  MG tablet Take 1,000 mg by mouth every 6 (six) hours as needed for mild pain, moderate pain or headache.     . citalopram (CELEXA) 20 MG tablet Take 10 mg by mouth daily.     . clonazepam (KLONOPIN) 0.125 MG disintegrating tablet Take 1 tablet in the morning and half-tablet at bedtime. 45 tablet 5  . hydrocortisone 2.5 % ointment Apply 1 application topically 2 (two) times daily as needed. Applied to affected area(s) of skin( pressure sores)    . Melatonin 3 MG TABS Take 3 mg by mouth at bedtime.    . metoprolol succinate (TOPROL XL) 25 MG 24 hr tablet Take 1 tablet (25 mg total) by mouth daily. 90 tablet 3  . nitroGLYCERIN (NITROLINGUAL) 0.4 MG/SPRAY spray Place 1 spray under the tongue every 5 (five) minutes x 3 doses as needed for chest pain. 12 g 3  . ondansetron (ZOFRAN) 4 MG tablet Take 1 tablet (4 mg total) by mouth every 8 (eight) hours as needed for nausea or vomiting. 20 tablet 0  . Polyethyl Glycol-Propyl Glycol (SYSTANE) 0.4-0.3 % SOLN Place 1 drop into both eyes at bedtime.     . polyethylene glycol (MIRALAX / GLYCOLAX) packet Take 17 g by mouth daily as needed for moderate constipation.     . predniSONE (DELTASONE) 5 MG tablet Take 1 tablet (5 mg total) by mouth daily. 90 tablet 0  . torsemide (DEMADEX) 20 MG tablet Take 20 mg by mouth as needed (for swelling (edema)).    . vitamin B-12 (CYANOCOBALAMIN) 1000 MCG tablet Take 1,000 mcg by mouth at bedtime.     Marland Kitchen warfarin (COUMADIN) 5 MG tablet Take 1 tablet (5 mg total) by mouth daily at 6 PM. Except every M,Th take half tablet (2.5 mg) 30 tablet 2  . ZYTIGA 250 MG tablet TAKE 4 TABLETS BY MOUTH ONCE DAILY AS DIRECTED.  TAKE 1 HOUR BEFORE OR 2 HOURS AFTER A MEAL 120 tablet 11   No current facility-administered medications on file prior to visit.     Allergies:  Allergies  Allergen Reactions  . Penicillins Hives, Shortness Of Breath and Other (See Comments)    Has patient had a PCN reaction causing immediate rash,  facial/tongue/throat swelling, SOB or lightheadedness with hypotension: Yes Has patient had a PCN reaction causing severe rash involving mucus membranes or skin necrosis: No Has patient had a PCN reaction that required hospitalization No Has patient had a PCN reaction occurring within the last 10 years: No If all of the above answers are "NO", then may proceed with Cephalosporin use.   . Simvastatin Other (See Comments)    Reaction:  Muscle weakness    Review of Systems:  CONSTITUTIONAL: No fevers, chills, night sweats, or weight loss.  EYES: No visual changes or eye pain ENT: No hearing changes.  No history of nose bleeds.   RESPIRATORY: No cough, wheezing and shortness of breath.   CARDIOVASCULAR: Negative for chest pain, and palpitations.   GI: Negative for abdominal discomfort, blood in stools or black stools.  No recent change in bowel habits.   GU:  No history of incontinence.   MUSCLOSKELETAL: No history of joint pain or swelling.  No myalgias.   SKIN: Negative for lesions, rash, and  itching.   ENDOCRINE: Negative for cold or heat intolerance, polydipsia or goiter.   PSYCH:  No depression or anxiety symptoms.   NEURO: As Above.   Vital Signs:  BP 110/70   Pulse (!) 58   Ht 5\' 10"  (1.778 m)   Wt 198 lb 8 oz (90 kg)   SpO2 97%   BMI 28.48 kg/m   General Medical Exam:   General:  Frail appearing, comfortable, sitting in wheelchair  Eyes/ENT: see cranial nerve examination.   Neck:  No carotid bruits. Respiratory:  Clear to auscultation, good air entry bilaterally.   Cardiac:  Regular rate and rhythm, no murmur.   Ext:  No edema  Neurological Exam: MENTAL STATUS including orientation to time, place, person, recent and remote memory, attention span and concentration, language, and fund of knowledge is fair.  Affect is blunted, but he smiles appropriately.  He is very slow to respond to questions in simple commands, offering needing to repeat several times  CRANIAL  NERVES:  Pupils are round and reactive. Extraocular muscles intact, mild upgaze restriction.  Face is symmetric.   MOTOR:  Motor strength is 5/5 in all extremities, including hip flexors.  No tremors or rigidity.  COORDINATION/GAIT: Generalized bradykinesia throughout.  Gait is not tested as patient is in wheelchair.    Data: CT head 01/12/2016: No acute intracranial hemorrhage. Age-related atrophy and chronic microvascular ischemic disease.   IMPRESSION/PLAN: 1.    Right foot pain and paresthesias most likely due to worsening peripheral neuropathy.  We discussed management options including adding very low-dose gabapentin 100 mg at bedtime.  The medication was declined due to concern of cognitive side effects and his history of confusional spells.  He was offered over-the-counter lidocaine ointment to try as needed.    2.   Intermittent spells of right leg jerking with retained consciousness, less likely seizure disorder and may be associated with myoclonic jerks.  EEG was declined.  Patient will reconsider if symptoms get worse.    3.  Multifocal myoclonic jerks, improved on clonazepam 0.125 mg - half tab in the morning and 1 tab qhs.  4.  Functional debility and generalized bradykinesia.  Possibility of Parkinson's disease has been raised in the past he has been given a trial of Sinemet with no response.  Exam does not show tremor or rigidity.  He has severely blunted affect and generalized slow movements.   5.  Dementia without behavior disturbance.  If his clinical status change to favor Parkinson's disease, Lewy body dementia will need to be considered.  Overall clinically stable.  He is able to perform basic ADLs independently.  Return to clinic in 6 months   Thank you for allowing me to participate in patient's care.  If I can answer any additional questions, I would be pleased to do so.    Sincerely,    Audianna Landgren K. Posey Pronto, DO

## 2018-07-25 NOTE — Addendum Note (Signed)
Addended by: Alda Berthold on: 07/25/2018 12:59 PM   Modules accepted: Orders

## 2018-07-28 ENCOUNTER — Other Ambulatory Visit: Payer: Self-pay | Admitting: Nurse Practitioner

## 2018-07-28 ENCOUNTER — Inpatient Hospital Stay: Payer: Medicare Other | Attending: Oncology

## 2018-07-28 ENCOUNTER — Telehealth: Payer: Self-pay | Admitting: *Deleted

## 2018-07-28 DIAGNOSIS — Z7901 Long term (current) use of anticoagulants: Secondary | ICD-10-CM

## 2018-07-28 DIAGNOSIS — Z86711 Personal history of pulmonary embolism: Secondary | ICD-10-CM | POA: Diagnosis not present

## 2018-07-28 DIAGNOSIS — Z86718 Personal history of other venous thrombosis and embolism: Secondary | ICD-10-CM | POA: Diagnosis not present

## 2018-07-28 DIAGNOSIS — I2699 Other pulmonary embolism without acute cor pulmonale: Secondary | ICD-10-CM

## 2018-07-28 LAB — PROTIME-INR
INR: 3.29
PROTHROMBIN TIME: 33 s — AB (ref 11.4–15.2)

## 2018-07-28 NOTE — Telephone Encounter (Signed)
Per [name], NP call patient wife to advise that patient should decrease Coumadin from 5mg  to 2.5 mg daily.  We will repeat labs in 2 weeks.

## 2018-08-01 ENCOUNTER — Telehealth: Payer: Self-pay | Admitting: Oncology

## 2018-08-01 ENCOUNTER — Telehealth: Payer: Self-pay | Admitting: *Deleted

## 2018-08-01 NOTE — Telephone Encounter (Signed)
Spoke with wife re 1/16 lab.

## 2018-08-01 NOTE — Telephone Encounter (Signed)
Is he taking Demadex/? Could be UTI or BPH symptoms Check urinalysis, if negative he can see urology

## 2018-08-01 NOTE — Telephone Encounter (Signed)
Wife called and left VM with concern that his PSA is going up. Reports he is up to void at least 5 times during the night and he as well as herself are not getting any rest. Is there anything that can be done? He has no blood in his urine. Could he have a UTI? She is trying to push oral fluids on him as much as possible.

## 2018-08-02 NOTE — Telephone Encounter (Signed)
Wife started the Demadex and gives it to him early in the morning. She doubts it is a UTI. Thinks it is enlarged prostate issue. Instructed her to call urologist. She understands and agrees.

## 2018-08-05 DIAGNOSIS — R310 Gross hematuria: Secondary | ICD-10-CM | POA: Diagnosis not present

## 2018-08-05 DIAGNOSIS — R350 Frequency of micturition: Secondary | ICD-10-CM | POA: Diagnosis not present

## 2018-08-08 ENCOUNTER — Ambulatory Visit (INDEPENDENT_AMBULATORY_CARE_PROVIDER_SITE_OTHER): Payer: Medicare Other

## 2018-08-08 DIAGNOSIS — I495 Sick sinus syndrome: Secondary | ICD-10-CM

## 2018-08-09 NOTE — Progress Notes (Signed)
Remote pacemaker transmission.   

## 2018-08-10 LAB — CUP PACEART REMOTE DEVICE CHECK
Battery Remaining Longevity: 78 mo
Battery Remaining Percentage: 100 %
Brady Statistic RA Percent Paced: 87 %
Date Time Interrogation Session: 20200113053000
Implantable Lead Implant Date: 20081013
Implantable Lead Implant Date: 20081013
Implantable Lead Location: 753859
Implantable Lead Location: 753860
Implantable Lead Model: 4457
Implantable Lead Serial Number: 205344
Implantable Lead Serial Number: 210110
Implantable Pulse Generator Implant Date: 20181214
Lead Channel Impedance Value: 450 Ohm
Lead Channel Impedance Value: 518 Ohm
Lead Channel Pacing Threshold Amplitude: 0.4 V
Lead Channel Pacing Threshold Amplitude: 1.4 V
Lead Channel Pacing Threshold Pulse Width: 0.4 ms
Lead Channel Setting Pacing Amplitude: 2 V
Lead Channel Setting Pacing Amplitude: 3 V
Lead Channel Setting Pacing Pulse Width: 0.4 ms
Lead Channel Setting Sensing Sensitivity: 2.5 mV
MDC IDC MSMT LEADCHNL RA PACING THRESHOLD PULSEWIDTH: 0.4 ms
MDC IDC STAT BRADY RV PERCENT PACED: 4 %
Pulse Gen Serial Number: 377798

## 2018-08-11 ENCOUNTER — Inpatient Hospital Stay: Payer: Medicare Other

## 2018-08-11 DIAGNOSIS — Z7901 Long term (current) use of anticoagulants: Secondary | ICD-10-CM

## 2018-08-11 DIAGNOSIS — Z86718 Personal history of other venous thrombosis and embolism: Secondary | ICD-10-CM | POA: Diagnosis not present

## 2018-08-11 DIAGNOSIS — Z86711 Personal history of pulmonary embolism: Secondary | ICD-10-CM | POA: Diagnosis not present

## 2018-08-11 DIAGNOSIS — I2699 Other pulmonary embolism without acute cor pulmonale: Secondary | ICD-10-CM

## 2018-08-11 LAB — PROTIME-INR
INR: 1.54
PROTHROMBIN TIME: 18.3 s — AB (ref 11.4–15.2)

## 2018-08-12 ENCOUNTER — Other Ambulatory Visit: Payer: Self-pay | Admitting: Nurse Practitioner

## 2018-08-12 ENCOUNTER — Telehealth: Payer: Self-pay

## 2018-08-12 ENCOUNTER — Telehealth: Payer: Self-pay | Admitting: Oncology

## 2018-08-12 DIAGNOSIS — Z7901 Long term (current) use of anticoagulants: Secondary | ICD-10-CM

## 2018-08-12 DIAGNOSIS — L821 Other seborrheic keratosis: Secondary | ICD-10-CM | POA: Diagnosis not present

## 2018-08-12 DIAGNOSIS — Z85828 Personal history of other malignant neoplasm of skin: Secondary | ICD-10-CM | POA: Diagnosis not present

## 2018-08-12 DIAGNOSIS — L820 Inflamed seborrheic keratosis: Secondary | ICD-10-CM | POA: Diagnosis not present

## 2018-08-12 DIAGNOSIS — L570 Actinic keratosis: Secondary | ICD-10-CM | POA: Diagnosis not present

## 2018-08-12 NOTE — Telephone Encounter (Signed)
Scheduled appt per 1/17 sch message - wife is aware of appt

## 2018-08-12 NOTE — Telephone Encounter (Signed)
TC to [facility] wife Faith Branan [phone]) per Lattie Haw with instructions for pt to continue the same dose of Coumadin, and to repeat PT/INR in 2weeks.

## 2018-08-25 ENCOUNTER — Inpatient Hospital Stay: Payer: Medicare Other

## 2018-08-25 DIAGNOSIS — Z86711 Personal history of pulmonary embolism: Secondary | ICD-10-CM | POA: Diagnosis not present

## 2018-08-25 DIAGNOSIS — Z7901 Long term (current) use of anticoagulants: Secondary | ICD-10-CM

## 2018-08-25 DIAGNOSIS — Z86718 Personal history of other venous thrombosis and embolism: Secondary | ICD-10-CM | POA: Diagnosis not present

## 2018-08-25 LAB — PROTIME-INR
INR: 1.17
Prothrombin Time: 14.8 seconds (ref 11.4–15.2)

## 2018-08-26 ENCOUNTER — Telehealth: Payer: Self-pay | Admitting: *Deleted

## 2018-08-26 ENCOUNTER — Encounter: Payer: Self-pay | Admitting: Nurse Practitioner

## 2018-08-26 DIAGNOSIS — Z961 Presence of intraocular lens: Secondary | ICD-10-CM | POA: Diagnosis not present

## 2018-08-26 DIAGNOSIS — H353134 Nonexudative age-related macular degeneration, bilateral, advanced atrophic with subfoveal involvement: Secondary | ICD-10-CM | POA: Diagnosis not present

## 2018-08-26 DIAGNOSIS — H35371 Puckering of macula, right eye: Secondary | ICD-10-CM | POA: Diagnosis not present

## 2018-08-26 DIAGNOSIS — Z7901 Long term (current) use of anticoagulants: Secondary | ICD-10-CM

## 2018-08-26 NOTE — Telephone Encounter (Signed)
Opened in error

## 2018-08-26 NOTE — Telephone Encounter (Signed)
Left VM for wife to change his coumadin dose to 2.5 mg daily, except 5 mg on Monday & Thursdays. Will recheck in 2 weeks. Scheduling message sent and requested wife call back to confirm she got the new instructions.

## 2018-08-26 NOTE — Telephone Encounter (Signed)
-----   Message from Owens Shark, NP sent at 08/26/2018 10:40 AM EST ----- Please call wife with instructions to change Coumadin from 2.5 mg daily to 2.5 mg all days except 5 mg on Mondays and Thursdays.  Repeat PT/INR in 2 weeks.

## 2018-08-29 ENCOUNTER — Telehealth: Payer: Self-pay | Admitting: Oncology

## 2018-08-29 NOTE — Telephone Encounter (Signed)
Called patient per 1/31 sch message - unable to reach pt - left message for patient with appt date and time

## 2018-09-06 DIAGNOSIS — R310 Gross hematuria: Secondary | ICD-10-CM | POA: Diagnosis not present

## 2018-09-06 DIAGNOSIS — C61 Malignant neoplasm of prostate: Secondary | ICD-10-CM | POA: Diagnosis not present

## 2018-09-06 DIAGNOSIS — R3911 Hesitancy of micturition: Secondary | ICD-10-CM | POA: Diagnosis not present

## 2018-09-09 ENCOUNTER — Inpatient Hospital Stay: Payer: Medicare Other | Attending: Oncology

## 2018-09-09 ENCOUNTER — Other Ambulatory Visit: Payer: Self-pay | Admitting: Neurology

## 2018-09-09 DIAGNOSIS — Z86718 Personal history of other venous thrombosis and embolism: Secondary | ICD-10-CM | POA: Diagnosis not present

## 2018-09-09 DIAGNOSIS — Z7901 Long term (current) use of anticoagulants: Secondary | ICD-10-CM | POA: Diagnosis not present

## 2018-09-09 LAB — PROTIME-INR
INR: 1.57
Prothrombin Time: 18.6 seconds — ABNORMAL HIGH (ref 11.4–15.2)

## 2018-09-12 ENCOUNTER — Telehealth: Payer: Self-pay

## 2018-09-12 NOTE — Telephone Encounter (Signed)
TC to Pts wife Edward Woodward [phone]) to instruct wife to increase Coumadin to 5 mg every Monday Wednesday Friday, 2.5 mg all other days. Repeat PT/INR in 2 weeks. Wife Verbalized understanding. No further problems or concerns at this time.

## 2018-09-13 ENCOUNTER — Other Ambulatory Visit: Payer: Self-pay | Admitting: *Deleted

## 2018-09-13 DIAGNOSIS — Z7901 Long term (current) use of anticoagulants: Secondary | ICD-10-CM

## 2018-09-13 MED ORDER — WARFARIN SODIUM 5 MG PO TABS: 5.0000 mg | ORAL_TABLET | Freq: Every day | ORAL | 2 refills | Status: DC

## 2018-09-22 ENCOUNTER — Other Ambulatory Visit: Payer: Self-pay | Admitting: *Deleted

## 2018-09-22 DIAGNOSIS — C61 Malignant neoplasm of prostate: Secondary | ICD-10-CM

## 2018-09-22 MED ORDER — PREDNISONE 5 MG PO TABS: 5.0000 mg | ORAL_TABLET | Freq: Every day | ORAL | 0 refills | Status: DC

## 2018-10-06 ENCOUNTER — Telehealth: Payer: Self-pay | Admitting: Oncology

## 2018-10-06 ENCOUNTER — Telehealth: Payer: Self-pay | Admitting: *Deleted

## 2018-10-06 ENCOUNTER — Inpatient Hospital Stay: Payer: Medicare Other

## 2018-10-06 ENCOUNTER — Inpatient Hospital Stay: Payer: Medicare Other | Admitting: Oncology

## 2018-10-06 NOTE — Telephone Encounter (Signed)
I called and spoke with patient's wife regarding appointment for Lab @ [facility] being added as patient has requested.

## 2018-10-06 NOTE — Telephone Encounter (Signed)
Wife canceled appointments today due to fear of being in large crowd due to COVID 19 virus. They are willing to have lab at the [facility] office if Dr. Benay Spice would agree. Confirmed with wife that his March labs can be done at [facility] and then return in late April for lab/OV with Dr. Benay Spice. She agrees to this plan. Scheduling message sent.

## 2018-10-12 ENCOUNTER — Telehealth: Payer: Self-pay | Admitting: *Deleted

## 2018-10-12 ENCOUNTER — Other Ambulatory Visit: Payer: Self-pay

## 2018-10-12 ENCOUNTER — Inpatient Hospital Stay: Payer: Medicare Other | Attending: Oncology

## 2018-10-12 DIAGNOSIS — C61 Malignant neoplasm of prostate: Secondary | ICD-10-CM

## 2018-10-12 DIAGNOSIS — Z86718 Personal history of other venous thrombosis and embolism: Secondary | ICD-10-CM | POA: Diagnosis not present

## 2018-10-12 DIAGNOSIS — Z7901 Long term (current) use of anticoagulants: Secondary | ICD-10-CM | POA: Diagnosis not present

## 2018-10-12 DIAGNOSIS — C8299 Follicular lymphoma, unspecified, extranodal and solid organ sites: Secondary | ICD-10-CM

## 2018-10-12 LAB — CMP (CANCER CENTER ONLY)
ALT: 9 U/L (ref 0–44)
AST: 13 U/L — ABNORMAL LOW (ref 15–41)
Albumin: 4 g/dL (ref 3.5–5.0)
Alkaline Phosphatase: 58 U/L (ref 38–126)
Anion gap: 9 (ref 5–15)
BUN: 39 mg/dL — ABNORMAL HIGH (ref 8–23)
CO2: 25 mmol/L (ref 22–32)
Calcium: 9.8 mg/dL (ref 8.9–10.3)
Chloride: 106 mmol/L (ref 98–111)
Creatinine: 1.5 mg/dL — ABNORMAL HIGH (ref 0.61–1.24)
GFR, EST AFRICAN AMERICAN: 47 mL/min — AB (ref >60)
GFR, EST NON AFRICAN AMERICAN: 41 mL/min — AB (ref >60)
Glucose, Bld: 105 mg/dL — ABNORMAL HIGH (ref 70–99)
Potassium: 5 mmol/L (ref 3.5–5.1)
Sodium: 140 mmol/L (ref 135–145)
Total Bilirubin: 0.4 mg/dL (ref 0.3–1.2)
Total Protein: 6.1 g/dL — ABNORMAL LOW (ref 6.5–8.1)

## 2018-10-12 LAB — CBC WITH DIFFERENTIAL (CANCER CENTER ONLY)
Abs Immature Granulocytes: 0.04 10*3/uL (ref 0.00–0.07)
Basophils Absolute: 0 10*3/uL (ref 0.0–0.1)
Basophils Relative: 0 %
EOS PCT: 2 %
Eosinophils Absolute: 0.1 10*3/uL (ref 0.0–0.5)
HCT: 35.6 % — ABNORMAL LOW (ref 39.0–52.0)
Hemoglobin: 11.2 g/dL — ABNORMAL LOW (ref 13.0–17.0)
Immature Granulocytes: 1 %
Lymphocytes Relative: 11 %
Lymphs Abs: 0.8 10*3/uL (ref 0.7–4.0)
MCH: 29.7 pg (ref 26.0–34.0)
MCHC: 31.5 g/dL (ref 30.0–36.0)
MCV: 94.4 fL (ref 80.0–100.0)
Monocytes Absolute: 0.4 10*3/uL (ref 0.1–1.0)
Monocytes Relative: 6 %
Neutro Abs: 5.8 10*3/uL (ref 1.7–7.7)
Neutrophils Relative %: 80 %
Platelet Count: 119 10*3/uL — ABNORMAL LOW (ref 150–400)
RBC: 3.77 MIL/uL — ABNORMAL LOW (ref 4.22–5.81)
RDW: 14.5 % (ref 11.5–15.5)
WBC Count: 7.1 10*3/uL (ref 4.0–10.5)
nRBC: 0 % (ref 0.0–0.2)

## 2018-10-12 LAB — PROTIME-INR
INR: 2.6 — ABNORMAL HIGH (ref 0.8–1.2)
PROTHROMBIN TIME: 27.3 s — AB (ref 11.4–15.2)

## 2018-10-12 NOTE — Telephone Encounter (Signed)
Wife notified of lab results today and INR. Continue same dose and recheck in 2 weeks (see coumadin flowsheet). Wants labs in [facility] again

## 2018-10-13 ENCOUNTER — Telehealth: Payer: Self-pay | Admitting: Hematology & Oncology

## 2018-10-13 ENCOUNTER — Telehealth: Payer: Self-pay | Admitting: Hematology

## 2018-10-13 LAB — PROSTATE-SPECIFIC AG, SERUM (LABCORP): Prostate Specific Ag, Serum: 8.6 ng/mL — ABNORMAL HIGH (ref 0.0–4.0)

## 2018-10-13 NOTE — Telephone Encounter (Signed)
Appointment scheduled and wife has been notified per 3/18 staff message

## 2018-10-13 NOTE — Telephone Encounter (Signed)
Appointments scheduled patient advised date/time/ letter/calendar mailed per 3/18 los

## 2018-10-26 ENCOUNTER — Other Ambulatory Visit: Payer: Self-pay

## 2018-10-26 ENCOUNTER — Telehealth: Payer: Self-pay | Admitting: *Deleted

## 2018-10-26 ENCOUNTER — Inpatient Hospital Stay: Payer: Medicare Other | Attending: Oncology

## 2018-10-26 DIAGNOSIS — Z7901 Long term (current) use of anticoagulants: Secondary | ICD-10-CM | POA: Diagnosis not present

## 2018-10-26 LAB — PROTIME-INR
INR: 2.6 — ABNORMAL HIGH (ref 0.8–1.2)
Prothrombin Time: 27.1 seconds — ABNORMAL HIGH (ref 11.4–15.2)

## 2018-10-26 NOTE — Telephone Encounter (Signed)
Wife notified to continue same coumadin (see flowsheet).

## 2018-11-01 ENCOUNTER — Telehealth: Payer: Self-pay

## 2018-11-01 NOTE — Telephone Encounter (Signed)
I called and left patient a detailed message asking if he would be able to switch his visit on 11/08/18 to a virtual visit. I see that patient already has mychart.

## 2018-11-02 NOTE — Telephone Encounter (Signed)
Pt's wife has agreed with virtual visit. She states his BP has been running high and would like to discuss with Dr Caryl Comes. Consent has been sent via [employer].

## 2018-11-07 ENCOUNTER — Other Ambulatory Visit: Payer: Self-pay

## 2018-11-07 ENCOUNTER — Ambulatory Visit (INDEPENDENT_AMBULATORY_CARE_PROVIDER_SITE_OTHER): Payer: Medicare Other | Admitting: *Deleted

## 2018-11-07 ENCOUNTER — Telehealth (INDEPENDENT_AMBULATORY_CARE_PROVIDER_SITE_OTHER): Payer: Medicare Other | Admitting: Internal Medicine

## 2018-11-07 VITALS — BP 137/73 | HR 65

## 2018-11-07 DIAGNOSIS — I5032 Chronic diastolic (congestive) heart failure: Secondary | ICD-10-CM | POA: Diagnosis not present

## 2018-11-07 DIAGNOSIS — Z950 Presence of cardiac pacemaker: Secondary | ICD-10-CM | POA: Diagnosis not present

## 2018-11-07 DIAGNOSIS — I495 Sick sinus syndrome: Secondary | ICD-10-CM

## 2018-11-07 DIAGNOSIS — I480 Paroxysmal atrial fibrillation: Secondary | ICD-10-CM

## 2018-11-07 LAB — CUP PACEART REMOTE DEVICE CHECK
Battery Remaining Longevity: 90 mo
Battery Remaining Percentage: 100 %
Brady Statistic RA Percent Paced: 89 %
Brady Statistic RV Percent Paced: 4 %
Date Time Interrogation Session: 20200413043100
Implantable Lead Implant Date: 20081013
Implantable Lead Implant Date: 20081013
Implantable Lead Location: 753859
Implantable Lead Location: 753860
Implantable Lead Model: 4457
Implantable Lead Model: 4470
Implantable Lead Serial Number: 205344
Implantable Lead Serial Number: 210110
Implantable Pulse Generator Implant Date: 20181214
Lead Channel Impedance Value: 472 Ohm
Lead Channel Impedance Value: 526 Ohm
Lead Channel Pacing Threshold Amplitude: 0.5 V
Lead Channel Pacing Threshold Amplitude: 1.7 V
Lead Channel Pacing Threshold Pulse Width: 0.4 ms
Lead Channel Pacing Threshold Pulse Width: 0.4 ms
Lead Channel Setting Pacing Amplitude: 2 V
Lead Channel Setting Pacing Amplitude: 3 V
Lead Channel Setting Pacing Pulse Width: 0.4 ms
Lead Channel Setting Sensing Sensitivity: 2.5 mV
Pulse Gen Serial Number: 377798

## 2018-11-07 NOTE — Progress Notes (Signed)
[place]    Electrophysiology TeleHealth Note   Due to national recommendations of social distancing due to COVID 19, an audio/video telehealth visit is felt to be most appropriate for this patient at this time.  See MyChart message from today for the patient's consent to telehealth for [facility].   Date:  11/07/2018   ID:  Edward Woodward, DOB 15-Oct-1926, MRN [id number]  Location: patient's home  Provider location: [address], [place]  Evaluation Performed: Follow-up visit  PCP:  Lajean Manes, MD  Cardiologist:     Electrophysiologist:  SK   Chief Complaint: weaknes   History of Present Illness:    Edward Woodward is a 85 y.o. male who presents via audio/video conferencing for a telehealth visit today.  Since last being seen in our clinic, the patient reports   3 interval falls.  The last 2 occurred about a week ago, both occurring on returning from the bathroom.  Uses the commode sitting.  His wife says that he is increasingly confused; he himself does not recall so much the events of the fall.  He is working on leg strength with the exercise bicycle.  He feels stronger and more confident.  Blood pressure measurements around the time of these falls, 1 week ago, ranged from 782-423 systolic.  In the last few days he has been in the 130-40 range  Seen for pacemaker followup implanted remotely and elsewhere for sinus node dysfunction and presyncope. NO interval syncope  Device was at [facility] and underwent generator replacement 12/18.   He also has a history of coronary artery disease with mild left ventricular dysfunction with EF of 50% 2011.  He has no history of atrial fibrillation x as  documented time of his pneumothorax that was a consequence of a fall 12/14.  No chest pain or bleeding.  His falls were not complicated by head trauma  The patient denies symptoms of fevers, chills, cough, or new SOB worrisome for COVID 19.    Past Medical History:  Diagnosis  Date  . AMD (age-related macular degeneration), bilateral   . Anemia   . Anginal pain ([place])    pts wife states pt had to use nitroglycerin this am 04/09/2016  . CAD (coronary artery disease)     NYHA Class 1B, CABG 1985  . CHF (congestive heart failure) ([place])   . Collagen vascular disease ([place])   . Collapsed lung    right   . Complication of anesthesia    pts wife states pt gets confused   . Degeneration of lumbar or lumbosacral intervertebral disc   . Depressive disorder, not elsewhere classified   . Dysrhythmia   . GERD (gastroesophageal reflux disease)   . History of blood transfusion   . History of hiatal hernia   . HTN (hypertension)   . Hypercholesteremia   . Lower leg edema    bilat  . Multiple falls   . Myocardial infarction ([place])   . Neuromuscular disorder (HCC)    neuropathy  . Neuropathy   . Non Hodgkin's lymphoma ([place])   . OSA on CPAP   . PE (pulmonary embolism) 03/2015  . Presence of permanent cardiac pacemaker   . Prostate cancer ([place])   . Shortness of breath dyspnea    with exertion   . Sinoatrial node dysfunction (HCC)   . Ulcers of both lower legs (HCC)    history of   . Urinary incontinence     Past Surgical History:  Procedure Laterality Date  . APPENDECTOMY    . BACK SURGERY    . CARDIAC CATHETERIZATION N/A 08/27/2015   Procedure: Left Heart Cath and Cors/Grafts Angiography;  Surgeon: Belva Crome, MD;  Location: [facility];  Service: Cardiovascular;  Laterality: N/A;  . COLONOSCOPY N/A 08/26/2015   Procedure: COLONOSCOPY;  Surgeon: Clarene Essex, MD;  Location: [facility] ENDOSCOPY;  Service: Endoscopy;  Laterality: N/A;  . CORONARY ANGIOPLASTY WITH STENT PLACEMENT     s/p bare metal stent implant SVG-diagonal 11/23/05, s/p BM stent implantation SVG diagonal 10/22/06 (feeds LAD), and 05/2010 with DES to left circumflex  . [place]   SVG to Diagonal/LAD,  seqSVG to OM1 and OM2  . CYSTOSCOPY W/ URETERAL STENT  PLACEMENT Bilateral 04/10/2016   Procedure: CYSTOSCOPY WITH RETROGRADE PYELOGRAM/URETERAL STENT PLACEMENT;  Surgeon: Festus Aloe, MD;  Location: [facility];  Service: Urology;  Laterality: Bilateral;  . CYSTOSCOPY W/ URETERAL STENT PLACEMENT Bilateral 10/09/2016   Procedure: CYSTOSCOPY WITH BILATERAL RETROGRADE PYELOGRAM [name]  URETERAL STENT REMOVAL;  Surgeon: Festus Aloe, MD;  Location: [facility];  Service: Urology;  Laterality: Bilateral;  . ENDARTERECTOMY     bilat   . ESOPHAGOGASTRODUODENOSCOPY (EGD) WITH PROPOFOL N/A 08/26/2015   Procedure: ESOPHAGOGASTRODUODENOSCOPY (EGD) WITH PROPOFOL;  Surgeon: Clarene Essex, MD;  Location: [facility] ENDOSCOPY;  Service: Endoscopy;  Laterality: N/A;  . EXPLORATORY LAPAROTOMY    . LUMBAR FUSION    . PACEMAKER INSERTION    . PPM GENERATOR CHANGEOUT N/A 07/09/2017   Procedure: PPM GENERATOR CHANGEOUT;  Surgeon: Deboraha Sprang, MD;  Location: [facility] [facility];  Service: Cardiovascular;  Laterality: N/A;  . ROTATOR CUFF REPAIR     bilat  . TONSILLECTOMY      Current Outpatient Medications  Medication Sig Dispense Refill  . acetaminophen (TYLENOL) 500 MG tablet Take 1,000 mg by mouth every 6 (six) hours as needed for mild pain, moderate pain or headache.     . cetirizine (ZYRTEC) 10 MG tablet Take 10 mg by mouth daily.    . citalopram (CELEXA) 20 MG tablet Take 10 mg by mouth daily.     . clonazepam (KLONOPIN) 0.125 MG disintegrating tablet TAKE 1 TABLET IN THE MORNING AND 1/2 TABLET AT BEDTIME. 45 tablet 5  . hydrocortisone 2.5 % ointment Apply 1 application topically 2 (two) times daily as needed. Applied to affected area(s) of skin( pressure sores)    . Melatonin 3 MG TABS Take 3 mg by mouth at bedtime.    . metoprolol succinate (TOPROL XL) 25 MG 24 hr tablet Take 1 tablet (25 mg total) by mouth daily. 90 tablet 3  . nitroGLYCERIN (NITROLINGUAL) 0.4 MG/SPRAY spray Place 1 spray under the tongue every 5 (five) minutes x 3 doses as needed for chest pain.  12 g 3  . ondansetron (ZOFRAN) 4 MG tablet Take 1 tablet (4 mg total) by mouth every 8 (eight) hours as needed for nausea or vomiting. 20 tablet 0  . Polyethyl Glycol-Propyl Glycol (SYSTANE) 0.4-0.3 % SOLN Place 1 drop into both eyes at bedtime.     . polyethylene glycol (MIRALAX / GLYCOLAX) packet Take 17 g by mouth daily as needed for moderate constipation.     . predniSONE (DELTASONE) 5 MG tablet Take 1 tablet (5 mg total) by mouth daily. 90 tablet 0  . torsemide (DEMADEX) 20 MG tablet Take 20 mg by mouth as needed (for swelling (edema)).    . vitamin B-12 (  CYANOCOBALAMIN) 1000 MCG tablet Take 1,000 mcg by mouth at bedtime.     Marland Kitchen warfarin (COUMADIN) 5 MG tablet Take 1 tablet (5 mg total) by mouth daily at 6 PM. Take 1 tablet by mouth daily at 6 pm on M,W,F. Take 1/2 tab (2.5mg ) all other days 30 tablet 2  . ZYTIGA 250 MG tablet TAKE 4 TABLETS BY MOUTH ONCE DAILY AS DIRECTED.  TAKE 1 HOUR BEFORE OR 2 HOURS AFTER A MEAL 120 tablet 11   No current facility-administered medications for this visit.     Allergies:   Penicillins and Simvastatin   Social History:  The patient  reports that he quit smoking about 63 years ago. His smoking use included cigarettes. He has a 2.50 pack-year smoking history. He has never used smokeless tobacco. He reports that he does not drink alcohol or use drugs.   Family History:  The patient's   family history includes Cancer in his mother; Heart disease in his brother; Heart disease (age of onset: 64) in his father.   ROS:  Please see the history of present illness.   All other systems are personally reviewed and negative.    Exam:    Vital Signs:  BP 137/73   Pulse 65     Well appearing, alert and conversant, regular work of breathing,  good skin color Eyes- anicteric, neuro- grossly intact, skin- no apparent rash or lesions or cyanosis, mouth- oral mucosa is pink   Labs/Other Tests and Data Reviewed:    Recent Labs: Personally reviewed  INR 2.6    10/12/2018: ALT 9; BUN 39; Creatinine 1.50; Hemoglobin 11.2; Platelet Count 119; Potassium 5.0; Sodium 140   Wt Readings from Last 3 Encounters:  07/25/18 198 lb 8 oz (90 kg)  07/07/18 194 lb 6.4 oz (88.2 kg)  04/07/18 186 lb 6.4 oz (84.6 kg)     Other studies personally reviewed:    Last device remote is reviewed from [facility] PDF dated 1/20 which reveals normal device function, no arrhythmias     ASSESSMENT & PLAN:     Sinus node dysfunction  Atrial pacer dependence   Atrial fibrillation   Coronary artery disease  Hypertension   Orthostatic Hypotension  Hx of PE and DVT on coumadin   Pacemaker-[employer]  Lymphoma non-curative/palliative intent per oncology note  No interval oncology notes.  Continues with hypertension and orthostatic lightheadedness and falls.  I reviewed the importance of working on leg strength.  Discussed sitting on the commode for a few more moments prior to standing so to equilibrate vasomotor tone  Device function normal  Interval cognition issues; encouraged him to reach out to Dr. Felipa Eth     COVID 19 screen The patient denies symptoms of COVID 19 at this time.  The importance of social distancing was discussed today.  Follow-up:  95 m Next remote: As Scheduled   Current medicines are reviewed at length with the patient today.   The patient does not have concerns regarding his medicines.  The following changes were made today:  none  Labs/ tests ordered today include:   No orders of the defined types were placed in this encounter.     Patient Risk:  after full review of this patients clinical status, I feel that they are at moderate risk at this time.  Today, I have spent26 * minutes with the patient with telehealth technology discussing the above.  Signed, Virl Axe, MD  11/07/2018 3:05 PM     [facility]  [address] [place] [address] [phone] (office)  [phone] (fax)

## 2018-11-08 ENCOUNTER — Encounter: Payer: Medicare Other | Admitting: Internal Medicine

## 2018-11-15 DIAGNOSIS — Z0000 Encounter for general adult medical examination without abnormal findings: Secondary | ICD-10-CM | POA: Diagnosis not present

## 2018-11-15 DIAGNOSIS — N183 Chronic kidney disease, stage 3 (moderate): Secondary | ICD-10-CM | POA: Diagnosis not present

## 2018-11-15 DIAGNOSIS — G609 Hereditary and idiopathic neuropathy, unspecified: Secondary | ICD-10-CM | POA: Diagnosis not present

## 2018-11-15 DIAGNOSIS — E7800 Pure hypercholesterolemia, unspecified: Secondary | ICD-10-CM | POA: Diagnosis not present

## 2018-11-15 DIAGNOSIS — I509 Heart failure, unspecified: Secondary | ICD-10-CM | POA: Diagnosis not present

## 2018-11-15 DIAGNOSIS — G4733 Obstructive sleep apnea (adult) (pediatric): Secondary | ICD-10-CM | POA: Diagnosis not present

## 2018-11-15 DIAGNOSIS — I129 Hypertensive chronic kidney disease with stage 1 through stage 4 chronic kidney disease, or unspecified chronic kidney disease: Secondary | ICD-10-CM | POA: Diagnosis not present

## 2018-11-15 DIAGNOSIS — F321 Major depressive disorder, single episode, moderate: Secondary | ICD-10-CM | POA: Diagnosis not present

## 2018-11-15 DIAGNOSIS — C8590 Non-Hodgkin lymphoma, unspecified, unspecified site: Secondary | ICD-10-CM | POA: Diagnosis not present

## 2018-11-15 DIAGNOSIS — I700 Atherosclerosis of aorta: Secondary | ICD-10-CM | POA: Diagnosis not present

## 2018-11-15 DIAGNOSIS — F0280 Dementia in other diseases classified elsewhere without behavioral disturbance: Secondary | ICD-10-CM | POA: Diagnosis not present

## 2018-11-15 DIAGNOSIS — D696 Thrombocytopenia, unspecified: Secondary | ICD-10-CM | POA: Diagnosis not present

## 2018-11-15 NOTE — Progress Notes (Signed)
Remote pacemaker transmission.   

## 2018-11-20 ENCOUNTER — Encounter: Payer: Self-pay | Admitting: Oncology

## 2018-11-21 ENCOUNTER — Other Ambulatory Visit: Payer: Self-pay | Admitting: *Deleted

## 2018-11-21 ENCOUNTER — Telehealth: Payer: Self-pay | Admitting: Oncology

## 2018-11-21 DIAGNOSIS — Z7901 Long term (current) use of anticoagulants: Secondary | ICD-10-CM

## 2018-11-21 NOTE — Telephone Encounter (Signed)
lvm to inform pt wife of lab only appt 4/30 at 0915 per Dr Benay Spice orders and 4/27 staff message

## 2018-11-24 ENCOUNTER — Other Ambulatory Visit: Payer: Self-pay

## 2018-11-24 ENCOUNTER — Other Ambulatory Visit: Payer: Medicare Other

## 2018-11-24 ENCOUNTER — Inpatient Hospital Stay: Payer: Medicare Other

## 2018-11-24 ENCOUNTER — Ambulatory Visit: Payer: Medicare Other | Admitting: Oncology

## 2018-11-24 DIAGNOSIS — Z7901 Long term (current) use of anticoagulants: Secondary | ICD-10-CM

## 2018-11-24 LAB — PROTIME-INR
INR: 2.6 — ABNORMAL HIGH (ref 0.8–1.2)
Prothrombin Time: 27.4 seconds — ABNORMAL HIGH (ref 11.4–15.2)

## 2018-12-13 ENCOUNTER — Other Ambulatory Visit: Payer: Self-pay | Admitting: Oncology

## 2018-12-13 DIAGNOSIS — C61 Malignant neoplasm of prostate: Secondary | ICD-10-CM

## 2018-12-23 ENCOUNTER — Encounter: Payer: Self-pay | Admitting: Oncology

## 2018-12-26 NOTE — Telephone Encounter (Signed)
Spoke with Edward Woodward's spouse, Edward Woodward. "We'd like Dr. Benay Spice to check more extensive labs to determine why [name] is more confused, having delusions and sundowning.   His PCP increased Citalopram to a whole pill daily.   Every evening repetitively he thinks we have aother house and really believes a guy is chloroforming [place] and transferring [place] beliving he is waking up in the other home.  Wants to report to the police.  We do not have two homes.  He knows me at day time but but thinks I'm a caregiver at night.   He is not eating as well.  Drinks one protein shake per day.  Trying to find a balance with meals he has explosive diarrhea at times but not continuous diarrhea.   He's older, weaker but we'll leave it up to Dr. Benay Spice what he'd like to do for Thursday's appointment."

## 2018-12-27 ENCOUNTER — Telehealth: Payer: Self-pay | Admitting: *Deleted

## 2018-12-27 ENCOUNTER — Telehealth: Payer: Self-pay | Admitting: Oncology

## 2018-12-27 DIAGNOSIS — C61 Malignant neoplasm of prostate: Secondary | ICD-10-CM

## 2018-12-27 DIAGNOSIS — Z7901 Long term (current) use of anticoagulants: Secondary | ICD-10-CM

## 2018-12-27 NOTE — Telephone Encounter (Signed)
Called and LMVM  To call me back to confirm att date/time per 6/2 staff message

## 2018-12-27 NOTE — Telephone Encounter (Signed)
Left VM for wife that labs can be done at [facility] office as usual--scheduling message sent. MD has offered a telephone visit or WebEx on 6/4 or to reschedule for 1 month. Requested she return call to confirm her preference.

## 2018-12-28 ENCOUNTER — Inpatient Hospital Stay: Payer: Medicare Other | Attending: Oncology

## 2018-12-28 ENCOUNTER — Other Ambulatory Visit: Payer: Medicare Other

## 2018-12-28 DIAGNOSIS — Z7901 Long term (current) use of anticoagulants: Secondary | ICD-10-CM | POA: Insufficient documentation

## 2018-12-28 DIAGNOSIS — Z86718 Personal history of other venous thrombosis and embolism: Secondary | ICD-10-CM | POA: Insufficient documentation

## 2018-12-28 NOTE — Telephone Encounter (Signed)
Left VM for wife that RN will call her with lab results that are available on 12/29/18 and at that time will work on his follow up with Dr. Benay Spice. She had left VM yesterday asking for MD visit after all lab results are in.

## 2018-12-29 ENCOUNTER — Inpatient Hospital Stay: Payer: Medicare Other

## 2018-12-29 ENCOUNTER — Other Ambulatory Visit: Payer: Medicare Other

## 2018-12-29 ENCOUNTER — Other Ambulatory Visit: Payer: Self-pay

## 2018-12-29 ENCOUNTER — Telehealth: Payer: Self-pay | Admitting: *Deleted

## 2018-12-29 ENCOUNTER — Ambulatory Visit: Payer: Medicare Other | Admitting: Oncology

## 2018-12-29 DIAGNOSIS — C61 Malignant neoplasm of prostate: Secondary | ICD-10-CM

## 2018-12-29 DIAGNOSIS — Z86718 Personal history of other venous thrombosis and embolism: Secondary | ICD-10-CM | POA: Diagnosis not present

## 2018-12-29 DIAGNOSIS — Z7901 Long term (current) use of anticoagulants: Secondary | ICD-10-CM | POA: Diagnosis not present

## 2018-12-29 LAB — CMP (CANCER CENTER ONLY)
ALT: 9 U/L (ref 0–44)
AST: 12 U/L — ABNORMAL LOW (ref 15–41)
Albumin: 4 g/dL (ref 3.5–5.0)
Alkaline Phosphatase: 53 U/L (ref 38–126)
Anion gap: 7 (ref 5–15)
BUN: 24 mg/dL — ABNORMAL HIGH (ref 8–23)
CO2: 28 mmol/L (ref 22–32)
Calcium: 9.8 mg/dL (ref 8.9–10.3)
Chloride: 103 mmol/L (ref 98–111)
Creatinine: 1.43 mg/dL — ABNORMAL HIGH (ref 0.61–1.24)
GFR, Est AFR Am: 50 mL/min — ABNORMAL LOW (ref >60)
GFR, Estimated: 43 mL/min — ABNORMAL LOW (ref >60)
Glucose, Bld: 102 mg/dL — ABNORMAL HIGH (ref 70–99)
Potassium: 4.5 mmol/L (ref 3.5–5.1)
Sodium: 138 mmol/L (ref 135–145)
Total Bilirubin: 0.4 mg/dL (ref 0.3–1.2)
Total Protein: 6.2 g/dL — ABNORMAL LOW (ref 6.5–8.1)

## 2018-12-29 LAB — PROTIME-INR
INR: 1.9 — ABNORMAL HIGH (ref 0.8–1.2)
Prothrombin Time: 21.9 seconds — ABNORMAL HIGH (ref 11.4–15.2)

## 2018-12-29 LAB — CBC WITH DIFFERENTIAL (CANCER CENTER ONLY)
Abs Immature Granulocytes: 0.06 10*3/uL (ref 0.00–0.07)
Basophils Absolute: 0 10*3/uL (ref 0.0–0.1)
Basophils Relative: 1 %
Eosinophils Absolute: 0.4 10*3/uL (ref 0.0–0.5)
Eosinophils Relative: 6 %
HCT: 35.3 % — ABNORMAL LOW (ref 39.0–52.0)
Hemoglobin: 11.5 g/dL — ABNORMAL LOW (ref 13.0–17.0)
Immature Granulocytes: 1 %
Lymphocytes Relative: 23 %
Lymphs Abs: 1.6 10*3/uL (ref 0.7–4.0)
MCH: 31.2 pg (ref 26.0–34.0)
MCHC: 32.6 g/dL (ref 30.0–36.0)
MCV: 95.7 fL (ref 80.0–100.0)
Monocytes Absolute: 0.7 10*3/uL (ref 0.1–1.0)
Monocytes Relative: 10 %
Neutro Abs: 4.3 10*3/uL (ref 1.7–7.7)
Neutrophils Relative %: 59 %
Platelet Count: 142 10*3/uL — ABNORMAL LOW (ref 150–400)
RBC: 3.69 MIL/uL — ABNORMAL LOW (ref 4.22–5.81)
RDW: 14.6 % (ref 11.5–15.5)
WBC Count: 7.1 10*3/uL (ref 4.0–10.5)
nRBC: 0 % (ref 0.0–0.2)

## 2018-12-29 NOTE — Telephone Encounter (Signed)
Left VM for wife w/INR, CBC, CMP results. Per Dr. Maretta Los to direct as to why he is more confused now. PSA has not returned yet. Requested she return call to let us know when she wants to have patient see MD or WebEX or telephone visit. Will recheck labs in 1 month.

## 2018-12-30 ENCOUNTER — Telehealth: Payer: Self-pay

## 2018-12-30 ENCOUNTER — Encounter: Payer: Self-pay | Admitting: *Deleted

## 2018-12-30 LAB — PSA, TOTAL AND FREE
PSA, Free Pct: 10.7 %
PSA, Free: 2.03 ng/mL
Prostate Specific Ag, Serum: 19 ng/mL — ABNORMAL HIGH (ref 0.0–4.0)

## 2018-12-30 NOTE — Progress Notes (Signed)
Prior PSA-free and total was cancelled yesterday and PSA serum was ordered. [facility] notified by [facility] lab of the change. Both tests were run, but [facility] said he will not be charged for the free/total PSA.

## 2018-12-30 NOTE — Telephone Encounter (Signed)
Spoke with wife. Advised per Dr. Benay Spice PSA is higher and if higher next lab visit the Zytiga will be discontinued. Wife verbalized understanding

## 2018-12-30 NOTE — Telephone Encounter (Signed)
-----   Message from Ladell Pier, MD sent at 12/30/2018  7:44 AM EDT ----- Please call patient, the PSA is higher, is higher at the next lab visit we will discontinue the [facility]

## 2018-12-30 NOTE — Telephone Encounter (Signed)
Left VM for wife to call next week and let us know when she wants patient to be seen or if she prefers a telephone/WebEx visit since visitors are not allowed in office still.

## 2019-01-03 ENCOUNTER — Other Ambulatory Visit: Payer: Self-pay | Admitting: *Deleted

## 2019-01-03 ENCOUNTER — Encounter: Payer: Self-pay | Admitting: Oncology

## 2019-01-03 DIAGNOSIS — Z7901 Long term (current) use of anticoagulants: Secondary | ICD-10-CM

## 2019-01-03 DIAGNOSIS — C61 Malignant neoplasm of prostate: Secondary | ICD-10-CM

## 2019-01-03 NOTE — Progress Notes (Signed)
Per Dr. Benay Spice: Recheck INR and PSA in 4-6 weeks. Scheduling message sent.

## 2019-01-27 ENCOUNTER — Other Ambulatory Visit: Payer: Self-pay | Admitting: Nurse Practitioner

## 2019-01-27 DIAGNOSIS — Z7901 Long term (current) use of anticoagulants: Secondary | ICD-10-CM

## 2019-01-30 ENCOUNTER — Other Ambulatory Visit: Payer: Self-pay

## 2019-01-30 ENCOUNTER — Encounter: Payer: Self-pay | Admitting: Neurology

## 2019-01-30 ENCOUNTER — Ambulatory Visit (INDEPENDENT_AMBULATORY_CARE_PROVIDER_SITE_OTHER): Payer: Medicare Other | Admitting: Neurology

## 2019-01-30 VITALS — BP 152/66 | HR 66 | Ht 70.0 in | Wt 193.0 lb

## 2019-01-30 DIAGNOSIS — G3183 Dementia with Lewy bodies: Secondary | ICD-10-CM

## 2019-01-30 DIAGNOSIS — G20 Parkinson's disease: Secondary | ICD-10-CM

## 2019-01-30 DIAGNOSIS — G253 Myoclonus: Secondary | ICD-10-CM

## 2019-01-30 DIAGNOSIS — G629 Polyneuropathy, unspecified: Secondary | ICD-10-CM | POA: Diagnosis not present

## 2019-01-30 DIAGNOSIS — F0281 Dementia in other diseases classified elsewhere with behavioral disturbance: Secondary | ICD-10-CM

## 2019-01-30 MED ORDER — CARBIDOPA-LEVODOPA 25-100 MG PO TABS: 1.0000 | ORAL_TABLET | Freq: Three times a day (TID) | ORAL | 2 refills | Status: DC

## 2019-01-30 MED ORDER — CLONAZEPAM 0.5 MG PO TABS: 0.2500 mg | ORAL_TABLET | Freq: Two times a day (BID) | ORAL | 5 refills | Status: DC

## 2019-01-30 NOTE — Progress Notes (Signed)
Follow-up Visit   Date: 01/30/19    DSHAUN Woodward MRN: [id number] DOB: 23-Nov-1926   Interim History: Edward Woodward is a 85 y.o. right-handed Caucasian male with prostate cancer, non-Hodgkin's lymphoma on chemotherapy, depression, CAD status post CABG, atrial fibrillation on Coumadin, sinus node dysfunction s/p PPM, history of DVT and PE, and lumbar surgery for stenosis returning to the clinic for follow-up of dementia, neuropathy, and myoclonic jerks.  The patient was accompanied to the clinic by wife who also provides collateral information.    History of present illness: Since early 2016, patient has started to require greater assistance for ADLs. He was previously walking with a rollator and able to bath himself.  In mid-June 2017, patient condition progressed where he was unable to walk, began falling more, hallucinating, limbs jerking, and felt increasingly weak.  He was admitted to [facility] on 6/18 -6/23 for confusion, arm shaking, and poor PO intake in the setting of intraabdominal mass.He was treated conservatively, repleted with electrotyles, and given IV hydration.  He eventually showed improvement and was discharged back to SNF. He continues to have jerking movements of the hands, but overall significantly improved from before.  He noticed marked improvement of his myoclonic jerks since being on clonazepam 0.25mg  bid and now only has intermittent hand tremors.   He has previously been evaluated at [facility] in 2006 at which time he was diagnosed by EMG with multiple chronic severe radiculopathies with evidence of ongoing denervation in the bilateral gastrocnemius muscles and superimposed mild axonal polyneuropathy. More recently, he was evaluated in 2013 for falls and imbalance and diagnosed with idiopathic neuropathy and due to clinical symptoms suggestive of Parkinsons disease, also offered a trail of sinemet 25/100mg  TID.    UPDATE 01/30/2019:   He is here for 6 month follow-up visit.  Wife reports that he is having increase paranoia/hallucination and often misnames his wife.  He feels that there is an intruder in his home and gets frustrated by this.  Wife also feels that he has become more stiffer, slower, and more forgetful.  He is able to perform most of his basic ADLs, however requires some assistance with dressing. His myoclonic jerks are well controlled on clonazepam.  Wife is requesting not to have oral disintegrating tablets as they lose the medication if they are handling with damp hands.  Medications:  Current Outpatient Medications on File Prior to Visit  Medication Sig Dispense Refill   acetaminophen (TYLENOL) 500 MG tablet Take 1,000 mg by mouth every 6 (six) hours as needed for mild pain, moderate pain or headache.      cetirizine (ZYRTEC) 10 MG tablet Take 10 mg by mouth daily.     citalopram (CELEXA) 20 MG tablet Take 10 mg by mouth daily.      hydrocortisone 2.5 % ointment Apply 1 application topically 2 (two) times daily as needed. Applied to affected area(s) of skin( pressure sores)     Melatonin 3 MG TABS Take 3 mg by mouth at bedtime.     metoprolol succinate (TOPROL XL) 25 MG 24 hr tablet Take 1 tablet (25 mg total) by mouth daily. 90 tablet 3   nitroGLYCERIN (NITROLINGUAL) 0.4 MG/SPRAY spray Place 1 spray under the tongue every 5 (five) minutes x 3 doses as needed for chest pain. 12 g 3   ondansetron (ZOFRAN) 4 MG tablet Take 1 tablet (4 mg total) by mouth every 8 (eight) hours as needed for nausea or vomiting. [place]  tablet 0   Polyethyl Glycol-Propyl Glycol (SYSTANE) 0.4-0.3 % SOLN Place 1 drop into both eyes at bedtime.      polyethylene glycol (MIRALAX / GLYCOLAX) packet Take 17 g by mouth daily as needed for moderate constipation.      predniSONE (DELTASONE) 5 MG tablet TAKE 1 TABLET BY MOUTH EVERY DAY 90 tablet 0   torsemide (DEMADEX) 20 MG tablet Take 20 mg by mouth as needed (for swelling (edema)).       vitamin B-12 (CYANOCOBALAMIN) 1000 MCG tablet Take 1,000 mcg by mouth at bedtime.      ZYTIGA 250 MG tablet TAKE 4 TABLETS BY MOUTH ONCE DAILY AS DIRECTED.  TAKE 1 HOUR BEFORE OR 2 HOURS AFTER A MEAL 120 tablet 11   warfarin (COUMADIN) 5 MG tablet TAKE 1 TABLET DAILY AT 6 PM. TAKE 1 TABLET DAILY AT 6 PM ON M,W,F TAKE 1/2 TAB ALL OTHER DAYS 90 tablet 0   No current facility-administered medications on file prior to visit.     Allergies:  Allergies  Allergen Reactions   Penicillins Hives, Shortness Of Breath and Other (See Comments)    Has patient had a PCN reaction causing immediate rash, facial/tongue/throat swelling, SOB or lightheadedness with hypotension: Yes Has patient had a PCN reaction causing severe rash involving mucus membranes or skin necrosis: No Has patient had a PCN reaction that required hospitalization No Has patient had a PCN reaction occurring within the last 10 years: No If all of the above answers are "NO", then may proceed with Cephalosporin use.    Simvastatin Other (See Comments)    Reaction:  Muscle weakness    Review of Systems:  CONSTITUTIONAL: No fevers, chills, night sweats, or weight loss.  EYES: No visual changes or eye pain ENT: No hearing changes.  No history of nose bleeds.   RESPIRATORY: No cough, wheezing and shortness of breath.   CARDIOVASCULAR: Negative for chest pain, and palpitations.   GI: Negative for abdominal discomfort, blood in stools or black stools.  No recent change in bowel habits.   GU:  No history of incontinence.   MUSCLOSKELETAL: No history of joint pain or swelling.  No myalgias.   SKIN: Negative for lesions, rash, and itching.   ENDOCRINE: Negative for cold or heat intolerance, polydipsia or goiter.   PSYCH:  No depression or anxiety symptoms.   NEURO: As Above.   Vital Signs:  BP (!) 152/66    Pulse 66    Ht 5\' 10"  (1.778 m)    Wt 193 lb (87.5 kg)    SpO2 97%    BMI 27.69 kg/m   General Medical Exam:   General:   Frail appearing, comfortable, sitting in wheelchair   Neurological Exam: MENTAL STATUS including orientation to time, place, person, recent and remote memory, attention span and concentration, language, and fund of knowledge is poor.  Affect is blunted, but he smiles appropriately.  He is very slow to follow simple commands.   CRANIAL NERVES:  Pupils are round and reactive. Extraocular muscles intact, mild upgaze restriction.  Face is symmetric.   MOTOR:  Motor strength is 5/5 in all extremities, including hip flexors.  Generalized rigidity of the arms and legs.  No tremor or myoclonic jerks  COORDINATION/GAIT: Generalized bradykinesia throughout.  Gait is not tested as patient is in wheelchair.    Data: CT head 01/12/2016: No acute intracranial hemorrhage. Age-related atrophy and chronic microvascular ischemic disease.   IMPRESSION/PLAN: 1.  Dementia with behavior disturbance, probable  Lewy body dementia with his increased hallucinations and parkinsonism.  - patient was told to start a medication by PCP, but does not recall the name  - I will contact PCP's office for the name of this medication  - Consider aricept and/or antipsychotic medication going forward  2.  Parkinsonism manifesting with bradykinesia and rigidity, which is apparent today.  Exam also remained severely blunted.   - Start sinemet half tablet TID and titrate to 1 tab TID (schedule provided)  3.  Multifocal myoclonic jerks, stable  -Adjust clonazepam to 0.25 mg tab twice daily, this may help with behavior  Return to clinic in 6 months  Greater than 50% of this 30 minute visit was spent in counseling, explanation of diagnosis, planning of further management, and coordination of care.  Thank you for allowing me to participate in patient's care.  If I can answer any additional questions, I would be pleased to do so.    Sincerely,    Corinthian Kemler K. Posey Pronto, DO

## 2019-01-30 NOTE — Patient Instructions (Addendum)
Start Carbidopa Levodopa as follows at least 30-min prior to meals:     AM  Afternoon   Evening   Week 1:  1/2 tab  1/2 tab   1/2 tab  Week 2:   1/2 tab  1/2 tab   1 tab  Week 3:  1/2 tab  1 tab   1 tab  Week 4:  1 tab  1 tab   1 tab  *Avoid taking with protein products, such as milk, meat, cheese  *if you develop nausea, take with crackers  I will follow-up with Dr. Carlyle Lipa office regarding the medication that was discussed  Return to clinic in 6 months

## 2019-01-31 ENCOUNTER — Telehealth: Payer: Self-pay

## 2019-01-31 MED ORDER — DONEPEZIL HCL 10 MG PO TABS: ORAL_TABLET | ORAL | 5 refills | Status: DC

## 2019-01-31 NOTE — Telephone Encounter (Signed)
Noted.  Ok to stay on citalopram 20mg  daily.  I will add Aricept 5mg  qhs x 2 weeks, then 10mg  qhs to see if this helps.  Patient's wife has been notified.    Alvina Strother K. Posey Pronto, DO

## 2019-01-31 NOTE — Telephone Encounter (Signed)
Dr Felipa Eth had increased patients citalopram to 20mg  daily.  He did not have any other medication suggestions.  This is reflected in patients medication list.

## 2019-02-06 ENCOUNTER — Ambulatory Visit (INDEPENDENT_AMBULATORY_CARE_PROVIDER_SITE_OTHER): Payer: Medicare Other | Admitting: *Deleted

## 2019-02-06 DIAGNOSIS — I480 Paroxysmal atrial fibrillation: Secondary | ICD-10-CM | POA: Diagnosis not present

## 2019-02-06 DIAGNOSIS — I495 Sick sinus syndrome: Secondary | ICD-10-CM

## 2019-02-07 ENCOUNTER — Telehealth: Payer: Self-pay

## 2019-02-07 LAB — CUP PACEART REMOTE DEVICE CHECK
Battery Remaining Longevity: 90 mo
Battery Remaining Percentage: 100 %
Brady Statistic RA Percent Paced: 90 %
Brady Statistic RV Percent Paced: 3 %
Date Time Interrogation Session: 20200713043100
Implantable Lead Implant Date: 20081013
Implantable Lead Implant Date: 20081013
Implantable Lead Location: 753859
Implantable Lead Location: 753860
Implantable Lead Model: 4457
Implantable Lead Model: 4470
Implantable Lead Serial Number: 205344
Implantable Lead Serial Number: 210110
Implantable Pulse Generator Implant Date: 20181214
Lead Channel Impedance Value: 443 Ohm
Lead Channel Impedance Value: 546 Ohm
Lead Channel Pacing Threshold Amplitude: 0.4 V
Lead Channel Pacing Threshold Amplitude: 1.6 V
Lead Channel Pacing Threshold Pulse Width: 0.4 ms
Lead Channel Pacing Threshold Pulse Width: 0.4 ms
Lead Channel Setting Pacing Amplitude: 2 V
Lead Channel Setting Pacing Amplitude: 3 V
Lead Channel Setting Pacing Pulse Width: 0.4 ms
Lead Channel Setting Sensing Sensitivity: 2.5 mV
Pulse Gen Serial Number: 377798

## 2019-02-07 NOTE — Telephone Encounter (Signed)
Left message for patient to remind of missed remote transmission.  

## 2019-02-14 ENCOUNTER — Inpatient Hospital Stay: Payer: Medicare Other | Attending: Oncology

## 2019-02-14 ENCOUNTER — Other Ambulatory Visit: Payer: Self-pay

## 2019-02-14 DIAGNOSIS — Z86718 Personal history of other venous thrombosis and embolism: Secondary | ICD-10-CM | POA: Diagnosis not present

## 2019-02-14 DIAGNOSIS — Z7901 Long term (current) use of anticoagulants: Secondary | ICD-10-CM

## 2019-02-14 DIAGNOSIS — C61 Malignant neoplasm of prostate: Secondary | ICD-10-CM

## 2019-02-14 LAB — PROTIME-INR
INR: 2.3 — ABNORMAL HIGH (ref 0.8–1.2)
Prothrombin Time: 25.3 seconds — ABNORMAL HIGH (ref 11.4–15.2)

## 2019-02-15 ENCOUNTER — Telehealth: Payer: Self-pay | Admitting: Oncology

## 2019-02-15 LAB — PROSTATE-SPECIFIC AG, SERUM (LABCORP): Prostate Specific Ag, Serum: 25.2 ng/mL — ABNORMAL HIGH (ref 0.0–4.0)

## 2019-02-15 NOTE — Telephone Encounter (Signed)
Called and spoke with patients wife regarding Lab appointment added @ [facility] for a week prior to appointment with Dr Benay Spice.  She was ok with date/time

## 2019-02-15 NOTE — Progress Notes (Signed)
Remote pacemaker transmission.   

## 2019-02-20 ENCOUNTER — Other Ambulatory Visit: Payer: Self-pay | Admitting: Cardiology

## 2019-03-03 ENCOUNTER — Other Ambulatory Visit: Payer: Medicare Other

## 2019-03-07 ENCOUNTER — Telehealth: Payer: Self-pay | Admitting: *Deleted

## 2019-03-07 ENCOUNTER — Telehealth: Payer: Self-pay | Admitting: Oncology

## 2019-03-07 ENCOUNTER — Other Ambulatory Visit: Payer: Self-pay | Admitting: *Deleted

## 2019-03-07 DIAGNOSIS — C61 Malignant neoplasm of prostate: Secondary | ICD-10-CM

## 2019-03-07 DIAGNOSIS — Z7901 Long term (current) use of anticoagulants: Secondary | ICD-10-CM

## 2019-03-07 NOTE — Telephone Encounter (Signed)
At [name] request left VM asking if patient/wife is willing to move his WebEx visit from 8/19 to /12 at 12:30 pm. Requested return call with decision.

## 2019-03-07 NOTE — Telephone Encounter (Signed)
Called patient regarding rescheduling 8/19 appointment, patient does not have voicemail set up and this appointment will stay on 08/19.

## 2019-03-08 ENCOUNTER — Telehealth: Payer: Self-pay

## 2019-03-08 ENCOUNTER — Encounter: Payer: Self-pay | Admitting: Oncology

## 2019-03-08 ENCOUNTER — Inpatient Hospital Stay: Payer: Medicare Other | Attending: Oncology

## 2019-03-08 ENCOUNTER — Other Ambulatory Visit: Payer: Self-pay

## 2019-03-08 DIAGNOSIS — Z7901 Long term (current) use of anticoagulants: Secondary | ICD-10-CM

## 2019-03-08 DIAGNOSIS — Z86718 Personal history of other venous thrombosis and embolism: Secondary | ICD-10-CM | POA: Diagnosis not present

## 2019-03-08 DIAGNOSIS — Z86711 Personal history of pulmonary embolism: Secondary | ICD-10-CM | POA: Diagnosis not present

## 2019-03-08 DIAGNOSIS — C61 Malignant neoplasm of prostate: Secondary | ICD-10-CM | POA: Insufficient documentation

## 2019-03-08 LAB — BASIC METABOLIC PANEL - CANCER CENTER ONLY
Anion gap: 9 (ref 5–15)
BUN: 22 mg/dL (ref 8–23)
CO2: 26 mmol/L (ref 22–32)
Calcium: 9.2 mg/dL (ref 8.9–10.3)
Chloride: 107 mmol/L (ref 98–111)
Creatinine: 1.37 mg/dL — ABNORMAL HIGH (ref 0.61–1.24)
GFR, Est AFR Am: 53 mL/min — ABNORMAL LOW (ref >60)
GFR, Estimated: 45 mL/min — ABNORMAL LOW (ref >60)
Glucose, Bld: 110 mg/dL — ABNORMAL HIGH (ref 70–99)
Potassium: 4.6 mmol/L (ref 3.5–5.1)
Sodium: 142 mmol/L (ref 135–145)

## 2019-03-08 LAB — CBC WITH DIFFERENTIAL (CANCER CENTER ONLY)
Abs Immature Granulocytes: 0.05 10*3/uL (ref 0.00–0.07)
Basophils Absolute: 0 10*3/uL (ref 0.0–0.1)
Basophils Relative: 0 %
Eosinophils Absolute: 0.3 10*3/uL (ref 0.0–0.5)
Eosinophils Relative: 4 %
HCT: 33.5 % — ABNORMAL LOW (ref 39.0–52.0)
Hemoglobin: 10.6 g/dL — ABNORMAL LOW (ref 13.0–17.0)
Immature Granulocytes: 1 %
Lymphocytes Relative: 11 %
Lymphs Abs: 1 10*3/uL (ref 0.7–4.0)
MCH: 30.4 pg (ref 26.0–34.0)
MCHC: 31.6 g/dL (ref 30.0–36.0)
MCV: 96 fL (ref 80.0–100.0)
Monocytes Absolute: 0.7 10*3/uL (ref 0.1–1.0)
Monocytes Relative: 8 %
Neutro Abs: 7.3 10*3/uL (ref 1.7–7.7)
Neutrophils Relative %: 76 %
Platelet Count: 177 10*3/uL (ref 150–400)
RBC: 3.49 MIL/uL — ABNORMAL LOW (ref 4.22–5.81)
RDW: 13.4 % (ref 11.5–15.5)
WBC Count: 9.5 10*3/uL (ref 4.0–10.5)
nRBC: 0 % (ref 0.0–0.2)

## 2019-03-08 LAB — PROTIME-INR
INR: 2.3 — ABNORMAL HIGH (ref 0.8–1.2)
Prothrombin Time: 24.8 seconds — ABNORMAL HIGH (ref 11.4–15.2)

## 2019-03-08 NOTE — Telephone Encounter (Signed)
-----   Message from Ladell Pier, MD sent at 03/08/2019  2:04 PM EDT ----- Please call patient, same Coumadin, follow-up as scheduled

## 2019-03-08 NOTE — Telephone Encounter (Signed)
Left vm msg in regards to note below:  Ladell Pier, MD  P [facility] Mo Pod 2        Please call patient, same Coumadin, follow-up as scheduled

## 2019-03-09 ENCOUNTER — Encounter: Payer: Self-pay | Admitting: *Deleted

## 2019-03-09 ENCOUNTER — Other Ambulatory Visit: Payer: Self-pay | Admitting: Oncology

## 2019-03-09 ENCOUNTER — Telehealth: Payer: Self-pay | Admitting: *Deleted

## 2019-03-09 DIAGNOSIS — C61 Malignant neoplasm of prostate: Secondary | ICD-10-CM

## 2019-03-09 NOTE — Telephone Encounter (Signed)
Left VM that labs are stable. Continue same coumadin (see flowsheet). WebEx scheduled for 8/26

## 2019-03-09 NOTE — Progress Notes (Unsigned)
Message sent to scheduling for WebEx with Dr. Benay Spice on 8/26 at 0830 or 8/28 at 0800 or 0830.

## 2019-03-10 ENCOUNTER — Telehealth: Payer: Self-pay | Admitting: Interventional Cardiology

## 2019-03-10 ENCOUNTER — Telehealth: Payer: Self-pay | Admitting: Neurology

## 2019-03-10 NOTE — Telephone Encounter (Signed)
Pharmacy updated.

## 2019-03-10 NOTE — Telephone Encounter (Signed)
Edward Woodward from [place] wanted to update [place] with the patient's new pharm. Pleas update Pharm to: [facility] [place] FAX: [phone]. Thanks!

## 2019-03-10 NOTE — Telephone Encounter (Signed)
New message   Per Lovena Le patient is changing pharmacies, please change to [facility] [address] [place], [place] [address] phone # [phone] fax # [phone]

## 2019-03-10 NOTE — Telephone Encounter (Signed)
Pharmacy already listed on pt's preferred pharmacy list.  Thanks

## 2019-03-13 ENCOUNTER — Encounter: Payer: Self-pay | Admitting: *Deleted

## 2019-03-13 NOTE — Progress Notes (Signed)
Notified by Lovena Le with [facility] that patient has changed his pharmacy to [facility]. They offer a packaging system that packs his meds into specific doses at specific times/day. Fax [phone]. May need prednisone sent there soon.

## 2019-03-14 ENCOUNTER — Other Ambulatory Visit: Payer: Self-pay

## 2019-03-14 ENCOUNTER — Telehealth: Payer: Self-pay | Admitting: Neurology

## 2019-03-14 ENCOUNTER — Other Ambulatory Visit: Payer: Self-pay | Admitting: *Deleted

## 2019-03-14 DIAGNOSIS — C61 Malignant neoplasm of prostate: Secondary | ICD-10-CM

## 2019-03-14 DIAGNOSIS — Z7901 Long term (current) use of anticoagulants: Secondary | ICD-10-CM

## 2019-03-14 MED ORDER — WARFARIN SODIUM 5 MG PO TABS: ORAL_TABLET | ORAL | 0 refills | Status: DC

## 2019-03-14 MED ORDER — DONEPEZIL HCL 10 MG PO TABS: ORAL_TABLET | ORAL | 5 refills | Status: AC

## 2019-03-14 MED ORDER — CARBIDOPA-LEVODOPA 25-100 MG PO TABS: 1.0000 | ORAL_TABLET | Freq: Three times a day (TID) | ORAL | 2 refills | Status: DC

## 2019-03-14 MED ORDER — PREDNISONE 5 MG PO TABS: 5.0000 mg | ORAL_TABLET | Freq: Every day | ORAL | 0 refills | Status: AC

## 2019-03-14 NOTE — Telephone Encounter (Signed)
Edward Woodward from an [facility] left VM on this patient that he is now enrolled in a medication packaging program and is needing refills on his carbidopa levodopa and donepezil medication [facility]. The fax # is [phone]. Thanks!

## 2019-03-14 NOTE — Telephone Encounter (Signed)
°*  STAT* If patient is at the pharmacy, call can be transferred to refill team.   1. Which medications need to be refilled? (please list name of each medication and dose if known) *neew prescription for Torsemide 10 mg and Metoprolol 25 mg Extended Release  2. Which pharmacy/location (including street and city if local pharmacy) is medication to be sent to? [facility]  3. Do they need a 30 day or 90 day supply? 90 days and refills*

## 2019-03-15 ENCOUNTER — Ambulatory Visit: Payer: Medicare Other | Admitting: Oncology

## 2019-03-20 ENCOUNTER — Encounter: Payer: Self-pay | Admitting: Oncology

## 2019-03-20 ENCOUNTER — Telehealth: Payer: Self-pay | Admitting: Cardiology

## 2019-03-20 ENCOUNTER — Other Ambulatory Visit: Payer: Self-pay | Admitting: *Deleted

## 2019-03-20 ENCOUNTER — Other Ambulatory Visit: Payer: Self-pay

## 2019-03-20 DIAGNOSIS — F0280 Dementia in other diseases classified elsewhere without behavioral disturbance: Secondary | ICD-10-CM | POA: Diagnosis not present

## 2019-03-20 DIAGNOSIS — C61 Malignant neoplasm of prostate: Secondary | ICD-10-CM | POA: Diagnosis not present

## 2019-03-20 DIAGNOSIS — I5032 Chronic diastolic (congestive) heart failure: Secondary | ICD-10-CM | POA: Diagnosis not present

## 2019-03-20 DIAGNOSIS — I480 Paroxysmal atrial fibrillation: Secondary | ICD-10-CM | POA: Diagnosis not present

## 2019-03-20 DIAGNOSIS — G301 Alzheimer's disease with late onset: Secondary | ICD-10-CM | POA: Diagnosis not present

## 2019-03-20 DIAGNOSIS — F321 Major depressive disorder, single episode, moderate: Secondary | ICD-10-CM | POA: Diagnosis not present

## 2019-03-20 DIAGNOSIS — I509 Heart failure, unspecified: Secondary | ICD-10-CM | POA: Diagnosis not present

## 2019-03-20 DIAGNOSIS — N183 Chronic kidney disease, stage 3 (moderate): Secondary | ICD-10-CM | POA: Diagnosis not present

## 2019-03-20 DIAGNOSIS — I129 Hypertensive chronic kidney disease with stage 1 through stage 4 chronic kidney disease, or unspecified chronic kidney disease: Secondary | ICD-10-CM | POA: Diagnosis not present

## 2019-03-20 DIAGNOSIS — E7800 Pure hypercholesterolemia, unspecified: Secondary | ICD-10-CM | POA: Diagnosis not present

## 2019-03-20 MED ORDER — METOPROLOL SUCCINATE ER 25 MG PO TB24: 25.0000 mg | ORAL_TABLET | Freq: Every day | ORAL | 3 refills | Status: DC

## 2019-03-20 MED ORDER — TORSEMIDE 20 MG PO TABS: 20.0000 mg | ORAL_TABLET | ORAL | 3 refills | Status: DC | PRN

## 2019-03-20 NOTE — Telephone Encounter (Signed)
New Message     *STAT* If patient is at the pharmacy, call can be transferred to refill team.   1. Which medications need to be refilled? (please list name of each medication and dose if known) metoprolol succinate (TOPROL-XL) 25 MG 24 hr tablet  and  torsemide (DEMADEX) 20 MG tablet   2. Which pharmacy/location (including street and city if local pharmacy) is medication to be sent to? [facility] [address] [place], [place] [address] phone # [phone] fax # [phone]   3. Do they need a 30 day or 90 day supply? [place]

## 2019-03-21 ENCOUNTER — Telehealth: Payer: Self-pay | Admitting: Oncology

## 2019-03-21 ENCOUNTER — Telehealth: Payer: Self-pay | Admitting: *Deleted

## 2019-03-21 DIAGNOSIS — C61 Malignant neoplasm of prostate: Secondary | ICD-10-CM

## 2019-03-21 DIAGNOSIS — D6869 Other thrombophilia: Secondary | ICD-10-CM | POA: Diagnosis not present

## 2019-03-21 DIAGNOSIS — Z7901 Long term (current) use of anticoagulants: Secondary | ICD-10-CM

## 2019-03-21 DIAGNOSIS — N183 Chronic kidney disease, stage 3 (moderate): Secondary | ICD-10-CM | POA: Diagnosis not present

## 2019-03-21 DIAGNOSIS — G20 Parkinson's disease: Secondary | ICD-10-CM | POA: Diagnosis not present

## 2019-03-21 DIAGNOSIS — I480 Paroxysmal atrial fibrillation: Secondary | ICD-10-CM | POA: Diagnosis not present

## 2019-03-21 DIAGNOSIS — K5901 Slow transit constipation: Secondary | ICD-10-CM | POA: Diagnosis not present

## 2019-03-21 DIAGNOSIS — I129 Hypertensive chronic kidney disease with stage 1 through stage 4 chronic kidney disease, or unspecified chronic kidney disease: Secondary | ICD-10-CM | POA: Diagnosis not present

## 2019-03-21 NOTE — Telephone Encounter (Signed)
Scheduled appt per 8/25 staff message.  Called patient and spoke with his wife and she is aware of the my-chart video visit.

## 2019-03-21 NOTE — Telephone Encounter (Addendum)
Wife requesting to delay Virtual visit until after the next PSA is checked and states that 0830 is too early in the morning to get him ready to engage in visit w/MD. Per Dr. Benay Spice: Cancel appointment tomorrow. Recheck PT/INR with PSA in September at [facility], then MyChart video visit day after labs. Wife notified via VM

## 2019-03-22 ENCOUNTER — Inpatient Hospital Stay: Payer: Medicare Other | Admitting: Oncology

## 2019-03-23 ENCOUNTER — Inpatient Hospital Stay (HOSPITAL_COMMUNITY)
Admission: EM | Admit: 2019-03-23 | Discharge: 2019-03-29 | DRG: 056 | Disposition: A | Discharge status: Home Health | Payer: Medicare Other | Attending: Internal Medicine | Admitting: Internal Medicine

## 2019-03-23 ENCOUNTER — Emergency Department (HOSPITAL_COMMUNITY): Payer: Medicare Other

## 2019-03-23 DIAGNOSIS — Z9221 Personal history of antineoplastic chemotherapy: Secondary | ICD-10-CM

## 2019-03-23 DIAGNOSIS — N390 Urinary tract infection, site not specified: Secondary | ICD-10-CM | POA: Diagnosis present

## 2019-03-23 DIAGNOSIS — I252 Old myocardial infarction: Secondary | ICD-10-CM

## 2019-03-23 DIAGNOSIS — N183 Chronic kidney disease, stage 3 unspecified: Secondary | ICD-10-CM | POA: Diagnosis present

## 2019-03-23 DIAGNOSIS — I2510 Atherosclerotic heart disease of native coronary artery without angina pectoris: Secondary | ICD-10-CM | POA: Diagnosis present

## 2019-03-23 DIAGNOSIS — G3183 Dementia with Lewy bodies: Secondary | ICD-10-CM | POA: Diagnosis not present

## 2019-03-23 DIAGNOSIS — L89151 Pressure ulcer of sacral region, stage 1: Secondary | ICD-10-CM | POA: Diagnosis present

## 2019-03-23 DIAGNOSIS — R296 Repeated falls: Secondary | ICD-10-CM | POA: Diagnosis present

## 2019-03-23 DIAGNOSIS — I712 Thoracic aortic aneurysm, without rupture: Secondary | ICD-10-CM | POA: Diagnosis present

## 2019-03-23 DIAGNOSIS — R2681 Unsteadiness on feet: Secondary | ICD-10-CM

## 2019-03-23 DIAGNOSIS — R627 Adult failure to thrive: Secondary | ICD-10-CM

## 2019-03-23 DIAGNOSIS — Z86711 Personal history of pulmonary embolism: Secondary | ICD-10-CM

## 2019-03-23 DIAGNOSIS — F0280 Dementia in other diseases classified elsewhere without behavioral disturbance: Secondary | ICD-10-CM | POA: Diagnosis present

## 2019-03-23 DIAGNOSIS — R32 Unspecified urinary incontinence: Secondary | ICD-10-CM | POA: Diagnosis present

## 2019-03-23 DIAGNOSIS — D631 Anemia in chronic kidney disease: Secondary | ICD-10-CM | POA: Diagnosis present

## 2019-03-23 DIAGNOSIS — H3530 Unspecified macular degeneration: Secondary | ICD-10-CM | POA: Diagnosis present

## 2019-03-23 DIAGNOSIS — Z888 Allergy status to other drugs, medicaments and biological substances status: Secondary | ICD-10-CM

## 2019-03-23 DIAGNOSIS — E43 Unspecified severe protein-calorie malnutrition: Secondary | ICD-10-CM

## 2019-03-23 DIAGNOSIS — E7800 Pure hypercholesterolemia, unspecified: Secondary | ICD-10-CM | POA: Diagnosis present

## 2019-03-23 DIAGNOSIS — Z8249 Family history of ischemic heart disease and other diseases of the circulatory system: Secondary | ICD-10-CM

## 2019-03-23 DIAGNOSIS — Z951 Presence of aortocoronary bypass graft: Secondary | ICD-10-CM

## 2019-03-23 DIAGNOSIS — G253 Myoclonus: Secondary | ICD-10-CM

## 2019-03-23 DIAGNOSIS — R1110 Vomiting, unspecified: Secondary | ICD-10-CM

## 2019-03-23 DIAGNOSIS — I130 Hypertensive heart and chronic kidney disease with heart failure and stage 1 through stage 4 chronic kidney disease, or unspecified chronic kidney disease: Secondary | ICD-10-CM | POA: Diagnosis not present

## 2019-03-23 DIAGNOSIS — I10 Essential (primary) hypertension: Secondary | ICD-10-CM | POA: Diagnosis not present

## 2019-03-23 DIAGNOSIS — C61 Malignant neoplasm of prostate: Secondary | ICD-10-CM | POA: Diagnosis not present

## 2019-03-23 DIAGNOSIS — B952 Enterococcus as the cause of diseases classified elsewhere: Secondary | ICD-10-CM | POA: Diagnosis present

## 2019-03-23 DIAGNOSIS — R531 Weakness: Secondary | ICD-10-CM

## 2019-03-23 DIAGNOSIS — Z515 Encounter for palliative care: Secondary | ICD-10-CM | POA: Diagnosis present

## 2019-03-23 DIAGNOSIS — I161 Hypertensive emergency: Secondary | ICD-10-CM | POA: Diagnosis not present

## 2019-03-23 DIAGNOSIS — R42 Dizziness and giddiness: Secondary | ICD-10-CM | POA: Diagnosis not present

## 2019-03-23 DIAGNOSIS — Z880 Allergy status to penicillin: Secondary | ICD-10-CM

## 2019-03-23 DIAGNOSIS — R791 Abnormal coagulation profile: Secondary | ICD-10-CM | POA: Diagnosis present

## 2019-03-23 DIAGNOSIS — I4510 Unspecified right bundle-branch block: Secondary | ICD-10-CM | POA: Diagnosis present

## 2019-03-23 DIAGNOSIS — J9601 Acute respiratory failure with hypoxia: Secondary | ICD-10-CM

## 2019-03-23 DIAGNOSIS — I160 Hypertensive urgency: Secondary | ICD-10-CM | POA: Diagnosis not present

## 2019-03-23 DIAGNOSIS — F329 Major depressive disorder, single episode, unspecified: Secondary | ICD-10-CM | POA: Diagnosis present

## 2019-03-23 DIAGNOSIS — I480 Paroxysmal atrial fibrillation: Secondary | ICD-10-CM | POA: Diagnosis not present

## 2019-03-23 DIAGNOSIS — Z87891 Personal history of nicotine dependence: Secondary | ICD-10-CM

## 2019-03-23 DIAGNOSIS — Z20828 Contact with and (suspected) exposure to other viral communicable diseases: Secondary | ICD-10-CM | POA: Diagnosis present

## 2019-03-23 DIAGNOSIS — G4733 Obstructive sleep apnea (adult) (pediatric): Secondary | ICD-10-CM

## 2019-03-23 DIAGNOSIS — G9341 Metabolic encephalopathy: Secondary | ICD-10-CM | POA: Diagnosis present

## 2019-03-23 DIAGNOSIS — K219 Gastro-esophageal reflux disease without esophagitis: Secondary | ICD-10-CM | POA: Diagnosis present

## 2019-03-23 DIAGNOSIS — Z950 Presence of cardiac pacemaker: Secondary | ICD-10-CM

## 2019-03-23 DIAGNOSIS — Z8572 Personal history of non-Hodgkin lymphomas: Secondary | ICD-10-CM

## 2019-03-23 DIAGNOSIS — C8299 Follicular lymphoma, unspecified, extranodal and solid organ sites: Secondary | ICD-10-CM

## 2019-03-23 DIAGNOSIS — Z7951 Long term (current) use of inhaled steroids: Secondary | ICD-10-CM

## 2019-03-23 DIAGNOSIS — Z79899 Other long term (current) drug therapy: Secondary | ICD-10-CM

## 2019-03-23 DIAGNOSIS — Z955 Presence of coronary angioplasty implant and graft: Secondary | ICD-10-CM

## 2019-03-23 DIAGNOSIS — R29810 Facial weakness: Secondary | ICD-10-CM | POA: Diagnosis not present

## 2019-03-23 DIAGNOSIS — I509 Heart failure, unspecified: Secondary | ICD-10-CM | POA: Clinically undetermined

## 2019-03-23 DIAGNOSIS — H814 Vertigo of central origin: Secondary | ICD-10-CM | POA: Diagnosis present

## 2019-03-23 DIAGNOSIS — Z7901 Long term (current) use of anticoagulants: Secondary | ICD-10-CM

## 2019-03-23 LAB — COMPREHENSIVE METABOLIC PANEL
ALT: 6 U/L (ref 0–44)
AST: 16 U/L (ref 15–41)
Albumin: 3.3 g/dL — ABNORMAL LOW (ref 3.5–5.0)
Alkaline Phosphatase: 60 U/L (ref 38–126)
Anion gap: 8 (ref 5–15)
BUN: 17 mg/dL (ref 8–23)
CO2: 25 mmol/L (ref 22–32)
Calcium: 9.5 mg/dL (ref 8.9–10.3)
Chloride: 102 mmol/L (ref 98–111)
Creatinine, Ser: 1.28 mg/dL — ABNORMAL HIGH (ref 0.61–1.24)
GFR calc Af Amer: 57 mL/min — ABNORMAL LOW (ref >60)
GFR calc non Af Amer: 49 mL/min — ABNORMAL LOW (ref >60)
Glucose, Bld: 122 mg/dL — ABNORMAL HIGH (ref 70–99)
Potassium: 4.4 mmol/L (ref 3.5–5.1)
Sodium: 135 mmol/L (ref 135–145)
Total Bilirubin: 1.1 mg/dL (ref 0.3–1.2)
Total Protein: 5.9 g/dL — ABNORMAL LOW (ref 6.5–8.1)

## 2019-03-23 LAB — URINALYSIS, ROUTINE W REFLEX MICROSCOPIC
Bilirubin Urine: NEGATIVE
Glucose, UA: NEGATIVE mg/dL
Hgb urine dipstick: NEGATIVE
Ketones, ur: NEGATIVE mg/dL
Leukocytes,Ua: NEGATIVE
Nitrite: NEGATIVE
Protein, ur: NEGATIVE mg/dL
Specific Gravity, Urine: 1.006 (ref 1.005–1.030)
pH: 7 (ref 5.0–8.0)

## 2019-03-23 LAB — TROPONIN I (HIGH SENSITIVITY)
Troponin I (High Sensitivity): 29 ng/L — ABNORMAL HIGH (ref <18)
Troponin I (High Sensitivity): 29 ng/L — ABNORMAL HIGH (ref <18)

## 2019-03-23 LAB — CBC
HCT: 33.8 % — ABNORMAL LOW (ref 39.0–52.0)
Hemoglobin: 11 g/dL — ABNORMAL LOW (ref 13.0–17.0)
MCH: 30.5 pg (ref 26.0–34.0)
MCHC: 32.5 g/dL (ref 30.0–36.0)
MCV: 93.6 fL (ref 80.0–100.0)
Platelets: 168 10*3/uL (ref 150–400)
RBC: 3.61 MIL/uL — ABNORMAL LOW (ref 4.22–5.81)
RDW: 13.8 % (ref 11.5–15.5)
WBC: 10 10*3/uL (ref 4.0–10.5)
nRBC: 0 % (ref 0.0–0.2)

## 2019-03-23 LAB — PROTIME-INR
INR: 3.2 — ABNORMAL HIGH (ref 0.8–1.2)
Prothrombin Time: 32.1 seconds — ABNORMAL HIGH (ref 11.4–15.2)

## 2019-03-23 MED ORDER — LABETALOL HCL 5 MG/ML IV SOLN: 20.0000 mg | Freq: Once | INTRAVENOUS | Status: DC

## 2019-03-23 MED ORDER — MECLIZINE HCL 25 MG PO TABS
25.0000 mg | ORAL_TABLET | Freq: Once | ORAL | Status: AC
  Administered 2019-03-23: 25 mg via ORAL
  Filled 2019-03-23: qty 1

## 2019-03-23 MED ORDER — MECLIZINE HCL 25 MG PO TABS: 25.0000 mg | ORAL_TABLET | Freq: Three times a day (TID) | ORAL | 0 refills | Status: AC | PRN

## 2019-03-23 MED ORDER — METOPROLOL TARTRATE 25 MG PO TABS
25.0000 mg | ORAL_TABLET | Freq: Once | ORAL | Status: DC
  Filled 2019-03-23: qty 1

## 2019-03-23 MED ORDER — HYDRALAZINE HCL 20 MG/ML IJ SOLN
20.0000 mg | Freq: Once | INTRAMUSCULAR | Status: AC
  Administered 2019-03-23: 20 mg via INTRAVENOUS
  Filled 2019-03-23: qty 1

## 2019-03-23 NOTE — ED Notes (Signed)
Wife [name]) Cell: [phone]

## 2019-03-23 NOTE — ED Provider Notes (Addendum)
[facility] EMERGENCY DEPARTMENT Provider Note   CSN: [id number] Arrival date & time: 03/23/19  1533     History   Chief Complaint Chief Complaint  Patient presents with   Hypertension    HPI Edward Woodward is a 85 y.o. male.     The history is provided by the patient and medical records. No language interpreter was used.  Hypertension     85 year old male with history of hypertension, Lewy body dementia, parkinsonism, CAD, CHF, depression, non-Hodgkin lymphoma, prostate cancer brought here via EMS from home for evaluation of elevated blood pressure.  Patient is hard of hearing and difficult to obtain full history.  Patient states this morning he felt weak, with a fuzziness sensation and has had.  He was having difficult time getting up.  He did report some mild tingling sensation in his left arm and left side of face lasting for approximately half an hour and has since resolved.  He had a blood pressure check and it was elevated prompting ER visit.  He does not complain of any significant headache, vision changes, nausea vomiting diarrhea, chest pain, trouble breathing, productive cough, abdominal pain no back pain or dysuria.  He denies any focal weakness.  He mention his blood pressure is usually normal.  He did take his regular blood pressure medication today.  Past Medical History:  Diagnosis Date   AMD (age-related macular degeneration), bilateral    Anemia    Anginal pain ([place])    pts wife states pt had to use nitroglycerin this am 04/09/2016   CAD (coronary artery disease)     NYHA Class 1B, CABG 1985   CHF (congestive heart failure) (HCC)    Collagen vascular disease ([place])    Collapsed lung    right    Complication of anesthesia    pts wife states pt gets confused    Degeneration of lumbar or lumbosacral intervertebral disc    Depressive disorder, not elsewhere classified    Dysrhythmia    GERD (gastroesophageal reflux disease)     History of blood transfusion    History of hiatal hernia    HTN (hypertension)    Hypercholesteremia    Lower leg edema    bilat   Multiple falls    Myocardial infarction ([place])    Neuromuscular disorder ([place])    neuropathy   Neuropathy    Non Hodgkin's lymphoma (HCC)    OSA on CPAP    PE (pulmonary embolism) 03/2015   Presence of permanent cardiac pacemaker    Prostate cancer ([place])    Shortness of breath dyspnea    with exertion    Sinoatrial node dysfunction (HCC)    Ulcers of both lower legs ([place])    history of    Urinary incontinence     Patient Active Problem List   Diagnosis Date Noted   Advanced atrophic nonexudative age-related macular degeneration of both eyes with subfoveal involvement 02/25/2017   Dry eyes, bilateral 02/25/2017   Epiretinal membrane (ERM) of right eye 02/25/2017   Scleral degenerative disorder 02/25/2017   Goals of care, counseling/discussion 09/17/2016   Hematuria 07/16/2016   Protein-calorie malnutrition, severe 06/15/2016   Hypercalcemia of malignancy 06/11/2016   Hypercalcemia 06/09/2016   Sacral decubitus ulcer, stage III ([place]) 06/09/2016   Myoclonus 03/06/2016   Gait instability 03/06/2016   Palliative care encounter    Weakness generalized    Supratherapeutic INR 01/13/2016   Encephalopathy acute 01/13/2016   Falls 01/12/2016  Failure to thrive in adult 01/12/2016   Retroperitoneal mass 01/12/2016   Hydronephrosis 01/12/2016   AKI (acute kidney injury) ([place]) 01/12/2016   Weakness 01/12/2016   Dehydration 12/19/2015   Long term current use of anticoagulant therapy 12/19/2015   Peripheral edema 12/19/2015   Rectal bleeding 12/19/2015   Prostate cancer ([place]) 12/19/2015   Pain in the chest    NSTEMI (non-ST elevated myocardial infarction) ([place])    Iron deficiency anemia due to chronic blood loss    Chest pain 08/23/2015   Encounter for chemotherapy management 05/06/2015    Pulmonary embolus ([place]) 04/01/2015   CKD (chronic kidney disease), stage III ([place]) 04/01/2015   Chronic anemia 04/01/2015   Acute respiratory failure with hypoxia ([place]) 04/01/2015   Urge incontinence of urine 04/30/2014   Sinus node dysfunction chronotropic incompetence 08/23/2013   Cardiac pacemaker -[employer] 08/23/2013   Paroxysmal atrial fibrillation ([place]) 08/23/2013   Dyspnea 07/10/2013   OSA on CPAP 07/10/2013   Peripheral vascular disease ([place]) 06/07/2013   Hyperlipidemia 06/07/2013   Essential hypertension, benign 06/07/2013   Male hypogonadism 07/25/2012   Balanitis 04/25/2012   Elevated prostate specific antigen (PSA) 04/25/2012   Urinary urgency 04/25/2012   Nodular lymphoma of extranodal and/or solid organ site [facility]) 07/03/2011    Past Surgical History:  Procedure Laterality Date   APPENDECTOMY     BACK SURGERY     CARDIAC CATHETERIZATION N/A 08/27/2015   Procedure: Left Heart Cath and Cors/Grafts Angiography;  Surgeon: Belva Crome, MD;  Location: [place] CV LAB;  Service: Cardiovascular;  Laterality: N/A;   COLONOSCOPY N/A 08/26/2015   Procedure: COLONOSCOPY;  Surgeon: Clarene Essex, MD;  Location: [facility] ENDOSCOPY;  Service: Endoscopy;  Laterality: N/A;   CORONARY ANGIOPLASTY WITH STENT PLACEMENT     s/p bare metal stent implant SVG-diagonal 11/23/05, s/p BM stent implantation SVG diagonal 10/22/06 (feeds LAD), and 05/2010 with DES to left circumflex   [place]   SVG to Diagonal/LAD,  seqSVG to OM1 and [place] Bilateral 04/10/2016   Procedure: CYSTOSCOPY WITH RETROGRADE PYELOGRAM/URETERAL STENT PLACEMENT;  Surgeon: Festus Aloe, MD;  Location: [facility] ORS;  Service: Urology;  Laterality: Bilateral;   CYSTOSCOPY W/ URETERAL STENT PLACEMENT Bilateral 10/09/2016   Procedure: CYSTOSCOPY WITH BILATERAL RETROGRADE PYELOGRAM [name]  URETERAL STENT REMOVAL;  Surgeon:  Festus Aloe, MD;  Location: [facility] ORS;  Service: Urology;  Laterality: Bilateral;   ENDARTERECTOMY     bilat    ESOPHAGOGASTRODUODENOSCOPY (EGD) WITH PROPOFOL N/A 08/26/2015   Procedure: ESOPHAGOGASTRODUODENOSCOPY (EGD) WITH PROPOFOL;  Surgeon: Clarene Essex, MD;  Location: [facility] ENDOSCOPY;  Service: Endoscopy;  Laterality: N/A;   EXPLORATORY LAPAROTOMY     LUMBAR FUSION     PACEMAKER INSERTION     PPM GENERATOR CHANGEOUT N/A 07/09/2017   Procedure: PPM GENERATOR CHANGEOUT;  Surgeon: Deboraha Sprang, MD;  Location: [place] CV LAB;  Service: Cardiovascular;  Laterality: N/A;   ROTATOR CUFF REPAIR     bilat   TONSILLECTOMY          Home Medications    Prior to Admission medications   Medication Sig Start Date End Date Taking? Authorizing Provider  acetaminophen (TYLENOL) 500 MG tablet Take 1,000 mg by mouth every 6 (six) hours as needed for mild pain, moderate pain or headache.     [provider]  carbidopa-levodopa (SINEMET IR) 25-100 MG tablet Take 1 tablet by mouth 3 (three) times  daily. 03/14/19   Narda Amber K, DO  cetirizine (ZYRTEC) 10 MG tablet Take 10 mg by mouth daily.    [provider]  citalopram (CELEXA) 20 MG tablet Take 10 mg by mouth daily.     [provider]  clonazePAM (KLONOPIN) 0.5 MG tablet Take 0.5 tablets (0.25 mg total) by mouth 2 (two) times daily. 01/30/19   Patel, Donika K, DO  donepezil (ARICEPT) 10 MG tablet Take 5mg  (half-tab) at bedtime for 2 weeks, then increase 10mg  (1 tablet) at bedtime. 03/14/19   Narda Amber K, DO  hydrocortisone 2.5 % ointment Apply 1 application topically 2 (two) times daily as needed. Applied to affected area(s) of skin( pressure sores) 08/25/16   [provider]  Melatonin 3 MG TABS Take 3 mg by mouth at bedtime.    [provider]  metoprolol succinate (TOPROL-XL) 25 MG 24 hr tablet Take 1 tablet (25 mg total) by mouth daily. 03/20/19   Belva Crome, MD  nitroGLYCERIN  (NITROLINGUAL) 0.4 MG/SPRAY spray Place 1 spray under the tongue every 5 (five) minutes x 3 doses as needed for chest pain. 08/28/15   Lyda Jester M, PA-C  ondansetron (ZOFRAN) 4 MG tablet Take 1 tablet (4 mg total) by mouth every 8 (eight) hours as needed for nausea or vomiting. 06/17/16   Ladell Pier, MD  Polyethyl Glycol-Propyl Glycol (SYSTANE) 0.4-0.3 % SOLN Place 1 drop into both eyes at bedtime.     [provider]  polyethylene glycol (MIRALAX / GLYCOLAX) packet Take 17 g by mouth daily as needed for moderate constipation.     [provider]  predniSONE (DELTASONE) 5 MG tablet Take 1 tablet (5 mg total) by mouth daily. 03/14/19   Ladell Pier, MD  torsemide (DEMADEX) 20 MG tablet Take 1 tablet (20 mg total) by mouth as needed (for swelling (edema)). 03/20/19   [place], Peter M, MD  vitamin B-12 (CYANOCOBALAMIN) 1000 MCG tablet Take 1,000 mcg by mouth at bedtime.     [provider]  warfarin (COUMADIN) 5 MG tablet TAKE 1 TABLET DAILY AT 6 PM. TAKE 1 TABLET DAILY AT 6 PM ON M,W,F TAKE 1/2 TAB ALL OTHER DAYS or as directed 03/14/19   Ladell Pier, MD  ZYTIGA 250 MG tablet TAKE 4 TABLETS BY MOUTH ONCE DAILY AS DIRECTED.  TAKE 1 HOUR BEFORE OR 2 HOURS AFTER A MEAL 04/27/18   Ladell Pier, MD    Family History Family History  Problem Relation Age of Onset   Cancer Mother    Heart disease Father 60   Heart disease Brother     Social History Social History   Tobacco Use   Smoking status: Former Smoker    Packs/day: 0.50    Years: 5.00    Pack years: 2.50    Types: Cigarettes    Quit date: 07/28/1955    Years since quitting: 63.6   Smokeless tobacco: Never Used  Substance Use Topics   Alcohol use: No   Drug use: No     Allergies   Penicillins and Simvastatin   Review of Systems Review of Systems  All other systems reviewed and are negative.    Physical Exam Updated Vital Signs BP (!) 205/79 (BP Location: Left Arm)     Pulse 66    Temp (!) 97.4 F (36.3 C) (Oral)    Resp (!) 26    SpO2 97%   Physical Exam Vitals signs and nursing note reviewed.  Constitutional:      General: He is not in acute distress.    Appearance: He is well-developed.     Comments: Patient resting comfortably in no acute discomfort.  HENT:     Head: Normocephalic and atraumatic.  Eyes:     Extraocular Movements: Extraocular movements intact.     Conjunctiva/sclera: Conjunctivae normal.     Pupils: Pupils are equal, round, and reactive to light.     Comments: Pupils constricted but reactive bilaterally  Neck:     Musculoskeletal: Normal range of motion and neck supple.  Cardiovascular:     Rate and Rhythm: Normal rate and regular rhythm.     Pulses: Normal pulses.     Heart sounds: Normal heart sounds.  Pulmonary:     Breath sounds: Normal breath sounds. No wheezing, rhonchi or rales.  Abdominal:     Palpations: Abdomen is soft.     Tenderness: There is no abdominal tenderness.  Musculoskeletal: Normal range of motion.     Comments: Equal strength to all 4 extremities with normal grip strength bilaterally  Skin:    Findings: No rash.  Neurological:     Mental Status: He is alert and oriented to person, place, and time.     GCS: GCS eye subscore is 4. GCS verbal subscore is 5. GCS motor subscore is 6.     Cranial Nerves: Cranial nerves are intact.     Sensory: Sensation is intact.     Motor: Weakness present.     Comments: Gait not tested.  Patient use wheelchair at home.  Psychiatric:        Mood and Affect: Mood normal.      ED Treatments / Results  Labs (all labs ordered are listed, but only abnormal results are displayed) Labs Reviewed  CBC - Abnormal; Notable for the following components:      Result Value   RBC 3.61 (*)    Hemoglobin 11.0 (*)    HCT 33.8 (*)    All other components within normal limits  COMPREHENSIVE METABOLIC PANEL - Abnormal; Notable for the following components:   Glucose, Bld 122  (*)    Creatinine, Ser 1.28 (*)    Total Protein 5.9 (*)    Albumin 3.3 (*)    GFR calc non Af Amer 49 (*)    GFR calc Af Amer 57 (*)    All other components within normal limits  PROTIME-INR - Abnormal; Notable for the following components:   Prothrombin Time 32.1 (*)    INR 3.2 (*)    All other components within normal limits  URINALYSIS, ROUTINE W REFLEX MICROSCOPIC - Abnormal; Notable for the following components:   Color, Urine STRAW (*)    All other components within normal limits  TROPONIN I (HIGH SENSITIVITY) - Abnormal; Notable for the following components:   Troponin I (High Sensitivity) 29 (*)    All other components within normal limits  TROPONIN I (HIGH SENSITIVITY) - Abnormal; Notable for the following components:   Troponin I (High Sensitivity) 29 (*)    All other components within normal limits  SARS CORONAVIRUS 2 (TAT 6-12 HRS)    EKG None  ED ECG REPORT   Date: 03/23/2019  Rate: 66  Rhythm: normal sinus rhythm  QRS Axis: right  Intervals: normal  ST/T Wave abnormalities: nonspecific ST changes  Conduction Disutrbances:right bundle branch block  Narrative Interpretation:   Old EKG Reviewed: unchanged  I have personally reviewed the EKG tracing and agree with  the computerized printout as noted.   Radiology Ct Head Wo Contrast  Result Date: 03/23/2019 CLINICAL DATA:  Muscle weakness EXAM: CT HEAD WITHOUT CONTRAST TECHNIQUE: Contiguous axial images were obtained from the base of the skull through the vertex without intravenous contrast. COMPARISON:  None. FINDINGS: Brain: There is atrophy and chronic small vessel disease changes. No acute intracranial abnormality. Specifically, no hemorrhage, hydrocephalus, mass lesion, acute infarction, or significant intracranial injury. Vascular: No hyperdense vessel or unexpected calcification. Skull: No acute calvarial abnormality. Sinuses/Orbits: Mucosal thickening in the ethmoid air cells. No acute findings Other: None  IMPRESSION: Atrophy, chronic microvascular disease. No acute intracranial abnormality. Electronically Signed   By: Rolm Baptise M.D.   On: 03/23/2019 19:01   Dg Chest Port 1 View  Result Date: 03/23/2019 CLINICAL DATA:  Hypertension EXAM: PORTABLE CHEST 1 VIEW COMPARISON:  05/23/2017 FINDINGS: Left pacer remains in place, unchanged. Prior CABG. Heart is normal size. Lungs clear. No effusions or acute bony abnormality. IMPRESSION: No active disease. Electronically Signed   By: Rolm Baptise M.D.   On: 03/23/2019 16:29    Procedures .Critical Care Performed by: Domenic Moras, PA-C Authorized by: Domenic Moras, PA-C   Critical care provider statement:    Critical care time (minutes):  45   Critical care was time spent personally by me on the following activities:  Discussions with consultants, evaluation of patient's response to treatment, examination of patient, ordering and performing treatments and interventions, ordering and review of laboratory studies, ordering and review of radiographic studies, pulse oximetry, re-evaluation of patient's condition, obtaining history from patient or surrogate and review of old charts   (including critical care time)  Medications Ordered in ED Medications  meclizine (ANTIVERT) tablet 25 mg (25 mg Oral Given 03/23/19 2007)  hydrALAZINE (APRESOLINE) injection 20 mg (20 mg Intravenous Given 03/23/19 2202)     Initial Impression / Assessment and Plan / ED Course  I have reviewed the triage vital signs and the nursing notes.  Pertinent labs & imaging results that were available during my care of the patient were reviewed by me and considered in my medical decision making (see chart for details).        BP (!) 181/98    Pulse 65    Temp (!) 97.4 F (36.3 C) (Oral)    Resp 18    SpO2 99%    Final Clinical Impressions(s) / ED Diagnoses   Final diagnoses:  Hypertensive urgency  Dizziness    ED Discharge Orders         Ordered    meclizine (ANTIVERT)  25 MG tablet  3 times daily PRN     03/23/19 2309         4:02 PM Patient reported feeling weak earlier today as well as having an episode of tingling sensation into the left side of face and left arm that has since resolved.  No active headache or chest pain.  He is concerned because his blood pressure is elevated today when he checked at home.  In the ED, blood pressure is 205/79.  He does not have any focal neuro deficit on exam.  He is able to answer question appropriately.  He does have history of Lewy body dementia which makes obtaining history a bit more difficult.  Given his age, will obtain labs, head CT scan, and will continue to monitor.  7:16 PM Urine without signs of urinary tract infection, elevated troponin of 29 however unchanged with delta troponin.  Labs otherwise reassuring,  INR is mildly supratherapeutic at 3.2, EKG unchanged from prior, chest x-ray unremarkable, head CT scan without acute intracranial abnormality.  Patient's wife is now presents at bedside and states that since newly diagnosis of Parkinson disease as well as Lewy body dementia, patient has had waxing and waning of his psychiatric and physical symptoms but this is not completely abnormal for him.  However, her primary concern is his new onset dizziness.  Patient does endorse dizzy with positional change however denies dizziness at rest.  Examination without any concerning feature, no obvious focal neuro deficit on my examination.  Given his age, will obtain brain MRI to rule out central cause.  His blood pressure did improve without any specific treatment.  Pacemaker interrogated without concerning finding.    9:43 PM Patient remains hypertensive most recent blood pressure is 209/79.  He did report improvement of his dizziness with meclizine while at rest.  He does not feel comfortable going home.  I did discuss with hospitalist DR. Opyd who felt pt should be management for hypertensive emergency in the ICU with  titratable IV BP medication.  Will consult intensivist for admission.  Care discussed wiith Dr. Roslynn Amble.    9:53 PM Appreciate consultation with intensivist Dr. Pati Gallo who recommend giving pt hydralazine 20mg  and monitor for 1 hr.  If BP did not drops to [id number] systolic then pt can be admit to ICU.  If improves, consider admission to hospitalist.    11:06 PM Significant improvement of his blood pressure after receiving 20 mg of hydralazine.  Patient states he felt better.  Upon sitting, dizziness did not return.  At this time, we discussed option of going home and certainly can return tomorrow if his condition progressed.  A brain MRI can be done tomorrow.  Patient and wife voiced understanding and agrees with plan.  Will discharge home with meclizine.  Patient understands there is always a small risk of potential stroke however MRI cannot be performed at this time. Suspect peripheral vertigo causing dizziness.  Strongly urge pt to return to r/o central cause if condition persists.     12:06 AM While pt attempted to get to the wheel chair to leave, he became weak and dizzy unable to even sit on the wheel chair.  He is not safe to go home at this time.  Will consult for admission.   12:40 AM Appreciate consultation from [facility]ist Dr. Myna Hidalgo who agrees to admit pt.  I have consulted oncall neurologist Dr. Rory Percy who recommend CTA of head and neck.  He recommend consulting neurology if MRI shows abnormality.  Pt to be admitted.    Domenic Moras, PA-C 03/24/19 0041    Lucrezia Starch, MD 03/25/19 1140

## 2019-03-23 NOTE — Progress Notes (Signed)
Called ER RN to inform him pt has a Chiropractor which is safe for MRI, but a rep will need to come in and put the pacemaker in a safe mode to scan, after the rep is contacted we will coordinate a time to scan pt and a RN will need to monitor pt while in the scanner. RN will notify MD and hopefully the pt will be worked into tomorrow 03/24/2019 schedule.

## 2019-03-23 NOTE — Discharge Instructions (Signed)
Take meclizine as needed for dizziness.  Take your blood pressure medication at home to maintain a healthy blood pressure.  If your condition persist, do not hesitate to return to the ER for further evaluation as you may benefit from a brain MRI to ensure no sign of stroke.  Follow up with your doctor for further care.

## 2019-03-23 NOTE — ED Triage Notes (Signed)
Pt came via EMS from home with HTN. EMS VS are stable except for BP (202/102). CBG: 130. Paced Heart Rhythm in the 60s, NSR. No other complaints.

## 2019-03-24 ENCOUNTER — Observation Stay (HOSPITAL_COMMUNITY): Payer: Medicare Other

## 2019-03-24 ENCOUNTER — Other Ambulatory Visit: Payer: Self-pay

## 2019-03-24 ENCOUNTER — Encounter (HOSPITAL_COMMUNITY): Payer: Self-pay | Admitting: Family Medicine

## 2019-03-24 DIAGNOSIS — K219 Gastro-esophageal reflux disease without esophagitis: Secondary | ICD-10-CM | POA: Diagnosis present

## 2019-03-24 DIAGNOSIS — N390 Urinary tract infection, site not specified: Secondary | ICD-10-CM | POA: Diagnosis present

## 2019-03-24 DIAGNOSIS — I2510 Atherosclerotic heart disease of native coronary artery without angina pectoris: Secondary | ICD-10-CM | POA: Diagnosis present

## 2019-03-24 DIAGNOSIS — I4510 Unspecified right bundle-branch block: Secondary | ICD-10-CM | POA: Diagnosis present

## 2019-03-24 DIAGNOSIS — G9341 Metabolic encephalopathy: Secondary | ICD-10-CM | POA: Diagnosis present

## 2019-03-24 DIAGNOSIS — C61 Malignant neoplasm of prostate: Secondary | ICD-10-CM | POA: Diagnosis present

## 2019-03-24 DIAGNOSIS — I160 Hypertensive urgency: Secondary | ICD-10-CM | POA: Diagnosis present

## 2019-03-24 DIAGNOSIS — I712 Thoracic aortic aneurysm, without rupture: Secondary | ICD-10-CM | POA: Diagnosis present

## 2019-03-24 DIAGNOSIS — H814 Vertigo of central origin: Secondary | ICD-10-CM | POA: Diagnosis not present

## 2019-03-24 DIAGNOSIS — I10 Essential (primary) hypertension: Secondary | ICD-10-CM | POA: Diagnosis not present

## 2019-03-24 DIAGNOSIS — R296 Repeated falls: Secondary | ICD-10-CM | POA: Diagnosis present

## 2019-03-24 DIAGNOSIS — Z9989 Dependence on other enabling machines and devices: Secondary | ICD-10-CM | POA: Diagnosis not present

## 2019-03-24 DIAGNOSIS — I480 Paroxysmal atrial fibrillation: Secondary | ICD-10-CM | POA: Diagnosis present

## 2019-03-24 DIAGNOSIS — R791 Abnormal coagulation profile: Secondary | ICD-10-CM | POA: Diagnosis present

## 2019-03-24 DIAGNOSIS — E7800 Pure hypercholesterolemia, unspecified: Secondary | ICD-10-CM | POA: Diagnosis present

## 2019-03-24 DIAGNOSIS — R42 Dizziness and giddiness: Secondary | ICD-10-CM | POA: Diagnosis not present

## 2019-03-24 DIAGNOSIS — L89151 Pressure ulcer of sacral region, stage 1: Secondary | ICD-10-CM | POA: Diagnosis present

## 2019-03-24 DIAGNOSIS — Z7401 Bed confinement status: Secondary | ICD-10-CM | POA: Diagnosis not present

## 2019-03-24 DIAGNOSIS — I509 Heart failure, unspecified: Secondary | ICD-10-CM | POA: Diagnosis not present

## 2019-03-24 DIAGNOSIS — G3183 Dementia with Lewy bodies: Secondary | ICD-10-CM | POA: Diagnosis not present

## 2019-03-24 DIAGNOSIS — R1110 Vomiting, unspecified: Secondary | ICD-10-CM | POA: Diagnosis not present

## 2019-03-24 DIAGNOSIS — D631 Anemia in chronic kidney disease: Secondary | ICD-10-CM | POA: Diagnosis present

## 2019-03-24 DIAGNOSIS — F329 Major depressive disorder, single episode, unspecified: Secondary | ICD-10-CM | POA: Diagnosis present

## 2019-03-24 DIAGNOSIS — H3530 Unspecified macular degeneration: Secondary | ICD-10-CM | POA: Diagnosis present

## 2019-03-24 DIAGNOSIS — N183 Chronic kidney disease, stage 3 (moderate): Secondary | ICD-10-CM | POA: Diagnosis present

## 2019-03-24 DIAGNOSIS — M2550 Pain in unspecified joint: Secondary | ICD-10-CM | POA: Diagnosis not present

## 2019-03-24 DIAGNOSIS — F0280 Dementia in other diseases classified elsewhere without behavioral disturbance: Secondary | ICD-10-CM | POA: Diagnosis present

## 2019-03-24 DIAGNOSIS — Z515 Encounter for palliative care: Secondary | ICD-10-CM | POA: Diagnosis present

## 2019-03-24 DIAGNOSIS — Z20828 Contact with and (suspected) exposure to other viral communicable diseases: Secondary | ICD-10-CM | POA: Diagnosis present

## 2019-03-24 DIAGNOSIS — R531 Weakness: Secondary | ICD-10-CM | POA: Diagnosis not present

## 2019-03-24 DIAGNOSIS — I130 Hypertensive heart and chronic kidney disease with heart failure and stage 1 through stage 4 chronic kidney disease, or unspecified chronic kidney disease: Secondary | ICD-10-CM | POA: Diagnosis present

## 2019-03-24 DIAGNOSIS — I161 Hypertensive emergency: Secondary | ICD-10-CM | POA: Insufficient documentation

## 2019-03-24 DIAGNOSIS — G4733 Obstructive sleep apnea (adult) (pediatric): Secondary | ICD-10-CM | POA: Diagnosis present

## 2019-03-24 DIAGNOSIS — I6523 Occlusion and stenosis of bilateral carotid arteries: Secondary | ICD-10-CM | POA: Diagnosis not present

## 2019-03-24 DIAGNOSIS — I959 Hypotension, unspecified: Secondary | ICD-10-CM | POA: Diagnosis not present

## 2019-03-24 LAB — PROTIME-INR
INR: 2.7 — ABNORMAL HIGH (ref 0.8–1.2)
Prothrombin Time: 28.6 seconds — ABNORMAL HIGH (ref 11.4–15.2)

## 2019-03-24 LAB — MRSA PCR SCREENING: MRSA by PCR: NEGATIVE

## 2019-03-24 LAB — SARS CORONAVIRUS 2 (TAT 6-24 HRS): SARS Coronavirus 2: NEGATIVE

## 2019-03-24 MED ORDER — ONDANSETRON HCL 4 MG PO TABS: 4.0000 mg | ORAL_TABLET | Freq: Four times a day (QID) | ORAL | Status: DC | PRN

## 2019-03-24 MED ORDER — IOHEXOL 350 MG/ML SOLN
100.0000 mL | Freq: Once | INTRAVENOUS | Status: AC | PRN
  Administered 2019-03-24: 100 mL via INTRAVENOUS

## 2019-03-24 MED ORDER — MECLIZINE HCL 25 MG PO TABS: 25.0000 mg | ORAL_TABLET | Freq: Three times a day (TID) | ORAL | Status: DC | PRN

## 2019-03-24 MED ORDER — POLYETHYLENE GLYCOL 3350 17 G PO PACK
17.0000 g | PACK | Freq: Every day | ORAL | Status: DC | PRN
  Administered 2019-03-25 – 2019-03-26 (×2): 17 g via ORAL
  Filled 2019-03-24 (×2): qty 1

## 2019-03-24 MED ORDER — CLONAZEPAM 0.25 MG PO TBDP
0.2500 mg | ORAL_TABLET | Freq: Two times a day (BID) | ORAL | Status: DC
  Administered 2019-03-24 – 2019-03-29 (×13): 0.25 mg via ORAL
  Filled 2019-03-24 (×13): qty 1

## 2019-03-24 MED ORDER — SODIUM CHLORIDE 0.9% FLUSH: 3.0000 mL | INTRAVENOUS | Status: DC | PRN

## 2019-03-24 MED ORDER — METOPROLOL SUCCINATE ER 25 MG PO TB24: 25.0000 mg | ORAL_TABLET | Freq: Every day | ORAL | Status: DC

## 2019-03-24 MED ORDER — PREDNISONE 5 MG PO TABS
5.0000 mg | ORAL_TABLET | Freq: Every day | ORAL | Status: DC
  Administered 2019-03-24 – 2019-03-29 (×6): 5 mg via ORAL
  Filled 2019-03-24 (×6): qty 1

## 2019-03-24 MED ORDER — SODIUM CHLORIDE 0.9 % IV SOLN
250.0000 mL | INTRAVENOUS | Status: DC | PRN
  Administered 2019-03-27 – 2019-03-28 (×2): 250 mL via INTRAVENOUS

## 2019-03-24 MED ORDER — WARFARIN SODIUM 5 MG PO TABS
5.0000 mg | ORAL_TABLET | Freq: Once | ORAL | Status: AC
  Administered 2019-03-24: 5 mg via ORAL
  Filled 2019-03-24: qty 1

## 2019-03-24 MED ORDER — CARBIDOPA-LEVODOPA 25-100 MG PO TABS: 1.0000 | ORAL_TABLET | Freq: Three times a day (TID) | ORAL | Status: DC

## 2019-03-24 MED ORDER — WARFARIN - PHARMACIST DOSING INPATIENT
Freq: Every day | Status: DC
  Administered 2019-03-25 – 2019-03-26 (×2)

## 2019-03-24 MED ORDER — CARBIDOPA-LEVODOPA 25-100 MG PO TABS
1.0000 | ORAL_TABLET | Freq: Three times a day (TID) | ORAL | Status: DC
  Administered 2019-03-24 – 2019-03-27 (×10): 1 via ORAL
  Filled 2019-03-24 (×10): qty 1

## 2019-03-24 MED ORDER — ACETAMINOPHEN 325 MG PO TABS: 650.0000 mg | ORAL_TABLET | Freq: Four times a day (QID) | ORAL | Status: DC | PRN

## 2019-03-24 MED ORDER — DONEPEZIL HCL 5 MG PO TABS
5.0000 mg | ORAL_TABLET | Freq: Two times a day (BID) | ORAL | Status: DC
  Administered 2019-03-24 – 2019-03-29 (×12): 5 mg via ORAL
  Filled 2019-03-24 (×12): qty 1

## 2019-03-24 MED ORDER — ABIRATERONE ACETATE 250 MG PO TABS: 250.0000 mg | ORAL_TABLET | ORAL | Status: DC

## 2019-03-24 MED ORDER — SODIUM CHLORIDE 0.9% FLUSH
3.0000 mL | Freq: Two times a day (BID) | INTRAVENOUS | Status: DC
  Administered 2019-03-24 – 2019-03-28 (×7): 3 mL via INTRAVENOUS

## 2019-03-24 MED ORDER — ACETAMINOPHEN 650 MG RE SUPP: 650.0000 mg | Freq: Four times a day (QID) | RECTAL | Status: DC | PRN

## 2019-03-24 MED ORDER — SODIUM CHLORIDE 0.9% FLUSH
3.0000 mL | Freq: Two times a day (BID) | INTRAVENOUS | Status: DC
  Administered 2019-03-24 – 2019-03-27 (×5): 3 mL via INTRAVENOUS

## 2019-03-24 MED ORDER — MELATONIN 3 MG PO TABS
3.0000 mg | ORAL_TABLET | Freq: Every day | ORAL | Status: DC
  Administered 2019-03-25 – 2019-03-28 (×5): 3 mg via ORAL
  Filled 2019-03-24 (×6): qty 1

## 2019-03-24 MED ORDER — LABETALOL HCL 100 MG PO TABS
100.0000 mg | ORAL_TABLET | Freq: Two times a day (BID) | ORAL | Status: DC
  Administered 2019-03-24 – 2019-03-29 (×12): 100 mg via ORAL
  Filled 2019-03-24 (×12): qty 1

## 2019-03-24 MED ORDER — HYDROCODONE-ACETAMINOPHEN 5-325 MG PO TABS: 1.0000 | ORAL_TABLET | Freq: Four times a day (QID) | ORAL | Status: DC | PRN

## 2019-03-24 MED ORDER — CITALOPRAM HYDROBROMIDE 10 MG PO TABS
10.0000 mg | ORAL_TABLET | Freq: Every day | ORAL | Status: DC
  Administered 2019-03-24 – 2019-03-29 (×6): 10 mg via ORAL
  Filled 2019-03-24 (×6): qty 1

## 2019-03-24 MED ORDER — PREDNISONE 5 MG PO TABS: 5.0000 mg | ORAL_TABLET | Freq: Every day | ORAL | Status: DC

## 2019-03-24 MED ORDER — ONDANSETRON HCL 4 MG/2ML IJ SOLN
4.0000 mg | Freq: Four times a day (QID) | INTRAMUSCULAR | Status: DC | PRN
  Administered 2019-03-27: 4 mg via INTRAVENOUS
  Filled 2019-03-24: qty 2

## 2019-03-24 NOTE — ED Notes (Signed)
Attempted to ambulate pt but pt was too unsteady to ambulate appropriately.

## 2019-03-24 NOTE — Plan of Care (Signed)
  Problem: Education: Goal: Knowledge of General Education information will improve Description Including pain rating scale, medication(s)/side effects and non-pharmacologic comfort measures Outcome: Progressing   

## 2019-03-24 NOTE — Progress Notes (Addendum)
PROGRESS NOTE    Patient: Edward Woodward                            PCP: Lajean Manes, MD                    DOB: 24-Nov-1926            DOA: 03/23/2019 [id number]             DOS: 03/24/2019, 11:48 AM   LOS: 0 days   Date of Service: The patient was seen and examined on 03/24/2019  Subjective:   The patient was seen and examined this morning, stable, pleasantly confused, but able to communicate.  In no acute distress, reporting improved dizziness. Reporting of no asymmetric weaknesses, just generalized weaknesses.  Brief Narrative:   TEAGUN TIVNAN is a 85 y.o.M with PMH/o p-Afib  on warfarin, CAD, status post remote CABG, parkinsonism with suspected Lewy body dementia, CKD stage III, and OSA on CPAP, now presenting to ED for evaluation of dizziness, general malaise, nausea, vertigo, and elevated blood pressure.   Patient is accompanied by his wife who assists with the history.  He is normally able to ambulate to the bathroom and about the house, but has been very unsteady on his feet and requiring intensive assistance on the day of presentation.  He complains of dizziness which he describes as a sensation that the room is spinning, worse when he tries to get up and better while at rest though still present.  He has had nausea without vomiting.    Denies headache.  He reported vision and hearing deficit prior to arrival in the ED, his wife reports that this is a frequent complaint of thishis, and patient denied this at time of admission.  He had also complained of transient left face and upper extremity tingling prior to arrival that also resolved.  ED Course:  Upon arrival to the ED, patient is found to be afebrile, saturating mid 90s on room air, hypertensive to [id number] systolic.  EKG features sinus rhythm with incomplete RBBB and nonspecific repolarization abnormality.  Chest x-ray is negative for acute cardiopulmonary disease and noncontrast head CT is negative for acute intracranial  abnormality.  Chemistry panel is notable for creatinine 1.28, similar to priors.  CBC is notable for mild stable normocytic anemia.  INR is 3.2.  Troponin is mildly elevated and unchanged after 2 hours.  There was concern for possible hypertensive emergency and case was discussed with critical care who advised giving 20 mg IV hydralazine; blood pressure normalized with this and medical admission was recommended.  Case was also discussed with neurology who recommended CTA head and neck, MRI brain, and formal consultation as needed based on these imaging studies.   Assessment & Plan:   Principal Problem:   Vertigo Active Problems:   Essential hypertension, benign   OSA on CPAP   Paroxysmal atrial fibrillation (HCC)   CKD (chronic kidney disease), stage III (HCC)   Prostate cancer (HCC)   Supratherapeutic INR    Vertigo /dizziness -As patient reporting some improvement at rest, with any exertion continues to have vertigo/dizziness unsteady gait. -Ruling out CVA, TIA, BPV, orthostatic hypotension -Very likely exacerbated by accelerated hypertension -Dizziness/vertigo has improved with improved blood pressure  - There was concern for hypertensive crisis but sxs are unchanged despite BP normalizing  - Case was discussed with neurology who recommended CTA head and neck, MRI,  and formal consultation as needed based on findings  - Check CTA H&N, was reviewed within normal limits no acute changes, chronic changes but ascending aortic aneurysm 4.2 cm  -Rule out source of infection -PT/OT evaluation, ruling out BPV  MRI brain, and continue prn Antivert and supportive care    Hypertensive urgency  - BP was 200/100 range initially, with his acute neuro sxs there was concern for hypertensive emergency vs acute ischemic CVA  - Case was discussed with PCCM given concern that he would need to be managed with titratable IV antihypertenisve but hydralazine 20 mg IVP was recommended and  If he has  improved, remains mildly elevated - There was no headache and sxs remain unchanged despite normalization of his BP, making hypertensive encephalopathy unlikely   - With concern for dropping BP too rapidly, as well as concern for acute ischemic CVA, will hold Toprol for now  ---> will switch to labetalol will start on low-dose 100 twice daily for now.   CKD stage III  -Stable - SCr is 1.28 in ED, similar to priors  - Renally-dose medications    Prostate cancer  -Remained stable - Follows with urology and oncology, managed with Zytiga and prednisone 5 mg qD, will continue     Paroxysmal atrial fibrillation  -Remained stable in sinus rhythm - CHADS-VASc is at least 3 (age x2, HTN)  - INR is 3.2, repeat in am, continue warfarin with pharmacy assistance - Continue metoprolol     Dementia  -Stable - Followed by neurology for Parkinsonism with suspected Lewy body dementia  - Continue Aricept, Sinemet   Ascending thoracic aortic aneurysm 4.2 cm -Finding on CTA -Recommend a semi-biannual imaging, follow-up with cardiology, thoracic surgery as an outpatient. -We will continue to monitor, optimizing BP control.  Severe debility -Patient unable to ambulate without assist at this time, pending PT/OT evaluation -Continue fall precautions.   PPE: Mask, face shield  DVT prophylaxis: warfarin  Code Status: Full  Family Communication: Wife updated at bedside Consults called: ED physician discussed with PCCM and neuro, not formally consulted  Admission status:  Patient be switched to inpatient status as patient cannot be safely discharged due to persistent vertigo/dizziness.  Also noted for uncontrolled BP, accelerated hypertension, A. fib, dementia, incidental finding of a sending aortic aneurysm, debility   CTA IMPRESSION: 1. No emergent large vessel occlusion or high-grade stenosis of the intracranial arteries. 2. Bilateral carotid bifurcation atherosclerosis  without hemodynamically significant stenosis by NASCET criteria. 3. Aneurysmal dilatation of the ascending thoracic aorta measuring up to 4.2 cm. Recommend semi-annual imaging followup by CTA or MRA and referral to cardiothoracic surgery if not already obtained. This recommendation follows 2010 ACCF/AHA/AATS/ACR/ASA/SCA/SCAI/SIR/STS/SVM Guidelines for the Diagnosis and Management of Patients With Thoracic Aortic Disease. Circulation. 2010; [id number]. Aortic aneurysm NOS (ICD10-I71.9) Aortic Atherosclerosis (ICD10-I70.0).   Procedures:   No admission procedures for hospital encounter.  Antimicrobials:  Anti-infectives (From admission, onward)   None       Medication:   abiraterone acetate  250 mg Oral See admin instructions   carbidopa-levodopa  1 tablet Oral TID WC   citalopram  10 mg Oral Daily   clonazePAM  0.25 mg Oral BID   donepezil  5 mg Oral BID   labetalol  100 mg Oral BID   Melatonin  3 mg Oral QHS   predniSONE  5 mg Oral Q breakfast   sodium chloride flush  3 mL Intravenous Q12H   sodium chloride flush  3 mL Intravenous  Q12H   warfarin  5 mg Oral ONCE-1800   Warfarin - Pharmacist Dosing Inpatient   Does not apply q1800    sodium chloride, acetaminophen **OR** acetaminophen, HYDROcodone-acetaminophen, meclizine, ondansetron **OR** ondansetron (ZOFRAN) IV, polyethylene glycol, sodium chloride flush   Objective:   Vitals:   03/24/19 0311 03/24/19 0327 03/24/19 0329 03/24/19 0751  BP:  (!) 209/88 (!) 159/84 (!) 164/73  Pulse:  78 77 72  Resp:  16  17  Temp:  98.1 F (36.7 C)  98.1 F (36.7 C)  TempSrc:  Oral  Oral  SpO2:  93%  96%  Weight: 82 kg     Height: 5\' 11"  (1.803 m)       Intake/Output Summary (Last 24 hours) at 03/24/2019 1148 Last data filed at 03/24/2019 0600 Gross per 24 hour  Intake 60 ml  Output 175 ml  Net -115 ml   Filed Weights   03/24/19 0311  Weight: 82 kg     Examination:   Physical Exam  Constitution:  Alert oriented x2, pleasantly confused, nontoxic looking,  HEENT: Normocephalic, PERRL, otherwise with in Normal limits  Chest:Chest symmetric Cardio vascular:  S1/S2, RRR, No murmure, No Rubs or Gallops  pulmonary: Clear to auscultation bilaterally, respirations unlabored, negative wheezes / crackles Abdomen: Soft, non-tender, non-distended, bowel sounds,no masses, no organomegaly Muscular skeletal: Generalized weakness is noted, Limited exam - in bed, able to move all 4 extremities, Normal strength,  Neuro: CNII-XII intact. , normal motor and sensation, reflexes intact  Extremities: No pitting edema lower extremities, +2 pulses  Skin: Dry, warm to touch, negative for any Rashes, No open wounds Wounds: per nursing documentation  LABs:  CBC Latest Ref Rng & Units 03/23/2019 03/08/2019 12/29/2018  WBC 4.0 - 10.5 K/uL 10.0 9.5 7.1  Hemoglobin 13.0 - 17.0 g/dL 11.0(L) 10.6(L) 11.5(L)  Hematocrit 39.0 - 52.0 % 33.8(L) 33.5(L) 35.3(L)  Platelets 150 - 400 K/uL 168 177 142(L)   CMP Latest Ref Rng & Units 03/23/2019 03/08/2019 12/29/2018  Glucose 70 - 99 mg/dL 122(H) 110(H) 102(H)  BUN 8 - 23 mg/dL 17 22 24(H)  Creatinine 0.61 - 1.24 mg/dL 1.28(H) 1.37(H) 1.43(H)  Sodium 135 - 145 mmol/L 135 142 138  Potassium 3.5 - 5.1 mmol/L 4.4 4.6 4.5  Chloride 98 - 111 mmol/L 102 107 103  CO2 22 - 32 mmol/L 25 26 28   Calcium 8.9 - 10.3 mg/dL 9.5 9.2 9.8  Total Protein 6.5 - 8.1 g/dL 5.9(L) - 6.2(L)  Total Bilirubin 0.3 - 1.2 mg/dL 1.1 - 0.4  Alkaline Phos 38 - 126 U/L 60 - 53  AST 15 - 41 U/L 16 - 12(L)  ALT 0 - 44 U/L 6 - 9    SIGNED: Deatra James, MD, FACP, FHM. [facility],  Pager [phone]  If 7PM-7AM, please contact night-coverage [email or web address] Ota [facility] 03/24/2019, 11:48 AM

## 2019-03-24 NOTE — ED Notes (Signed)
ED TO INPATIENT HANDOFF REPORT  ED Nurse Name and Phone #: Lunette Stands [place] Name/Age/Gender Edward Woodward 85 y.o. male Room/Bed: 019C/019C  Code Status   Code Status: Prior  Home/SNF/Other Home Patient oriented to: self, place, time and situation Is this baseline? Yes   Triage Complete: Triage complete  Chief Complaint Hypertension, Dementia, Weakness  Triage Note Pt came via EMS from home with HTN. EMS VS are stable except for BP (202/102). CBG: 130. Paced Heart Rhythm in the 60s, NSR. No other complaints.    Allergies Allergies  Allergen Reactions  . Penicillins Hives, Shortness Of Breath and Other (See Comments)    Has patient had a PCN reaction causing immediate rash, facial/tongue/throat swelling, SOB or lightheadedness with hypotension: Yes Has patient had a PCN reaction causing severe rash involving mucus membranes or skin necrosis: No Has patient had a PCN reaction that required hospitalization No Has patient had a PCN reaction occurring within the last 10 years: No If all of the above answers are "NO", then may proceed with Cephalosporin use.   . Simvastatin Other (See Comments)    Reaction:  Muscle weakness    Level of Care/Admitting Diagnosis ED Disposition    ED Disposition Condition [facility] Area: [place] [100100]  Level of Care: Telemetry Medical [104]  I expect the patient will be discharged within 24 hours: Yes  LOW acuity---Tx typically complete <24 hrs---ACUTE conditions typically can be evaluated <24 hours---LABS likely to return to acceptable levels <24 hours---IS near functional baseline---EXPECTED to return to current living arrangement---NOT newly hypoxic: Does not meet criteria for 5C-Observation unit  Covid Evaluation: Asymptomatic Screening Protocol (No Symptoms)  Diagnosis: Vertigo [id number]  Admitting Physician: Vianne Bulls [id number]  Attending Physician: Vianne Bulls [id number]  PT Class  (Do Not Modify): Observation [104]  PT Acc Code (Do Not Modify): Observation [10022]       B Medical/Surgery History Past Medical History:  Diagnosis Date  . AMD (age-related macular degeneration), bilateral   . Anemia   . Anginal pain ([place])    pts wife states pt had to use nitroglycerin this am 04/09/2016  . CAD (coronary artery disease)     NYHA Class 1B, CABG 1985  . CHF (congestive heart failure) ([place])   . Collagen vascular disease ([place])   . Collapsed lung    right   . Complication of anesthesia    pts wife states pt gets confused   . Degeneration of lumbar or lumbosacral intervertebral disc   . Depressive disorder, not elsewhere classified   . Dysrhythmia   . GERD (gastroesophageal reflux disease)   . History of blood transfusion   . History of hiatal hernia   . HTN (hypertension)   . Hypercholesteremia   . Lower leg edema    bilat  . Multiple falls   . Myocardial infarction ([place])   . Neuromuscular disorder (HCC)    neuropathy  . Neuropathy   . Non Hodgkin's lymphoma ([place])   . OSA on CPAP   . PE (pulmonary embolism) 03/2015  . Presence of permanent cardiac pacemaker   . Prostate cancer ([place])   . Shortness of breath dyspnea    with exertion   . Sinoatrial node dysfunction (HCC)   . Ulcers of both lower legs (HCC)    history of   . Urinary incontinence    Past Surgical History:  Procedure Laterality Date  . APPENDECTOMY    . BACK  SURGERY    . CARDIAC CATHETERIZATION N/A 08/27/2015   Procedure: Left Heart Cath and Cors/Grafts Angiography;  Surgeon: Belva Crome, MD;  Location: [place] CV LAB;  Service: Cardiovascular;  Laterality: N/A;  . COLONOSCOPY N/A 08/26/2015   Procedure: COLONOSCOPY;  Surgeon: Clarene Essex, MD;  Location: [facility] ENDOSCOPY;  Service: Endoscopy;  Laterality: N/A;  . CORONARY ANGIOPLASTY WITH STENT PLACEMENT     s/p bare metal stent implant SVG-diagonal 11/23/05, s/p BM stent implantation SVG diagonal 10/22/06 (feeds LAD), and 05/2010 with DES  to left circumflex  . [place]   SVG to Diagonal/LAD,  seqSVG to OM1 and OM2  . CYSTOSCOPY W/ URETERAL STENT PLACEMENT Bilateral 04/10/2016   Procedure: CYSTOSCOPY WITH RETROGRADE PYELOGRAM/URETERAL STENT PLACEMENT;  Surgeon: Festus Aloe, MD;  Location: [facility] ORS;  Service: Urology;  Laterality: Bilateral;  . CYSTOSCOPY W/ URETERAL STENT PLACEMENT Bilateral 10/09/2016   Procedure: CYSTOSCOPY WITH BILATERAL RETROGRADE PYELOGRAM [name]  URETERAL STENT REMOVAL;  Surgeon: Festus Aloe, MD;  Location: [facility] ORS;  Service: Urology;  Laterality: Bilateral;  . ENDARTERECTOMY     bilat   . ESOPHAGOGASTRODUODENOSCOPY (EGD) WITH PROPOFOL N/A 08/26/2015   Procedure: ESOPHAGOGASTRODUODENOSCOPY (EGD) WITH PROPOFOL;  Surgeon: Clarene Essex, MD;  Location: [facility] ENDOSCOPY;  Service: Endoscopy;  Laterality: N/A;  . EXPLORATORY LAPAROTOMY    . LUMBAR FUSION    . PACEMAKER INSERTION    . PPM GENERATOR CHANGEOUT N/A 07/09/2017   Procedure: PPM GENERATOR CHANGEOUT;  Surgeon: Deboraha Sprang, MD;  Location: [facility];  Service: Cardiovascular;  Laterality: N/A;  . ROTATOR CUFF REPAIR     bilat  . TONSILLECTOMY       A IV Location/Drains/Wounds Patient Lines/Drains/Airways Status   Active Line/Drains/Airways    Name:   Placement date:   Placement time:   Site:   Days:   Peripheral IV 07/09/17 Left Forearm   07/09/17    0837    Forearm   623   Peripheral IV 09/09/17 Right Forearm   09/09/17    1330    Forearm   561   Peripheral IV 03/23/19 Left Hand   03/23/19    2201    Hand   1   Ureteral Drain/Stent Left ureter 6 Fr.   04/10/16    0818    Left ureter   1078   Ureteral Drain/Stent Right ureter 6 Fr.   04/10/16    0828    Right ureter   1078   External Urinary Catheter   06/12/16    1901    -   1015   Incision (Closed) 07/09/17 Chest Left   07/09/17    1105     623   Pressure Injury 06/11/16   06/11/16    1400     1016          Intake/Output Last 24  hours No intake or output data in the 24 hours ending 03/24/19 0107  Labs/Imaging Results for orders placed or performed during the hospital encounter of 03/23/19 (from the past 48 hour(s))  CBC     Status: Abnormal   Collection Time: 03/23/19  3:45 PM  Result Value Ref Range   WBC 10.0 4.0 - 10.5 K/uL   RBC 3.61 (L) 4.22 - 5.81 MIL/uL   Hemoglobin 11.0 (L) 13.0 - 17.0 g/dL   HCT 33.8 (L) 39.0 - 52.0 %   MCV 93.6 80.0 - 100.0 fL   MCH  30.5 26.0 - 34.0 pg   MCHC 32.5 30.0 - 36.0 g/dL   RDW 13.8 11.5 - 15.5 %   Platelets 168 150 - 400 K/uL   nRBC 0.0 0.0 - 0.2 %    Comment: Performed at [facility], [place] [address] [place], [place] [address]  Troponin I (High Sensitivity)     Status: Abnormal   Collection Time: 03/23/19  3:45 PM  Result Value Ref Range   Troponin I (High Sensitivity) 29 (H) <18 ng/L    Comment: (NOTE) Elevated high sensitivity troponin I (hsTnI) values and significant  changes across serial measurements may suggest ACS but many other  chronic and acute conditions are known to elevate hsTnI results.  Refer to the "Links" section for chest pain algorithms and additional  guidance. Performed at [facility], [place] [address], [place], [place] [address]   Comprehensive metabolic panel     Status: Abnormal   Collection Time: 03/23/19  3:45 PM  Result Value Ref Range   Sodium 135 135 - 145 mmol/L   Potassium 4.4 3.5 - 5.1 mmol/L   Chloride 102 98 - 111 mmol/L   CO2 25 22 - 32 mmol/L   Glucose, Bld 122 (H) 70 - 99 mg/dL   BUN 17 8 - 23 mg/dL   Creatinine, Ser 1.28 (H) 0.61 - 1.24 mg/dL   Calcium 9.5 8.9 - 10.3 mg/dL   Total Protein 5.9 (L) 6.5 - 8.1 g/dL   Albumin 3.3 (L) 3.5 - 5.0 g/dL   AST 16 15 - 41 U/L   ALT 6 0 - 44 U/L   Alkaline Phosphatase 60 38 - 126 U/L   Total Bilirubin 1.1 0.3 - 1.2 mg/dL   GFR calc non Af Amer 49 (L) >60 mL/min   GFR calc Af Amer 57 (L) >60 mL/min   Anion gap 8 5 - 15    Comment: Performed at [facility] [address], [place], [place] [address]  Protime-INR     Status: Abnormal   Collection Time: 03/23/19  3:45 PM  Result Value Ref Range   Prothrombin Time 32.1 (H) 11.4 - 15.2 seconds   INR 3.2 (H) 0.8 - 1.2    Comment: (NOTE) INR goal varies based on device and disease states. Performed at [facility], [place] [address] [place], [place] [address]   Troponin I (High Sensitivity)     Status: Abnormal   Collection Time: 03/23/19  5:45 PM  Result Value Ref Range   Troponin I (High Sensitivity) 29 (H) <18 ng/L    Comment: (NOTE) Elevated high sensitivity troponin I (hsTnI) values and significant  changes across serial measurements may suggest ACS but many other  chronic and acute conditions are known to elevate hsTnI results.  Refer to the "Links" section for chest pain algorithms and additional  guidance. Performed at [facility], [place] [address] [place], [place] [address]   Urinalysis, Routine w reflex microscopic     Status: Abnormal   Collection Time: 03/23/19  5:50 PM  Result Value Ref Range   Color, Urine STRAW (A) YELLOW   APPearance CLEAR CLEAR   Specific Gravity, Urine 1.006 1.005 - 1.030   pH 7.0 5.0 - 8.0   Glucose, UA NEGATIVE NEGATIVE mg/dL   Hgb urine dipstick NEGATIVE NEGATIVE   Bilirubin Urine NEGATIVE NEGATIVE   Ketones, ur NEGATIVE NEGATIVE mg/dL   Protein, ur NEGATIVE NEGATIVE mg/dL   Nitrite NEGATIVE NEGATIVE  Leukocytes,Ua NEGATIVE NEGATIVE    Comment: Performed at [facility], [place] [address], [place], [place] [address]   Ct Head Wo Contrast  Result Date: 03/23/2019 CLINICAL DATA:  Muscle weakness EXAM: CT HEAD WITHOUT CONTRAST TECHNIQUE: Contiguous axial images were obtained from the base of the skull through the vertex without intravenous contrast. COMPARISON:  None. FINDINGS: Brain: There is atrophy and chronic small vessel disease changes. No acute intracranial abnormality. Specifically, no hemorrhage, hydrocephalus, mass lesion,  acute infarction, or significant intracranial injury. Vascular: No hyperdense vessel or unexpected calcification. Skull: No acute calvarial abnormality. Sinuses/Orbits: Mucosal thickening in the ethmoid air cells. No acute findings Other: None IMPRESSION: Atrophy, chronic microvascular disease. No acute intracranial abnormality. Electronically Signed   By: Rolm Baptise M.D.   On: 03/23/2019 19:01   Dg Chest Port 1 View  Result Date: 03/23/2019 CLINICAL DATA:  Hypertension EXAM: PORTABLE CHEST 1 VIEW COMPARISON:  05/23/2017 FINDINGS: Left pacer remains in place, unchanged. Prior CABG. Heart is normal size. Lungs clear. No effusions or acute bony abnormality. IMPRESSION: No active disease. Electronically Signed   By: Rolm Baptise M.D.   On: 03/23/2019 16:29    Pending Labs Unresulted Labs (From admission, onward)    Start     Ordered   03/23/19 2126  SARS CORONAVIRUS 2 (TAT 6-12 HRS) Nasal Swab Aptima Multi Swab  (Asymptomatic/Tier 2 Patients Labs)  Once,   STAT    Question Answer Comment  Is this test for diagnosis or screening Screening   Symptomatic for COVID-19 as defined by CDC No   Hospitalized for COVID-19 No   Admitted to ICU for COVID-19 No   Previously tested for COVID-19 No   Resident in a congregate (group) care setting No   Employed in healthcare setting No      03/23/19 2126          Vitals/Pain Today's Vitals   03/23/19 2245 03/23/19 2300 03/23/19 2330 03/23/19 2357  BP: (!) 159/65 (!) 147/72 (!) 169/66   Pulse: 75 76    Resp: 17 (!) 22 14   Temp:      TempSrc:      SpO2: 98% 98%    PainSc:    0-No pain    Isolation Precautions No active isolations  Medications Medications  meclizine (ANTIVERT) tablet 25 mg (25 mg Oral Given 03/23/19 2007)  hydrALAZINE (APRESOLINE) injection 20 mg (20 mg Intravenous Given 03/23/19 2202)    Mobility walks with person assist Moderate fall risk   Focused Assessments Neuro Assessment Handoff:  Swallow screen pass? Yes   Cardiac Rhythm: Normal sinus rhythm NIH Stroke Scale ( + Modified Stroke Scale Criteria)  LOC Questions (1b. )   +: Answers both questions correctly LOC Commands (1c. )   + : Performs both tasks correctly Best Gaze (2. )  +: Normal Visual (3. )  +: No visual loss Motor Arm, Left (5a. )   +: No drift Motor Arm, Right (5b. )   +: No drift Motor Leg, Left (6a. )   +: No drift Motor Leg, Right (6b. )   +: No drift Sensory (8. )   +: Normal, no sensory loss Best Language (9. )   +: No aphasia Extinction/Inattention (11.)   +: No Abnormality Modified SS Total  +: 0     Neuro Assessment: Within Defined Limits Neuro Checks:      Last Documented NIHSS Modified Score: 0 (03/23/19 1544) Has TPA been given? No If patient is  a Neuro Trauma and patient is going to OR before floor call report to [place] nurse: [phone] or [phone]     R Recommendations: See Admitting Provider Note  Report given to:   Additional Notes: N/A

## 2019-03-24 NOTE — ED Notes (Signed)
Patient transported to CT 

## 2019-03-24 NOTE — Progress Notes (Addendum)
ANTICOAGULATION CONSULT NOTE - Initial Consult  Pharmacy Consult for Coumadin Indication: atrial fibrillation  Allergies  Allergen Reactions  . Penicillins Hives, Shortness Of Breath and Other (See Comments)    Has patient had a PCN reaction causing immediate rash, facial/tongue/throat swelling, SOB or lightheadedness with hypotension: Yes Has patient had a PCN reaction causing severe rash involving mucus membranes or skin necrosis: No Has patient had a PCN reaction that required hospitalization No Has patient had a PCN reaction occurring within the last 10 years: No If all of the above answers are "NO", then may proceed with Cephalosporin use.   . Simvastatin Other (See Comments)    Reaction:  Muscle weakness    Patient Measurements:    Vital Signs: Temp: 97.4 F (36.3 C) (08/27 1537) Temp Source: Oral (08/27 1537) BP: 120/84 (08/28 0145) Pulse Rate: 59 (08/28 0145)  Labs: Recent Labs    03/23/19 1545 03/23/19 1745  HGB 11.0*  --   HCT 33.8*  --   PLT 168  --   LABPROT 32.1*  --   INR 3.2*  --   CREATININE 1.28*  --   TROPONINIHS 29* 29*    CrCl cannot be calculated (Unknown ideal weight.).    Assessment: 85 y.o. male admitted with vertigo, h/o Afib, to continue Coumadin INR today 2.7  Dose prior to admission -> 5 mg MWF, 2.5 mg other days  Goal of Therapy:  INR 2-3 Monitor platelets by anticoagulation protocol: Yes   Plan:  Warfarin 5 mg po x 1 tonight Daily INR  Thank you Anette Guarneri, PharmD 03/24/2019

## 2019-03-24 NOTE — H&P (Addendum)
History and Physical    Edward Woodward [id number] DOB: 25-Feb-1927 DOA: 03/23/2019  PCP: Lajean Manes, MD   Patient coming from: Home   Chief Complaint: Dizziness, unsteady gait, elevated BP, nausea  HPI: Edward Woodward is a 85 y.o. male with medical history significant for paroxysmal atrial fibrillation on warfarin, coronary artery disease status post remote CABG, parkinsonism with suspected Lewy body dementia, chronic kidney disease stage III, and OSA on CPAP, now presenting to the emergency department for evaluation of general malaise, nausea, vertigo, and elevated blood pressure.  Patient is accompanied by his wife who assists with the history.  He is normally able to ambulate to the bathroom and about the house, but has been very unsteady on his feet and requiring intensive assistance on the day of presentation.  He complains of dizziness which he describes as a sensation that the room is spinning, worse when he tries to get up and better while at rest though still present.  He has had nausea without vomiting.  Denies headache.  He reported vision and hearing deficit prior to arrival in the ED, his wife reports that this is a frequent complaint of his, and patient denied this at time of admission.  He had also complained of transient left face and upper extremity tingling prior to arrival that also resolved.  ED Course: Upon arrival to the ED, patient is found to be afebrile, saturating mid 90s on room air, hypertensive to [id number] systolic.  EKG features sinus rhythm with incomplete RBBB and nonspecific repolarization abnormality.  Chest x-ray is negative for acute cardiopulmonary disease and noncontrast head CT is negative for acute intracranial abnormality.  Chemistry panel is notable for creatinine 1.28, similar to priors.  CBC is notable for mild stable normocytic anemia.  INR is 3.2.  Troponin is mildly elevated and unchanged after 2 hours.  There was concern for possible hypertensive  emergency and case was discussed with critical care who advised giving 20 mg IV hydralazine; blood pressure normalized with this and medical admission was recommended.  Case was also discussed with neurology who recommended CTA head and neck, MRI brain, and formal consultation as needed based on these imaging studies.  Review of Systems:  All other systems reviewed and apart from HPI, are negative.  Past Medical History:  Diagnosis Date  . AMD (age-related macular degeneration), bilateral   . Anemia   . Anginal pain ([place])    pts wife states pt had to use nitroglycerin this am 04/09/2016  . CAD (coronary artery disease)     NYHA Class 1B, CABG 1985  . CHF (congestive heart failure) ([place])   . Collagen vascular disease ([place])   . Collapsed lung    right   . Complication of anesthesia    pts wife states pt gets confused   . Degeneration of lumbar or lumbosacral intervertebral disc   . Depressive disorder, not elsewhere classified   . Dysrhythmia   . GERD (gastroesophageal reflux disease)   . History of blood transfusion   . History of hiatal hernia   . HTN (hypertension)   . Hypercholesteremia   . Lower leg edema    bilat  . Multiple falls   . Myocardial infarction ([place])   . Neuromuscular disorder (HCC)    neuropathy  . Neuropathy   . Non Hodgkin's lymphoma ([place])   . OSA on CPAP   . PE (pulmonary embolism) 03/2015  . Presence of permanent cardiac pacemaker   . Prostate cancer ([place])   .  Shortness of breath dyspnea    with exertion   . Sinoatrial node dysfunction (HCC)   . Ulcers of both lower legs (HCC)    history of   . Urinary incontinence     Past Surgical History:  Procedure Laterality Date  . APPENDECTOMY    . BACK SURGERY    . CARDIAC CATHETERIZATION N/A 08/27/2015   Procedure: Left Heart Cath and Cors/Grafts Angiography;  Surgeon: Belva Crome, MD;  Location: [facility];  Service: Cardiovascular;  Laterality: N/A;  . COLONOSCOPY N/A 08/26/2015   Procedure:  COLONOSCOPY;  Surgeon: Clarene Essex, MD;  Location: [facility] ENDOSCOPY;  Service: Endoscopy;  Laterality: N/A;  . CORONARY ANGIOPLASTY WITH STENT PLACEMENT     s/p bare metal stent implant SVG-diagonal 11/23/05, s/p BM stent implantation SVG diagonal 10/22/06 (feeds LAD), and 05/2010 with DES to left circumflex  . [place]   SVG to Diagonal/LAD,  seqSVG to OM1 and OM2  . CYSTOSCOPY W/ URETERAL STENT PLACEMENT Bilateral 04/10/2016   Procedure: CYSTOSCOPY WITH RETROGRADE PYELOGRAM/URETERAL STENT PLACEMENT;  Surgeon: Festus Aloe, MD;  Location: [facility];  Service: Urology;  Laterality: Bilateral;  . CYSTOSCOPY W/ URETERAL STENT PLACEMENT Bilateral 10/09/2016   Procedure: CYSTOSCOPY WITH BILATERAL RETROGRADE PYELOGRAM Edward Woodward  URETERAL STENT REMOVAL;  Surgeon: Festus Aloe, MD;  Location: [facility];  Service: Urology;  Laterality: Bilateral;  . ENDARTERECTOMY     bilat   . ESOPHAGOGASTRODUODENOSCOPY (EGD) WITH PROPOFOL N/A 08/26/2015   Procedure: ESOPHAGOGASTRODUODENOSCOPY (EGD) WITH PROPOFOL;  Surgeon: Clarene Essex, MD;  Location: [facility] ENDOSCOPY;  Service: Endoscopy;  Laterality: N/A;  . EXPLORATORY LAPAROTOMY    . LUMBAR FUSION    . PACEMAKER INSERTION    . PPM GENERATOR CHANGEOUT N/A 07/09/2017   Procedure: PPM GENERATOR CHANGEOUT;  Surgeon: Deboraha Sprang, MD;  Location: [place] [facility];  Service: Cardiovascular;  Laterality: N/A;  . ROTATOR CUFF REPAIR     bilat  . TONSILLECTOMY       reports that he quit smoking about 63 years ago. His smoking use included cigarettes. He has a 2.50 pack-year smoking history. He has never used smokeless tobacco. He reports that he does not drink alcohol or use drugs.  Allergies  Allergen Reactions  . Penicillins Hives, Shortness Of Breath and Other (See Comments)    Has patient had a PCN reaction causing immediate rash, facial/tongue/throat swelling, SOB or lightheadedness with hypotension: Yes Has patient had a PCN  reaction causing severe rash involving mucus membranes or skin necrosis: No Has patient had a PCN reaction that required hospitalization No Has patient had a PCN reaction occurring within the last 10 years: No If all of the above answers are "NO", then may proceed with Cephalosporin use.   . Simvastatin Other (See Comments)    Reaction:  Muscle weakness    Family History  Problem Relation Age of Onset  . Cancer Mother   . Heart disease Father 40  . Heart disease Brother      Prior to Admission medications   Medication Sig Start Date End Date Taking? Authorizing Provider  acetaminophen (TYLENOL) 500 MG tablet Take 1,000 mg by mouth every 6 (six) hours as needed for mild pain, moderate pain or headache.    Yes [provider]  carbidopa-levodopa (SINEMET IR) 25-100 MG tablet Take 1 tablet by mouth 3 (three) times daily. 03/14/19  Yes Patel, Donika K, DO  citalopram (CELEXA) 20 MG tablet Take  10 mg by mouth daily.    Yes [provider]  clonazePAM (KLONOPIN) 0.5 MG tablet Take 0.5 tablets (0.25 mg total) by mouth 2 (two) times daily. 01/30/19  Yes Patel, Donika K, DO  donepezil (ARICEPT) 10 MG tablet Take 5mg  (half-tab) at bedtime for 2 weeks, then increase 10mg  (1 tablet) at bedtime. Patient taking differently: Take 5 mg by mouth 2 (two) times daily.  03/14/19  Yes Patel, Donika K, DO  Melatonin 3 MG TABS Take 3 mg by mouth at bedtime.   Yes [provider]  metoprolol succinate (TOPROL-XL) 25 MG 24 hr tablet Take 1 tablet (25 mg total) by mouth daily. 03/20/19  Yes Belva Crome, MD  nitroGLYCERIN (NITROLINGUAL) 0.4 MG/SPRAY spray Place 1 spray under the tongue every 5 (five) minutes x 3 doses as needed for chest pain. 08/28/15  Yes Simmons, Brittainy M, PA-C  ondansetron (ZOFRAN) 4 MG tablet Take 1 tablet (4 mg total) by mouth every 8 (eight) hours as needed for nausea or vomiting. 06/17/16  Yes Ladell Pier, MD  Polyethyl Glycol-Propyl Glycol (SYSTANE) 0.4-0.3  % SOLN Place 1 drop into both eyes at bedtime.    Yes [provider]  polyethylene glycol (MIRALAX / GLYCOLAX) packet Take 17 g by mouth daily as needed for moderate constipation.    Yes [provider]  predniSONE (DELTASONE) 5 MG tablet Take 1 tablet (5 mg total) by mouth daily. 03/14/19  Yes Ladell Pier, MD  torsemide (DEMADEX) 20 MG tablet Take 1 tablet (20 mg total) by mouth as needed (for swelling (edema)). 03/20/19  Yes Martinique, Peter M, MD  vitamin B-12 (CYANOCOBALAMIN) 1000 MCG tablet Take 1,000 mcg by mouth at bedtime.    Yes [provider]  warfarin (COUMADIN) 5 MG tablet TAKE 1 TABLET DAILY AT 6 PM. TAKE 1 TABLET DAILY AT 6 PM ON M,W,F TAKE 1/2 TAB ALL OTHER DAYS or as directed Patient taking differently: Take 2.5-5 mg by mouth See admin instructions. Take one 5mg  tablet by mouth daily on M,W,F. Then take half a tablet by mouth rest of days. 03/14/19  Yes Ladell Pier, MD  ZYTIGA 250 MG tablet TAKE 4 TABLETS BY MOUTH ONCE DAILY AS DIRECTED.  TAKE 1 HOUR BEFORE OR 2 HOURS AFTER A MEAL Patient taking differently: Take 250 mg by mouth See admin instructions. Take 4 tablets by mouth twice daily as directed. Take 1 hour before or 2 hours after a meal 04/27/18  Yes Ladell Pier, MD  meclizine (ANTIVERT) 25 MG tablet Take 1 tablet (25 mg total) by mouth 3 (three) times daily as needed for dizziness or nausea. 03/23/19   Domenic Moras, PA-C    Physical Exam: Vitals:   03/23/19 2245 03/23/19 2300 03/23/19 2330 03/24/19 0145  BP: (!) 159/65 (!) 147/72 (!) 169/66 120/84  Pulse: 75 76  (!) 59  Resp: 17 (!) 22 14 (!) 24  Temp:      TempSrc:      SpO2: 98% 98%  97%    Constitutional: NAD, calm  Eyes: PERTLA, lids and conjunctivae normal ENMT: Mucous membranes are moist. Posterior pharynx clear of any exudate or lesions.   Neck: normal, supple, no masses, no thyromegaly Respiratory: no wheezing, no crackles. Normal respiratory effort. No accessory muscle use.   Cardiovascular: S1 & S2 heard, regular rate and rhythm. No extremity edema. Abdomen: No distension, no tenderness, soft. Bowel sounds active.  Musculoskeletal: no clubbing / cyanosis. No joint deformity upper and  lower extremities.    Skin: no significant rashes, lesions, ulcers. Warm, dry, well-perfused. Neurologic: No facial asymmetry, PERRL, EOMI. Gross hearing deficit. Sensation to light touch intact. Resting RUE tremor. Strength 5/5 in all 4 limbs.  Psychiatric: Alert and oriented to person, place, and situation. Pleasant, cooperative.    Labs on Admission: I have personally reviewed following labs and imaging studies  CBC: Recent Labs  Lab 03/23/19 1545  WBC 10.0  HGB 11.0*  HCT 33.8*  MCV 93.6  PLT [id number]   Basic Metabolic Panel: Recent Labs  Lab 03/23/19 1545  NA 135  K 4.4  CL 102  CO2 25  GLUCOSE 122*  BUN 17  CREATININE 1.28*  CALCIUM 9.5   GFR: CrCl cannot be calculated (Unknown ideal weight.). Liver Function Tests: Recent Labs  Lab 03/23/19 1545  AST 16  ALT 6  ALKPHOS 60  BILITOT 1.1  PROT 5.9*  ALBUMIN 3.3*   No results for input(s): LIPASE, AMYLASE in the last 168 hours. No results for input(s): AMMONIA in the last 168 hours. Coagulation Profile: Recent Labs  Lab 03/23/19 1545  INR 3.2*   Cardiac Enzymes: No results for input(s): CKTOTAL, CKMB, CKMBINDEX, TROPONINI in the last 168 hours. BNP (last 3 results) No results for input(s): PROBNP in the last 8760 hours. HbA1C: No results for input(s): HGBA1C in the last 72 hours. CBG: No results for input(s): GLUCAP in the last 168 hours. Lipid Profile: No results for input(s): CHOL, HDL, LDLCALC, TRIG, CHOLHDL, LDLDIRECT in the last 72 hours. Thyroid Function Tests: No results for input(s): TSH, T4TOTAL, FREET4, T3FREE, THYROIDAB in the last 72 hours. Anemia Panel: No results for input(s): VITAMINB12, FOLATE, FERRITIN, TIBC, IRON, RETICCTPCT in the last 72 hours. Urine analysis:     Component Value Date/Time   COLORURINE STRAW (A) 03/23/2019 1750   APPEARANCEUR CLEAR 03/23/2019 1750   LABSPEC 1.006 03/23/2019 1750   LABSPEC 1.020 05/21/2017 1557   PHURINE 7.0 03/23/2019 1750   GLUCOSEU NEGATIVE 03/23/2019 1750   GLUCOSEU Negative 05/21/2017 1557   HGBUR NEGATIVE 03/23/2019 1750   BILIRUBINUR NEGATIVE 03/23/2019 1750   BILIRUBINUR Negative 05/21/2017 1557   KETONESUR NEGATIVE 03/23/2019 1750   PROTEINUR NEGATIVE 03/23/2019 1750   UROBILINOGEN 0.2 05/21/2017 1557   NITRITE NEGATIVE 03/23/2019 1750   LEUKOCYTESUR NEGATIVE 03/23/2019 1750   LEUKOCYTESUR Negative 05/21/2017 1557   Sepsis Labs: @LABRCNTIP (procalcitonin:4,lacticidven:4) )No results found for this or any previous visit (from the past 240 hour(s)).   Radiological Exams on Admission: Ct Head Wo Contrast  Result Date: 03/23/2019 CLINICAL DATA:  Muscle weakness EXAM: CT HEAD WITHOUT CONTRAST TECHNIQUE: Contiguous axial images were obtained from the base of the skull through the vertex without intravenous contrast. COMPARISON:  None. FINDINGS: Brain: There is atrophy and chronic small vessel disease changes. No acute intracranial abnormality. Specifically, no hemorrhage, hydrocephalus, mass lesion, acute infarction, or significant intracranial injury. Vascular: No hyperdense vessel or unexpected calcification. Skull: No acute calvarial abnormality. Sinuses/Orbits: Mucosal thickening in the ethmoid air cells. No acute findings Other: None IMPRESSION: Atrophy, chronic microvascular disease. No acute intracranial abnormality. Electronically Signed   By: Rolm Baptise M.D.   On: 03/23/2019 19:01   Dg Chest Port 1 View  Result Date: 03/23/2019 CLINICAL DATA:  Hypertension EXAM: PORTABLE CHEST 1 VIEW COMPARISON:  05/23/2017 FINDINGS: Left pacer remains in place, unchanged. Prior CABG. Heart is normal size. Lungs clear. No effusions or acute bony abnormality. IMPRESSION: No active disease. Electronically Signed   By:  Rolm Baptise M.D.  On: 03/23/2019 16:29    EKG: Independently reviewed. Sinus rhythm, incomplete RBBB, non-specific repolarization abnormality.   Assessment/Plan   1. Vertigo  - Presents with sensation that room is spinning, unsteady gait, general malaise, and nausea without vomiting, found to be severely hypertensive with no acute findings on head CT  - There was concern for hypertensive crisis but sxs are unchanged despite BP normalizing  - Case was discussed with neurology who recommended CTA head and neck, MRI, and formal consultation as needed based on findings  - Check CTA H&N, MRI brain, and continue prn Antivert and supportive care   2. Hypertensive urgency  - BP was 200/100 range initially, with his acute neuro sxs there was concern for hypertensive emergency vs acute ischemic CVA  - Case was discussed with PCCM given concern that he would need to be managed with titratable IV antihypertenisve but hydralazine 20 mg IVP was recommended and BP quickly normalized  - There was no headache and sxs remain unchanged despite normalization of his BP, making hypertensive encephalopathy unlikely   - With concern for dropping BP too rapidly, as well as concern for acute ischemic CVA, will hold Toprol for now    3. CKD stage III  - SCr is 1.28 in ED, similar to priors  - Renally-dose medications    4. Prostate cancer  - Follows with urology and oncology, managed with Zytiga and prednisone 5 mg qD, will continue    5. Paroxysmal atrial fibrillation  - In sinus rhythm in ED  - CHADS-VASc is at least 57 (age x2, HTN)  - INR is 3.2, repeat in am, continue warfarin with pharmacy assistance - Continue metoprolol    6. Dementia  - Followed by neurology for Parkinsonism with suspected Lewy body dementia  - Continue Aricept, Sinemet    PPE: Mask, face shield  DVT prophylaxis: warfarin  Code Status: Full  Family Communication: Wife updated at bedside Consults called: ED physician discussed  with PCCM and neuro, not formally consulted  Admission status: Observation     Vianne Bulls, MD [facility] Pager [phone]  If 7PM-7AM, please contact night-coverage [email or web address] Password [email or web address]  03/24/2019, 2:08 AM

## 2019-03-24 NOTE — Evaluation (Signed)
Physical Therapy Evaluation Patient Details Name: Edward Woodward MRN: [id number] DOB: 1926/10/25 Today's Date: 03/24/2019   History of Present Illness  STEPEHN MCMANIGAL is a 85 y.o.M with PMH/o p-Afib  on warfarin, CAD, status post remote CABG, parkinsonism with suspected Lewy body dementia, CKD stage III, and OSA on CPAP, now presenting to ED for evaluation of dizziness, general malaise, nausea, vertigo, and elevated blood pressure.  CT and MRI Head negative for acute changes.  Clinical Impression  Patient presents with decreased mobility due to limited activity tolerance c/o feeling like passing out and nausea upon sitting up on EOB.  Would not tolerate another attempt to check orthostatics this session.  Did not note any nystagmus though pt rather "dizzy" upon coming up to sit and needing significant amount of help.  Feel he needs further work up to assist in determining if vestibular source of dizziness or if orthostatic, but pt too fatigued to participate more at this time.  Will continue acute skilled PT to progress hopefully to home if mobility improves, but may need to consider some form of post-acute inpatient rehab if not.     Follow Up Recommendations Supervision/Assistance - 24 hour;Home health PT;SNF    Equipment Recommendations  None recommended by PT    Recommendations for Other Services       Precautions / Restrictions Precautions Precautions: Fall      Mobility  Bed Mobility Overal bed mobility: Needs Assistance Bed Mobility: Supine to Sit;Sit to Supine     Supine to sit: Max assist;HOB elevated Sit to supine: Max assist   General bed mobility comments: assist to lift trunk and guide legs off bed to sit, assist for trunk and legs to supine; Did not notice nystagmus coming upright, though very symptomatic  Transfers                 General transfer comment: unable  Ambulation/Gait                Stairs            Wheelchair Mobility     Modified Rankin (Stroke Patients Only)       Balance Overall balance assessment: Needs assistance Sitting-balance support: Bilateral upper extremity supported Sitting balance-Leahy Scale: Poor Sitting balance - Comments: leaning back and initially mod A progressing to min A for sitting EOB; unable to tolerate longer than about 2-3 minutes due to felt "dizzy" as if passing out                                     Pertinent Vitals/Pain Pain Assessment: No/denies pain    Home Living Family/patient expects to be discharged to:: Private residence Living Arrangements: Spouse/significant other Available Help at Discharge: Family;Available 24 hours/day Type of Home: House Home Access: Ramped entrance     Home Layout: One level Home Equipment: Walker - 4 wheels;Wheelchair - [employer];Other (comment);Shower seat;Hand held shower head Additional Comments: lift chair    Prior Function Level of Independence: Needs assistance   Gait / Transfers Assistance Needed: ambulates to bathroom with rollator on his own,  uses transport chair for outings  ADL's / Homemaking Assistance Needed: wife assisted with into/out of shower only once a week, other days sponge bathes        Hand Dominance        Extremity/Trunk Assessment        Lower Extremity Assessment  Lower Extremity Assessment: Generalized weakness    Cervical / Trunk Assessment Cervical / Trunk Assessment: Kyphotic  Communication   Communication: HOH  Cognition Arousal/Alertness: Lethargic Behavior During Therapy: Flat affect Overall Cognitive Status: History of cognitive impairments - at baseline                                        General Comments General comments (skin integrity, edema, etc.): wife present and providing history, reports did not get much sleep due to symptoms and issues around admission    Exercises     Assessment/Plan    PT Assessment Patient  needs continued PT services  PT Problem List Decreased mobility;Decreased activity tolerance;Decreased balance;Other (comment)("dizziness")       PT Treatment Interventions DME instruction;Therapeutic activities;Balance training;Therapeutic exercise;Neuromuscular re-education;Patient/family education;Functional mobility training;Gait training;Other (comment)(vestibular rehab)    PT Goals (Current goals can be found in the Care Plan section)  Acute Rehab PT Goals Patient Stated Goal: to help dizziness PT Goal Formulation: With patient/family Time For Goal Achievement: 04/06/19 Potential to Achieve Goals: Good    Frequency Min 3X/week   Barriers to discharge        Co-evaluation               AM-PAC PT "6 Clicks" Mobility  Outcome Measure Help needed turning from your back to your side while in a flat bed without using bedrails?: A Lot Help needed moving from lying on your back to sitting on the side of a flat bed without using bedrails?: Total Help needed moving to and from a bed to a chair (including a wheelchair)?: Total Help needed standing up from a chair using your arms (e.g., wheelchair or bedside chair)?: Total Help needed to walk in hospital room?: Total Help needed climbing 3-5 steps with a railing? : Total 6 Click Score: 7    End of Session   Activity Tolerance: Patient limited by fatigue Patient left: in bed;with call bell/phone within reach;with family/visitor present   PT Visit Diagnosis: Dizziness and giddiness (R42);Other abnormalities of gait and mobility (R26.89)    Time: [id number] PT Time Calculation (min) (ACUTE ONLY): 31 min   Charges:   PT Evaluation $PT Eval Moderate Complexity: 1 Mod PT Treatments $Therapeutic Activity: 8-22 mins        Magda Kiel, [place] [facility] [phone] 03/24/2019   Reginia Naas 03/24/2019, 5:10 PM

## 2019-03-25 DIAGNOSIS — G4733 Obstructive sleep apnea (adult) (pediatric): Secondary | ICD-10-CM

## 2019-03-25 LAB — PROTIME-INR
INR: 2.2 — ABNORMAL HIGH (ref 0.8–1.2)
Prothrombin Time: 24.4 seconds — ABNORMAL HIGH (ref 11.4–15.2)

## 2019-03-25 LAB — VITAMIN B12: Vitamin B-12: 3306 pg/mL — ABNORMAL HIGH (ref 180–914)

## 2019-03-25 LAB — AMMONIA: Ammonia: 16 umol/L (ref 9–35)

## 2019-03-25 LAB — TSH: TSH: 1.489 u[IU]/mL (ref 0.350–4.500)

## 2019-03-25 MED ORDER — WARFARIN SODIUM 2.5 MG PO TABS
2.5000 mg | ORAL_TABLET | Freq: Once | ORAL | Status: AC
  Administered 2019-03-25: 2.5 mg via ORAL
  Filled 2019-03-25: qty 1

## 2019-03-25 MED ORDER — SODIUM CHLORIDE 0.9 % IV SOLN
INTRAVENOUS | Status: AC
  Administered 2019-03-25: 13:00:00 via INTRAVENOUS

## 2019-03-25 NOTE — Evaluation (Signed)
Occupational Therapy Evaluation Patient Details Name: Edward Woodward MRN: [id number] DOB: May 15, 1927 Today's Date: 03/25/2019    History of Present Illness Edward Woodward is a 85 y.o.M with PMH/o p-Afib  on warfarin, CAD, status post remote CABG, parkinsonism with suspected Lewy body dementia, CKD stage III, and OSA on CPAP, now presenting to ED for evaluation of dizziness, general malaise, nausea, vertigo, and elevated blood pressure.  CT and MRI Head negative for acute changes.   Clinical Impression   Pt with decline in function and safety with ADLs and ADL mobility with impaired strength, balance and endurance. Pt with hx of cognitive impairments and is very HOH. Pt reports feeling dizzy in supine and required mod - max A +2 for bed mobility to sit EOB with min A for balance/support. Pt leaning back and to right sitting EOB and requires max A for UB ADLs and total A for LB ADLs. Pt unable to stand to attempt transfers due to fatigue and dizziness. PTA, pt lived to home with his wife and required a RW for mobility and assist with bathing. Pt would benefit from acute OT services to address impairments to maximize level of function and safety BP Supine 135/50 Sitting 117/65 Standing unable    Follow Up Recommendations  SNF;Home health OT;Supervision/Assistance - 24 hour    Equipment Recommendations  None recommended by OT    Recommendations for Other Services       Precautions / Restrictions Precautions Precautions: Fall Restrictions Weight Bearing Restrictions: No      Mobility Bed Mobility Overal bed mobility: Needs Assistance Bed Mobility: Supine to Sit;Sit to Supine     Supine to sit: Mod assist;+2 for physical assistance Sit to supine: Max assist;+2 for physical assistance   General bed mobility comments: Mod assist +2 for supine to sit for trunk elevation, LE lifting and translation to EOB. Pt with good initiation of moving legs to EOB and reaching for handrails.  Max assist +2 for return to supine for LE lifting, trunk lowering, and scooting pt up in bed.  Transfers Overall transfer level: (NT - pt requires assist to maintain upright sitting)               General transfer comment: unable to attempt standing for [facility] transfer due to fatigue and c/o dizziness    Balance Overall balance assessment: Needs assistance Sitting-balance support: Bilateral upper extremity supported Sitting balance-Leahy Scale: Poor Sitting balance - Comments: posterior and R lateral leaning, requiring max assist to correct. Pt sat EOB ~10 minutes to assess smooth pursuits, address sitting balance, and test saccades. Postural control: Posterior lean;Right lateral lean                                 ADL either performed or assessed with clinical judgement   ADL Overall ADL's : Needs assistance/impaired Eating/Feeding: Sitting;Bed level;Minimal assistance   Grooming: Minimal assistance;Wash/dry face;Wash/dry hands Grooming Details (indicate cue type and reason): Poor sitting balance Upper Body Bathing: Maximal assistance   Lower Body Bathing: Total assistance   Upper Body Dressing : Maximal assistance   Lower Body Dressing: Total assistance     Toilet Transfer Details (indicate cue type and reason): unable to attempt due to fatigue Toileting- Clothing Manipulation and Hygiene: Total assistance;Bed level         General ADL Comments: unable to attempt standing for [facility] transfer due to fatigue and c/o dizziness  Vision Patient Visual Report: No change from baseline       Perception     Praxis      Pertinent Vitals/Pain Pain Assessment: No/denies pain     Hand Dominance Right   Extremity/Trunk Assessment Upper Extremity Assessment Upper Extremity Assessment: Generalized weakness   Lower Extremity Assessment Lower Extremity Assessment: Defer to PT evaluation   Cervical / Trunk Assessment Cervical / Trunk Assessment:  Kyphotic   Communication Communication Communication: HOH   Cognition Arousal/Alertness: Lethargic Behavior During Therapy: Flat affect Overall Cognitive Status: History of cognitive impairments - at baseline                                 General Comments: Difficulty following commands due to cognition vs very hearing impaired.   General Comments  Wife not present.    Exercises     Shoulder Instructions      Home Living Family/patient expects to be discharged to:: Private residence   Available Help at Discharge: Family;Available 24 hours/day Type of Home: House Home Access: Ramped entrance     Home Layout: One level     Bathroom Shower/Tub: Walk-in shower         Home Equipment: Environmental consultant - 4 wheels;Wheelchair - [employer];Other (comment);Shower seat;Hand held shower head   Additional Comments: lift chair      Prior Functioning/Environment Level of Independence: Needs assistance  Gait / Transfers Assistance Needed: ambulates to bathroom with rollator on his own,  uses transport chair for outings ADL's / Homemaking Assistance Needed: wife assisted with into/out of shower only once a week, other days sponge bathes and dresses himself            OT Problem List: Decreased strength;Impaired balance (sitting and/or standing);Decreased cognition;Decreased safety awareness;Decreased activity tolerance;Decreased coordination;Decreased knowledge of use of DME or AE      OT Treatment/Interventions: Self-care/ADL training;DME and/or AE instruction;Therapeutic activities;Balance training;Therapeutic exercise;Patient/family education    OT Goals(Current goals can be found in the care plan section) Acute Rehab OT Goals Patient Stated Goal: to help dizziness(Simultaneous filing. User may not have seen previous data.) OT Goal Formulation: With patient Time For Goal Achievement: 04/01/19 Potential to Achieve Goals: Good ADL Goals Pt Will Perform  Grooming: with min guard assist;with supervision;sitting Pt Will Perform Upper Body Bathing: with mod assist;sitting Pt Will Perform Lower Body Bathing: with max assist;with mod assist;sitting/lateral leans Pt Will Perform Upper Body Dressing: with mod assist;sitting Pt Will Transfer to Toilet: with max assist;with mod assist;with +2 assist;stand pivot transfer;bedside commode Additional ADL Goal #1: Pt will complete bed mobiliyt with max - mod A to sit EOB for ADLs  OT Frequency: Min 2X/week   Barriers to D/C:            Co-evaluation PT/OT/SLP Co-Evaluation/Treatment: Yes Reason for Co-Treatment: For patient/therapist safety;Necessary to address cognition/behavior during functional activity PT goals addressed during session: Mobility/safety with mobility OT goals addressed during session: ADL's and self-care      AM-PAC OT "6 Clicks" Daily Activity     Outcome Measure Help from another person eating meals?: A Little Help from another person taking care of personal grooming?: A Little Help from another person toileting, which includes using toliet, bedpan, or urinal?: Total Help from another person bathing (including washing, rinsing, drying)?: Total Help from another person to put on and taking off regular upper body clothing?: A Lot Help from another person to put on and  taking off regular lower body clothing?: Total 6 Click Score: 11   End of Session    Activity Tolerance: Patient limited by fatigue;Other (comment)(dizziness) Patient left: in bed;with call bell/phone within reach;with bed alarm set  OT Visit Diagnosis: Other abnormalities of gait and mobility (R26.89);Muscle weakness (generalized) (M62.81);History of falling (Z91.81);Other symptoms and signs involving cognitive function;Other symptoms and signs involving the nervous system (R29.898)                Time: [id number] OT Time Calculation (min): 29 min Charges:  OT General Charges $OT Visit: 1 Visit OT  Evaluation $OT Eval Moderate Complexity: 1 Mod    Britt Bottom 03/25/2019, 12:16 PM

## 2019-03-25 NOTE — Progress Notes (Signed)
Respiratory therapy came to assess need for a CPap but decided the patient didn't meet the criteria due to his dementia. No respiratory distress observed. Will continue to monitor for safety.

## 2019-03-25 NOTE — Progress Notes (Addendum)
Physical Therapy Treatment Patient Details Name: Edward Woodward MRN: [id number] DOB: 1927/02/21 Today's Date: 03/25/2019    History of Present Illness Edward Woodward is a 85 y.o.M with PMH/o p-Afib  on warfarin, CAD, status post remote CABG, parkinsonism with suspected Lewy body dementia, CKD stage III, and OSA on CPAP, now presenting to ED for evaluation of dizziness, general malaise, nausea, vertigo, and elevated blood pressure.  CT and MRI Head negative for acute changes.    PT Comments    Pt very tired upon arrival to room, states he did not sleep all night. Pt also states he was not treated well last night, states 3 people were in and out and mean to him. Unsure of the accuracy of this report.   Pt tolerated sitting EOB only this session, requiring mod-max assist +2 to perform. Pt was unable to progress to OOB mobility due to pt dizziness, which he described as both lightheadedness and as room spinning. Pt most symptomatic for dizziness, including what pt describes more like lightheadedness but at times states it feels like the room is spinning, with positional changes from supine to sit. Vitals listed below with 18 pt drop in systolic with supine>sit.  PT assessed pt's vestibular system in a limited capacity due to pt's difficulty with command following, fatigue, and decreased cervical ROM L>R:  Vestibular assessment:  - Smooth pursuits: R WFL, L with end gaze nystagmus and overcorrective saccades  - Saccades, horizontal and vertical: WFL - Dix hallpike R: + for torsional nystagmus, pt with mild reports of dizziness - Dix hallpike L: + torsional nystagmus, no reports of dizziness and limited by neck pain/ limited neck ROM  Orthostatic vitals: - supine: 135/50, 64 bpm - sitting: 117/65, 67 bpm   PT updated Edward/c plan to reflect SNF level of care, pt also would benefit from continued vestibular assessment and treatment. Pt with very limited tolerance for PT exam and interventions  today.   Follow Up Recommendations  Supervision/Assistance - 24 hour;SNF     Equipment Recommendations  None recommended by PT    Recommendations for Other Services       Precautions / Restrictions Precautions Precautions: Fall Restrictions Weight Bearing Restrictions: No    Mobility  Bed Mobility Overal bed mobility: Needs Assistance Bed Mobility: Supine to Sit;Sit to Supine     Supine to sit: Mod assist;+2 for physical assistance Sit to supine: Max assist;+2 for physical assistance   General bed mobility comments: Mod assist +2 for supine to sit for trunk elevation, LE lifting and translation to EOB. Pt with good initiation of moving legs to EOB and reaching for handrails. Max assist +2 for return to supine for LE lifting, trunk lowering, and scooting pt up in bed.  Transfers Overall transfer level: (NT - pt requires assist to maintain upright sitting)               General transfer comment: unable to attempt standing for [facility] transfer due to fatigue and c/o dizziness  Ambulation/Gait                 Stairs             Wheelchair Mobility    Modified Rankin (Stroke Patients Only)       Balance Overall balance assessment: Needs assistance Sitting-balance support: Bilateral upper extremity supported Sitting balance-Leahy Scale: Poor Sitting balance - Comments: posterior and R lateral leaning, requiring max assist to correct. Pt sat EOB ~10 minutes to assess smooth  pursuits, address sitting balance, and test saccades. Postural control: Posterior lean;Right lateral lean                                  Cognition Arousal/Alertness: Lethargic Behavior During Therapy: Flat affect Overall Cognitive Status: History of cognitive impairments - at baseline                                 General Comments: Difficulty following commands due to cognition vs very hearing impaired.      Exercises      General Comments  General comments (skin integrity, edema, etc.): Wife not present.      Pertinent Vitals/Pain Pain Assessment: No/denies pain    Home Living Family/patient expects to be discharged to:: Private residence   Available Help at Discharge: Family;Available 24 hours/day Type of Home: House Home Access: Ramped entrance   Home Layout: One level Home Equipment: Walker - 4 wheels;Wheelchair - [employer];Other (comment);Shower seat;Hand held shower head Additional Comments: lift chair    Prior Function Level of Independence: Needs assistance  Gait / Transfers Assistance Needed: ambulates to bathroom with rollator on his own,  uses transport chair for outings ADL's / Homemaking Assistance Needed: wife assisted with into/out of shower only once a week, other days sponge bathes and dresses himself     PT Goals (current goals can now be found in the care plan section) Acute Rehab PT Goals Patient Stated Goal: to help dizziness(Simultaneous filing. User may not have seen previous data.) PT Goal Formulation: With patient/family Time For Goal Achievement: 04/06/19 Potential to Achieve Goals: Good Progress towards PT goals: Progressing toward goals    Frequency    Min 2X/week      PT Plan Discharge plan needs to be updated;Frequency needs to be updated    Co-evaluation PT/OT/SLP Co-Evaluation/Treatment: Yes Reason for Co-Treatment: For patient/therapist safety;Necessary to address cognition/behavior during functional activity PT goals addressed during session: Mobility/safety with mobility OT goals addressed during session: ADL's and self-care      AM-PAC PT "6 Clicks" Mobility   Outcome Measure  Help needed turning from your back to your side while in a flat bed without using bedrails?: A Lot Help needed moving from lying on your back to sitting on the side of a flat bed without using bedrails?: Total Help needed moving to and from a bed to a chair (including a  wheelchair)?: Total Help needed standing up from a chair using your arms (e.g., wheelchair or bedside chair)?: Total Help needed to walk in hospital room?: Total Help needed climbing 3-5 steps with a railing? : Total 6 Click Score: 7    End of Session   Activity Tolerance: Patient limited by fatigue;Treatment limited secondary to medical complications (Comment)("dizziness") Patient left: in bed;with call bell/phone within reach;with family/visitor present Nurse Communication: Mobility status PT Visit Diagnosis: Dizziness and giddiness (R42);Other abnormalities of gait and mobility (R26.89)     Time: [id number] PT Time Calculation (min) (ACUTE ONLY): 26 min  Charges:  $Therapeutic Activity: 8-22 mins                    Edward Woodward, PT Acute Rehabilitation Services Pager [phone]  Office [phone]  Edward Woodward Edward Woodward 03/25/2019, 12:14 PM

## 2019-03-25 NOTE — Progress Notes (Signed)
ANTICOAGULATION CONSULT NOTE - Initial Consult  Pharmacy Consult for Coumadin Indication: atrial fibrillation  Allergies  Allergen Reactions  . Penicillins Hives, Shortness Of Breath and Other (See Comments)    Has patient had a PCN reaction causing immediate rash, facial/tongue/throat swelling, SOB or lightheadedness with hypotension: Yes Has patient had a PCN reaction causing severe rash involving mucus membranes or skin necrosis: No Has patient had a PCN reaction that required hospitalization No Has patient had a PCN reaction occurring within the last 10 years: No If all of the above answers are "NO", then may proceed with Cephalosporin use.   . Simvastatin Other (See Comments)    Reaction:  Muscle weakness    Patient Measurements: Height: 5\' 11"  (180.3 cm) Weight: 184 lb 15.5 oz (83.9 kg) IBW/kg (Calculated) : 75.3  Vital Signs: Temp: 98 F (36.7 C) (08/29 1340) Temp Source: Oral (08/29 1340) BP: 106/60 (08/29 1340) Pulse Rate: 66 (08/29 1340)  Labs: Recent Labs    03/23/19 1545 03/23/19 1745 03/24/19 0342 03/25/19 0814  HGB 11.0*  --   --   --   HCT 33.8*  --   --   --   PLT 168  --   --   --   LABPROT 32.1*  --  28.6* 24.4*  INR 3.2*  --  2.7* 2.2*  CREATININE 1.28*  --   --   --   TROPONINIHS 29* 29*  --   --     Estimated Creatinine Clearance: 41.7 mL/min (A) (by C-G formula based on SCr of 1.28 mg/dL (H)).    Assessment: 85 y.o. male admitted with vertigo, h/o Afib, to continue Coumadin. INR upon admission was 3.2. PTA warfarin regimen is 5 mg MWF, 2.5 mg all other days. INR is therapeutic today at 2.2. No overt bleeding noted. Will proceed using his PTA regimen for tonight.  Goal of Therapy:  INR 2-3 Monitor platelets by anticoagulation protocol: Yes   Plan:  Warfarin 2.5 mg po x 1 tonight Daily INR Monitor for bleeding  Richardine Service, PharmD PGY1 Pharmacy Resident Phone: [phone] 03/25/2019  2:02 PM  Please check [email or web address] for  unit-specific pharmacy phone numbers.

## 2019-03-25 NOTE — Progress Notes (Signed)
PROGRESS NOTE    [name]  [id number] DOB: [birth year] DOA: 03/23/2019 PCP: Lajean Manes, MD      Brief Narrative:  Edward Woodward is a 85 y.o. M with NHL off chemo, depression, CAD s/p CABG, Afib on warfarin, Parkinsonism with suspected Lewy body dementia, CKD III baseline 1.5 and OSA on CPAP, and sinus node dysfunction with pacer who presents with a constellation of vertigo, nausea, hypertension and ataxia.  In the ER, BP 200s/100s.  He was given antihypertensives and admitted to medicine.     Assessment & Plan:  Vertigo This appears to be improving. -Continue PT -Continue PRN Meclizine   Nausea, weakness, fatigue The patient seems to have had a subacute decline.  He is currently very debilitated, unable to stand safely. MRI brain normal.  No focal weakness or focal neurological deficits. -Check TSH, B12, ammonia, fluids -Obtain blood and urine culture -Obtain CXR  Hypertensive urgency BP high overnight but low this morning -Continue Labetalol  Prostate cancer -Continue Zytiga, prednisone  Paroxysmal atrial fibrillation -Continue beta-blocker -Continue warfarin  Pressure injury, sacrum, stage I, POA  Parkinson's disease Dementia -Continue Sinemet -Continue citalopram -Continue donepezil  Anemia Mild, stable    MDM and disposition: The below labs and imaging reports were reviewed and summarized above.  Medication management as above.  The patient was admitted with vertigo, but remains unable to stand and he remains confused and not at baseline from a neurological standpoint.  WIll work up with labs as above, culture data as above.  Disposition to be determined.       DVT prophylaxis: N.a on warfarin Code Status: FULL Family Communication: Wife by phone    Subjective: Very weak.  Still intermittent veritgo.  No vomiting.  No chest pain, dyspena.  No sputum, no fever. Still confused.    Objective: Vitals:   03/25/19 0021  03/25/19 0424 03/25/19 0445 03/25/19 1050  BP: (!) 177/58 (!) 171/61  (!) 125/58  Pulse: 73 64  64  Resp:  16    Temp:  98.1 F (36.7 C)  98.5 F (36.9 C)  TempSrc:  Oral  Oral  SpO2:  95%  95%  Weight:   83.9 kg   Height:        Intake/Output Summary (Last 24 hours) at 03/25/2019 1141 Last data filed at 03/25/2019 0916 Gross per 24 hour  Intake 100 ml  Output 300 ml  Net -200 ml   Filed Weights   03/24/19 0311 03/25/19 0445  Weight: 82 kg 83.9 kg    Examination: General appearance: eldelry adult male, alert and in no acute distress.   HEENT: Anicteric, conjunctiva pink, lids and lashes normal. No nasal deformity, discharge, epistaxis.  Lips moist, dentition normal, OP very dry, no oral lesions, hearing diminished.   Skin: Warm and dry.  no jaundice.  No suspicious rashes or lesions. Cardiac: RRR, nl S1-S2, sys murmur noted.  Capillary refill is brisk.  JVP normal.  No LE edema.  Radia pulses 2+ and symmetric. Respiratory: Normal respiratory rate and rhythm.  CTAB without rales or wheezes. Abdomen: Abdomen soft.  no TTP. No ascites, distension, hepatosplenomegaly.   MSK: No deformities or effusions. Neuro: Awake but very sleepy.  EOMI, barely able to lift arms, unable to lift legs against gravity.  Speech slow and halting.  Strength symmetric.    Psych: Sensorium intact and responding to questions, but not oriented to self, place.  Attention diminished.  Affect blunted.  Judgment seemed impaired.  Data Reviewed: I have personally reviewed following labs and imaging studies:  CBC: Recent Labs  Lab 03/23/19 1545  WBC 10.0  HGB 11.0*  HCT 33.8*  MCV 93.6  PLT [id number]   Basic Metabolic Panel: Recent Labs  Lab 03/23/19 1545  NA 135  K 4.4  CL 102  CO2 25  GLUCOSE 122*  BUN 17  CREATININE 1.28*  CALCIUM 9.5   GFR: Estimated Creatinine Clearance: 41.7 mL/min (A) (by C-G formula based on SCr of 1.28 mg/dL (H)). Liver Function Tests: Recent Labs  Lab  03/23/19 1545  AST 16  ALT 6  ALKPHOS 60  BILITOT 1.1  PROT 5.9*  ALBUMIN 3.3*   No results for input(s): LIPASE, AMYLASE in the last 168 hours. No results for input(s): AMMONIA in the last 168 hours. Coagulation Profile: Recent Labs  Lab 03/23/19 1545 03/24/19 0342  INR 3.2* 2.7*   Cardiac Enzymes: No results for input(s): CKTOTAL, CKMB, CKMBINDEX, TROPONINI in the last 168 hours. BNP (last 3 results) No results for input(s): PROBNP in the last 8760 hours. HbA1C: No results for input(s): HGBA1C in the last 72 hours. CBG: No results for input(s): GLUCAP in the last 168 hours. Lipid Profile: No results for input(s): CHOL, HDL, LDLCALC, TRIG, CHOLHDL, LDLDIRECT in the last 72 hours. Thyroid Function Tests: No results for input(s): TSH, T4TOTAL, FREET4, T3FREE, THYROIDAB in the last 72 hours. Anemia Panel: No results for input(s): VITAMINB12, FOLATE, FERRITIN, TIBC, IRON, RETICCTPCT in the last 72 hours. Urine analysis:    Component Value Date/Time   COLORURINE STRAW (A) 03/23/2019 1750   APPEARANCEUR CLEAR 03/23/2019 1750   LABSPEC 1.006 03/23/2019 1750   LABSPEC 1.020 05/21/2017 1557   PHURINE 7.0 03/23/2019 1750   GLUCOSEU NEGATIVE 03/23/2019 1750   GLUCOSEU Negative 05/21/2017 1557   HGBUR NEGATIVE 03/23/2019 1750   BILIRUBINUR NEGATIVE 03/23/2019 1750   BILIRUBINUR Negative 05/21/2017 1557   KETONESUR NEGATIVE 03/23/2019 1750   PROTEINUR NEGATIVE 03/23/2019 1750   UROBILINOGEN 0.2 05/21/2017 1557   NITRITE NEGATIVE 03/23/2019 1750   LEUKOCYTESUR NEGATIVE 03/23/2019 1750   LEUKOCYTESUR Negative 05/21/2017 1557   Sepsis Labs: @LABRCNTIP (procalcitonin:4,lacticacidven:4)  ) Recent Results (from the past 240 hour(s))  SARS CORONAVIRUS 2 (TAT 6-12 HRS) Nasal Swab Aptima Multi Swab     Status: None   Collection Time: 03/23/19  9:57 PM   Specimen: Aptima Multi Swab; Nasal Swab  Result Value Ref Range Status   SARS Coronavirus 2 NEGATIVE NEGATIVE Final     Comment: (NOTE) SARS-CoV-2 target nucleic acids are NOT DETECTED. The SARS-CoV-2 RNA is generally detectable in upper and lower respiratory specimens during the acute phase of infection. Negative results do not preclude SARS-CoV-2 infection, do not rule out co-infections with other pathogens, and should not be used as the sole basis for treatment or other patient management decisions. Negative results must be combined with clinical observations, patient history, and epidemiological information. The expected result is Negative. Fact Sheet for Patients: SugarRoll.be Fact Sheet for Healthcare Providers: https://www.woods-mathews.com/ This test is not yet approved or cleared by the [place] FDA and  has been authorized for detection and/or diagnosis of SARS-CoV-2 by FDA under an Emergency Use Authorization (EUA). This EUA will remain  in effect (meaning this test can be used) for the duration of the COVID-19 declaration under Section 56 4(b)(1) of the Act, 21 U.S.C. section 360bbb-3(b)(1), unless the authorization is terminated or revoked sooner. Performed at [facility], [place] [address], [place], [place] [address]  MRSA PCR Screening     Status: None   Collection Time: 03/24/19  5:50 AM   Specimen: Nasopharyngeal  Result Value Ref Range Status   MRSA by PCR NEGATIVE NEGATIVE Final    Comment:        The GeneXpert MRSA Assay (FDA approved for NASAL specimens only), is one component of a comprehensive MRSA colonization surveillance program. It is not intended to diagnose MRSA infection nor to guide or monitor treatment for MRSA infections. Performed at [facility], [place] [address] [place], [place] [address]          Radiology Studies: Ct Angio Head W Or Wo Contrast  Result Date: 03/24/2019 CLINICAL DATA:  Persistent central vertigo. Hypertension. EXAM: CT ANGIOGRAPHY HEAD AND NECK TECHNIQUE: Multidetector CT  imaging of the head and neck was performed using the standard protocol during bolus administration of intravenous contrast. Multiplanar CT image reconstructions and MIPs were obtained to evaluate the vascular anatomy. Carotid stenosis measurements (when applicable) are obtained utilizing NASCET criteria, using the distal internal carotid diameter as the denominator. CONTRAST:  135mL OMNIPAQUE IOHEXOL 350 MG/ML SOLN COMPARISON:  Head CT 03/23/2019 FINDINGS: CTA NECK FINDINGS SKELETON: There is no bony spinal canal stenosis. No lytic or blastic lesion. OTHER NECK: Normal pharynx, larynx and major salivary glands. No cervical lymphadenopathy. Unremarkable thyroid gland. UPPER CHEST: No pneumothorax or pleural effusion. No nodules or masses. AORTIC ARCH: There is moderate calcific atherosclerosis of the aortic arch. Ascending thoracic aorta measures up to 4.2 cm. Conventional 3 vessel aortic branching pattern. The visualized proximal subclavian arteries are widely patent. RIGHT CAROTID SYSTEM: --Common carotid artery: Widely patent origin without common carotid artery dissection or aneurysm. --Internal carotid artery: No dissection, occlusion or aneurysm. There is mixed density atherosclerosis extending into the proximal ICA, resulting in less than 50% stenosis. --External carotid artery: No acute abnormality. LEFT CAROTID SYSTEM: --Common carotid artery: Widely patent origin without common carotid artery dissection or aneurysm. --Internal carotid artery: No dissection, occlusion or aneurysm. Mild atherosclerotic calcification at the carotid bifurcation without hemodynamically significant stenosis. --External carotid artery: No acute abnormality. VERTEBRAL ARTERIES: Codominant configuration. Both origins are clearly patent. No dissection, occlusion or flow-limiting stenosis to the skull base (V1-V3 segments). CTA HEAD FINDINGS POSTERIOR CIRCULATION: --Vertebral arteries: Normal V4 segments. --Posterior inferior  cerebellar arteries (PICA): Patent origins from the vertebral arteries. --Anterior inferior cerebellar arteries (AICA): Patent origins from the basilar artery. --Basilar artery: Normal. --Superior cerebellar arteries: Normal. --Posterior cerebral arteries: Normal. Both originate from the basilar artery. Posterior communicating arteries (p-comm) are diminutive or absent. ANTERIOR CIRCULATION: --Intracranial internal carotid arteries: Atherosclerotic calcification of the internal carotid arteries at the skull base without hemodynamically significant stenosis. --Anterior cerebral arteries (ACA): Normal. Both A1 segments are present. Patent anterior communicating artery (a-comm). --Middle cerebral arteries (MCA): Normal. VENOUS SINUSES: As permitted by contrast timing, patent. ANATOMIC VARIANTS: None Review of the MIP images confirms the above findings. IMPRESSION: 1. No emergent large vessel occlusion or high-grade stenosis of the intracranial arteries. 2. Bilateral carotid bifurcation atherosclerosis without hemodynamically significant stenosis by NASCET criteria. 3. Aneurysmal dilatation of the ascending thoracic aorta measuring up to 4.2 cm. Recommend semi-annual imaging followup by CTA or MRA and referral to cardiothoracic surgery if not already obtained. This recommendation follows 2010 ACCF/AHA/AATS/ACR/ASA/SCA/SCAI/SIR/STS/SVM Guidelines for the Diagnosis and Management of Patients With Thoracic Aortic Disease. Circulation. 2010; [id number]:[id number]. Aortic aneurysm NOS (ICD10-I71.9) Aortic Atherosclerosis (ICD10-I70.0). Electronically Signed   By: Ulyses Jarred M.D.   On: 03/24/2019 02:21  Ct Head Wo Contrast  Result Date: 03/23/2019 CLINICAL DATA:  Muscle weakness EXAM: CT HEAD WITHOUT CONTRAST TECHNIQUE: Contiguous axial images were obtained from the base of the skull through the vertex without intravenous contrast. COMPARISON:  None. FINDINGS: Brain: There is atrophy and chronic small vessel disease changes.  No acute intracranial abnormality. Specifically, no hemorrhage, hydrocephalus, mass lesion, acute infarction, or significant intracranial injury. Vascular: No hyperdense vessel or unexpected calcification. Skull: No acute calvarial abnormality. Sinuses/Orbits: Mucosal thickening in the ethmoid air cells. No acute findings Other: None IMPRESSION: Atrophy, chronic microvascular disease. No acute intracranial abnormality. Electronically Signed   By: Rolm Baptise M.D.   On: 03/23/2019 19:01   Ct Angio Neck W And/or Wo Contrast  Result Date: 03/24/2019 CLINICAL DATA:  Persistent central vertigo. Hypertension. EXAM: CT ANGIOGRAPHY HEAD AND NECK TECHNIQUE: Multidetector CT imaging of the head and neck was performed using the standard protocol during bolus administration of intravenous contrast. Multiplanar CT image reconstructions and MIPs were obtained to evaluate the vascular anatomy. Carotid stenosis measurements (when applicable) are obtained utilizing NASCET criteria, using the distal internal carotid diameter as the denominator. CONTRAST:  111mL OMNIPAQUE IOHEXOL 350 MG/ML SOLN COMPARISON:  Head CT 03/23/2019 FINDINGS: CTA NECK FINDINGS SKELETON: There is no bony spinal canal stenosis. No lytic or blastic lesion. OTHER NECK: Normal pharynx, larynx and major salivary glands. No cervical lymphadenopathy. Unremarkable thyroid gland. UPPER CHEST: No pneumothorax or pleural effusion. No nodules or masses. AORTIC ARCH: There is moderate calcific atherosclerosis of the aortic arch. Ascending thoracic aorta measures up to 4.2 cm. Conventional 3 vessel aortic branching pattern. The visualized proximal subclavian arteries are widely patent. RIGHT CAROTID SYSTEM: --Common carotid artery: Widely patent origin without common carotid artery dissection or aneurysm. --Internal carotid artery: No dissection, occlusion or aneurysm. There is mixed density atherosclerosis extending into the proximal ICA, resulting in less than 50%  stenosis. --External carotid artery: No acute abnormality. LEFT CAROTID SYSTEM: --Common carotid artery: Widely patent origin without common carotid artery dissection or aneurysm. --Internal carotid artery: No dissection, occlusion or aneurysm. Mild atherosclerotic calcification at the carotid bifurcation without hemodynamically significant stenosis. --External carotid artery: No acute abnormality. VERTEBRAL ARTERIES: Codominant configuration. Both origins are clearly patent. No dissection, occlusion or flow-limiting stenosis to the skull base (V1-V3 segments). CTA HEAD FINDINGS POSTERIOR CIRCULATION: --Vertebral arteries: Normal V4 segments. --Posterior inferior cerebellar arteries (PICA): Patent origins from the vertebral arteries. --Anterior inferior cerebellar arteries (AICA): Patent origins from the basilar artery. --Basilar artery: Normal. --Superior cerebellar arteries: Normal. --Posterior cerebral arteries: Normal. Both originate from the basilar artery. Posterior communicating arteries (p-comm) are diminutive or absent. ANTERIOR CIRCULATION: --Intracranial internal carotid arteries: Atherosclerotic calcification of the internal carotid arteries at the skull base without hemodynamically significant stenosis. --Anterior cerebral arteries (ACA): Normal. Both A1 segments are present. Patent anterior communicating artery (a-comm). --Middle cerebral arteries (MCA): Normal. VENOUS SINUSES: As permitted by contrast timing, patent. ANATOMIC VARIANTS: None Review of the MIP images confirms the above findings. IMPRESSION: 1. No emergent large vessel occlusion or high-grade stenosis of the intracranial arteries. 2. Bilateral carotid bifurcation atherosclerosis without hemodynamically significant stenosis by NASCET criteria. 3. Aneurysmal dilatation of the ascending thoracic aorta measuring up to 4.2 cm. Recommend semi-annual imaging followup by CTA or MRA and referral to cardiothoracic surgery if not already obtained.  This recommendation follows 2010 ACCF/AHA/AATS/ACR/ASA/SCA/SCAI/SIR/STS/SVM Guidelines for the Diagnosis and Management of Patients With Thoracic Aortic Disease. Circulation. 2010; [id number]. Aortic aneurysm NOS (ICD10-I71.9) Aortic Atherosclerosis (ICD10-I70.0). Electronically Signed   By:  Ulyses Jarred M.D.   On: 03/24/2019 02:21   Mr Brain Wo Contrast  Result Date: 03/24/2019 CLINICAL DATA:  85 year old male with dizziness, unsteady gait, hypertensive. EXAM: MRI HEAD WITHOUT CONTRAST TECHNIQUE: Multiplanar, multiecho pulse sequences of the brain and surrounding structures were obtained without intravenous contrast. COMPARISON:  CTA head and neck earlier today. Head CT 03/23/2019 and earlier. FINDINGS: Brain: No restricted diffusion to suggest acute infarction. No midline shift, mass effect, evidence of mass lesion, ventriculomegaly, extra-axial collection or acute intracranial hemorrhage. Cervicomedullary junction and pituitary are within normal limits. Generalized cerebral volume loss. No disproportionate areas of cerebral atrophy identified. Patchy and confluent bilateral cerebral white matter T2 and FLAIR hyperintensity. No cortical encephalomalacia. There are possibly a few scattered chronic microhemorrhages in the brain (inferior right cerebellum series 8, image 4). Small chronic lacunar infarct in the left caudate on series 6, image 16. Mild T2 heterogeneity in the right pons. Small chronic right superior cerebellar infarcts suspected on series 9, image 30. Vascular: Major intracranial vascular flow voids are preserved. Skull and upper cervical spine: Negative visible cervical spine. Normal bone marrow signal. Sinuses/Orbits: Postoperative changes to both globes. Negative orbits. Mild paranasal sinus mucosal thickening. Other: Mastoids are clear. Visible internal auditory structures appear normal. Scalp and face soft tissues appear negative. IMPRESSION: 1.  No acute intracranial abnormality. 2.  Signal changes compatible with chronic small vessel disease, moderate for age. Electronically Signed   By: Genevie Ann M.D.   On: 03/24/2019 15:37   Dg Chest Port 1 View  Result Date: 03/23/2019 CLINICAL DATA:  Hypertension EXAM: PORTABLE CHEST 1 VIEW COMPARISON:  05/23/2017 FINDINGS: Left pacer remains in place, unchanged. Prior CABG. Heart is normal size. Lungs clear. No effusions or acute bony abnormality. IMPRESSION: No active disease. Electronically Signed   By: Rolm Baptise M.D.   On: 03/23/2019 16:29        Scheduled Meds:  abiraterone acetate  250 mg Oral See admin instructions   carbidopa-levodopa  1 tablet Oral TID WC   citalopram  10 mg Oral Daily   clonazePAM  0.25 mg Oral BID   donepezil  5 mg Oral BID   labetalol  100 mg Oral BID   Melatonin  3 mg Oral QHS   predniSONE  5 mg Oral Q breakfast   sodium chloride flush  3 mL Intravenous Q12H   sodium chloride flush  3 mL Intravenous Q12H   Warfarin - Pharmacist Dosing Inpatient   Does not apply q1800   Continuous Infusions:  sodium chloride     sodium chloride       LOS: 1 day    Time spent: 35 minutes    Edwin Dada, MD [facility] 03/25/2019, 11:41 AM     Please page through [place]:  [email or web address] Password [email or web address] If 7PM-7AM, please contact night-coverage

## 2019-03-26 ENCOUNTER — Inpatient Hospital Stay (HOSPITAL_COMMUNITY): Payer: Medicare Other

## 2019-03-26 DIAGNOSIS — G3183 Dementia with Lewy bodies: Principal | ICD-10-CM

## 2019-03-26 DIAGNOSIS — R531 Weakness: Secondary | ICD-10-CM

## 2019-03-26 DIAGNOSIS — F0280 Dementia in other diseases classified elsewhere without behavioral disturbance: Secondary | ICD-10-CM

## 2019-03-26 DIAGNOSIS — H814 Vertigo of central origin: Secondary | ICD-10-CM

## 2019-03-26 DIAGNOSIS — Z9989 Dependence on other enabling machines and devices: Secondary | ICD-10-CM

## 2019-03-26 DIAGNOSIS — R42 Dizziness and giddiness: Secondary | ICD-10-CM

## 2019-03-26 DIAGNOSIS — R791 Abnormal coagulation profile: Secondary | ICD-10-CM

## 2019-03-26 LAB — CBC
HCT: 29.8 % — ABNORMAL LOW (ref 39.0–52.0)
Hemoglobin: 9.5 g/dL — ABNORMAL LOW (ref 13.0–17.0)
MCH: 30.2 pg (ref 26.0–34.0)
MCHC: 31.9 g/dL (ref 30.0–36.0)
MCV: 94.6 fL (ref 80.0–100.0)
Platelets: 162 10*3/uL (ref 150–400)
RBC: 3.15 MIL/uL — ABNORMAL LOW (ref 4.22–5.81)
RDW: 14.3 % (ref 11.5–15.5)
WBC: 8.2 10*3/uL (ref 4.0–10.5)
nRBC: 0 % (ref 0.0–0.2)

## 2019-03-26 LAB — CK: Total CK: 57 U/L (ref 49–397)

## 2019-03-26 LAB — COMPREHENSIVE METABOLIC PANEL
ALT: 6 U/L (ref 0–44)
AST: 12 U/L — ABNORMAL LOW (ref 15–41)
Albumin: 2.8 g/dL — ABNORMAL LOW (ref 3.5–5.0)
Alkaline Phosphatase: 48 U/L (ref 38–126)
Anion gap: 9 (ref 5–15)
BUN: 26 mg/dL — ABNORMAL HIGH (ref 8–23)
CO2: 24 mmol/L (ref 22–32)
Calcium: 9.1 mg/dL (ref 8.9–10.3)
Chloride: 105 mmol/L (ref 98–111)
Creatinine, Ser: 1.35 mg/dL — ABNORMAL HIGH (ref 0.61–1.24)
GFR calc Af Amer: 54 mL/min — ABNORMAL LOW (ref >60)
GFR calc non Af Amer: 46 mL/min — ABNORMAL LOW (ref >60)
Glucose, Bld: 102 mg/dL — ABNORMAL HIGH (ref 70–99)
Potassium: 3.8 mmol/L (ref 3.5–5.1)
Sodium: 138 mmol/L (ref 135–145)
Total Bilirubin: 0.6 mg/dL (ref 0.3–1.2)
Total Protein: 4.8 g/dL — ABNORMAL LOW (ref 6.5–8.1)

## 2019-03-26 LAB — PROTIME-INR
INR: 2.4 — ABNORMAL HIGH (ref 0.8–1.2)
Prothrombin Time: 25.9 seconds — ABNORMAL HIGH (ref 11.4–15.2)

## 2019-03-26 LAB — PSA: Prostatic Specific Antigen: 32.3 ng/mL — ABNORMAL HIGH (ref 0.00–4.00)

## 2019-03-26 MED ORDER — WARFARIN SODIUM 2.5 MG PO TABS
2.5000 mg | ORAL_TABLET | Freq: Once | ORAL | Status: AC
  Administered 2019-03-26: 2.5 mg via ORAL
  Filled 2019-03-26: qty 1

## 2019-03-26 MED ORDER — SENNOSIDES-DOCUSATE SODIUM 8.6-50 MG PO TABS
1.0000 | ORAL_TABLET | Freq: Two times a day (BID) | ORAL | Status: DC | PRN
  Administered 2019-03-26: 14:00:00 1 via ORAL
  Filled 2019-03-26: qty 1

## 2019-03-26 NOTE — Consult Note (Addendum)
NEURO HOSPITALIST CONSULT NOTE   Requestig physician: Dr. Loleta Books  Reason for Consult: Worsening Mental status  History obtained from:  Wife/patient  HPI:                                                                                                                                          Edward Woodward is an 85 y.o. male  With PMH CAD s/p CABG, NHL off chemo, a fib on warfarin, CKD 3 and OSA on CPAP who presented with dizziness ( light headed), nausea and hypertension. Neurology consulted for suspected parkinsonism and worsening mental status.   History is obtained mostly from wife d/t patients mental status.  Wife states that patient is normally able to ambulate around the house with his rollator. Lately he has been having more difficulty walking. His gait has become progressively more shuffling. He has been c/o dizziness which was described as feeling light headed. Per a previous note the dizziness was described as the room spinning but per wife he said he felt he was about to faint. Patient has had problems with his memory for 1-2 years. It first started 1-2 years ago when he was unable to identify his wife intermittently. She reports that this happens more at night and sometimes he is agitated. She also reports hallucinations at times. He thrashes around in his sleep as though acting out dreams (which she states has been worse since being on sinemet). He has been incontinent of urine for several years. Endorses problems chewing/eating and swallowing. Sometimes exhibits spatial disorientation.   Hospital course:  EKG: SR with incomplete RBBB and nonspecific repolarization INR: 3.2 CTA: No LVO; bilateral carotid atherosclerosis w/o stenosis. Aneurysmal dilation of ascending thoracic aorta up to 4.2 cm MRI: no acute abnormality; chronic small vessel disease.  Past Medical History:  Diagnosis Date  . AMD (age-related macular degeneration), bilateral   . Anemia   .  Anginal pain ([place])    pts wife states pt had to use nitroglycerin this am 04/09/2016  . CAD (coronary artery disease)     NYHA Class 1B, CABG 1985  . CHF (congestive heart failure) ([place])   . Collagen vascular disease ([place])   . Collapsed lung    right   . Complication of anesthesia    pts wife states pt gets confused   . Degeneration of lumbar or lumbosacral intervertebral disc   . Depressive disorder, not elsewhere classified   . Dysrhythmia   . GERD (gastroesophageal reflux disease)   . History of blood transfusion   . History of hiatal hernia   . HTN (hypertension)   . Hypercholesteremia   . Lower leg edema    bilat  . Multiple falls   . Myocardial infarction ([place])   . Neuromuscular disorder (  HCC)    neuropathy  . Neuropathy   . Non Hodgkin's lymphoma ([place])   . OSA on CPAP   . PE (pulmonary embolism) 03/2015  . Presence of permanent cardiac pacemaker   . Prostate cancer ([place])   . Shortness of breath dyspnea    with exertion   . Sinoatrial node dysfunction (HCC)   . Ulcers of both lower legs (HCC)    history of   . Urinary incontinence     Past Surgical History:  Procedure Laterality Date  . APPENDECTOMY    . BACK SURGERY    . CARDIAC CATHETERIZATION N/A 08/27/2015   Procedure: Left Heart Cath and Cors/Grafts Angiography;  Surgeon: Belva Crome, MD;  Location: [facility];  Service: Cardiovascular;  Laterality: N/A;  . COLONOSCOPY N/A 08/26/2015   Procedure: COLONOSCOPY;  Surgeon: Clarene Essex, MD;  Location: [facility] ENDOSCOPY;  Service: Endoscopy;  Laterality: N/A;  . CORONARY ANGIOPLASTY WITH STENT PLACEMENT     s/p bare metal stent implant SVG-diagonal 11/23/05, s/p BM stent implantation SVG diagonal 10/22/06 (feeds LAD), and 05/2010 with DES to left circumflex  . [place]   SVG to Diagonal/LAD,  seqSVG to OM1 and OM2  . CYSTOSCOPY W/ URETERAL STENT PLACEMENT Bilateral 04/10/2016   Procedure: CYSTOSCOPY WITH RETROGRADE  PYELOGRAM/URETERAL STENT PLACEMENT;  Surgeon: Festus Aloe, MD;  Location: [facility];  Service: Urology;  Laterality: Bilateral;  . CYSTOSCOPY W/ URETERAL STENT PLACEMENT Bilateral 10/09/2016   Procedure: CYSTOSCOPY WITH BILATERAL RETROGRADE PYELOGRAM [name]  URETERAL STENT REMOVAL;  Surgeon: Festus Aloe, MD;  Location: [facility];  Service: Urology;  Laterality: Bilateral;  . ENDARTERECTOMY     bilat   . ESOPHAGOGASTRODUODENOSCOPY (EGD) WITH PROPOFOL N/A 08/26/2015   Procedure: ESOPHAGOGASTRODUODENOSCOPY (EGD) WITH PROPOFOL;  Surgeon: Clarene Essex, MD;  Location: [facility] ENDOSCOPY;  Service: Endoscopy;  Laterality: N/A;  . EXPLORATORY LAPAROTOMY    . LUMBAR FUSION    . PACEMAKER INSERTION    . PPM GENERATOR CHANGEOUT N/A 07/09/2017   Procedure: PPM GENERATOR CHANGEOUT;  Surgeon: Deboraha Sprang, MD;  Location: [facility];  Service: Cardiovascular;  Laterality: N/A;  . ROTATOR CUFF REPAIR     bilat  . TONSILLECTOMY      Family History  Problem Relation Age of Onset  . Cancer Mother   . Heart disease Father 31  . Heart disease Brother          Social History:  reports that he quit smoking about 63 years ago. His smoking use included cigarettes. He has a 2.50 pack-year smoking history. He has never used smokeless tobacco. He reports that he does not drink alcohol or use drugs.  Allergies  Allergen Reactions  . Penicillins Hives, Shortness Of Breath and Other (See Comments)    Has patient had a PCN reaction causing immediate rash, facial/tongue/throat swelling, SOB or lightheadedness with hypotension: Yes Has patient had a PCN reaction causing severe rash involving mucus membranes or skin necrosis: No Has patient had a PCN reaction that required hospitalization No Has patient had a PCN reaction occurring within the last 10 years: No If all of the above answers are "NO", then may proceed with Cephalosporin use.   . Simvastatin Other (See Comments)    Reaction:  Muscle weakness     MEDICATIONS:  Scheduled: . abiraterone acetate  250 mg Oral See admin instructions  . carbidopa-levodopa  1 tablet Oral TID WC  . citalopram  10 mg Oral Daily  . clonazePAM  0.25 mg Oral BID  . donepezil  5 mg Oral BID  . labetalol  100 mg Oral BID  . Melatonin  3 mg Oral QHS  . predniSONE  5 mg Oral Q breakfast  . sodium chloride flush  3 mL Intravenous Q12H  . sodium chloride flush  3 mL Intravenous Q12H  . warfarin  2.5 mg Oral ONCE-1800  . Warfarin - Pharmacist Dosing Inpatient   Does not apply q1800   Continuous: . sodium chloride     FN:[id number] chloride, acetaminophen **OR** acetaminophen, HYDROcodone-acetaminophen, meclizine, ondansetron **OR** ondansetron (ZOFRAN) IV, polyethylene glycol, senna-docusate, sodium chloride flush   ROS:                                                                                                                                       History obtained from wife     Blood pressure (!) 129/41, pulse 65, temperature 98.2 F (36.8 C), temperature source Oral, resp. rate 18, height 5\' 11"  (1.803 m), weight 83.7 kg, SpO2 95 %.   General Examination:                                                                                                       Physical Exam  HEENT-  Normocephalic, no lesions, without obvious abnormality.  Normal external eye and conjunctiva.   Cardiovascular- S1-S2 audible, pulses palpable throughout   Lungs-no rhonchi or wheezing noted, no excessive working breathing.  Saturations within normal limits Abdomen- All 4 quadrants palpated and nontender Extremities- Warm, dry and intact Musculoskeletal-no joint tenderness, deformity or swelling Skin-warm and dry, no hyperpigmentation, vitiligo, or suspicious lesions  Neurological Examination Mental Status: Alert, oriented,name/state/month/city. Naming  intact thought content appropriate.  Speech fluent without evidence of aphasia.  Some difficulty with 3 step commands. Cranial Nerves: II: Visual fields grossly normal,  III,IV, VI: ptosis not present, saccadic pursuits with EOM, pupils equal, round, reactive to light and accommodation V,VII: smile symmetric, facial light touch sensation normal bilaterally VIII: hearing normal bilaterally IX,X: uvula rises symmetrically XI: bilateral shoulder shrug XII: midline tongue extension Motor: 4/5 in all extremities.  Cogwheel rigidity noted in BUE worse on the left.  Action tremor noted. Sensory: Cool temp sensation decreased below the knees Deep Tendon Reflexes:1+ BUE. Knee and ankle jerk not able to  elicit bilaterally Plantars: Mute bilaterally Cerebellar: FNF not ataxic; patient does have an action tremor Gait: deferred   Lab Results: Basic Metabolic Panel: Recent Labs  Lab 03/23/19 1545 03/26/19 0410  NA 135 138  K 4.4 3.8  CL 102 105  CO2 25 24  GLUCOSE 122* 102*  BUN 17 26*  CREATININE 1.28* 1.35*  CALCIUM 9.5 9.1    CBC: Recent Labs  Lab 03/23/19 1545 03/26/19 0410  WBC 10.0 8.2  HGB 11.0* 9.5*  HCT 33.8* 29.8*  MCV 93.6 94.6  PLT 168 162    Cardiac Enzymes: Recent Labs  Lab 03/26/19 1141  CKTOTAL 57    Lipid Panel: No results for input(s): CHOL, TRIG, HDL, CHOLHDL, VLDL, LDLCALC in the last 168 hours.  Imaging: Dg Chest 2 View  Result Date: 03/26/2019 CLINICAL DATA:  Weakness. EXAM: CHEST - 2 VIEW COMPARISON:  Chest x-ray dated March 23, 2019. FINDINGS: Unchanged left chest wall pacemaker. Stable cardiomediastinal silhouette status post CABG. Atherosclerotic calcification of the aortic arch. Normal pulmonary vascularity. No focal consolidation, pleural effusion, or pneumothorax. No acute osseous abnormality. IMPRESSION: No active cardiopulmonary disease. Electronically Signed   By: Titus Dubin M.D.   On: 03/26/2019 09:37   Mr Brain Wo  Contrast  Result Date: 03/24/2019 CLINICAL DATA:  85 year old male with dizziness, unsteady gait, hypertensive. EXAM: MRI HEAD WITHOUT CONTRAST TECHNIQUE: Multiplanar, multiecho pulse sequences of the brain and surrounding structures were obtained without intravenous contrast. COMPARISON:  CTA head and neck earlier today. Head CT 03/23/2019 and earlier. FINDINGS: Brain: No restricted diffusion to suggest acute infarction. No midline shift, mass effect, evidence of mass lesion, ventriculomegaly, extra-axial collection or acute intracranial hemorrhage. Cervicomedullary junction and pituitary are within normal limits. Generalized cerebral volume loss. No disproportionate areas of cerebral atrophy identified. Patchy and confluent bilateral cerebral white matter T2 and FLAIR hyperintensity. No cortical encephalomalacia. There are possibly a few scattered chronic microhemorrhages in the brain (inferior right cerebellum series 8, image 4). Small chronic lacunar infarct in the left caudate on series 6, image 16. Mild T2 heterogeneity in the right pons. Small chronic right superior cerebellar infarcts suspected on series 9, image 30. Vascular: Major intracranial vascular flow voids are preserved. Skull and upper cervical spine: Negative visible cervical spine. Normal bone marrow signal. Sinuses/Orbits: Postoperative changes to both globes. Negative orbits. Mild paranasal sinus mucosal thickening. Other: Mastoids are clear. Visible internal auditory structures appear normal. Scalp and face soft tissues appear negative. IMPRESSION: 1.  No acute intracranial abnormality. 2. Signal changes compatible with chronic small vessel disease, moderate for age. Electronically Signed   By: Genevie Ann M.D.   On: 03/24/2019 15:37    Assessment:  Edward Woodward is an 85 y.o. male  With PMH CAD s/p CABG, NHL off chemo, a fib on warfarin, CKD 3 and OSA on CPAP who presented with dizziness ( light headed), nausea and hypertension.  Neurology consulted for suspected parkinsonism and worsening mental status.  1. CTH: no hemorrhage  2. CTA: negative for LVO.  3. MRI: negative for stroke.  4. History of symptoms in conjunction with exam are felt to be most consistent with Lewy body dementia (LBD). Worsening mentation this visit most likely due to a cognitive fluctuation, which is characteristic of LBD.  5. Sinemet has not helped the patient symptomatically and may exacerbate hallucinations - in fact these have become worse since starting Sinemet about one month ago. Recommend tapering off Sinemet over a 2 week period. Call Pharmacy for assistance  in formulating a tapering schedule.   Recommendations: -  Recommend tapering off Sinemet over a 2 week period. Call Pharmacy for assistance in formulating a tapering schedule. -- Outpatient follow up with Neurology    Laurey Morale, MSN, NP-C [facility] [phone]  I have seen and examined the patient. My examination findings were observed and documented by Laurey Morale, NP. I have formulated the assessment and plan.  Electronically signed: Dr. Kerney Elbe 03/26/2019, 1:33 PM

## 2019-03-26 NOTE — TOC Initial Note (Signed)
Transition of Care [facility]) - Initial/Assessment Note    Patient Details  Name: Edward Woodward MRN: [id number] Date of Birth: 27-Feb-1927  Transition of Care [facility]) CM/SW Contact:    Bary Castilla, LCSW Phone Number: [phone] 03/26/2019, 3:51 PM  Clinical Narrative:                  CSW met with patient and patient's wife to acknowledge SNF consult and review the recommendation from OT. Due to patient's cognition and being lethargic CSW spoke with wife. Patient's wife informed CSW that she did not feel comfortable with sending patient to a facility due to [place] and would like him to return home.   CSW inquired about supports at home and she stated that she could care for him and would like HH.  CSW informed patient's wife that further follow up around [facility] would occur.    Expected Discharge Plan: [place] Barriers to Discharge: Continued Medical Work up   Patient Goals and CMS Choice        Expected Discharge Plan and Services Expected Discharge Plan: [place]       Living arrangements for the past 2 months: Single Family Home Expected Discharge Date: 03/27/19                                    Prior Living Arrangements/Services Living arrangements for the past 2 months: Single Family Home Lives with:: Self                   Activities of Daily Living Home Assistive Devices/Equipment: Environmental consultant (specify type) ADL Screening (condition at time of admission) Patient's cognitive ability adequate to safely complete daily activities?: Yes Is the patient deaf or have difficulty hearing?: Yes Does the patient have difficulty seeing, even when wearing glasses/contacts?: No Does the patient have difficulty concentrating, remembering, or making decisions?: Yes Patient able to express need for assistance with ADLs?: Yes Does the patient have difficulty dressing or bathing?: Yes Independently performs ADLs?: No Communication:  Appropriate for developmental age Dressing (OT): Needs assistance Is this a change from baseline?: Pre-admission baseline Grooming: Needs assistance Is this a change from baseline?: Pre-admission baseline Feeding: Appropriate for developmental age Bathing: Needs assistance Is this a change from baseline?: Pre-admission baseline Toileting: Appropriate for developmental age In/Out Bed: Needs assistance Is this a change from baseline?: Pre-admission baseline Walks in Home: Needs assistance Is this a change from baseline?: Pre-admission baseline Does the patient have difficulty walking or climbing stairs?: Yes Weakness of Legs: Both Weakness of Arms/Hands: Both  Permission Sought/Granted                  Emotional Assessment Appearance:: Appears stated age Attitude/Demeanor/Rapport: Lethargic Affect (typically observed): Appropriate Orientation: : Oriented to Self, Oriented to Place      Admission diagnosis:  Dizziness [R42] Hypertensive emergency [I16.1] Patient Active Problem List   Diagnosis Date Noted  . Vertigo 03/24/2019  . Persistent vertigo of central origin 03/24/2019  . Hypertensive emergency   . Advanced atrophic nonexudative age-related macular degeneration of both eyes with subfoveal involvement 02/25/2017  . Dry eyes, bilateral 02/25/2017  . Epiretinal membrane (ERM) of right eye 02/25/2017  . Scleral degenerative disorder 02/25/2017  . Goals of care, counseling/discussion 09/17/2016  . Hematuria 07/16/2016  . Protein-calorie malnutrition, severe 06/15/2016  . Hypercalcemia of malignancy 06/11/2016  .  Hypercalcemia 06/09/2016  . Sacral decubitus ulcer, stage III ([place]) 06/09/2016  . 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 Gait instability 03/06/2016  . Palliative care encounter   . Weakness generalized   . Supratherapeutic INR 01/13/2016  . Encephalopathy acute 01/13/2016  . Falls 01/12/2016  . Failure to thrive in adult 01/12/2016  . Retroperitoneal mass 01/12/2016   . Hydronephrosis 01/12/2016  . AKI (acute kidney injury) ([place]) 01/12/2016  . Weakness 01/12/2016  . Dehydration 12/19/2015  . Long term current use of anticoagulant therapy 12/19/2015  . Peripheral edema 12/19/2015  . Rectal bleeding 12/19/2015  . Prostate cancer ([place]) 12/19/2015  . Pain in the chest   . NSTEMI (non-ST elevated myocardial infarction) ([place])   . Iron deficiency anemia due to chronic blood loss   . Chest pain 08/23/2015  . Encounter for chemotherapy management 05/06/2015  . Pulmonary embolus ([place]) 04/01/2015  . CKD (chronic kidney disease), stage III ([place]) 04/01/2015  . Chronic anemia 04/01/2015  . Acute respiratory failure with hypoxia ([place]) 04/01/2015  . Urge incontinence of urine 04/30/2014  . Sinus node dysfunction chronotropic incompetence 08/23/2013  . Cardiac pacemaker -[employer] 08/23/2013  . Paroxysmal atrial fibrillation ([place]) 08/23/2013  . Dyspnea 07/10/2013  . OSA on CPAP 07/10/2013  . Peripheral vascular disease ([place]) 06/07/2013  . Hyperlipidemia 06/07/2013  . Essential hypertension, benign 06/07/2013  . Male hypogonadism 07/25/2012  . Balanitis 04/25/2012  . Elevated prostate specific antigen (PSA) 04/25/2012  . Urinary urgency 04/25/2012  . Nodular lymphoma of extranodal and/or solid organ site [facility]) 07/03/2011   PCP:  Lajean Manes, MD Pharmacy:   [facility] - [place], [place] - [address] [place] [address] Phone: [phone] Fax: [phone]     Social Determinants of Health ([place]) Interventions    Readmission Risk Interventions No flowsheet data found.

## 2019-03-26 NOTE — Progress Notes (Signed)
PROGRESS NOTE    Edward Woodward  [id number] DOB: June 04, 1927 DOA: 03/23/2019 PCP: Lajean Manes, MD      Brief Narrative:  Edward Woodward is a 85 y.o. M with NHL off chemo, depression, CAD s/p CABG, Afib on warfarin, Parkinsonism with suspected Lewy body dementia, CKD III baseline 1.5 and OSA on CPAP, and sinus node dysfunction with pacer who presents with a constellation of vertigo, nausea, hypertension and ataxia.  In the ER, BP 200s/100s.  He was given antihypertensives and admitted to medicine.       Assessment & Plan:  Nausea, weakness, fatigue It sounds as if the patient has had some chronic decline in the context of his prostate cancer and Lewy body dementia, but that the current episode started pretty abruptly int he wife's opinion:  Day of admission, he complained to her that "something's wrong in my head" and he couldn't see or hear properly.  She shrugged it off as dementia at first, but then he told her later he couldn't walk, so she checked his BP and it was extremely high, and so she called EMS.    Here, the MRI brain was unremarkable.    He was treated with meclizine for vertigo (see below) but over last 48 hours seems to be weaker and more debilitated and sleepier/more encephalopathic.    TSH, ammonia, and B12 were normal.  He did not improve with fluids.  His BP has been controlled mostly for the last 48 hours without improvement. No focal signs of infection.  CXR personally reviewed, shows no pneumonia. -Continue IV fluids -Check CK -Check PSA -Consult Neurology, appreciate expert cares  Hypertensive urgency BP normal during the day -Continue Labetalol  Prostate cancer There has been a gradual PSA increase to 26 in the last few months. -Check PSA -Continue Zytiga, prednisone  Paroxysmal atrial fibrillation Rate controlled -Continue beta-blocker, warfarin   Pressure injury, sacrum, stage I, POA  Parkinsonism Dementia Started on Sinemet 6-8 weeks  ago by Dr. Posey Pronto for symptomatic relief of [place] symptoms.  Wife states she has seen no improvement in symptoms.   -Continue Sinemet -Continue citalopram and donepezil   Anemia Mild, stable  Vertigo This was a prominent initial complaint, and appears to be improving.  Never has had vertigo before, wife confirms. -Continue PRN Meclizine     MDM and disposition: The below labs and imaging reports reviewed and summarized above.  Medication management as above.  The patient was admitted with multiple complaints (vertigo, fatigue).  He remains significantly encephalopathic relative to baseline and unable to stand.  He is taking minimal p.o. intake, and still requires IV fluids.  We will work with PT, and consult neurology.  Follow culture data.    Dispo to be determined.         DVT prophylaxis: N/A on warfarin Code Status: FULL Family Communication: Wife    Subjective: Vertigo seems mostly resolved.  He is still extremely weak, unable to stand up from bed.  No vomiting or diarrhea.  No chest pain, dyspnea, sputum, cough.  No fever.  No dysuria.  No abdominal pain.    Objective: Vitals:   03/25/19 1340 03/25/19 2023 03/26/19 0554 03/26/19 0951  BP: 106/60 (!) 143/55 (!) 167/91 (!) 142/57  Pulse: 66 64 68 65  Resp:  18 18   Temp: 98 F (36.7 C) 97.7 F (36.5 C) 98 F (36.7 C)   TempSrc: Oral Oral Oral   SpO2: 98% 100% 98% 98%  Weight:  83.7 kg   Height:        Intake/Output Summary (Last 24 hours) at 03/26/2019 1127 Last data filed at 03/26/2019 0856 Gross per 24 hour  Intake 360 ml  Output 1450 ml  Net -1090 ml   Filed Weights   03/24/19 0311 03/25/19 0445 03/26/19 0554  Weight: 82 kg 83.9 kg 83.7 kg    Examination: General appearance: Elderly adult male, lying in bed, no acute distress, appears moribund. HEENT: Anicteric, conjunctival pink, lids and lashes normal.  No nasal deformity, discharge, or epistaxis.  Lips dry, dentures in place,  oropharynx very dry, no oral lesions, hearing normal. Skin: Warm and dry, no suspicious rashes or lesions. Cardiac: RRR, systolic murmur, soft, JVP normal, no lower extremity edema. Respiratory: Normal respiratory rate and rhythm, lungs clear without rales or wheezes. Abdomen: Abdomen soft, no tenderness palpation, or guarding.  No ascites or distention.   MSK: Moderate loss of subcutaneous muscle mass and fat diffusely Neuro: Arouses to voice, but falls back asleep.  Barely able to lift arms bilaterally, symmetric strength.  Speech fluent. Psych: Oriented to hospital and year, but extremely sleepy, falls back asleep, attention diminished, affect blunted.    Data Reviewed: I have personally reviewed following labs and imaging studies:  CBC: Recent Labs  Lab 03/23/19 1545 03/26/19 0410  WBC 10.0 8.2  HGB 11.0* 9.5*  HCT 33.8* 29.8*  MCV 93.6 94.6  PLT 168 0000000   Basic Metabolic Panel: Recent Labs  Lab 03/23/19 1545 03/26/19 0410  NA 135 138  K 4.4 3.8  CL 102 105  CO2 25 24  GLUCOSE 122* 102*  BUN 17 26*  CREATININE 1.28* 1.35*  CALCIUM 9.5 9.1   GFR: Estimated Creatinine Clearance: 39.5 mL/min (A) (by C-G formula based on SCr of 1.35 mg/dL (H)). Liver Function Tests: Recent Labs  Lab 03/23/19 1545 03/26/19 0410  AST 16 12*  ALT 6 6  ALKPHOS 60 48  BILITOT 1.1 0.6  PROT 5.9* 4.8*  ALBUMIN 3.3* 2.8*   No results for input(s): LIPASE, AMYLASE in the last 168 hours. Recent Labs  Lab 03/25/19 1150  AMMONIA 16   Coagulation Profile: Recent Labs  Lab 03/23/19 1545 03/24/19 0342 03/25/19 0814 03/26/19 0410  INR 3.2* 2.7* 2.2* 2.4*   Cardiac Enzymes: No results for input(s): CKTOTAL, CKMB, CKMBINDEX, TROPONINI in the last 168 hours. BNP (last 3 results) No results for input(s): PROBNP in the last 8760 hours. HbA1C: No results for input(s): HGBA1C in the last 72 hours. CBG: No results for input(s): GLUCAP in the last 168 hours. Lipid Profile: No  results for input(s): CHOL, HDL, LDLCALC, TRIG, CHOLHDL, LDLDIRECT in the last 72 hours. Thyroid Function Tests: Recent Labs    03/25/19 1150  TSH 1.489   Anemia Panel: Recent Labs    03/25/19 1150  VITAMINB12 3,306*   Urine analysis:    Component Value Date/Time   COLORURINE STRAW (A) 03/23/2019 1750   APPEARANCEUR CLEAR 03/23/2019 1750   LABSPEC 1.006 03/23/2019 1750   LABSPEC 1.020 05/21/2017 1557   PHURINE 7.0 03/23/2019 1750   GLUCOSEU NEGATIVE 03/23/2019 1750   GLUCOSEU Negative 05/21/2017 1557   HGBUR NEGATIVE 03/23/2019 1750   BILIRUBINUR NEGATIVE 03/23/2019 1750   BILIRUBINUR Negative 05/21/2017 1557   KETONESUR NEGATIVE 03/23/2019 1750   PROTEINUR NEGATIVE 03/23/2019 1750   UROBILINOGEN 0.2 05/21/2017 1557   NITRITE NEGATIVE 03/23/2019 1750   LEUKOCYTESUR NEGATIVE 03/23/2019 1750   LEUKOCYTESUR Negative 05/21/2017 1557   Sepsis Labs: @LABRCNTIP (procalcitonin:4,lacticacidven:4)  )  Recent Results (from the past 240 hour(s))  SARS CORONAVIRUS 2 (TAT 6-12 HRS) Nasal Swab Aptima Multi Swab     Status: None   Collection Time: 03/23/19  9:57 PM   Specimen: Aptima Multi Swab; Nasal Swab  Result Value Ref Range Status   SARS Coronavirus 2 NEGATIVE NEGATIVE Final    Comment: (NOTE) SARS-CoV-2 target nucleic acids are NOT DETECTED. The SARS-CoV-2 RNA is generally detectable in upper and lower respiratory specimens during the acute phase of infection. Negative results do not preclude SARS-CoV-2 infection, do not rule out co-infections with other pathogens, and should not be used as the sole basis for treatment or other patient management decisions. Negative results must be combined with clinical observations, patient history, and epidemiological information. The expected result is Negative. Fact Sheet for Patients: SugarRoll.be Fact Sheet for Healthcare Providers: https://www.woods-mathews.com/ This test is not yet  approved or cleared by the [place] FDA and  has been authorized for detection and/or diagnosis of SARS-CoV-2 by FDA under an Emergency Use Authorization (EUA). This EUA will remain  in effect (meaning this test can be used) for the duration of the COVID-19 declaration under Section 56 4(b)(1) of the Act, 21 U.S.C. section 360bbb-3(b)(1), unless the authorization is terminated or revoked sooner. Performed at [facility], [place] [address], [place], [place] [address]   MRSA PCR Screening     Status: None   Collection Time: 03/24/19  5:50 AM   Specimen: Nasopharyngeal  Result Value Ref Range Status   MRSA by PCR NEGATIVE NEGATIVE Final    Comment:        The GeneXpert MRSA Assay (FDA approved for NASAL specimens only), is one component of a comprehensive MRSA colonization surveillance program. It is not intended to diagnose MRSA infection nor to guide or monitor treatment for MRSA infections. Performed at [facility], [place] [address] [place], [place] [address]   Culture, blood (routine x 2)     Status: None (Preliminary result)   Collection Time: 03/25/19  6:40 PM   Specimen: BLOOD RIGHT HAND  Result Value Ref Range Status   Specimen Description BLOOD RIGHT HAND  Final   Special Requests   Final    BOTTLES DRAWN AEROBIC AND ANAEROBIC Blood Culture adequate volume   Culture   Final    NO GROWTH < 24 HOURS Performed at [facility], [place] [address] [place], [place] [address]    Report Status PENDING  Incomplete  Culture, blood (routine x 2)     Status: None (Preliminary result)   Collection Time: 03/25/19  6:50 PM   Specimen: BLOOD LEFT HAND  Result Value Ref Range Status   Specimen Description BLOOD LEFT HAND  Final   Special Requests   Final    BOTTLES DRAWN AEROBIC ONLY Blood Culture adequate volume   Culture   Final    NO GROWTH < 24 HOURS Performed at [facility], [place] [address] [place], [place] [address]    Report Status PENDING   Incomplete         Radiology Studies: Dg Chest 2 View  Result Date: 03/26/2019 CLINICAL DATA:  Weakness. EXAM: CHEST - 2 VIEW COMPARISON:  Chest x-ray dated March 23, 2019. FINDINGS: Unchanged left chest wall pacemaker. Stable cardiomediastinal silhouette status post CABG. Atherosclerotic calcification of the aortic arch. Normal pulmonary vascularity. No focal consolidation, pleural effusion, or pneumothorax. No acute osseous abnormality. IMPRESSION: No active cardiopulmonary disease. Electronically Signed   By:  Titus Dubin M.D.   On: 03/26/2019 09:37   Mr Brain Wo Contrast  Result Date: 03/24/2019 CLINICAL DATA:  85 year old male with dizziness, unsteady gait, hypertensive. EXAM: MRI HEAD WITHOUT CONTRAST TECHNIQUE: Multiplanar, multiecho pulse sequences of the brain and surrounding structures were obtained without intravenous contrast. COMPARISON:  CTA head and neck earlier today. Head CT 03/23/2019 and earlier. FINDINGS: Brain: No restricted diffusion to suggest acute infarction. No midline shift, mass effect, evidence of mass lesion, ventriculomegaly, extra-axial collection or acute intracranial hemorrhage. Cervicomedullary junction and pituitary are within normal limits. Generalized cerebral volume loss. No disproportionate areas of cerebral atrophy identified. Patchy and confluent bilateral cerebral white matter T2 and FLAIR hyperintensity. No cortical encephalomalacia. There are possibly a few scattered chronic microhemorrhages in the brain (inferior right cerebellum series 8, image 4). Small chronic lacunar infarct in the left caudate on series 6, image 16. Mild T2 heterogeneity in the right pons. Small chronic right superior cerebellar infarcts suspected on series 9, image 30. Vascular: Major intracranial vascular flow voids are preserved. Skull and upper cervical spine: Negative visible cervical spine. Normal bone marrow signal. Sinuses/Orbits: Postoperative changes to both globes.  Negative orbits. Mild paranasal sinus mucosal thickening. Other: Mastoids are clear. Visible internal auditory structures appear normal. Scalp and face soft tissues appear negative. IMPRESSION: 1.  No acute intracranial abnormality. 2. Signal changes compatible with chronic small vessel disease, moderate for age. Electronically Signed   By: Genevie Ann M.D.   On: 03/24/2019 15:37        Scheduled Meds:  abiraterone acetate  250 mg Oral See admin instructions   carbidopa-levodopa  1 tablet Oral TID WC   citalopram  10 mg Oral Daily   clonazePAM  0.25 mg Oral BID   donepezil  5 mg Oral BID   labetalol  100 mg Oral BID   Melatonin  3 mg Oral QHS   predniSONE  5 mg Oral Q breakfast   sodium chloride flush  3 mL Intravenous Q12H   sodium chloride flush  3 mL Intravenous Q12H   warfarin  2.5 mg Oral ONCE-1800   Warfarin - Pharmacist Dosing Inpatient   Does not apply q1800   Continuous Infusions:  sodium chloride       LOS: 2 days    Time spent: 35 minutes    Edwin Dada, MD [facility] 03/26/2019, 11:27 AM     Please page through [place]:  [email or web address] Password [email or web address] If 7PM-7AM, please contact night-coverage

## 2019-03-26 NOTE — Progress Notes (Signed)
ANTICOAGULATION CONSULT NOTE - Initial Consult  Pharmacy Consult for Coumadin Indication: atrial fibrillation  Allergies  Allergen Reactions  . Penicillins Hives, Shortness Of Breath and Other (See Comments)    Has patient had a PCN reaction causing immediate rash, facial/tongue/throat swelling, SOB or lightheadedness with hypotension: Yes Has patient had a PCN reaction causing severe rash involving mucus membranes or skin necrosis: No Has patient had a PCN reaction that required hospitalization No Has patient had a PCN reaction occurring within the last 10 years: No If all of the above answers are "NO", then may proceed with Cephalosporin use.   . Simvastatin Other (See Comments)    Reaction:  Muscle weakness    Patient Measurements: Height: 5\' 11"  (180.3 cm) Weight: 184 lb 8.4 oz (83.7 kg) IBW/kg (Calculated) : 75.3  Vital Signs: Temp: 98 F (36.7 C) (08/30 0554) Temp Source: Oral (08/30 0554) BP: 142/57 (08/30 0951) Pulse Rate: 65 (08/30 0951)  Labs: Recent Labs    03/23/19 1545 03/23/19 1745 03/24/19 0342 03/25/19 0814 03/26/19 0410  HGB 11.0*  --   --   --  9.5*  HCT 33.8*  --   --   --  29.8*  PLT 168  --   --   --  162  LABPROT 32.1*  --  28.6* 24.4* 25.9*  INR 3.2*  --  2.7* 2.2* 2.4*  CREATININE 1.28*  --   --   --  1.35*  TROPONINIHS 29* 29*  --   --   --     Estimated Creatinine Clearance: 39.5 mL/min (A) (by C-G formula based on SCr of 1.35 mg/dL (H)).    Assessment: 85 y.o. male admitted with vertigo, h/o Afib, to continue Coumadin. INR upon admission was 3.2. PTA warfarin regimen is 5 mg MWF, 2.5 mg all other days.   INR is therapeutic again today at 2.4. Hgb lower at 9.5 from 11 on 8/27. Plts WNL and stable. No overt bleeding noted. Will proceed using his PTA regimen for tonight.  Goal of Therapy:  INR 2-3 Monitor platelets by anticoagulation protocol: Yes   Plan:  Warfarin 2.5 mg po x 1 tonight Daily INR Monitor for bleeding Consider  continuation of PTA regimen if INR remains therapeutic  Richardine Service, PharmD PGY1 Pharmacy Resident Phone: [phone] 03/26/2019  11:23 AM  Please check [email or web address] for unit-specific pharmacy phone numbers.

## 2019-03-27 ENCOUNTER — Inpatient Hospital Stay (HOSPITAL_COMMUNITY): Payer: Medicare Other

## 2019-03-27 LAB — COMPREHENSIVE METABOLIC PANEL
ALT: 7 U/L (ref 0–44)
AST: 13 U/L — ABNORMAL LOW (ref 15–41)
Albumin: 2.8 g/dL — ABNORMAL LOW (ref 3.5–5.0)
Alkaline Phosphatase: 47 U/L (ref 38–126)
Anion gap: 10 (ref 5–15)
BUN: 24 mg/dL — ABNORMAL HIGH (ref 8–23)
CO2: 21 mmol/L — ABNORMAL LOW (ref 22–32)
Calcium: 9 mg/dL (ref 8.9–10.3)
Chloride: 106 mmol/L (ref 98–111)
Creatinine, Ser: 1.43 mg/dL — ABNORMAL HIGH (ref 0.61–1.24)
GFR calc Af Amer: 50 mL/min — ABNORMAL LOW (ref >60)
GFR calc non Af Amer: 43 mL/min — ABNORMAL LOW (ref >60)
Glucose, Bld: 95 mg/dL (ref 70–99)
Potassium: 3.9 mmol/L (ref 3.5–5.1)
Sodium: 137 mmol/L (ref 135–145)
Total Bilirubin: 0.4 mg/dL (ref 0.3–1.2)
Total Protein: 5 g/dL — ABNORMAL LOW (ref 6.5–8.1)

## 2019-03-27 LAB — CBC
HCT: 31.2 % — ABNORMAL LOW (ref 39.0–52.0)
Hemoglobin: 9.6 g/dL — ABNORMAL LOW (ref 13.0–17.0)
MCH: 30.1 pg (ref 26.0–34.0)
MCHC: 30.8 g/dL (ref 30.0–36.0)
MCV: 97.8 fL (ref 80.0–100.0)
Platelets: 157 10*3/uL (ref 150–400)
RBC: 3.19 MIL/uL — ABNORMAL LOW (ref 4.22–5.81)
RDW: 14.2 % (ref 11.5–15.5)
WBC: 7.6 10*3/uL (ref 4.0–10.5)
nRBC: 0 % (ref 0.0–0.2)

## 2019-03-27 LAB — PROTIME-INR
INR: 2.5 — ABNORMAL HIGH (ref 0.8–1.2)
Prothrombin Time: 26.5 seconds — ABNORMAL HIGH (ref 11.4–15.2)

## 2019-03-27 MED ORDER — WARFARIN SODIUM 5 MG PO TABS
5.0000 mg | ORAL_TABLET | Freq: Once | ORAL | Status: AC
  Administered 2019-03-27: 5 mg via ORAL
  Filled 2019-03-27: qty 1

## 2019-03-27 MED ORDER — CARBIDOPA-LEVODOPA 10-100 MG PO TABS: 0.5000 | ORAL_TABLET | Freq: Three times a day (TID) | ORAL | Status: DC

## 2019-03-27 MED ORDER — CARBIDOPA-LEVODOPA 10-100 MG PO TABS: 0.5000 | ORAL_TABLET | Freq: Two times a day (BID) | ORAL | Status: DC

## 2019-03-27 MED ORDER — SODIUM CHLORIDE 0.9 % IV SOLN
1.5000 g | Freq: Three times a day (TID) | INTRAVENOUS | Status: DC
  Administered 2019-03-27 – 2019-03-29 (×6): 1.5 g via INTRAVENOUS
  Filled 2019-03-27 (×3): qty 4
  Filled 2019-03-27 (×2): qty 1.5
  Filled 2019-03-27: qty 4
  Filled 2019-03-27: qty 1.5
  Filled 2019-03-27: qty 4
  Filled 2019-03-27: qty 1.5
  Filled 2019-03-27: qty 4

## 2019-03-27 MED ORDER — CARBIDOPA-LEVODOPA 10-100 MG PO TABS: 0.5000 | ORAL_TABLET | Freq: Every day | ORAL | Status: DC

## 2019-03-27 MED ORDER — CARBIDOPA-LEVODOPA 10-100 MG PO TABS
1.0000 | ORAL_TABLET | Freq: Three times a day (TID) | ORAL | Status: DC
  Administered 2019-03-27 – 2019-03-29 (×7): 1 via ORAL
  Filled 2019-03-27 (×8): qty 1

## 2019-03-27 NOTE — Progress Notes (Signed)
Patient was cleaned and sacral foam placed. Sacral area red due to moisture association. Turned every two hours by nurse tech.  Spouse at bedside.

## 2019-03-27 NOTE — TOC Progression Note (Addendum)
Transition of Care [facility]) - Progression Note    Patient Details  Name: Edward Woodward MRN: [id number] Date of Birth: August 24, 1926  Transition of Care [facility]) CM/SW [place], [place] Phone Number: 03/27/2019, 12:17 PM  Clinical Narrative:     Update: Patient is set up with [facility] for PT, OT, RN, Aide, and social worker. Patient is set up with [employer] for delivery of hospital bed and hoyer lift to home.   CSW met with patient's wife at bedside who reports she continues to refuse SNF at this time, however would be agreeable to as much home health assistance as possible. She reports also wanting hospital bed and hoyer. She has no preference of home health agency at this time, CSW will see which agency can provide the most services and set patient up with home health and equipment needs.   Patient's wife [name] reports MD and herself have discussed patient being followed by hospice when discharging home and she is open to speaking with hospice. CSW has reached out to hospice liaison Trixie Dredge to please follow up with wife on this.    Expected Discharge Plan: [place] Barriers to Discharge: Continued Medical Work up  Expected Discharge Plan and Services Expected Discharge Plan: [place] arrangements for the past 2 months: Single Family Home Expected Discharge Date: 03/27/19                                     Social Determinants of Health (SDOH) Interventions    Readmission Risk Interventions No flowsheet data found.

## 2019-03-27 NOTE — Care Management Important Message (Signed)
Important Message  Patient Details  Name: JOHNTHOMAS GEEN MRN: [id number] Date of Birth: [birth year]   Medicare Important Message Given:  Yes     Shelda Altes 03/27/2019, 1:20 PM

## 2019-03-27 NOTE — Progress Notes (Signed)
Patient had some dizziness with sitting earlier today.  Orthostatic vitals were negative.  He has no nystagmus on my exam, no ataxia on finger-nose-finger.   Even if this did represent labrynthitis, I would be concerned about steroids and somewhat in his condition.  I would favor conservative treatment with physical therapy.  He had no benefit from Sinemet, I suspect this likely represents Lewy body dementia.  Given that some symptoms seem slightly worse after starting Sinemet, I would favor titrating this down.   At this time, no further recommendations, continue titrating down on Sinemet.  Please call with further questions or concerns.   Roland Rack, MD [facility] [phone]  If 7pm- 7am, please page neurology on call as listed in [place].

## 2019-03-27 NOTE — Progress Notes (Signed)
PT Cancellation Note  Patient Details Name: Edward Woodward MRN: [id number] DOB: 07-Sep-1926   Cancelled Treatment:    Reason Eval/Treat Not Completed: Fatigue/lethargy limiting ability to participate .  Pt lethargic, not participating, eyes closed and asking therapist to come back later so he can rest.  Wife present and attempting to encourage him.  I had a long conversation with her re: our inability to get him up OOB since his admission (with two people helping) and that he has been in the bed for three days getting weaker than his baseline (which sounds pretty weak).  I asked her how she was going to physically manage him at home and she said that she was going to get "help" through the Education officer, museum.  She said, " I think sending him to a nursing home would be a death sentence".  I am hopeful for her sake that he will perk up once home (given his dementia, he may in his familiar environment) but he will certainly be weaker after days in the bed.  At baseline he is ambulatory with a lift chair and a 4 wheeled RW with a seat.  PT left all lights on and window open (explained to wife why) to help keep him aware that it is daytime.  RN made aware.  [facility] services as well as ambulance transport home would be needed to safely return him home at this point.  I continue to strongly recommend SNF level rehab at discharge.  He is at incredibly high fall risk returning home.    Thanks,  Barbarann Ehlers. Kynzie Polgar, PT, DPT  Acute Rehabilitation [phone] pager [phone] office  @ [facility]: [phone]    Harvie Heck 03/27/2019, 11:09 AM

## 2019-03-27 NOTE — Plan of Care (Signed)

## 2019-03-27 NOTE — Progress Notes (Addendum)
PROGRESS NOTE    Edward Woodward  [id number] DOB: January 12, 1927 DOA: 03/23/2019 PCP: Lajean Manes, MD   Brief Narrative:  85 year old with history of non-Hodgkin's lymphoma on chemotherapy, CAD status post CABG, depression, atrial fibrillation on Coumadin, Parkinson's disease with suspected Lewy body dementia, CKD stage III, obstructive sleep apnea on CPAP, sick sinus node with pacer came to the hospital with dizziness and progressive decline in mental status.  Patient underwent extensive work-up including MRI of the brain which was negative.  Seen by neurology who recommends tapering off Sinemet over the course of 2 weeks.   Assessment & Plan:   Principal Problem:   Vertigo Active Problems:   Essential hypertension, benign   OSA on CPAP   Paroxysmal atrial fibrillation (HCC)   CKD (chronic kidney disease), stage III (HCC)   Prostate cancer (HCC)   Supratherapeutic INR   Persistent vertigo of central origin  Acute metabolic encephalopathy, progressive decline Parkinson's disease with advanced dementia -Apparently patient has been rapidly declining over the course of the past few months.  TSH, ammonia and B12 within normal limits.  MRI of the brain is unremarkable. - Supportive care. -Seen by neurology  Urinary tract infection, Enterococcus faecalis - Present prior to admission Start Unasyn.  Pending culture sensitivities  Hypertensive urgency, resolved  History of prostate cancer - On Zytiga and prednisone.  PSA elevated at 32  Paroxysmal atrial fibrillation -Continue beta-blocker and Coumadin.  Stage I sacral ulcer, present prior to admission -Routine care  Parkinson's disease with advanced dementia -Plan to wean off Sinemet over 2 weeks -Continue citalopram and donezepil  Anemia of chronic disease -Stable  Vertigo Stable.  PRN meclizine.  Goals of care= family interested in hospice. Will consult their service.   DVT prophylaxis: On Coumadin Code Status:  Full code Family Communication: Spoke with his wife Disposition Plan: Maintain inpatient stay until his mentation is improved.  Consultants:   Neurology  Procedures:   None  Antimicrobials:   Unasyn day 1   Subjective: Patient is awake and answering basic questions such as his name otherwise doing well.  Minimal meaningful conversation.  No focal neuro deficits.  Follows basic commands.  Review of Systems Otherwise negative except as per HPI, including: General: Denies fever, chills, night sweats or unintended weight loss. Resp: Denies cough, wheezing, shortness of breath. Cardiac: Denies chest pain, palpitations, orthopnea, paroxysmal nocturnal dyspnea. GI: Denies abdominal pain, nausea, vomiting, diarrhea or constipation GU: Denies dysuria, frequency, hesitancy or incontinence MS: Denies muscle aches, joint pain or swelling Neuro: Denies headache, neurologic deficits (focal weakness, numbness, tingling), abnormal gait Psych: Denies anxiety, depression, SI/HI/AVH Skin: Denies new rashes or lesions ID: Denies sick contacts, exotic exposures, travel  Objective: Vitals:   03/27/19 0044 03/27/19 0609 03/27/19 0612 03/27/19 0946  BP: (!) 167/59 (!) 156/132  (!) 169/109  Pulse: 65 (!) 122  65  Resp:  18    Temp:  97.8 F (36.6 C)    TempSrc:  Oral    SpO2:  99%    Weight:   84.7 kg   Height:        Intake/Output Summary (Last 24 hours) at 03/27/2019 1040 Last data filed at 03/27/2019 0926 Gross per 24 hour  Intake 840 ml  Output 1085 ml  Net -245 ml   Filed Weights   03/25/19 0445 03/26/19 0554 03/27/19 0612  Weight: 83.9 kg 83.7 kg 84.7 kg    Examination:  General exam: Appears calm and comfortable, pleasantly confused Respiratory system: Clear  to auscultation. Respiratory effort normal. Cardiovascular system: S1 & S2 heard, RRR. No JVD, murmurs, rubs, gallops or clicks. No pedal edema. Gastrointestinal system: Abdomen is nondistended, soft and nontender. No  organomegaly or masses felt. Normal bowel sounds heard. Central nervous system: Alert to his name but disoriented in terms of where he is and today's date.  No focal neuro deficits. Extremities: Symmetric 4 x 5 power. Skin: No rashes, lesions or ulcers Psychiatry: Poor judgment and insight    Data Reviewed:   CBC: Recent Labs  Lab 03/23/19 1545 03/26/19 0410 03/27/19 0547  WBC 10.0 8.2 7.6  HGB 11.0* 9.5* 9.6*  HCT 33.8* 29.8* 31.2*  MCV 93.6 94.6 97.8  PLT 168 162 [id number]   Basic Metabolic Panel: Recent Labs  Lab 03/23/19 1545 03/26/19 0410 03/27/19 0547  NA 135 138 137  K 4.4 3.8 3.9  CL 102 105 106  CO2 25 24 21*  GLUCOSE 122* 102* 95  BUN 17 26* 24*  CREATININE 1.28* 1.35* 1.43*  CALCIUM 9.5 9.1 9.0   GFR: Estimated Creatinine Clearance: 37.3 mL/min (A) (by C-G formula based on SCr of 1.43 mg/dL (H)). Liver Function Tests: Recent Labs  Lab 03/23/19 1545 03/26/19 0410 03/27/19 0547  AST 16 12* 13*  ALT 6 6 7   ALKPHOS 60 48 47  BILITOT 1.1 0.6 0.4  PROT 5.9* 4.8* 5.0*  ALBUMIN 3.3* 2.8* 2.8*   No results for input(s): LIPASE, AMYLASE in the last 168 hours. Recent Labs  Lab 03/25/19 1150  AMMONIA 16   Coagulation Profile: Recent Labs  Lab 03/23/19 1545 03/24/19 0342 03/25/19 0814 03/26/19 0410 03/27/19 0547  INR 3.2* 2.7* 2.2* 2.4* 2.5*   Cardiac Enzymes: Recent Labs  Lab 03/26/19 1141  CKTOTAL 57   BNP (last 3 results) No results for input(s): PROBNP in the last 8760 hours. HbA1C: No results for input(s): HGBA1C in the last 72 hours. CBG: No results for input(s): GLUCAP in the last 168 hours. Lipid Profile: No results for input(s): CHOL, HDL, LDLCALC, TRIG, CHOLHDL, LDLDIRECT in the last 72 hours. Thyroid Function Tests: Recent Labs    03/25/19 1150  TSH 1.489   Anemia Panel: Recent Labs    03/25/19 1150  VITAMINB12 3,306*   Sepsis Labs: No results for input(s): PROCALCITON, LATICACIDVEN in the last 168 hours.  Recent  Results (from the past 240 hour(s))  SARS CORONAVIRUS 2 (TAT 6-12 HRS) Nasal Swab Aptima Multi Swab     Status: None   Collection Time: 03/23/19  9:57 PM   Specimen: Aptima Multi Swab; Nasal Swab  Result Value Ref Range Status   SARS Coronavirus 2 NEGATIVE NEGATIVE Final    Comment: (NOTE) SARS-CoV-2 target nucleic acids are NOT DETECTED. The SARS-CoV-2 RNA is generally detectable in upper and lower respiratory specimens during the acute phase of infection. Negative results do not preclude SARS-CoV-2 infection, do not rule out co-infections with other pathogens, and should not be used as the sole basis for treatment or other patient management decisions. Negative results must be combined with clinical observations, patient history, and epidemiological information. The expected result is Negative. Fact Sheet for Patients: SugarRoll.be Fact Sheet for Healthcare Providers: https://www.woods-mathews.com/ This test is not yet approved or cleared by the [place] FDA and  has been authorized for detection and/or diagnosis of SARS-CoV-2 by FDA under an Emergency Use Authorization (EUA). This EUA will remain  in effect (meaning this test can be used) for the duration of the COVID-19 declaration under Section 56 4(b)(1)  of the Act, 21 U.S.C. section 360bbb-3(b)(1), unless the authorization is terminated or revoked sooner. Performed at [facility], [place] [address] [place], [place] [address]   MRSA PCR Screening     Status: None   Collection Time: 03/24/19  5:50 AM   Specimen: Nasopharyngeal  Result Value Ref Range Status   MRSA by PCR NEGATIVE NEGATIVE Final    Comment:        The GeneXpert MRSA Assay (FDA approved for NASAL specimens only), is one component of a comprehensive MRSA colonization surveillance program. It is not intended to diagnose MRSA infection nor to guide or monitor treatment for MRSA infections. Performed at  [facility], [place] [address], [place], [place] [address]   Culture, Urine     Status: Abnormal (Preliminary result)   Collection Time: 03/25/19  6:38 PM   Specimen: Urine, Random  Result Value Ref Range Status   Specimen Description URINE, RANDOM  Final   Special Requests NONE  Final   Culture (A)  Final    30,000 COLONIES/mL ENTEROCOCCUS FAECALIS SUSCEPTIBILITIES TO FOLLOW Performed at [facility], [place] [address], [place], [place] [address]    Report Status PENDING  Incomplete  Culture, blood (routine x 2)     Status: None (Preliminary result)   Collection Time: 03/25/19  6:40 PM   Specimen: BLOOD RIGHT HAND  Result Value Ref Range Status   Specimen Description BLOOD RIGHT HAND  Final   Special Requests   Final    BOTTLES DRAWN AEROBIC AND ANAEROBIC Blood Culture adequate volume   Culture   Final    NO GROWTH 2 DAYS Performed at [facility], [place] [address] [place], [place] [address]    Report Status PENDING  Incomplete  Culture, blood (routine x 2)     Status: None (Preliminary result)   Collection Time: 03/25/19  6:50 PM   Specimen: BLOOD LEFT HAND  Result Value Ref Range Status   Specimen Description BLOOD LEFT HAND  Final   Special Requests   Final    BOTTLES DRAWN AEROBIC ONLY Blood Culture adequate volume   Culture   Final    NO GROWTH 2 DAYS Performed at [facility], [place] [address] [place], [place] [address]    Report Status PENDING  Incomplete         Radiology Studies: Dg Chest 2 View  Result Date: 03/26/2019 CLINICAL DATA:  Weakness. EXAM: CHEST - 2 VIEW COMPARISON:  Chest x-ray dated March 23, 2019. FINDINGS: Unchanged left chest wall pacemaker. Stable cardiomediastinal silhouette status post CABG. Atherosclerotic calcification of the aortic arch. Normal pulmonary vascularity. No focal consolidation, pleural effusion, or pneumothorax. No acute osseous abnormality. IMPRESSION: No active cardiopulmonary disease. Electronically  Signed   By: Titus Dubin M.D.   On: 03/26/2019 09:37        Scheduled Meds: . abiraterone acetate  250 mg Oral See admin instructions  . carbidopa-levodopa  1 tablet Oral TID WC  . citalopram  10 mg Oral Daily  . clonazePAM  0.25 mg Oral BID  . donepezil  5 mg Oral BID  . labetalol  100 mg Oral BID  . Melatonin  3 mg Oral QHS  . predniSONE  5 mg Oral Q breakfast  . sodium chloride flush  3 mL Intravenous Q12H  . sodium chloride flush  3 mL Intravenous Q12H  . warfarin  5 mg Oral ONCE-1800  . Warfarin - Pharmacist Dosing Inpatient  Does not apply q1800   Continuous Infusions: . sodium chloride       LOS: 3 days   Time spent= 35 mins    Ankit Arsenio Loader, MD [facility]  If 7PM-7AM, please contact night-coverage [email or web address] 03/27/2019, 10:40 AM

## 2019-03-27 NOTE — Progress Notes (Addendum)
Hydrologist [facility])  Hospital Liaison: RN note    Notified by Pricilla Riffle, CSW of patient/family request for [facility] services at home after discharge. After review by hospice physician, it was determined this patient is more appropriate for the [facility] Palliative Program.  Writer spoke with  Wife, [name] to initiate education related to hospice and palliative programs. [name] verbalized understanding of  information given.    [place] Liaison will follow up with patient's wife to set up first visit with the Palliative team.     Please call with any hospice or palliative related questions.     Thank you for this referral.     Farrel Gordon, RN, CCM  [place] (listed on AMION under [facility] [place] [facility] [place])  [phone]

## 2019-03-27 NOTE — TOC Progression Note (Signed)
Transition of Care [facility]) - Progression Note    Patient Details  Name: Edward Woodward MRN: [id number] Date of Birth: 12-12-1926  Transition of Care [facility]) CM/SW [place], [place] Phone Number: 03/27/2019, 9:59 AM  Clinical Narrative:     CSW notes patient as disoriented and lethargic, therefore called wife on home and mobile phone listed to discuss home health options and receive choice of agency. No answer. CSW lvm on both numbers, pending call back.   Expected Discharge Plan: [place] Barriers to Discharge: Continued Medical Work up  Expected Discharge Plan and Services Expected Discharge Plan: [place] arrangements for the past 2 months: Single Family Home Expected Discharge Date: 03/27/19                                     Social Determinants of Health (SDOH) Interventions    Readmission Risk Interventions No flowsheet data found.

## 2019-03-27 NOTE — Progress Notes (Addendum)
MEDICATION RELATED CONSULT NOTE - INITIAL   Pharmacy Consult for sinemet taper Indication: Parkinson's disease  Allergies  Allergen Reactions  . Penicillins Hives, Shortness Of Breath and Other (See Comments)    Has patient had a PCN reaction causing immediate rash, facial/tongue/throat swelling, SOB or lightheadedness with hypotension: Yes Has patient had a PCN reaction causing severe rash involving mucus membranes or skin necrosis: No Has patient had a PCN reaction that required hospitalization No Has patient had a PCN reaction occurring within the last 10 years: No If all of the above answers are "NO", then may proceed with Cephalosporin use.   . Simvastatin Other (See Comments)    Reaction:  Muscle weakness    Patient Measurements: Height: 5\' 11"  (180.3 cm) Weight: 186 lb 11.7 oz (84.7 kg) IBW/kg (Calculated) : 75.3  Vital Signs: Temp: 97.8 F (36.6 C) (08/31 0609) Temp Source: Oral (08/31 0609) BP: 169/109 (08/31 0946) Pulse Rate: 65 (08/31 0946) Intake/Output from previous day: 08/30 0701 - 08/31 0700 In: 840 [P.O.:840] Out: 765 [Urine:765] Intake/Output from this shift: Total I/O In: -  Out: 320 [Urine:320]  Labs: Recent Labs    03/26/19 0410 03/27/19 0547  WBC 8.2 7.6  HGB 9.5* 9.6*  HCT 29.8* 31.2*  PLT 162 157  CREATININE 1.35* 1.43*  ALBUMIN 2.8* 2.8*  PROT 4.8* 5.0*  AST 12* 13*  ALT 6 7  ALKPHOS 48 47  BILITOT 0.6 0.4   Estimated Creatinine Clearance: 37.3 mL/min (A) (by C-G formula based on SCr of 1.43 mg/dL (H)).   Microbiology: Recent Results (from the past 720 hour(s))  SARS CORONAVIRUS 2 (TAT 6-12 HRS) Nasal Swab Aptima Multi Swab     Status: None   Collection Time: 03/23/19  9:57 PM   Specimen: Aptima Multi Swab; Nasal Swab  Result Value Ref Range Status   SARS Coronavirus 2 NEGATIVE NEGATIVE Final    Comment: (NOTE) SARS-CoV-2 target nucleic acids are NOT DETECTED. The SARS-CoV-2 RNA is generally detectable in upper and  lower respiratory specimens during the acute phase of infection. Negative results do not preclude SARS-CoV-2 infection, do not rule out co-infections with other pathogens, and should not be used as the sole basis for treatment or other patient management decisions. Negative results must be combined with clinical observations, patient history, and epidemiological information. The expected result is Negative. Fact Sheet for Patients: SugarRoll.be Fact Sheet for Healthcare Providers: https://www.woods-mathews.com/ This test is not yet approved or cleared by the [place] FDA and  has been authorized for detection and/or diagnosis of SARS-CoV-2 by FDA under an Emergency Use Authorization (EUA). This EUA will remain  in effect (meaning this test can be used) for the duration of the COVID-19 declaration under Section 56 4(b)(1) of the Act, 21 U.S.C. section 360bbb-3(b)(1), unless the authorization is terminated or revoked sooner. Performed at [facility], [place] [address], [place], [place] [address]   MRSA PCR Screening     Status: None   Collection Time: 03/24/19  5:50 AM   Specimen: Nasopharyngeal  Result Value Ref Range Status   MRSA by PCR NEGATIVE NEGATIVE Final    Comment:        The GeneXpert MRSA Assay (FDA approved for NASAL specimens only), is one component of a comprehensive MRSA colonization surveillance program. It is not intended to diagnose MRSA infection nor to guide or monitor treatment for MRSA infections. Performed at [facility], [place] [address], [place], [place] [address]   Culture, Urine  Status: Abnormal (Preliminary result)   Collection Time: 03/25/19  6:38 PM   Specimen: Urine, Random  Result Value Ref Range Status   Specimen Description URINE, RANDOM  Final   Special Requests NONE  Final   Culture (A)  Final    30,000 COLONIES/mL ENTEROCOCCUS FAECALIS SUSCEPTIBILITIES TO FOLLOW Performed at  [facility], [place] [address] [place], [place] [address]    Report Status PENDING  Incomplete  Culture, blood (routine x 2)     Status: None (Preliminary result)   Collection Time: 03/25/19  6:40 PM   Specimen: BLOOD RIGHT HAND  Result Value Ref Range Status   Specimen Description BLOOD RIGHT HAND  Final   Special Requests   Final    BOTTLES DRAWN AEROBIC AND ANAEROBIC Blood Culture adequate volume   Culture   Final    NO GROWTH 2 DAYS Performed at [facility], [place] [address] [place], [place] [address]    Report Status PENDING  Incomplete  Culture, blood (routine x 2)     Status: None (Preliminary result)   Collection Time: 03/25/19  6:50 PM   Specimen: BLOOD LEFT HAND  Result Value Ref Range Status   Specimen Description BLOOD LEFT HAND  Final   Special Requests   Final    BOTTLES DRAWN AEROBIC ONLY Blood Culture adequate volume   Culture   Final    NO GROWTH 2 DAYS Performed at [facility], [place] [address], [place], [place] [address]    Report Status PENDING  Incomplete    Medical History: Past Medical History:  Diagnosis Date  . AMD (age-related macular degeneration), bilateral   . Anemia   . Anginal pain ([place])    pts wife states pt had to use nitroglycerin this am 04/09/2016  . CAD (coronary artery disease)     NYHA Class 1B, CABG 1985  . CHF (congestive heart failure) ([place])   . Collagen vascular disease ([place])   . Collapsed lung    right   . Complication of anesthesia    pts wife states pt gets confused   . Degeneration of lumbar or lumbosacral intervertebral disc   . Depressive disorder, not elsewhere classified   . Dysrhythmia   . GERD (gastroesophageal reflux disease)   . History of blood transfusion   . History of hiatal hernia   . HTN (hypertension)   . Hypercholesteremia   . Lower leg edema    bilat  . Multiple falls   . Myocardial infarction ([place])   . Neuromuscular disorder (HCC)    neuropathy  . Neuropathy   . Non Hodgkin's  lymphoma ([place])   . OSA on CPAP   . PE (pulmonary embolism) 03/2015  . Presence of permanent cardiac pacemaker   . Prostate cancer ([place])   . Shortness of breath dyspnea    with exertion   . Sinoatrial node dysfunction (HCC)   . Ulcers of both lower legs (HCC)    history of   . Urinary incontinence     Assessment: 49 yoM admitted with vertigo symptoms and AMS. Pt has known hx of Parkinsons and is on Sinemet (Carbidopa-levodopa) 25-100mg  PO TID as of ~1 month ago. Neurology consulted who noted that Sinemet has not helped pt symptomatically and may have worsened symptoms. Pharmacy asked to assist with titrating off over 2 week period to lower risk of NMS.    Plan:  -Reduce Sinemet dose to 10-100mg  one tablet TID x5 days then reduce  to 1/2 tablet (5-50mg ) TID x4 days then reduce to 1/2 tablet (5-50mg ) BID x3 days then 1/2 tablet (5-50mg ) once daily x2 days and stop -Monitor closely for signs of neuroleptic malignant syndrome (fever, tachycardia, muscle rigidity, hyper/hypotension) -Pharmacy will sign off, reconsult if needed   Arrie Senate, PharmD, BCPS Clinical Pharmacist [phone] Please check AMION for all [place] numbers 03/27/2019

## 2019-03-27 NOTE — Progress Notes (Signed)
   03/27/2019  Patient suffers from vertigo, UTI, encepholopathy which impairs their ability to perform daily activities like sitting up, standing and walking in the home.  A regular bed will not resolve the issues with performing activities of daily living. A hospital bed will allow patient and his wife (primary caregiver) to safely perform daily activities such as sitting upright to eat, assisting in turning and providing a higher surface to stand.  His wife can provide assistance, but a hospital bed will decrease his burden of care.   Thanks,  Barbarann Ehlers. Mamoru Takeshita, PT, DPT  Acute Rehabilitation [phone] pager [phone]) [phone] office  @ [place] [place]: [phone]

## 2019-03-27 NOTE — Progress Notes (Addendum)
Physical Therapy Treatment/Vestibular Patient Details Name: Edward Woodward MRN: [id number] DOB: 07/08/1927 Today's Date: 03/27/2019    History of Present Illness Edward Woodward is a 85 y.o.M with PMH/o p-Afib  on warfarin, CAD, status post remote CABG, parkinsonism with suspected Lewy body dementia, CKD stage III, and OSA on CPAP, now presenting to ED for evaluation of dizziness, general malaise, nausea, vertigo, and elevated blood pressure.  CT and MRI Head negative for acute changes.    PT Comments    Pt was able to sit EOB with me today with heavy assist to get to sitting, but supervision once feet were touching.  He was unable to attempt standing as dizziness/ N/V interfered before we got that far.  He has signs and symptoms consistent with R ear hypofunction likely neuritis/labarynthitis with significant symptoms with gaze stability testing (vomited). Orthostatic vitals taken from supine to sitting were negative.  PT will continue efforts at safe mobility.  Wife present throughout the session.      03/27/19 1436  Orthostatic Lying   BP- Lying 132/52  Pulse- Lying 65  Orthostatic Sitting  BP- Sitting 167/54  Pulse- Sitting 64  Orthostatic Standing at 0 minutes  BP- Standing at 0 minutes  (unable)  Oxygen Therapy  SpO2 98 %  O2 Device Room Air     Follow Up Recommendations  Home health PT;Supervision/Assistance - 24 hour(wife is refusing SNF)     Equipment Recommendations  Hospital bed;Other (comment)(ambulance transport home, hoyer lift)    Recommendations for Other Services   NA     Precautions / Restrictions Precautions Precautions: Fall    Mobility  Bed Mobility Overal bed mobility: Needs Assistance Bed Mobility: Supine to Sit;Sit to Supine     Supine to sit: Max assist;HOB elevated Sit to supine: Mod assist;HOB elevated   General bed mobility comments: Max assist to help progress bil legs and trunk to EOB.  Head up feature ultimately used to help in ease  of transitions to EOB.  Assist needed to lift both legs and hand over hand to find railing to help return to supine.   Transfers                 General transfer comment: We were going to attempt, but pt became nauseated and started vomiting.          Balance Overall balance assessment: Needs assistance Sitting-balance support: Feet supported;Bilateral upper extremity supported Sitting balance-Leahy Scale: Poor Sitting balance - Comments: started out needing mod assist to prevent posterior LOB, but once scooted forward so that feet could touch, pt was able to be close supervision with bil UE prop EOB.  Postural control: Posterior lean;Right lateral lean                     03/27/19 0001  Symptom Behavior  Subjective history of current problem "dizzy"  Type of Dizziness  Lightheadedness;Spinning  Frequency of Dizziness intermittent  Duration of Dizziness long >1 min  Symptom Nature Motion provoked;Positional;Intermittent  Aggravating Factors Looking up to the ceiling;Activity in general  Relieving Factors Lying supine;Rest  Progression of Symptoms No change since onset  History of similar episodes no  Oculomotor Exam  Oculomotor Alignment Normal  Spontaneous Absent  Gaze-induced  Left beating nystagmus with L gaze  Smooth Pursuits Saccades (which would be normal give his age)  Vestibulo-Ocular Reflex  VOR 1 Head Only (x 1 viewing) dizziness with horizontal, but significant dizziness with vertical head shaking so much  so that he vomited.    Auditory  Comments HOH at baseline  Positional Testing  Dix-Hallpike  (NT, but no sensation of dizziness rolling or moving EOB/back)                   Cognition Arousal/Alertness: Awake/alert Behavior During Therapy: WFL for tasks assessed/performed Overall Cognitive Status: History of cognitive impairments - at baseline                                 General Comments: Wife recommended coming back  after lunch as he is usually more active/alert in the afternoon and she was correct, he was much more perky and participative this PM than during my attempts this AM.               Pertinent Vitals/Pain Pain Assessment: No/denies pain           PT Goals (current goals can now be found in the care plan section) Acute Rehab PT Goals Patient Stated Goal: none stated, wife wants him home Progress towards PT goals: Progressing toward goals    Frequency    Min 3X/week      PT Plan Discharge plan needs to be updated;Frequency needs to be updated       AM-PAC PT "6 Clicks" Mobility   Outcome Measure  Help needed turning from your back to your side while in a flat bed without using bedrails?: A Lot Help needed moving from lying on your back to sitting on the side of a flat bed without using bedrails?: Total Help needed moving to and from a bed to a chair (including a wheelchair)?: Total Help needed standing up from a chair using your arms (e.g., wheelchair or bedside chair)?: Total Help needed to walk in hospital room?: Total Help needed climbing 3-5 steps with a railing? : Total 6 Click Score: 7    End of Session Equipment Utilized During Treatment: Gait belt Activity Tolerance: Other (comment)(limited by N/V) Patient left: in bed;with call bell/phone within reach;with family/visitor present;with nursing/sitter in room Nurse Communication: Mobility status;Other (comment) PT Visit Diagnosis: Dizziness and giddiness (R42);Other abnormalities of gait and mobility (R26.89)     Time: [id number] PT Time Calculation (min) (ACUTE ONLY): 47 min  Charges:  $Therapeutic Activity: 38-52 mins                    Gillie Fleites B. Marques Ericson, PT, DPT  Acute Rehabilitation [phone] pager [phone] office  @ [facility]: [phone]   03/27/2019, 4:22 PM

## 2019-03-27 NOTE — Plan of Care (Signed)
  Problem: Safety: Goal: Ability to remain free from injury will improve Outcome: Progressing   

## 2019-03-27 NOTE — Progress Notes (Addendum)
Pharmacy Antibiotic Note  Edward Woodward is a 85 y.o. male admitted on 03/23/2019 with vertigo.  Pharmacy has been consulted for Unasyn dosing for possible UTI. Urine culture growing 30k colonies of Enterococcus, this likely reflects contaminant but given hx of prostate cancer and unclear AMS cause, MD wishes to treat. Hives/SOB allergy noted with PCN, per allergy history has not occurred within past 10 years. Dr. Reesa Chew wants to go ahead and treat with Unasyn and monitor.  Plan: Unasyn 1.5g IV q8h   Height: 5\' 11"  (180.3 cm) Weight: 186 lb 11.7 oz (84.7 kg) IBW/kg (Calculated) : 75.3  Temp (24hrs), Avg:98.1 F (36.7 C), Min:97.8 F (36.6 C), Max:98.3 F (36.8 C)  Recent Labs  Lab 03/23/19 1545 03/26/19 0410 03/27/19 0547  WBC 10.0 8.2 7.6  CREATININE 1.28* 1.35* 1.43*    Estimated Creatinine Clearance: 37.3 mL/min (A) (by C-G formula based on SCr of 1.43 mg/dL (H)).    Allergies  Allergen Reactions  . Penicillins Hives, Shortness Of Breath and Other (See Comments)    Has patient had a PCN reaction causing immediate rash, facial/tongue/throat swelling, SOB or lightheadedness with hypotension: Yes Has patient had a PCN reaction causing severe rash involving mucus membranes or skin necrosis: No Has patient had a PCN reaction that required hospitalization No Has patient had a PCN reaction occurring within the last 10 years: No If all of the above answers are "NO", then may proceed with Cephalosporin use.   . Simvastatin Other (See Comments)    Reaction:  Muscle weakness    Antimicrobials this admission: Unasyn 8/31 >>  Microbiology results: 8/28 MRSA: negative 8/29 UCx: 30k Enterococcus Faecalis 8/29 BCx: NGTD  Thank you for allowing pharmacy to be a part of this patient's care.  Arrie Senate, PharmD, BCPS Clinical Pharmacist [phone] Please check AMION for all [place] numbers 03/27/2019

## 2019-03-27 NOTE — Progress Notes (Signed)
ANTICOAGULATION CONSULT NOTE - [place] for Coumadin Indication: atrial fibrillation  Allergies  Allergen Reactions  . Penicillins Hives, Shortness Of Breath and Other (See Comments)    Has patient had a PCN reaction causing immediate rash, facial/tongue/throat swelling, SOB or lightheadedness with hypotension: Yes Has patient had a PCN reaction causing severe rash involving mucus membranes or skin necrosis: No Has patient had a PCN reaction that required hospitalization No Has patient had a PCN reaction occurring within the last 10 years: No If all of the above answers are "NO", then may proceed with Cephalosporin use.   . Simvastatin Other (See Comments)    Reaction:  Muscle weakness    Patient Measurements: Height: 5\' 11"  (180.3 cm) Weight: 186 lb 11.7 oz (84.7 kg) IBW/kg (Calculated) : 75.3  Vital Signs: Temp: 97.8 F (36.6 C) (08/31 0609) Temp Source: Oral (08/31 0609) BP: 156/132 (08/31 0609) Pulse Rate: 122 (08/31 0609)  Labs: Recent Labs    03/25/19 0814 03/26/19 0410 03/26/19 1141 03/27/19 0547  HGB  --  9.5*  --  9.6*  HCT  --  29.8*  --  31.2*  PLT  --  162  --  157  LABPROT 24.4* 25.9*  --  26.5*  INR 2.2* 2.4*  --  2.5*  CREATININE  --  1.35*  --  1.43*  CKTOTAL  --   --  57  --     Estimated Creatinine Clearance: 37.3 mL/min (A) (by C-G formula based on SCr of 1.43 mg/dL (H)).    Assessment: 85 y.o. male admitted with vertigo, h/o Afib, to continue Coumadin. INR upon admission was 3.2. PTA warfarin regimen is 5 mg MWF, 2.5 mg all other days.   INR remains therapeutic at 2.5, H/H stable.  Goal of Therapy:  INR 2-3 Monitor platelets by anticoagulation protocol: Yes   Plan:  Warfarin 5mg  PO x1 tonight Daily INR  Arrie Senate, PharmD, BCPS Clinical Pharmacist [phone] Please check AMION for all [place] numbers 03/27/2019

## 2019-03-27 NOTE — Progress Notes (Addendum)
Pt vomiting while working with PT. Nurse was called. Upon assessment pt stated that felt like needed to vomit before standing up.   Zofran IV 4mg  PRN medication given. Pt was assisted to lay back in bed. Pt's spouse at bedside.  Secure chat sent to MD to make aware.

## 2019-03-28 LAB — URINE CULTURE: Culture: 30000 — AB

## 2019-03-28 LAB — CBC
HCT: 29.3 % — ABNORMAL LOW (ref 39.0–52.0)
Hemoglobin: 9.3 g/dL — ABNORMAL LOW (ref 13.0–17.0)
MCH: 30.7 pg (ref 26.0–34.0)
MCHC: 31.7 g/dL (ref 30.0–36.0)
MCV: 96.7 fL (ref 80.0–100.0)
Platelets: 146 10*3/uL — ABNORMAL LOW (ref 150–400)
RBC: 3.03 MIL/uL — ABNORMAL LOW (ref 4.22–5.81)
RDW: 14.1 % (ref 11.5–15.5)
WBC: 6.5 10*3/uL (ref 4.0–10.5)
nRBC: 0 % (ref 0.0–0.2)

## 2019-03-28 LAB — PROTIME-INR
INR: 3.1 — ABNORMAL HIGH (ref 0.8–1.2)
Prothrombin Time: 31.5 seconds — ABNORMAL HIGH (ref 11.4–15.2)

## 2019-03-28 LAB — COMPREHENSIVE METABOLIC PANEL
ALT: 5 U/L (ref 0–44)
AST: 12 U/L — ABNORMAL LOW (ref 15–41)
Albumin: 2.6 g/dL — ABNORMAL LOW (ref 3.5–5.0)
Alkaline Phosphatase: 46 U/L (ref 38–126)
Anion gap: 9 (ref 5–15)
BUN: 23 mg/dL (ref 8–23)
CO2: 22 mmol/L (ref 22–32)
Calcium: 8.8 mg/dL — ABNORMAL LOW (ref 8.9–10.3)
Chloride: 105 mmol/L (ref 98–111)
Creatinine, Ser: 1.29 mg/dL — ABNORMAL HIGH (ref 0.61–1.24)
GFR calc Af Amer: 57 mL/min — ABNORMAL LOW (ref >60)
GFR calc non Af Amer: 49 mL/min — ABNORMAL LOW (ref >60)
Glucose, Bld: 95 mg/dL (ref 70–99)
Potassium: 3.9 mmol/L (ref 3.5–5.1)
Sodium: 136 mmol/L (ref 135–145)
Total Bilirubin: 0.6 mg/dL (ref 0.3–1.2)
Total Protein: 5.1 g/dL — ABNORMAL LOW (ref 6.5–8.1)

## 2019-03-28 MED ORDER — ABIRATERONE ACETATE 250 MG PO TABS
1000.0000 mg | ORAL_TABLET | Freq: Every day | ORAL | Status: DC
  Administered 2019-03-29: 1000 mg via ORAL
  Filled 2019-03-28 (×2): qty 4

## 2019-03-28 MED ORDER — WARFARIN SODIUM 2.5 MG PO TABS
2.5000 mg | ORAL_TABLET | Freq: Once | ORAL | Status: AC
  Administered 2019-03-28: 2.5 mg via ORAL
  Filled 2019-03-28: qty 1

## 2019-03-28 NOTE — Consult Note (Signed)
   [facility] CM Inpatient Consult   03/28/2019  Edward Woodward Dec 23, 1926 [id number]     Patientscreened to check for potential needs of [facility] care management services with a29% high risk score for unplanned readmissions and hospitalizations, as benefit from his Medicare/ Next[employer] plan. Patient had been previously outreached by [place] coordinator for disease management, [facility] SW for safety evaluation due to falls and [facility] Telephonic CM for EMMI follow-up calls.  Our community based plan of care has focused on disease management and community resource support.  Per review of patient's medical record and MD history & physical on 03/23/19, show as follows:   Edward Woodward is a 85 y.o. male with medical history significant for paroxysmal atrial fibrillation on warfarin, coronary artery disease status post remote CABG, parkinsonism with suspected Lewy body dementia, chronic kidney disease stage III, and OSA on CPAP, sick sinus node with pacer.   presented to the emergency department for evaluation of general malaise, nausea, vertigo, elevated blood pressure  and progressive decline in mental status. (Urinary tract infection-Enterococcus faecalis, hypertensive urgency, Parkinson's disease with advanced dementia)  His primary care provider isDr.Hal Woodward with [facility] at [facility], listed to provide transition of care follow-up.  Called to speak with patient in his room with the help of patient's RN. RN reports that patient is very HOH and with cognitive impairment. Permission was obtained to speak to his wife Edward Woodward). Call placed and spoke with patient's wife at home. HIPAA verified. Discussed with wife about [facility] care management services. Wife reports having no concerns with medications (wife managing); pharmacy (using [facility]); transportation provided by her and she serves as the primary caregiver for patient, with daughter Edward Woodward C.- RN) as her back up. Patient's wife  declined [facility] care management services as well as EMMI calls for follow-up at this time, stating she does not want "too many people involved for now" and believes that they have everything they need to manage patient' care at home, as well as being aware to contact primary care provider if future health needs will arise.  Wife reports having blood pressure device and weighing scale at home to monitor BP/ weight and encouraged to bring readings to provider on follow-up visit. She is also knowledgeable of patient's diet restrictions and when to call provider for help.  Review ofTherapy notes (with vestibular assessment) and had strongly recommended SNF (skilled nursing facility) at discharge as patient is at incredibly high fall risk returning home, however, transition of care SW notes show that patient's wife continues to refuse SNF at this time (did not feel comfortable with sending patient to a facility due to [place]), but would be agreeable to as much home health assistance as possible. She had also requested hospital bed and hoyer lift (with [employer]). Patient is set-up with [facility] home health (as verified) for PT, OT, RN, Aide, and Education officer, museum.  Patient lived at home with his wife and required a rolling walker for mobility and assist with bathing.  Plan:Will continue to follow progress and disposition to assess for post hospital care needs.  Of note, [facility] Care Management services does not replace or interfere with any services that are arranged by transition of care case management or social work.   For questionsand referral, pleasecontact:  Edwena Felty A. Rethel Sebek, BSN, RN-BC [facility] Liaison Cell: [phone]

## 2019-03-28 NOTE — Progress Notes (Signed)
Occupational Therapy Treatment Patient Details Name: Edward Woodward MRN: [id number] DOB: Oct 31, 1926 Today's Date: 03/28/2019    History of present illness Edward Woodward is a 85 y.o.M with PMH/o p-Afib  on warfarin, CAD, status post remote CABG, parkinsonism with suspected Lewy body dementia, CKD stage III, and OSA on CPAP, now presenting to ED for evaluation of dizziness, general malaise, nausea, vertigo, and elevated blood pressure.  CT and MRI Head negative for acute changes.   OT comments  Patient engaged in OT/PT cotx, pt progressing slowly.  Completing bed mobility with mod assist +2, maintained sitting balance EOB progressing to min guard for safety.  Utilized stedy for sit to stand with min assist +2 from elevated EOB, total assist to toileting hygiene in standing then transferred to recliner.  Spouse preference for home health services, therefore updated dc plan to [facility] and aide (he will need maximal services).  Will follow acutely.     Follow Up Recommendations  Home health OT;Supervision/Assistance - 24 hour;Other (comment)(HH aide (maximal services))    Equipment Recommendations  3 in 1 bedside commode    Recommendations for Other Services      Precautions / Restrictions Precautions Precautions: Fall Restrictions Weight Bearing Restrictions: No       Mobility Bed Mobility Overal bed mobility: Needs Assistance Bed Mobility: Supine to Sit     Supine to sit: Mod assist;+2 for physical assistance;HOB elevated     General bed mobility comments: patient able to initate B LEs towards EOB, min assist to initate reaching with UEs to rail and mod assist +2 to ascend trunk/scoot towards EOB   Transfers Overall transfer level: Needs assistance   Transfers: Sit to/from Stand Sit to Stand: Min assist;+2 safety/equipment;+2 physical assistance;From elevated surface         General transfer comment: min assist +2 to power up, for safety and balance; cueing for hand  placement and technique from elevated bed    Balance Overall balance assessment: Needs assistance Sitting-balance support: Feet supported;Bilateral upper extremity supported Sitting balance-Leahy Scale: Fair Sitting balance - Comments: inital posterior lean, fading to min guard for safety; pt preference to B UE support    Standing balance support: Bilateral upper extremity supported;During functional activity Standing balance-Leahy Scale: Poor Standing balance comment: relaint on B UE and external support                           ADL either performed or assessed with clinical judgement   ADL Overall ADL's : Needs assistance/impaired     Grooming: Wash/dry face;Set up;Sitting Grooming Details (indicate cue type and reason): seated in recliner                  Toilet Transfer: Moderate assistance;+2 for physical assistance Toilet Transfer Details (indicate cue type and reason): using stedy Toileting- Clothing Manipulation and Hygiene: Total assistance;+2 for physical assistance;Sit to/from stand       Functional mobility during ADLs: Moderate assistance;+2 for physical assistance;+2 for safety/equipment;Cueing for safety;Cueing for sequencing(using stedy) General ADL Comments: patient limited by impaired balance, decreased activity tolerance, and dizziness      Vision       Perception     Praxis      Cognition Arousal/Alertness: Awake/alert Behavior During Therapy: WFL for tasks assessed/performed Overall Cognitive Status: History of cognitive impairments - at baseline  General Comments: patient able to follow simple commands with increased time, engages appropriately throughout session         Exercises     Shoulder Instructions       General Comments pt reports a little dizziness but "not like yesterday"     Pertinent Vitals/ Pain       Pain Assessment: No/denies pain  Home Living                                           Prior Functioning/Environment              Frequency  Min 2X/week        Progress Toward Goals  OT Goals(current goals can now be found in the care plan section)  Progress towards OT goals: Progressing toward goals     Plan Frequency remains appropriate;Discharge plan needs to be updated    Co-evaluation    PT/OT/SLP Co-Evaluation/Treatment: Yes Reason for Co-Treatment: For patient/therapist safety;To address functional/ADL transfers   OT goals addressed during session: ADL's and self-care      AM-PAC OT "6 Clicks" Daily Activity     Outcome Measure   Help from another person eating meals?: A Little Help from another person taking care of personal grooming?: A Little Help from another person toileting, which includes using toliet, bedpan, or urinal?: Total Help from another person bathing (including washing, rinsing, drying)?: A Lot Help from another person to put on and taking off regular upper body clothing?: A Lot Help from another person to put on and taking off regular lower body clothing?: Total 6 Click Score: 12    End of Session Equipment Utilized During Treatment: Gait belt;Other (comment)(stedy)  OT Visit Diagnosis: Other abnormalities of gait and mobility (R26.89);Muscle weakness (generalized) (M62.81);History of falling (Z91.81);Other symptoms and signs involving cognitive function;Other symptoms and signs involving the nervous system (R29.898)   Activity Tolerance Patient tolerated treatment well   Patient Left in chair;with call bell/phone within reach;with chair alarm set   Nurse Communication Mobility status;Need for lift equipment;Precautions        Time: 1414-1435 OT Time Calculation (min): 21 min  Charges: OT General Charges $OT Visit: 1 Visit OT Treatments $Self Care/Home Management : 8-22 mins  Delight Stare, OT Acute Rehabilitation Services Pager [phone] Office  [phone]    Delight Stare 03/28/2019, 2:50 PM

## 2019-03-28 NOTE — Progress Notes (Signed)
Physical Therapy Treatment Patient Details Name: Edward Woodward MRN: [id number] DOB: 24-Feb-1927 Today's Date: 03/28/2019    History of Present Illness Edward Woodward is a 85 y.o.M with PMH/o p-Afib  on warfarin, CAD, status post remote CABG, parkinsonism with suspected Lewy body dementia, CKD stage III, and OSA on CPAP, now presenting to ED for evaluation of dizziness, general malaise, nausea, vertigo, and elevated blood pressure.  CT and MRI Head negative for acute changes.    PT Comments    Patient progressing to OOB this session and with minimal c/o dizziness.  Still limited tolerance to standing and heavy UE support for standing.  Though feel pt would be appropriate for SNF, wife concerned for pt's cognition, etc and prefers home, therefore recommend HHPT, aide and equipment as noted below.    Follow Up Recommendations  Home health PT;Supervision/Assistance - 24 hour     Equipment Recommendations  Hospital bed;Other (comment)(hoyer lift)    Recommendations for Other Services       Precautions / Restrictions Precautions Precautions: Fall Restrictions Weight Bearing Restrictions: No    Mobility  Bed Mobility Overal bed mobility: Needs Assistance Bed Mobility: Supine to Sit     Supine to sit: Mod assist;+2 for physical assistance;HOB elevated     General bed mobility comments: patient able to initate B LEs towards EOB, min assist to initate reaching with UEs to rail and mod assist +2 to ascend trunk/scoot towards EOB   Transfers Overall transfer level: Needs assistance   Transfers: Sit to/from Stand;Stand Pivot Transfers Sit to Stand: Min assist;+2 safety/equipment;+2 physical assistance;From elevated surface Stand pivot transfers: Total assist       General transfer comment: min A from elevated surface for sit to stand, stood for hygiene due to BM, then A to chair via [place]  Ambulation/Gait                 Stairs             Wheelchair  Mobility    Modified Rankin (Stroke Patients Only)       Balance Overall balance assessment: Needs assistance Sitting-balance support: Feet supported;Bilateral upper extremity supported Sitting balance-Leahy Scale: Poor Sitting balance - Comments: inital posterior lean, fading to min guard for safety; pt preference to B UE support    Standing balance support: Bilateral upper extremity supported;During functional activity Standing balance-Leahy Scale: Poor Standing balance comment: relaint on B UE and external support                            Cognition Arousal/Alertness: Awake/alert Behavior During Therapy: WFL for tasks assessed/performed Overall Cognitive Status: History of cognitive impairments - at baseline                                 General Comments: patient able to follow simple commands with increased time, engages appropriately throughout session       Exercises      General Comments General comments (skin integrity, edema, etc.): c/o mild dizziness once in chair, but no c/o severe dizziness or nausea      Pertinent Vitals/Pain Pain Assessment: No/denies pain    Home Living                      Prior Function            PT Goals (current  goals can now be found in the care plan section) Progress towards PT goals: Progressing toward goals    Frequency    Min 3X/week      PT Plan Current plan remains appropriate    Co-evaluation PT/OT/SLP Co-Evaluation/Treatment: Yes Reason for Co-Treatment: For patient/therapist safety;To address functional/ADL transfers PT goals addressed during session: Mobility/safety with mobility;Strengthening/ROM OT goals addressed during session: ADL's and self-care      AM-PAC PT "6 Clicks" Mobility   Outcome Measure  Help needed turning from your back to your side while in a flat bed without using bedrails?: A Lot Help needed moving from lying on your back to sitting on the side  of a flat bed without using bedrails?: A Lot Help needed moving to and from a bed to a chair (including a wheelchair)?: A Lot Help needed standing up from a chair using your arms (e.g., wheelchair or bedside chair)?: Total Help needed to walk in hospital room?: Total Help needed climbing 3-5 steps with a railing? : Total 6 Click Score: 9    End of Session Equipment Utilized During Treatment: Gait belt Activity Tolerance: Patient tolerated treatment well Patient left: in chair;with call bell/phone within reach;with chair alarm set Nurse Communication: Mobility status PT Visit Diagnosis: Dizziness and giddiness (R42);Other abnormalities of gait and mobility (R26.89)     Time: [id number] PT Time Calculation (min) (ACUTE ONLY): 27 min  Charges:  $Therapeutic Activity: 8-22 mins                     Magda Kiel, [place] [phone] 03/28/2019    Reginia Naas 03/28/2019, 4:58 PM

## 2019-03-28 NOTE — TOC Progression Note (Signed)
Transition of Care [facility]) - Progression Note    Patient Details  Name: Edward Woodward MRN: [id number] Date of Birth: [birth year]  Transition of Care [facility]) CM/SW [place], [place] Phone Number: [phone] 03/28/2019, 2:02 PM  Clinical Narrative:     CSW spoke with [employer] who reports patient's spouse [name] requested hospital bed to be delivered tomorrow 9/2, and hoyer lift will be delivered with the hospital bed.   CSW relayed information to [name], she reports no further questions at this time. .  Plan for hoyer bed and hospital bed to be set up in home tomorrow 9/2. Patient will need PTAR transport home.    Expected Discharge Plan: [place] Barriers to Discharge: Continued Medical Work up  Expected Discharge Plan and Services Expected Discharge Plan: [place] arrangements for the past 2 months: Single Family Home Expected Discharge Date: 03/27/19                                     Social Determinants of Health (SDOH) Interventions    Readmission Risk Interventions No flowsheet data found.

## 2019-03-28 NOTE — Progress Notes (Signed)
ANTICOAGULATION CONSULT NOTE - [place] for Coumadin Indication: atrial fibrillation  Allergies  Allergen Reactions  . Penicillins Hives, Shortness Of Breath and Other (See Comments)    Has patient had a PCN reaction causing immediate rash, facial/tongue/throat swelling, SOB or lightheadedness with hypotension: Yes Has patient had a PCN reaction causing severe rash involving mucus membranes or skin necrosis: No Has patient had a PCN reaction that required hospitalization No Has patient had a PCN reaction occurring within the last 10 years: No If all of the above answers are "NO", then may proceed with Cephalosporin use.   . Simvastatin Other (See Comments)    Reaction:  Muscle weakness    Patient Measurements: Height: 5\' 11"  (180.3 cm) Weight: 186 lb 8.2 oz (84.6 kg) IBW/kg (Calculated) : 75.3  Vital Signs: Temp: 98.4 F (36.9 C) (09/01 0557) Temp Source: Oral (09/01 0557) BP: 142/65 (09/01 0557) Pulse Rate: 65 (09/01 0557)  Labs: Recent Labs    03/26/19 0410 03/26/19 1141 03/27/19 0547 03/28/19 0500  HGB 9.5*  --  9.6* 9.3*  HCT 29.8*  --  31.2* 29.3*  PLT 162  --  157 146*  LABPROT 25.9*  --  26.5* 31.5*  INR 2.4*  --  2.5* 3.1*  CREATININE 1.35*  --  1.43* 1.29*  CKTOTAL  --  57  --   --     Estimated Creatinine Clearance: 41.3 mL/min (A) (by C-G formula based on SCr of 1.29 mg/dL (H)).    Assessment: 85 y.o. male admitted with vertigo, h/o Afib, to continue Coumadin. INR upon admission was 3.2. PTA warfarin regimen is 5 mg MWF, 2.5 mg all other days.   INR slightly high at 3.1, H/H stable.  Goal of Therapy:  INR 2-3 Monitor platelets by anticoagulation protocol: Yes   Plan:  Warfarin 2.5mg  PO x1 tonight (home regimen) Daily INR  Anette Guarneri, PharmD Clinical Pharmacist [phone] Please check AMION for all [facility] Pharmacy numbers 03/28/2019

## 2019-03-28 NOTE — Progress Notes (Signed)
PROGRESS NOTE  Edward Woodward [id number] DOB: June 29, 1927 DOA: 03/23/2019 PCP: Lajean Manes, MD  Brief History   85 year old with history of non-Hodgkin's lymphoma on chemotherapy, CAD status post CABG, depression, atrial fibrillation on Coumadin, Parkinson's disease with suspected Lewy body dementia, CKD stage III, obstructive sleep apnea on CPAP, sick sinus node with pacer came to the hospital with dizziness and progressive decline in mental status.  Patient underwent extensive work-up including MRI of the brain which was negative.  Seen by neurology who recommends tapering off Sinemet over the course of 2 weeks.  Plan is for the patient to discharge to home with palliative care when arrangements for DME have been made.  Consultants  . Neurology  Procedures  . None  Antibiotics   Anti-infectives (From admission, onward)   Start     Dose/Rate Route Frequency Ordered Stop   03/27/19 1115  ampicillin-sulbactam (UNASYN) 1.5 g in sodium chloride 0.9 % 100 mL IVPB     1.5 g 200 mL/hr over 30 Minutes Intravenous Every 8 hours 03/27/19 1102      .  Marland Kitchen   Subjective  The patient is resting comfortably. No new complaints.  Objective   Vitals:  Vitals:   03/28/19 1107 03/28/19 1129  BP: 138/62 (!) 132/53  Pulse: 65 64  Resp: 15 16  Temp: (!) 97.5 F (36.4 C) (!) 97.5 F (36.4 C)  SpO2: 94% 95%    Exam:  Constitutional:  . The patient is resting comfortably. No new complaints. Respiratory:  . No increased work of breathing. . No wheezes, rales, or rhonchi . No tactile fremitus. Cardiovascular:  . Regular rate and rhythm. . No murmurs, ectopy, or gallups. . No lateral PMI. No thrills. Abdomen:  . Abdomen is soft, non-tender, non-distended. . No hernias, masses, or organomegaly. . Normoactive bowel sounds. Musculoskeletal:  . No cyanosis, clubbing, or edema. Skin:  . No rashes, lesions, ulcers . palpation of skin: no induration or nodules Neurologic:  . CN  2-12 intact . Sensation all 4 extremities intact Psychiatric:  . Mental status o Mood, affect appropriate o Orientation to person, place, time  . judgment and insight appear intact   I have personally reviewed the following:   Today's Data  . Vitals, CMP, CBC, INR  Scheduled Meds: . abiraterone acetate  250 mg Oral See admin instructions  . carbidopa-levodopa  1 tablet Oral TID   Followed by  . [START ON 04/01/2019] carbidopa-levodopa  0.5 tablet Oral TID   Followed by  . [START ON 04/05/2019] carbidopa-levodopa  0.5 tablet Oral BID   Followed by  . [START ON 04/08/2019] carbidopa-levodopa  0.5 tablet Oral Daily  . citalopram  10 mg Oral Daily  . clonazePAM  0.25 mg Oral BID  . donepezil  5 mg Oral BID  . labetalol  100 mg Oral BID  . Melatonin  3 mg Oral QHS  . predniSONE  5 mg Oral Q breakfast  . sodium chloride flush  3 mL Intravenous Q12H  . sodium chloride flush  3 mL Intravenous Q12H  . Warfarin - Pharmacist Dosing Inpatient   Does not apply q1800   Continuous Infusions: . sodium chloride 250 mL (03/28/19 1855)  . ampicillin-sulbactam (UNASYN) IV 1.5 g (03/28/19 1855)    Principal Problem:   Vertigo Active Problems:   Essential hypertension, benign   OSA on CPAP   Paroxysmal atrial fibrillation (HCC)   CKD (chronic kidney disease), stage III (HCC)   Prostate cancer ([place])  Supratherapeutic INR   Persistent vertigo of central origin   LOS: 4 days   A & P  Acute metabolic encephalopathy, progressive decline due to Parkinson's disease with advanced dementia: Apparently patient has been rapidly declining over the course of the past few months.  TSH, ammonia and B12 within normal limits.  MRI of the brain is unremarkable. Supportive care. Seen by neurology who recommends the tapering off of Sinemet over the course of 2 weeks.  Urinary tract infection:  Urine culture has grown out Enterococcus faecalis. The patient is receiving IV Unasyn. Sensitivities pending.    Hypertensive urgency, resolved  History of prostate cancer: On Zytiga and prednisone.  PSA elevated at 32.  Paroxysmal atrial fibrillation: Continue beta-blocker and Coumadin.  Stage I sacral ulcer, present prior to admission. Routine care.  Parkinson's disease with advanced dementia: Plan to wean off Sinemet over 2 weeks per neurology's recommendation. Continue citalopram and donezepil.  Anemia of chronic disease: Stable  Vertigo: Stable.  PRN meclizine.  Goals of care= family interested in hospice. Will consult their service. Am waiting for DME to be set up at home.  I have seen and examined this patient myself. I have spent 32 minutes in his evaluation and care.  DVT prophylaxis: On Coumadin Code Status: Full code Family Communication: Spoke with his wife Disposition Plan: Maintain inpatient stay until his mentation is improved.  Adelyne Marchese, DO [facility] Direct contact: see [email or web address]  7PM-7AM contact night coverage as above 03/28/2019, 7:01 PM  LOS: 4 days

## 2019-03-28 NOTE — Progress Notes (Signed)
Spoke with wife about bring in pts home medications.

## 2019-03-29 ENCOUNTER — Telehealth: Payer: Self-pay

## 2019-03-29 LAB — COMPREHENSIVE METABOLIC PANEL
ALT: <5 U/L (ref 0–44)
AST: 11 U/L — ABNORMAL LOW (ref 15–41)
Albumin: 2.7 g/dL — ABNORMAL LOW (ref 3.5–5.0)
Alkaline Phosphatase: 44 U/L (ref 38–126)
Anion gap: 6 (ref 5–15)
BUN: 24 mg/dL — ABNORMAL HIGH (ref 8–23)
CO2: 24 mmol/L (ref 22–32)
Calcium: 9 mg/dL (ref 8.9–10.3)
Chloride: 109 mmol/L (ref 98–111)
Creatinine, Ser: 1.25 mg/dL — ABNORMAL HIGH (ref 0.61–1.24)
GFR calc Af Amer: 59 mL/min — ABNORMAL LOW (ref >60)
GFR calc non Af Amer: 51 mL/min — ABNORMAL LOW (ref >60)
Glucose, Bld: 100 mg/dL — ABNORMAL HIGH (ref 70–99)
Potassium: 4.1 mmol/L (ref 3.5–5.1)
Sodium: 139 mmol/L (ref 135–145)
Total Bilirubin: 0.4 mg/dL (ref 0.3–1.2)
Total Protein: 5.1 g/dL — ABNORMAL LOW (ref 6.5–8.1)

## 2019-03-29 LAB — CBC
HCT: 30.2 % — ABNORMAL LOW (ref 39.0–52.0)
Hemoglobin: 9.6 g/dL — ABNORMAL LOW (ref 13.0–17.0)
MCH: 30 pg (ref 26.0–34.0)
MCHC: 31.8 g/dL (ref 30.0–36.0)
MCV: 94.4 fL (ref 80.0–100.0)
Platelets: 152 10*3/uL (ref 150–400)
RBC: 3.2 MIL/uL — ABNORMAL LOW (ref 4.22–5.81)
RDW: 13.9 % (ref 11.5–15.5)
WBC: 6.2 10*3/uL (ref 4.0–10.5)
nRBC: 0 % (ref 0.0–0.2)

## 2019-03-29 LAB — PROTIME-INR
INR: 2.5 — ABNORMAL HIGH (ref 0.8–1.2)
Prothrombin Time: 26.3 seconds — ABNORMAL HIGH (ref 11.4–15.2)

## 2019-03-29 MED ORDER — CARBIDOPA-LEVODOPA 10-100 MG PO TABS: 0.5000 | ORAL_TABLET | Freq: Three times a day (TID) | ORAL | 0 refills | Status: AC

## 2019-03-29 MED ORDER — CLONAZEPAM 0.25 MG PO TBDP: 0.2500 mg | ORAL_TABLET | Freq: Two times a day (BID) | ORAL | 0 refills | Status: AC

## 2019-03-29 MED ORDER — CARBIDOPA-LEVODOPA 10-100 MG PO TABS: 0.5000 | ORAL_TABLET | Freq: Two times a day (BID) | ORAL | 0 refills | Status: AC

## 2019-03-29 MED ORDER — LABETALOL HCL 100 MG PO TABS: 100.0000 mg | ORAL_TABLET | Freq: Two times a day (BID) | ORAL | 0 refills | Status: AC

## 2019-03-29 MED ORDER — WARFARIN SODIUM 5 MG PO TABS
5.0000 mg | ORAL_TABLET | Freq: Once | ORAL | Status: AC
  Administered 2019-03-29: 5 mg via ORAL
  Filled 2019-03-29: qty 1

## 2019-03-29 MED ORDER — CARBIDOPA-LEVODOPA 10-100 MG PO TABS: 0.5000 | ORAL_TABLET | Freq: Every day | ORAL | 0 refills | Status: AC

## 2019-03-29 MED ORDER — AMOXICILLIN-POT CLAVULANATE 875-125 MG PO TABS: 1.0000 | ORAL_TABLET | Freq: Two times a day (BID) | ORAL | 0 refills | Status: AC

## 2019-03-29 MED ORDER — CARBIDOPA-LEVODOPA 10-100 MG PO TABS: 1.0000 | ORAL_TABLET | Freq: Three times a day (TID) | ORAL | 0 refills | Status: AC

## 2019-03-29 MED FILL — LABETALOL HCL 100 MG TABLET: 100 | 30 days supply | Qty: 60 | Fill #0

## 2019-03-29 MED FILL — CARBIDOPA-LEVODOPA 10-100 M: 10-100 | 12 days supply | Qty: 18 | Fill #0

## 2019-03-29 MED FILL — AMOX-CLAV 875-125 MG TABLET: 875-125 | 6 days supply | Qty: 12 | Fill #0

## 2019-03-29 NOTE — Progress Notes (Signed)
RT offered pt CPAP for the night pt stated he does not wear one and does not wish to at this time. RT expressed to pt that if he changed his mind to call. RT will continue to monitor.

## 2019-03-29 NOTE — Progress Notes (Signed)
Occupational Therapy Treatment Patient Details Name: Edward Woodward MRN: [id number] DOB: 10-01-1926 Today's Date: 03/29/2019    History of present illness GERIK BRAMLETT is a 85 y.o.M with PMH/o p-Afib  on warfarin, CAD, status post remote CABG, parkinsonism with suspected Lewy body dementia, CKD stage III, and OSA on CPAP, now presenting to ED for evaluation of dizziness, general malaise, nausea, vertigo, and elevated blood pressure.  CT and MRI Head negative for acute changes.   OT comments  Patient progressing slowly.  Used RW for [facility] transfer with mod assist +2 today, total assist for toileting (1+ assist for balance and 1+ assist for hygiene) while standing after +BM.  Fatigues easily.  Plan for dc home today, continued to recommend maximal HH services at dc.    Follow Up Recommendations  Home health OT;Supervision/Assistance - 24 hour;Other (comment)(HH aide (maximal assist))    Equipment Recommendations  3 in 1 bedside commode    Recommendations for Other Services      Precautions / Restrictions Precautions Precautions: Fall Restrictions Weight Bearing Restrictions: No       Mobility Bed Mobility Overal bed mobility: Needs Assistance Bed Mobility: Supine to Sit     Supine to sit: Mod assist;HOB elevated     General bed mobility comments: EOB upon entry   Transfers Overall transfer level: Needs assistance Equipment used: Rolling walker (2 wheeled);1 person hand held assist Transfers: Sit to/from [facility] Sit to Stand: Mod assist;+2 physical assistance;+2 safety/equipment;From elevated surface Stand pivot transfers: Mod assist;+2 physical assistance;+2 safety/equipment;From elevated surface       General transfer comment: mod assist to power up, for safety and balance with cueing for hand placement     Balance Overall balance assessment: Needs assistance Sitting-balance support: Feet supported;Bilateral upper extremity supported Sitting  balance-Leahy Scale: Fair Sitting balance - Comments: fair once set   Standing balance support: Bilateral upper extremity supported;During functional activity Standing balance-Leahy Scale: Poor Standing balance comment: relaint on BUE and external support                           ADL either performed or assessed with clinical judgement   ADL Overall ADL's : Needs assistance/impaired     Grooming: Wash/dry face;Set up;Sitting                   Toilet Transfer: Moderate assistance;+2 for physical assistance;+2 for safety/equipment;Stand-pivot;BSC Toilet Transfer Details (indicate cue type and reason): using RW  Toileting- Clothing Manipulation and Hygiene: Total assistance;+2 for physical assistance;Sit to/from stand Toileting - Clothing Manipulation Details (indicate cue type and reason): total assist for hygiene after +BM     Functional mobility during ADLs: Moderate assistance;+2 for physical assistance;+2 for safety/equipment;Cueing for safety;Cueing for sequencing       Vision       Perception     Praxis      Cognition Arousal/Alertness: Awake/alert Behavior During Therapy: Flat affect Overall Cognitive Status: History of cognitive impairments - at baseline                                 General Comments: increased time for processing and initation, but [facility]         Exercises     Shoulder Instructions       General Comments mild dizziness resolved     Pertinent Vitals/ Pain  Pain Assessment: No/denies pain  Home Living                                          Prior Functioning/Environment              Frequency  Min 2X/week        Progress Toward Goals  OT Goals(current goals can now be found in the care plan section)  Progress towards OT goals: Progressing toward goals  Acute Rehab OT Goals Patient Stated Goal: to get home  OT Goal Formulation: With patient  Plan Discharge plan  remains appropriate;Frequency remains appropriate    Co-evaluation    PT/OT/SLP Co-Evaluation/Treatment: Yes Reason for Co-Treatment: For patient/therapist safety;To address functional/ADL transfers   OT goals addressed during session: ADL's and self-care      AM-PAC OT "6 Clicks" Daily Activity     Outcome Measure   Help from another person eating meals?: A Little Help from another person taking care of personal grooming?: A Little Help from another person toileting, which includes using toliet, bedpan, or urinal?: Total Help from another person bathing (including washing, rinsing, drying)?: A Lot Help from another person to put on and taking off regular upper body clothing?: A Lot Help from another person to put on and taking off regular lower body clothing?: Total 6 Click Score: 12    End of Session Equipment Utilized During Treatment: Rolling walker  OT Visit Diagnosis: Other abnormalities of gait and mobility (R26.89);Muscle weakness (generalized) (M62.81);History of falling (Z91.81);Other symptoms and signs involving cognitive function;Other symptoms and signs involving the nervous system (R29.898)   Activity Tolerance Patient tolerated treatment well   Patient Left in chair;with call bell/phone within reach;with chair alarm set   Nurse Communication Mobility status        Time: 1345-1413 OT Time Calculation (min): 28 min  Charges: OT General Charges $OT Visit: 1 Visit OT Treatments $Self Care/Home Management : 8-22 mins  Delight Stare, OT Acute Rehabilitation Services Pager [phone] Office [phone]     Delight Stare 03/29/2019, 5:07 PM

## 2019-03-29 NOTE — Progress Notes (Signed)
Spoke with Edward Woodward and Equipment was just  delivered and just need to be set spouse will call when everything is ready.

## 2019-03-29 NOTE — Telephone Encounter (Signed)
Phone call placed to [facility] to follow up on request for bed/hoyer lift. DME to be delivered today.

## 2019-03-29 NOTE — Progress Notes (Signed)
Physical Therapy Treatment Patient Details Name: Edward Woodward MRN: [id number] DOB: 10/22/1926 Today's Date: 03/29/2019    History of Present Illness MIKE DAFT is a 85 y.o.M with PMH/o p-Afib  on warfarin, CAD, status post remote CABG, parkinsonism with suspected Lewy body dementia, CKD stage III, and OSA on CPAP, now presenting to ED for evaluation of dizziness, general malaise, nausea, vertigo, and elevated blood pressure.  CT and MRI Head negative for acute changes.    PT Comments    Pt was seen for mobility on RW and with PT assisting directly to sidestep to [facility] and chair. He is able to assist steps but fearful of the effort and of falling.  He will need to be assisted to stand with RW and cues for posture to get cleaned up from [facility] and to work on endurance/balance for gait.  Left up in chair with chair alarm, and will be transferred to home.    Follow Up Recommendations  Home health PT;Supervision/Assistance - 24 hour     Equipment Recommendations  Hospital bed;Other (comment)    Recommendations for Other Services       Precautions / Restrictions Precautions Precautions: Fall Restrictions Weight Bearing Restrictions: No    Mobility  Bed Mobility Overal bed mobility: Needs Assistance Bed Mobility: Supine to Sit     Supine to sit: Mod assist;HOB elevated     General bed mobility comments: Uses LUE to push off the bedrail to assist scooting to EOB  Transfers Overall transfer level: Needs assistance Equipment used: Rolling walker (2 wheeled);1 person hand held assist Transfers: Sit to/from [facility] Sit to Stand: Mod assist;+2 physical assistance;+2 safety/equipment;From elevated surface Stand pivot transfers: Mod assist;+2 physical assistance;+2 safety/equipment;From elevated surface          Ambulation/Gait             General Gait Details: Pt is only taking steps to transfer to chair and [facility]   Stairs              Wheelchair Mobility    Modified Rankin (Stroke Patients Only)       Balance Overall balance assessment: Needs assistance Sitting-balance support: Feet supported;Bilateral upper extremity supported Sitting balance-Leahy Scale: Fair Sitting balance - Comments: fair once set   Standing balance support: Bilateral upper extremity supported;During functional activity Standing balance-Leahy Scale: Poor Standing balance comment: better with two person help                            Cognition Arousal/Alertness: Awake/alert Behavior During Therapy: Flat affect Overall Cognitive Status: History of cognitive impairments - at baseline                                 General Comments: follows instructions with extrat time      Exercises      General Comments General comments (skin integrity, edema, etc.): nauseated with sitting until after using the [facility] for a bowel movement      Pertinent Vitals/Pain Pain Assessment: (nauseated)    Home Living                      Prior Function            PT Goals (current goals can now be found in the care plan section) Progress towards PT goals: Progressing toward goals  Frequency    Min 3X/week      PT Plan Current plan remains appropriate    Co-evaluation              AM-PAC PT "6 Clicks" Mobility   Outcome Measure  Help needed turning from your back to your side while in a flat bed without using bedrails?: A Little Help needed moving from lying on your back to sitting on the side of a flat bed without using bedrails?: A Lot Help needed moving to and from a bed to a chair (including a wheelchair)?: A Lot Help needed standing up from a chair using your arms (e.g., wheelchair or bedside chair)?: A Lot Help needed to walk in hospital room?: A Lot Help needed climbing 3-5 steps with a railing? : Total 6 Click Score: 12    End of Session Equipment Utilized During Treatment: Gait  belt Activity Tolerance: Patient limited by fatigue;Treatment limited secondary to medical complications (Comment) Patient left: in chair;with call bell/phone within reach;with chair alarm set Nurse Communication: Mobility status PT Visit Diagnosis: Unsteadiness on feet (R26.81);Muscle weakness (generalized) (M62.81);Difficulty in walking, not elsewhere classified (R26.2)     Time: 1340-1415 PT Time Calculation (min) (ACUTE ONLY): 35 min  Charges:  $Therapeutic Activity: 23-37 mins                    Ramond Dial 03/29/2019, 3:57 PM   Mee Hives, PT MS Acute Rehab Dept. Number: [place]

## 2019-03-29 NOTE — TOC Transition Note (Signed)
Transition of Care [facility]) - CM/SW Discharge Note   Patient Details  Name: DASHTON CALDARELLI MRN: [id number] Date of Birth: 1926/09/09  Transition of Care [facility]) CM/SW Contact:  Alberteen Sam, [place] Phone Number: [phone] 03/29/2019, 3:27 PM   Clinical Narrative:     Patient will DC to: Home Anticipated DC date: 03/29/2019 Family notified: [name] Transport [id number]  Per MD patient ready for DC to Home . RN, patient, patient's family, notified of DC. Patient set up with [facility] for [facility] PT, OT, RN and Aide, [employer] has delivered equipment of hospital bed and hoyer to the home. Ambulance transport requested for patient for 6:00 pm per Janice's request. [place] signing off.  [place], [place]   Final next level of care: Home w Home Health Services Barriers to Discharge: No Barriers Identified   Patient Goals and CMS Choice Patient states their goals for this hospitalization and ongoing recovery are:: to go home CMS Medicare.gov Compare Post Acute Care list provided to:: Patient Choice offered to / list presented to : Patient  Discharge Placement                Patient to be transferred to facility by: [place] Name of family member notified: [name] Patient and family notified of of transfer: 03/29/19  Discharge Plan and Services                DME Arranged: Hospital bed(hoyer) DME Agency: [employer]     Representative spoke with at DME Agency: [place]: Nurse's Aide, RN, OT, PT [place] Agency: [facility]     Representative spoke with at [place]: [place] ([place]) Interventions     Readmission Risk Interventions No flowsheet data found.

## 2019-03-29 NOTE — Progress Notes (Signed)
Manufacturing engineer [facility]) RN Note  Patient daughter Mickle Plumb) reached out today to get an update on the [place] after discharge. Discussed Palliative services and gave contact information for [facility] Palliative Team. Made [facility] Palliative Program Administrator aware who will reach out to daughter to set up services and answer questions regarding plan after possible discharge today.   Please call with any Hospice/Palliative concerns or questions,  Thank you,  Gar Ponto, RN [place] (listed on [facility]) [phone]

## 2019-03-29 NOTE — Progress Notes (Signed)
CSW spoke with patient's wife [name] reports she is at the home awaiting delivery of hospital bed and hoyer lift. CSW followed up with equipment company [employer] should be delivered this afternoon.   Pending delivery of equipment for patient's discharge.   [place], [place]

## 2019-03-29 NOTE — Progress Notes (Signed)
ANTICOAGULATION CONSULT NOTE - [place] for Coumadin Indication: atrial fibrillation  Allergies  Allergen Reactions  . Penicillins Hives, Shortness Of Breath and Other (See Comments)    Has patient had a PCN reaction causing immediate rash, facial/tongue/throat swelling, SOB or lightheadedness with hypotension: Yes Has patient had a PCN reaction causing severe rash involving mucus membranes or skin necrosis: No Has patient had a PCN reaction that required hospitalization No Has patient had a PCN reaction occurring within the last 10 years: No If all of the above answers are "NO", then may proceed with Cephalosporin use.   . Simvastatin Other (See Comments)    Reaction:  Muscle weakness    Patient Measurements: Height: 5\' 11"  (180.3 cm) Weight: 185 lb 13.6 oz (84.3 kg) IBW/kg (Calculated) : 75.3  Vital Signs: Temp: 98.3 F (36.8 C) (09/02 0534) Temp Source: Oral (09/02 0534) BP: 145/62 (09/02 0534) Pulse Rate: 65 (09/02 0534)  Labs: Recent Labs    03/26/19 1141  03/27/19 0547 03/28/19 0500 03/29/19 0505  HGB  --    < > 9.6* 9.3* 9.6*  HCT  --   --  31.2* 29.3* 30.2*  PLT  --   --  157 146* 152  LABPROT  --   --  26.5* 31.5* 26.3*  INR  --   --  2.5* 3.1* 2.5*  CREATININE  --   --  1.43* 1.29* 1.25*  CKTOTAL 57  --   --   --   --    < > = values in this interval not displayed.    Estimated Creatinine Clearance: 42.7 mL/min (A) (by C-G formula based on SCr of 1.25 mg/dL (H)).    Assessment: 85 y.o. male admitted with vertigo, h/o Afib, to continue Coumadin. INR upon admission was 3.2. PTA warfarin regimen is 5 mg MWF, 2.5 mg all other days.   INR therapeutic  Goal of Therapy:  INR 2-3 Monitor platelets by anticoagulation protocol: Yes   Plan:  Continue home dose - 5 mg MWF, 2.5 mg TThSS  - > 5 mg po x 1 tonight Daily INR  Anette Guarneri, PharmD Clinical Pharmacist [phone] Please check AMION for all [facility]  numbers 03/29/2019

## 2019-03-29 NOTE — Discharge Summary (Addendum)
Physician Discharge Summary  Edward Woodward [id number] DOB: July 09, 1927 DOA: 03/23/2019  PCP: Lajean Manes, MD  Admit date: 03/23/2019 Discharge date: 03/29/2019  Recommendations for Outpatient Follow-up:  Follow up with palliative care as outpatient Follow up with PCP in 7-10 days. 3.   Home health PT/OT and RN Follow-up Information     Lajean Manes, MD.   Specialty: Internal Medicine Contact information: [address] [place] [address] [phone]         Care, [facility] Follow up.   Specialty: [place] Why: [facility] services will reach out to schedule days/times for PT, OT, RN, Aide and social worker to come to the home. Please contact them with any questions at [phone]. Contact information: [place] [address] [phone]             Discharge Diagnoses: Principal diagnosis is #1 Parkinsons with advanced dementia Vertigo UTI Hypertensive urgency History of prostate cancer Paroxysmal atrial fibrillation Stage 1 sacral ulcer ([place] prior to admission) Anemia of chronic disease CHF - Clinically undetermined.  Discharge Condition: Fair Disposition: Home with palliative care.  Diet recommendation: Heart healthy with carbohydrate controlled diet.  Filed Weights   03/27/19 0612 03/28/19 0608 03/29/19 0556  Weight: 84.7 kg 84.6 kg 84.3 kg    History of present illness:  Edward Woodward is a 85 y.o. male with medical history significant for paroxysmal atrial fibrillation on warfarin, coronary artery disease status post remote CABG, parkinsonism with suspected Lewy body dementia, chronic kidney disease stage III, and OSA on CPAP, now presenting to the emergency department for evaluation of general malaise, nausea, vertigo, and elevated blood pressure.  Patient is accompanied by his wife who assists with the history.  He is normally able to ambulate to the bathroom and  about the house, but has been very unsteady on his feet and requiring intensive assistance on the day of presentation.  He complains of dizziness which he describes as a sensation that the room is spinning, worse when he tries to get up and better while at rest though still present.  He has had nausea without vomiting.  Denies headache.  He reported vision and hearing deficit prior to arrival in the ED, his wife reports that this is a frequent complaint of his, and patient denied this at time of admission.  He had also complained of transient left face and upper extremity tingling prior to arrival that also resolved.   ED Course: Upon arrival to the ED, patient is found to be afebrile, saturating mid 90s on room air, hypertensive to [id number] systolic.  EKG features sinus rhythm with incomplete RBBB and nonspecific repolarization abnormality.  Chest x-ray is negative for acute cardiopulmonary disease and noncontrast head CT is negative for acute intracranial abnormality.  Chemistry panel is notable for creatinine 1.28, similar to priors.  CBC is notable for mild stable normocytic anemia.  INR is 3.2.  Troponin is mildly elevated and unchanged after 2 hours.  There was concern for possible hypertensive emergency and case was discussed with critical care who advised giving 20 mg IV hydralazine; blood pressure normalized with this and medical admission was recommended.  Case was also discussed with neurology who recommended CTA head and neck, MRI brain, and formal consultation as needed based on these imaging studies.  Hospital Course:  Edward Woodward year old with history of non-Hodgkin's lymphoma on chemotherapy, CAD status post CABG, depression, atrial fibrillation on Coumadin, Parkinson's disease with suspected  Lewy body dementia, CKD stage III, obstructive sleep apnea on CPAP, sick sinus node with pacer came to the hospital with dizziness and progressive decline in mental status.  Patient underwent extensive work-up including  MRI of the brain which was negative.  Seen by neurology who recommends tapering off Sinemet over the course of 2 weeks.   Plan is for the patient to discharge to home with palliative care now that arrangements for DME have been made.  Today's assessment: S: The patient is resting comfortably. No new complaints. O: Vitals:  Vitals:   03/29/19 0534 03/29/19 1108  BP: (!) 145/62 (!) 166/60  Pulse: 65 64  Resp: 20 18  Temp: 98.3 F (36.8 C) 98.6 F (37 C)  SpO2: 95% 97%   Constitutional:  The patient is resting comfortably. No new complaints. Respiratory:  No increased work of breathing. No wheezes, rales, or rhonchi No tactile fremitus. Cardiovascular:  Regular rate and rhythm. No murmurs, ectopy, or gallups. No lateral PMI. No thrills. Abdomen:  Abdomen is soft, non-tender, non-distended. No hernias, masses, or organomegaly. Normoactive bowel sounds. Musculoskeletal:  No cyanosis, clubbing, or edema. Skin:  No rashes, lesions, ulcers palpation of skin: no induration or nodules Neurologic:  CN 2-12 intact Sensation all 4 extremities intact Psychiatric:  Mental status Mood, affect appropriate Orientation to person, place, time  judgment and insight appear intact    Discharge Instructions  Discharge Instructions     Activity as tolerated - No restrictions   Complete by: As directed    Amb Referral to Palliative Care   Complete by: As directed    Call MD for:  difficulty breathing, headache or visual disturbances   Complete by: As directed    Call MD for:  persistant nausea and vomiting   Complete by: As directed    Call MD for:  severe uncontrolled pain   Complete by: As directed    Call MD for:  temperature >100.4   Complete by: As directed    Diet - low sodium heart healthy   Complete by: As directed    Discharge instructions   Complete by: As directed    Home with palliative care. Follow up with PCP in 7-10 days.   For home use only DME Other see  comment   Complete by: As directed    [employer].   Length of Need: Lifetime   Increase activity slowly   Complete by: As directed       Allergies as of 03/29/2019       Reactions   Penicillins Hives, Shortness Of Breath, Other (See Comments)   Has patient had a PCN reaction causing immediate rash, facial/tongue/throat swelling, SOB or lightheadedness with hypotension: Yes Has patient had a PCN reaction causing severe rash involving mucus membranes or skin necrosis: No Has patient had a PCN reaction that required hospitalization No Has patient had a PCN reaction occurring within the last 10 years: No If all of the above answers are "NO", then may proceed with Cephalosporin use.   Simvastatin Other (See Comments)   Reaction:  Muscle weakness        Medication List     STOP taking these medications    carbidopa-levodopa 25-100 MG tablet Commonly known as: SINEMET IR Replaced by: carbidopa-levodopa 10-100 MG tablet   clonazePAM 0.5 MG tablet Commonly known as: KLONOPIN Replaced by: clonazePAM 0.25 MG disintegrating tablet   metoprolol succinate 25 MG 24 hr tablet Commonly known as: TOPROL-XL   torsemide 20 MG  tablet Commonly known as: DEMADEX       TAKE these medications    acetaminophen 500 MG tablet Commonly known as: TYLENOL Take 1,000 mg by mouth every 6 (six) hours as needed for mild pain, moderate pain or headache.   amoxicillin-clavulanate 875-125 MG tablet Commonly known as: Augmentin Take 1 tablet by mouth 2 (two) times daily for 6 days.   carbidopa-levodopa 10-100 MG tablet Commonly known as: SINEMET IR Take 1 tablet by mouth 3 (three) times daily for 3 days. Replaces: carbidopa-levodopa 25-100 MG tablet   carbidopa-levodopa 10-100 MG tablet Commonly known as: SINEMET IR Take 0.5 tablets by mouth 3 (three) times daily for 3 days. Start taking on: April 01, 2019   carbidopa-levodopa 10-100 MG tablet Commonly known as: SINEMET IR Take 0.5  tablets by mouth 2 (two) times daily. Start taking on: April 05, 2019   carbidopa-levodopa 10-100 MG tablet Commonly known as: SINEMET IR Take 0.5 tablets by mouth daily for 3 days. Start taking on: April 08, 2019   citalopram 20 MG tablet Commonly known as: CELEXA Take 10 mg by mouth daily.   clonazePAM 0.25 MG disintegrating tablet Commonly known as: KLONOPIN Take 1 tablet (0.25 mg total) by mouth 2 (two) times daily. Replaces: clonazePAM 0.5 MG tablet   donepezil 10 MG tablet Commonly known as: ARICEPT Take 5mg  (half-tab) at bedtime for 2 weeks, then increase 10mg  (1 tablet) at bedtime. What changed:  how much to take how to take this when to take this additional instructions   labetalol 100 MG tablet Commonly known as: NORMODYNE Take 1 tablet (100 mg total) by mouth 2 (two) times daily.   meclizine 25 MG tablet Commonly known as: ANTIVERT Take 1 tablet (25 mg total) by mouth 3 (three) times daily as needed for dizziness or nausea.   Melatonin 3 MG Tabs Take 3 mg by mouth at bedtime.   nitroGLYCERIN 0.4 MG/SPRAY spray Commonly known as: Nitrolingual Place 1 spray under the tongue every 5 (five) minutes x 3 doses as needed for chest pain.   ondansetron 4 MG tablet Commonly known as: Zofran Take 1 tablet (4 mg total) by mouth every 8 (eight) hours as needed for nausea or vomiting.   polyethylene glycol 17 g packet Commonly known as: MIRALAX / GLYCOLAX Take 17 g by mouth daily as needed for moderate constipation.   predniSONE 5 MG tablet Commonly known as: DELTASONE Take 1 tablet (5 mg total) by mouth daily.   Systane 0.4-0.3 % Soln Generic drug: Polyethyl Glycol-Propyl Glycol Place 1 drop into both eyes at bedtime.   vitamin B-12 1000 MCG tablet Commonly known as: CYANOCOBALAMIN Take 1,000 mcg by mouth at bedtime.   warfarin 5 MG tablet Commonly known as: COUMADIN TAKE 1 TABLET DAILY AT 6 PM. TAKE 1 TABLET DAILY AT 6 PM ON M,W,F TAKE 1/2 TAB ALL  OTHER DAYS or as directed What changed:  how much to take how to take this when to take this additional instructions   Zytiga 250 MG tablet Generic drug: abiraterone acetate TAKE 4 TABLETS BY MOUTH ONCE DAILY AS DIRECTED.  TAKE 1 HOUR BEFORE OR 2 HOURS AFTER A MEAL What changed: See the new instructions.               Durable Medical Equipment  (From admission, onward)           Start     Ordered   03/28/19 0000  For home use only DME Other see comment  Comments: [employer].  Question:  Length of Need  Answer:  Lifetime   03/28/19 0846   03/27/19 1334  For home use only DME Hospital bed  Once    Question Answer Comment  Length of Need Lifetime   The above medical condition requires: Patient requires the ability to reposition frequently   Head must be elevated greater than: 30 degrees   Bed type Semi-electric   Hoyer Lift Yes      03/27/19 1333             Allergies  Allergen Reactions   Penicillins Hives, Shortness Of Breath and Other (See Comments)    Has patient had a PCN reaction causing immediate rash, facial/tongue/throat swelling, SOB or lightheadedness with hypotension: Yes Has patient had a PCN reaction causing severe rash involving mucus membranes or skin necrosis: No Has patient had a PCN reaction that required hospitalization No Has patient had a PCN reaction occurring within the last 10 years: No If all of the above answers are "NO", then may proceed with Cephalosporin use.    Simvastatin Other (See Comments)    Reaction:  Muscle weakness    The results of significant diagnostics from this hospitalization (including imaging, microbiology, ancillary and laboratory) are listed below for reference.    Significant Diagnostic Studies: Ct Angio Head W Or Wo Contrast  Result Date: 03/24/2019 CLINICAL DATA:  Persistent central vertigo. Hypertension. EXAM: CT ANGIOGRAPHY HEAD AND NECK TECHNIQUE: Multidetector CT imaging of the head and neck was  performed using the standard protocol during bolus administration of intravenous contrast. Multiplanar CT image reconstructions and MIPs were obtained to evaluate the vascular anatomy. Carotid stenosis measurements (when applicable) are obtained utilizing NASCET criteria, using the distal internal carotid diameter as the denominator. CONTRAST:  124mL OMNIPAQUE IOHEXOL 350 MG/ML SOLN COMPARISON:  Head CT 03/23/2019 FINDINGS: CTA NECK FINDINGS SKELETON: There is no bony spinal canal stenosis. No lytic or blastic lesion. OTHER NECK: Normal pharynx, larynx and major salivary glands. No cervical lymphadenopathy. Unremarkable thyroid gland. UPPER CHEST: No pneumothorax or pleural effusion. No nodules or masses. AORTIC ARCH: There is moderate calcific atherosclerosis of the aortic arch. Ascending thoracic aorta measures up to 4.2 cm. Conventional 3 vessel aortic branching pattern. The visualized proximal subclavian arteries are widely patent. RIGHT CAROTID SYSTEM: --Common carotid artery: Widely patent origin without common carotid artery dissection or aneurysm. --Internal carotid artery: No dissection, occlusion or aneurysm. There is mixed density atherosclerosis extending into the proximal ICA, resulting in less than 50% stenosis. --External carotid artery: No acute abnormality. LEFT CAROTID SYSTEM: --Common carotid artery: Widely patent origin without common carotid artery dissection or aneurysm. --Internal carotid artery: No dissection, occlusion or aneurysm. Mild atherosclerotic calcification at the carotid bifurcation without hemodynamically significant stenosis. --External carotid artery: No acute abnormality. VERTEBRAL ARTERIES: Codominant configuration. Both origins are clearly patent. No dissection, occlusion or flow-limiting stenosis to the skull base (V1-V3 segments). CTA HEAD FINDINGS POSTERIOR CIRCULATION: --Vertebral arteries: Normal V4 segments. --Posterior inferior cerebellar arteries (PICA): Patent  origins from the vertebral arteries. --Anterior inferior cerebellar arteries (AICA): Patent origins from the basilar artery. --Basilar artery: Normal. --Superior cerebellar arteries: Normal. --Posterior cerebral arteries: Normal. Both originate from the basilar artery. Posterior communicating arteries (p-comm) are diminutive or absent. ANTERIOR CIRCULATION: --Intracranial internal carotid arteries: Atherosclerotic calcification of the internal carotid arteries at the skull base without hemodynamically significant stenosis. --Anterior cerebral arteries (ACA): Normal. Both A1 segments are present. Patent anterior communicating artery (a-comm). --Middle cerebral arteries (MCA): Normal. VENOUS  SINUSES: As permitted by contrast timing, patent. ANATOMIC VARIANTS: None Review of the MIP images confirms the above findings. IMPRESSION: 1. No emergent large vessel occlusion or high-grade stenosis of the intracranial arteries. 2. Bilateral carotid bifurcation atherosclerosis without hemodynamically significant stenosis by NASCET criteria. 3. Aneurysmal dilatation of the ascending thoracic aorta measuring up to 4.2 cm. Recommend semi-annual imaging followup by CTA or MRA and referral to cardiothoracic surgery if not already obtained. This recommendation follows 2010 ACCF/AHA/AATS/ACR/ASA/SCA/SCAI/SIR/STS/SVM Guidelines for the Diagnosis and Management of Patients With Thoracic Aortic Disease. Circulation. 2010; [id number]:4281962. Aortic aneurysm NOS (ICD10-I71.9) Aortic Atherosclerosis (ICD10-I70.0). Electronically Signed   By: Ulyses Jarred M.D.   On: 03/24/2019 02:21   Dg Chest 2 View  Result Date: 03/26/2019 CLINICAL DATA:  Weakness. EXAM: CHEST - 2 VIEW COMPARISON:  Chest x-ray dated March 23, 2019. FINDINGS: Unchanged left chest wall pacemaker. Stable cardiomediastinal silhouette status post CABG. Atherosclerotic calcification of the aortic arch. Normal pulmonary vascularity. No focal consolidation, pleural effusion, or  pneumothorax. No acute osseous abnormality. IMPRESSION: No active cardiopulmonary disease. Electronically Signed   By: Titus Dubin M.D.   On: 03/26/2019 09:37   Ct Head Wo Contrast  Result Date: 03/23/2019 CLINICAL DATA:  Muscle weakness EXAM: CT HEAD WITHOUT CONTRAST TECHNIQUE: Contiguous axial images were obtained from the base of the skull through the vertex without intravenous contrast. COMPARISON:  None. FINDINGS: Brain: There is atrophy and chronic small vessel disease changes. No acute intracranial abnormality. Specifically, no hemorrhage, hydrocephalus, mass lesion, acute infarction, or significant intracranial injury. Vascular: No hyperdense vessel or unexpected calcification. Skull: No acute calvarial abnormality. Sinuses/Orbits: Mucosal thickening in the ethmoid air cells. No acute findings Other: None IMPRESSION: Atrophy, chronic microvascular disease. No acute intracranial abnormality. Electronically Signed   By: Rolm Baptise M.D.   On: 03/23/2019 19:01   Ct Angio Neck W And/or Wo Contrast  Result Date: 03/24/2019 CLINICAL DATA:  Persistent central vertigo. Hypertension. EXAM: CT ANGIOGRAPHY HEAD AND NECK TECHNIQUE: Multidetector CT imaging of the head and neck was performed using the standard protocol during bolus administration of intravenous contrast. Multiplanar CT image reconstructions and MIPs were obtained to evaluate the vascular anatomy. Carotid stenosis measurements (when applicable) are obtained utilizing NASCET criteria, using the distal internal carotid diameter as the denominator. CONTRAST:  173mL OMNIPAQUE IOHEXOL 350 MG/ML SOLN COMPARISON:  Head CT 03/23/2019 FINDINGS: CTA NECK FINDINGS SKELETON: There is no bony spinal canal stenosis. No lytic or blastic lesion. OTHER NECK: Normal pharynx, larynx and major salivary glands. No cervical lymphadenopathy. Unremarkable thyroid gland. UPPER CHEST: No pneumothorax or pleural effusion. No nodules or masses. AORTIC ARCH: There is  moderate calcific atherosclerosis of the aortic arch. Ascending thoracic aorta measures up to 4.2 cm. Conventional 3 vessel aortic branching pattern. The visualized proximal subclavian arteries are widely patent. RIGHT CAROTID SYSTEM: --Common carotid artery: Widely patent origin without common carotid artery dissection or aneurysm. --Internal carotid artery: No dissection, occlusion or aneurysm. There is mixed density atherosclerosis extending into the proximal ICA, resulting in less than 50% stenosis. --External carotid artery: No acute abnormality. LEFT CAROTID SYSTEM: --Common carotid artery: Widely patent origin without common carotid artery dissection or aneurysm. --Internal carotid artery: No dissection, occlusion or aneurysm. Mild atherosclerotic calcification at the carotid bifurcation without hemodynamically significant stenosis. --External carotid artery: No acute abnormality. VERTEBRAL ARTERIES: Codominant configuration. Both origins are clearly patent. No dissection, occlusion or flow-limiting stenosis to the skull base (V1-V3 segments). CTA HEAD FINDINGS POSTERIOR CIRCULATION: --Vertebral arteries: Normal V4 segments. --Posterior  inferior cerebellar arteries (PICA): Patent origins from the vertebral arteries. --Anterior inferior cerebellar arteries (AICA): Patent origins from the basilar artery. --Basilar artery: Normal. --Superior cerebellar arteries: Normal. --Posterior cerebral arteries: Normal. Both originate from the basilar artery. Posterior communicating arteries (p-comm) are diminutive or absent. ANTERIOR CIRCULATION: --Intracranial internal carotid arteries: Atherosclerotic calcification of the internal carotid arteries at the skull base without hemodynamically significant stenosis. --Anterior cerebral arteries (ACA): Normal. Both A1 segments are present. Patent anterior communicating artery (a-comm). --Middle cerebral arteries (MCA): Normal. VENOUS SINUSES: As permitted by contrast timing,  patent. ANATOMIC VARIANTS: None Review of the MIP images confirms the above findings. IMPRESSION: 1. No emergent large vessel occlusion or high-grade stenosis of the intracranial arteries. 2. Bilateral carotid bifurcation atherosclerosis without hemodynamically significant stenosis by NASCET criteria. 3. Aneurysmal dilatation of the ascending thoracic aorta measuring up to 4.2 cm. Recommend semi-annual imaging followup by CTA or MRA and referral to cardiothoracic surgery if not already obtained. This recommendation follows 2010 ACCF/AHA/AATS/ACR/ASA/SCA/SCAI/SIR/STS/SVM Guidelines for the Diagnosis and Management of Patients With Thoracic Aortic Disease. Circulation. 2010; [id number]. Aortic aneurysm NOS (ICD10-I71.9) Aortic Atherosclerosis (ICD10-I70.0). Electronically Signed   By: Ulyses Jarred M.D.   On: 03/24/2019 02:21   Mr Brain Wo Contrast  Result Date: 03/24/2019 CLINICAL DATA:  85 year old male with dizziness, unsteady gait, hypertensive. EXAM: MRI HEAD WITHOUT CONTRAST TECHNIQUE: Multiplanar, multiecho pulse sequences of the brain and surrounding structures were obtained without intravenous contrast. COMPARISON:  CTA head and neck earlier today. Head CT 03/23/2019 and earlier. FINDINGS: Brain: No restricted diffusion to suggest acute infarction. No midline shift, mass effect, evidence of mass lesion, ventriculomegaly, extra-axial collection or acute intracranial hemorrhage. Cervicomedullary junction and pituitary are within normal limits. Generalized cerebral volume loss. No disproportionate areas of cerebral atrophy identified. Patchy and confluent bilateral cerebral white matter T2 and FLAIR hyperintensity. No cortical encephalomalacia. There are possibly a few scattered chronic microhemorrhages in the brain (inferior right cerebellum series 8, image 4). Small chronic lacunar infarct in the left caudate on series 6, image 16. Mild T2 heterogeneity in the right pons. Small chronic right superior  cerebellar infarcts suspected on series 9, image 30. Vascular: Major intracranial vascular flow voids are preserved. Skull and upper cervical spine: Negative visible cervical spine. Normal bone marrow signal. Sinuses/Orbits: Postoperative changes to both globes. Negative orbits. Mild paranasal sinus mucosal thickening. Other: Mastoids are clear. Visible internal auditory structures appear normal. Scalp and face soft tissues appear negative. IMPRESSION: 1.  No acute intracranial abnormality. 2. Signal changes compatible with chronic small vessel disease, moderate for age. Electronically Signed   By: Genevie Ann M.D.   On: 03/24/2019 15:37   Dg Chest Port 1 View  Result Date: 03/23/2019 CLINICAL DATA:  Hypertension EXAM: PORTABLE CHEST 1 VIEW COMPARISON:  05/23/2017 FINDINGS: Left pacer remains in place, unchanged. Prior CABG. Heart is normal size. Lungs clear. No effusions or acute bony abnormality. IMPRESSION: No active disease. Electronically Signed   By: Rolm Baptise M.D.   On: 03/23/2019 16:29   Dg Abd Portable 1v  Result Date: 03/27/2019 CLINICAL DATA:  Emesis. EXAM: PORTABLE ABDOMEN - 1 VIEW COMPARISON:  None. FINDINGS: The bowel gas pattern is normal. Phleboliths are noted in the pelvis. Status post surgical posterior fusion of lumbar spine. IMPRESSION: No evidence of bowel obstruction or ileus. Electronically Signed   By: Marijo Conception M.D.   On: 03/27/2019 16:06    Microbiology: Recent Results (from the past 240 hour(s))  SARS CORONAVIRUS 2 (TAT 6-12 HRS)  Nasal Swab Aptima Multi Swab     Status: None   Collection Time: 03/23/19  9:57 PM   Specimen: Aptima Multi Swab; Nasal Swab  Result Value Ref Range Status   SARS Coronavirus 2 NEGATIVE NEGATIVE Final    Comment: (NOTE) SARS-CoV-2 target nucleic acids are NOT DETECTED. The SARS-CoV-2 RNA is generally detectable in upper and lower respiratory specimens during the acute phase of infection. Negative results do not preclude SARS-CoV-2  infection, do not rule out co-infections with other pathogens, and should not be used as the sole basis for treatment or other patient management decisions. Negative results must be combined with clinical observations, patient history, and epidemiological information. The expected result is Negative. Fact Sheet for Patients: SugarRoll.be Fact Sheet for Healthcare Providers: https://www.woods-mathews.com/ This test is not yet approved or cleared by the [place] FDA and  has been authorized for detection and/or diagnosis of SARS-CoV-2 by FDA under an Emergency Use Authorization (EUA). This EUA will remain  in effect (meaning this test can be used) for the duration of the COVID-19 declaration under Section 56 4(b)(1) of the Act, 21 U.S.C. section 360bbb-3(b)(1), unless the authorization is terminated or revoked sooner. Performed at [facility], [place] [address] [place], [place] [address]   MRSA PCR Screening     Status: None   Collection Time: 03/24/19  5:50 AM   Specimen: Nasopharyngeal  Result Value Ref Range Status   MRSA by PCR NEGATIVE NEGATIVE Final    Comment:        The GeneXpert MRSA Assay (FDA approved for NASAL specimens only), is one component of a comprehensive MRSA colonization surveillance program. It is not intended to diagnose MRSA infection nor to guide or monitor treatment for MRSA infections. Performed at [facility], [place] [address], [place], [place] [address]   Culture, Urine     Status: Abnormal   Collection Time: 03/25/19  6:38 PM   Specimen: Urine, Random  Result Value Ref Range Status   Specimen Description URINE, RANDOM  Final   Special Requests   Final    NONE Performed at [facility], [place] [address] [place], [place] [address]    Culture 30,000 COLONIES/mL ENTEROCOCCUS FAECALIS (A)  Final   Report Status 03/28/2019 FINAL  Final   Organism ID, Bacteria ENTEROCOCCUS FAECALIS (A)   Final      Susceptibility   Enterococcus faecalis - MIC*    AMPICILLIN <=2 SENSITIVE Sensitive     LEVOFLOXACIN 1 SENSITIVE Sensitive     NITROFURANTOIN <=16 SENSITIVE Sensitive     VANCOMYCIN 2 SENSITIVE Sensitive     * 30,000 COLONIES/mL ENTEROCOCCUS FAECALIS  Culture, blood (routine x 2)     Status: None (Preliminary result)   Collection Time: 03/25/19  6:40 PM   Specimen: BLOOD RIGHT HAND  Result Value Ref Range Status   Specimen Description BLOOD RIGHT HAND  Final   Special Requests   Final    BOTTLES DRAWN AEROBIC AND ANAEROBIC Blood Culture adequate volume   Culture   Final    NO GROWTH 4 DAYS Performed at [facility], [address], [place], [place] [address]    Report Status PENDING  Incomplete  Culture, blood (routine x 2)     Status: None (Preliminary result)   Collection Time: 03/25/19  6:50 PM   Specimen: BLOOD LEFT HAND  Result Value Ref Range Status   Specimen Description BLOOD LEFT HAND  Final   Special Requests  Final    BOTTLES DRAWN AEROBIC ONLY Blood Culture adequate volume   Culture   Final    NO GROWTH 4 DAYS Performed at [facility], [place] [address], [place], [place] [address]    Report Status PENDING  Incomplete     Labs: Basic Metabolic Panel: Recent Labs  Lab 03/23/19 1545 03/26/19 0410 03/27/19 0547 03/28/19 0500 03/29/19 0505  NA 135 138 137 136 139  K 4.4 3.8 3.9 3.9 4.1  CL 102 105 106 105 109  CO2 25 24 21* 22 24  GLUCOSE 122* 102* 95 95 100*  BUN 17 26* 24* 23 24*  CREATININE 1.28* 1.35* 1.43* 1.29* 1.25*  CALCIUM 9.5 9.1 9.0 8.8* 9.0   Liver Function Tests: Recent Labs  Lab 03/23/19 1545 03/26/19 0410 03/27/19 0547 03/28/19 0500 03/29/19 0505  AST 16 12* 13* 12* 11*  ALT 6 6 7 5  <5  ALKPHOS 60 48 47 46 44  BILITOT 1.1 0.6 0.4 0.6 0.4  PROT 5.9* 4.8* 5.0* 5.1* 5.1*  ALBUMIN 3.3* 2.8* 2.8* 2.6* 2.7*   No results for input(s): LIPASE, AMYLASE in the last 168 hours. Recent Labs  Lab 03/25/19 1150    AMMONIA 16   CBC: Recent Labs  Lab 03/23/19 1545 03/26/19 0410 03/27/19 0547 03/28/19 0500 03/29/19 0505  WBC 10.0 8.2 7.6 6.5 6.2  HGB 11.0* 9.5* 9.6* 9.3* 9.6*  HCT 33.8* 29.8* 31.2* 29.3* 30.2*  MCV 93.6 94.6 97.8 96.7 94.4  PLT 168 162 157 146* 152   Cardiac Enzymes: Recent Labs  Lab 03/26/19 1141  CKTOTAL 57   BNP: BNP (last 3 results) No results for input(s): BNP in the last 8760 hours.  ProBNP (last 3 results) No results for input(s): PROBNP in the last 8760 hours.  CBG: No results for input(s): GLUCAP in the last 168 hours.  Principal Problem:   Vertigo Active Problems:   Essential hypertension, benign   OSA on CPAP   Paroxysmal atrial fibrillation (HCC)   CKD (chronic kidney disease), stage III (HCC)   Prostate cancer (HCC)   Supratherapeutic INR   Persistent vertigo of central origin   Time coordinating discharge: 38 minutes.  Signed:        Tamekia Rotter, DO [facility]  03/29/2019, 2:11 PM

## 2019-03-30 ENCOUNTER — Telehealth: Payer: Self-pay | Admitting: Internal Medicine

## 2019-03-30 LAB — CULTURE, BLOOD (ROUTINE X 2)
Culture: NO GROWTH
Culture: NO GROWTH
Special Requests: ADEQUATE
Special Requests: ADEQUATE

## 2019-03-30 NOTE — Telephone Encounter (Signed)
Spoke with patient's wife [name] regarding Palliative services and verbal consent rec'd.  [name] requested that I call her daughter [name] to schedule the visit, so that she could be available.  I then called [name] and I have scheduled a Telephone Palliative Consult for 03/31/19 @ 9 AM.

## 2019-03-31 ENCOUNTER — Other Ambulatory Visit: Payer: Self-pay

## 2019-03-31 ENCOUNTER — Encounter: Payer: Self-pay | Admitting: Oncology

## 2019-03-31 ENCOUNTER — Other Ambulatory Visit: Payer: Self-pay | Admitting: Internal Medicine

## 2019-03-31 DIAGNOSIS — Z515 Encounter for palliative care: Secondary | ICD-10-CM

## 2019-03-31 DIAGNOSIS — R1310 Dysphagia, unspecified: Secondary | ICD-10-CM | POA: Diagnosis not present

## 2019-03-31 DIAGNOSIS — D630 Anemia in neoplastic disease: Secondary | ICD-10-CM | POA: Diagnosis not present

## 2019-03-31 DIAGNOSIS — J9601 Acute respiratory failure with hypoxia: Secondary | ICD-10-CM | POA: Diagnosis not present

## 2019-03-31 DIAGNOSIS — G253 Myoclonus: Secondary | ICD-10-CM | POA: Diagnosis not present

## 2019-03-31 DIAGNOSIS — D631 Anemia in chronic kidney disease: Secondary | ICD-10-CM | POA: Diagnosis not present

## 2019-03-31 DIAGNOSIS — N136 Pyonephrosis: Secondary | ICD-10-CM | POA: Diagnosis not present

## 2019-03-31 DIAGNOSIS — N138 Other obstructive and reflux uropathy: Secondary | ICD-10-CM | POA: Diagnosis not present

## 2019-03-31 DIAGNOSIS — I5032 Chronic diastolic (congestive) heart failure: Secondary | ICD-10-CM | POA: Diagnosis not present

## 2019-03-31 DIAGNOSIS — F0280 Dementia in other diseases classified elsewhere without behavioral disturbance: Secondary | ICD-10-CM | POA: Diagnosis not present

## 2019-03-31 DIAGNOSIS — N183 Chronic kidney disease, stage 3 (moderate): Secondary | ICD-10-CM | POA: Diagnosis not present

## 2019-03-31 DIAGNOSIS — I480 Paroxysmal atrial fibrillation: Secondary | ICD-10-CM | POA: Diagnosis not present

## 2019-03-31 DIAGNOSIS — I495 Sick sinus syndrome: Secondary | ICD-10-CM | POA: Diagnosis not present

## 2019-03-31 DIAGNOSIS — L89151 Pressure ulcer of sacral region, stage 1: Secondary | ICD-10-CM | POA: Diagnosis not present

## 2019-03-31 DIAGNOSIS — G3183 Dementia with Lewy bodies: Secondary | ICD-10-CM | POA: Diagnosis not present

## 2019-03-31 DIAGNOSIS — E860 Dehydration: Secondary | ICD-10-CM | POA: Diagnosis not present

## 2019-03-31 DIAGNOSIS — F329 Major depressive disorder, single episode, unspecified: Secondary | ICD-10-CM | POA: Diagnosis not present

## 2019-03-31 DIAGNOSIS — G4733 Obstructive sleep apnea (adult) (pediatric): Secondary | ICD-10-CM | POA: Diagnosis not present

## 2019-03-31 DIAGNOSIS — I4519 Other right bundle-branch block: Secondary | ICD-10-CM | POA: Diagnosis not present

## 2019-03-31 DIAGNOSIS — S51002D Unspecified open wound of left elbow, subsequent encounter: Secondary | ICD-10-CM | POA: Diagnosis not present

## 2019-03-31 DIAGNOSIS — I130 Hypertensive heart and chronic kidney disease with heart failure and stage 1 through stage 4 chronic kidney disease, or unspecified chronic kidney disease: Secondary | ICD-10-CM | POA: Diagnosis not present

## 2019-03-31 DIAGNOSIS — D500 Iron deficiency anemia secondary to blood loss (chronic): Secondary | ICD-10-CM | POA: Diagnosis not present

## 2019-03-31 DIAGNOSIS — I2510 Atherosclerotic heart disease of native coronary artery without angina pectoris: Secondary | ICD-10-CM | POA: Diagnosis not present

## 2019-03-31 DIAGNOSIS — C61 Malignant neoplasm of prostate: Secondary | ICD-10-CM | POA: Diagnosis not present

## 2019-03-31 DIAGNOSIS — N179 Acute kidney failure, unspecified: Secondary | ICD-10-CM | POA: Diagnosis not present

## 2019-03-31 DIAGNOSIS — H814 Vertigo of central origin: Secondary | ICD-10-CM | POA: Diagnosis not present

## 2019-03-31 DIAGNOSIS — E43 Unspecified severe protein-calorie malnutrition: Secondary | ICD-10-CM | POA: Diagnosis not present

## 2019-03-31 DIAGNOSIS — D530 Protein deficiency anemia: Secondary | ICD-10-CM | POA: Diagnosis not present

## 2019-04-04 DIAGNOSIS — L89151 Pressure ulcer of sacral region, stage 1: Secondary | ICD-10-CM | POA: Diagnosis not present

## 2019-04-04 DIAGNOSIS — F0280 Dementia in other diseases classified elsewhere without behavioral disturbance: Secondary | ICD-10-CM | POA: Diagnosis not present

## 2019-04-04 DIAGNOSIS — N136 Pyonephrosis: Secondary | ICD-10-CM | POA: Diagnosis not present

## 2019-04-04 DIAGNOSIS — G3183 Dementia with Lewy bodies: Secondary | ICD-10-CM | POA: Diagnosis not present

## 2019-04-04 DIAGNOSIS — J9601 Acute respiratory failure with hypoxia: Secondary | ICD-10-CM | POA: Diagnosis not present

## 2019-04-04 DIAGNOSIS — R1310 Dysphagia, unspecified: Secondary | ICD-10-CM | POA: Diagnosis not present

## 2019-04-05 DIAGNOSIS — F0280 Dementia in other diseases classified elsewhere without behavioral disturbance: Secondary | ICD-10-CM | POA: Diagnosis not present

## 2019-04-05 DIAGNOSIS — R1310 Dysphagia, unspecified: Secondary | ICD-10-CM | POA: Diagnosis not present

## 2019-04-05 DIAGNOSIS — J9601 Acute respiratory failure with hypoxia: Secondary | ICD-10-CM | POA: Diagnosis not present

## 2019-04-05 DIAGNOSIS — G3183 Dementia with Lewy bodies: Secondary | ICD-10-CM | POA: Diagnosis not present

## 2019-04-05 DIAGNOSIS — N136 Pyonephrosis: Secondary | ICD-10-CM | POA: Diagnosis not present

## 2019-04-05 DIAGNOSIS — L89151 Pressure ulcer of sacral region, stage 1: Secondary | ICD-10-CM | POA: Diagnosis not present

## 2019-04-07 ENCOUNTER — Inpatient Hospital Stay: Payer: Medicare Other | Attending: Oncology | Admitting: Oncology

## 2019-04-07 ENCOUNTER — Other Ambulatory Visit: Payer: Self-pay | Admitting: Internal Medicine

## 2019-04-07 ENCOUNTER — Other Ambulatory Visit: Payer: Self-pay

## 2019-04-07 ENCOUNTER — Other Ambulatory Visit: Payer: Self-pay | Admitting: *Deleted

## 2019-04-07 DIAGNOSIS — Z515 Encounter for palliative care: Secondary | ICD-10-CM

## 2019-04-07 DIAGNOSIS — C61 Malignant neoplasm of prostate: Secondary | ICD-10-CM | POA: Diagnosis not present

## 2019-04-07 DIAGNOSIS — L89151 Pressure ulcer of sacral region, stage 1: Secondary | ICD-10-CM | POA: Diagnosis not present

## 2019-04-07 DIAGNOSIS — N136 Pyonephrosis: Secondary | ICD-10-CM | POA: Diagnosis not present

## 2019-04-07 DIAGNOSIS — J9601 Acute respiratory failure with hypoxia: Secondary | ICD-10-CM | POA: Diagnosis not present

## 2019-04-07 DIAGNOSIS — F0280 Dementia in other diseases classified elsewhere without behavioral disturbance: Secondary | ICD-10-CM | POA: Diagnosis not present

## 2019-04-07 DIAGNOSIS — R1310 Dysphagia, unspecified: Secondary | ICD-10-CM | POA: Diagnosis not present

## 2019-04-07 DIAGNOSIS — G3183 Dementia with Lewy bodies: Secondary | ICD-10-CM | POA: Diagnosis not present

## 2019-04-07 NOTE — Progress Notes (Signed)
[facility] OFFICE VISIT PROGRESS NOTE  I connected with Edward Woodward on 04/07/19 at  8:30 AM EDT by video/telephone and verified that I am speaking with the correct person using two identifiers.   I discussed the limitations, risks, security and privacy concerns of performing an evaluation and management service by telemedicine and the availability of in-person appointments. I also discussed with the patient that there may be a patient responsible charge related to this service. The patient expressed understanding and agreed to proceed.  Other persons participating in the visit and their role in the encounter: Wife, daughter  Patient's location: Home Provider's location: Office   Diagnosis: Prostate cancer, lymphoma, anemia, dementia  INTERVAL HISTORY: Edward Woodward is seen today for a telehealth visit secondary to his medical condition and the COVID pandemic.  The visit started as a video conference and converted to a telephone visit due to technical difficulty with Edward Woodward accessing the my chart visit.  Edward Woodward has a history of non-Hodgkin's lymphoma and prostate cancer.  He has multiple comorbid conditions.  He has not been seen at the Cancer center for the past 6 months secondary to the COVID pandemic. He was admitted on 03/23/2019 with nausea, vertigo, and inability to ambulate.  His blood pressure was elevated.  He had a negative neurologic evaluation for acute events.  He was discharged home 03/29/2019.  He is scheduled for a formal palliative care visit today. His wife and daughter report he is bedbound, but remains alert and is eating.  Edward Woodward was able to communicate with me over the phone.    Lab Results:  Lab Results  Component Value Date   WBC 6.2 03/29/2019   HGB 9.6 (L) 03/29/2019   HCT 30.2 (L) 03/29/2019   MCV 94.4 03/29/2019   PLT 152 03/29/2019   NEUTROABS 7.3 03/08/2019   PT/INR on 03/29/2019: 2.5   PSA on 03/26/2019: 32.3 Imaging:  No results found.  Medications: I have reviewed the patient's current medications.  Assessment/Plan: 1. Non-Hodgkin's lymphoma (follicular grade 3 lymphoma) - status post 6 cycles of Cytoxan/prednisone/rituximab 07/01/2007 through 10/21/2007.   Biopsy of a retroperitoneal mass 05/13/2016 consistent with involvement by low-grade non-Hodgkin's lymphoma  Initiation of weekly Rituxan 06/17/2016; week 4given 07/09/2016.  CT abdomen/pelvis 09/15/2016-improvement in retroperitoneal and pelvic soft tissue  Initiation of maintenance Rituxan every 3 months 10/13/2016 (2 year course planned)  CTs 08/25/2017-new left axillary lymph node, enlargement of left pleural effusion, less masslike soft tissue in the mid retroperitoneum, new areas of retroperitoneal and pelvic lymphadenopathy. Mildlyenlarged neck lymph nodes  Cycle 1 bendamustine 09/09/2017  Cycle 2 bendamustine 10/06/2017  Cycle 3 bendamustine 11/11/2017  CTs 12/02/2017-resolution of left axillary adenopathy, decreased celiac adenopathy, unchanged infiltrative retroperitoneal adenopathy, improvement in pelvic sidewall and right inguinal adenopathy 2. CT chest 04/01/2015 with no lymphadenopathy  CT abdomen/pelvis 05/01/2015 with mild progression of abdomen/pelvic lymphadenopathy 3. Prostate cancer - he has been treated with hormonal therapy, the PSA was stable on 10/15/2014  PSA increased 04/26/2015   Bone scan 05/09/2015 with unchanged increased activity in the thoracic and lumbar spine felt to most likely be degenerative.   Initiation of Abiraterone and prednisone 05/11/2015. Normalization of the PSA on Abiraterone. 4. History of a normocytic anemia secondary to non-Hodgkin's lymphoma, chemotherapy, and renal insufficiency - progressive secondary to GI bleeding, stable 5. History of Mild thrombocytopenia  6. History of bilateral hydronephrosis. Renal ultrasound 09/26/2014 consistent with medical  renal disease. No hydronephrosis. Ureter  stents removed 10/09/2016. 7. Coronary artery disease - status post coronary artery stent placement. 8. History of noncalcified lung nodules. 9. History of severe neutropenia July 2009 - likely related to delayed toxicity from rituximab. 10. Macular degeneration. 11. Right middle lobe density on a CT of the chest 06/18/2010 measuring 2.5 x 1.6 cm with mildly increased metabolic activity (SUV max 2.5) on a PET scan 06/27/2010. The area of increased density was less confluent and appeared smaller on a restaging CT 09/08/2010. Right middle lobe "scarring" with no new or suspicious pulmonary nodule on a CT 08/04/2011. 12. Mild increased metabolic activity associated with several lymph nodes on the PET scan 06/27/2010 - likely related to lymphoma. No lymphadenopathy in the mediastinum or axillary areas on the CT 08/04/2011. 13. Right hip discomfort-he received a steroid injection by his primary physician without improvement. He saw Dr. Collier Salina and there was a question of a lytic process in the right pelvis. A bone scan on 09/21/2012 revealed no evidence of metastatic disease. 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 Renal insufficiency-progressive CT 01/12/2016 confirmed progressive hydronephrosis  Placement of bilateral ureter stents 04/10/2016  Ureter stents removed 10/09/2016 15. Left leg DVT and pulmonary embolism 04/01/2015-on Coumadin 16. Admission 08/23/2015 with severe anemia and GI bleeding  Upper and lower endoscopy 08/26/2015 without a clear source for bleeding identified 17. Leg edema/anasarca-improved with leg wrapping and diuretics 18. Anemia secondary to chronic disease, renal failure, and possibly lymphoma 19. 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   Biopsy retroperitoneal soft tissue 05/13/2016 20. History of  elevated calcium-most likely secondary to non-Hodgkin's lymphoma status post pamidronate 06/08/2016, calcitonin 06/11/2016 21.  Dementia     Disposition: Edward Woodward has prostate cancer and multiple comorbid conditions.  The PSA is rising while on Zytiga/prednisone, but I doubt his symptoms are related to prostate cancer. He is currently bedbound.  He has been diagnosed with dementia.  I discussed the poor prognosis with his wife and daughter.  They agree to moving to a comfort care approach.  We decided to discontinue Zytiga/prednisone.  He will taper off of prednisone over the next few weeks.  He will also discontinue Coumadin anticoagulation.  Edward Woodward agrees to hospice care.  We will make a referral for home hospice care.  We will asked Dr. Felipa Eth to serve as the primary physician with hospice since the hospice diagnosis did not appear to be cancer related.  A follow-up appointment is not scheduled at the Cancer center.  I am available to see him in the future as needed.   I discussed the assessment and treatment plan with the patient. The patient was provided an opportunity to ask questions and all were answered. The patient agreed with the plan and demonstrated an understanding of the instructions.   The patient was advised to call back or seek an in-person evaluation if the symptoms worsen or if the condition fails to improve as anticipated.  I provided 25 minutes of telephone, chart review, and documentation time during this encounter, and > 50% was spent counseling as documented under my assessment & plan.  [name] ANP/GNP-BC   04/07/2019 8:35 AM

## 2019-04-07 NOTE — Progress Notes (Signed)
Called [facility] to inform them that the Fabio Asa has been discontinued. No AE regarding to drug--other cormobidities and being followed by Hospice now. Notified [facility] that patient/family are requesting he transfer from palliative care to full hospice and for Dr. Lajean Manes to manage his care.

## 2019-04-09 DIAGNOSIS — I110 Hypertensive heart disease with heart failure: Secondary | ICD-10-CM | POA: Diagnosis not present

## 2019-04-09 DIAGNOSIS — C61 Malignant neoplasm of prostate: Secondary | ICD-10-CM | POA: Diagnosis not present

## 2019-04-09 DIAGNOSIS — Z8572 Personal history of non-Hodgkin lymphomas: Secondary | ICD-10-CM | POA: Diagnosis not present

## 2019-04-09 DIAGNOSIS — Z950 Presence of cardiac pacemaker: Secondary | ICD-10-CM | POA: Diagnosis not present

## 2019-04-09 DIAGNOSIS — G3183 Dementia with Lewy bodies: Secondary | ICD-10-CM | POA: Diagnosis not present

## 2019-04-09 DIAGNOSIS — I480 Paroxysmal atrial fibrillation: Secondary | ICD-10-CM | POA: Diagnosis not present

## 2019-04-09 DIAGNOSIS — Z7401 Bed confinement status: Secondary | ICD-10-CM | POA: Diagnosis not present

## 2019-04-09 DIAGNOSIS — N390 Urinary tract infection, site not specified: Secondary | ICD-10-CM | POA: Diagnosis not present

## 2019-04-09 DIAGNOSIS — I509 Heart failure, unspecified: Secondary | ICD-10-CM | POA: Diagnosis not present

## 2019-04-09 DIAGNOSIS — F0280 Dementia in other diseases classified elsewhere without behavioral disturbance: Secondary | ICD-10-CM | POA: Diagnosis not present

## 2019-04-09 DIAGNOSIS — I25709 Atherosclerosis of coronary artery bypass graft(s), unspecified, with unspecified angina pectoris: Secondary | ICD-10-CM | POA: Diagnosis not present

## 2019-04-09 DIAGNOSIS — D630 Anemia in neoplastic disease: Secondary | ICD-10-CM | POA: Diagnosis not present

## 2019-04-09 DIAGNOSIS — L89151 Pressure ulcer of sacral region, stage 1: Secondary | ICD-10-CM | POA: Diagnosis not present

## 2019-04-09 NOTE — Progress Notes (Signed)
[facility] Consult Note Telephone: [phone]  Fax: [phone]  PATIENT NAME: Edward Woodward DOB: [birth year] MRN: [id number]  PRIMARY CARE PROVIDER:   Lajean Manes, MD  REFERRING PROVIDER:  Lajean Manes, MD [address] [place],  [place] [address]  RESPONSIBLE PARTYThayer Headings Kroenke(wife/POA) [phone] Daughter- [name]  RECOMMENDATIONS and PLAN:   Palliative Care Encounter Z51.5  1.  Advance Care Planing:  Re-addressed advanced directives.  Wife and daughter agree, after discussion with pt,  upon comfort measures, no IV fluids or tube feedings, determine use of antibiotics.  Code status will remain as DNAR/DNI.  No plan on return to hospital. DNAR and MOST forms completed and are posted on refrigerator.  Pt will no longer seek treatment of prostate cancer and oncologist has recommended transition to Hospice for comfort. Richarda Osmond, Warfarin and Prednisone has been discontinued.  Referral sent to [facility] by oncology. Discussion in reference to comfort care and questions answered.  Pending AV visit on 04/09/19.  2.  Memory Loss: No improvement of cognitive or functional abilities.   FAST score 7C. Rapid decline over past month. Continue Donepezil and Celexa.  Supportive care Pending transition to Hospice care at home.  3. Dysphagia: Unchanged.  Aspiration precautions.  HOB up with hospital bed.  4.  Debilitation:  Rapid decline. No expectation of gross cognitive or functional improvement.  Comfort care.    I spent 60 minutes providing this consultation,  from 1400 to 1500. More than 50% of the time in this consultation was spent coordinating communication with patient, wife and daughter.   HISTORY OF PRESENT ILLNESS: Follow-up with [name].  Wife and daughter report that he remains non-ambulatory, incontinent is sleeping more and normally does not recognize his family.  He does have episodes of  agitation normally in the evening hours and coughs occasionally after eating.  This is improved with use of Klonipin. He has now been referred to Hospice for comfort care.    CODE STATUS: DNAR/DNI  PPS: 30% Bebound  HOSPICE ELIGIBILITY/DIAGNOSIS: YES/advanced Parkinson disease with dementia and prostate cancer   PAST MEDICAL HISTORY:  Past Medical History:  Diagnosis Date   AMD (age-related macular degeneration), bilateral    Anemia    Anginal pain ([place])    pts wife states pt had to use nitroglycerin this am 04/09/2016   CAD (coronary artery disease)     NYHA Class 1B, CABG 1985   CHF (congestive heart failure) (HCC)    Collagen vascular disease ([place])    Collapsed lung    right    Complication of anesthesia    pts wife states pt gets confused    Degeneration of lumbar or lumbosacral intervertebral disc    Depressive disorder, not elsewhere classified    Dysrhythmia    GERD (gastroesophageal reflux disease)    History of blood transfusion    History of hiatal hernia    HTN (hypertension)    Hypercholesteremia    Lower leg edema    bilat   Multiple falls    Myocardial infarction ([place])    Neuromuscular disorder (HCC)    neuropathy   Neuropathy    Non Hodgkin's lymphoma (HCC)    OSA on CPAP    PE (pulmonary embolism) 03/2015   Presence of permanent cardiac pacemaker    Prostate cancer ([place])    Shortness of breath dyspnea    with exertion    Sinoatrial node dysfunction ([employer])  Ulcers of both lower legs (HCC)    history of    Urinary incontinence      PHYSICAL EXAM:   General: Chronically ill, frail male reclining in hospital bed.  In NAD EENT:  Dry mucus membranes Pulm:  Unlabored respirations.  Clear breath sounds.  CV:  S1S2.   RRR ABD:  Soft with active BS.  Non-tender Neurological: Somnolent but arouses to verbal stimuli. Oriented to person only. Speaks with few words.  Weak but follows commands.  Gonzella Lex, NP-C

## 2019-04-09 NOTE — Progress Notes (Signed)
[facility] Consult Note Telephone: [phone]  Fax: [phone]  PATIENT NAME: Edward Woodward DOB: October 06, 1926 MRN: [id number]  PRIMARY CARE PROVIDER:   Lajean Manes, MD  REFERRING PROVIDER:  Lajean Manes, MD [address] [place],  [place] [address]  RESPONSIBLE PARTYThayer Headings Woodward(wife/POA) [phone]  RECOMMENDATIONS and PLAN:   Palliative Care Encounter Z51.5  1.  Advance Care Planing:  Reviewed Palliative vs Hospice Care and goals of care.  Current goals are to continue home PT to improve strength.  Unsure if treatment of prostate cancer will continue due to increases of PSA. Discussed advanced directives with wife and daughter, Edward Woodward.  Code status is being changed to DNAR/DNI.  Remainder of selections of MOST form will be discussed and will be re-assessed during next visit in 1 week.    2.  Memory Loss:  Related to Parkinsons with dementia and previous CVA.  FAST score 7C. Rapid decline over past month. Continue Donepezil.  Supportive care by wife and daughter.  Consider transition to Hospice as overall health declines.  3. Dysphagia:  Aspiration precautions.  HOB up with eating and drinking. Meal modification to soft comfort foods.    4.  Debilitation:  Rapid decline and related to advanced Parkinsons with dementia and prostate cancer. Re-evaluate during next visit.  Continue f/u with oncology.  Due to the COVID-19 crisis, this visit occurred telephoncally and was initiated and consented by patient and or family.    I spent 60 minutes providing this consultation,  from 0900 to 1000. More than 50% of the time in this consultation was spent coordinating communication.   HISTORY OF PRESENT ILLNESS:  Edward Woodward is a 85 y.o. year old male with multiple medical problems including prostate cancer, vascular dementia, previous CVA, CAD with stenting and CHF.  He was admitted to the hospital from 8/27- 03/29/19  due to weakness, inability to ambulate and hypertensive. Prior to hospitalization, he was able to ambulate with a shuffled gait and use of rollator.  He also was able to get in and out of bed unassisted.  Since discharge, he is bed bound, incontinent of B&B, has paranoia with visual hallucinations, is only able to feed self with finger foods and does have some difficulty with swallowing. He has been taking [place] for treatment of prostate CA however PSA has been increasing with current level of 32.   Palliative Care was asked to help address goals of care.   CODE STATUS: DNAR/DNI  PPS: 30% Bebound HOSPICE ELIGIBILITY/DIAGNOSIS: YES/advanced Parkinson disease with dementia and prostate cancer (Currently taking anti cancer med)  PAST MEDICAL HISTORY:  Past Medical History:  Diagnosis Date  . AMD (age-related macular degeneration), bilateral   . Anemia   . Anginal pain ([place])    pts wife states pt had to use nitroglycerin this am 04/09/2016  . CAD (coronary artery disease)     NYHA Class 1B, CABG 1985  . CHF (congestive heart failure) ([place])   . Collagen vascular disease ([place])   . Collapsed lung    right   . Complication of anesthesia    pts wife states pt gets confused   . Degeneration of lumbar or lumbosacral intervertebral disc   . Depressive disorder, not elsewhere classified   . Dysrhythmia   . GERD (gastroesophageal reflux disease)   . History of blood transfusion   . History of hiatal hernia   . HTN (hypertension)   . Hypercholesteremia   .  Lower leg edema    bilat  . Multiple falls   . Myocardial infarction ([place])   . Neuromuscular disorder (HCC)    neuropathy  . Neuropathy   . Non Hodgkin's lymphoma ([place])   . OSA on CPAP   . PE (pulmonary embolism) 03/2015  . Presence of permanent cardiac pacemaker   . Prostate cancer ([place])   . Shortness of breath dyspnea    with exertion   . Sinoatrial node dysfunction (HCC)   . Ulcers of both lower legs (HCC)    history of   .  Urinary incontinence      PHYSICAL EXAM:   General: NAD Neurological: Alert with few spoken words. Unable to complete physical examination due to telephonic visit.  Edward Lex, NP-C

## 2019-04-10 DIAGNOSIS — C61 Malignant neoplasm of prostate: Secondary | ICD-10-CM | POA: Diagnosis not present

## 2019-04-10 DIAGNOSIS — I110 Hypertensive heart disease with heart failure: Secondary | ICD-10-CM | POA: Diagnosis not present

## 2019-04-10 DIAGNOSIS — D630 Anemia in neoplastic disease: Secondary | ICD-10-CM | POA: Diagnosis not present

## 2019-04-10 DIAGNOSIS — I25709 Atherosclerosis of coronary artery bypass graft(s), unspecified, with unspecified angina pectoris: Secondary | ICD-10-CM | POA: Diagnosis not present

## 2019-04-10 DIAGNOSIS — G301 Alzheimer's disease with late onset: Secondary | ICD-10-CM | POA: Diagnosis not present

## 2019-04-10 DIAGNOSIS — I509 Heart failure, unspecified: Secondary | ICD-10-CM | POA: Diagnosis not present

## 2019-04-10 DIAGNOSIS — G20 Parkinson's disease: Secondary | ICD-10-CM | POA: Diagnosis not present

## 2019-04-10 DIAGNOSIS — I480 Paroxysmal atrial fibrillation: Secondary | ICD-10-CM | POA: Diagnosis not present

## 2019-04-14 DIAGNOSIS — I480 Paroxysmal atrial fibrillation: Secondary | ICD-10-CM | POA: Diagnosis not present

## 2019-04-14 DIAGNOSIS — I509 Heart failure, unspecified: Secondary | ICD-10-CM | POA: Diagnosis not present

## 2019-04-14 DIAGNOSIS — I110 Hypertensive heart disease with heart failure: Secondary | ICD-10-CM | POA: Diagnosis not present

## 2019-04-14 DIAGNOSIS — D630 Anemia in neoplastic disease: Secondary | ICD-10-CM | POA: Diagnosis not present

## 2019-04-14 DIAGNOSIS — C61 Malignant neoplasm of prostate: Secondary | ICD-10-CM | POA: Diagnosis not present

## 2019-04-14 DIAGNOSIS — I25709 Atherosclerosis of coronary artery bypass graft(s), unspecified, with unspecified angina pectoris: Secondary | ICD-10-CM | POA: Diagnosis not present

## 2019-04-17 DIAGNOSIS — I509 Heart failure, unspecified: Secondary | ICD-10-CM | POA: Diagnosis not present

## 2019-04-17 DIAGNOSIS — D630 Anemia in neoplastic disease: Secondary | ICD-10-CM | POA: Diagnosis not present

## 2019-04-17 DIAGNOSIS — I110 Hypertensive heart disease with heart failure: Secondary | ICD-10-CM | POA: Diagnosis not present

## 2019-04-17 DIAGNOSIS — I480 Paroxysmal atrial fibrillation: Secondary | ICD-10-CM | POA: Diagnosis not present

## 2019-04-17 DIAGNOSIS — C61 Malignant neoplasm of prostate: Secondary | ICD-10-CM | POA: Diagnosis not present

## 2019-04-17 DIAGNOSIS — I25709 Atherosclerosis of coronary artery bypass graft(s), unspecified, with unspecified angina pectoris: Secondary | ICD-10-CM | POA: Diagnosis not present

## 2019-04-21 DIAGNOSIS — F0280 Dementia in other diseases classified elsewhere without behavioral disturbance: Secondary | ICD-10-CM | POA: Diagnosis not present

## 2019-04-21 DIAGNOSIS — I25709 Atherosclerosis of coronary artery bypass graft(s), unspecified, with unspecified angina pectoris: Secondary | ICD-10-CM | POA: Diagnosis not present

## 2019-04-21 DIAGNOSIS — N183 Chronic kidney disease, stage 3 (moderate): Secondary | ICD-10-CM | POA: Diagnosis not present

## 2019-04-21 DIAGNOSIS — G301 Alzheimer's disease with late onset: Secondary | ICD-10-CM | POA: Diagnosis not present

## 2019-04-21 DIAGNOSIS — E7800 Pure hypercholesterolemia, unspecified: Secondary | ICD-10-CM | POA: Diagnosis not present

## 2019-04-21 DIAGNOSIS — F321 Major depressive disorder, single episode, moderate: Secondary | ICD-10-CM | POA: Diagnosis not present

## 2019-04-21 DIAGNOSIS — I5032 Chronic diastolic (congestive) heart failure: Secondary | ICD-10-CM | POA: Diagnosis not present

## 2019-04-21 DIAGNOSIS — I480 Paroxysmal atrial fibrillation: Secondary | ICD-10-CM | POA: Diagnosis not present

## 2019-04-21 DIAGNOSIS — I110 Hypertensive heart disease with heart failure: Secondary | ICD-10-CM | POA: Diagnosis not present

## 2019-04-21 DIAGNOSIS — C61 Malignant neoplasm of prostate: Secondary | ICD-10-CM | POA: Diagnosis not present

## 2019-04-21 DIAGNOSIS — I509 Heart failure, unspecified: Secondary | ICD-10-CM | POA: Diagnosis not present

## 2019-04-21 DIAGNOSIS — D630 Anemia in neoplastic disease: Secondary | ICD-10-CM | POA: Diagnosis not present

## 2019-04-21 DIAGNOSIS — I129 Hypertensive chronic kidney disease with stage 1 through stage 4 chronic kidney disease, or unspecified chronic kidney disease: Secondary | ICD-10-CM | POA: Diagnosis not present

## 2019-04-23 ENCOUNTER — Other Ambulatory Visit: Payer: Self-pay | Admitting: Nurse Practitioner

## 2019-04-23 DIAGNOSIS — Z7901 Long term (current) use of anticoagulants: Secondary | ICD-10-CM

## 2019-04-24 DIAGNOSIS — C61 Malignant neoplasm of prostate: Secondary | ICD-10-CM | POA: Diagnosis not present

## 2019-04-24 DIAGNOSIS — I509 Heart failure, unspecified: Secondary | ICD-10-CM | POA: Diagnosis not present

## 2019-04-24 DIAGNOSIS — I110 Hypertensive heart disease with heart failure: Secondary | ICD-10-CM | POA: Diagnosis not present

## 2019-04-24 DIAGNOSIS — D630 Anemia in neoplastic disease: Secondary | ICD-10-CM | POA: Diagnosis not present

## 2019-04-24 DIAGNOSIS — I480 Paroxysmal atrial fibrillation: Secondary | ICD-10-CM | POA: Diagnosis not present

## 2019-04-24 DIAGNOSIS — I25709 Atherosclerosis of coronary artery bypass graft(s), unspecified, with unspecified angina pectoris: Secondary | ICD-10-CM | POA: Diagnosis not present

## 2019-04-27 DIAGNOSIS — I509 Heart failure, unspecified: Secondary | ICD-10-CM | POA: Diagnosis not present

## 2019-04-27 DIAGNOSIS — L89151 Pressure ulcer of sacral region, stage 1: Secondary | ICD-10-CM | POA: Diagnosis not present

## 2019-04-27 DIAGNOSIS — C61 Malignant neoplasm of prostate: Secondary | ICD-10-CM | POA: Diagnosis not present

## 2019-04-27 DIAGNOSIS — G3183 Dementia with Lewy bodies: Secondary | ICD-10-CM | POA: Diagnosis not present

## 2019-04-27 DIAGNOSIS — N390 Urinary tract infection, site not specified: Secondary | ICD-10-CM | POA: Diagnosis not present

## 2019-04-27 DIAGNOSIS — Z950 Presence of cardiac pacemaker: Secondary | ICD-10-CM | POA: Diagnosis not present

## 2019-04-27 DIAGNOSIS — I480 Paroxysmal atrial fibrillation: Secondary | ICD-10-CM | POA: Diagnosis not present

## 2019-04-27 DIAGNOSIS — I110 Hypertensive heart disease with heart failure: Secondary | ICD-10-CM | POA: Diagnosis not present

## 2019-04-27 DIAGNOSIS — I25709 Atherosclerosis of coronary artery bypass graft(s), unspecified, with unspecified angina pectoris: Secondary | ICD-10-CM | POA: Diagnosis not present

## 2019-04-27 DIAGNOSIS — D630 Anemia in neoplastic disease: Secondary | ICD-10-CM | POA: Diagnosis not present

## 2019-04-27 DIAGNOSIS — Z8572 Personal history of non-Hodgkin lymphomas: Secondary | ICD-10-CM | POA: Diagnosis not present

## 2019-04-27 DIAGNOSIS — Z7401 Bed confinement status: Secondary | ICD-10-CM | POA: Diagnosis not present

## 2019-04-27 DIAGNOSIS — F0280 Dementia in other diseases classified elsewhere without behavioral disturbance: Secondary | ICD-10-CM | POA: Diagnosis not present

## 2019-04-28 DIAGNOSIS — C61 Malignant neoplasm of prostate: Secondary | ICD-10-CM | POA: Diagnosis not present

## 2019-04-28 DIAGNOSIS — I110 Hypertensive heart disease with heart failure: Secondary | ICD-10-CM | POA: Diagnosis not present

## 2019-04-28 DIAGNOSIS — I509 Heart failure, unspecified: Secondary | ICD-10-CM | POA: Diagnosis not present

## 2019-04-28 DIAGNOSIS — D630 Anemia in neoplastic disease: Secondary | ICD-10-CM | POA: Diagnosis not present

## 2019-04-28 DIAGNOSIS — I480 Paroxysmal atrial fibrillation: Secondary | ICD-10-CM | POA: Diagnosis not present

## 2019-04-28 DIAGNOSIS — I25709 Atherosclerosis of coronary artery bypass graft(s), unspecified, with unspecified angina pectoris: Secondary | ICD-10-CM | POA: Diagnosis not present

## 2019-04-30 DIAGNOSIS — I480 Paroxysmal atrial fibrillation: Secondary | ICD-10-CM | POA: Diagnosis not present

## 2019-04-30 DIAGNOSIS — I509 Heart failure, unspecified: Secondary | ICD-10-CM | POA: Diagnosis not present

## 2019-04-30 DIAGNOSIS — C61 Malignant neoplasm of prostate: Secondary | ICD-10-CM | POA: Diagnosis not present

## 2019-04-30 DIAGNOSIS — I25709 Atherosclerosis of coronary artery bypass graft(s), unspecified, with unspecified angina pectoris: Secondary | ICD-10-CM | POA: Diagnosis not present

## 2019-04-30 DIAGNOSIS — I110 Hypertensive heart disease with heart failure: Secondary | ICD-10-CM | POA: Diagnosis not present

## 2019-04-30 DIAGNOSIS — D630 Anemia in neoplastic disease: Secondary | ICD-10-CM | POA: Diagnosis not present

## 2019-05-01 DIAGNOSIS — I480 Paroxysmal atrial fibrillation: Secondary | ICD-10-CM | POA: Diagnosis not present

## 2019-05-01 DIAGNOSIS — D630 Anemia in neoplastic disease: Secondary | ICD-10-CM | POA: Diagnosis not present

## 2019-05-01 DIAGNOSIS — C61 Malignant neoplasm of prostate: Secondary | ICD-10-CM | POA: Diagnosis not present

## 2019-05-01 DIAGNOSIS — I110 Hypertensive heart disease with heart failure: Secondary | ICD-10-CM | POA: Diagnosis not present

## 2019-05-01 DIAGNOSIS — I25709 Atherosclerosis of coronary artery bypass graft(s), unspecified, with unspecified angina pectoris: Secondary | ICD-10-CM | POA: Diagnosis not present

## 2019-05-01 DIAGNOSIS — I509 Heart failure, unspecified: Secondary | ICD-10-CM | POA: Diagnosis not present

## 2019-05-03 DIAGNOSIS — C61 Malignant neoplasm of prostate: Secondary | ICD-10-CM | POA: Diagnosis not present

## 2019-05-03 DIAGNOSIS — I480 Paroxysmal atrial fibrillation: Secondary | ICD-10-CM | POA: Diagnosis not present

## 2019-05-03 DIAGNOSIS — I25709 Atherosclerosis of coronary artery bypass graft(s), unspecified, with unspecified angina pectoris: Secondary | ICD-10-CM | POA: Diagnosis not present

## 2019-05-03 DIAGNOSIS — I110 Hypertensive heart disease with heart failure: Secondary | ICD-10-CM | POA: Diagnosis not present

## 2019-05-03 DIAGNOSIS — I509 Heart failure, unspecified: Secondary | ICD-10-CM | POA: Diagnosis not present

## 2019-05-03 DIAGNOSIS — D630 Anemia in neoplastic disease: Secondary | ICD-10-CM | POA: Diagnosis not present

## 2019-05-04 DIAGNOSIS — I480 Paroxysmal atrial fibrillation: Secondary | ICD-10-CM | POA: Diagnosis not present

## 2019-05-04 DIAGNOSIS — D630 Anemia in neoplastic disease: Secondary | ICD-10-CM | POA: Diagnosis not present

## 2019-05-04 DIAGNOSIS — I25709 Atherosclerosis of coronary artery bypass graft(s), unspecified, with unspecified angina pectoris: Secondary | ICD-10-CM | POA: Diagnosis not present

## 2019-05-04 DIAGNOSIS — I509 Heart failure, unspecified: Secondary | ICD-10-CM | POA: Diagnosis not present

## 2019-05-04 DIAGNOSIS — C61 Malignant neoplasm of prostate: Secondary | ICD-10-CM | POA: Diagnosis not present

## 2019-05-04 DIAGNOSIS — I110 Hypertensive heart disease with heart failure: Secondary | ICD-10-CM | POA: Diagnosis not present

## 2019-05-05 DIAGNOSIS — I509 Heart failure, unspecified: Secondary | ICD-10-CM | POA: Diagnosis not present

## 2019-05-05 DIAGNOSIS — D630 Anemia in neoplastic disease: Secondary | ICD-10-CM | POA: Diagnosis not present

## 2019-05-05 DIAGNOSIS — I25709 Atherosclerosis of coronary artery bypass graft(s), unspecified, with unspecified angina pectoris: Secondary | ICD-10-CM | POA: Diagnosis not present

## 2019-05-05 DIAGNOSIS — I480 Paroxysmal atrial fibrillation: Secondary | ICD-10-CM | POA: Diagnosis not present

## 2019-05-05 DIAGNOSIS — I110 Hypertensive heart disease with heart failure: Secondary | ICD-10-CM | POA: Diagnosis not present

## 2019-05-05 DIAGNOSIS — C61 Malignant neoplasm of prostate: Secondary | ICD-10-CM | POA: Diagnosis not present

## 2019-05-08 DIAGNOSIS — C61 Malignant neoplasm of prostate: Secondary | ICD-10-CM | POA: Diagnosis not present

## 2019-05-08 DIAGNOSIS — D630 Anemia in neoplastic disease: Secondary | ICD-10-CM | POA: Diagnosis not present

## 2019-05-08 DIAGNOSIS — I110 Hypertensive heart disease with heart failure: Secondary | ICD-10-CM | POA: Diagnosis not present

## 2019-05-08 DIAGNOSIS — I509 Heart failure, unspecified: Secondary | ICD-10-CM | POA: Diagnosis not present

## 2019-05-08 DIAGNOSIS — I480 Paroxysmal atrial fibrillation: Secondary | ICD-10-CM | POA: Diagnosis not present

## 2019-05-08 DIAGNOSIS — I25709 Atherosclerosis of coronary artery bypass graft(s), unspecified, with unspecified angina pectoris: Secondary | ICD-10-CM | POA: Diagnosis not present

## 2019-05-09 ENCOUNTER — Ambulatory Visit (INDEPENDENT_AMBULATORY_CARE_PROVIDER_SITE_OTHER): Payer: Medicare Other | Admitting: *Deleted

## 2019-05-09 DIAGNOSIS — I25709 Atherosclerosis of coronary artery bypass graft(s), unspecified, with unspecified angina pectoris: Secondary | ICD-10-CM | POA: Diagnosis not present

## 2019-05-09 DIAGNOSIS — I495 Sick sinus syndrome: Secondary | ICD-10-CM | POA: Diagnosis not present

## 2019-05-09 DIAGNOSIS — C61 Malignant neoplasm of prostate: Secondary | ICD-10-CM | POA: Diagnosis not present

## 2019-05-09 DIAGNOSIS — I509 Heart failure, unspecified: Secondary | ICD-10-CM | POA: Diagnosis not present

## 2019-05-09 DIAGNOSIS — I480 Paroxysmal atrial fibrillation: Secondary | ICD-10-CM | POA: Diagnosis not present

## 2019-05-09 DIAGNOSIS — I110 Hypertensive heart disease with heart failure: Secondary | ICD-10-CM | POA: Diagnosis not present

## 2019-05-09 DIAGNOSIS — D630 Anemia in neoplastic disease: Secondary | ICD-10-CM | POA: Diagnosis not present

## 2019-05-10 LAB — CUP PACEART REMOTE DEVICE CHECK
Battery Remaining Longevity: 84 mo
Battery Remaining Percentage: 100 %
Brady Statistic RA Percent Paced: 96 %
Brady Statistic RV Percent Paced: 3 %
Date Time Interrogation Session: 20201013043100
Implantable Lead Implant Date: 20081013
Implantable Lead Implant Date: 20081013
Implantable Lead Location: 753859
Implantable Lead Location: 753860
Implantable Lead Model: 4457
Implantable Lead Model: 4470
Implantable Lead Serial Number: 205344
Implantable Lead Serial Number: 210110
Implantable Pulse Generator Implant Date: 20181214
Lead Channel Impedance Value: 448 Ohm
Lead Channel Impedance Value: 530 Ohm
Lead Channel Pacing Threshold Amplitude: 0.5 V
Lead Channel Pacing Threshold Amplitude: 2 V
Lead Channel Pacing Threshold Pulse Width: 0.4 ms
Lead Channel Pacing Threshold Pulse Width: 0.4 ms
Lead Channel Setting Pacing Amplitude: 2 V
Lead Channel Setting Pacing Amplitude: 3 V
Lead Channel Setting Pacing Pulse Width: 0.4 ms
Lead Channel Setting Sensing Sensitivity: 2.5 mV
Pulse Gen Serial Number: 377798

## 2019-05-12 DIAGNOSIS — I25709 Atherosclerosis of coronary artery bypass graft(s), unspecified, with unspecified angina pectoris: Secondary | ICD-10-CM | POA: Diagnosis not present

## 2019-05-12 DIAGNOSIS — I110 Hypertensive heart disease with heart failure: Secondary | ICD-10-CM | POA: Diagnosis not present

## 2019-05-12 DIAGNOSIS — D630 Anemia in neoplastic disease: Secondary | ICD-10-CM | POA: Diagnosis not present

## 2019-05-12 DIAGNOSIS — I480 Paroxysmal atrial fibrillation: Secondary | ICD-10-CM | POA: Diagnosis not present

## 2019-05-12 DIAGNOSIS — I509 Heart failure, unspecified: Secondary | ICD-10-CM | POA: Diagnosis not present

## 2019-05-12 DIAGNOSIS — C61 Malignant neoplasm of prostate: Secondary | ICD-10-CM | POA: Diagnosis not present

## 2019-05-15 DIAGNOSIS — D630 Anemia in neoplastic disease: Secondary | ICD-10-CM | POA: Diagnosis not present

## 2019-05-15 DIAGNOSIS — I480 Paroxysmal atrial fibrillation: Secondary | ICD-10-CM | POA: Diagnosis not present

## 2019-05-15 DIAGNOSIS — I110 Hypertensive heart disease with heart failure: Secondary | ICD-10-CM | POA: Diagnosis not present

## 2019-05-15 DIAGNOSIS — I25709 Atherosclerosis of coronary artery bypass graft(s), unspecified, with unspecified angina pectoris: Secondary | ICD-10-CM | POA: Diagnosis not present

## 2019-05-15 DIAGNOSIS — I509 Heart failure, unspecified: Secondary | ICD-10-CM | POA: Diagnosis not present

## 2019-05-15 DIAGNOSIS — C61 Malignant neoplasm of prostate: Secondary | ICD-10-CM | POA: Diagnosis not present

## 2019-05-16 DIAGNOSIS — I110 Hypertensive heart disease with heart failure: Secondary | ICD-10-CM | POA: Diagnosis not present

## 2019-05-16 DIAGNOSIS — D630 Anemia in neoplastic disease: Secondary | ICD-10-CM | POA: Diagnosis not present

## 2019-05-16 DIAGNOSIS — I509 Heart failure, unspecified: Secondary | ICD-10-CM | POA: Diagnosis not present

## 2019-05-16 DIAGNOSIS — C61 Malignant neoplasm of prostate: Secondary | ICD-10-CM | POA: Diagnosis not present

## 2019-05-16 DIAGNOSIS — I25709 Atherosclerosis of coronary artery bypass graft(s), unspecified, with unspecified angina pectoris: Secondary | ICD-10-CM | POA: Diagnosis not present

## 2019-05-16 DIAGNOSIS — I480 Paroxysmal atrial fibrillation: Secondary | ICD-10-CM | POA: Diagnosis not present

## 2019-05-17 DIAGNOSIS — C61 Malignant neoplasm of prostate: Secondary | ICD-10-CM | POA: Diagnosis not present

## 2019-05-17 DIAGNOSIS — D630 Anemia in neoplastic disease: Secondary | ICD-10-CM | POA: Diagnosis not present

## 2019-05-17 DIAGNOSIS — I110 Hypertensive heart disease with heart failure: Secondary | ICD-10-CM | POA: Diagnosis not present

## 2019-05-17 DIAGNOSIS — I480 Paroxysmal atrial fibrillation: Secondary | ICD-10-CM | POA: Diagnosis not present

## 2019-05-17 DIAGNOSIS — I25709 Atherosclerosis of coronary artery bypass graft(s), unspecified, with unspecified angina pectoris: Secondary | ICD-10-CM | POA: Diagnosis not present

## 2019-05-17 DIAGNOSIS — I509 Heart failure, unspecified: Secondary | ICD-10-CM | POA: Diagnosis not present

## 2019-05-18 DIAGNOSIS — I110 Hypertensive heart disease with heart failure: Secondary | ICD-10-CM | POA: Diagnosis not present

## 2019-05-18 DIAGNOSIS — I480 Paroxysmal atrial fibrillation: Secondary | ICD-10-CM | POA: Diagnosis not present

## 2019-05-18 DIAGNOSIS — I25709 Atherosclerosis of coronary artery bypass graft(s), unspecified, with unspecified angina pectoris: Secondary | ICD-10-CM | POA: Diagnosis not present

## 2019-05-18 DIAGNOSIS — C61 Malignant neoplasm of prostate: Secondary | ICD-10-CM | POA: Diagnosis not present

## 2019-05-18 DIAGNOSIS — I509 Heart failure, unspecified: Secondary | ICD-10-CM | POA: Diagnosis not present

## 2019-05-18 DIAGNOSIS — D630 Anemia in neoplastic disease: Secondary | ICD-10-CM | POA: Diagnosis not present

## 2019-05-19 NOTE — Progress Notes (Signed)
Remote pacemaker transmission.   

## 2019-05-28 DEATH — deceased

## 2019-06-08 ENCOUNTER — Telehealth: Payer: Self-pay

## 2019-06-08 NOTE — Telephone Encounter (Signed)
Pt wife called stating the pt is deceased and wanted to know what to do with his monitor. I told her the [employer] do not take there monitors back and she can throw it away.

## 2019-08-07 ENCOUNTER — Ambulatory Visit: Payer: Medicare Other | Admitting: Neurology

## 2020-09-17 IMAGING — CR CHEST - 2 VIEW
2 series · 2 of 2 positions shown · non-contrast
Comparison: Chest x-ray dated March 23, 2019.

CLINICAL DATA: Weakness.

EXAM:
CHEST - 2 VIEW

[chest lat]
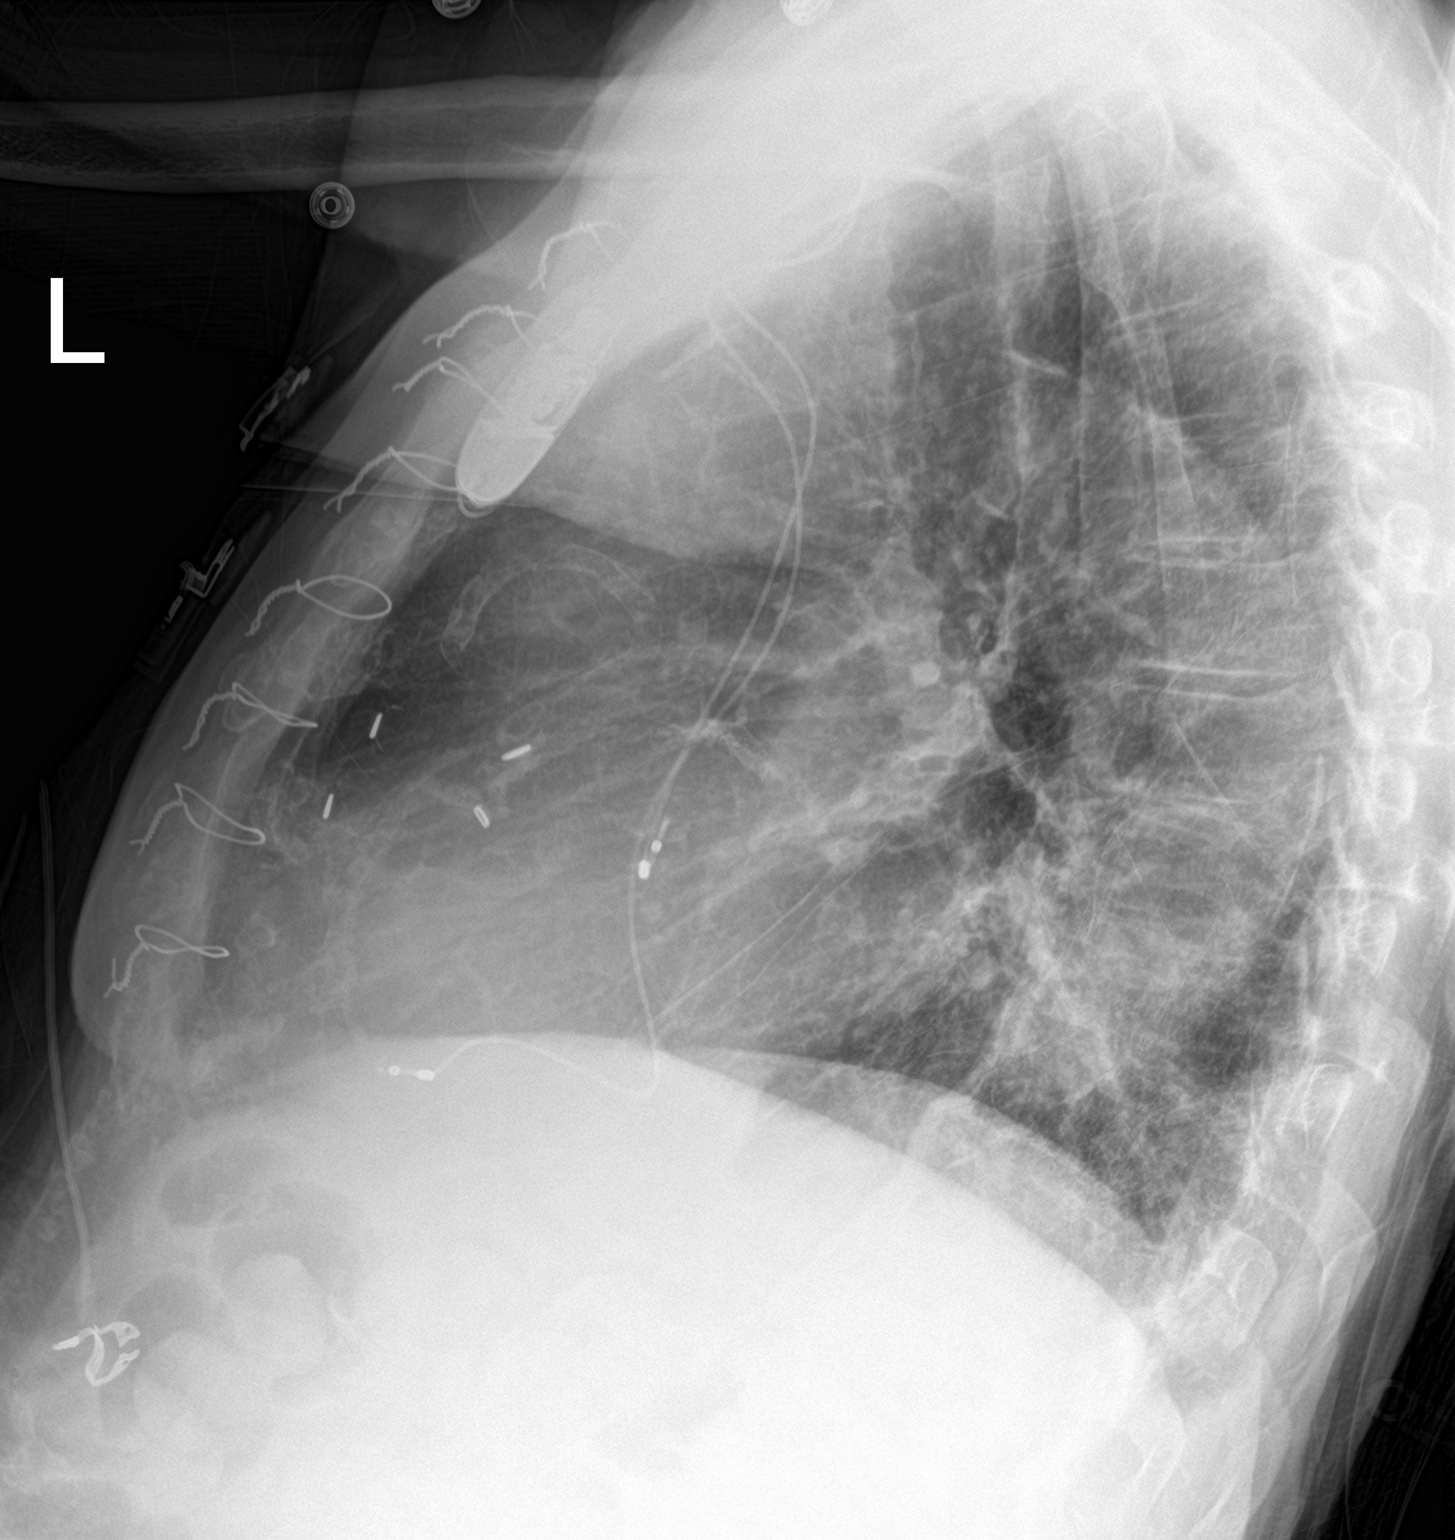

[chest ap]
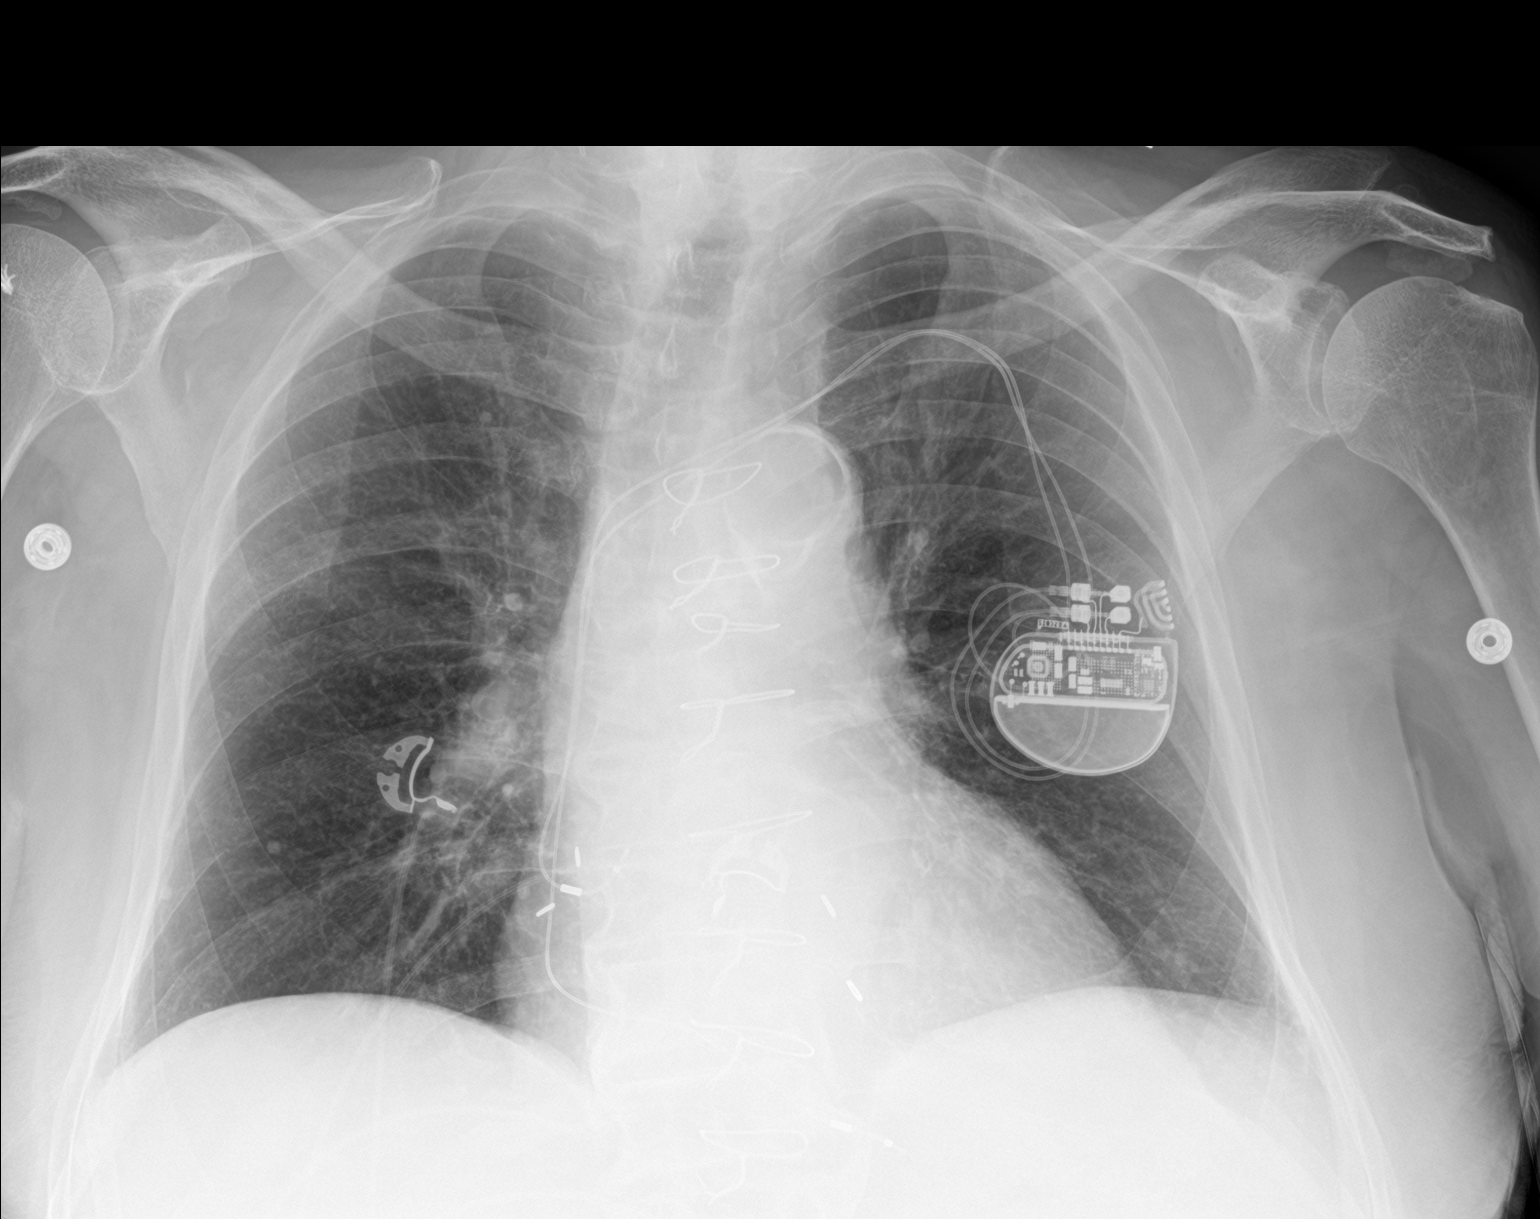

[2 of 2 positions shown; findings below may reference images not displayed]

FINDINGS: Unchanged left chest wall pacemaker. Stable cardiomediastinal
silhouette status post CABG. Atherosclerotic calcification of the
aortic arch. Normal pulmonary vascularity. No focal consolidation,
pleural effusion, or pneumothorax. No acute osseous abnormality.
IMPRESSION: No active cardiopulmonary disease.
